# Patient Record
Sex: Female | Born: 1945 | Race: Black or African American | Hispanic: No | Marital: Single | Smoking: Former smoker
Health system: Southern US, Community
[De-identification: ages and names within clinical notes are randomized; demographics above are authoritative.]

## PROBLEM LIST (undated history)

## (undated) ENCOUNTER — Emergency Department: Discharge status: Absent without leave | Payer: Medicare Other

## (undated) DIAGNOSIS — M79606 Pain in leg, unspecified: Secondary | ICD-10-CM

## (undated) DIAGNOSIS — D509 Iron deficiency anemia, unspecified: Secondary | ICD-10-CM

## (undated) DIAGNOSIS — R609 Edema, unspecified: Secondary | ICD-10-CM

## (undated) DIAGNOSIS — R0602 Shortness of breath: Secondary | ICD-10-CM

## (undated) DIAGNOSIS — E1169 Type 2 diabetes mellitus with other specified complication: Secondary | ICD-10-CM

## (undated) DIAGNOSIS — F419 Anxiety disorder, unspecified: Secondary | ICD-10-CM

## (undated) DIAGNOSIS — E785 Hyperlipidemia, unspecified: Secondary | ICD-10-CM

## (undated) DIAGNOSIS — M109 Gout, unspecified: Secondary | ICD-10-CM

## (undated) DIAGNOSIS — K759 Inflammatory liver disease, unspecified: Secondary | ICD-10-CM

## (undated) DIAGNOSIS — I10 Essential (primary) hypertension: Secondary | ICD-10-CM

## (undated) DIAGNOSIS — I6529 Occlusion and stenosis of unspecified carotid artery: Secondary | ICD-10-CM

## (undated) DIAGNOSIS — I739 Peripheral vascular disease, unspecified: Secondary | ICD-10-CM

## (undated) DIAGNOSIS — I2510 Atherosclerotic heart disease of native coronary artery without angina pectoris: Secondary | ICD-10-CM

## (undated) DIAGNOSIS — E669 Obesity, unspecified: Secondary | ICD-10-CM

## (undated) DIAGNOSIS — M1990 Unspecified osteoarthritis, unspecified site: Secondary | ICD-10-CM

## (undated) DIAGNOSIS — N183 Chronic kidney disease, stage 3 (moderate): Secondary | ICD-10-CM

## (undated) DIAGNOSIS — IMO0002 Reserved for concepts with insufficient information to code with codable children: Secondary | ICD-10-CM

## (undated) DIAGNOSIS — K5900 Constipation, unspecified: Secondary | ICD-10-CM

## (undated) HISTORY — DX: Atherosclerotic heart disease of native coronary artery without angina pectoris: I25.10

## (undated) HISTORY — DX: Reserved for concepts with insufficient information to code with codable children: IMO0002

## (undated) HISTORY — DX: Unspecified osteoarthritis, unspecified site: M19.90

## (undated) HISTORY — DX: Peripheral vascular disease, unspecified: I73.9

## (undated) HISTORY — DX: Essential (primary) hypertension: I10

## (undated) HISTORY — PX: OTHER SURGICAL HISTORY: SHX169

## (undated) HISTORY — PX: RETINAL DETACHMENT SURGERY: SHX105

## (undated) HISTORY — DX: Obesity, unspecified: E66.9

## (undated) HISTORY — DX: Occlusion and stenosis of unspecified carotid artery: I65.29

## (undated) HISTORY — DX: Type 2 diabetes mellitus with other specified complication: E11.69

## (undated) HISTORY — DX: Edema, unspecified: R60.9

## (undated) HISTORY — PX: BREAST EXCISIONAL BIOPSY: SUR124

## (undated) HISTORY — PX: BREAST SURGERY: SHX581

## (undated) HISTORY — PX: COLONOSCOPY W/ POLYPECTOMY: SHX1380

## (undated) HISTORY — DX: Chronic kidney disease, stage 3 (moderate): N18.3

## (undated) HISTORY — PX: EYE SURGERY: SHX253

## (undated) HISTORY — DX: Pain in leg, unspecified: M79.606

## (undated) HISTORY — DX: Hyperlipidemia, unspecified: E78.5

---

## 1997-11-24 ENCOUNTER — Other Ambulatory Visit: Admission: RE | Admit: 1997-11-24 | Discharge: 1997-11-24 | Payer: Self-pay | Admitting: Internal Medicine

## 1997-11-24 ENCOUNTER — Other Ambulatory Visit: Admission: RE | Admit: 1997-11-24 | Discharge: 1997-11-24 | Payer: Self-pay

## 1998-06-20 ENCOUNTER — Encounter: Payer: Self-pay | Admitting: Internal Medicine

## 1998-06-20 ENCOUNTER — Ambulatory Visit (HOSPITAL_COMMUNITY): Admission: RE | Admit: 1998-06-20 | Discharge: 1998-06-20 | Payer: Self-pay | Admitting: Internal Medicine

## 1998-11-25 ENCOUNTER — Other Ambulatory Visit: Admission: RE | Admit: 1998-11-25 | Discharge: 1998-11-25 | Payer: Self-pay | Admitting: Internal Medicine

## 1999-06-23 ENCOUNTER — Encounter: Payer: Self-pay | Admitting: Internal Medicine

## 1999-06-23 ENCOUNTER — Ambulatory Visit (HOSPITAL_COMMUNITY): Admission: RE | Admit: 1999-06-23 | Discharge: 1999-06-23 | Payer: Self-pay | Admitting: Internal Medicine

## 2000-07-04 ENCOUNTER — Encounter: Payer: Self-pay | Admitting: Internal Medicine

## 2000-07-04 ENCOUNTER — Ambulatory Visit (HOSPITAL_COMMUNITY): Admission: RE | Admit: 2000-07-04 | Discharge: 2000-07-04 | Payer: Self-pay | Admitting: Internal Medicine

## 2000-07-19 ENCOUNTER — Encounter: Admission: RE | Admit: 2000-07-19 | Discharge: 2000-07-19 | Payer: Self-pay | Admitting: Internal Medicine

## 2000-07-19 ENCOUNTER — Encounter: Payer: Self-pay | Admitting: Internal Medicine

## 2000-11-28 ENCOUNTER — Other Ambulatory Visit: Admission: RE | Admit: 2000-11-28 | Discharge: 2000-11-28 | Payer: Self-pay | Admitting: Internal Medicine

## 2001-07-07 ENCOUNTER — Encounter: Payer: Self-pay | Admitting: Internal Medicine

## 2001-07-07 ENCOUNTER — Encounter: Admission: RE | Admit: 2001-07-07 | Discharge: 2001-07-07 | Payer: Self-pay | Admitting: Internal Medicine

## 2001-12-08 ENCOUNTER — Other Ambulatory Visit: Admission: RE | Admit: 2001-12-08 | Discharge: 2001-12-08 | Payer: Self-pay | Admitting: Internal Medicine

## 2002-07-20 ENCOUNTER — Encounter: Admission: RE | Admit: 2002-07-20 | Discharge: 2002-07-20 | Payer: Self-pay | Admitting: Internal Medicine

## 2002-07-20 ENCOUNTER — Encounter: Payer: Self-pay | Admitting: Internal Medicine

## 2003-08-17 ENCOUNTER — Encounter: Admission: RE | Admit: 2003-08-17 | Discharge: 2003-08-17 | Payer: Self-pay | Admitting: Internal Medicine

## 2004-09-06 ENCOUNTER — Encounter: Admission: RE | Admit: 2004-09-06 | Discharge: 2004-09-06 | Payer: Self-pay | Admitting: Internal Medicine

## 2005-09-11 ENCOUNTER — Encounter: Admission: RE | Admit: 2005-09-11 | Discharge: 2005-09-11 | Payer: Self-pay | Admitting: Internal Medicine

## 2006-09-17 ENCOUNTER — Encounter: Admission: RE | Admit: 2006-09-17 | Discharge: 2006-09-17 | Payer: Self-pay | Admitting: Internal Medicine

## 2007-03-19 ENCOUNTER — Inpatient Hospital Stay (HOSPITAL_COMMUNITY): Admission: AD | Admit: 2007-03-19 | Discharge: 2007-03-22 | Payer: Self-pay | Admitting: Internal Medicine

## 2008-04-27 ENCOUNTER — Encounter: Admission: RE | Admit: 2008-04-27 | Discharge: 2008-04-27 | Payer: Self-pay | Admitting: Internal Medicine

## 2009-05-04 ENCOUNTER — Encounter: Admission: RE | Admit: 2009-05-04 | Discharge: 2009-05-04 | Payer: Self-pay | Admitting: Internal Medicine

## 2009-11-08 ENCOUNTER — Ambulatory Visit: Payer: Self-pay | Admitting: Vascular Surgery

## 2009-11-15 ENCOUNTER — Ambulatory Visit (HOSPITAL_COMMUNITY): Admission: RE | Admit: 2009-11-15 | Discharge: 2009-11-15 | Payer: Self-pay | Admitting: Surgery

## 2009-11-15 ENCOUNTER — Ambulatory Visit: Payer: Self-pay | Admitting: Surgery

## 2009-11-22 ENCOUNTER — Ambulatory Visit: Payer: Self-pay | Admitting: Vascular Surgery

## 2009-11-22 ENCOUNTER — Encounter: Admission: RE | Admit: 2009-11-22 | Discharge: 2009-11-22 | Payer: Self-pay | Admitting: Vascular Surgery

## 2009-11-28 HISTORY — PX: CARDIOVASCULAR STRESS TEST: SHX262

## 2009-11-29 ENCOUNTER — Ambulatory Visit: Payer: Self-pay | Admitting: Vascular Surgery

## 2009-11-29 ENCOUNTER — Encounter: Admission: RE | Admit: 2009-11-29 | Discharge: 2009-11-29 | Payer: Self-pay | Admitting: Cardiology

## 2009-12-01 ENCOUNTER — Ambulatory Visit: Payer: Self-pay | Admitting: Vascular Surgery

## 2009-12-01 ENCOUNTER — Encounter: Payer: Self-pay | Admitting: Cardiology

## 2009-12-01 ENCOUNTER — Inpatient Hospital Stay (HOSPITAL_COMMUNITY): Admission: RE | Admit: 2009-12-01 | Discharge: 2009-12-02 | Payer: Self-pay | Admitting: Cardiology

## 2009-12-01 ENCOUNTER — Encounter: Payer: Self-pay | Admitting: Cardiothoracic Surgery

## 2009-12-01 HISTORY — PX: CARDIAC CATHETERIZATION: SHX172

## 2009-12-01 HISTORY — PX: TRANSTHORACIC ECHOCARDIOGRAM: SHX275

## 2009-12-08 ENCOUNTER — Inpatient Hospital Stay (HOSPITAL_COMMUNITY)
Admission: RE | Admit: 2009-12-08 | Discharge: 2009-12-15 | Payer: Self-pay | Admitting: Thoracic Surgery (Cardiothoracic Vascular Surgery)

## 2009-12-08 HISTORY — PX: CORONARY ARTERY BYPASS GRAFT: SHX141

## 2009-12-19 ENCOUNTER — Ambulatory Visit: Payer: Self-pay | Admitting: Thoracic Surgery (Cardiothoracic Vascular Surgery)

## 2009-12-26 ENCOUNTER — Ambulatory Visit: Payer: Self-pay | Admitting: Thoracic Surgery (Cardiothoracic Vascular Surgery)

## 2010-02-06 ENCOUNTER — Ambulatory Visit: Payer: Self-pay | Admitting: Thoracic Surgery (Cardiothoracic Vascular Surgery)

## 2010-02-06 ENCOUNTER — Encounter
Admission: RE | Admit: 2010-02-06 | Discharge: 2010-02-06 | Payer: Self-pay | Admitting: Thoracic Surgery (Cardiothoracic Vascular Surgery)

## 2010-02-23 ENCOUNTER — Ambulatory Visit: Payer: Self-pay | Admitting: Cardiology

## 2010-03-17 ENCOUNTER — Ambulatory Visit: Payer: Self-pay | Admitting: Cardiology

## 2010-06-28 ENCOUNTER — Ambulatory Visit: Payer: Self-pay | Admitting: Cardiology

## 2010-07-29 ENCOUNTER — Other Ambulatory Visit: Payer: Self-pay | Admitting: Internal Medicine

## 2010-07-29 DIAGNOSIS — Z1231 Encounter for screening mammogram for malignant neoplasm of breast: Secondary | ICD-10-CM

## 2010-07-29 DIAGNOSIS — Z1239 Encounter for other screening for malignant neoplasm of breast: Secondary | ICD-10-CM

## 2010-08-22 ENCOUNTER — Ambulatory Visit: Payer: Self-pay

## 2010-08-30 DIAGNOSIS — Z01810 Encounter for preprocedural cardiovascular examination: Secondary | ICD-10-CM

## 2010-09-25 LAB — BASIC METABOLIC PANEL
BUN: 12 mg/dL (ref 6–23)
BUN: 22 mg/dL (ref 6–23)
CO2: 24 mEq/L (ref 19–32)
CO2: 26 mEq/L (ref 19–32)
CO2: 28 mEq/L (ref 19–32)
CO2: 28 mEq/L (ref 19–32)
CO2: 29 mEq/L (ref 19–32)
Calcium: 8.1 mg/dL — ABNORMAL LOW (ref 8.4–10.5)
Calcium: 8.3 mg/dL — ABNORMAL LOW (ref 8.4–10.5)
Calcium: 8.8 mg/dL (ref 8.4–10.5)
Calcium: 8.8 mg/dL (ref 8.4–10.5)
Chloride: 104 mEq/L (ref 96–112)
Chloride: 107 mEq/L (ref 96–112)
Chloride: 107 mEq/L (ref 96–112)
Chloride: 107 mEq/L (ref 96–112)
Chloride: 111 mEq/L (ref 96–112)
Chloride: 111 mEq/L (ref 96–112)
Chloride: 110 mEq/L (ref 96–112)
Creatinine, Ser: 0.82 mg/dL (ref 0.4–1.2)
Creatinine, Ser: 1.42 mg/dL — ABNORMAL HIGH (ref 0.4–1.2)
Creatinine, Ser: 1.5 mg/dL — ABNORMAL HIGH (ref 0.4–1.2)
Creatinine, Ser: 0.9 mg/dL (ref 0.4–1.2)
GFR calc Af Amer: 42 mL/min — ABNORMAL LOW (ref >60)
GFR calc Af Amer: 53 mL/min — ABNORMAL LOW (ref >60)
GFR calc Af Amer: 54 mL/min — ABNORMAL LOW (ref >60)
GFR calc Af Amer: >60 mL/min (ref >60)
GFR calc Af Amer: >60 mL/min (ref >60)
GFR calc non Af Amer: 30 mL/min — ABNORMAL LOW (ref >60)
GFR calc non Af Amer: >60 mL/min (ref >60)
Glucose, Bld: 127 mg/dL — ABNORMAL HIGH (ref 70–99)
Glucose, Bld: 158 mg/dL — ABNORMAL HIGH (ref 70–99)
Potassium: 4.4 mEq/L (ref 3.5–5.1)
Potassium: 4.9 mEq/L (ref 3.5–5.1)
Sodium: 138 mEq/L (ref 135–145)
Sodium: 141 mEq/L (ref 135–145)
Sodium: 142 mEq/L (ref 135–145)
Sodium: 143 mEq/L (ref 135–145)

## 2010-09-25 LAB — PROTIME-INR
INR: 0.99 (ref 0.00–1.49)
INR: 1.3 (ref 0.00–1.49)
Prothrombin Time: 13 seconds (ref 11.6–15.2)

## 2010-09-25 LAB — BLOOD GAS, ARTERIAL
Acid-Base Excess: 1.2 mmol/L (ref 0.0–2.0)
FIO2: 0.21 %
TCO2: 26.3 mmol/L (ref 0–100)
pCO2 arterial: 38.8 mmHg (ref 35.0–45.0)
pH, Arterial: 7.428 — ABNORMAL HIGH (ref 7.350–7.400)
pO2, Arterial: 82.7 mmHg (ref 80.0–100.0)

## 2010-09-25 LAB — HEPARIN LEVEL (UNFRACTIONATED): Heparin Unfractionated: 0.97 IU/mL — ABNORMAL HIGH (ref 0.30–0.70)

## 2010-09-25 LAB — CBC
HCT: 30 % — ABNORMAL LOW (ref 36.0–46.0)
HCT: 30.5 % — ABNORMAL LOW (ref 36.0–46.0)
HCT: 33.8 % — ABNORMAL LOW (ref 36.0–46.0)
Hemoglobin: 10.2 g/dL — ABNORMAL LOW (ref 12.0–15.0)
Hemoglobin: 10.2 g/dL — ABNORMAL LOW (ref 12.0–15.0)
Hemoglobin: 10.6 g/dL — ABNORMAL LOW (ref 12.0–15.0)
MCHC: 33.9 g/dL (ref 30.0–36.0)
MCHC: 34 g/dL (ref 30.0–36.0)
MCHC: 34.4 g/dL (ref 30.0–36.0)
MCV: 88.4 fL (ref 78.0–100.0)
MCV: 89.8 fL (ref 78.0–100.0)
MCV: 90.6 fL (ref 78.0–100.0)
MCV: 91.2 fL (ref 78.0–100.0)
Platelets: 165 10*3/uL (ref 150–400)
Platelets: 194 10*3/uL (ref 150–400)
Platelets: 292 10*3/uL (ref 150–400)
Platelets: 368 10*3/uL (ref 150–400)
RBC: 3.5 MIL/uL — ABNORMAL LOW (ref 3.87–5.11)
RBC: 3.76 MIL/uL — ABNORMAL LOW (ref 3.87–5.11)
RBC: 3.9 MIL/uL (ref 3.87–5.11)
RDW: 15.3 % (ref 11.5–15.5)
RDW: 15.9 % — ABNORMAL HIGH (ref 11.5–15.5)
RDW: 15.9 % — ABNORMAL HIGH (ref 11.5–15.5)
WBC: 14 10*3/uL — ABNORMAL HIGH (ref 4.0–10.5)
WBC: 14.8 10*3/uL — ABNORMAL HIGH (ref 4.0–10.5)
WBC: 13.7 10*3/uL — ABNORMAL HIGH (ref 4.0–10.5)

## 2010-09-25 LAB — GLUCOSE, CAPILLARY
Glucose-Capillary: 111 mg/dL — ABNORMAL HIGH (ref 70–99)
Glucose-Capillary: 117 mg/dL — ABNORMAL HIGH (ref 70–99)
Glucose-Capillary: 117 mg/dL — ABNORMAL HIGH (ref 70–99)
Glucose-Capillary: 126 mg/dL — ABNORMAL HIGH (ref 70–99)
Glucose-Capillary: 132 mg/dL — ABNORMAL HIGH (ref 70–99)
Glucose-Capillary: 134 mg/dL — ABNORMAL HIGH (ref 70–99)
Glucose-Capillary: 135 mg/dL — ABNORMAL HIGH (ref 70–99)
Glucose-Capillary: 145 mg/dL — ABNORMAL HIGH (ref 70–99)
Glucose-Capillary: 151 mg/dL — ABNORMAL HIGH (ref 70–99)
Glucose-Capillary: 158 mg/dL — ABNORMAL HIGH (ref 70–99)
Glucose-Capillary: 166 mg/dL — ABNORMAL HIGH (ref 70–99)
Glucose-Capillary: 168 mg/dL — ABNORMAL HIGH (ref 70–99)
Glucose-Capillary: 211 mg/dL — ABNORMAL HIGH (ref 70–99)
Glucose-Capillary: 81 mg/dL (ref 70–99)
Glucose-Capillary: 94 mg/dL (ref 70–99)
Glucose-Capillary: 112 mg/dL — ABNORMAL HIGH (ref 70–99)
Glucose-Capillary: 123 mg/dL — ABNORMAL HIGH (ref 70–99)
Glucose-Capillary: 247 mg/dL — ABNORMAL HIGH (ref 70–99)
Glucose-Capillary: 265 mg/dL — ABNORMAL HIGH (ref 70–99)

## 2010-09-25 LAB — POCT I-STAT 3, ART BLOOD GAS (G3+)
Acid-Base Excess: 2 mmol/L (ref 0.0–2.0)
Acid-base deficit: 2 mmol/L (ref 0.0–2.0)
Acid-base deficit: 2 mmol/L (ref 0.0–2.0)
Acid-base deficit: 3 mmol/L — ABNORMAL HIGH (ref 0.0–2.0)
Bicarbonate: 24 mEq/L (ref 20.0–24.0)
Bicarbonate: 25 mEq/L — ABNORMAL HIGH (ref 20.0–24.0)
O2 Saturation: 100 %
O2 Saturation: 91 %
Patient temperature: 36.5
Patient temperature: 36.6
TCO2: 23 mmol/L (ref 0–100)
TCO2: 26 mmol/L (ref 0–100)
pCO2 arterial: 41.2 mmHg (ref 35.0–45.0)
pCO2 arterial: 46.2 mmHg — ABNORMAL HIGH (ref 35.0–45.0)
pCO2 arterial: 47.5 mmHg — ABNORMAL HIGH (ref 35.0–45.0)
pH, Arterial: 7.336 — ABNORMAL LOW (ref 7.350–7.400)
pO2, Arterial: 321 mmHg — ABNORMAL HIGH (ref 80.0–100.0)
pO2, Arterial: 59 mmHg — ABNORMAL LOW (ref 80.0–100.0)
pO2, Arterial: 64 mmHg — ABNORMAL LOW (ref 80.0–100.0)
pO2, Arterial: 67 mmHg — ABNORMAL LOW (ref 80.0–100.0)

## 2010-09-25 LAB — URINE MICROSCOPIC-ADD ON

## 2010-09-25 LAB — POCT I-STAT 4, (NA,K, GLUC, HGB,HCT)
Glucose, Bld: 121 mg/dL — ABNORMAL HIGH (ref 70–99)
Glucose, Bld: 136 mg/dL — ABNORMAL HIGH (ref 70–99)
Glucose, Bld: 167 mg/dL — ABNORMAL HIGH (ref 70–99)
HCT: 20 % — ABNORMAL LOW (ref 36.0–46.0)
HCT: 23 % — ABNORMAL LOW (ref 36.0–46.0)
HCT: 27 % — ABNORMAL LOW (ref 36.0–46.0)
HCT: 28 % — ABNORMAL LOW (ref 36.0–46.0)
Hemoglobin: 6.8 g/dL — CL (ref 12.0–15.0)
Hemoglobin: 7.8 g/dL — ABNORMAL LOW (ref 12.0–15.0)
Hemoglobin: 9.2 g/dL — ABNORMAL LOW (ref 12.0–15.0)
Potassium: 3.6 mEq/L (ref 3.5–5.1)
Potassium: 3.7 mEq/L (ref 3.5–5.1)
Potassium: 4 mEq/L (ref 3.5–5.1)
Potassium: 4 mEq/L (ref 3.5–5.1)
Potassium: 5.1 mEq/L (ref 3.5–5.1)
Sodium: 136 mEq/L (ref 135–145)

## 2010-09-25 LAB — APTT
aPTT: 26 seconds (ref 24–37)
aPTT: 31 seconds (ref 24–37)

## 2010-09-25 LAB — URINALYSIS, ROUTINE W REFLEX MICROSCOPIC
Hgb urine dipstick: NEGATIVE
Nitrite: NEGATIVE
Urobilinogen, UA: 0.2 mg/dL (ref 0.0–1.0)
pH: 5.5 (ref 5.0–8.0)

## 2010-09-25 LAB — CREATININE, SERUM
Creatinine, Ser: 0.77 mg/dL (ref 0.4–1.2)
GFR calc Af Amer: >60 mL/min (ref >60)
GFR calc non Af Amer: >60 mL/min (ref >60)

## 2010-09-25 LAB — POCT I-STAT, CHEM 8
BUN: 15 mg/dL (ref 6–23)
Calcium, Ion: 1.15 mmol/L (ref 1.12–1.32)
HCT: 35 % — ABNORMAL LOW (ref 36.0–46.0)
Hemoglobin: 11.9 g/dL — ABNORMAL LOW (ref 12.0–15.0)
Sodium: 141 mEq/L (ref 135–145)
TCO2: 22 mmol/L (ref 0–100)

## 2010-09-25 LAB — TYPE AND SCREEN: ABO/RH(D): A POS

## 2010-09-25 LAB — URINALYSIS, MICROSCOPIC ONLY
Glucose, UA: NEGATIVE mg/dL
Hgb urine dipstick: NEGATIVE
Ketones, ur: NEGATIVE mg/dL
Protein, ur: NEGATIVE mg/dL
pH: 5.5 (ref 5.0–8.0)

## 2010-09-25 LAB — COMPREHENSIVE METABOLIC PANEL
AST: 21 U/L (ref 0–37)
Calcium: 9.3 mg/dL (ref 8.4–10.5)
Creatinine, Ser: 0.82 mg/dL (ref 0.4–1.2)
Sodium: 140 mEq/L (ref 135–145)

## 2010-09-25 LAB — HEMOGLOBIN A1C
Hgb A1c MFr Bld: 7.2 % — ABNORMAL HIGH (ref <5.7)
Mean Plasma Glucose: 160 mg/dL — ABNORMAL HIGH (ref <117)

## 2010-09-25 LAB — MAGNESIUM: Magnesium: 2.9 mg/dL — ABNORMAL HIGH (ref 1.5–2.5)

## 2010-09-25 LAB — ABO/RH: ABO/RH(D): A POS

## 2010-09-25 LAB — SURGICAL PCR SCREEN
MRSA, PCR: NEGATIVE
Staphylococcus aureus: NEGATIVE

## 2010-09-26 LAB — POCT I-STAT, CHEM 8
BUN: 37 mg/dL — ABNORMAL HIGH (ref 6–23)
Chloride: 111 mEq/L (ref 96–112)
HCT: 33 % — ABNORMAL LOW (ref 36.0–46.0)
Sodium: 143 mEq/L (ref 135–145)
TCO2: 24 mmol/L (ref 0–100)

## 2010-10-10 ENCOUNTER — Ambulatory Visit: Payer: Self-pay

## 2010-11-01 ENCOUNTER — Ambulatory Visit
Admission: RE | Admit: 2010-11-01 | Discharge: 2010-11-01 | Disposition: A | Discharge status: Home / Self Care | Payer: BC Managed Care – PPO | Source: Ambulatory Visit | Attending: Internal Medicine | Admitting: Internal Medicine

## 2010-11-01 DIAGNOSIS — Z1231 Encounter for screening mammogram for malignant neoplasm of breast: Secondary | ICD-10-CM

## 2010-11-02 DIAGNOSIS — Z0271 Encounter for disability determination: Secondary | ICD-10-CM

## 2010-11-21 NOTE — Assessment & Plan Note (Signed)
OFFICE VISIT   Norma [name], Norma [name]  DOB:  [birth year]                                        December 19, 2009  CHART #:  [id number]   HISTORY:  The patient is a 65 year old morbidly obese black female who  recently underwent coronary artery bypass grafting x2 on December 08, 2009,  for left main coronary artery disease.  She had an endoscopic vein  harvest from the right leg and during her hospitalization, the wound  became dehisced requiring resuture.  We brought her into the office  today to check on the healing of this and her overall recovery.  She had  some bronchitis during her postoperative course and was sent home on  some oral antibiotics.  Currently, she reports no particular  difficulties related to the incisions.  She does have significant  bilateral lower extremity swelling.  She complains of some ongoing back  discomfort.   PHYSICAL EXAMINATION:  Vital Signs:  Blood pressure 164/60, pulse 70,  respirations 16, oxygen saturation is 93% on room air.  Pulmonary:  Clear breath sounds throughout without wheeze.  Cardiac:  Regular rate  and rhythm.  No murmurs, gallops or rubs.  Incisions are healing well  without evidence of infection.  Extremities:  Bilateral lower extremity  pitting edema.   ASSESSMENT:  This visit was primarily for the wound check.  These  incisions are healing well.  She was also felt to be recovering well  from her bronchitis.  She had significant wheezing during her  hospitalization but none currently and has them had minimal at home.  We  will continue her b.i.d. Lasix 40 mg.  I removed the sutures from the  chest tube sites and they appear to be healing well.  I will leave the  sutures in the right endoscopic vein harvest site one additional week  and  we will see her in the office and at that time probably discontinue them  if it appears to be healed adequately.   John Giovanni, P.A.-C.   [name]  D:  12/19/2009  T:  12/20/2009   Job:  [id number]   cc:   Valentina Gu. Roxy Manns, M.D.  Peter M. Martinique, M.D.

## 2010-11-21 NOTE — Consult Note (Signed)
NEW PATIENT CONSULTATION   Norma [name], Norma [name]  DOB:  May 04, 1946                                       11/08/2009  [id number]   This is a new patient consultation.  The patient is a 65 year old female  referred by Dr. Rhona Raider for severe claudication symptoms left leg worse  than right.  She has been seeing Dr. Rhona Raider for degenerative joint  disease in her knees and has had injections earlier today in both knees.  I have reviewed his records dating back a few months this year relative  to her joint disease.  She obtained lower extremity arterial Doppler  studies by [facility] and I reviewed this study today and this  reveals aortoiliac, femoral, popliteal and tibial occlusive disease in  both lower extremities left worse than right with an ABI of 0.28 on the  left and 0.65 on the right.  She states that she is only able to  ambulate about a quarter of a block before having to stop because of  pain in her left calf soon to be followed by pain in the right calf.  She can rest for a few minutes and then proceed but then quickly has to  stop again.  She occasionally has some rest discomfort in her left leg  and also some numbness in both feet left worse than right.  She has no  history of nonhealing ulcers, infection or gangrene.  She also has pain  in her lower back when she walks but the leg pain is more limiting than  the back pain.   CHRONIC MEDICAL PROBLEMS:  1. Type 1 diabetes mellitus.  2. Hypertension.  3. Hyperlipidemia.  4. 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  5. Negative for coronary artery disease, COPD or stroke.   FAMILY HISTORY:  Negative for coronary artery disease, diabetes and  stroke.   SOCIAL HISTORY:  She is single, has two children, works as a Company secretary.  She has not smoked since 1993.  Does not use alcohol.   REVIEW OF SYSTEMS:  Negative for chest pain, dyspnea on exertion, PND,  orthopnea, bronchitis, hemoptysis, wheezing.   No GI symptoms or GU  symptoms.  Does have arthritis and joint pain as noted.  No history of  DVT or thrombophlebitis.  All other systems in review of systems are  negative.   PHYSICAL EXAM:  Vital signs:  Blood pressure 180/72, heart rate 56,  respirations 16.  General:  She is an obese middle-aged female who is in  no apparent distress.  She is alert, oriented x3.  HEENT:  Exam normal.  EOMs intact.  Chest:  Is clear to auscultation.  No rhonchi or wheezing.  Cardiovascular:  Reveals regular rhythm.  No murmurs.  Carotid pulses  3+.  No audible bruits.  There is no distal edema.  Abdomen:  Soft and  obese.  No palpable masses.  Musculoskeletal:  Exam free of major  deformities.  Neurologic:  Exam is normal.  Slight decreased sensation  in the feet.  Skin:  Free of rashes.  Lower extremities:  Exam reveals  one to two plus femoral pulses bilaterally.  No popliteal or distal  pulses.   Today I have reviewed her arterial studies performed at [place]  and feel that she does have multilevel disease which is limiting her  quite  severely.  We will proceed with an angiogram to be performed by  Dr. Trula Slade on May 10 and if PTA or stenting is feasible then he will  proceed at that time.  If not we will then discuss what options are  available to help relieve this lady's symptoms.     Norma [name], M.D.  Electronically Signed   JDL/MEDQ  D:  11/08/2009  T:  11/09/2009  Job:  [id number]   cc:   [name], M.D.  [name], M.D.

## 2010-11-21 NOTE — Assessment & Plan Note (Signed)
OFFICE VISIT   KACHINA, JANUSZEWSKI  DOB:  08-10-1945                                        February 06, 2010  CHART #:  [id number]   HISTORY:  The patient returns for follow up of coronary artery disease.  She underwent coronary artery bypass grafting x2 on December 08, 2009, for  left main disease.  She has numerous medical problems that have been  outlined at length previously.  Despite her issues, the patient is doing  remarkably well.  She is being followed carefully by Dr. Martinique and Dr.  Karlton Lemon who attend to her long-term medical needs.  She returns to our  office for further followup today.  She looks great and feels well.  The  patient has spent a fair amount of time working on her diet to help her  continue to lose weight.  Her exercise tolerance has continued to  gradually improve ever since surgery.  She has no longer had any  significant soreness in her chest.  She is not having shortness of  breath.  She claims that her blood sugars have been under fairly good  control.  She has no complaints.  She does still content to right lower  leg saphenous vein harvest wound.  This incision separated early  postoperatively and has been left to heal by secondary intent.  She has  not had any problems with infection or purulent drainage at all, and she  is to dressing her wound daily keeping it clean and packing it with  gauze.  She has not had any problems with this.   PHYSICAL EXAMINATION:  Notable for well-appearing morbidly obese female  with blood pressure 181/73, pulse 64, oxygen saturation 96% on room air.  Examination of the chest demonstrates a median sternotomy scar that is  healing nicely.  The sternum is stable on palpation.  Breath sounds are  clear to auscultation and symmetrical bilaterally.  No wheezes, rales,  or rhonchi are noted.  Cardiovascular exam includes regular rate and  rhythm.  No murmurs, rubs, or gallops are appreciated.  The abdomen is  soft, nontender.  The extremities are warm and well perfused.  The right  lower leg endoscopic saphenous vein harvest incision is open and  granulating slowly.  The incision has contracted a little bit since I  last examined it.  There is no surrounding cellulitis and the wound  itself is quite clean.  The remainder of her physical exam is  unremarkable.   IMPRESSION:  Overall, the patient has done remarkably well following  coronary artery bypass grafting.  She does have very slowly healing open  wound on her right lower leg from endoscopic vein harvest.  There is no  sign of any wound infection, and the patient is doing well with local  wound care.  Her multiple medical problems are being managed quite  aggressively by Dr. Karlton Lemon and Dr. Martinique.   PLAN:  We will plan to see the patient back in 8 weeks for follow up to  check her right lower leg wound.  All of her questions have been  addressed.   Valentina Gu. Roxy Manns, M.D.  Electronically Signed   CHO/MEDQ  D:  02/06/2010  T:  02/07/2010  Job:  [id number]   cc:   Peter M. Martinique, M.D.  [name],  M.D.  Nelda Severe. Kellie Simmering, M.D.

## 2010-11-21 NOTE — Assessment & Plan Note (Signed)
OFFICE VISIT   Norma [name], Norma [name]  DOB:  05/10/1946                                       11/29/2009  [id number]   The patient returns today to further discuss surgical options for her  severe claudication symptoms in the left leg which limit her walking  about 20-30 yards.  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  We have thoroughly reviewed these studies and I have discussed  it with multiple associates and all agree that she needs endarterectomy  of her suprarenal aorta from the celiac axis down to below the renal  arteries and aortobifemoral bypass graft to relieve her symptoms.  She  had a Cardiolite performed by Dr. Peter Martinique a few days ago which is  abnormal and he will be evaluating her today for possible cardiac cath  in the next day or two to see if we can proceed with the surgery on this  Friday May 27.  She denies any chest pain but does have mild dyspnea on  exertion.  She has no history of coronary artery disease or myocardial  infarction.  She does not have rest pain at the present time in the left  foot but does have some tingling on occasion.  She did develop a  contrast reaction after the CT scan last week with some itching and has  been taking Benadryl for that which has dramatically improved her  symptoms.   PHYSICAL EXAMINATION:  Blood pressure 166/50, heart rate 61 and  temperature 98.5.  Her abdomen is obese.  She has absent femoral pulses.  Chest clear to auscultation.   We will await the results of the cardiology evaluation by Dr. Martinique  later today and the results of the cardiac cath to be done in the next  day or two to determine the timing of her surgery.     Nelda Severe Kellie Simmering, M.D.  Electronically Signed   JDL/MEDQ  D:  11/29/2009  T:  11/30/2009  Job:  [id number]

## 2010-11-21 NOTE — Discharge Summary (Signed)
NAME:  VENBA, SAMADI                ACCOUNT NO.:  [id number]   MEDICAL RECORD NO.:  [id number]          PATIENT TYPE:  INP   LOCATION:  5506                         FACILITY:  [facility]   PHYSICIAN:  Cyril Mourning, D.O.    DATE OF BIRTH:  1946/05/01   DATE OF ADMISSION:  03/19/2007  DATE OF DISCHARGE:  03/22/2007                               DISCHARGE SUMMARY   PRIMARY CARE PHYSICIAN:  Dr. Willey Blade   FINAL DIAGNOSIS:  Diabetic ketoacidosis.   ADDITIONAL DIAGNOSES:  1. Acute renal failure, resolved, secondary to volume depletion and      dehydration.  2. Nausea, vomiting, resolved, status post resolution of the above.  3. Poorly controlled diabetes.  Her hemoglobin A1c was unable to be      calculated.  I would recommend checking this in the outpatient      setting once she has stabilized on her new regimen.   MEDICATIONS ON DISCHARGE:  1. She has achieved stable control on a regimen of Lantus 34 units      b.i.d., though still some continued hyperglycemia in the 200 range.      She is being discharged on an increased dose of Lantus at 36 units      subcu b.i.d.  2. In addition, she will cover her meals with NovoLog 4 units q.a.c.      This is by no means a final dosage for Ms. Lobosco.  It is      anticipated she will need fine tuning and adjustment in the      outpatient setting including diabetes education.  3. We are, for now, discontinuing her Diovan due to recent acute renal      failure.  4. She can resume her lisinopril at 20 mg daily with recommendations      to have a followup basic metabolic panel within the next month by      her primary care physician to assure that her creatinine remains      stable.  5. Lipitor 10 mg daily.  6. We have asked her to discontinue Byetta and Humulin-N for now.  7. Naprosyn.  It was recommended that she not combine this medication      with lisinopril or Diovan, as this also compounds and increases her      risk of going into acute  renal failure.  8. Amaryl, we are discontinuing now as a potential for hypoglycemia,      especially in the adjustment period for her outpatient insulin      regimen.   DISPOSITION:  Atr this time, Ms. Causer is felt to be medically stable  for transition home.  She is hemodynamically and medically stable.   LABORATORY STUDIES DURING THIS HOSPITALIZATION:  Upon arrival, Ms.  Steimle blood sugar was 1045, and she had an anion gap metabolic  acidosis with a corrected sodium of 151, chloride of 85, and a CO2 of  19, and her creatinine was 2.5.  This subsequently responded to IV  hydration with a final BUN of 44 and a creatinine of 1.27, sodium 138,  potassium 4.2.  Her hemoglobin was 11, WBC 10, and platelet count was  192.  Her hemoglobin A1c as noted above could not be calculated.  Her  next EKG revealed a normal sinus rhythm.  There was some diffuse T-wave  inversions.  Cardiac markers were not suggestive of an acute ischemic  event.   I would recommend that Ms. Batrez undergo an outpatient risk  stratification study, including a stress test which can be arranged by  her primary care physician in the outpatient setting.   HISTORY OF PRESENTING ILLNESS:  Ms. Rubert was admitted directly to 6700  from her primary care physician's office after having presented to the  clinic with abdominal discomfort that has been persisting for about a  week.  She had had some nausea and vomiting and was unable to keep down  solid foods or liquids at the clinic.  She was noted to have an elevated  glucose of 500.  She was provided with 20 units of NovoLog and  transferred directly to the 6700 unit.   HOSPITAL COURSE:  Initial laboratories were suggestive of a diabetic  ketoacidosis with anion gap greater than 40 with sodium corrected for  glucose of 151.  In any event, she was placed on a Glucomander and  transferred to the step-down unit where she received an insulin drip  until anion gap closure.   This continued, and her renal function  improved steadily, and she was started on Lantus and NovoLog regimen for  simplicity of regimen which she can transition to the home setting, and  I am recommending that she undergo further outpatient titration of her  insulin therapy and undergo diabetes education in the outpatient  setting.      Cyril Mourning, D.O.  Electronically Signed     ESS/MEDQ  D:  03/22/2007  T:  03/22/2007  Job:  [id number]   cc:   [name], M.D.

## 2010-11-21 NOTE — Assessment & Plan Note (Signed)
OFFICE VISIT   REYES, ROSKELLEY  DOB:  [birth year]                                       11/22/2009  [id number]   The patient returns today for further discussion regarding her severe  lower extremity vascular occlusive disease and severe claudication, left  worse than right.  I evaluated her on May 3 for this problem.  She had  an angiogram performed last week by Dr. Trula Slade which I have reviewed.  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  She  does need an inflow procedure but this is complicated by the proximal  extension of the disease and her obesity which will make exposure even  more difficult.  She is not having rest pain or history of nonhealing  ulcers at this time.   CHRONIC MEDICAL PROBLEMS:  1. Type 1 diabetes mellitus.  2. Hypertension.  3. Hyperlipidemia.  4. 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  5. Negative for coronary artery disease, COPD or stroke.   FAMILY HISTORY:  Negative for coronary artery disease, diabetes and  stroke.   SOCIAL HISTORY:  She quit smoking in '93, does not use alcohol.   REVIEW OF SYSTEMS:  Negative for chest pain, dyspnea on exertion.  Does  have severe claudication at a quarter of a block.  No bronchitis,  hemoptysis, wheezing.  Denies any GI or GU symptoms.  Does have  arthritis and joint pain.   PHYSICAL EXAMINATION:  Vital signs:  Blood pressure is 140/65, heart  rate 56, temperature 98.  General:  She is an obese [place] American  female who is in no apparent distress.  She is alert and oriented x3.  Neck:  Supple, 3+ carotid  pulses palpable.  Chest:  Clear to  auscultation.  HEENT:  Exam is normal.  EOMs intact.  Cardiovascular:  Regular rhythm.  No murmurs.  Abdomen:  Obese.  No palpable masses.  She  has absent femoral pulses bilaterally with absent distal pulses.   She also has a CT angiogram performed by [facility] which I  reviewed today to look at her runoff which as I noted reveals she does  have patent superficial femoral and popliteal arteries bilaterally.   This is a very complex situation which will need aortobifemoral bypass  grafting and possible treatment of the suprarenal aorta to some degree.  We will obtain a Cardiolite study to stratify her cardiac risk.  She  will return in 1 week for further discussion.     Nelda Severe Kellie Simmering, M.D.  Electronically Signed   JDL/MEDQ  D:  11/22/2009  T:  11/23/2009  Job:  [id number]   cc:   [name], M.D.

## 2010-11-21 NOTE — Assessment & Plan Note (Signed)
OFFICE VISIT   Norma [name], Norma [name]  DOB:  1945-08-11                                        December 26, 2009  CHART #:  [id number]   HISTORY:  The patient comes in today for a wound recheck and suture  removal.  She is status post coronary artery bypass grafting x2 on December 08, 2009.  Her right lower extremity EVH site dehisced postoperatively,  requiring resuturing.  She was seen in our office 1 week ago and  everything was healing well.  She was still edematous and was continued  on Lasix 40 mg b.i.d. for an additional week.  Since her last visit, she  has not really been very mobile.  According to the patient and her  daughter, she is walking minimally, but is keeping her legs elevated.  She continues to have significant lower extremity edema, although she  does feel like her weight is slowly trending downward.  She is having no  chest pain or shortness of breath.  She has an appointment with Dr.  Martinique in 1 week.  She still requires the pain medication at times  during the day and at night to help her rest.   PHYSICAL EXAMINATION:  Vital Signs:  Blood pressure is 148/64, pulse 67,  respirations 18, and O2 sat 93% on room air.  Chest:  Her sternal and  chest tube incisions have all healed well.  Sternum is stable to  palpation.  Heart:  Regular rate and rhythm without murmurs, rubs, or  gallops.  Lungs:  Clear, although slightly diminished breath sounds in  the bases bilaterally.  Lower Extremities:  Persistent pitting edema  bilaterally.  Her right lower extremity EVH site is weeping a small  amount of serous fluid.  The wound is approximating and the sutures are  removed.  There is no surrounding erythema.   ASSESSMENT/PLAN:  The patient is stable status post coronary artery  bypass graft x2.  Dr. Roxy Manns looked her wound today and spoke with the  patient while she was in the office.  We will plan to keep her on Lasix  40 mg b.i.d. indefinitely and I have given  her a prescription for the  Lasix as well as potassium 20 mEq b.i.d. 1 month supply with 1 refill of  each.  Also, she has requested a refill on oxycodone and I have given  her #40 tablets with no refills.  Dr. Roxy Manns would like to see her back in  6 weeks for followup with a chest x-ray and she may call in the interim  if she experiences any problems or has questions.  She will follow up as  directed with Dr. Martinique.  We have encouraged her to increase her activity and ambulate more  frequently and to try to increase the amount of walking she does daily.   Valentina Gu. Roxy Manns, M.D.  Electronically Signed   GC/MEDQ  D:  12/26/2009  T:  12/27/2009  Job:  [id number]   cc:   Peter M. Martinique, M.D.

## 2011-02-20 ENCOUNTER — Inpatient Hospital Stay (HOSPITAL_COMMUNITY)
Admission: EM | Admit: 2011-02-20 | Discharge: 2011-02-22 | DRG: 305 | Disposition: A | Discharge status: Home / Self Care | Payer: Medicare Other | Attending: Internal Medicine | Admitting: Internal Medicine

## 2011-02-20 DIAGNOSIS — E119 Type 2 diabetes mellitus without complications: Secondary | ICD-10-CM | POA: Diagnosis present

## 2011-02-20 DIAGNOSIS — D509 Iron deficiency anemia, unspecified: Secondary | ICD-10-CM | POA: Diagnosis present

## 2011-02-20 DIAGNOSIS — Z951 Presence of aortocoronary bypass graft: Secondary | ICD-10-CM

## 2011-02-20 DIAGNOSIS — Z794 Long term (current) use of insulin: Secondary | ICD-10-CM

## 2011-02-20 DIAGNOSIS — Z23 Encounter for immunization: Secondary | ICD-10-CM

## 2011-02-20 DIAGNOSIS — I739 Peripheral vascular disease, unspecified: Secondary | ICD-10-CM | POA: Diagnosis present

## 2011-02-20 DIAGNOSIS — Z79899 Other long term (current) drug therapy: Secondary | ICD-10-CM

## 2011-02-20 DIAGNOSIS — Z7982 Long term (current) use of aspirin: Secondary | ICD-10-CM

## 2011-02-20 DIAGNOSIS — Z91041 Radiographic dye allergy status: Secondary | ICD-10-CM

## 2011-02-20 DIAGNOSIS — E785 Hyperlipidemia, unspecified: Secondary | ICD-10-CM | POA: Diagnosis present

## 2011-02-20 DIAGNOSIS — I10 Essential (primary) hypertension: Principal | ICD-10-CM | POA: Diagnosis present

## 2011-02-20 DIAGNOSIS — I498 Other specified cardiac arrhythmias: Secondary | ICD-10-CM | POA: Diagnosis present

## 2011-02-20 DIAGNOSIS — Z882 Allergy status to sulfonamides status: Secondary | ICD-10-CM

## 2011-02-20 DIAGNOSIS — I2510 Atherosclerotic heart disease of native coronary artery without angina pectoris: Secondary | ICD-10-CM | POA: Diagnosis present

## 2011-02-20 LAB — DIFFERENTIAL
Basophils Relative: 0 % (ref 0–1)
Eosinophils Absolute: 0.1 10*3/uL (ref 0.0–0.7)
Monocytes Absolute: 0.4 10*3/uL (ref 0.1–1.0)
Monocytes Relative: 6 % (ref 3–12)

## 2011-02-20 LAB — COMPREHENSIVE METABOLIC PANEL
ALT: 24 U/L (ref 0–35)
AST: 22 U/L (ref 0–37)
Calcium: 9.9 mg/dL (ref 8.4–10.5)
GFR calc Af Amer: >60 mL/min (ref >60)
Sodium: 142 mEq/L (ref 135–145)
Total Protein: 7.1 g/dL (ref 6.0–8.3)

## 2011-02-20 LAB — CBC
MCH: 27 pg (ref 26.0–34.0)
MCHC: 31.5 g/dL (ref 30.0–36.0)
Platelets: 293 10*3/uL (ref 150–400)

## 2011-02-20 LAB — CK TOTAL AND CKMB (NOT AT ARMC): Relative Index: 2.8 — ABNORMAL HIGH (ref 0.0–2.5)

## 2011-02-20 LAB — GLUCOSE, CAPILLARY: Glucose-Capillary: 199 mg/dL — ABNORMAL HIGH (ref 70–99)

## 2011-02-21 LAB — CK TOTAL AND CKMB (NOT AT ARMC)
CK, MB: 2.9 ng/mL (ref 0.3–4.0)
Relative Index: 2.8 — ABNORMAL HIGH (ref 0.0–2.5)
Total CK: 104 U/L (ref 7–177)
Total CK: 99 U/L (ref 7–177)

## 2011-02-21 LAB — MRSA PCR SCREENING: MRSA by PCR: NEGATIVE

## 2011-02-21 LAB — GLUCOSE, CAPILLARY
Glucose-Capillary: 81 mg/dL (ref 70–99)
Glucose-Capillary: 70 mg/dL (ref 70–99)
Glucose-Capillary: 151 mg/dL — ABNORMAL HIGH (ref 70–99)

## 2011-02-21 LAB — TSH: TSH: 1.994 u[IU]/mL (ref 0.350–4.500)

## 2011-02-21 LAB — TROPONIN I: Troponin I: <0.3 ng/mL (ref <0.30)

## 2011-02-22 LAB — CBC
HCT: 29.7 % — ABNORMAL LOW (ref 36.0–46.0)
MCHC: 31.3 g/dL (ref 30.0–36.0)
Platelets: 271 10*3/uL (ref 150–400)
RDW: 17.3 % — ABNORMAL HIGH (ref 11.5–15.5)
WBC: 6.6 10*3/uL (ref 4.0–10.5)

## 2011-02-22 LAB — BASIC METABOLIC PANEL
BUN: 30 mg/dL — ABNORMAL HIGH (ref 6–23)
Creatinine, Ser: 1.21 mg/dL — ABNORMAL HIGH (ref 0.50–1.10)
GFR calc Af Amer: 54 mL/min — ABNORMAL LOW (ref >60)
GFR calc non Af Amer: 45 mL/min — ABNORMAL LOW (ref >60)
Potassium: 4.1 mEq/L (ref 3.5–5.1)

## 2011-02-22 LAB — GLUCOSE, CAPILLARY: Glucose-Capillary: 74 mg/dL (ref 70–99)

## 2011-03-30 ENCOUNTER — Telehealth: Payer: Self-pay | Admitting: Cardiology

## 2011-03-30 NOTE — Telephone Encounter (Signed)
Called wanting to know if we could see her sooner than 10/8. Dr. Karlton Lemon referred back because BP is not under control. Has been running 200/110 and sometimes higher. Is worried that she could have stroke.  Will work her in on 9/26. Will get records from Dr. Karlton Lemon

## 2011-03-30 NOTE — Telephone Encounter (Signed)
Pt wants to know could she be seen sooner than October 8 please call

## 2011-03-31 ENCOUNTER — Encounter: Payer: Self-pay | Admitting: Cardiology

## 2011-04-03 ENCOUNTER — Encounter: Payer: Self-pay | Admitting: Cardiology

## 2011-04-04 ENCOUNTER — Ambulatory Visit (INDEPENDENT_AMBULATORY_CARE_PROVIDER_SITE_OTHER): Payer: Medicare Other | Admitting: Cardiology

## 2011-04-04 ENCOUNTER — Encounter: Payer: Self-pay | Admitting: Cardiology

## 2011-04-04 DIAGNOSIS — E785 Hyperlipidemia, unspecified: Secondary | ICD-10-CM

## 2011-04-04 DIAGNOSIS — I2510 Atherosclerotic heart disease of native coronary artery without angina pectoris: Secondary | ICD-10-CM

## 2011-04-04 DIAGNOSIS — Z794 Long term (current) use of insulin: Secondary | ICD-10-CM

## 2011-04-04 DIAGNOSIS — E119 Type 2 diabetes mellitus without complications: Secondary | ICD-10-CM

## 2011-04-04 DIAGNOSIS — I10 Essential (primary) hypertension: Secondary | ICD-10-CM

## 2011-04-04 DIAGNOSIS — I739 Peripheral vascular disease, unspecified: Secondary | ICD-10-CM

## 2011-04-04 MED ORDER — HYDRALAZINE HCL 25 MG PO TABS: 50.0000 mg | ORAL_TABLET | Freq: Three times a day (TID) | ORAL | Status: DC

## 2011-04-04 NOTE — Patient Instructions (Signed)
Continue your current medications.  Restrict sodium in your diet.  Keep your appointment with Dr. Kellie Simmering.  I will tentatively see you again in 3 months.

## 2011-04-05 ENCOUNTER — Other Ambulatory Visit: Payer: Self-pay

## 2011-04-05 DIAGNOSIS — I739 Peripheral vascular disease, unspecified: Secondary | ICD-10-CM | POA: Insufficient documentation

## 2011-04-05 DIAGNOSIS — E785 Hyperlipidemia, unspecified: Secondary | ICD-10-CM | POA: Insufficient documentation

## 2011-04-05 DIAGNOSIS — Z794 Long term (current) use of insulin: Secondary | ICD-10-CM | POA: Insufficient documentation

## 2011-04-05 DIAGNOSIS — I10 Essential (primary) hypertension: Secondary | ICD-10-CM | POA: Insufficient documentation

## 2011-04-05 DIAGNOSIS — I70219 Atherosclerosis of native arteries of extremities with intermittent claudication, unspecified extremity: Secondary | ICD-10-CM

## 2011-04-05 DIAGNOSIS — I2510 Atherosclerotic heart disease of native coronary artery without angina pectoris: Secondary | ICD-10-CM | POA: Insufficient documentation

## 2011-04-05 NOTE — Assessment & Plan Note (Signed)
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 She is willing to consider revascularization therapy at this time.

## 2011-04-05 NOTE — Assessment & Plan Note (Signed)
She is doing very well from a cardiac standpoint and has made an excellent recovery from her bypass surgery. She remains asymptomatic. We'll continue with her current therapy. She is cleared for major vascular surgery.

## 2011-04-05 NOTE — Assessment & Plan Note (Signed)
She is on multiple high-dose antihypertensive therapy. 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 I think he'll be very difficult to control her blood pressure adequately until she has revascularization. She is scheduled to see Dr. Kellie Simmering in 2 weeks. She is going to require extensive aortic grafting with bifemoral bypass and I suspect some type of renal revascularization. I think there is little else that her current hypertensive therapy and I agree with her recent increase in hydralazine.

## 2011-04-05 NOTE — Progress Notes (Signed)
Norma [name] Date of Birth: 10-02-1945   History of Present Illness: Norma [name] is seen for followup today. She has a history of coronary disease and is status post CABG in June of 2011. This included an LIMA graft to the LAD and a saphenous vein graft to the obtuse marginal vessel. She has known severe peripheral vascular disease with extensive aortic obstruction and iliac disease. She also had moderate bilateral renal artery disease. She reports that she's had a difficult time controlling her blood pressure. She has had a number of changes in her medications by Dr. Karlton Lemon who has recently placed her on increasing doses of hydralazine. 3 weeks ago she was scheduled for colonoscopy but this had to be canceled because of markedly elevated blood pressure [id number] systolic. She notes that she has been under a lot of stress. Her daughter is scheduled to start dialysis. She does complain of claudication symptoms with walking. It is difficult for her to quantitate this. She is scheduled for followup with Dr. Kellie Simmering on October 7. She denies any chest pain, shortness of breath, or increase in edema.  Current Outpatient Prescriptions on File Prior to Visit  Medication Sig Dispense Refill  . aliskiren (TEKTURNA) 300 MG tablet Take 300 mg by mouth daily.        Marland Kitchen amLODipine (NORVASC) 10 MG tablet Take 10 mg by mouth daily.        Marland Kitchen aspirin 325 MG tablet Take 325 mg by mouth daily.        Marland Kitchen atorvastatin (LIPITOR) 20 MG tablet Take 20 mg by mouth daily.        . Calcium Carbonate-Vitamin D (CALCIUM + D PO) Take by mouth daily.        . carvedilol (COREG) 12.5 MG tablet Take 12.5 mg by mouth 2 (two) times daily with a meal.        . fish oil-omega-3 fatty acids 1000 MG capsule Take 1 g by mouth 2 (two) times daily.        Marland Kitchen glimepiride (AMARYL) 4 MG tablet Take 4 mg by mouth daily before breakfast.        . hydrALAZINE (APRESOLINE) 25 MG tablet Take 2 tablets (50 mg total) by mouth 3 (three) times daily.      .  insulin aspart (NOVOLOG) 100 UNIT/ML injection Inject 4 Units into the skin 3 (three) times daily before meals.        . insulin glargine (LANTUS) 100 UNIT/ML injection Inject 36 Units into the skin 2 (two) times daily.        . metFORMIN (GLUCOPHAGE) 1000 MG tablet Take 1,000 mg by mouth 2 (two) times daily with a meal.        . Multiple Vitamin (MULTIVITAMIN) tablet Take 1 tablet by mouth daily.        . valsartan-hydrochlorothiazide (DIOVAN-HCT) 320-25 MG per tablet Take 1 tablet by mouth daily.        Marland Kitchen VITAMIN E PO Take by mouth.          Allergies  Allergen Reactions  . Iohexol      Code: RASH, Desc: pt called 1 day post scanning stating that skin was red and "itching all over" some what better but still had symptom.. instructed pt to take benadryl to relieve symptoms,per dr Alvester Chou.if any problems call back/mms, Onset Date: [id number]   . Sulfa Antibiotics     Past Medical History  Diagnosis Date  . Claudication   . Diabetes mellitus   .  Hypertension   . Hyperlipidemia   . DJD (degenerative joint disease)   . PAD (peripheral artery disease)   . Coronary artery disease     Past Surgical History  Procedure Date  . Cardiac catheterization 12/01/2009    EF 65%  . Removal of fibrous cyst from right breast   . Retinal detachment surgery   . Coronary artery bypass graft 12/08/2009    LIMA GRAFT TO THE DISTAL LAD AND SAPHENOUS VEIN GRAFT TO THE OBTUSE MARGINAL VESSEL  . Transthoracic echocardiogram 12/01/2009    EF 60-65%  . Cardiovascular stress test 11/28/2009    EF 75%    History  Smoking status  . Former Smoker  . Quit date: 07/10/1991  Smokeless tobacco  . Not on file    History  Alcohol Use No    Family History  Problem Relation Age of Onset  . Hypertension Mother   . Coronary artery disease Father   . Hypertension Sister   . Cirrhosis Brother     Review of Systems: As noted in history of present illness.  All other systems were reviewed and are  negative.  Physical Exam: BP 204/58  Pulse 60  Ht 5\' 2"  (1.575 m)  Wt 243 lb (110.224 kg)  BMI 44.45 kg/m2 She is a morbidly obese black female in no acute distress. She is normocephalic, atraumatic. Pupils are equal round and reactive. Sclera clear. Oropharynx is clear. Neck is supple no JVD, adenopathy, thyromegaly, or bruits. There are no subclavian bruits. Lungs are clear. Cardiac exam reveals a regular rate and rhythm without gallop, murmur, or click. Abdomen is obese, soft, nontender. She has a large pannus. There are no masses. Feet are warm and dry. She has poor pedal pulses. There is no significant edema. Skin is warm and dry. Neurologic exam she is alert and oriented x3. Cranial nerves II through XII are intact. She has no focal findings. LABORATORY DATA: ECG today demonstrates normal sinus rhythm with nonspecific T-wave abnormality. Blood work dated 01/23/2011 shows a BUN of 30 with a creatinine of 1.01. Electrolytes and liver function studies were normal. Total cholesterol 178, triglycerides 120, HDL 46, and LDL 108. A1c was 6.6%.  Assessment / Plan:

## 2011-04-11 ENCOUNTER — Encounter: Payer: Self-pay | Admitting: Vascular Surgery

## 2011-04-16 ENCOUNTER — Encounter: Payer: Self-pay | Admitting: Vascular Surgery

## 2011-04-16 ENCOUNTER — Institutional Professional Consult (permissible substitution): Payer: Medicare Other | Admitting: Cardiology

## 2011-04-17 ENCOUNTER — Encounter: Payer: Self-pay | Admitting: Vascular Surgery

## 2011-04-17 ENCOUNTER — Ambulatory Visit (INDEPENDENT_AMBULATORY_CARE_PROVIDER_SITE_OTHER): Payer: Medicare Other | Admitting: Vascular Surgery

## 2011-04-17 ENCOUNTER — Other Ambulatory Visit (INDEPENDENT_AMBULATORY_CARE_PROVIDER_SITE_OTHER): Payer: Medicare Other | Admitting: *Deleted

## 2011-04-17 VITALS — BP 208/67 | HR 62 | Resp 24 | Ht 61.5 in | Wt 242.2 lb

## 2011-04-17 DIAGNOSIS — I70219 Atherosclerosis of native arteries of extremities with intermittent claudication, unspecified extremity: Secondary | ICD-10-CM

## 2011-04-17 DIAGNOSIS — R0989 Other specified symptoms and signs involving the circulatory and respiratory systems: Secondary | ICD-10-CM

## 2011-04-17 NOTE — Progress Notes (Signed)
Subjective:     Patient ID: Norma [name], female   DOB: [birth year], 65 y.o.   MRN: [id number]  HPI this 65 year old female patient returns for further discussion of her lower extremity occlusive disease. She was originally evaluated by me in May of 2011. She was found to have severe occlusive disease in her suprarenal aorta from the celiac axis down to below the renal arteries she was also found to have severe coronary artery disease. He underwent coronary artery bypass grafting by Dr.Owen . She returns today for further evaluation. She has stable claudication symptoms after walking a short distance-100 feet. She denies rest pain or history of infection non-healing ulcers or gangrene he also denies neurologic symptoms such as lateralizing weakness aphasia or amaurosis fugax diplopia blurred vision or syncope.  Past Medical History  Diagnosis Date  . Claudication   . Diabetes mellitus   . Hypertension   . Hyperlipidemia   . DJD (degenerative joint disease)   . PAD (peripheral artery disease)   . Coronary artery disease   . Leg pain   . Carotid artery occlusion   . Obesity   . DDD (degenerative disc disease)   . DJD (degenerative joint disease)     History  Substance Use Topics  . Smoking status: Former Smoker    Quit date: 07/10/1991  . Smokeless tobacco: Not on file  . Alcohol Use: No    Family History  Problem Relation Age of Onset  . Hypertension Mother   . Coronary artery disease Father   . Hypertension Sister   . Cirrhosis Brother   . Kidney disease Daughter     Allergies  Allergen Reactions  . Iohexol      Code: RASH, Desc: pt called 1 day post scanning stating that skin was red and "itching all over" some what better but still had symptom.. instructed pt to take benadryl to relieve symptoms,per dr Alvester Chou.if any problems call back/mms, Onset Date: [id number]   . Sulfa Antibiotics     Current outpatient prescriptions:aliskiren (TEKTURNA) 300 MG tablet, Take 300 mg by  mouth daily.  , Disp: , Rfl: ;  amLODipine (NORVASC) 10 MG tablet, Take 10 mg by mouth daily.  , Disp: , Rfl: ;  aspirin 325 MG tablet, Take 325 mg by mouth daily.  , Disp: , Rfl: ;  atorvastatin (LIPITOR) 20 MG tablet, Take 20 mg by mouth daily.  , Disp: , Rfl: ;  Calcium Carbonate-Vitamin D (CALCIUM + D PO), Take by mouth daily.  , Disp: , Rfl:  carvedilol (COREG) 12.5 MG tablet, Take 12.5 mg by mouth 2 (two) times daily with a meal.  , Disp: , Rfl: ;  fish oil-omega-3 fatty acids 1000 MG capsule, Take 1 g by mouth 2 (two) times daily.  , Disp: , Rfl: ;  glimepiride (AMARYL) 4 MG tablet, Take 4 mg by mouth daily before breakfast.  , Disp: , Rfl: ;  hydrALAZINE (APRESOLINE) 25 MG tablet, Take 2 tablets (50 mg total) by mouth 3 (three) times daily., Disp: , Rfl:  insulin aspart (NOVOLOG) 100 UNIT/ML injection, Inject 4 Units into the skin 3 (three) times daily before meals.  , Disp: , Rfl: ;  insulin glargine (LANTUS) 100 UNIT/ML injection, Inject 36 Units into the skin 2 (two) times daily.  , Disp: , Rfl: ;  metFORMIN (GLUCOPHAGE) 1000 MG tablet, Take 1,000 mg by mouth 2 (two) times daily with a meal.  , Disp: , Rfl: ;  Multiple Vitamin (MULTIVITAMIN) tablet,  Take 1 tablet by mouth daily.  , Disp: , Rfl:  valsartan-hydrochlorothiazide (DIOVAN-HCT) 320-25 MG per tablet, Take 1 tablet by mouth daily.  , Disp: , Rfl:   BP 208/67  Pulse 62  Resp 24  Ht 5' 1.5" (1.562 m)  Wt 242 lb 3.2 oz (109.861 kg)  BMI 45.02 kg/m2  Body mass index is 45.02 kg/(m^2).       Review of Systems denies chest pain but does admit to dyspnea on exertion other than present illness all other systems are negative and a complete review of systems    Objective:   Physical Exam blood pressure 208/67 heart rate 62 respirations 24 Gen. she is an obese female is in no apparent distress alert and oriented x3 HEENT exam normal for age Chest no rhonchi or wheezing Cardiovascular regular no murmurs carotid pulses 3+ with harsh  bruits bilaterally Abdomen obese no palpable masses Absent femoral pulses bilaterally. No distal pulses palpable.  Today I ordered a carotid duplex exam which I reviewed and interpreted. She has mild to moderate occlusive disease in her internal carotids left slightly worse than right but both in the 40-50% also ordered ABIs. She has 0.24 on the left and 0.58 on the right.    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 Poor candidate for operative procedure because of her obesity and the extensive nature of surgery needed . Carotid disease is not severe we'll not need to be addressed. Plan to see her back in 6 months with ABIs unless her symptoms worsen in the interim

## 2011-04-19 NOTE — Discharge Summary (Signed)
Norma [name], Norma [name]                ACCOUNT NO.:  [id number]  MEDICAL RECORD NO.:  [id number]  LOCATION:  2508                         FACILITY:  [facility]  PHYSICIAN:  Domenic Polite, MD     DATE OF BIRTH:  [birth year]  DATE OF ADMISSION:  02/20/2011 DATE OF DISCHARGE:                              DISCHARGE SUMMARY   PRIMARY CARE PHYSICIAN:  [name], MD  CARDIOLOGIST:  Peter M. Martinique, MD  GASTROENTEROLOGIST:  Tory Emerald. Benson Norway, MD  VASCULAR SURGEON:  Nelda Severe. Kellie Simmering, MD  DISCHARGE DIAGNOSES: 1. Hypertensive urgency, improved. 2. History of coronary artery disease status post single vessel     coronary artery bypass grafting in June 2011. 3. History of peripheral vascular disease followed by Dr. Kellie Simmering. 4. Dyslipidemia. 5. Obesity. 6. Microcytic anemia. 7. Hypertension. 8. Type 2 diabetes.  DISCHARGE MEDICATIONS: 1. Hydralazine 50 mg p.o. t.i.d. 2. Amlodipine 10 mg daily. 3. Lantus 28 units subcu t.i.d. 4. Aliskiren  300 mg p.o. daily. 5. Atorvastatin 20 mg daily. 6. Aspirin 325 mg daily. 7. Calcium carbonate over-the-counter 1-2 tablets daily. 8. Carvedilol 12.5 mg p.o. b.i.d. 9. Diovan HCT 320/25 one tablet daily. 10.Fish oil 1000 mg over-the-counter 1 capsule b.i.d. 11.Glimepiride 4 mg daily. 12.Hydrocodone/APAP 5/500 one tablet q. 6 h. p.r.n. 13.Metformin 1000 mg p.o. b.i.d. 14.NovoLog 4 units subcu t.i.d. with meals. 15.Vitamin E oral 800 international units 1 tablet daily.  DIAGNOSTIC INVESTIGATIONS:  None.  HOSPITAL COURSE:  Norma [name] is a very pleasant 65 year old African American female with history of longstanding hypertension and CAD who was supposed to have an outpatient colonoscopy by Dr. Benson Norway on February 20, 2011, and was evaluated in the endoscopy suite found to have blood pressure in the 240s, subsequently sent to the emergency room for further evaluation and management. 1. Hypertensive emergency.  She did not have any evidence of  end-organ     failure, i.e. suggestive of hypertensive emergency effect.  She was     treated with IV nitrite and hydralazine, subsequently transitioned     to her p.o. agents.  Her blood pressure has been subsequently     reasonably controlled.  We increased her hydralazine to 50 mg three     times a day and added amlodipine back into her home regimen.  Rest     of her medical problems remained stable.  She will follow up with     Dr. Karlton Lemon in a week and continue on her prior home     antihypertensives. 2. History of CAD status post single vessel CABG from left main     disease back in June 2011.  She has been stable from this     standpoint.  Will follow up with Dr. Martinique. 3. Iron-deficiency anemia.  Supposed to have a screening colonoscopy.     This will be rescheduled through Dr. Ulyses Amor office within the next     2-4 weeks' time and the patient will be called for the one     scheduled accordingly.     Domenic Polite, MD     PJ/MEDQ  D:  02/22/2011  T:  02/22/2011  Job:  [id number]  cc:  [name], M.D. Tory Emerald Benson Norway, MD  Electronically Signed by Domenic Polite  on 04/19/2011 02:02:45 PM

## 2011-04-20 LAB — URINALYSIS, ROUTINE W REFLEX MICROSCOPIC
Leukocytes, UA: NEGATIVE
Nitrite: NEGATIVE
Protein, ur: 30 — AB
Urobilinogen, UA: 1

## 2011-04-20 LAB — BASIC METABOLIC PANEL
BUN: 33 — ABNORMAL HIGH
BUN: 44 — ABNORMAL HIGH
BUN: 59 — ABNORMAL HIGH
CO2: 21
CO2: 21
CO2: 23
Calcium: 7.9 — ABNORMAL LOW
Calcium: 8.9
Calcium: 9.1
Chloride: 104
Chloride: 98
Creatinine, Ser: 0.91
Creatinine, Ser: 1.27 — ABNORMAL HIGH
Creatinine, Ser: 2.64 — ABNORMAL HIGH
GFR calc Af Amer: 22 — ABNORMAL LOW
GFR calc Af Amer: 52 — ABNORMAL LOW
GFR calc Af Amer: >60
GFR calc non Af Amer: 16 — ABNORMAL LOW
GFR calc non Af Amer: 20 — ABNORMAL LOW
Glucose, Bld: 193 — ABNORMAL HIGH
Glucose, Bld: 213 — ABNORMAL HIGH
Glucose, Bld: 293 — ABNORMAL HIGH
Potassium: 2.7 — CL
Potassium: 3.7
Potassium: 3.8
Sodium: 142
Sodium: 147 — ABNORMAL HIGH

## 2011-04-20 LAB — CBC
HCT: 32.5 — ABNORMAL LOW
HCT: 47.6 — ABNORMAL HIGH
Hemoglobin: 14.2
MCHC: 33.8
MCV: 88.3
MCV: 92.2
Platelets: 276
Platelets: 292
RBC: 3.68 — ABNORMAL LOW
RDW: 13.7
RDW: 14.7 — ABNORMAL HIGH

## 2011-04-20 LAB — COMPREHENSIVE METABOLIC PANEL
Albumin: 4
BUN: 77 — ABNORMAL HIGH
Creatinine, Ser: 2.49 — ABNORMAL HIGH
GFR calc Af Amer: 24 — ABNORMAL LOW
Potassium: 5.2 — ABNORMAL HIGH
Total Protein: 7.9

## 2011-04-20 LAB — URINE MICROSCOPIC-ADD ON

## 2011-04-20 LAB — DIFFERENTIAL
Lymphocytes Relative: 8 — ABNORMAL LOW
Monocytes Absolute: 0.3
Monocytes Relative: 3
Neutro Abs: 8.7 — ABNORMAL HIGH

## 2011-04-20 LAB — CULTURE, BLOOD (ROUTINE X 2): Culture: NO GROWTH

## 2011-04-20 LAB — PHOSPHORUS: Phosphorus: 8.1 — ABNORMAL HIGH

## 2011-04-20 LAB — SODIUM, URINE, RANDOM: Sodium, Ur: 21

## 2011-04-20 LAB — LIPID PANEL
Cholesterol: 265 — ABNORMAL HIGH
HDL: 48
LDL Cholesterol: 156 — ABNORMAL HIGH
Triglycerides: 304 — ABNORMAL HIGH

## 2011-04-20 LAB — CK TOTAL AND CKMB (NOT AT ARMC): CK, MB: 4.3 — ABNORMAL HIGH

## 2011-04-20 LAB — CREATININE, URINE, RANDOM: Creatinine, Urine: 217.6

## 2011-04-21 NOTE — H&P (Signed)
[name], [name]                ACCOUNT NO.:  [id number]  MEDICAL RECORD NO.:  [id number]  LOCATION:  2920                         FACILITY:  [facility]  PHYSICIAN:  Barbette Merino, M.D.      DATE OF BIRTH:  Apr 17, 1946  DATE OF ADMISSION:  02/20/2011 DATE OF DISCHARGE:                             HISTORY & PHYSICAL   PRIMARY CARE PHYSICIAN:  Dr. Willey Blade.  PRESENTING COMPLAINT:  High blood pressure.  HISTORY OF PRESENT ILLNESS:  The patient is a 65 year old female with history of hypertension and coronary artery disease who was apparently being prepped for colonoscopy rather went to have colonoscopy done as an outpatient.  While in the [facility] by Dr. Almyra Free she was noted to have elevated blood pressure, and the procedure was aborted.  The patient was sent to the emergency room for further management.  She denied any dizziness.  She denies any nausea, vomiting, or diarrhea. She did try to control her blood pressure in the emergency room without much success.  Her systolic was found to be A999333 arrival.  She already received a dose of hydralazine prior to coming in and here she has had multiple IV medications including Lopressor, etc., but her systolic has remained in the 240s.  Hence she is being admitted for further management.  Her past medical history is significant for coronary artery disease, diabetes, hypertension, chronic hyperlipidemia, morbid obesity, and microcytic anemia.  ALLERGIES:  To IVP DYE and SULFA.  MEDICATIONS:  Aspirin, Coreg, Diovan, hydralazine, Lantus, Lasix, metformin, naproxen, sodium, NovoLog Tekturna, Vicodin, and Atorvastatin.  SOCIAL HISTORY:  The patient lives in [place] with her family. Denied tobacco, alcohol, or IV drug use.  FAMILY HISTORY:  Mainly hypertension.  REVIEW OF SYSTEMS:  All system reviewed are negative currently except per HPI.  PHYSICAL EXAMINATION:  VITAL SIGNS:  Temperature is 98.1, blood pressure 230/62  with pulse 57, respiratory rate 18, sats 95% on room air. GENERAL:  She is awake, alert, oriented very pleasant woman who is in no acute distress.  HEENT:  PERRL.  EOMI.  No pallor, no jaundice.  No rhinorrhea.  Neck is supple.  No JVD, no lymphadenopathy. CHEST:  Respiratory, she has good air entry bilaterally.  No wheezes, no rales, no crackles.  CARDIOVASCULAR:  She has S1-S2.  No audible murmur. Abdomen: Soft full, nontender with positive bowel sounds. EXTREMITIES:  No edema, cyanosis, or clubbing. SKIN:  No rashes.  No ulcers. MUSCULOSKELETAL:  No joint swelling or tenderness.  LABORATORY DATA:  Her white count is 5.05, hemoglobin 10.7 with an MCV of 86 with platelet 295 normal differential.  Sodium is 142, potassium 4.0, chloride 107, CO2 of 25, glucose 121, BUN 14, creatinine 0.75, albumin is 3.4, total protein 7.1 and calcium 9.9.  The rest of the LFTs within normal.  Troponin is negative.  Pro-BNP 875, CK 103, MB 2.9.  EKG shows sinus bradycardia with no significant ST changes.  ASSESSMENT:  This is a 65 year old female with known history of hypertension who is having hypertensive urgency.  The patient's colonoscopy was aborted.  Per patient, she took her antihypertensives today, however they are not being controlled.  This could be  partly secondary hypertension, although she has longstanding hypertension.  PLAN: 1. Hypertensive urgency admit the patient to step-down.  I will start     her on IV Nipride with intermittent hydralazine IV as needed to.     We cannot give any beta blockers or calcium channel blockers due to     her low heart rate.  We will also cannot use Lopressor in this case     IV IV.  We will try using oral medications in addition to the IV     medicine to control her blood pressure.  If this persists, we may     have to test her for renin angiotensin system and other causes of     secondary hypertension. 2. Hyperlipidemia.  Continue with statin and check  fasting lipids for     diabetes.  We will put her on sliding scale insulin and continue     with Lantus from home dose. 3. Coronary artery disease.  She was status post coronary artery     bypass graft in 2011, but she has no chest pain now.  We will     monitor her closely and cycle her enzymes.  Obesity seems stable at     this point.  Normocytic anemia.  The patient was undergoing a     colonoscopy which will be resumed at a later date.     Barbette Merino, M.D.     [place]  D:  02/21/2011  T:  02/21/2011  Job:  [id number]  Electronically Signed by Barbette Merino M.D. on 04/21/2011 02:53:23 PM

## 2011-04-23 NOTE — Procedures (Unsigned)
CAROTID DUPLEX EXAM  INDICATION:  Bruit.  HISTORY: Diabetes:  Yes. Cardiac:  Yes. Hypertension:  Yes. Smoking:  Previous. Previous Surgery:  No. CV History:  Asymptomatic. Amaurosis Fugax No, Paresthesias No, Hemiparesis No.                                      RIGHT             LEFT Brachial systolic pressure:         250               243 Brachial Doppler waveforms:         Normal            Normal Vertebral direction of flow:        Antegrade         Antegrade DUPLEX VELOCITIES (cm/sec) CCA peak systolic                   121               [id number] ECA peak systolic                   178               0000000 ICA peak systolic                   104               [id number] ICA end diastolic                   22                36 PLAQUE MORPHOLOGY:                  Mixed             Mixed PLAQUE AMOUNT:                      Minimal           Minimal/moderate PLAQUE LOCATION:                    Bifurcation       CCA, ICA, bifurcation  IMPRESSION: 1. Bilateral internal carotid artery velocities suggest a 1% to 39%     stenosis. 2. Velocities may be under-estimated due to calcific plaque with     acoustic shadowing. 3. Technically difficult study due to patient respirations and vessel     depth.  ___________________________________________ Nelda Severe. Kellie Simmering, M.D.  EM/MEDQ  D:  04/17/2011  T:  04/17/2011  Job:  [id number]

## 2011-05-01 ENCOUNTER — Ambulatory Visit (HOSPITAL_COMMUNITY): Payer: Medicare Other

## 2011-05-01 ENCOUNTER — Ambulatory Visit (HOSPITAL_COMMUNITY)
Admission: RE | Admit: 2011-05-01 | Discharge: 2011-05-01 | Disposition: A | Discharge status: Home / Self Care | Payer: Medicare Other | Source: Ambulatory Visit | Attending: Surgery | Admitting: Surgery

## 2011-05-01 DIAGNOSIS — I2510 Atherosclerotic heart disease of native coronary artery without angina pectoris: Secondary | ICD-10-CM | POA: Insufficient documentation

## 2011-05-01 DIAGNOSIS — I70219 Atherosclerosis of native arteries of extremities with intermittent claudication, unspecified extremity: Secondary | ICD-10-CM

## 2011-05-01 LAB — POCT I-STAT, CHEM 8
BUN: 27 mg/dL — ABNORMAL HIGH (ref 6–23)
Calcium, Ion: 1.2 mmol/L (ref 1.12–1.32)
Chloride: 113 mEq/L — ABNORMAL HIGH (ref 96–112)
Creatinine, Ser: 1 mg/dL (ref 0.50–1.10)
Sodium: 144 mEq/L (ref 135–145)
TCO2: 18 mmol/L (ref 0–100)

## 2011-05-01 LAB — GLUCOSE, CAPILLARY: Glucose-Capillary: 205 mg/dL — ABNORMAL HIGH (ref 70–99)

## 2011-05-01 MED ORDER — IOHEXOL 350 MG/ML SOLN
150.0000 mL | Freq: Once | INTRAVENOUS | Status: AC | PRN
  Administered 2011-05-01: 150 mL via INTRAVENOUS

## 2011-05-02 ENCOUNTER — Encounter: Payer: Self-pay | Admitting: Surgery

## 2011-05-07 ENCOUNTER — Encounter: Payer: Self-pay | Admitting: Vascular Surgery

## 2011-05-08 ENCOUNTER — Encounter: Payer: Self-pay | Admitting: Vascular Surgery

## 2011-05-08 ENCOUNTER — Telehealth: Payer: Self-pay | Admitting: Cardiology

## 2011-05-08 ENCOUNTER — Ambulatory Visit (INDEPENDENT_AMBULATORY_CARE_PROVIDER_SITE_OTHER): Payer: Medicare Other | Admitting: Vascular Surgery

## 2011-05-08 VITALS — BP 210/35 | HR 82 | Resp 20 | Ht 62.0 in | Wt 242.0 lb

## 2011-05-08 DIAGNOSIS — I70219 Atherosclerosis of native arteries of extremities with intermittent claudication, unspecified extremity: Secondary | ICD-10-CM

## 2011-05-08 NOTE — Telephone Encounter (Signed)
New message:  Pt to have surgery in Nov and Dr. Kellie Simmering wants a surgical clearance before then.  Please check his schedule to see if she can be.  Offered PA but did not want to see.

## 2011-05-08 NOTE — Telephone Encounter (Signed)
Pt was given clearance for vascular surgery at her 9/12 appt with Dr Martinique.  Please call pt and let her know clearance has been send to Dr Kellie Simmering already.

## 2011-05-08 NOTE — Progress Notes (Signed)
Subjective:     Patient ID: Norma [name], female   DOB: Aug 23, 1945, 65 y.o.   MRN: [id number]  HPI this 65 year old female returns today to discuss revascularization of her lower extremities. 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 This does not involve flow to the celiac axis or superior mesenteric artery but is infringing on the flow to both renal arteries. She had a CT angiogram performed last week which I have reviewed today. It appears that the plaque in the supra-renal aorta has progressed since May of 2011 when she had her angiogram performed. Claudication symptoms are quite severe limiting her walking one quarter of a block. She tolerated her cardiac surgery well last year and has been followed by Dr. Peter Martinique.  Past Medical History  Diagnosis Date  . Claudication   . Diabetes mellitus   . Hypertension   . Hyperlipidemia   . DJD (degenerative joint disease)   . PAD (peripheral artery disease)   . Coronary artery disease   . Leg pain   . Carotid artery occlusion   . Obesity   . DDD (degenerative disc disease)   . DJD (degenerative joint disease)     History  Substance Use Topics  . Smoking status: Former Smoker -- 20 years    Types: Cigarettes    Quit date: 07/10/1991  . Smokeless tobacco: Never Used  . Alcohol Use: No    Family History  Problem Relation Age of Onset  . Hypertension Mother   . Coronary artery disease Father   . Hypertension Sister   . Cirrhosis Brother   . Kidney disease Daughter     Allergies  Allergen Reactions  . Iohexol      Code: RASH, Desc: pt called 1 day post scanning stating that skin was red and "itching all over" some what better but still had symptom.. instructed pt to take  benadryl to relieve symptoms,per dr Alvester Chou.if any problems call back/mms, Onset Date: [id number]   . Sulfa Antibiotics     Current outpatient prescriptions:aliskiren (TEKTURNA) 300 MG tablet, Take 300 mg by mouth daily.  , Disp: , Rfl: ;  amLODipine (NORVASC) 10 MG tablet, Take 10 mg by mouth daily.  , Disp: , Rfl: ;  aspirin 325 MG tablet, Take 325 mg by mouth daily.  , Disp: , Rfl: ;  atorvastatin (LIPITOR) 20 MG tablet, Take 20 mg by mouth daily.  , Disp: , Rfl: ;  Calcium Carbonate-Vitamin D (CALCIUM + D PO), Take by mouth daily.  , Disp: , Rfl:  carvedilol (COREG) 12.5 MG tablet, Take 12.5 mg by mouth 2 (two) times daily with a meal.  , Disp: , Rfl: ;  Coenzyme Q10 200 MG capsule, Take 200 mg by mouth daily.  , Disp: , Rfl: ;  fish oil-omega-3 fatty acids 1000 MG capsule, Take 1 g by mouth 2 (two) times daily.  , Disp: , Rfl: ;  glimepiride (AMARYL) 4 MG tablet, Take 4 mg by mouth daily before breakfast.  , Disp: , Rfl:  hydrALAZINE (APRESOLINE) 25 MG tablet, Take 2 tablets (50 mg total) by mouth 3 (three) times daily., Disp: , Rfl: ;  insulin aspart (NOVOLOG) 100 UNIT/ML injection, Inject 4 Units into the skin 3 (three)  times daily before meals.  , Disp: , Rfl: ;  insulin glargine (LANTUS) 100 UNIT/ML injection, Inject 36 Units into the skin 2 (two) times daily.  , Disp: , Rfl:  metFORMIN (GLUCOPHAGE) 1000 MG tablet, Take 1,000 mg by mouth 2 (two) times daily with a meal.  , Disp: , Rfl: ;  Multiple Vitamin (MULTIVITAMIN) tablet, Take 1 tablet by mouth daily.  , Disp: , Rfl: ;  valsartan-hydrochlorothiazide (DIOVAN-HCT) 320-25 MG per tablet, Take 1 tablet by mouth daily.  , Disp: , Rfl:   BP 210/35  Pulse 82  Resp 20  Ht 5\' 2"  (1.575 m)  Wt 242 lb (109.77 kg)  BMI 44.26 kg/m2  Body mass index is 44.26 kg/(m^2).         Review of Systems she denies any chest pain but does have dyspnea on exertion. Implants of arthritis and joint pain. All other systems are negative the complete review of  systems     Objective:   Physical Exam blood pressure 210/35 heart rate 82 respirations 20 General she is an obese female is in no apparent distress alert and oriented x3 HEENT normal for age Lungs no rhonchi or wheezing Cardiovascular regular of the middle murmurs Abdomen obese no palpable masses. No evidence of previous surgery Lower extremity exam reveals absent femoral and distal pulses. 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 We'll contact Dr. Peter Martinique to see if further cardiac evaluation is needed prior to the surgery which is tentatively scheduled for November 16. Patient saw Dr. Martinique in late September of this year. Risks and benefits of this procedure were thoroughly discussed with the patient today including renal insufficiency, infection, bowel infarction, and death  she realizes that this is a very complex procedure but necessary because of her serious problem

## 2011-05-08 NOTE — Op Note (Signed)
  Norma [name], Norma [name]                ACCOUNT NO.:  [id number]  MEDICAL RECORD NO.:  [id number]  LOCATION:  [facility]                         FACILITY:  [facility]  PHYSICIAN:  Theotis Burrow IV, MDDATE OF BIRTH:  03/11/1946  DATE OF PROCEDURE:  05/01/2011 DATE OF DISCHARGE:  05/01/2011                              OPERATIVE REPORT   PREOPERATIVE DIAGNOSIS:  Bilateral claudication.  POSTOPERATIVE DIAGNOSIS:  Bilateral claudication.  PROCEDURE PERFORMED: 1. Ultrasound access, right femoral artery. 2. Catheter in right common iliac artery. 3. Right iliofemoral angiogram.  SURGEON: 1. Annamarie Major IV, MD  INDICATION:  This is a 66 year old female with known peripheral vascular disease.  She was previously studied 1 year ago, found to have a posterior plaque in her visceral section as well as an occluded left iliac artery and stenosis within her right iliac vessel.  She comes in today having undergone coronary artery bypass grafting.  She has not had progressive symptoms.  She needs further diagnostic imaging.  DESCRIPTION OF PROCEDURE:  The patient was identified in the holding area, taken to room 8, and placed supine on the table.  The right groin was prepped and draped in the usual fashion.  A time-out was called. The right femoral artery was evaluated with ultrasound.  It was found to be very small in caliber with mild anterior calcification.  A digital ultrasound image was acquired.  The right femoral artery was then accessed under ultrasound guidance with a micropuncture needle.  An 0.018 wire was then advanced into the iliac vessel under fluoroscopic visualization and a micropuncture sheath was placed.  A Bentson wire was then inserted.  This met resistance in the iliac vessel, however, enough wire was placed so that I could place a 5-French sheath.  I then used a KMP catheter to try to navigate the wire into the aorta, however, I could not get it to pass.  I therefore shot an  injection through the KMP catheter which was located in the common iliac artery.  This revealed a occluded proximal right common iliac artery.  The external and internal iliac vessels were patent.  At this point in time, I elected to terminate the procedure.  The patient will be sent over for a CT scan to better evaluate her aorta.  IMPRESSION: 1. Occluded right iliac artery. 2. CT angiogram will be performed to better evaluate her aortoiliac     system.     Eldridge Abrahams, MD     VWB/MEDQ  D:  05/01/2011  T:  05/01/2011  Job:  [id number]  Electronically Signed by Orvan Falconer IV MD on Jun 14, 202012 09:47:40 PM

## 2011-05-15 ENCOUNTER — Other Ambulatory Visit: Payer: Self-pay | Admitting: *Deleted

## 2011-05-21 ENCOUNTER — Encounter (HOSPITAL_COMMUNITY): Payer: Self-pay | Admitting: Pharmacy Technician

## 2011-05-23 ENCOUNTER — Encounter (HOSPITAL_COMMUNITY)
Admission: RE | Admit: 2011-05-23 | Discharge: 2011-05-23 | Disposition: A | Discharge status: Home / Self Care | Payer: Medicare Other | Source: Ambulatory Visit | Attending: Vascular Surgery | Admitting: Vascular Surgery

## 2011-05-23 ENCOUNTER — Encounter (HOSPITAL_COMMUNITY): Payer: Self-pay

## 2011-05-23 ENCOUNTER — Other Ambulatory Visit: Payer: Self-pay | Admitting: *Deleted

## 2011-05-23 ENCOUNTER — Ambulatory Visit (HOSPITAL_COMMUNITY)
Admission: RE | Admit: 2011-05-23 | Discharge: 2011-05-23 | Disposition: A | Discharge status: Home / Self Care | Payer: Medicare Other | Source: Ambulatory Visit | Attending: Anesthesiology | Admitting: Anesthesiology

## 2011-05-23 DIAGNOSIS — I2510 Atherosclerotic heart disease of native coronary artery without angina pectoris: Secondary | ICD-10-CM | POA: Insufficient documentation

## 2011-05-23 DIAGNOSIS — I10 Essential (primary) hypertension: Secondary | ICD-10-CM | POA: Insufficient documentation

## 2011-05-23 DIAGNOSIS — Z01818 Encounter for other preprocedural examination: Secondary | ICD-10-CM | POA: Insufficient documentation

## 2011-05-23 DIAGNOSIS — Z01812 Encounter for preprocedural laboratory examination: Secondary | ICD-10-CM | POA: Insufficient documentation

## 2011-05-23 HISTORY — DX: Shortness of breath: R06.02

## 2011-05-23 HISTORY — DX: Anxiety disorder, unspecified: F41.9

## 2011-05-23 LAB — DIFFERENTIAL
Basophils Absolute: 0 10*3/uL (ref 0.0–0.1)
Basophils Relative: 0 % (ref 0–1)
Eosinophils Absolute: 0.2 10*3/uL (ref 0.0–0.7)
Monocytes Relative: 6 % (ref 3–12)
Neutrophils Relative %: 64 % (ref 43–77)

## 2011-05-23 LAB — BASIC METABOLIC PANEL
BUN: 21 mg/dL (ref 6–23)
Creatinine, Ser: 0.98 mg/dL (ref 0.50–1.10)
GFR calc Af Amer: 69 mL/min — ABNORMAL LOW (ref >90)
GFR calc non Af Amer: 59 mL/min — ABNORMAL LOW (ref >90)
Potassium: 4.6 mEq/L (ref 3.5–5.1)

## 2011-05-23 LAB — URINALYSIS, MICROSCOPIC ONLY
Bilirubin Urine: NEGATIVE
Ketones, ur: NEGATIVE mg/dL
Nitrite: NEGATIVE
Specific Gravity, Urine: 1.019 (ref 1.005–1.030)
Urobilinogen, UA: 1 mg/dL (ref 0.0–1.0)

## 2011-05-23 LAB — CBC
Hemoglobin: 8.8 g/dL — ABNORMAL LOW (ref 12.0–15.0)
MCH: 25.1 pg — ABNORMAL LOW (ref 26.0–34.0)
MCHC: 29.6 g/dL — ABNORMAL LOW (ref 30.0–36.0)
RDW: 18.8 % — ABNORMAL HIGH (ref 11.5–15.5)

## 2011-05-23 LAB — SURGICAL PCR SCREEN: Staphylococcus aureus: NEGATIVE

## 2011-05-23 NOTE — Pre-Procedure Instructions (Addendum)
[place]  05/23/2011   Your procedure is scheduled on:  05/25/11 Friday  Report to [place] at 5:30 AM.  Call this number if you have problems the morning of surgery: [phone]   Remember:   Do not eat food:After Midnight.  Do not drink clear liquids: 4 Hours before arrival. 1:30 AM  Take these medicines the morning of surgery with A SIP OF WATER: AMLODIPINE, CARVEDILOL, HYDRALIZINE, (stop aspirin, multivitamin, calcium, coenzyme Q today)    Do not wear jewelry, make-up or nail polish.  Do not wear lotions, powders, or perfumes. You may wear deodorant.  Do not shave 48 hours prior to surgery.  Do not bring valuables to the hospital.  Contacts, dentures or bridgework may not be worn into surgery.  Leave suitcase in the car. After surgery it may be brought to your room.  For patients admitted to the hospital, checkout time is 11:00 AM the day of discharge.     Special Instructions: CHG Shower Use Special Wash: 1/2 bottle night before surgery and 1/2 bottle morning of surgery.   Please read over the following fact sheets that you were given: Pain Booklet, Coughing and Deep Breathing and Surgical Site Infection Prevention, CHG info sheet, Blood Transfusion Information Sheet

## 2011-05-24 ENCOUNTER — Telehealth: Payer: Self-pay | Admitting: *Deleted

## 2011-05-24 DIAGNOSIS — N390 Urinary tract infection, site not specified: Secondary | ICD-10-CM

## 2011-05-24 MED ORDER — CIPROFLOXACIN HCL 500 MG PO TABS: 500.0000 mg | ORAL_TABLET | Freq: Two times a day (BID) | ORAL | Status: AC

## 2011-05-24 NOTE — Progress Notes (Signed)
[facility] PLEASE REVIEW LABS AND CXR

## 2011-05-24 NOTE — Consult Note (Signed)
Anesthesia:  65 year old female for AFBG with possible bilateral RA bypass on 05/25/11.  Hx is significant for CAD s/p CABG (LIMA to LAD, SVG to OM) on 12/08/09, DM, PAD, RA occlusive disease with poorly controlled HTN, hyperlipidemia, anxiety, and DJD.  Her Cardiologist is Dr. Martinique who cleared her for major vascular surgery (see his 04/04/11 Note and 05/08/11 telephone encounter).  Her last cath was on 12/01/09 and showed mod-sev CAD with normal LV function.  She had an echo pre-CABG on 11/30/10 and post CABG on 12/08/09 showing EF 40-45%, mild MR.  Her last stress test was pre-CABG.  I have reviewed her EKG from 04/04/11.  I was also asked to review her preoperative labs and CXR.  H/H 8.8/29.7, UA with mod leuk, neg nitrite, many bacteria.  Results were communicated to Dr. Kellie Simmering via Barron Schmid RN, and he is calling the patient in a RX for Cipro and wants the T&S changed to a T&C for 4 Units.

## 2011-05-24 NOTE — Telephone Encounter (Signed)
Norma Hail PA for short stay called re:Low H&H and many bacteria in U/A. Talked with Dr Kellie Simmering. He ordered Type & Cross 4 units which Norma [name] was going to order. He ordered Cipro 500 mg BID. Called pt and discussed all of this and she is to start the Cipro at lunch today.

## 2011-05-25 ENCOUNTER — Encounter (HOSPITAL_COMMUNITY)
Admission: RE | Disposition: A | Discharge status: Skilled Nursing Facility | Payer: Self-pay | Source: Ambulatory Visit | Attending: Vascular Surgery

## 2011-05-25 ENCOUNTER — Inpatient Hospital Stay (HOSPITAL_COMMUNITY): Payer: Medicare Other

## 2011-05-25 ENCOUNTER — Inpatient Hospital Stay (HOSPITAL_COMMUNITY): Payer: Medicare Other | Admitting: Vascular Surgery

## 2011-05-25 ENCOUNTER — Encounter (HOSPITAL_COMMUNITY): Payer: Self-pay | Admitting: Vascular Surgery

## 2011-05-25 ENCOUNTER — Other Ambulatory Visit: Payer: Self-pay

## 2011-05-25 ENCOUNTER — Encounter (HOSPITAL_COMMUNITY): Payer: Self-pay | Admitting: *Deleted

## 2011-05-25 ENCOUNTER — Inpatient Hospital Stay (HOSPITAL_COMMUNITY)
Admission: RE | Admit: 2011-05-25 | Discharge: 2011-06-08 | DRG: 237 | Disposition: A | Discharge status: Skilled Nursing Facility | Payer: Medicare Other | Source: Ambulatory Visit | Attending: Vascular Surgery | Admitting: Vascular Surgery

## 2011-05-25 DIAGNOSIS — E8779 Other fluid overload: Secondary | ICD-10-CM | POA: Diagnosis not present

## 2011-05-25 DIAGNOSIS — R5381 Other malaise: Secondary | ICD-10-CM | POA: Diagnosis present

## 2011-05-25 DIAGNOSIS — I70219 Atherosclerosis of native arteries of extremities with intermittent claudication, unspecified extremity: Principal | ICD-10-CM | POA: Diagnosis present

## 2011-05-25 DIAGNOSIS — D62 Acute posthemorrhagic anemia: Secondary | ICD-10-CM | POA: Diagnosis not present

## 2011-05-25 DIAGNOSIS — I509 Heart failure, unspecified: Secondary | ICD-10-CM

## 2011-05-25 DIAGNOSIS — Y9210 Unspecified residential institution as the place of occurrence of the external cause: Secondary | ICD-10-CM | POA: Diagnosis not present

## 2011-05-25 DIAGNOSIS — I7409 Other arterial embolism and thrombosis of abdominal aorta: Secondary | ICD-10-CM

## 2011-05-25 DIAGNOSIS — K929 Disease of digestive system, unspecified: Secondary | ICD-10-CM | POA: Diagnosis not present

## 2011-05-25 DIAGNOSIS — J9600 Acute respiratory failure, unspecified whether with hypoxia or hypercapnia: Secondary | ICD-10-CM | POA: Diagnosis not present

## 2011-05-25 DIAGNOSIS — I2510 Atherosclerotic heart disease of native coronary artery without angina pectoris: Secondary | ICD-10-CM | POA: Diagnosis present

## 2011-05-25 DIAGNOSIS — Z95828 Presence of other vascular implants and grafts: Secondary | ICD-10-CM

## 2011-05-25 DIAGNOSIS — E785 Hyperlipidemia, unspecified: Secondary | ICD-10-CM | POA: Diagnosis present

## 2011-05-25 DIAGNOSIS — Z9911 Dependence on respirator [ventilator] status: Secondary | ICD-10-CM

## 2011-05-25 DIAGNOSIS — I700 Atherosclerosis of aorta: Secondary | ICD-10-CM | POA: Diagnosis present

## 2011-05-25 DIAGNOSIS — N2889 Other specified disorders of kidney and ureter: Secondary | ICD-10-CM

## 2011-05-25 DIAGNOSIS — E119 Type 2 diabetes mellitus without complications: Secondary | ICD-10-CM | POA: Diagnosis present

## 2011-05-25 DIAGNOSIS — Y832 Surgical operation with anastomosis, bypass or graft as the cause of abnormal reaction of the patient, or of later complication, without mention of misadventure at the time of the procedure: Secondary | ICD-10-CM | POA: Diagnosis not present

## 2011-05-25 DIAGNOSIS — Z79899 Other long term (current) drug therapy: Secondary | ICD-10-CM

## 2011-05-25 DIAGNOSIS — Z8249 Family history of ischemic heart disease and other diseases of the circulatory system: Secondary | ICD-10-CM

## 2011-05-25 DIAGNOSIS — I10 Essential (primary) hypertension: Secondary | ICD-10-CM | POA: Diagnosis present

## 2011-05-25 DIAGNOSIS — N184 Chronic kidney disease, stage 4 (severe): Secondary | ICD-10-CM | POA: Diagnosis not present

## 2011-05-25 DIAGNOSIS — Z794 Long term (current) use of insulin: Secondary | ICD-10-CM

## 2011-05-25 DIAGNOSIS — Z7982 Long term (current) use of aspirin: Secondary | ICD-10-CM

## 2011-05-25 DIAGNOSIS — N170 Acute kidney failure with tubular necrosis: Secondary | ICD-10-CM | POA: Diagnosis not present

## 2011-05-25 DIAGNOSIS — N179 Acute kidney failure, unspecified: Secondary | ICD-10-CM

## 2011-05-25 DIAGNOSIS — I739 Peripheral vascular disease, unspecified: Secondary | ICD-10-CM | POA: Diagnosis present

## 2011-05-25 DIAGNOSIS — I7092 Chronic total occlusion of artery of the extremities: Secondary | ICD-10-CM | POA: Diagnosis present

## 2011-05-25 DIAGNOSIS — I498 Other specified cardiac arrhythmias: Secondary | ICD-10-CM | POA: Diagnosis present

## 2011-05-25 DIAGNOSIS — Z6841 Body Mass Index (BMI) 40.0 and over, adult: Secondary | ICD-10-CM

## 2011-05-25 DIAGNOSIS — Z87891 Personal history of nicotine dependence: Secondary | ICD-10-CM

## 2011-05-25 DIAGNOSIS — J95821 Acute postprocedural respiratory failure: Secondary | ICD-10-CM | POA: Diagnosis not present

## 2011-05-25 DIAGNOSIS — K560 Paralytic ileus: Secondary | ICD-10-CM | POA: Diagnosis not present

## 2011-05-25 DIAGNOSIS — Z882 Allergy status to sulfonamides status: Secondary | ICD-10-CM

## 2011-05-25 DIAGNOSIS — R34 Anuria and oliguria: Secondary | ICD-10-CM | POA: Diagnosis not present

## 2011-05-25 DIAGNOSIS — F411 Generalized anxiety disorder: Secondary | ICD-10-CM | POA: Diagnosis present

## 2011-05-25 HISTORY — PX: AORTA - BILATERAL FEMORAL ARTERY BYPASS GRAFT: SHX1175

## 2011-05-25 HISTORY — PX: PR VEIN BYPASS GRAFT,AORTO-FEM-POP: 35551

## 2011-05-25 LAB — BLOOD GAS, ARTERIAL
Acid-base deficit: 4.3 mmol/L — ABNORMAL HIGH (ref 0.0–2.0)
Drawn by: 31996
FIO2: 0.6 %
MECHVT: 550 mL
PEEP: 5 cmH2O
RATE: 14 resp/min
pCO2 arterial: 37.6 mmHg (ref 35.0–45.0)

## 2011-05-25 LAB — APTT: aPTT: 33 seconds (ref 24–37)

## 2011-05-25 LAB — CBC
HCT: 36.1 % (ref 36.0–46.0)
HCT: 36.6 % (ref 36.0–46.0)
Hemoglobin: 12.2 g/dL (ref 12.0–15.0)
MCH: 27.3 pg (ref 26.0–34.0)
MCHC: 32.4 g/dL (ref 30.0–36.0)
MCV: 84.1 fL (ref 78.0–100.0)
MCV: 83.4 fL (ref 78.0–100.0)
Platelets: 175 10*3/uL (ref 150–400)
RBC: 4.39 MIL/uL (ref 3.87–5.11)
RDW: 16.9 % — ABNORMAL HIGH (ref 11.5–15.5)
WBC: 11.3 10*3/uL — ABNORMAL HIGH (ref 4.0–10.5)

## 2011-05-25 LAB — POCT I-STAT 7, (LYTES, BLD GAS, ICA,H+H)
Acid-base deficit: 7 mmol/L — ABNORMAL HIGH (ref 0.0–2.0)
Acid-base deficit: 5 mmol/L — ABNORMAL HIGH (ref 0.0–2.0)
Bicarbonate: 20.1 mEq/L (ref 20.0–24.0)
Bicarbonate: 21.5 mEq/L (ref 20.0–24.0)
Calcium, Ion: 1.1 mmol/L — ABNORMAL LOW (ref 1.12–1.32)
HCT: 28 % — ABNORMAL LOW (ref 36.0–46.0)
HCT: 38 % (ref 36.0–46.0)
Hemoglobin: 9.5 g/dL — ABNORMAL LOW (ref 12.0–15.0)
O2 Saturation: 99 %
O2 Saturation: 100 %
Potassium: 5.2 mEq/L — ABNORMAL HIGH (ref 3.5–5.1)
Sodium: 140 mEq/L (ref 135–145)
Sodium: 141 mEq/L (ref 135–145)
TCO2: 21 mmol/L (ref 0–100)
TCO2: 23 mmol/L (ref 0–100)
pH, Arterial: 7.311 — ABNORMAL LOW (ref 7.350–7.400)
pO2, Arterial: 161 mmHg — ABNORMAL HIGH (ref 80.0–100.0)
pO2, Arterial: 251 mmHg — ABNORMAL HIGH (ref 80.0–100.0)

## 2011-05-25 LAB — CARDIAC PANEL(CRET KIN+CKTOT+MB+TROPI)
Relative Index: 1.3 (ref 0.0–2.5)
Total CK: 492 U/L — ABNORMAL HIGH (ref 7–177)
Troponin I: <0.3 ng/mL (ref <0.30)

## 2011-05-25 LAB — PROTIME-INR
INR: 1.11 (ref 0.00–1.49)
Prothrombin Time: 16.2 seconds — ABNORMAL HIGH (ref 11.6–15.2)

## 2011-05-25 LAB — DIFFERENTIAL
Basophils Absolute: 0 10*3/uL (ref 0.0–0.1)
Basophils Relative: 0 % (ref 0–1)
Lymphocytes Relative: 8 % — ABNORMAL LOW (ref 12–46)
Neutro Abs: 9.4 10*3/uL — ABNORMAL HIGH (ref 1.7–7.7)

## 2011-05-25 LAB — CREATININE, SERUM
GFR calc Af Amer: 43 mL/min — ABNORMAL LOW (ref >90)
GFR calc non Af Amer: 37 mL/min — ABNORMAL LOW (ref >90)

## 2011-05-25 LAB — POCT I-STAT 4, (NA,K, GLUC, HGB,HCT)
Glucose, Bld: 336 mg/dL — ABNORMAL HIGH (ref 70–99)
Hemoglobin: 10.5 g/dL — ABNORMAL LOW (ref 12.0–15.0)
Potassium: 5.9 mEq/L — ABNORMAL HIGH (ref 3.5–5.1)
Sodium: 142 mEq/L (ref 135–145)

## 2011-05-25 LAB — BASIC METABOLIC PANEL
BUN: 23 mg/dL (ref 6–23)
Calcium: 7.3 mg/dL — ABNORMAL LOW (ref 8.4–10.5)
Chloride: 113 mEq/L — ABNORMAL HIGH (ref 96–112)
Creatinine, Ser: 1.31 mg/dL — ABNORMAL HIGH (ref 0.50–1.10)
GFR calc Af Amer: 48 mL/min — ABNORMAL LOW (ref >90)
GFR calc non Af Amer: 42 mL/min — ABNORMAL LOW (ref >90)

## 2011-05-25 SURGERY — CREATION, BYPASS, ARTERIAL, AORTA TO FEMORAL, BILATERAL, USING GRAFT
Anesthesia: General | Site: Abdomen | Wound class: Clean

## 2011-05-25 MED ORDER — POLYETHYLENE GLYCOL 3350 17 G PO PACK
17.0000 g | PACK | Freq: Every day | ORAL | Status: DC | PRN
  Filled 2011-05-25: qty 1

## 2011-05-25 MED ORDER — BISACODYL 10 MG RE SUPP: 10.0000 mg | Freq: Every day | RECTAL | Status: DC | PRN

## 2011-05-25 MED ORDER — INSULIN ASPART 100 UNIT/ML ~~LOC~~ SOLN
SUBCUTANEOUS | Status: DC | PRN
  Administered 2011-05-25: 15 [IU] via SUBCUTANEOUS

## 2011-05-25 MED ORDER — MAGNESIUM SULFATE 40 MG/ML IJ SOLN: 2.0000 g | Freq: Once | INTRAMUSCULAR | Status: AC | PRN

## 2011-05-25 MED ORDER — MIDAZOLAM HCL 2 MG/2ML IJ SOLN
1.0000 mg | INTRAMUSCULAR | Status: DC | PRN
  Administered 2011-05-27 (×2): 1 mg via INTRAVENOUS
  Filled 2011-05-25 (×2): qty 2

## 2011-05-25 MED ORDER — DOPAMINE-DEXTROSE 3.2-5 MG/ML-% IV SOLN
INTRAVENOUS | Status: DC | PRN
  Administered 2011-05-25: 6 ug/kg/min via INTRAVENOUS

## 2011-05-25 MED ORDER — SODIUM CHLORIDE 0.45 % IV SOLN
INTRAVENOUS | Status: DC
  Administered 2011-05-26 (×2): 10 mL/h via INTRAVENOUS
  Administered 2011-05-28 – 2011-06-01 (×3): via INTRAVENOUS

## 2011-05-25 MED ORDER — ONDANSETRON HCL 4 MG/2ML IJ SOLN: 4.0000 mg | Freq: Four times a day (QID) | INTRAMUSCULAR | Status: DC | PRN

## 2011-05-25 MED ORDER — POTASSIUM CHLORIDE CRYS ER 20 MEQ PO TBCR: 20.0000 meq | EXTENDED_RELEASE_TABLET | Freq: Once | ORAL | Status: AC | PRN

## 2011-05-25 MED ORDER — FAMOTIDINE IN NACL 20-0.9 MG/50ML-% IV SOLN
20.0000 mg | Freq: Two times a day (BID) | INTRAVENOUS | Status: DC
  Administered 2011-05-25 – 2011-05-27 (×4): 20 mg via INTRAVENOUS
  Filled 2011-05-25 (×7): qty 50

## 2011-05-25 MED ORDER — LACTATED RINGERS IV SOLN
INTRAVENOUS | Status: DC | PRN
  Administered 2011-05-25: 08:00:00 via INTRAVENOUS

## 2011-05-25 MED ORDER — SODIUM CHLORIDE 0.9 % IV SOLN
INTRAVENOUS | Status: DC
  Administered 2011-05-25: 20:00:00 via INTRAVENOUS

## 2011-05-25 MED ORDER — SODIUM CHLORIDE 0.9 % IV SOLN
INTRAVENOUS | Status: DC | PRN
  Administered 2011-05-25 (×2): via INTRAVENOUS

## 2011-05-25 MED ORDER — PHENYLEPHRINE HCL 10 MG/ML IJ SOLN
10.0000 mg | INTRAMUSCULAR | Status: DC | PRN
  Administered 2011-05-25: 20 ug/min via INTRAVENOUS

## 2011-05-25 MED ORDER — VECURONIUM BROMIDE 10 MG IV SOLR
INTRAVENOUS | Status: DC | PRN
  Administered 2011-05-25 (×3): 2 mg via INTRAVENOUS
  Administered 2011-05-25: 3 mg via INTRAVENOUS
  Administered 2011-05-25: 2 mg via INTRAVENOUS

## 2011-05-25 MED ORDER — PROPOFOL 10 MG/ML IV EMUL
INTRAVENOUS | Status: DC | PRN
  Administered 2011-05-25: 30 ug/kg/min via INTRAVENOUS

## 2011-05-25 MED ORDER — LABETALOL HCL 5 MG/ML IV SOLN: 10.0000 mg | INTRAVENOUS | Status: DC | PRN

## 2011-05-25 MED ORDER — DEXTROSE-NACL 5-0.45 % IV SOLN: INTRAVENOUS | Status: DC

## 2011-05-25 MED ORDER — SODIUM CHLORIDE 0.9 % IV SOLN: INTRAVENOUS | Status: DC

## 2011-05-25 MED ORDER — MAGNESIUM HYDROXIDE 400 MG/5ML PO SUSP: 30.0000 mL | ORAL | Status: DC | PRN

## 2011-05-25 MED ORDER — INSULIN ASPART 100 UNIT/ML ~~LOC~~ SOLN
0.0000 [IU] | SUBCUTANEOUS | Status: DC
  Filled 2011-05-25: qty 3

## 2011-05-25 MED ORDER — DOCUSATE SODIUM 100 MG PO CAPS
100.0000 mg | ORAL_CAPSULE | Freq: Every day | ORAL | Status: DC
Start: 2011-05-26 — End: 2011-05-26

## 2011-05-25 MED ORDER — SODIUM CHLORIDE 0.9 % IR SOLN
Status: DC | PRN
  Administered 2011-05-25: 09:00:00

## 2011-05-25 MED ORDER — ACETAMINOPHEN 650 MG RE SUPP: 325.0000 mg | RECTAL | Status: DC | PRN

## 2011-05-25 MED ORDER — DEXTROSE 50 % IV SOLN: 25.0000 mL | INTRAVENOUS | Status: DC | PRN

## 2011-05-25 MED ORDER — DEXTROSE 10 % IV SOLN: INTRAVENOUS | Status: DC

## 2011-05-25 MED ORDER — ACETAMINOPHEN 325 MG PO TABS
325.0000 mg | ORAL_TABLET | ORAL | Status: DC | PRN
  Administered 2011-05-31: 325 mg via ORAL
  Administered 2011-05-31 – 2011-06-02 (×3): 650 mg via ORAL
  Filled 2011-05-25 (×4): qty 2
  Filled 2011-05-25: qty 1
  Filled 2011-05-25: qty 2

## 2011-05-25 MED ORDER — SODIUM CHLORIDE 0.9 % IR SOLN
Status: DC | PRN
  Administered 2011-05-25: 1000 mL

## 2011-05-25 MED ORDER — EPHEDRINE SULFATE 50 MG/ML IJ SOLN
INTRAMUSCULAR | Status: DC | PRN
  Administered 2011-05-25: 5 mg via INTRAVENOUS

## 2011-05-25 MED ORDER — DOPAMINE-DEXTROSE 3.2-5 MG/ML-% IV SOLN
2.0000 ug/kg/min | INTRAVENOUS | Status: DC
  Administered 2011-05-25: 6 ug/kg/min via INTRAVENOUS
  Filled 2011-05-25 (×2): qty 250

## 2011-05-25 MED ORDER — METOPROLOL TARTRATE 1 MG/ML IV SOLN
5.0000 mg | Freq: Four times a day (QID) | INTRAVENOUS | Status: DC
  Administered 2011-05-26 – 2011-05-29 (×12): 5 mg via INTRAVENOUS
  Filled 2011-05-25 (×19): qty 5

## 2011-05-25 MED ORDER — FENTANYL CITRATE 0.05 MG/ML IJ SOLN
25.0000 ug | INTRAMUSCULAR | Status: DC | PRN
  Administered 2011-05-25 – 2011-05-26 (×3): 50 ug via INTRAVENOUS
  Administered 2011-05-27: 25 ug via INTRAVENOUS
  Administered 2011-05-28 (×2): 50 ug via INTRAVENOUS
  Administered 2011-05-28 (×2): 25 ug via INTRAVENOUS
  Administered 2011-05-28: 50 ug via INTRAVENOUS
  Filled 2011-05-25 (×7): qty 2

## 2011-05-25 MED ORDER — PROPOFOL 10 MG/ML IV EMUL
5.0000 ug/kg/min | INTRAVENOUS | Status: DC
  Administered 2011-05-25: 30 ug/kg/min via INTRAVENOUS
  Administered 2011-05-25: 35 ug/kg/min via INTRAVENOUS
  Administered 2011-05-26: 1000 mg via INTRAVENOUS
  Administered 2011-05-26: 24.894 ug/kg/min via INTRAVENOUS
  Administered 2011-05-26: 35 ug/kg/min via INTRAVENOUS
  Filled 2011-05-25 (×3): qty 100

## 2011-05-25 MED ORDER — PROTAMINE SULFATE 10 MG/ML IV SOLN
INTRAVENOUS | Status: DC | PRN
  Administered 2011-05-25 (×2): 50 mg via INTRAVENOUS

## 2011-05-25 MED ORDER — MANNITOL 20 % IV SOLN
INTRAVENOUS | Status: DC | PRN
  Administered 2011-05-25: 11:00:00 via INTRAVENOUS

## 2011-05-25 MED ORDER — DOPAMINE-DEXTROSE 3.2-5 MG/ML-% IV SOLN: 2.0000 ug/kg/min | INTRAVENOUS | Status: DC

## 2011-05-25 MED ORDER — ROCURONIUM BROMIDE 100 MG/10ML IV SOLN
INTRAVENOUS | Status: DC | PRN
  Administered 2011-05-25: 50 mg via INTRAVENOUS

## 2011-05-25 MED ORDER — HEPARIN SODIUM (PORCINE) 1000 UNIT/ML IJ SOLN
INTRAMUSCULAR | Status: DC | PRN
  Administered 2011-05-25: 5000 [IU] via INTRAVENOUS
  Administered 2011-05-25: 2000 [IU] via INTRAVENOUS

## 2011-05-25 MED ORDER — PANTOPRAZOLE SODIUM 40 MG IV SOLR
40.0000 mg | INTRAVENOUS | Status: DC
  Administered 2011-05-26 – 2011-05-28 (×3): 40 mg via INTRAVENOUS
  Filled 2011-05-25 (×5): qty 40

## 2011-05-25 MED ORDER — PROPOFOL 10 MG/ML IV EMUL
INTRAVENOUS | Status: DC | PRN
  Administered 2011-05-25: 140 mg via INTRAVENOUS

## 2011-05-25 MED ORDER — SODIUM BICARBONATE 8.4 % IV SOLN
INTRAVENOUS | Status: DC | PRN
  Administered 2011-05-25 (×2): 50 meq via INTRAVENOUS

## 2011-05-25 MED ORDER — SODIUM CHLORIDE 0.9 % IV SOLN: 500.0000 mL | Freq: Once | INTRAVENOUS | Status: AC | PRN

## 2011-05-25 MED ORDER — ALUM & MAG HYDROXIDE-SIMETH 400-400-40 MG/5ML PO SUSP
15.0000 mL | ORAL | Status: DC | PRN
  Filled 2011-05-25: qty 30

## 2011-05-25 MED ORDER — PROPOFOL 10 MG/ML IV EMUL
INTRAVENOUS | Status: AC
  Filled 2011-05-25: qty 100

## 2011-05-25 MED ORDER — PROPOFOL 10 MG/ML IV EMUL: 5.0000 ug/kg/min | INTRAVENOUS | Status: DC

## 2011-05-25 MED ORDER — MIDAZOLAM HCL 5 MG/5ML IJ SOLN
INTRAMUSCULAR | Status: DC | PRN
  Administered 2011-05-25: 2 mg via INTRAVENOUS

## 2011-05-25 MED ORDER — PHENOL 1.4 % MT LIQD: 1.0000 | OROMUCOSAL | Status: DC | PRN

## 2011-05-25 MED ORDER — FUROSEMIDE 10 MG/ML IJ SOLN
INTRAMUSCULAR | Status: DC | PRN
  Administered 2011-05-25: 80 mg via INTRAMUSCULAR
  Administered 2011-05-25: 40 mg via INTRAMUSCULAR

## 2011-05-25 MED ORDER — HYDRALAZINE HCL 20 MG/ML IJ SOLN
10.0000 mg | INTRAMUSCULAR | Status: DC | PRN
  Filled 2011-05-25: qty 0.5

## 2011-05-25 MED ORDER — DEXTROSE 5 % IV SOLN
1.5000 g | Freq: Two times a day (BID) | INTRAVENOUS | Status: AC
  Administered 2011-05-25 – 2011-05-26 (×2): 1.5 g via INTRAVENOUS
  Filled 2011-05-25 (×2): qty 1.5

## 2011-05-25 MED ORDER — FENTANYL CITRATE 0.05 MG/ML IJ SOLN
INTRAMUSCULAR | Status: DC | PRN
  Administered 2011-05-25: 50 ug via INTRAVENOUS
  Administered 2011-05-25: 100 ug via INTRAVENOUS
  Administered 2011-05-25 (×2): 50 ug via INTRAVENOUS

## 2011-05-25 MED ORDER — SODIUM CHLORIDE 0.9 % IV SOLN
INTRAVENOUS | Status: DC
  Administered 2011-05-25: 4.3 [IU]/h via INTRAVENOUS
  Filled 2011-05-25: qty 1

## 2011-05-25 MED ORDER — DEXTROSE 5 % IV SOLN
1.5000 g | INTRAVENOUS | Status: DC | PRN
  Administered 2011-05-25: 1.5 g via INTRAVENOUS

## 2011-05-25 MED ORDER — METOPROLOL TARTRATE 1 MG/ML IV SOLN: 2.0000 mg | INTRAVENOUS | Status: DC | PRN

## 2011-05-25 MED ORDER — ENOXAPARIN SODIUM 30 MG/0.3ML ~~LOC~~ SOLN
30.0000 mg | SUBCUTANEOUS | Status: DC
  Filled 2011-05-25: qty 0.3

## 2011-05-25 SURGICAL SUPPLY — 70 items
ATTRACTOMAT 16X20 MAGNETIC DRP (DRAPES) ×3 IMPLANT
CANISTER SUCTION 2500CC (MISCELLANEOUS) ×3 IMPLANT
CLIP TI MEDIUM 24 (CLIP) ×6 IMPLANT
CLIP TI WIDE RED SMALL 24 (CLIP) ×6 IMPLANT
CLOTH BEACON ORANGE TIMEOUT ST (SAFETY) ×3 IMPLANT
COVER SURGICAL LIGHT HANDLE (MISCELLANEOUS) ×6 IMPLANT
DERMABOND ADHESIVE PROPEN (GAUZE/BANDAGES/DRESSINGS) ×2
DERMABOND ADVANCED .7 DNX6 (GAUZE/BANDAGES/DRESSINGS) ×4 IMPLANT
DRAPE WARM FLUID 44X44 (DRAPE) ×3 IMPLANT
DRSG COVADERM 4X14 (GAUZE/BANDAGES/DRESSINGS) ×3 IMPLANT
DRSG COVADERM 4X8 (GAUZE/BANDAGES/DRESSINGS) ×3 IMPLANT
ELECT BLADE 4.0 EZ CLEAN MEGAD (MISCELLANEOUS) ×3
ELECT BLADE 6.5 EXT (BLADE) ×3 IMPLANT
ELECT CAUTERY BLADE 6.4 (BLADE) ×3 IMPLANT
ELECT REM PT RETURN 9FT ADLT (ELECTROSURGICAL) ×6
ELECTRODE BLDE 4.0 EZ CLN MEGD (MISCELLANEOUS) ×2 IMPLANT
ELECTRODE REM PT RTRN 9FT ADLT (ELECTROSURGICAL) ×4 IMPLANT
FELT TEFLON 6X6 (MISCELLANEOUS) ×3 IMPLANT
GLOVE BIO SURGEON STRL SZ 6.5 (GLOVE) ×6 IMPLANT
GLOVE BIOGEL PI IND STRL 6.5 (GLOVE) ×12 IMPLANT
GLOVE BIOGEL PI IND STRL 7.0 (GLOVE) ×4 IMPLANT
GLOVE BIOGEL PI IND STRL 7.5 (GLOVE) ×12 IMPLANT
GLOVE BIOGEL PI INDICATOR 6.5 (GLOVE) ×6
GLOVE BIOGEL PI INDICATOR 7.0 (GLOVE) ×2
GLOVE BIOGEL PI INDICATOR 7.5 (GLOVE) ×6
GLOVE SKINSENSE NS SZ7.5 (GLOVE) ×6
GLOVE SKINSENSE STRL SZ7.5 (GLOVE) ×12 IMPLANT
GLOVE SS BIOGEL STRL SZ 7 (GLOVE) ×4 IMPLANT
GLOVE SUPERSENSE BIOGEL SZ 7 (GLOVE) ×2
GOWN PREVENTION PLUS XLARGE (GOWN DISPOSABLE) ×18 IMPLANT
GOWN STRL NON-REIN LRG LVL3 (GOWN DISPOSABLE) ×24 IMPLANT
GRAFT HEMASHIELD 14X8MM (Vascular Products) ×3 IMPLANT
INSERT FOGARTY 61MM (MISCELLANEOUS) ×3 IMPLANT
INSERT FOGARTY SM (MISCELLANEOUS) ×6 IMPLANT
KIT BASIN OR (CUSTOM PROCEDURE TRAY) ×3 IMPLANT
KIT ROOM TURNOVER OR (KITS) ×3 IMPLANT
LOOP VESSEL MAXI BLUE (MISCELLANEOUS) ×3 IMPLANT
LOOP VESSEL MINI RED (MISCELLANEOUS) ×6 IMPLANT
NS IRRIG 1000ML POUR BTL (IV SOLUTION) ×9 IMPLANT
PACK AORTA (CUSTOM PROCEDURE TRAY) ×3 IMPLANT
PAD ARMBOARD 7.5X6 YLW CONV (MISCELLANEOUS) ×3 IMPLANT
PENCIL BUTTON HOLSTER BLD 10FT (ELECTRODE) ×3 IMPLANT
SPECIMEN JAR MEDIUM (MISCELLANEOUS) ×3 IMPLANT
SPONGE LAP 18X18 X RAY DECT (DISPOSABLE) ×12 IMPLANT
SPONGE LAP 4X18 X RAY DECT (DISPOSABLE) ×3 IMPLANT
STAPLER VISISTAT 35W (STAPLE) ×6 IMPLANT
SUT ETHIBOND 5 LR DA (SUTURE) IMPLANT
SUT PROLENE 1 XLH 60 (SUTURE) ×12 IMPLANT
SUT PROLENE 3 0 SH1 36 (SUTURE) ×24 IMPLANT
SUT PROLENE 4 0 RB 1 (SUTURE)
SUT PROLENE 4-0 RB1 .5 CRCL 36 (SUTURE) IMPLANT
SUT PROLENE 5 0 C 1 36 (SUTURE) ×3 IMPLANT
SUT PROLENE 5 0 CC 1 (SUTURE) ×6 IMPLANT
SUT PROLENE 6 0 C 1 30 (SUTURE) ×3 IMPLANT
SUT PROLENE 6 0 CC (SUTURE) ×6 IMPLANT
SUT PROLENE 7 0 BV1 MDA (SUTURE) ×3 IMPLANT
SUT SILK 2 0 SH CR/8 (SUTURE) ×3 IMPLANT
SUT SILK 2 0 TIES 17X18 (SUTURE) ×1
SUT SILK 2-0 18XBRD TIE BLK (SUTURE) ×2 IMPLANT
SUT SILK 3 0 TIES 17X18 (SUTURE) ×1
SUT SILK 3-0 18XBRD TIE BLK (SUTURE) ×2 IMPLANT
SUT VIC AB 2-0 CTX 36 (SUTURE) ×12 IMPLANT
SUT VIC AB 3-0 MH 27 (SUTURE) ×6 IMPLANT
SUT VIC AB 3-0 SH 27 (SUTURE) ×2
SUT VIC AB 3-0 SH 27X BRD (SUTURE) ×4 IMPLANT
SUT VICRYL 4-0 PS2 18IN ABS (SUTURE) ×3 IMPLANT
TOWEL OR 17X24 6PK STRL BLUE (TOWEL DISPOSABLE) ×6 IMPLANT
TOWEL OR 17X26 10 PK STRL BLUE (TOWEL DISPOSABLE) ×6 IMPLANT
TRAY FOLEY CATH 14FRSI W/METER (CATHETERS) ×3 IMPLANT
WATER STERILE IRR 1000ML POUR (IV SOLUTION) ×6 IMPLANT

## 2011-05-25 NOTE — Consult Note (Signed)
Consulting MD:  Kellie Simmering [id number] Reason For Consult:  Post-op respiratory failure and  HISTORY of PRESENT ILLNESS:  Norma [name] is a 65 y.o. female admitted on 05/25/2011 for scheduled aorta bifemoral bypass graft endarterectomy mesenteric celiac/renal.  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 Trula Slade  which revealed that her right common iliac is now totally occluded also. 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 This does not involve flow to the celiac axis or superior mesenteric artery but is infringing on the flow to both renal arteries. She had a CT angiogram performed and the plaque in the supra-renal aorta had progressed since May of 2011 when she had her angiogram performed. Claudication symptoms are quite severe limiting her walking one quarter of a block.  Surgery lasted for approx 7 hours-required cross clamping of aorta for approx 1 hour and was returned to PACU on vent.  Noted minimal UOP and generalized anasarca in PACU.    LINES / TUBES 11/16 OETT>>> 11/16 R IJ Swan>>>  CULTURES  ABX  BEST PRACTICE GI: Protonix DVT: lovenox starting 11/18 Nutrition: NPO  PROTOCOLS / CONSULTANTS 11/16 Intermittent Sedation>>> 11/16 ICU SSI>>>  KEY EVENTS / STUDIES      Past Medical History  Diagnosis Date  . Claudication   . Diabetes mellitus   . Hypertension   . Hyperlipidemia   . DJD (degenerative joint disease)   . PAD (peripheral artery disease)   . Coronary artery disease   . Leg pain   . Carotid artery occlusion   . Obesity   . DDD (degenerative disc disease)   . DJD (degenerative joint disease)   . Shortness of breath     exertion  . Anxiety     Past Surgical History  Procedure Date  . Cardiac catheterization 12/01/2009    EF 65%  . Removal of fibrous cyst from right breast 10+ YEARS  . Retinal detachment surgery 10+ YEARS    LEFT  EYE  . Coronary artery bypass graft 12/08/2009    LIMA GRAFT TO THE DISTAL LAD AND SAPHENOUS VEIN GRAFT TO THE OBTUSE MARGINAL VESSEL  . Transthoracic echocardiogram 12/01/2009    EF 60-65%  . Cardiovascular stress test 11/28/2009    EF 75%    Family History  Problem Relation Age of Onset  . Hypertension Mother   . Coronary artery disease Father   . Hypertension Sister   . Cirrhosis Brother   . Kidney disease Daughter      reports that she quit smoking about 19 years ago. Her smoking use included Cigarettes. She quit after 20 years of use. She has never used smokeless tobacco. She reports that she does not drink alcohol or use illicit drugs.  Allergies  Allergen Reactions  . Iohexol      Code: RASH, Desc: pt called 1 day post scanning stating that skin was red and "itching all over" some what better but still had symptom.. instructed pt to take benadryl to relieve symptoms,per dr Alvester Chou.if any problems call back/mms, Onset Date: [id number]   . Sulfa Antibiotics     Medications Prior to Admission  Medication Dose Route Frequency Provider Last Rate Last Dose  . 0.9 %  sodium chloride infusion   Intravenous Continuous Mal Misty, MD      . DISCONTD: heparin 6,000 Units in sodium chloride irrigation 0.9 % 500 mL irrigation  PRN Mal Misty, MD      . DISCONTD: sodium chloride irrigation 0.9 %    PRN Mal Misty, MD   1,000 mL at 05/25/11 0841   Medications Prior to Admission  Medication Sig Dispense Refill  . aliskiren (TEKTURNA) 300 MG tablet Take 300 mg by mouth daily.       Marland Kitchen amLODipine (NORVASC) 10 MG tablet Take 10 mg by mouth daily.        Marland Kitchen aspirin 325 MG tablet Take 325 mg by mouth daily.        Marland Kitchen atorvastatin (LIPITOR) 20 MG tablet Take 20 mg by mouth daily.        . Calcium Carbonate-Vitamin D (CALCIUM + D PO) Take by mouth daily.        . carvedilol (COREG) 12.5 MG tablet Take 12.5 mg by mouth 2 (two) times daily with a meal.        . Coenzyme Q10 200 MG capsule  Take 200 mg by mouth daily.        . fish oil-omega-3 fatty acids 1000 MG capsule Take 1 g by mouth 2 (two) times daily.        Marland Kitchen glimepiride (AMARYL) 4 MG tablet Take 4 mg by mouth daily before breakfast.        . hydrALAZINE (APRESOLINE) 25 MG tablet Take 2 tablets (50 mg total) by mouth 3 (three) times daily.      . insulin aspart (NOVOLOG) 100 UNIT/ML injection Inject 4 Units into the skin 3 (three) times daily before meals.        . insulin glargine (LANTUS) 100 UNIT/ML injection Inject 36 Units into the skin 2 (two) times daily.        . metFORMIN (GLUCOPHAGE) 1000 MG tablet Take 1,000 mg by mouth 2 (two) times daily with a meal.        . Multiple Vitamin (MULTIVITAMIN) tablet Take 1 tablet by mouth daily.        . valsartan-hydrochlorothiazide (DIOVAN-HCT) 320-25 MG per tablet Take 1 tablet by mouth daily.         ROS: -unable to complete as patient is on vent  PHYICAL EXAM:  Blood pressure 137/76, pulse 50, temperature 98.4 F (36.9 C), temperature source Oral, resp. rate 18, SpO2 93.00%.  General: chronically ill, obese AAF on vent Neuro:sedated / intubated CV: s1s2 regular, bradycardic PULM: resp's even/non-labored on PRVC, lungs bilaterally coarse GI: obese/soft, bsx4 hypoactive, midline abd incision c/d/i Extremities:  Warm/dry, generalized anasarca  LABS  Post surgical labs pending  CBC    Component Value Date/Time   WBC 6.0 05/23/2011 1427   RBC 3.50* 05/23/2011 1427   HGB 9.5* 05/25/2011 1004   HCT 28.0* 05/25/2011 1004   PLT 254 05/23/2011 1427   MCV 84.9 05/23/2011 1427   MCH 25.1* 05/23/2011 1427   MCHC 29.6* 05/23/2011 1427   RDW 18.8* 05/23/2011 1427   LYMPHSABS 1.7 05/23/2011 1427   MONOABS 0.4 05/23/2011 1427   EOSABS 0.2 05/23/2011 1427   BASOSABS 0.0 05/23/2011 1427    BMET    Component Value Date/Time   NA 142 05/25/2011 1004   K 5.2* 05/25/2011 1004   CL 110 05/23/2011 1427   CO2 24 05/23/2011 1427   GLUCOSE 125* 05/23/2011 1427   BUN  21 05/23/2011 1427   CREATININE 0.98 05/23/2011 1427   CALCIUM 9.4 05/23/2011 1427   GFRNONAA 59* 05/23/2011 1427   GFRAA 69* 05/23/2011 1427  ABG    Component Value Date/Time   PHART 7.311* 05/25/2011 1004   HCO3 20.1 05/25/2011 1004   TCO2 21 05/25/2011 1004   ACIDBASEDEF 6.0* 05/25/2011 1004   O2SAT 99.0 05/25/2011 1004    RADIOLOGIC DATA 11/14 CXR (intra-op)>>>Cardiomegaly. Probable mild pulmonary vascular congestion. 48 CXR (post-op)>>>   ASSESSMENT/PLAN: Principal Problem:  *S/P aorto-bifemoral bypass surgery Active Problems:  PAD (peripheral artery disease)  Respiratory failure, acute   1. Post-op Respiratory failure s/p aorto-bifem bypass.  Prolonged surgical time due to extensive vascular disease in setting of morbidly obese.  PLAN: -full vent support  -f/u abg reviewed -f/u cxr pending -protonix  2.  S/P aorto-bifem bypass per Dr. Kellie Simmering PLAN: -Rec's per Dr. VVS -dopamine per VVS  3. Oliguria.  Required cross clamp of aorta for approx 1 hour.  Received 120 total lasix intra-op, 25 of mannitol, and was on 6 mcg's of dopamine during case.  PLAN: -monitor UOP -f/u labs pending -may require renal consult  -NS at 125 hour per VVS --has KCL in maintance IVF, will proceed with caution given oligura  4. DM / Hyperglycemia PLAN: -ICU SSI  5. Bradycardia -unclear if this is her baseline.  PLAN: -f/u EKG in am -cycle cardiac enzymes   Noe Gens, NP-C Pgr: [phone]  MD attending addendum: Pt seen and examined and database reviewed. I agree with above findings, assessment and plan. She needs a higher renal perfusion pressure, I suspect - titrate for MAP > 76mmHg. Since she is bradycardic, DA is a reasonable choice as a pressor If she becomes tachy with this or if she is inadequately responsive to it, will add or change to norepi. She appears markedly volume overloaded and PA pressures are mildly elevated. Will cut back on her IVFs. I am  concerned about the potassium in the IVFs given oliguria. Will remove this from maintenance fluids. High  Merton Border, MD;  PCCM service; Mobile [phone]   05/25/2011

## 2011-05-25 NOTE — OR Nursing (Signed)
No Bed In SICU, Patient to transfer to PACU on ventilator Post op.

## 2011-05-25 NOTE — Anesthesia Postprocedure Evaluation (Signed)
  Anesthesia Post-op Note  Patient: Norma [name]  Procedure(s) Performed:  AORTA BIFEMORAL BYPASS GRAFT  Patient Location: PACU  Anesthesia Type: General  Level of Consciousness: sedated and unresponsive  Airway and Oxygen Therapy: Patient remains intubated per anesthesia plan  Post-op Pain: none  Post-op Assessment: Post-op Vital signs reviewed, Patient's Cardiovascular Status Stable, Respiratory Function Stable, Patent Airway and Pain level controlled  Post-op Vital Signs: Reviewed and stable  Complications: No apparent anesthesia complications

## 2011-05-25 NOTE — Preoperative (Signed)
Beta Blockers   Reason not to administer Beta Blockers:Not Applicable 

## 2011-05-25 NOTE — Anesthesia Procedure Notes (Addendum)
Procedure Name: Intubation Date/Time: 05/25/2011 7:50 AM Performed by: Maeola Harman Pre-anesthesia Checklist: Patient identified, Emergency Drugs available, Suction available, Patient being monitored and Timeout performed Patient Re-evaluated:Patient Re-evaluated prior to inductionOxygen Delivery Method: Circle System Utilized Preoxygenation: Pre-oxygenation with 100% oxygen Intubation Type: IV induction Grade View: Grade II Tube type: Oral Tube size: 7.5 mm Number of attempts: 1 Airway Equipment and Method: stylet Placement Confirmation: ETT inserted through vocal cords under direct vision,  positive ETCO2 and breath sounds checked- equal and bilateral Secured at: 23 cm Tube secured with: Tape Dental Injury: Teeth and Oropharynx as per pre-operative assessment

## 2011-05-25 NOTE — Anesthesia Preprocedure Evaluation (Addendum)
Anesthesia Evaluation  Patient identified by MRN, date of birth, ID band Patient awake    Reviewed: Allergy & Precautions, H&P , NPO status , Patient's Chart, lab work & pertinent test results, reviewed documented beta blocker date and time   Airway Mallampati: I TM Distance: >3 FB Neck ROM: Full    Dental  (+) Teeth Intact and Dental Advisory Given   Pulmonary shortness of breath,    Pulmonary exam normal       Cardiovascular hypertension, Pt. on medications and Pt. on home beta blockers + CAD Regular Bradycardia    Neuro/Psych    GI/Hepatic   Endo/Other  Diabetes mellitus-, Poorly Controlled, Type 2, Insulin DependentMorbid obesity  Renal/GU      Musculoskeletal   Abdominal (+)  Abdomen: soft. Bowel sounds: normal.  Peds  Hematology   Anesthesia Other Findings   Reproductive/Obstetrics                        Anesthesia Physical Anesthesia Plan  ASA: IV  Anesthesia Plan: General   Post-op Pain Management:    Induction: Intravenous  Airway Management Planned: Oral ETT  Additional Equipment: Arterial line, CVP and PA Cath  Intra-op Plan:   Post-operative Plan: Possible Post-op intubation/ventilation  Informed Consent: I have reviewed the patients History and Physical, chart, labs and discussed the procedure including the risks, benefits and alternatives for the proposed anesthesia with the patient or authorized representative who has indicated his/her understanding and acceptance.   Dental advisory given  Plan Discussed with: CRNA and Surgeon  Anesthesia Plan Comments:         Anesthesia Quick Evaluation

## 2011-05-25 NOTE — H&P (View-Only) (Signed)
Subjective:     Patient ID: Norma [name], female   DOB: 1946/06/29, 65 y.o.   MRN: [id number]  HPI this 65 year old female returns today to discuss revascularization of her lower extremities. 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 This does not involve flow to the celiac axis or superior mesenteric artery but is infringing on the flow to both renal arteries. She had a CT angiogram performed last week which I have reviewed today. It appears that the plaque in the supra-renal aorta has progressed since May of 2011 when she had her angiogram performed. Claudication symptoms are quite severe limiting her walking one quarter of a block. She tolerated her cardiac surgery well last year and has been followed by Dr. Peter Martinique.  Past Medical History  Diagnosis Date  . Claudication   . Diabetes mellitus   . Hypertension   . Hyperlipidemia   . DJD (degenerative joint disease)   . PAD (peripheral artery disease)   . Coronary artery disease   . Leg pain   . Carotid artery occlusion   . Obesity   . DDD (degenerative disc disease)   . DJD (degenerative joint disease)     History  Substance Use Topics  . Smoking status: Former Smoker -- 20 years    Types: Cigarettes    Quit date: 07/10/1991  . Smokeless tobacco: Never Used  . Alcohol Use: No    Family History  Problem Relation Age of Onset  . Hypertension Mother   . Coronary artery disease Father   . Hypertension Sister   . Cirrhosis Brother   . Kidney disease Daughter     Allergies  Allergen Reactions  . Iohexol      Code: RASH, Desc: pt called 1 day post scanning stating that skin was red and "itching all over" some what better but still had symptom.. instructed pt to take  benadryl to relieve symptoms,per dr Alvester Chou.if any problems call back/mms, Onset Date: [id number]   . Sulfa Antibiotics     Current outpatient prescriptions:aliskiren (TEKTURNA) 300 MG tablet, Take 300 mg by mouth daily.  , Disp: , Rfl: ;  amLODipine (NORVASC) 10 MG tablet, Take 10 mg by mouth daily.  , Disp: , Rfl: ;  aspirin 325 MG tablet, Take 325 mg by mouth daily.  , Disp: , Rfl: ;  atorvastatin (LIPITOR) 20 MG tablet, Take 20 mg by mouth daily.  , Disp: , Rfl: ;  Calcium Carbonate-Vitamin D (CALCIUM + D PO), Take by mouth daily.  , Disp: , Rfl:  carvedilol (COREG) 12.5 MG tablet, Take 12.5 mg by mouth 2 (two) times daily with a meal.  , Disp: , Rfl: ;  Coenzyme Q10 200 MG capsule, Take 200 mg by mouth daily.  , Disp: , Rfl: ;  fish oil-omega-3 fatty acids 1000 MG capsule, Take 1 g by mouth 2 (two) times daily.  , Disp: , Rfl: ;  glimepiride (AMARYL) 4 MG tablet, Take 4 mg by mouth daily before breakfast.  , Disp: , Rfl:  hydrALAZINE (APRESOLINE) 25 MG tablet, Take 2 tablets (50 mg total) by mouth 3 (three) times daily., Disp: , Rfl: ;  insulin aspart (NOVOLOG) 100 UNIT/ML injection, Inject 4 Units into the skin 3 (three)  times daily before meals.  , Disp: , Rfl: ;  insulin glargine (LANTUS) 100 UNIT/ML injection, Inject 36 Units into the skin 2 (two) times daily.  , Disp: , Rfl:  metFORMIN (GLUCOPHAGE) 1000 MG tablet, Take 1,000 mg by mouth 2 (two) times daily with a meal.  , Disp: , Rfl: ;  Multiple Vitamin (MULTIVITAMIN) tablet, Take 1 tablet by mouth daily.  , Disp: , Rfl: ;  valsartan-hydrochlorothiazide (DIOVAN-HCT) 320-25 MG per tablet, Take 1 tablet by mouth daily.  , Disp: , Rfl:   BP 210/35  Pulse 82  Resp 20  Ht 5\' 2"  (1.575 m)  Wt 242 lb (109.77 kg)  BMI 44.26 kg/m2  Body mass index is 44.26 kg/(m^2).         Review of Systems she denies any chest pain but does have dyspnea on exertion. Implants of arthritis and joint pain. All other systems are negative the complete review of  systems     Objective:   Physical Exam blood pressure 210/35 heart rate 82 respirations 20 General she is an obese female is in no apparent distress alert and oriented x3 HEENT normal for age Lungs no rhonchi or wheezing Cardiovascular regular of the middle murmurs Abdomen obese no palpable masses. No evidence of previous surgery Lower extremity exam reveals absent femoral and distal pulses. 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 We'll contact Dr. Peter Martinique to see if further cardiac evaluation is needed prior to the surgery which is tentatively scheduled for November 16. Patient saw Dr. Martinique in late September of this year. Risks and benefits of this procedure were thoroughly discussed with the patient today including renal insufficiency, infection, bowel infarction, and death  she realizes that this is a very complex procedure but necessary because of her serious problem

## 2011-05-25 NOTE — Progress Notes (Signed)
ekg done 1505 pcxr done at [place]

## 2011-05-25 NOTE — Transfer of Care (Signed)
Immediate Anesthesia Transfer of Care Note  Patient: Norma [name]  Procedure(s) Performed:  AORTA BIFEMORAL BYPASS GRAFT  Patient Location: PACU  Anesthesia Type: General  Level of Consciousness: unresponsive  Airway & Oxygen Therapy: Patient remains intubated per anesthesia plan and Patient placed on Ventilator (see vital sign flow sheet for setting)  Post-op Assessment: Report given to PACU RN  Post vital signs: Reviewed and stable  Complications: No apparent anesthesia complications

## 2011-05-25 NOTE — Progress Notes (Signed)
eLink Physician-Brief Progress Note Patient Name: Norma [name] DOB: October 17, 1945 MRN: [id number]  Date of Service  05/25/2011   HPI/Events of Note   hyperglycemia  eICU Interventions  See orders on clarification of insulin drip   Intervention Category Major Interventions: Hyperglycemia - active titration of insulin therapy  Asencion Noble 05/25/2011, 7:09 PM

## 2011-05-25 NOTE — Progress Notes (Signed)
Ccm pa at bedside.

## 2011-05-25 NOTE — Op Note (Signed)
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 Kellie Simmering Co-surgeon Dr. Annamarie Major Asst. Dayle Points PA  Anesthesia Gen. endotracheal Estimated blood loss [id number] cc Complications none Procedure-the patient was taken to the operating room and placed in supine position after satisfactory monitoring lines were inserted by anesthesia including a Swan-Ganz catheter and a radial arterial line. The abdomen and groins and left chest were prepped with Betadine scrub and solution draped in routine sterile manner. Oblique incisions were made bilaterally in the suprainguinal area carried down through subcutaneous tissue and the common superficial and profunda femoris arteries were dissected free. All were easily disease or patent on the angiogram. Following this a long midline incision was made from xiphoid to pubis carried down through subcutaneous tissue and linea alba using the Bovie.  Cavity was entered and thoroughly explored. The stomach duodenum small bowel and colon are unremarkable. Gallbladder was smooth with no palpable stones. Liver appears normal no masses were palpable. Uterus was present and there were some fibroid-type tumors involving the uterus. Transverse colon was elevated the intestines reflected to the patient's right side exposing the retroperitoneal space. Retroperitoneum was incised exposing the aorta from the renal arteries to the bifurcation. It was concentrically calcified throughout. In order to gain exposure to both renal arteries SMA and celiac the left renal vein was divided being careful to restore flow through the left adrenal and gonadal branch. Both renal arteries were completely  dissected free out about 3-4 cm. Severe mesenteric artery were circumferentially controlled and dissected out about 4-5 cm. Celiac axis was also circumferentially dissected free and dissected out until its primary branches. Control of the supraceliac aorta was obtained. There continue to be a posterior plaque in this area but the most severe portion of the exophytic intraluminal plaque was located adjacent to the celiac axis SMA and bilateral renal arteries. Patient was maintained on a dopamine drip. She was also given 25 g of mannitol and heparinized. The supraceliac aorta was occluded and all other branches temporarily occluded with vascular clamps area and the aorta was transected about 3 cm distal to the renal arteries. There is concentric calcification. After performing an endarterectomy of the distal segment the aorta was oversewn with 2 layers of 3-0 Prolene buttressing this the 2 strips of felt. The proximal aortic stump was then opened longitudinally proximal to both renal arteries and up to the origin of the SMA. The lumen is filled with an exophytic type mass is atherosclerotic and adherent to the wall. Superficial endarterectomy was performed involving the orifice of both renal arteries which were partially obstructed as well as the posterior wall of the aorta above the celiac axis. This intraluminal debris was all were removed although there did continue to be a posterior plaque although formal deep plane endarterectomy was not performed. All of the visceral branches were then noted to be widely patent after exploring him with a right angle clamp and a 14 x 8 mm Hemashield Dacron graft was anastomosed end to and to the infrarenal aorta after reapproximating the longitudinal aortotomy with a continuous 6-0 Prolene suture. This was checked for leaks and none were present. Both limbs were delivered the retroperitoneal tunnels posterior to the ureters delivered into the inguinal lungs. The femoral vessels  were occluded with  vascular clamps bilaterally with longitudinal opening was made in the common femoral artery extended down to the origin of the superficial femoral artery. On the left side the endarterectomy was necessary with tacking down of the distal intima with 7-0 Prolene. This anastomosis were done with Dacron common femoral arteries bilaterally with 5-0 Prolene. The right leg was opened initially followed by the left leg with no significant hypotension there was excellent Doppler flow in both renal arteries, the SMA, celiac axis, and both superficial femoral and profunda femoris arteries. Patient did not respond with a diuresis after reestablishing flow to both kidneys after approximately 40 minutes of renal ischemia time therefore 20 of Lasix followup eventually 100 of Lasix was given. Patient did begin to make some urine for the end of the case. Following adequate hemostasis the wounds were closed in layers with 0 and 3-0 Vicryl in subcuticular fashion and Dermabond. Retroperitoneum was reapproximated with 3-0 Vicryl and following thorough irrigation of perineal cavity linea alba was closed with #1 Prolene. Skin was closed with staples sterile dressings applied patient taken to the recovery room on the ventilator in stable condition to she received 4 units of packed red blood cells, 400 cc of blood from the Cell Saver and 2 units of fresh frozen plasma.

## 2011-05-25 NOTE — Progress Notes (Signed)
Labs sent pt ptt cbc mg bmet abg

## 2011-05-25 NOTE — Progress Notes (Signed)
Report  To [facility] RN as caregiver.

## 2011-05-25 NOTE — Interval H&P Note (Signed)
History and Physical Interval Note:   05/25/2011   7:26 AM   Norma [name]  has presented today for surgery, with the diagnosis of PVD  The various methods of treatment have been discussed with the patient and family. After consideration of risks, benefits and other options for treatment, the patient has consented to  Procedure(s): AORTA BIFEMORAL BYPASS GRAFT ENDARTERECTOMY MESENTERIC CELIAC/RENAL as a surgical intervention .  The patients' history has been reviewed, patient examined, no change in status, stable for surgery.  I have reviewed the patients' chart and labs.  Questions were answered to the patient's satisfaction.     Mal Misty  MD

## 2011-05-26 ENCOUNTER — Other Ambulatory Visit: Payer: Self-pay

## 2011-05-26 ENCOUNTER — Inpatient Hospital Stay (HOSPITAL_COMMUNITY): Payer: Medicare Other

## 2011-05-26 LAB — CARDIAC PANEL(CRET KIN+CKTOT+MB+TROPI)
CK, MB: 8.9 ng/mL (ref 0.3–4.0)
Relative Index: 0.9 (ref 0.0–2.5)
Total CK: 1301 U/L — ABNORMAL HIGH (ref 7–177)

## 2011-05-26 LAB — COMPREHENSIVE METABOLIC PANEL
AST: 83 U/L — ABNORMAL HIGH (ref 0–37)
CO2: 20 mEq/L (ref 19–32)
Calcium: 7.7 mg/dL — ABNORMAL LOW (ref 8.4–10.5)
Creatinine, Ser: 1.81 mg/dL — ABNORMAL HIGH (ref 0.50–1.10)
GFR calc Af Amer: 33 mL/min — ABNORMAL LOW (ref >90)
GFR calc non Af Amer: 28 mL/min — ABNORMAL LOW (ref >90)
Total Protein: 4.9 g/dL — ABNORMAL LOW (ref 6.0–8.3)

## 2011-05-26 LAB — CBC
MCHC: 32.1 g/dL (ref 30.0–36.0)
Platelets: 164 10*3/uL (ref 150–400)
RDW: 17.3 % — ABNORMAL HIGH (ref 11.5–15.5)
WBC: 7.4 10*3/uL (ref 4.0–10.5)

## 2011-05-26 LAB — PREPARE FRESH FROZEN PLASMA

## 2011-05-26 LAB — AMYLASE: Amylase: 62 U/L (ref 0–105)

## 2011-05-26 LAB — BASIC METABOLIC PANEL
Calcium: 7.5 mg/dL — ABNORMAL LOW (ref 8.4–10.5)
Chloride: 111 mEq/L (ref 96–112)
Creatinine, Ser: 2.39 mg/dL — ABNORMAL HIGH (ref 0.50–1.10)
GFR calc Af Amer: 23 mL/min — ABNORMAL LOW (ref >90)
GFR calc non Af Amer: 20 mL/min — ABNORMAL LOW (ref >90)

## 2011-05-26 LAB — POCT I-STAT 3, ART BLOOD GAS (G3+)
Acid-base deficit: 6 mmol/L — ABNORMAL HIGH (ref 0.0–2.0)
Acid-base deficit: 5 mmol/L — ABNORMAL HIGH (ref 0.0–2.0)
Bicarbonate: 19.1 mEq/L — ABNORMAL LOW (ref 20.0–24.0)
O2 Saturation: 95 %
O2 Saturation: 95 %
Patient temperature: 37.2
TCO2: 20 mmol/L (ref 0–100)
TCO2: 20 mmol/L (ref 0–100)
pCO2 arterial: 31.8 mmHg — ABNORMAL LOW (ref 35.0–45.0)
pO2, Arterial: 79 mmHg — ABNORMAL LOW (ref 80.0–100.0)

## 2011-05-26 LAB — HEMOGLOBIN A1C
Hgb A1c MFr Bld: 5.5 % (ref <5.7)
Mean Plasma Glucose: 111 mg/dL (ref <117)

## 2011-05-26 LAB — GLUCOSE, CAPILLARY
Glucose-Capillary: 122 mg/dL — ABNORMAL HIGH (ref 70–99)
Glucose-Capillary: 134 mg/dL — ABNORMAL HIGH (ref 70–99)
Glucose-Capillary: 147 mg/dL — ABNORMAL HIGH (ref 70–99)
Glucose-Capillary: 169 mg/dL — ABNORMAL HIGH (ref 70–99)
Glucose-Capillary: 213 mg/dL — ABNORMAL HIGH (ref 70–99)

## 2011-05-26 LAB — CORTISOL: Cortisol, Plasma: 27.7 ug/dL

## 2011-05-26 MED ORDER — DOCUSATE SODIUM 50 MG/5ML PO LIQD
100.0000 mg | Freq: Every day | ORAL | Status: DC
  Administered 2011-05-27 – 2011-05-28 (×2): 100 mg
  Filled 2011-05-26 (×3): qty 10

## 2011-05-26 MED ORDER — PROPOFOL 10 MG/ML IV EMUL
INTRAVENOUS | Status: AC
  Administered 2011-05-27: 15 ug/kg/min
  Filled 2011-05-26: qty 100

## 2011-05-26 MED ORDER — PROPOFOL 10 MG/ML IV EMUL
INTRAVENOUS | Status: AC
  Administered 2011-05-26: 1000 mg via INTRAVENOUS
  Filled 2011-05-26: qty 100

## 2011-05-26 MED ORDER — FENTANYL CITRATE 0.05 MG/ML IJ SOLN
50.0000 ug | INTRAMUSCULAR | Status: DC | PRN
  Administered 2011-05-26 – 2011-05-27 (×5): 50 ug via INTRAVENOUS
  Administered 2011-05-27: 100 ug via INTRAVENOUS
  Administered 2011-05-27 – 2011-05-29 (×7): 50 ug via INTRAVENOUS
  Administered 2011-05-30: 100 ug via INTRAVENOUS
  Administered 2011-05-30 – 2011-05-31 (×7): 50 ug via INTRAVENOUS
  Administered 2011-05-31: 100 ug via INTRAVENOUS
  Administered 2011-06-01: 50 ug via INTRAVENOUS
  Administered 2011-06-01: 100 ug via INTRAVENOUS
  Administered 2011-06-02 – 2011-06-03 (×4): 50 ug via INTRAVENOUS
  Filled 2011-05-26 (×23): qty 2

## 2011-05-26 MED ORDER — INSULIN ASPART 100 UNIT/ML ~~LOC~~ SOLN
0.0000 [IU] | SUBCUTANEOUS | Status: DC
  Administered 2011-05-26 (×2): 1 [IU] via SUBCUTANEOUS
  Administered 2011-05-27: 3 [IU] via SUBCUTANEOUS
  Administered 2011-05-27 (×2): 1 [IU] via SUBCUTANEOUS
  Administered 2011-05-27: 3 [IU] via SUBCUTANEOUS
  Administered 2011-05-27 – 2011-05-29 (×13): 1 [IU] via SUBCUTANEOUS
  Administered 2011-05-29: 4 [IU] via SUBCUTANEOUS
  Administered 2011-05-30 (×2): 1 [IU] via SUBCUTANEOUS

## 2011-05-26 MED ORDER — PROPOFOL 10 MG/ML IV EMUL
INTRAVENOUS | Status: AC
  Administered 2011-05-26: 30 ug/kg/min
  Filled 2011-05-26: qty 100

## 2011-05-26 MED ORDER — PROPOFOL 10 MG/ML IV EMUL
INTRAVENOUS | Status: AC
  Administered 2011-05-26: 12:00:00
  Filled 2011-05-26: qty 100

## 2011-05-26 NOTE — Op Note (Signed)
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 Kellie Simmering Procedure:   transaortic endarterectomy of suprarenal aorta and bilateral renal artery orifices with insertion of aorto bifemoral bypass using 14 x 8 mm Hemashield Dacron graft  Anesthesia:  General Blood Loss:  See anesthesia record  Procedure:  Please see Dr. Evelena Leyden full op note for major details of the procedure.  The patient underwent aorto-bi-femoral bypass with a 14x8 dacron graft.  She also had an extensive endarterectomy of her supra-renal aorta, requiring supra-celiac clamping for approximately 50 minutes. I assisted throughout the entire case, and performed the left groin anastamosis.  This was done in an end to side fashion with 5-0 prolene suture.  I had to do a limited endarterectomy of the medial and posterior side of the common femoral artery, as the artery was too calcified and I could not get the suture needle to pass through the plaque.  I tacked down the residual plaque with 7-0 prolene sutures.  The anastamosis was to the distal common femoral artery extending onto the proximal superficial femoral artery  Disposition:  To PACU in stable condition.   Eldridge Abrahams [facility] [place] Office: [phone] Pager:  [phone]

## 2011-05-26 NOTE — Significant Event (Signed)
CRITICAL VALUE ALERT  Critical value received:  Ckmb=6.4  Date of notification:  05/25/2011  Time of notification:  2105  Critical value read back:yes  Nurse who received alert:  Adonis Housekeeper RN  MD notified (1st page):  Warren Lacy MD Kennon Rounds  Time of first page:  2107  MD notified (2nd page):  Time of second page:  Responding MD:    Time MD responded:

## 2011-05-26 NOTE — Progress Notes (Signed)
[facility]   Subjective  - POD #1  The patient is status post aortobifemoral bypass grafting an extensive endarterectomy of the suprarenal abdominal aorta. In order to accomplish this a supraceliac clamp was required for approximately 50 minutes. 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 A superficial endarterectomy was able to be accomplished. The aorta bifemoral bypass graft was sewn into the infrarenal abdominal aorta this was a 14 x 8 graft. Intraoperatively the patient became oliguric. She was maintained on mechanical ventilation overnight due to the extensive nature of her operation. There were no major events overnight.   Physical Exam:  General: Intubated and sedated. She does move all 4 extremities. Cardiovascular: Regular rate and rhythm, Doppler pedal signals. Abdomen: Incision is intact her abdomen is obese but soft groin wounds are soft there is some moisture underneath her pannus. Extremities are edematous but well perfused       Assessment/Plan:  POD #1, status post aortobifemoral bypass graft with extensive endarterectomy of the suprarenal abdominal aorta requiring supraceliac cross-clamp for approximately 15 minutes   Renal: Despite BN oliguric intraoperative and in the immediate postoperative period, the patient is making approximately 20 cc of urine per hour. Her creatinine is 1.8 this morning. We will continue to monitor this. I suspect she will have residual AP and are probably last for several days. She is on renal dose dopamine however we are in the process of weaning this off. Cardiovascular: The patient is off pressors with the exception of dopamine which will be weaned off today. We will add beta blockers as her heart rate indicated. Endocrine: The patient is currently maintained on insulin to cool tightly this to the office tolerated GI NG tube has had about 200 cc output overnight we will keep her n.p.o. for now.  She is been without diarrhea overnight. Prophylaxis: Lovenox we added for DVT prophylaxis. Pulmonary: Patient says fluid overloaded on x-ray we were to try to diurese her. She's not yet ready for extubation. Appreciate clinical his assistance with this very challenging a complicated patient.  [place]varez 05/26/2011 8:07 AM --    Intake/Output Summary (Last 24 hours) at 05/26/11 0807 Last data filed at 05/26/11 0700  Gross per 24 hour  Intake 9041.6 ml  Output   2609 ml  Net 6432.6 ml     Laboratory CBC    Component Value Date/Time   WBC 7.4 05/26/2011 0408   HGB 12.0 05/26/2011 0408   HCT 37.4 05/26/2011 0408   PLT 164 05/26/2011 0408    BMET    Component Value Date/Time   NA 143 05/26/2011 0408   K 3.6 05/26/2011 0408   CL 113* 05/26/2011 0408   CO2 20 05/26/2011 0408   GLUCOSE 110* 05/26/2011 0408   BUN 26* 05/26/2011 0408   CREATININE 1.81* 05/26/2011 0408   CALCIUM 7.7* 05/26/2011 0408   GFRNONAA 28* 05/26/2011 0408   GFRAA 33* 05/26/2011 0408    COAG Lab Results  Component Value Date   INR 1.27 05/25/2011   INR 1.11 05/25/2011   INR 1.30 12/08/2009   No results found for this basename: PTT    Antibiotics Anti-infectives     Start     Dose/Rate Route Frequency Ordered Stop   05/25/11 2200   cefUROXime (ZINACEF) 1.5 g in dextrose 5 % 50 mL IVPB        1.5 g 100 mL/hr over 30 Minutes Intravenous Every 12 hours 05/25/11 1924 05/26/11  2159           V. Leia Alf, M.D. [facility] [place] Office: [phone] Pager:  [phone]

## 2011-05-26 NOTE — Progress Notes (Signed)
Norma [name] is a 65 y.o. female admitted on 05/25/2011 for scheduled aorta bifemoral bypass graft endarterectomy mesenteric celiac/renal. 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 Trula Slade which revealed that her right common iliac is now totally occluded also. 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 This does not involve flow to the celiac axis or superior mesenteric artery but is infringing on the flow to both renal arteries. She had a CT angiogram performed and the plaque in the supra-renal aorta had progressed since May of 2011 when she had her angiogram performed. Claudication symptoms are quite severe limiting her walking one quarter of a block. Surgery lasted for approx 7 hours-required cross clamping of aorta for approx 1 hour and was returned to PACU on vent. Noted minimal UOP and generalized anasarca in PACU.   LINES / TUBES  11/16 OETT>>>  11/16 R IJ Swan>>>   CULTURES   ABX  Peri op cefuroxime 11/16  BEST PRACTICE GI: Protonix  DVT: lovenox starting 11/18  Nutrition: NPO   PROTOCOLS / CONSULTANTS  11/16 Intermittent Sedation>>>  11/16 ICU SSI>>>  11/17- remains on low dose dopamine, increased output on own   KEY EVENTS / STUDIES  PHYICAL EXAM:  Filed Vitals:   05/26/11 0700  BP:   Pulse: 60  Temp: 99.1 F (37.3 C)  Resp: 16      resp. rate 18, SpO2 93.00%.  General: chronically ill, vent, sedated  Neuro:sedated / intubated  CV: s1s2 regular, bradycardic  Mild 60 PULM: lungs bilaterally coarse  Crackles now nases GI: obese/soft, bsx4 hypoactive, midline abd incision c/d/i  Extremities: Warm/dry, generalized anasarca  LABS  Post surgical labs pending  CBC    Component  Value  Date/Time    WBC  6.0  05/23/2011 1427    RBC  3.50*  05/23/2011 1427    HGB  9.5*  05/25/2011 1004    HCT  28.0*  05/25/2011 1004    PLT  254  05/23/2011  1427    MCV  84.9  05/23/2011 1427    MCH  25.1*  05/23/2011 1427    MCHC  29.6*  05/23/2011 1427    RDW  18.8*  05/23/2011 1427    LYMPHSABS  1.7  05/23/2011 1427    MONOABS  0.4  05/23/2011 1427    EOSABS  0.2  05/23/2011 1427    BASOSABS  0.0  05/23/2011 1427    BMET    Component  Value  Date/Time    NA  142  05/25/2011 1004    K  5.2*  05/25/2011 1004    CL  110  05/23/2011 1427    CO2  24  05/23/2011 1427    GLUCOSE  125*  05/23/2011 1427    BUN  21  05/23/2011 1427    CREATININE  0.98  05/23/2011 1427    CALCIUM  9.4  05/23/2011 1427    GFRNONAA  59*  05/23/2011 1427    GFRAA  69*  05/23/2011 1427    ABG    Component  Value  Date/Time    PHART  7.311*  05/25/2011 1004    HCO3  20.1  05/25/2011 1004    TCO2  21  05/25/2011 1004    ACIDBASEDEF  6.0*  05/25/2011 1004    O2SAT  99.0  05/25/2011 1004     CBC  Component Value Date/Time   WBC 7.4 05/26/2011 0408   RBC 4.45 05/26/2011 0408   HGB 12.0 05/26/2011 0408   HCT 37.4 05/26/2011 0408   PLT 164 05/26/2011 0408   MCV 84.0 05/26/2011 0408   MCH 27.0 05/26/2011 0408   MCHC 32.1 05/26/2011 0408   RDW 17.3* 05/26/2011 0408   LYMPHSABS 0.9 05/25/2011 2006   MONOABS 1.1* 05/25/2011 2006   EOSABS 0.0 05/25/2011 2006   BASOSABS 0.0 05/25/2011 2006   BMET    Component Value Date/Time   NA 143 05/26/2011 0408   K 3.6 05/26/2011 0408   CL 113* 05/26/2011 0408   CO2 20 05/26/2011 0408   GLUCOSE 110* 05/26/2011 0408   BUN 26* 05/26/2011 0408   CREATININE 1.81* 05/26/2011 0408   CALCIUM 7.7* 05/26/2011 0408   GFRNONAA 28* 05/26/2011 0408   GFRAA 33* 05/26/2011 0408    arterial blood gases   RADIOLOGIC DATA  11/14 CXR (intra-op)>>>Cardiomegaly. Probable mild pulmonary vascular congestion.  1117 CXR (post-op)>>>edema, pa cwnl, ett wnl  ASSESSMENT/PLAN:  Principal Problem:  *S/P aorto-bifemoral bypass surgery  Active Problems:  PAD (peripheral artery disease)  Respiratory failure, acute   1.  Post-op Respiratory failure s/p aorto-bifem bypass. Prolonged surgical time due to extensive vascular disease in setting of morbidly obese.  PLAN: -last abg reviewed. She is listed as 8 cc/kg but she is only 5'3", at 55o cc?? Will correct this down to 7 cc/kg and esure at current minute vent by increasing rate abg to follow in 1 hr pcxr c/e edema, kvo for starters, may need lasix IF hy[poxia wrosen or acidosis related to edema Wedge confirms 18 kvo unlikley to be able to SBT today but will assess ability after abg pcxr in am   2. S/P aorto-bifem bypass per Dr. Kellie Simmering  PLAN:  -Rec's per Dr. VVS  -dopamine at 3 mics, will not change renal outcome, therefore if able to wean to off will. MAP 65 as goal , so dop ok to use for BP / brady support for now  3. Oliguria. Required cross clamp of aorta for approx 1 hour. Received 120 total lasix intra-op, 25 of mannitol, and was on 6 mcg's of dopamine during case.  PLAN:  Increased output, gross overlaod on PCXR bmet in afternoon kvo to 1/2 NS with rising Cl  4. DM / Hyperglycemia  PLAN: -ICU SSI  Off drip again Cortisol  5. Bradycardia -unclear if this is her baseline.  PLAN: -cycle cardiac enzymes Wedge assessment = 18,. Kvo, see pulm section Cortisol  dop to MAp goal ok for now  6. Sedation In shock, dc prop Add prn fent Add fent drip if needed  7. NPO lft in am   8. dvtr prev Lovenox, dose adjuseted, ow threshold to change to sub q hep with ARF

## 2011-05-26 NOTE — Progress Notes (Signed)
Patient is being discharged from PT/ OT/ SLP services secondary to:  Medical decline- will need to re-order to resume therapy services.  Progress and discharge plan and discussed with patient and no caregivers available and patient unable to participate in discharge planning.  [facility] [phone] (pager)

## 2011-05-27 ENCOUNTER — Inpatient Hospital Stay (HOSPITAL_COMMUNITY): Payer: Medicare Other

## 2011-05-27 DIAGNOSIS — I509 Heart failure, unspecified: Secondary | ICD-10-CM

## 2011-05-27 DIAGNOSIS — N179 Acute kidney failure, unspecified: Secondary | ICD-10-CM

## 2011-05-27 DIAGNOSIS — J9600 Acute respiratory failure, unspecified whether with hypoxia or hypercapnia: Secondary | ICD-10-CM

## 2011-05-27 DIAGNOSIS — Z9911 Dependence on respirator [ventilator] status: Secondary | ICD-10-CM

## 2011-05-27 LAB — GLUCOSE, CAPILLARY
Glucose-Capillary: 132 mg/dL — ABNORMAL HIGH (ref 70–99)
Glucose-Capillary: 168 mg/dL — ABNORMAL HIGH (ref 70–99)

## 2011-05-27 LAB — TYPE AND SCREEN
Antibody Screen: NEGATIVE
Unit division: 0
Unit division: 0
Unit division: 0
Unit division: 0
Unit division: 0

## 2011-05-27 LAB — POCT I-STAT 3, ART BLOOD GAS (G3+)
Acid-base deficit: 6 mmol/L — ABNORMAL HIGH (ref 0.0–2.0)
O2 Saturation: 94 %
Patient temperature: 97.5
TCO2: 19 mmol/L (ref 0–100)
pCO2 arterial: 30.7 mmHg — ABNORMAL LOW (ref 35.0–45.0)

## 2011-05-27 LAB — DIFFERENTIAL
Basophils Absolute: 0 10*3/uL (ref 0.0–0.1)
Basophils Relative: 0 % (ref 0–1)
Eosinophils Absolute: 0 10*3/uL (ref 0.0–0.7)
Neutro Abs: 10.6 10*3/uL — ABNORMAL HIGH (ref 1.7–7.7)
Neutrophils Relative %: 83 % — ABNORMAL HIGH (ref 43–77)

## 2011-05-27 LAB — CBC
HCT: 36.8 % (ref 36.0–46.0)
Hemoglobin: 11.7 g/dL — ABNORMAL LOW (ref 12.0–15.0)
MCV: 84.6 fL (ref 78.0–100.0)
RDW: 17.9 % — ABNORMAL HIGH (ref 11.5–15.5)
WBC: 12.9 10*3/uL — ABNORMAL HIGH (ref 4.0–10.5)

## 2011-05-27 LAB — COMPREHENSIVE METABOLIC PANEL
Alkaline Phosphatase: 77 U/L (ref 39–117)
BUN: 34 mg/dL — ABNORMAL HIGH (ref 6–23)
Chloride: 110 mEq/L (ref 96–112)
Creatinine, Ser: 2.75 mg/dL — ABNORMAL HIGH (ref 0.50–1.10)
GFR calc Af Amer: 20 mL/min — ABNORMAL LOW (ref >90)
GFR calc non Af Amer: 17 mL/min — ABNORMAL LOW (ref >90)
Glucose, Bld: 185 mg/dL — ABNORMAL HIGH (ref 70–99)
Potassium: 4.4 mEq/L (ref 3.5–5.1)
Total Bilirubin: 1.7 mg/dL — ABNORMAL HIGH (ref 0.3–1.2)

## 2011-05-27 LAB — BASIC METABOLIC PANEL
BUN: 40 mg/dL — ABNORMAL HIGH (ref 6–23)
Calcium: 8.1 mg/dL — ABNORMAL LOW (ref 8.4–10.5)
Chloride: 109 mEq/L (ref 96–112)
Creatinine, Ser: 3.21 mg/dL — ABNORMAL HIGH (ref 0.50–1.10)
GFR calc Af Amer: 16 mL/min — ABNORMAL LOW (ref >90)

## 2011-05-27 LAB — PROTIME-INR
INR: 1.2 (ref 0.00–1.49)
Prothrombin Time: 15.5 seconds — ABNORMAL HIGH (ref 11.6–15.2)

## 2011-05-27 LAB — APTT: aPTT: 32 seconds (ref 24–37)

## 2011-05-27 MED ORDER — CHLORHEXIDINE GLUCONATE 0.12 % MT SOLN
OROMUCOSAL | Status: AC
  Administered 2011-05-27: 15 mL
  Filled 2011-05-27: qty 15

## 2011-05-27 MED ORDER — PROPOFOL 10 MG/ML IV EMUL
INTRAVENOUS | Status: AC
  Administered 2011-05-27: 25 ug/kg/min
  Filled 2011-05-27: qty 100

## 2011-05-27 MED ORDER — FUROSEMIDE 10 MG/ML IJ SOLN
80.0000 mg | Freq: Two times a day (BID) | INTRAMUSCULAR | Status: DC
  Administered 2011-05-27 – 2011-05-28 (×3): 80 mg via INTRAVENOUS
  Filled 2011-05-27 (×5): qty 8

## 2011-05-27 MED ORDER — HYDRALAZINE HCL 20 MG/ML IJ SOLN
10.0000 mg | INTRAMUSCULAR | Status: DC | PRN
  Filled 2011-05-27 (×2): qty 0.5

## 2011-05-27 MED ORDER — FAMOTIDINE IN NACL 20-0.9 MG/50ML-% IV SOLN
20.0000 mg | INTRAVENOUS | Status: DC
  Administered 2011-05-28 – 2011-05-29 (×2): 20 mg via INTRAVENOUS
  Filled 2011-05-27 (×4): qty 50

## 2011-05-27 MED ORDER — HEPARIN SODIUM (PORCINE) 5000 UNIT/ML IJ SOLN
5000.0000 [IU] | Freq: Three times a day (TID) | INTRAMUSCULAR | Status: DC
  Administered 2011-05-27 – 2011-06-07 (×34): 5000 [IU] via SUBCUTANEOUS
  Filled 2011-05-27 (×40): qty 1

## 2011-05-27 NOTE — Procedures (Signed)
Extubation Procedure Note  Patient Details:   Name: Norma [name] DOB: June 17, 1946 MRN: [id number]    Evaluation  O2 sats: stable throughout Complications: No apparent complications Patient did tolerate procedure well. Bilateral Breath Sounds: Clear Suctioning:  (not indicated) Yes Pt self extubated.  Pt tolerated well.  Placed on 3l [place].  Will continue to monitor.l  Phillis Knack [facility] 05/27/2011, 4:27 PM

## 2011-05-27 NOTE — Progress Notes (Signed)
[facility]  Subjective  - POD #2   The patient is status post aortobifemoral bypass grafting an extensive endarterectomy of the suprarenal abdominal aorta. In order to accomplish this a supraceliac clamp was required for approximately 50 minutes. 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 A superficial endarterectomy was able to be accomplished. The aorta bifemoral bypass graft was sewn into the infrarenal abdominal aorta this was a 14 x 8 graft. The patient remains intubated. She does not complain of significant pain.   Physical Exam:  General: Intubated and sedated. She does move all 4 extremities.  Cardiovascular: Regular rate and rhythm, Doppler pedal signals.  Abdomen: Incision is intact her abdomen is obese but soft groin wounds are soft there is some moisture underneath her pannus. Extremities are edematous but well perfused with doppler signals        Assessment/Plan:  POD #2  Renal: Despite BN oliguric intraoperative and in the immediate postoperative period, the patient increased her urine output however it has dropped off day and she is getting Lasix.Marland Kitchen Her creatinine is 2.75 which has increased.. We will continue to monitor this. I suspect she will have residual ATN which will probably last for several days. Cardiovascular: The patient is off pressors and is somewhat bradycardic. A cardiac panel is being sent today.  Endocrine: The patient is currently maintained on insulin to cool tightly this to the office tolerated  GI NG tube is in place. I would not start trophic tube feeds today Prophylaxis: Lovenox has been discontinued in the setting of worsening renal function. Subcutaneous heparin has been added Pulmonary: Patient has fluid overloaded on x-ray we will try to diurese her. She's not yet ready for extubation.  Appreciate clinical his assistance with this very challenging a complicated patient.   [place] 05/27/2011 2:10 PM --  Filed Vitals:   05/27/11 1200  BP:   Pulse: 60  Temp: 98.3 F (36.8 C)  Resp: 23    Intake/Output Summary (Last 24 hours) at 05/27/11 1410 Last data filed at 05/27/11 1100  Gross per 24 hour  Intake  925.3 ml  Output    625 ml  Net  300.3 ml     Laboratory CBC    Component Value Date/Time   WBC 12.9* 05/27/2011 0455   HGB 11.7* 05/27/2011 0455   HCT 36.8 05/27/2011 0455   PLT 156 05/27/2011 0455    BMET    Component Value Date/Time   NA 141 05/27/2011 0455   K 4.4 05/27/2011 0455   CL 110 05/27/2011 0455   CO2 19 05/27/2011 0455   GLUCOSE 185* 05/27/2011 0455   BUN 34* 05/27/2011 0455   CREATININE 2.75* 05/27/2011 0455   CALCIUM 7.8* 05/27/2011 0455   GFRNONAA 17* 05/27/2011 0455   GFRAA 20* 05/27/2011 0455    COAG Lab Results  Component Value Date   INR 1.20 05/27/2011   INR 1.27 05/25/2011   INR 1.11 05/25/2011   No results found for this basename: PTT    Antibiotics Anti-infectives     Start     Dose/Rate Route Frequency Ordered Stop   05/25/11 2200   cefUROXime (ZINACEF) 1.5 g in dextrose 5 % 50 mL IVPB        1.5 g 100 mL/hr over 30 Minutes Intravenous Every 12 hours 05/25/11 1924 05/26/11 1026           V. Leia Alf, M.D. [facility] Office: [phone] Pager:  [phone]

## 2011-05-27 NOTE — Progress Notes (Signed)
Norma [name] is a 65 y.o. female admitted on 05/25/2011 for scheduled aorta bifemoral bypass graft endarterectomy mesenteric celiac/renal. 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 Trula Slade which revealed that her right common iliac is now totally occluded also. 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 This does not involve flow to the celiac axis or superior mesenteric artery but is infringing on the flow to both renal arteries. She had a CT angiogram performed and the plaque in the supra-renal aorta had progressed since May of 2011 when she had her angiogram performed. Claudication symptoms are quite severe limiting her walking one quarter of a block. Surgery lasted for approx 7 hours-required cross clamping of aorta for approx 1 hour and was returned to PACU on vent. Noted minimal UOP and generalized anasarca in PACU.   LINES / TUBES  11/16 OETT>>>  11/16 R IJ Swan>>>   CULTURES   ABX  Peri op cefuroxime 11/16  BEST PRACTICE GI: Protonix  DVT: lovenox starting 11/18  Nutrition: NPO   PROTOCOLS / CONSULTANTS  11/16 Intermittent Sedation>>>  11/16 ICU SSI>>>  11/17- remains on low dose dopamine, increased output on own 11/18- increased output further  KEY EVENTS / STUDIES  PHYICAL EXAM:  Filed Vitals:   05/27/11 0700  BP:   Pulse: 51  Temp: 97 F (36.1 C)  Resp: 22      resp. rate 18, SpO2 93.00%.  General: chronically ill, vent, sedated  Neuro: sedated / intubated  CV: s1s2 regular, bradycardic resolved PULM: lungs bil crackles GI: obese/soft, bsx4 hypoactive, midline abd incision c/d/i  Extremities: Warm/dry, generalized anasarca  LABS  Post surgical labs pending  CBC   ABG    CBC    Component Value Date/Time   WBC 12.9* 05/27/2011 0455   RBC 4.35 05/27/2011 0455   HGB 11.7* 05/27/2011 0455   HCT 36.8 05/27/2011 0455   PLT 156  05/27/2011 0455   MCV 84.6 05/27/2011 0455   MCH 26.9 05/27/2011 0455   MCHC 31.8 05/27/2011 0455   RDW 17.9* 05/27/2011 0455   LYMPHSABS 1.2 05/27/2011 0455   MONOABS 1.0 05/27/2011 0455   EOSABS 0.0 05/27/2011 0455   BASOSABS 0.0 05/27/2011 0455   BMET    Component Value Date/Time   NA 141 05/27/2011 0455   K 4.4 05/27/2011 0455   CL 110 05/27/2011 0455   CO2 19 05/27/2011 0455   GLUCOSE 185* 05/27/2011 0455   BUN 34* 05/27/2011 0455   CREATININE 2.75* 05/27/2011 0455   CALCIUM 7.8* 05/27/2011 0455   GFRNONAA 17* 05/27/2011 0455   GFRAA 20* 05/27/2011 0455    arterial blood gases   RADIOLOGIC DATA  11/14 CXR (intra-op)>>>Cardiomegaly. Probable mild pulmonary vascular congestion.  1117 CXR (post-op)>>>edema, pa cwnl, ett wnl  11/18- fluid in fissure, int edema, effusion, ett , pa wnl  ASSESSMENT/PLAN:  Principal Problem:  *S/P aorto-bifemoral bypass surgery  Active Problems:  PAD (peripheral artery disease)  Respiratory failure, acute   1. Post-op Respiratory failure s/p aorto-bifem bypass. Prolonged surgical time due to extensive vascular disease in setting of morbidly obese.  PLAN: -success to 7 cc/kg, rate 22 , minute vent no changes planned Increased fluid, start lasix to neg balance, will not change renal outcome Start lasix 80 bid pcxr in am  sbt planned as on min support, unlikley to extubate with such pos balance still WUA  mandatory  2. S/P aorto-bifem bypass per Dr. Kellie Simmering  PLAN:  -Rec's per Dr. VVS  -dopamine to off, likely  as prop off  3. Oliguria. Required cross clamp of aorta for approx 1 hr, ARF PLAN:  Increased output, gross overlaod on PCxr, and still positive balance bmet in afternoon again  kvo to 1/2 NS Add lasix  4. DM / Hyperglycemia  PLAN: -Insulin drip aqssess cortisol as remain on pressors  5. Bradycardia, shock PLAN: -cycle cardiac enzymes Wedge assessment = 18,. Kvo, PAD are 20-25, lasix Cortisol  Now, if less 20 add  stress steroids dop to MAp goals, hope to off today  6. Sedation In shock, dc prop, awaited but mandatory as still on pressors  7. NPO Would like to start feeding if ok by surgery ppi  8. dvtr prev Lovenox, dc in setting crt rising and clearance less 30 Add sub q hep   Ccm 35 min Titus Mould

## 2011-05-28 ENCOUNTER — Inpatient Hospital Stay (HOSPITAL_COMMUNITY): Payer: Medicare Other

## 2011-05-28 DIAGNOSIS — N184 Chronic kidney disease, stage 4 (severe): Secondary | ICD-10-CM | POA: Diagnosis not present

## 2011-05-28 DIAGNOSIS — I739 Peripheral vascular disease, unspecified: Secondary | ICD-10-CM

## 2011-05-28 LAB — POCT I-STAT 3, ART BLOOD GAS (G3+)
TCO2: 19 mmol/L (ref 0–100)
pCO2 arterial: 32 mmHg — ABNORMAL LOW (ref 35.0–45.0)
pH, Arterial: 7.364 (ref 7.350–7.400)

## 2011-05-28 LAB — GLUCOSE, CAPILLARY
Glucose-Capillary: 137 mg/dL — ABNORMAL HIGH (ref 70–99)
Glucose-Capillary: 136 mg/dL — ABNORMAL HIGH (ref 70–99)
Glucose-Capillary: 137 mg/dL — ABNORMAL HIGH (ref 70–99)
Glucose-Capillary: 146 mg/dL — ABNORMAL HIGH (ref 70–99)
Glucose-Capillary: 133 mg/dL — ABNORMAL HIGH (ref 70–99)

## 2011-05-28 LAB — DIFFERENTIAL
Basophils Absolute: 0 10*3/uL (ref 0.0–0.1)
Basophils Relative: 0 % (ref 0–1)
Eosinophils Absolute: 0.1 10*3/uL (ref 0.0–0.7)
Neutrophils Relative %: 82 % — ABNORMAL HIGH (ref 43–77)

## 2011-05-28 LAB — URINE MICROSCOPIC-ADD ON

## 2011-05-28 LAB — COMPREHENSIVE METABOLIC PANEL
ALT: 49 U/L — ABNORMAL HIGH (ref 0–35)
AST: 78 U/L — ABNORMAL HIGH (ref 0–37)
CO2: 20 mEq/L (ref 19–32)
Chloride: 109 mEq/L (ref 96–112)
Creatinine, Ser: 3.57 mg/dL — ABNORMAL HIGH (ref 0.50–1.10)
GFR calc Af Amer: 14 mL/min — ABNORMAL LOW (ref >90)
GFR calc non Af Amer: 12 mL/min — ABNORMAL LOW (ref >90)
Glucose, Bld: 144 mg/dL — ABNORMAL HIGH (ref 70–99)
Total Bilirubin: 1.6 mg/dL — ABNORMAL HIGH (ref 0.3–1.2)

## 2011-05-28 LAB — URINALYSIS, ROUTINE W REFLEX MICROSCOPIC
Glucose, UA: NEGATIVE mg/dL
Leukocytes, UA: NEGATIVE
Nitrite: NEGATIVE
Protein, ur: NEGATIVE mg/dL
Urobilinogen, UA: 0.2 mg/dL (ref 0.0–1.0)

## 2011-05-28 LAB — PROTIME-INR
INR: 1.2 (ref 0.00–1.49)
Prothrombin Time: 15.5 seconds — ABNORMAL HIGH (ref 11.6–15.2)

## 2011-05-28 LAB — CBC
MCH: 27.3 pg (ref 26.0–34.0)
MCHC: 32.2 g/dL (ref 30.0–36.0)
Platelets: 137 10*3/uL — ABNORMAL LOW (ref 150–400)

## 2011-05-28 MED ORDER — BISACODYL 10 MG RE SUPP
10.0000 mg | Freq: Once | RECTAL | Status: AC
  Administered 2011-05-28: 10 mg via RECTAL
  Filled 2011-05-28: qty 1

## 2011-05-28 MED ORDER — DOCUSATE SODIUM 50 MG/5ML PO LIQD
100.0000 mg | Freq: Every day | ORAL | Status: DC
  Administered 2011-05-31 – 2011-06-08 (×6): 100 mg via ORAL
  Filled 2011-05-28 (×11): qty 10

## 2011-05-28 MED ORDER — FUROSEMIDE 10 MG/ML IJ SOLN
80.0000 mg | Freq: Three times a day (TID) | INTRAMUSCULAR | Status: DC
  Administered 2011-05-28 – 2011-06-01 (×12): 80 mg via INTRAVENOUS
  Filled 2011-05-28 (×17): qty 8

## 2011-05-28 NOTE — Consult Note (Signed)
Reason for Consult: Acute Renal Failure  Referring Physician: J.D. Kellie Simmering, MD  Norma [name] is a 65 y.o. female  HPI: She was  admitted on 05/25/2011 for scheduled aorta bifemoral bypass graft endarterectomy mesenteric celiac/renal. 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 Trula Slade which revealed that her right common iliac is now totally occluded also. 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 This does not involve flow to the celiac axis or superior mesenteric artery but is infringing on the flow to both renal arteries. She had a CT angiogram performed in October and the plaque in the supra-renal aorta had progressed since May of 2011 when she had her angiogram performed. Claudication symptoms are quite severe limiting her walking one quarter of a block. Surgery lasted for approx 7 hours-required cross clamping of aorta for approx 1 hour, she had transaortic endarterectomy of suprarenal aorta and bilateral renal artery orifices with insertion of aorto bifemoral bypass using 14 x 8 mm Hemashield Dacron graft. She has had progressive decrease in urine output since surgery and rising serum creatinine. We are asked to see due to renal failure. Serum creatinine was 1.0 preoperatively.  SHe denies painful feet post op and there are no reports of gross hematuria.  Past Medical History  Diagnosis Date  . Claudication   . Diabetes mellitus   . Hypertension   . Hyperlipidemia   . DJD (degenerative joint disease)   . PAD (peripheral artery disease)   . Coronary artery disease   . Leg pain   . Carotid artery occlusion   . Obesity   . DDD (degenerative disc disease)   . DJD (degenerative joint disease)   . Shortness of breath     exertion  . Anxiety     Past Surgical History  Procedure Date  . Cardiac catheterization 12/01/2009    EF 65%  . Removal of fibrous cyst from  right breast 10+ YEARS  . Retinal detachment surgery 10+ YEARS    LEFT EYE  . Coronary artery bypass graft 12/08/2009    LIMA GRAFT TO THE DISTAL LAD AND SAPHENOUS VEIN GRAFT TO THE OBTUSE MARGINAL VESSEL  . Transthoracic echocardiogram 12/01/2009    EF 60-65%  . Cardiovascular stress test 11/28/2009    EF 75%    Family History  Problem Relation Age of Onset  . Hypertension Mother   . Coronary artery disease Father   . Hypertension Sister   . Cirrhosis Brother   . Kidney disease Daughter     Social History:  reports that she quit smoking about 19 years ago. Her smoking use included Cigarettes. She quit after 20 years of use. She has never used smokeless tobacco. She reports that she does not drink alcohol or use illicit drugs.  Allergies:  Allergies  Allergen Reactions  . Iohexol      Code: RASH, Desc: pt called 1 day post scanning stating that skin was red and "itching all over" some what better but still had symptom.. instructed pt to take benadryl to relieve symptoms,per dr Alvester Chou.if any problems call back/mms, Onset Date: [id number]   . Sulfa Antibiotics     Medications: I have reviewed the patient's current medications.  Results for orders placed during the hospital encounter of 05/25/11 (from the past 48 hour(s))  BASIC METABOLIC PANEL     Status: Abnormal   Collection Time   05/26/11  2:00 PM      Component Value Range Comment   Sodium 141  135 - 145 (mEq/L)    Potassium 4.3  3.5 - 5.1 (mEq/L)    Chloride 111  96 - 112 (mEq/L)    CO2 19  19 - 32 (mEq/L)    Glucose, Bld 141 (*) 70 - 99 (mg/dL)    BUN 30 (*) 6 - 23 (mg/dL)    Creatinine, Ser 2.39 (*) 0.50 - 1.10 (mg/dL)    Calcium 7.5 (*) 8.4 - 10.5 (mg/dL)    GFR calc non Af Amer 20 (*) >90 (mL/min)    GFR calc Af Amer 23 (*) >90 (mL/min)   GLUCOSE, CAPILLARY     Status: Abnormal   Collection Time   05/26/11  4:29 PM      Component Value Range Comment   Glucose-Capillary 120 (*) 70 - 99 (mg/dL)    Comment 1  Documented in Chart      Comment 2 Notify RN     GLUCOSE, CAPILLARY     Status: Abnormal   Collection Time   05/26/11  8:05 PM      Component Value Range Comment   Glucose-Capillary 137 (*) 70 - 99 (mg/dL)   GLUCOSE, CAPILLARY     Status: Abnormal   Collection Time   05/27/11 12:03 AM      Component Value Range Comment   Glucose-Capillary 168 (*) 70 - 99 (mg/dL)   GLUCOSE, CAPILLARY     Status: Abnormal   Collection Time   05/27/11  4:01 AM      Component Value Range Comment   Glucose-Capillary 185 (*) 70 - 99 (mg/dL)   POCT I-STAT 3, BLOOD GAS (G3+)     Status: Abnormal   Collection Time   05/27/11  4:46 AM      Component Value Range Comment   pH, Arterial 7.382  7.350 - 7.400     pCO2 arterial 30.7 (*) 35.0 - 45.0 (mmHg)    pO2, Arterial 68.0 (*) 80.0 - 100.0 (mmHg)    Bicarbonate 18.4 (*) 20.0 - 24.0 (mEq/L)    TCO2 19  0 - 100 (mmol/L)    O2 Saturation 94.0      Acid-base deficit 6.0 (*) 0.0 - 2.0 (mmol/L)    Patient temperature 97.5 F      Collection site ARTERIAL LINE      Sample type ARTERIAL     CBC     Status: Abnormal   Collection Time   05/27/11  4:55 AM      Component Value Range Comment   WBC 12.9 (*) 4.0 - 10.5 (K/uL)    RBC 4.35  3.87 - 5.11 (MIL/uL)    Hemoglobin 11.7 (*) 12.0 - 15.0 (g/dL)    HCT 36.8  36.0 - 46.0 (%)    MCV 84.6  78.0 - 100.0 (fL)    MCH 26.9  26.0 - 34.0 (pg)    MCHC 31.8  30.0 - 36.0 (g/dL)    RDW 17.9 (*) 11.5 - 15.5 (%)    Platelets 156  150 - 400 (K/uL)   COMPREHENSIVE METABOLIC PANEL     Status: Abnormal   Collection Time   05/27/11  4:55 AM      Component Value Range Comment   Sodium 141  135 - 145 (mEq/L)    Potassium 4.4  3.5 - 5.1 (mEq/L)    Chloride 110  96 - 112 (mEq/L)    CO2 19  19 -  32 (mEq/L)    Glucose, Bld 185 (*) 70 - 99 (mg/dL)    BUN 34 (*) 6 - 23 (mg/dL)    Creatinine, Ser 2.75 (*) 0.50 - 1.10 (mg/dL)    Calcium 7.8 (*) 8.4 - 10.5 (mg/dL)    Total Protein 4.9 (*) 6.0 - 8.3 (g/dL)    Albumin 1.9 (*) 3.5 -  5.2 (g/dL)    AST 45 (*) 0 - 37 (U/L)    ALT 39 (*) 0 - 35 (U/L)    Alkaline Phosphatase 77  39 - 117 (U/L)    Total Bilirubin 1.7 (*) 0.3 - 1.2 (mg/dL)    GFR calc non Af Amer 17 (*) >90 (mL/min)    GFR calc Af Amer 20 (*) >90 (mL/min)   DIFFERENTIAL     Status: Abnormal   Collection Time   05/27/11  4:55 AM      Component Value Range Comment   Neutrophils Relative 83 (*) 43 - 77 (%)    Neutro Abs 10.6 (*) 1.7 - 7.7 (K/uL)    Lymphocytes Relative 9 (*) 12 - 46 (%)    Lymphs Abs 1.2  0.7 - 4.0 (K/uL)    Monocytes Relative 8  3 - 12 (%)    Monocytes Absolute 1.0  0.1 - 1.0 (K/uL)    Eosinophils Relative 0  0 - 5 (%)    Eosinophils Absolute 0.0  0.0 - 0.7 (K/uL)    Basophils Relative 0  0 - 1 (%)    Basophils Absolute 0.0  0.0 - 0.1 (K/uL)   PROTIME-INR     Status: Abnormal   Collection Time   05/27/11  4:55 AM      Component Value Range Comment   Prothrombin Time 15.5 (*) 11.6 - 15.2 (seconds)    INR 1.20  0.00 - 1.49    APTT     Status: Normal   Collection Time   05/27/11  4:55 AM      Component Value Range Comment   aPTT 32  24 - 37 (seconds)   CORTISOL     Status: Normal   Collection Time   05/27/11  8:00 AM      Component Value Range Comment   Cortisol, Plasma 26.8     GLUCOSE, CAPILLARY     Status: Abnormal   Collection Time   05/27/11  8:18 AM      Component Value Range Comment   Glucose-Capillary 146 (*) 70 - 99 (mg/dL)    Comment 1 Documented in Chart      Comment 2 Notify RN     GLUCOSE, CAPILLARY     Status: Abnormal   Collection Time   05/27/11 12:07 PM      Component Value Range Comment   Glucose-Capillary 154 (*) 70 - 99 (mg/dL)    Comment 1 Documented in Chart      Comment 2 Notify RN     GLUCOSE, CAPILLARY     Status: Abnormal   Collection Time   05/27/11  4:59 PM      Component Value Range Comment   Glucose-Capillary 132 (*) 70 - 99 (mg/dL)    Comment 1 Documented in Chart      Comment 2 Notify RN     GLUCOSE, CAPILLARY     Status: Abnormal    Collection Time   05/27/11  5:14 PM      Component Value Range Comment   Glucose-Capillary 131 (*) 70 - 99 (mg/dL)   BASIC  METABOLIC PANEL     Status: Abnormal   Collection Time   05/27/11  5:19 PM      Component Value Range Comment   Sodium 141  135 - 145 (mEq/L)    Potassium 4.3  3.5 - 5.1 (mEq/L)    Chloride 109  96 - 112 (mEq/L)    CO2 19  19 - 32 (mEq/L)    Glucose, Bld 135 (*) 70 - 99 (mg/dL)    BUN 40 (*) 6 - 23 (mg/dL)    Creatinine, Ser 3.21 (*) 0.50 - 1.10 (mg/dL)    Calcium 8.1 (*) 8.4 - 10.5 (mg/dL)    GFR calc non Af Amer 14 (*) >90 (mL/min)    GFR calc Af Amer 16 (*) >90 (mL/min)   GLUCOSE, CAPILLARY     Status: Abnormal   Collection Time   05/27/11  8:08 PM      Component Value Range Comment   Glucose-Capillary 124 (*) 70 - 99 (mg/dL)   GLUCOSE, CAPILLARY     Status: Abnormal   Collection Time   05/28/11 12:16 AM      Component Value Range Comment   Glucose-Capillary 122 (*) 70 - 99 (mg/dL)   COMPREHENSIVE METABOLIC PANEL     Status: Abnormal   Collection Time   05/28/11  3:49 AM      Component Value Range Comment   Sodium 142  135 - 145 (mEq/L)    Potassium 4.4  3.5 - 5.1 (mEq/L)    Chloride 109  96 - 112 (mEq/L)    CO2 20  19 - 32 (mEq/L)    Glucose, Bld 144 (*) 70 - 99 (mg/dL)    BUN 45 (*) 6 - 23 (mg/dL)    Creatinine, Ser 3.57 (*) 0.50 - 1.10 (mg/dL)    Calcium 8.5  8.4 - 10.5 (mg/dL)    Total Protein 5.2 (*) 6.0 - 8.3 (g/dL)    Albumin 2.0 (*) 3.5 - 5.2 (g/dL)    AST 78 (*) 0 - 37 (U/L)    ALT 49 (*) 0 - 35 (U/L)    Alkaline Phosphatase 91  39 - 117 (U/L)    Total Bilirubin 1.6 (*) 0.3 - 1.2 (mg/dL)    GFR calc non Af Amer 12 (*) >90 (mL/min)    GFR calc Af Amer 14 (*) >90 (mL/min)   CBC     Status: Abnormal   Collection Time   05/28/11  3:49 AM      Component Value Range Comment   WBC 13.7 (*) 4.0 - 10.5 (K/uL)    RBC 3.88  3.87 - 5.11 (MIL/uL)    Hemoglobin 10.6 (*) 12.0 - 15.0 (g/dL)    HCT 32.9 (*) 36.0 - 46.0 (%)    MCV 84.8  78.0 -  100.0 (fL)    MCH 27.3  26.0 - 34.0 (pg)    MCHC 32.2  30.0 - 36.0 (g/dL)    RDW 17.9 (*) 11.5 - 15.5 (%)    Platelets 137 (*) 150 - 400 (K/uL)   DIFFERENTIAL     Status: Abnormal   Collection Time   05/28/11  3:49 AM      Component Value Range Comment   Neutrophils Relative 82 (*) 43 - 77 (%)    Neutro Abs 11.2 (*) 1.7 - 7.7 (K/uL)    Lymphocytes Relative 9 (*) 12 - 46 (%)    Lymphs Abs 1.2  0.7 - 4.0 (K/uL)    Monocytes Relative 8  3 -  12 (%)    Monocytes Absolute 1.2 (*) 0.1 - 1.0 (K/uL)    Eosinophils Relative 1  0 - 5 (%)    Eosinophils Absolute 0.1  0.0 - 0.7 (K/uL)    Basophils Relative 0  0 - 1 (%)    Basophils Absolute 0.0  0.0 - 0.1 (K/uL)   PROTIME-INR     Status: Abnormal   Collection Time   05/28/11  3:49 AM      Component Value Range Comment   Prothrombin Time 15.5 (*) 11.6 - 15.2 (seconds)    INR 1.20  0.00 - 1.49    APTT     Status: Normal   Collection Time   05/28/11  3:49 AM      Component Value Range Comment   aPTT 33  24 - 37 (seconds)   POCT I-STAT 3, BLOOD GAS (G3+)     Status: Abnormal   Collection Time   05/28/11  4:05 AM      Component Value Range Comment   pH, Arterial 7.364  7.350 - 7.400     pCO2 arterial 32.0 (*) 35.0 - 45.0 (mmHg)    pO2, Arterial 74.0 (*) 80.0 - 100.0 (mmHg)    Bicarbonate 18.3 (*) 20.0 - 24.0 (mEq/L)    TCO2 19  0 - 100 (mmol/L)    O2 Saturation 94.0      Acid-base deficit 6.0 (*) 0.0 - 2.0 (mmol/L)    Patient temperature 98.5 F      Collection site ARTERIAL LINE      Drawn by Operator      Sample type ARTERIAL     GLUCOSE, CAPILLARY     Status: Abnormal   Collection Time   05/28/11  4:14 AM      Component Value Range Comment   Glucose-Capillary 136 (*) 70 - 99 (mg/dL)   GLUCOSE, CAPILLARY     Status: Abnormal   Collection Time   05/28/11  7:39 AM      Component Value Range Comment   Glucose-Capillary 137 (*) 70 - 99 (mg/dL)    Comment 1 Notify RN      Comment 2 Documented in Chart     GLUCOSE, CAPILLARY      Status: Abnormal   Collection Time   05/28/11 11:43 AM      Component Value Range Comment   Glucose-Capillary 146 (*) 70 - 99 (mg/dL)    Comment 1 Documented in Chart      Comment 2 Notify RN       Dg Chest Port 1 View  05/28/2011  *RADIOLOGY REPORT*  Clinical Data: Patient removed and tracheal tube yesterday.  PORTABLE CHEST - 1 VIEW  Comparison: 05/27/2011.  Findings: Interval extubation with removal of the right IJ Swan- Ganz catheter and nasogastric tube as well. The right IJ catheter sheath remains in place.  Heart size stable.  Sternal wires are unchanged in position.  Lungs are low in volume with bibasilar air space disease and probable bilateral pleural effusions.  Aeration may have improved slightly in the interval.  IMPRESSION: Low lung volumes with bibasilar air space disease and small bilateral pleural effusions.  Overall aeration may improve slightly in the interval.  Original Report Authenticated By: Luretha Rued, M.D.   Dg Chest Port 1 View  05/27/2011  *RADIOLOGY REPORT*  Clinical Data: Intubated  PORTABLE CHEST - 1 VIEW  Comparison:   the previous day's study  Findings: Endotracheal tube, nasogastric tube, right IJ Swan-Ganz are stable  in position.  Previous CABG.  Stable cardiomegaly. Slight improvement in the perihilar edema or infiltrates. Persistent bibasilar consolidation / atelectasis and probable layering effusions.  IMPRESSION:  1.  Slight interval improvement bilateral edema/infiltrates. 2.  Persistent layering pleural effusions. 3. Support hardware stable in position.  Original Report Authenticated By: Trecia Rogers, M.D.    Review of Systems  Constitutional: Negative.   HENT: Negative.   Eyes: Negative.   Respiratory: Negative.   Cardiovascular: Negative.   Gastrointestinal: Heartburn: had frequent bm preop after a laxative o/w neg.       Had frequent bm preop due to laxative  Genitourinary: Negative.   Musculoskeletal: Negative.        Occasional  edema  Skin: Negative.   Neurological: Positive for sensory change.       Occasional parestheisas  Psychiatric/Behavioral: Negative.    Blood pressure 155/46, pulse 59, temperature 98.1 F (36.7 C), temperature source Oral, resp. rate 25, height 5\' 2"  (1.575 m), weight 110.8 kg (244 lb 4.3 oz), SpO2 95.00%. Physical Exam  Vitals reviewed. Constitutional: She appears well-developed.       Obese female  HENT:  Head: Normocephalic and atraumatic.  Eyes: Conjunctivae and EOM are normal. Pupils are equal, round, and reactive to light.  Neck: Normal range of motion. No tracheal deviation present. No thyromegaly present.  Cardiovascular: Normal rate and normal heart sounds.  Exam reveals no gallop and no friction rub.   Respiratory: Effort normal. No stridor. No respiratory distress. She has no wheezes. She has no rales. She exhibits no tenderness.  GI: Soft. She exhibits no distension and no mass. There is no tenderness. There is no rebound and no guarding.  Genitourinary:       Not examined  Musculoskeletal: Normal range of motion.       Toes without evidence of atheroemboli  Lymphadenopathy:    She has no cervical adenopathy.  Neurological: She is alert. She displays normal reflexes. No cranial nerve deficit. She exhibits normal muscle tone. Coordination normal.  Skin: Skin is warm. No rash noted. No erythema. No pallor.  Psychiatric: Her behavior is normal.    Assessment/Plan:  Acute Kidney Injury, Nonoliguric  Hemodynamically medicated secondary to clamping aorta, rule out atheroembolic Volume Overload Severe Peripheral Vascular Disease Diabetes Mellitus Coronary Artery Disease Hypertension Dyslipidemia  Plan: Diurese, support hemodynamically, check u/a, C3,C4 & will follow    Gram Siedlecki C 05/28/2011, 1:26 PM

## 2011-05-28 NOTE — Progress Notes (Signed)
Physical Therapy Evaluation Patient Details Name: Norma [name] MRN: [id number] DOB: 02-02-1946 Today's Date: 05/28/2011  Problem List:  Patient Active Problem List  Diagnoses  . PAD (peripheral artery disease)  . Coronary artery disease  . Hyperlipidemia  . Hypertension  . Diabetes mellitus type 2, insulin dependent  . Respiratory failure, acute  . S/P aorto-bifemoral bypass surgery  . In an in and  . Acute kidney injury    Past Medical History:  Past Medical History  Diagnosis Date  . Claudication   . Diabetes mellitus   . Hypertension   . Hyperlipidemia   . DJD (degenerative joint disease)   . PAD (peripheral artery disease)   . Coronary artery disease   . Leg pain   . Carotid artery occlusion   . Obesity   . DDD (degenerative disc disease)   . DJD (degenerative joint disease)   . Shortness of breath     exertion  . Anxiety    Past Surgical History:  Past Surgical History  Procedure Date  . Cardiac catheterization 12/01/2009    EF 65%  . Removal of fibrous cyst from right breast 10+ YEARS  . Retinal detachment surgery 10+ YEARS    LEFT EYE  . Coronary artery bypass graft 12/08/2009    LIMA GRAFT TO THE DISTAL LAD AND SAPHENOUS VEIN GRAFT TO THE OBTUSE MARGINAL VESSEL  . Transthoracic echocardiogram 12/01/2009    EF 60-65%  . Cardiovascular stress test 11/28/2009    EF 75%    PT Assessment/Plan/Recommendation PT Assessment Clinical Impression Statement: Patient will benefit from PT to address balance, endurance, and strength.  Will most likely need NHP with therapy prior to D/C home. PT Recommendation/Assessment: Patient will need skilled PT in the acute care venue PT Problem List: Decreased strength;Decreased activity tolerance;Decreased mobility;Decreased balance;Decreased knowledge of use of DME;Decreased safety awareness;Decreased knowledge of precautions;Cardiopulmonary status limiting activity;Pain PT Therapy Diagnosis : Difficulty walking;Acute pain PT  Plan PT Frequency: Min 3X/week PT Treatment/Interventions: DME instruction;Gait training;Functional mobility training;Therapeutic exercise;Balance training;Patient/family education PT Recommendation Follow Up Recommendations: Skilled nursing facility;24 hour supervision/assistance Equipment Recommended: Defer to next venue PT Goals  Acute Rehab PT Goals PT Goal Formulation: With patient Time For Goal Achievement: 2 weeks Pt will go Supine/Side to Sit: with supervision;with HOB 0 degrees PT Goal: Supine/Side to Sit - Progress: Other (comment) Pt will Transfer Sit to Stand/Stand to Sit: with min assist;with upper extremity assist PT Transfer Goal: Sit to Stand/Stand to Sit - Progress: Other (comment) Pt will Ambulate: 16 - 50 feet;with min assist;with least restrictive assistive device PT Goal: Ambulate - Progress: Other (comment) Pt will Perform Home Exercise Program: with supervision, verbal cues required/provided PT Goal: Perform Home Exercise Program - Progress: Other (comment)  PT Evaluation Precautions/Restrictions  Precautions Precautions: Fall Required Braces or Orthoses: No Restrictions Weight Bearing Restrictions: No Prior Functioning  Home Living Lives With: Daughter Receives Help From: Family Type of Home: Apartment Home Layout: One level Home Access: Level entry Bathroom Shower/Tub: Tub/shower unit;Curtain Bathroom Toilet: Standard Bathroom Accessibility: Yes How Accessible: Accessible via walker Home Adaptive Equipment: Walker - rolling;Shower chair with back Prior Function Level of Independence: Independent with basic ADLs;Independent with homemaking with ambulation;Independent with gait;Independent with transfers Able to Take Stairs?: Yes Driving: No Vocation: Retired Artist: Awake/alert Overall Cognitive Status: Appears within functional limits for tasks assessed Orientation Level: Oriented  X4 Sensation/Coordination Sensation Light Touch: Appears Intact Stereognosis: Not tested Hot/Cold: Not tested Proprioception: Not tested Coordination Gross Motor Movements are  Fluid and Coordinated: Yes Fine Motor Movements are Fluid and Coordinated: Yes Extremity Assessment RUE Assessment RUE Assessment: Not tested (co rx with OT) LUE Assessment LUE Assessment: Not tested RLE Assessment RLE Assessment: Exceptions to [facility] RLE Strength RLE Overall Strength: Within Functional Limits for tasks assessed;Due to pain LLE Assessment LLE Assessment: Exceptions to [facility] LLE Strength LLE Overall Strength: Within Functional Limits for tasks assessed;Due to pain Mobility (including Balance) Bed Mobility Bed Mobility: Yes Rolling Left: 1: +2 Total assist;Patient percentage (comment) (pt = 40%) Rolling Left Details (indicate cue type and reason): Pt. needed cues to sequence movement and limited by pain Left Sidelying to Sit: 1: +2 Total assist;Patient percentage (comment);With rails;HOB elevated (comment degrees) (pt = 40%, HOB at 20 degrees, cues to sequence) Supine to Sit: 1: +2 Total assist;With rails (pt=50%) Supine to Sit Details (indicate cue type and reason): Max verbal cues for hand placement  Transfers Transfers: Yes Sit to Stand: 1: +2 Total assist;Patient percentage (comment);From elevated surface;With upper extremity assist;From bed (pt = 50%) Sit to Stand Details (indicate cue type and reason): Pt. had difficulty initiating movement.  Pt. did not extend knees fully keeping them slightly bent thus almost appearing to buckle.   Stand to Sit: 1: +2 Total assist;Patient percentage (comment);With upper extremity assist;With armrests;To chair/3-in-1 (pt = 50 %) Stand to Sit Details: Uncontrolled descent into chair Stand Pivot Transfers: With armrests Stand Pivot Transfer Details (indicate cue type and reason): Pt. had handheld assist of 2 persons and was able to move feet a little for  pivot transfer to chair. Ambulation/Gait Ambulation/Gait: No Stairs: No Wheelchair Mobility Wheelchair Mobility: No  Posture/Postural Control Posture/Postural Control: Postural limitations Postural Limitations: Forwardly flexed posture Balance Balance Assessed: No Exercise    End of Session PT - End of Session Equipment Utilized During Treatment: Gait belt Activity Tolerance: Patient limited by pain (Patient also self limiting) Patient left: in chair;with call bell in reach Nurse Communication: Mobility status for transfers General Behavior During Session: [facility] for tasks performed Cognition: [facility] for tasks performed  INGOLD,Zanyla Klebba 05/28/2011, 3:21 PM [facility] [phone] (pager)

## 2011-05-28 NOTE — Progress Notes (Signed)
Occupational Therapy Evaluation Patient Details Name: Norma [name] MRN: [id number] DOB: 31-Mar-1946 Today's Date: 05/28/2011  Problem List:  Patient Active Problem List  Diagnoses  . PAD (peripheral artery disease)  . Coronary artery disease  . Hyperlipidemia  . Hypertension  . Diabetes mellitus type 2, insulin dependent  . Respiratory failure, acute  . S/P aorto-bifemoral bypass surgery  . In an in and  . Acute kidney injury    Past Medical History:  Past Medical History  Diagnosis Date  . Claudication   . Diabetes mellitus   . Hypertension   . Hyperlipidemia   . DJD (degenerative joint disease)   . PAD (peripheral artery disease)   . Coronary artery disease   . Leg pain   . Carotid artery occlusion   . Obesity   . DDD (degenerative disc disease)   . DJD (degenerative joint disease)   . Shortness of breath     exertion  . Anxiety    Past Surgical History:  Past Surgical History  Procedure Date  . Cardiac catheterization 12/01/2009    EF 65%  . Removal of fibrous cyst from right breast 10+ YEARS  . Retinal detachment surgery 10+ YEARS    LEFT EYE  . Coronary artery bypass graft 12/08/2009    LIMA GRAFT TO THE DISTAL LAD AND SAPHENOUS VEIN GRAFT TO THE OBTUSE MARGINAL VESSEL  . Transthoracic echocardiogram 12/01/2009    EF 60-65%  . Cardiovascular stress test 11/28/2009    EF 75%    OT Assessment/Plan/Recommendation OT Assessment Clinical Impression Statement: Pt. will benefit from OT to increase functional independence with ADLs and decrease burden of care at D/C by getting pt. to min assist-supervision level. OT Recommendation/Assessment: Patient will need skilled OT in the acute care venue OT Problem List: Decreased strength;Decreased activity tolerance;Impaired balance (sitting and/or standing);Decreased safety awareness;Decreased knowledge of use of DME or AE Barriers to Discharge: None OT Therapy Diagnosis : Generalized weakness;Acute pain OT Plan OT  Frequency: Min 1X/week OT Treatment/Interventions: Self-care/ADL training;DME and/or AE instruction;Energy conservation;Therapeutic activities;Balance training;Patient/family education OT Recommendation Follow Up Recommendations: Skilled nursing facility Equipment Recommended: Defer to next venue Individuals Consulted Consulted and Agree with Results and Recommendations: Patient OT Goals Acute Rehab OT Goals OT Goal Formulation: With patient Time For Goal Achievement: 2 weeks ADL Goals Pt Will Perform Grooming: with set-up;Standing at sink ADL Goal: Grooming - Progress: Progressing toward goals Pt Will Perform Lower Body Bathing: with min assist;Sit to stand from bed ADL Goal: Lower Body Bathing - Progress: Progressing toward goals Pt Will Perform Lower Body Dressing: with min assist;with adaptive equipment;Sit to stand from bed ADL Goal: Lower Body Dressing - Progress: Progressing toward goals Pt Will Transfer to Toilet: with min assist;3-in-1;Stand pivot transfer ADL Goal: Toilet Transfer - Progress: Progressing toward goals Pt Will Perform Toileting - Hygiene: with set-up;Sitting on 3-in-1 or toilet ADL Goal: Toileting - Hygiene - Progress: Progressing toward goals  OT Evaluation Precautions/Restrictions  Precautions Precautions: Fall Required Braces or Orthoses: No Restrictions Weight Bearing Restrictions: No Prior Functioning Home Living Lives With: Daughter Receives Help From: Family Type of Home: Apartment Home Layout: One level Home Access: Level entry Bathroom Shower/Tub: [place] unit;Curtain Bathroom Toilet: Standard Bathroom Accessibility: Yes How Accessible: Accessible via walker Home Adaptive Equipment: Walker - rolling;Shower chair with back Prior Function Level of Independence: Independent with basic ADLs;Independent with homemaking with ambulation;Independent with gait;Independent with transfers Able to Take Stairs?: Yes Driving: No Vocation:  Retired ADL ADL Eating/Feeding: Simulated;Set up Where Assessed -  Eating/Feeding: Chair Grooming: Performed;Wash/dry face;Set up Grooming Details (indicate cue type and reason): Pt. completed with use of Rt UE Where Assessed - Grooming: Sitting, chair Upper Body Bathing: Simulated;Chest;Right arm;Left arm;Abdomen;Minimal assistance Where Assessed - Upper Body Bathing: Sitting, bed Lower Body Bathing: Simulated;Moderate assistance Where Assessed - Lower Body Bathing: Sit to stand from bed Upper Body Dressing: Performed;Set up Upper Body Dressing Details (indicate cue type and reason): With donning gown Where Assessed - Upper Body Dressing: Sitting, bed Lower Body Dressing: Performed;+1 Total assistance Lower Body Dressing Details (indicate cue type and reason): With donning bilateral socks due to pt. unable to bend forward with incision site. Where Assessed - Lower Body Dressing: Sit to stand from bed Toilet Transfer: Performed;+2 Total assistance;Comment for patient % (pt=60%) Toilet Transfer Details (indicate cue type and reason): Max verbal cues for hand placement on arm rests and for technique. Toilet Transfer Method: Stand pivot Toileting - Clothing Manipulation: Simulated;Maximal assistance Toileting - Clothing Manipulation Details (indicate cue type and reason): With gown management Where Assessed - Toileting Clothing Manipulation: Not assessed Toileting - Hygiene: Simulated;Maximal assistance Toileting - Hygiene Details (indicate cue type and reason): Pt. unable to twist to reach backside due to incisional site at abdomen Where Assessed - Toileting Hygiene: Sit to stand from 3-in-1 or toilet Tub/Shower Transfer: Not assessed Tub/Shower Transfer Method: Not assessed ADL Comments: Pt. completed bed-chair with total assist  Vision/Perception  Vision - History Baseline Vision: Other (comment) (Left eye legally blind) Patient Visual Report: No change from baseline Vision -  Assessment Eye Alignment: Impaired (comment) (Left eye extropic) Vision Assessment: Vision not tested Cognition Cognition Arousal/Alertness: Awake/alert Overall Cognitive Status: Appears within functional limits for tasks assessed Orientation Level: Oriented X4 Sensation/Coordination Sensation Light Touch: Appears Intact Stereognosis: Not tested Hot/Cold: Not tested Proprioception: Not tested Coordination Gross Motor Movements are Fluid and Coordinated: Yes Fine Motor Movements are Fluid and Coordinated: Yes Extremity Assessment RUE Assessment RUE Assessment: Not tested (co rx with OT) LUE Assessment LUE Assessment: Within Functional Limits Mobility  Bed Mobility Bed Mobility: Yes Rolling Left: 1: +2 Total assist;Patient percentage (comment) (pt = 40%) Rolling Left Details (indicate cue type and reason): Pt. needed cues to sequence movement and limited by pain Left Sidelying to Sit: 1: +2 Total assist;Patient percentage (comment);With rails;HOB elevated (comment degrees) (pt = 40%, HOB at 20 degrees, cues to sequence) Supine to Sit: 1: +2 Total assist;With rails (pt=50%) Supine to Sit Details (indicate cue type and reason): Max verbal cues for hand placement  Transfers Transfers: Yes Sit to Stand: 1: +2 Total assist;Patient percentage (comment);From elevated surface;With upper extremity assist;From bed (pt = 50%) Sit to Stand Details (indicate cue type and reason): Pt. had difficulty initiating movement.  Pt. did not extend knees fully keeping them slightly bent thus almost appearing to buckle.   Stand to Sit: 1: +2 Total assist;Patient percentage (comment);With upper extremity assist;With armrests;To chair/3-in-1 (pt = 50 %) Stand to Sit Details: Uncontrolled descent into chair   End of Session OT - End of Session Equipment Utilized During Treatment: Gait belt Activity Tolerance: Patient tolerated treatment well Patient left: in chair Nurse Communication: Mobility status  for transfers General Behavior During Session: Flat affect Cognition: [facility] for tasks performed   Coren Sagan, OTR/L Pager: [phone] 05/28/2011, 3:16 PM

## 2011-05-28 NOTE — Plan of Care (Signed)
Problem: Phase II Progression Outcomes Goal: Progress activity as tolerated unless otherwise ordered Outcome: Progressing PT evaluation completed.  PT to follow.  Recommend short term NHP with therapy to increase mobility prior to D/C home. [facility] [phone] (pager)

## 2011-05-28 NOTE — Progress Notes (Addendum)
*  PRELIMINARY RESULTS*  Post OP ABIs have been performed. Right Brachial pressure is 161 and the left is 168. Right ABI is 0.85 and Left ABI is 0.82.  Landry Mellow 05/28/2011, 12:22 PM

## 2011-05-28 NOTE — Progress Notes (Addendum)
Patient ID: Norma [name], female   DOB: [birth year], 65 y.o.   MRN: [id number] Norma [name] is a 65 y.o. female admitted on 05/25/2011 for scheduled aorta bifemoral bypass graft endarterectomy mesenteric celiac/renal. ARF s/p cross clamping of renals with rising Cr. PCCM asked to help with ICU care.  LINES / TUBES  11/16 OETT>>> 11/17 11/16 R IJ Swan + introducor>>> 11/17 PAC out>>>  CULTURES  None  ABX  Peri op cefuroxime 11/16>>>11/17  [facility]ST PRACTICE GI: Protonix  DVT: Hep sq Nutrition: NPO  PROTOCOLS / CONSULTANTS  11/16 Intermittent Sedation>>> 11/17 11/16 ICU SSI>>>   KEY EVENTS / STUDIES  PHYICAL EXAM:  Filed Vitals:   05/28/11 1600  BP:   Pulse: 61  Temp:   Resp: 22      General: chronically ill  Awake and alert. Neuro: no focal deficits CV: s1s2 regular, bradycardic resolved PULM: clear GI: obese/soft, bsx4 hypoactive, midline abd incision c/d/i  Extremities: Warm/dry, generalized anasarca    Lab data:    Component Value Date/Time   WBC 13.7* 05/28/2011 0349   RBC 3.88 05/28/2011 0349   HGB 10.6* 05/28/2011 0349   HCT 32.9* 05/28/2011 0349   PLT 137* 05/28/2011 0349   MCV 84.8 05/28/2011 0349   MCH 27.3 05/28/2011 0349   MCHC 32.2 05/28/2011 0349   RDW 17.9* 05/28/2011 0349   LYMPHSABS 1.2 05/28/2011 0349   MONOABS 1.2* 05/28/2011 0349   EOSABS 0.1 05/28/2011 0349   BASOSABS 0.0 05/28/2011 0349   BMET    Component Value Date/Time   NA 142 05/28/2011 0349   K 4.4 05/28/2011 0349   CL 109 05/28/2011 0349   CO2 20 05/28/2011 0349   GLUCOSE 144* 05/28/2011 0349   BUN 45* 05/28/2011 0349   CREATININE 3.57* 05/28/2011 0349   CALCIUM 8.5 05/28/2011 0349   GFRNONAA 12* 05/28/2011 0349   GFRAA 14* 05/28/2011 0349      RADIOLOGIC DATA  Dg Chest Port 1 View  05/28/2011  *RADIOLOGY REPORT*  Clinical Data: Patient removed and tracheal tube yesterday.  PORTABLE CHEST - 1 VIEW  Comparison: 05/27/2011.  Findings: Interval extubation with removal  of the right IJ Swan- Ganz catheter and nasogastric tube as well. The right IJ catheter sheath remains in place.  Heart size stable.  Sternal wires are unchanged in position.  Lungs are low in volume with bibasilar air space disease and probable bilateral pleural effusions.  Aeration may have improved slightly in the interval.  IMPRESSION: Low lung volumes with bibasilar air space disease and small bilateral pleural effusions.  Overall aeration may improve slightly in the interval.  Original Report Authenticated By: Luretha Rued, M.D.   Dg Chest Port 1 View  05/27/2011  *RADIOLOGY REPORT*  Clinical Data: Intubated  PORTABLE CHEST - 1 VIEW  Comparison:   the previous day's study  Findings: Endotracheal tube, nasogastric tube, right IJ Swan-Ganz are stable in position.  Previous CABG.  Stable cardiomegaly. Slight improvement in the perihilar edema or infiltrates. Persistent bibasilar consolidation / atelectasis and probable layering effusions.  IMPRESSION:  1.  Slight interval improvement bilateral edema/infiltrates. 2.  Persistent layering pleural effusions. 3. Support hardware stable in position.  Original Report Authenticated By: Trecia Rogers, M.D.    ASSESSMENT/PLAN:  Principal Problem:  *S/P aorto-bifemoral bypass surgery  Active Problems:  PAD (peripheral artery disease)  Respiratory failure, acute   1. Post-op Respiratory failure s/p aorto-bifem bypass. Prolonged surgical time due to extensive vascular disease in setting of morbidly  obese.  PLAN: -ok extubated.  2. S/P aorto-bifem bypass per Dr. Kellie Simmering  PLAN:  -Rec's per Dr. VVS  -dopamine per VVS  3. Oliguria. Required cross clamp of aorta for approx 1 hr, ARF PLAN:  UOP poor, SCr rising, renal consulted.  4. DM / Hyperglycemia  PLAN: SSI  5. Bradycardia, shock Resolved   6. Diet: per VVS  Still with ileus  8. dvtr prev Hep SQ  CC 57min  Sol Passer PCCM Service   Beeper   [phone]  Cell  [phone]

## 2011-05-28 NOTE — Clinical Documentation Improvement (Signed)
SHOCK DOCUMENTATION CLARIFICATION QUERY   Please update your documentation within the medical record to reflect your response to this query.                                                                                         05/28/11     Dear [name]:  In a better effort to capture your patient's severity of illness, reflect appropriate length of stay and utilization of resources, a review of the patient medical record has revealed the following indicators.    Based on your clinical judgment, please clarify and document in a progress note and/or discharge summary the clinical condition associated with the following supporting information:  In responding to this query please exercise your independent judgment.  The fact that a query is asked, does not imply that any particular answer is desired or expected.  Possible Clinical Conditions?  " Septic Shock  " Cardiogenic Shock  " Hypovolemic Shock  " Hemorrhagic Shock  " Hyper / Hypoglycemic Shock  " Other Condition_______________  " Cannot Clinically determine   Signs & Symptoms:  Bradycardia, Shock noted per 11/18 progress note.  Please specify type of shock in the progress notes.   You may use possible, probable, or suspect with inpatient documentation. possible, probable, suspected diagnoses MUST be documented at the time of discharge  Reviewed: Bradycardia/ Shock resolved documented 11/19 with no clarification of type of shock documented to date.  Thank You,  Sincerely, Theron Arista,  Clinical Documentation Specialist:  Pager: [phone]

## 2011-05-28 NOTE — Clinical Documentation Improvement (Signed)
Anemia Blood Loss Clarification  Dear Norma [name]  In an effort to better capture your patient's severity of illness, reflect appropriate length of stay and utilization of resources, a review of the patient medical record has revealed the following indicators.    Based on your clinical judgment, please clarify and document in a progress note and/or discharge summary the clinical condition associated with the following supporting information:  In responding to this query please exercise your independent judgment.  The fact that a query is asked, does not imply that any particular answer is desired or expected.  Possible Clinical Conditions?   " Expected Acute Blood Loss Anemia  " Acute Blood Loss Anemia  " Acute on chronic blood loss anemia  " Other Condition________________  " Cannot Clinically Determine    Supporting Information: Per Op note, 11/16: received 4units PRBC's,  400cc Cell Saver, 2u FFP.  Diagnostics: H&H on 11/16: 9.5/28.0   Reviewed: Acute Blood Loss Anemia was documented in the progress notes on 06/04/11.   Thank You,  Sincerely, Theron Arista,  Clinical Documentation Specialist:  Pager: [phone]

## 2011-05-28 NOTE — Plan of Care (Signed)
Problem: Discharge Progression Outcomes Goal: Activity appropriate for discharge plan Outcome: Progressing OT evaluation completed and recommend ST-SNF prior to D/C home, pending pt's progress HHOT with 24 hour supervision/assist. Will follow acutely. Thanks!

## 2011-05-28 NOTE — Progress Notes (Addendum)
[facility] [place]  Post-op  OPEN AAA Repair  Date of Surgery: 05/25/2011 Surgeon: Surgeon(s): Clayton Bibles Annamarie Major, MD Mal Misty, MD POD: 3 Days Post-Op  History of Present Illness  Norma [name] is a 65 y.o. female who is  Procedure(s): AORTA BIFEMORAL BYPASS GRAFT  Pt self extubated herself last PM and is breathing comfortably Pt is doing well. denies incisional pain; denies nausea/vomiting; denies diarrhea. has not had flatus;has not had BM. U/O adequate at >45 cc/hr on 80mg  lasix BID  Dg Chest [facility] 1 View  05/27/2011  *RADIOLOGY REPORT*  Clinical Data: Intubated  PORTABLE CHEST - 1 VIEW  Comparison:   the previous day's study  Findings: Endotracheal tube, nasogastric tube, right IJ Swan-Ganz are stable in position.  Previous CABG.  Stable cardiomegaly. Slight improvement in the perihilar edema or infiltrates. Persistent bibasilar consolidation / atelectasis and probable layering effusions.  IMPRESSION:  1.  Slight interval improvement bilateral edema/infiltrates. 2.  Persistent layering pleural effusions. 3. Support hardware stable in position.  Original Report Authenticated By: Trecia Rogers, M.D.    Significant Diagnostic Studies: CBC    Component Value Date/Time   WBC 13.7* 05/28/2011 0349   RBC 3.88 05/28/2011 0349   HGB 10.6* 05/28/2011 0349   HCT 32.9* 05/28/2011 0349   PLT 137* 05/28/2011 0349   MCV 84.8 05/28/2011 0349   MCH 27.3 05/28/2011 0349   MCHC 32.2 05/28/2011 0349   RDW 17.9* 05/28/2011 0349   LYMPHSABS 1.2 05/28/2011 0349   MONOABS 1.2* 05/28/2011 0349   EOSABS 0.1 05/28/2011 0349   BASOSABS 0.0 05/28/2011 0349    BMET    Component Value Date/Time   NA 142 05/28/2011 0349   K 4.4 05/28/2011 0349   CL 109 05/28/2011 0349   CO2 20 05/28/2011 0349   GLUCOSE 144* 05/28/2011 0349   BUN 45* 05/28/2011 0349   CREATININE 3.57* 05/28/2011 0349   CALCIUM 8.5 05/28/2011 0349   GFRNONAA 12* 05/28/2011 0349   GFRAA 14*  05/28/2011 0349    COAG Lab Results  Component Value Date   INR 1.20 05/28/2011   INR 1.20 05/27/2011   INR 1.27 05/25/2011   No results found for this basename: PTT    I/O last 3 completed shifts: In: 1361.3 [I.V.:1293.3; NG/GT:60; IV Piggyback:8] Out: 2670 [Urine:2470; Emesis/NG output:200]    NGT Drainage: Out  Physical Examination Patient Vitals for the past 24 hrs:  BP Temp Temp src Pulse Resp SpO2 Weight  05/28/11 0700 137/32 mmHg - - 63  20  95 % -  05/28/11 0600 122/54 mmHg - - 66  22  94 % 244 lb 4.3 oz (110.8 kg)  05/28/11 0500 135/68 mmHg - - 65  21  93 % -  05/28/11 0406 - 98.5 F (36.9 C) Oral - - - -  05/28/11 0400 134/45 mmHg - - 65  24  95 % -  05/28/11 0350 - - - 66  25  96 % -  05/28/11 0300 148/33 mmHg - - 64  23  96 % -  05/28/11 0200 142/34 mmHg - - 64  23  96 % -  05/28/11 0100 127/35 mmHg - - 62  20  96 % -  05/28/11 0015 - 98.5 F (36.9 C) Oral - - - -  05/28/11 0000 137/43 mmHg - - 66  22  96 % -  05/27/11 2300 147/45 mmHg 99 F (37.2 C) Core 70  28  96 % -  05/27/11 2200 - 99.3 F (37.4 C) Core 61  22  97 % -  05/27/11 2100 157/56 mmHg 99.5 F (37.5 C) Core 66  29  94 % -  05/27/11 2000 142/35 mmHg 99.5 F (37.5 C) - 63  27  95 % -  05/27/11 1900 142/39 mmHg 99.3 F (37.4 C) - 63  27  98 % -  05/27/11 1800 154/44 mmHg 99 F (37.2 C) - 67  24  93 % -  05/27/11 1700 138/46 mmHg 98.6 F (37 C) - 65  26  95 % -  05/27/11 1600 - 98.1 F (36.7 C) Oral 66  22  92 % -  05/27/11 1500 - 98.2 F (36.8 C) - 60  23  96 % -  05/27/11 1400 - 98.1 F (36.7 C) - 64  21  97 % -  05/27/11 1300 - 97.9 F (36.6 C) - 58  22  99 % -  05/27/11 1200 - 98.3 F (36.8 C) Oral 60  23  98 % -  05/27/11 1150 152/44 mmHg - - 62  22  98 % -  05/27/11 1100 - - - 59  23  97 % -  05/27/11 1000 - - - 59  22  95 % -  05/27/11 0900 - - - 56  22  96 % -  05/27/11 0800 - 97.6 F (36.4 C) Oral 54  22  96 % -    O2 Sats 96 % on Nasal cannula (liters/minute);  2  LPM  General: A&O x 3, WDWN female in NAD Pulmonary: normal non-labored breathing , without Rales, rhonchi,  wheezing Cardiac: Heart rate : regular ,  Abdomen:abdomen soft and no bowel sounds Abdominal wound:clean, dry, intact  Groin wounds without drainage Neurologic: A&O X 3; Appropriate Affect ; SENSATION: normal; MOTOR FUNCTION:  moving all extremities equally. Speech is fluent/normal  Vascular Exam:BLE warm and well perfused Extremities without ischemic changes, no Gangrene , no cellulitis; no open wounds;   LOWER EXTREMITY PULSES           RIGHT                                      LEFT      POSTERIOR TIBIAL Monophasic doppler doppler - Monophasic       DORSALIS PEDIS      ANTERIOR TIBIAL doppler -Monophasic doppler - Monophasic    Non-Invasive Vascular Imaging ABI'S: Post OP ABIs have been performed. Right Brachial pressure is 161 and the left is 168. Right ABI is 0.85 and Left ABI is 0.82.   Assessment: Norma [name] is a 65 y.o. female who is 3 Days Post-Op aorto bifem bypass ATN _ BUN CR still climbing on 80 lasix BID U/O adequate Need to mobilize Post-op Ilieus as expacted Plan: Ice chips only OOB, PT/OT Dc aline Change dressings daily Dulcolax supp today Renal consult        Patient is doing very well. Hopefully f Renal function will level off and begin to improve. Excellent UO with lasix. Creat 3.6. Will get renal consult to help with fluid management. Lungs fairly clear. Feet warm and well perfused. Check ABIs today. Con tinue NPO except ice chips.Have asked Dr Florene Glen to see to follow.

## 2011-05-29 ENCOUNTER — Inpatient Hospital Stay (HOSPITAL_COMMUNITY): Payer: Medicare Other

## 2011-05-29 ENCOUNTER — Encounter (HOSPITAL_COMMUNITY): Payer: Self-pay | Admitting: Vascular Surgery

## 2011-05-29 DIAGNOSIS — N179 Acute kidney failure, unspecified: Secondary | ICD-10-CM

## 2011-05-29 LAB — COMPREHENSIVE METABOLIC PANEL
ALT: 80 U/L — ABNORMAL HIGH (ref 0–35)
Alkaline Phosphatase: 182 U/L — ABNORMAL HIGH (ref 39–117)
BUN: 53 mg/dL — ABNORMAL HIGH (ref 6–23)
CO2: 22 mEq/L (ref 19–32)
Calcium: 8.5 mg/dL (ref 8.4–10.5)
GFR calc Af Amer: 13 mL/min — ABNORMAL LOW (ref >90)
GFR calc non Af Amer: 12 mL/min — ABNORMAL LOW (ref >90)
Glucose, Bld: 140 mg/dL — ABNORMAL HIGH (ref 70–99)
Sodium: 144 mEq/L (ref 135–145)
Total Protein: 5.6 g/dL — ABNORMAL LOW (ref 6.0–8.3)

## 2011-05-29 LAB — GLUCOSE, CAPILLARY
Glucose-Capillary: 135 mg/dL — ABNORMAL HIGH (ref 70–99)
Glucose-Capillary: 141 mg/dL — ABNORMAL HIGH (ref 70–99)
Glucose-Capillary: 147 mg/dL — ABNORMAL HIGH (ref 70–99)

## 2011-05-29 LAB — CBC
HCT: 31.1 % — ABNORMAL LOW (ref 36.0–46.0)
Hemoglobin: 9.8 g/dL — ABNORMAL LOW (ref 12.0–15.0)
MCH: 27.3 pg (ref 26.0–34.0)
MCHC: 31.5 g/dL (ref 30.0–36.0)
RBC: 3.59 MIL/uL — ABNORMAL LOW (ref 3.87–5.11)

## 2011-05-29 MED ORDER — PANTOPRAZOLE SODIUM 40 MG PO TBEC
40.0000 mg | DELAYED_RELEASE_TABLET | Freq: Every day | ORAL | Status: DC
  Administered 2011-05-29 – 2011-06-07 (×9): 40 mg via ORAL
  Filled 2011-05-29 (×7): qty 1

## 2011-05-29 MED ORDER — DIPHENHYDRAMINE HCL 12.5 MG/5ML PO ELIX
12.5000 mg | ORAL_SOLUTION | Freq: Four times a day (QID) | ORAL | Status: DC | PRN
  Filled 2011-05-29: qty 5

## 2011-05-29 MED ORDER — SODIUM CHLORIDE 0.9 % IJ SOLN: 9.0000 mL | INTRAMUSCULAR | Status: DC | PRN

## 2011-05-29 MED ORDER — DIPHENHYDRAMINE HCL 50 MG/ML IJ SOLN: 12.5000 mg | Freq: Four times a day (QID) | INTRAMUSCULAR | Status: DC | PRN

## 2011-05-29 MED ORDER — ONDANSETRON HCL 4 MG/2ML IJ SOLN: 4.0000 mg | Freq: Four times a day (QID) | INTRAMUSCULAR | Status: DC | PRN

## 2011-05-29 MED ORDER — NALOXONE HCL 0.4 MG/ML IJ SOLN: 0.4000 mg | INTRAMUSCULAR | Status: DC | PRN

## 2011-05-29 MED ORDER — METOPROLOL TARTRATE 25 MG PO TABS
25.0000 mg | ORAL_TABLET | Freq: Two times a day (BID) | ORAL | Status: DC
  Administered 2011-05-29 – 2011-05-30 (×2): 25 mg via ORAL
  Filled 2011-05-29 (×5): qty 1

## 2011-05-29 MED ORDER — INSULIN ASPART 100 UNIT/ML ~~LOC~~ SOLN
0.0000 [IU] | SUBCUTANEOUS | Status: DC
  Filled 2011-05-29: qty 3

## 2011-05-29 MED ORDER — SODIUM CHLORIDE 0.9 % IV SOLN: INTRAVENOUS | Status: DC

## 2011-05-29 MED ORDER — MORPHINE SULFATE (PF) 1 MG/ML IV SOLN: INTRAVENOUS | Status: DC

## 2011-05-29 NOTE — Progress Notes (Signed)
Subjective: Interval History: has complaints feet hurting when standing.  Objective: Vital signs in last 24 hours: Temp:  [98.1 F (36.7 C)-99.6 F (37.6 C)] 98.8 F (37.1 C) (11/20 0415) Pulse Rate:  [59-71] 63  (11/20 0700) Resp:  [13-31] 25  (11/20 0700) BP: (118-163)/(32-98) 142/42 mmHg (11/20 0600) SpO2:  [85 %-99 %] 99 % (11/20 0700) Weight:  [109.1 kg (240 lb 8.4 oz)] 240 lb 8.4 oz (109.1 kg) (11/20 0600) Weight change: -1.7 kg (-3 lb 12 oz)  Intake/Output from previous day: 11/19 0701 - 11/20 0700 In: 536 [I.V.:460; IV Piggyback:76] Out: 2875 [Urine:2875] Intake/Output this shift:    General appearance: alert, cooperative and no distress Resp: clear to auscultation bilaterally GI: soft, non-tender; bowel sounds normal; no masses,  no organomegaly Extremities: edema 2+, feet without atheroembolic stigmata Neurologic: Alert and oriented X 3, normal strength and tone. Normal symmetric reflexes. Normal coordination and gait  Lab Results:  Basename 05/29/11 0420 05/28/11 0349  WBC 10.8* 13.7*  HGB 9.8* 10.6*  HCT 31.1* 32.9*  PLT 156 137*   BMET:  Basename 05/29/11 0420 05/28/11 0349  NA 144 142  K 4.3 4.4  CL 109 109  CO2 22 20  GLUCOSE 140* 144*  BUN 53* 45*  CREATININE 3.77* 3.57*  CALCIUM 8.5 8.5   No results found for this basename: PTH:2 in the last 72 hours Iron Studies: No results found for this basename: IRON,TIBC,TRANSFERRIN,FERRITIN in the last 72 hours  Studies/Results: Dg Chest Port 1 View  05/28/2011  *RADIOLOGY REPORT*  Clinical Data: Patient removed and tracheal tube yesterday.  PORTABLE CHEST - 1 VIEW  Comparison: 05/27/2011.  Findings: Interval extubation with removal of the right IJ Swan- Ganz catheter and nasogastric tube as well. The right IJ catheter sheath remains in place.  Heart size stable.  Sternal wires are unchanged in position.  Lungs are low in volume with bibasilar air space disease and probable bilateral pleural effusions.   Aeration may have improved slightly in the interval.  IMPRESSION: Low lung volumes with bibasilar air space disease and small bilateral pleural effusions.  Overall aeration may improve slightly in the interval.  Original Report Authenticated By: Luretha Rued, M.D.    I have reviewed the patient's current medications.  Assessment/Plan: Acute Kidney Injury, Nonoliguric  Hemodynamically medicated secondary to clamping aorta, rule out atheroembolic-doubt  Volume Overload  Severe Peripheral Vascular Disease  Painful feet post-op Abnormal Liver function Tests ? Due to atheroembolic shower or ischemia Diabetes Mellitus  Coronary Artery Disease  Hypertension  Dyslipidemia   Plan: Continue to Diurese, support hemodynamically,f/u C3,C4, and LFTs     LOS: 4 days   Norma [name] C 05/29/2011,7:10 AM

## 2011-05-29 NOTE — Progress Notes (Addendum)
Patient ID: Norma [name], female   DOB: 1946/04/24, 65 y.o.   MRN: [id number]  [facility] [place]  Post-op  Aorto Bi fem Bypass  Date of Surgery: 05/25/2011 Surgeon: Juliann Mule): Clayton Bibles Annamarie Major, MD Mal Misty, MD POD: 4 Days Post-Op  History of Present Illness  Norma [name] is a 65 y.o. female who is  up s/p  AORTA BIFEMORAL BYPASS GRAFT Pt is doing well. denies incisional pain; denies nausea/vomiting; denies diarrhea. has had flatus;has had multiple BM and is hungry.  Dg Chest Port 1 View  05/28/2011  *RADIOLOGY REPORT*  Clinical Data: Patient removed and tracheal tube yesterday.  PORTABLE CHEST - 1 VIEW  Comparison: 05/27/2011.  Findings: Interval extubation with removal of the right IJ Swan- Ganz catheter and nasogastric tube as well. The right IJ catheter sheath remains in place.  Heart size stable.  Sternal wires are unchanged in position.  Lungs are low in volume with bibasilar air space disease and probable bilateral pleural effusions.  Aeration may have improved slightly in the interval.  IMPRESSION: Low lung volumes with bibasilar air space disease and small bilateral pleural effusions.  Overall aeration may improve slightly in the interval.  Original Report Authenticated By: Luretha Rued, M.D.    Significant Diagnostic Studies: CBC    Component Value Date/Time   WBC 10.8* 05/29/2011 0420   RBC 3.59* 05/29/2011 0420   HGB 9.8* 05/29/2011 0420   HCT 31.1* 05/29/2011 0420   PLT 156 05/29/2011 0420   MCV 86.6 05/29/2011 0420   MCH 27.3 05/29/2011 0420   MCHC 31.5 05/29/2011 0420   RDW 17.5* 05/29/2011 0420   LYMPHSABS 1.2 05/28/2011 0349   MONOABS 1.2* 05/28/2011 0349   EOSABS 0.1 05/28/2011 0349   BASOSABS 0.0 05/28/2011 0349    BMET    Component Value Date/Time   NA 144 05/29/2011 0420   K 4.3 05/29/2011 0420   CL 109 05/29/2011 0420   CO2 22 05/29/2011 0420   GLUCOSE 140* 05/29/2011 0420   BUN 53* 05/29/2011 0420   CREATININE 3.77* 05/29/2011 0420   CALCIUM 8.5 05/29/2011 0420   GFRNONAA 12* 05/29/2011 0420   GFRAA 13* 05/29/2011 0420    COAG Lab Results  Component Value Date   INR 1.20 05/28/2011   INR 1.20 05/27/2011   INR 1.27 05/25/2011   No results found for this basename: PTT    I/O last 3 completed shifts: In: 806 [I.V.:730; IV Piggyback:76] Out: 3680 [Urine:3680]    NGT Drainage:Patient Vitals for the past 24 hrs:  Stool Color  05/28/11 0930 Brown;Green  05/28/11 0900 Brown;Green    Physical Examination Patient Vitals for the past 24 hrs:  BP Temp Temp src Pulse Resp SpO2 Weight  05/29/11 0748 - 98.2 F (36.8 C) Oral - - - -  05/29/11 0700 - - - 63  25  99 % -  05/29/11 0600 142/42 mmHg - - 68  14  97 % 240 lb 8.4 oz (109.1 kg)  05/29/11 0500 131/66 mmHg - - 71  21  94 % -  05/29/11 0415 - 98.8 F (37.1 C) Oral - - - -  05/29/11 0400 127/50 mmHg - - 68  23  95 % -  05/29/11 0300 122/35 mmHg - - 63  16  98 % -  05/29/11 0200 146/43 mmHg - - 66  20  96 % -  05/29/11 0100 132/42 mmHg - - 64  16  97 % -  05/29/11 0036 - 99.6 F (37.6 C) Oral - - - -  05/29/11 0000 124/44 mmHg - - 63  13  94 % -  05/28/11 2300 124/98 mmHg - - 65  16  96 % -  05/28/11 2200 138/42 mmHg - - 64  21  95 % -  05/28/11 2100 118/32 mmHg - - 61  16  95 % -  05/28/11 2013 - - - 62  25  94 % -  05/28/11 2006 - 99.4 F (37.4 C) Oral - - - -  05/28/11 2000 145/44 mmHg - - 66  25  95 % -  05/28/11 1900 151/42 mmHg - - 63  18  97 % -  05/28/11 1800 154/42 mmHg - - 66  18  97 % -  05/28/11 1700 152/42 mmHg - - 66  26  96 % -  05/28/11 1625 - 99 F (37.2 C) Oral - - - -  05/28/11 1600 146/55 mmHg - - 61  22  94 % -  05/28/11 1500 153/44 mmHg - - 61  19  96 % -  05/28/11 1434 155/50 mmHg - - 66  24  85 % -  05/28/11 1405 163/43 mmHg - - 59  21  94 % -  05/28/11 1400 163/43 mmHg - - 60  22  93 % -  05/28/11 1300 155/46 mmHg - - 59  25  95 % -  05/28/11 1200 158/39 mmHg - - 62  23  94 % -  05/28/11  1140 - 98.1 F (36.7 C) Oral - - - -  05/28/11 1100 162/88 mmHg - - 64  31  92 % -  05/28/11 1000 159/48 mmHg - - 65  27  94 % -  05/28/11 0900 158/44 mmHg - - 62  23  96 % -  05/28/11 0800 159/44 mmHg - - 60  26  96 % -    O2 Sats 98 % on Nasal cannula (liters/minute);  2 LPM  General: A&O x 3, WDWN female in NAD Pulmonary: normal non-labored breathing , without Rales, rhonchi,  wheezing Cardiac: Heart rate : regular ,  Abdomen:abdomen soft and normal active bowel sounds Abdominal wound:draining at the lower protion  Neurologic: A&O X 3; Appropriate Affect ; SENSATION: normal; MOTOR FUNCTION:  moving all extremities equally. Speech is fluent/normal  Vascular Exam:BLE warm and well perfused Extremities without ischemic changes, no Gangrene , no cellulitis; no open wounds;    Non-Invasive Vascular Imaging ABI'S: Right 0.85; Left 0.65  Assessment: Norma [name] is a 65 y.o. female who is 4 Days Post-Op Procedure(s): AORTA BIFEMORAL BYPASS GRAFT ilieus resolving ATN CR still going up Drainage from wounds  Plan: cont care Ambulate Cl.liq Incisional vac to wounds    [place] Prime [phone] 05/29/2011 7:57 AM      Continues to make progress.Renal fcn leveling off. Excellent UO a good sign. Begin clear liquids.

## 2011-05-29 NOTE — Plan of Care (Signed)
Problem: Phase II Progression Outcomes Goal: Progress activity as tolerated unless otherwise ordered Pt 3 assist up to chair

## 2011-05-29 NOTE — Progress Notes (Signed)
Patient ID: Norma [name], female   DOB: [birth year], 65 y.o.   MRN: [id number] Norma [name] is a 65 y.o. female admitted on 05/25/2011 for scheduled aorta bifemoral bypass graft endarterectomy mesenteric celiac/renal. ARF s/p cross clamping of renals with rising Cr. PCCM asked to help with ICU care.    LINES / TUBES  11/16 OETT>>> 11/17 11/16 R IJ Swan + introducor>>> 11/17 PAC out>>>  CULTURES  None  ABX  Peri op cefuroxime 11/16>>>11/17  BEST PRACTICE GI: Protonix  DVT: Hep sq Nutrition: clear liquid  PROTOCOLS / CONSULTANTS  11/16 Intermittent Sedation>>> 11/17 11/16 ICU SSI>>>   KEY EVENTS / STUDIES    Subjective: Pt doing well, no complaints, UOP better.  PHYICAL EXAM:  Filed Vitals:   05/29/11 0900  BP: 145/35  Pulse: 74  Temp:   Resp: 26      General: chronically ill  Awake and alert. Neuro: no focal deficits CV: s1s2 regular, bradycardic resolved PULM: clear GI: obese/soft, bsx4 hypoactive, midline abd incision c/d/i  Extremities: Warm/dry, generalized anasarca    Lab data:    Component Value Date/Time   WBC 10.8* 05/29/2011 0420   RBC 3.59* 05/29/2011 0420   HGB 9.8* 05/29/2011 0420   HCT 31.1* 05/29/2011 0420   PLT 156 05/29/2011 0420   MCV 86.6 05/29/2011 0420   MCH 27.3 05/29/2011 0420   MCHC 31.5 05/29/2011 0420   RDW 17.5* 05/29/2011 0420   LYMPHSABS 1.2 05/28/2011 0349   MONOABS 1.2* 05/28/2011 0349   EOSABS 0.1 05/28/2011 0349   BASOSABS 0.0 05/28/2011 0349   BMET    Component Value Date/Time   NA 144 05/29/2011 0420   K 4.3 05/29/2011 0420   CL 109 05/29/2011 0420   CO2 22 05/29/2011 0420   GLUCOSE 140* 05/29/2011 0420   BUN 53* 05/29/2011 0420   CREATININE 3.77* 05/29/2011 0420   CALCIUM 8.5 05/29/2011 0420   GFRNONAA 12* 05/29/2011 0420   GFRAA 13* 05/29/2011 0420      RADIOLOGIC DATA  Dg Chest Port 1 View  05/29/2011  *RADIOLOGY REPORT*  Clinical Data: Postop for vascular surgery  PORTABLE CHEST - 1 VIEW   Comparison: 05/28/2011  Findings: Prior median sternotomy.  Right IJ Cordis sheath is unchanged.  Cardiomegaly accentuated by AP portable technique. Right hemidiaphragm elevation.  Probable small bilateral pleural effusions persist. No pneumothorax.  Low lung volumes with resultant pulmonary interstitial prominence.  Similar mild bibasilar atelectasis.  IMPRESSION: No change in cardiomegaly with low lung volumes and small bilateral pleural effusions/bibasilar atelectasis.  Original Report Authenticated By: Areta Haber, M.D.   Dg Chest Port 1 View  05/28/2011  *RADIOLOGY REPORT*  Clinical Data: Patient removed and tracheal tube yesterday.  PORTABLE CHEST - 1 VIEW  Comparison: 05/27/2011.  Findings: Interval extubation with removal of the right IJ Swan- Ganz catheter and nasogastric tube as well. The right IJ catheter sheath remains in place.  Heart size stable.  Sternal wires are unchanged in position.  Lungs are low in volume with bibasilar air space disease and probable bilateral pleural effusions.  Aeration may have improved slightly in the interval.  IMPRESSION: Low lung volumes with bibasilar air space disease and small bilateral pleural effusions.  Overall aeration may improve slightly in the interval.  Original Report Authenticated By: Luretha Rued, M.D.    ASSESSMENT/PLAN:  Principal Problem:  *S/P aorto-bifemoral bypass surgery  Active Problems:  PAD (peripheral artery disease)  Respiratory failure, acute   1. Post-op Respiratory failure s/p  aorto-bifem bypass. Prolonged surgical time due to extensive vascular disease in setting of morbidly obese.  PLAN: -ok extubated.  2. S/P aorto-bifem bypass per Dr. Kellie Simmering  PLAN:  -Rec's per Dr. [facility]  -dopamine per [facility]  3. Oliguria. Required cross clamp of aorta for approx 1 hr, ARF PLAN:  Cr plateauing. Per renal  4. DM / Hyperglycemia  PLAN: SSI   5 Diet: per [facility]  Still with ileus  6.dvtr prev  Hep SQ          ???tfr to  3300???  Per [facility]  Sol Passer PCCM Service   Beeper  [phone]  Cell  [phone]

## 2011-05-29 NOTE — Progress Notes (Signed)
Physical Therapy Treatment Patient Details Name: Norma [name] MRN: [id number] DOB: 03-02-1946 Today's Date: 05/29/2011  PT Assessment/Plan  PT - Assessment/Plan Comments on Treatment Session: Patient is making slow progress secondary to pain in bil LEs and appears to be self limiting as well.  Will continue PT to try and motivate pt. to progress mobility.  Pt. will need SNF at D/C.   PT Plan: Discharge plan remains appropriate;Frequency remains appropriate PT Frequency: Min 3X/week Follow Up Recommendations: Skilled nursing facility;24 hour supervision/assistance Equipment Recommended: Defer to next venue PT Goals  Acute Rehab PT Goals PT Goal: Supine/Side to Sit - Progress: Progressing toward goal PT Transfer Goal: Sit to Stand/Stand to Sit - Progress: Progressing toward goal PT Goal: Ambulate - Progress: Progressing toward goal PT Goal: Perform Home Exercise Program - Progress: Progressing toward goal  PT Treatment Precautions/Restrictions  Precautions Precautions: Fall Required Braces or Orthoses: No Restrictions Weight Bearing Restrictions: No Mobility (including Balance) Bed Mobility Rolling Left: 1: +2 Total assist;Patient percentage (comment) (pt. = 40%) Rolling Left Details (indicate cue type and reason): Pt. continues to be limited by pain needing tactile assist and cues Left Sidelying to Sit: 1: +2 Total assist;Patient percentage (comment);HOB elevated (comment degrees);With rails (HOB at 30 degrees, cues to sequence, pt. = 40%) Supine to Sit: Not tested (comment) Transfers Sit to Stand: 1: +2 Total assist;Patient percentage (comment);From bed;With upper extremity assist;From elevated surface (pt. = 50%) Sit to Stand Details (indicate cue type and reason): Pt. needed maximal encouragement to participate as pt. very self limiting.  Pt. continues to keep bil knees and hips flexed somewhat thus causing buckling of knees. Stand to Sit: 1: +2 Total assist;Patient percentage  (comment);With upper extremity assist;With armrests;To chair/3-in-1 (pt. = 50 %) Stand to Sit Details: Pt. with uncontrolled descent.  Offered to follow patient with chair so she could ambulate and pt. refused to walk. Stand Pivot Transfers: 1: +2 Total assist (pt. = 50 %) Stand Pivot Transfer Details (indicate cue type and reason): Pt. used RW and moved feet to pivot to chair Ambulation/Gait Ambulation/Gait: No (Refused to ambulate) Stairs: No Wheelchair Mobility Wheelchair Mobility: No  Posture/Postural Control Posture/Postural Control: Postural limitations Postural Limitations: Forward flexed trunk and bent knees and hips Balance Balance Assessed: No Exercise  General Exercises - Lower Extremity Ankle Circles/Pumps: AROM;5 reps;Both;Seated Quad Sets: Both;AROM;5 reps;Seated End of Session PT - End of Session Equipment Utilized During Treatment: Gait belt Activity Tolerance: Patient limited by fatigue;Patient limited by pain (Feel pt. is self limiting) Patient left: in chair;with call bell in reach;with family/visitor present Nurse Communication: Mobility status for transfers General Behavior During Session: [facility] for tasks performed Cognition: [facility] for tasks performed  INGOLD,Shaneice Barsanti 05/29/2011, 3:55 PM [facility] Acute Rehabilitation [phone] (pager)

## 2011-05-30 DIAGNOSIS — J9600 Acute respiratory failure, unspecified whether with hypoxia or hypercapnia: Secondary | ICD-10-CM

## 2011-05-30 LAB — GLUCOSE, CAPILLARY
Glucose-Capillary: 176 mg/dL — ABNORMAL HIGH (ref 70–99)
Glucose-Capillary: 163 mg/dL — ABNORMAL HIGH (ref 70–99)
Glucose-Capillary: 153 mg/dL — ABNORMAL HIGH (ref 70–99)

## 2011-05-30 LAB — CBC
MCH: 27.2 pg (ref 26.0–34.0)
MCHC: 31.4 g/dL (ref 30.0–36.0)
MCV: 86.8 fL (ref 78.0–100.0)
Platelets: 187 10*3/uL (ref 150–400)
RDW: 17.3 % — ABNORMAL HIGH (ref 11.5–15.5)

## 2011-05-30 LAB — COMPREHENSIVE METABOLIC PANEL
ALT: 56 U/L — ABNORMAL HIGH (ref 0–35)
AST: 43 U/L — ABNORMAL HIGH (ref 0–37)
Calcium: 8.6 mg/dL (ref 8.4–10.5)
Sodium: 141 mEq/L (ref 135–145)
Total Protein: 5.9 g/dL — ABNORMAL LOW (ref 6.0–8.3)

## 2011-05-30 LAB — C3 COMPLEMENT: C3 Complement: 111 mg/dL (ref 90–180)

## 2011-05-30 MED ORDER — GABAPENTIN 100 MG PO CAPS
100.0000 mg | ORAL_CAPSULE | Freq: Two times a day (BID) | ORAL | Status: DC
  Administered 2011-05-30 – 2011-06-02 (×7): 100 mg via ORAL
  Filled 2011-05-30 (×8): qty 1

## 2011-05-30 MED ORDER — INSULIN ASPART 100 UNIT/ML ~~LOC~~ SOLN
0.0000 [IU] | Freq: Three times a day (TID) | SUBCUTANEOUS | Status: DC
  Administered 2011-05-30: 1 [IU] via SUBCUTANEOUS
  Administered 2011-05-30 – 2011-05-31 (×3): 2 [IU] via SUBCUTANEOUS
  Administered 2011-05-31: 3 [IU] via SUBCUTANEOUS
  Filled 2011-05-30: qty 3

## 2011-05-30 MED ORDER — GABAPENTIN 600 MG PO TABS: 100.0000 mg | ORAL_TABLET | Freq: Two times a day (BID) | ORAL | Status: DC

## 2011-05-30 MED ORDER — METOPROLOL TARTRATE 25 MG PO TABS
25.0000 mg | ORAL_TABLET | Freq: Two times a day (BID) | ORAL | Status: DC
  Administered 2011-05-30 – 2011-06-01 (×6): 25 mg via ORAL
  Filled 2011-05-30 (×8): qty 1

## 2011-05-30 MED ORDER — INSULIN ASPART 100 UNIT/ML ~~LOC~~ SOLN: 0.0000 [IU] | Freq: Every day | SUBCUTANEOUS | Status: DC

## 2011-05-30 MED FILL — Heparin Sodium (Porcine) Inj 1000 Unit/ML: INTRAMUSCULAR | Qty: 30 | Status: AC

## 2011-05-30 MED FILL — Sodium Chloride IV Soln 0.9%: INTRAVENOUS | Qty: 1000 | Status: AC

## 2011-05-30 MED FILL — Sodium Chloride Irrigation Soln 0.9%: Qty: 3000 | Status: AC

## 2011-05-30 NOTE — Progress Notes (Signed)
CSW met with pt to discuss consult for SNF. For assessment, please see Brief Psychosocial Assessment in pt's chart. Pt is declining a SNF search at this time, as she would like to have time to meet with her family to discuss this option. Pt requested that CSW meet with pt on Monday, 06/04/11, to discuss SNF search. CSW inquired about follow up on Friday, 06/01/11, which pt declined. CSW shared that she would be available if a concern arose between now and next meeting. CSW will continue to follow to assess discharge needs.   Darden Dates, MSW, [place]

## 2011-05-30 NOTE — Consult Note (Signed)
WOC consult Note Reason for Consult: Consult requested for vac application to abd incision and bilat groin  Incisions. Staples intact to abd wound, no open areas or drainage at this time.  Left and right groin incisions applear to be closed with dermabond with similar appearance. Dressing procedure/placement/frequency: Applied incisional vac to all sites as requested, skin protected with adaptic.  Vac literature states incisional dressings may remain in place up to 7 days without dressing changes required.  Please order when D/C desired. Will not plan to follow further unless re-consulted.  [address], [place], MSN, [place]

## 2011-05-30 NOTE — Progress Notes (Addendum)
Patient ID: Norma [name], female   DOB: August 10, 1945, 65 y.o.   MRN: [id number]  [facility]   Post-op  OPEN AAA Repair  Date of Surgery: 05/25/2011 Surgeon: Juliann Mule): Clayton Bibles Annamarie Major, MD Mal Misty, MD POD: 5 Days Post-Op  History of Present Illness  Norma [name] is a 65 y.o. female who is  up s/p Procedure(s): AORTA BIFEMORAL BYPASS GRAFT Pt is doing well. complains of incisional pain; denies nausea/vomiting; denies diarrhea. has had flatus;has had BM  Dg Chest [facility] 1 View  05/29/2011  *RADIOLOGY REPORT*  Clinical Data: Postop for vascular surgery  PORTABLE CHEST - 1 VIEW  Comparison: 05/28/2011  Findings: Prior median sternotomy.  Right IJ Cordis sheath is unchanged.  Cardiomegaly accentuated by AP portable technique. Right hemidiaphragm elevation.  Probable small bilateral pleural effusions persist. No pneumothorax.  Low lung volumes with resultant pulmonary interstitial prominence.  Similar mild bibasilar atelectasis.  IMPRESSION: No change in cardiomegaly with low lung volumes and small bilateral pleural effusions/bibasilar atelectasis.  Original Report Authenticated By: Areta Haber, M.D.    Significant Diagnostic Studies: CBC    Component Value Date/Time   WBC 9.2 05/30/2011 0430   RBC 3.49* 05/30/2011 0430   HGB 9.5* 05/30/2011 0430   HCT 30.3* 05/30/2011 0430   PLT 187 05/30/2011 0430   MCV 86.8 05/30/2011 0430   MCH 27.2 05/30/2011 0430   MCHC 31.4 05/30/2011 0430   RDW 17.3* 05/30/2011 0430   LYMPHSABS 1.2 05/28/2011 0349   MONOABS 1.2* 05/28/2011 0349   EOSABS 0.1 05/28/2011 0349   BASOSABS 0.0 05/28/2011 0349    BMET    Component Value Date/Time   NA 141 05/30/2011 0430   K 4.0 05/30/2011 0430   CL 105 05/30/2011 0430   CO2 25 05/30/2011 0430   GLUCOSE 155* 05/30/2011 0430   BUN 56* 05/30/2011 0430   CREATININE 3.49* 05/30/2011 0430   CALCIUM 8.6 05/30/2011 0430   GFRNONAA 13* 05/30/2011 0430   GFRAA 15*  05/30/2011 0430    COAG Lab Results  Component Value Date   INR 1.20 05/28/2011   INR 1.20 05/27/2011   INR 1.27 05/25/2011   No results found for this basename: PTT    I/O last 3 completed shifts: In: 1350 [P.O.:600; I.V.:740; IV Piggyback:10] Out: [id number] [Urine:4245]    NGT Drainage:Patient Vitals for the past 24 hrs:  Stool Color  05/29/11 0900 Brown    Physical Examination BP Readings from Last 3 Encounters:  05/30/11 118/29  05/30/11 118/29  05/23/11 184/61   Temp Readings from Last 3 Encounters:  05/30/11 98.7 F (37.1 C) Oral  05/30/11 98.7 F (37.1 C) Oral  05/23/11 98.6 F (37 C) Oral   SpO2 Readings from Last 3 Encounters:  05/30/11 97%  05/30/11 97%  05/23/11 93%    O2 Sats 97 % on Nasal cannula (liters/minute);  2 LPM  General: A&O x 3, WDWN female in NAD Pulmonary: normal non-labored breathing , without  rhonchi,  wheezing Cardiac: Heart rate : regular ,  Abdomen:abdomen soft and normal active bowel sounds Abdominal wound:clean, dry, intact  Neurologic: A&O X 3; Appropriate Affect ; SENSATION: normal; MOTOR FUNCTION:  moving all extremities equally. Speech is fluent/normal  Vascular Exam:BLE warm and well perfused Extremities without ischemic changes, no Gangrene , no cellulitis; no open wounds;   LOWER EXTREMITY PULSES           RIGHT  LEFT      POSTERIOR TIBIAL doppler doppler       DORSALIS PEDIS      ANTERIOR TIBIAL doppler doppler   Assessment: Norma [name] is a 65 y.o. female who is 5 Days Post-Op Procedure(s): AORTA BIFEMORAL BYPASS GRAFT C/O pain in both feet - no history of gout Hx DM -poss neuropathy  Plan:  OOB ambulate [place]    Richrd Prime [id number] 05/30/2011 8:14 AM      Agree with above.  Pt to be transferred to 3300.  Abd wound with drainige, incisional vac to be placed.  Creatinine decreasing today with good uop.  Advance diet today, and ambulate

## 2011-05-30 NOTE — Plan of Care (Signed)
Problem: Phase II Progression Outcomes Goal: Wound healing without signs/symptoms of infection Outcome: Not Progressing Wound vac applied to mid abdominal and bilateral femoral incisions Goal: Progress activity as tolerated unless otherwise ordered Outcome: Not Progressing Required Clarise Cruz Lift to transfer from bed to chair. Pt has extreme pain in feet and unable to ambulate

## 2011-05-30 NOTE — Progress Notes (Signed)
  Patient ID: Norma [name], female   DOB: 1946/03/25, 65 y.o.   MRN: [id number] Norma [name] is a 65 y.o. female admitted on 05/25/2011 for scheduled aorta bifemoral bypass graft endarterectomy mesenteric celiac/renal. ARF s/p cross clamping of renals with rising Cr. PCCM asked to help with ICU care.    LINES / TUBES  11/16 OETT>>> 11/17 11/16 R IJ Swan + introducor>>> 11/17 PAC out>>>  CULTURES  None  ABX  Peri op cefuroxime 11/16>>>11/17  BEST PRACTICE GI: Protonix  DVT: Hep sq Nutrition: clear liquid  PROTOCOLS / CONSULTANTS  11/16 Intermittent Sedation>>> 11/17 11/16 ICU SSI>>>   KEY EVENTS / STUDIES    Subjective: Pt doing well, no complaints, UOP better.  PHYICAL EXAM:  Filed Vitals:   05/30/11 1126  BP:   Pulse:   Temp: 98.4 F (36.9 C)  Resp:       General: chronically ill  Awake and alert. Neuro: no focal deficits CV: s1s2 regular, bradycardic resolved PULM: clear GI: obese/soft, bsx4 hypoactive, midline abd incision c/d/i  Extremities: Warm/dry, generalized anasarca    Lab data:    Component Value Date/Time   WBC 9.2 05/30/2011 0430   RBC 3.49* 05/30/2011 0430   HGB 9.5* 05/30/2011 0430   HCT 30.3* 05/30/2011 0430   PLT 187 05/30/2011 0430   MCV 86.8 05/30/2011 0430   MCH 27.2 05/30/2011 0430   MCHC 31.4 05/30/2011 0430   RDW 17.3* 05/30/2011 0430   LYMPHSABS 1.2 05/28/2011 0349   MONOABS 1.2* 05/28/2011 0349   EOSABS 0.1 05/28/2011 0349   BASOSABS 0.0 05/28/2011 0349   BMET    Component Value Date/Time   NA 141 05/30/2011 0430   K 4.0 05/30/2011 0430   CL 105 05/30/2011 0430   CO2 25 05/30/2011 0430   GLUCOSE 155* 05/30/2011 0430   BUN 56* 05/30/2011 0430   CREATININE 3.49* 05/30/2011 0430   CALCIUM 8.6 05/30/2011 0430   GFRNONAA 13* 05/30/2011 0430   GFRAA 15* 05/30/2011 0430      RADIOLOGIC DATA  Dg Chest Port 1 View  05/29/2011  *RADIOLOGY REPORT*  Clinical Data: Postop for vascular surgery  PORTABLE CHEST - 1 VIEW   Comparison: 05/28/2011  Findings: Prior median sternotomy.  Right IJ Cordis sheath is unchanged.  Cardiomegaly accentuated by AP portable technique. Right hemidiaphragm elevation.  Probable small bilateral pleural effusions persist. No pneumothorax.  Low lung volumes with resultant pulmonary interstitial prominence.  Similar mild bibasilar atelectasis.  IMPRESSION: No change in cardiomegaly with low lung volumes and small bilateral pleural effusions/bibasilar atelectasis.  Original Report Authenticated By: Areta Haber, M.D.    ASSESSMENT/PLAN:  Principal Problem:  *S/P aorto-bifemoral bypass surgery  Active Problems:  PAD (peripheral artery disease)  Respiratory failure, acute    1. S/P aorto-bifem bypass per Dr. Kellie Simmering  PLAN:  -Rec's per Dr. [facility]    2. Oliguria. Required cross clamp of aorta for approx 1 hr, ARF PLAN:  Cr plateauing. Per renal  3. DM / Hyperglycemia  PLAN: SSI   4 Diet: per [facility]VS  Still with ileus  5.dvtr prev  Hep SQ          ???tfr to 3300???  Per [facility]  Asencion Noble  PCCM will see again 11/23. Call prn 11/22   Asencion Noble PCCM Service   Beeper  [phone]  Cell  [phone]

## 2011-05-30 NOTE — Progress Notes (Signed)
Utilization review completed. Rozanna Boer, RN, BSN. 05/30/11

## 2011-05-30 NOTE — Progress Notes (Signed)
Subjective: Interval History:Still c/o right foot discomfort  Objective: Vital signs in last 24 hours: Temp:  [98.1 F (36.7 C)-99.6 F (37.6 C)] 98.7 F (37.1 C) (11/21 0749) Pulse Rate:  [56-74] 57  (11/21 0700) Resp:  [13-26] 14  (11/21 0700) BP: (114-160)/(29-49) 118/29 mmHg (11/21 0700) SpO2:  [91 %-98 %] 97 % (11/21 0700) Weight change:   Intake/Output from previous day: 11/20 0701 - 11/21 0700 In: 1080 [P.O.:600; I.V.:480] Out: 2910 [Urine:2910] Intake/Output this shift:    General appearance: alert and cooperative Head: Normocephalic, without obvious abnormality, atraumatic, facial swelling, blind O.S. Resp: clear to auscultation bilaterally Cardio: regular rate and rhythm, S1, S2 normal, no murmur, click, rub or gallop Extremities: edema 2+-3+  Lab Results:  Basename 05/30/11 0430 05/29/11 0420  WBC 9.2 10.8*  HGB 9.5* 9.8*  HCT 30.3* 31.1*  PLT 187 156   BMET:  Basename 05/30/11 0430 05/29/11 0420  NA 141 144  K 4.0 4.3  CL 105 109  CO2 25 22  GLUCOSE 155* 140*  BUN 56* 53*  CREATININE 3.49* 3.77*  CALCIUM 8.6 8.5   Studies/Results: Dg Chest Port 1 View  05/29/2011  *RADIOLOGY REPORT*  Clinical Data: Postop for vascular surgery  PORTABLE CHEST - 1 VIEW  Comparison: 05/28/2011  Findings: Prior median sternotomy.  Right IJ Cordis sheath is unchanged.  Cardiomegaly accentuated by AP portable technique. Right hemidiaphragm elevation.  Probable small bilateral pleural effusions persist. No pneumothorax.  Low lung volumes with resultant pulmonary interstitial prominence.  Similar mild bibasilar atelectasis.  IMPRESSION: No change in cardiomegaly with low lung volumes and small bilateral pleural effusions/bibasilar atelectasis.  Original Report Authenticated By: Areta Haber, M.D.    I have reviewed the patient's current medications.  Assessment/Plan: Acute Kidney Injury, Nonoliguric slow improvement. Continue to diurese Hemodynamically mediated secondary  to clamping aorta   Volume Overload, slowly improving Severe Peripheral Vascular Disease  Painful feet post-op  Abnormal Liver function Tests impoving Diabetes Mellitus  Coronary Artery Disease  Hypertension  Dyslipidemia  Plan: Continue to Diurese, support hemodynamically     LOS: 5 days   Khaylee Mcevoy C 05/30/2011,8:24 AM

## 2011-05-30 NOTE — Progress Notes (Signed)
CSW met with pt to inform her that if she is medically ready to discharge on Friday, then CSW will meet with pt. Pt was agreeable to this. CSW will continue to follow to assess discharge needs.   Darden Dates, MSW, [place]

## 2011-05-31 LAB — GLUCOSE, CAPILLARY

## 2011-05-31 LAB — COMPREHENSIVE METABOLIC PANEL
Albumin: 2.1 g/dL — ABNORMAL LOW (ref 3.5–5.2)
Alkaline Phosphatase: 75 U/L (ref 39–117)
BUN: 56 mg/dL — ABNORMAL HIGH (ref 6–23)
Potassium: 3.8 mEq/L (ref 3.5–5.1)
Sodium: 141 mEq/L (ref 135–145)
Total Protein: 5.8 g/dL — ABNORMAL LOW (ref 6.0–8.3)

## 2011-05-31 LAB — HEMOGLOBIN A1C
Hgb A1c MFr Bld: 5.5 % (ref <5.7)
Mean Plasma Glucose: 111 mg/dL (ref <117)

## 2011-05-31 MED ORDER — AMLODIPINE BESYLATE 10 MG PO TABS
10.0000 mg | ORAL_TABLET | Freq: Every day | ORAL | Status: DC
  Administered 2011-06-01 – 2011-06-08 (×8): 10 mg via ORAL
  Filled 2011-05-31 (×8): qty 1

## 2011-05-31 MED ORDER — HYDRALAZINE HCL 50 MG PO TABS
50.0000 mg | ORAL_TABLET | Freq: Three times a day (TID) | ORAL | Status: DC
  Administered 2011-05-31 – 2011-06-03 (×8): 50 mg via ORAL
  Filled 2011-05-31 (×12): qty 1

## 2011-05-31 MED ORDER — INSULIN ASPART 100 UNIT/ML ~~LOC~~ SOLN
0.0000 [IU] | Freq: Every day | SUBCUTANEOUS | Status: DC
  Administered 2011-06-01 – 2011-06-04 (×3): 2 [IU] via SUBCUTANEOUS

## 2011-05-31 MED ORDER — ALISKIREN FUMARATE 150 MG PO TABS
300.0000 mg | ORAL_TABLET | Freq: Every day | ORAL | Status: DC
  Administered 2011-06-01 – 2011-06-03 (×3): 300 mg via ORAL
  Filled 2011-05-31 (×4): qty 2

## 2011-05-31 MED ORDER — CALCIUM CARBONATE-VITAMIN D 250-125 MG-UNIT PO TABS
1.0000 | ORAL_TABLET | Freq: Every day | ORAL | Status: DC
  Filled 2011-05-31: qty 1

## 2011-05-31 MED ORDER — ONE-DAILY MULTI VITAMINS PO TABS
1.0000 | ORAL_TABLET | Freq: Every day | ORAL | Status: DC
  Administered 2011-06-01 – 2011-06-08 (×8): 1 via ORAL
  Filled 2011-05-31 (×8): qty 1

## 2011-05-31 MED ORDER — SIMVASTATIN 20 MG PO TABS
20.0000 mg | ORAL_TABLET | Freq: Every day | ORAL | Status: DC
  Administered 2011-06-01 – 2011-06-08 (×8): 20 mg via ORAL
  Filled 2011-05-31 (×8): qty 1

## 2011-05-31 MED ORDER — CARVEDILOL 12.5 MG PO TABS
12.5000 mg | ORAL_TABLET | Freq: Two times a day (BID) | ORAL | Status: DC
  Administered 2011-05-31 – 2011-06-08 (×17): 12.5 mg via ORAL
  Filled 2011-05-31 (×19): qty 1

## 2011-05-31 MED ORDER — INSULIN ASPART 100 UNIT/ML ~~LOC~~ SOLN
0.0000 [IU] | Freq: Three times a day (TID) | SUBCUTANEOUS | Status: DC
  Administered 2011-06-01: 2 [IU] via SUBCUTANEOUS
  Administered 2011-06-01 – 2011-06-02 (×3): 3 [IU] via SUBCUTANEOUS
  Administered 2011-06-02: 2 [IU] via SUBCUTANEOUS
  Administered 2011-06-03: 3 [IU] via SUBCUTANEOUS
  Administered 2011-06-03 – 2011-06-04 (×4): 2 [IU] via SUBCUTANEOUS
  Administered 2011-06-04: 3 [IU] via SUBCUTANEOUS
  Administered 2011-06-05 – 2011-06-06 (×4): 2 [IU] via SUBCUTANEOUS
  Administered 2011-06-06: 3 [IU] via SUBCUTANEOUS
  Administered 2011-06-06 – 2011-06-07 (×2): 2 [IU] via SUBCUTANEOUS
  Administered 2011-06-07: 3 [IU] via SUBCUTANEOUS
  Administered 2011-06-07: 2 [IU] via SUBCUTANEOUS
  Administered 2011-06-08: 3 [IU] via SUBCUTANEOUS
  Filled 2011-05-31: qty 3

## 2011-05-31 NOTE — Progress Notes (Signed)
Pt transferred to 3304 in a wheelchair. Pt transferred to bedside commode with nurse.

## 2011-05-31 NOTE — Progress Notes (Signed)
S:Feet feel better O:BP 135/37  Pulse 60  Temp(Src) 98.2 F (36.8 C) (Oral)  Resp 14  Ht 5\' 2"  (1.575 m)  Wt 106.5 kg (234 lb 12.6 oz)  BMI 42.94 kg/m2  SpO2 95%  Intake/Output Summary (Last 24 hours) at 05/31/11 0857 Last data filed at 05/31/11 0800  Gross per 24 hour  Intake   1064 ml  Output   2775 ml  Net  -1711 ml   Weight change:  [id number] more comforable CVS:RRR gr 2/6SEM Resp:coarse bs  [id number] and soft Ext:2-3+BLE edema    . docusate  100 mg Oral Daily  . furosemide  80 mg Intravenous Q8H  . gabapentin  100 mg Oral BID  . heparin subcutaneous  5,000 Units Subcutaneous Q8H  . insulin aspart  0-5 Units Subcutaneous QHS  . insulin aspart  0-9 Units Subcutaneous TID WC  . metoprolol tartrate  25 mg Oral BID  . pantoprazole  40 mg Oral Q1200   No results found. BMET  Lab 05/31/11 0501 05/30/11 0430 05/29/11 0420 05/28/11 0349 05/27/11 1719 05/27/11 0455 05/26/11 1400  NA 141 141 144 142 141 141 141  K 3.8 4.0 4.3 4.4 4.3 4.4 4.3  CL 103 105 109 109 109 110 111  CO2 26 25 22 20 19 19 19   GLUCOSE 151* 155* 140* 144* 135* 185* 141*  BUN 56* 56* 53* 45* 40* 34* 30*  CREATININE 3.12* 3.49* 3.77* 3.57* 3.21* 2.75* 2.39*  ALB -- -- -- -- -- -- --  CALCIUM 8.6 8.6 8.5 8.5 8.1* 7.8* 7.5*  PHOS -- -- -- -- -- -- --   CBC  Lab 05/30/11 0430 05/29/11 0420 05/28/11 0349 05/27/11 0455 05/25/11 2006  WBC 9.2 10.8* 13.7* 12.9* --  NEUTROABS -- -- 11.2* 10.6* 9.4*  HGB 9.5* 9.8* 10.6* 11.7* --  HCT 30.3* 31.1* 32.9* 36.8 --  MCV 86.8 86.6 84.8 84.6 --  PLT 187 156 137* 156 --     Assessment/Plan:  Acute Kidney Injury, Nonoliguric slow improvement. Continuing to diurese Hemodynamically mediated secondary to clamping aorta  Volume Overload, slowly improving  Severe Peripheral Vascular Disease  Painful feet post-op  Abnormal Liver function Tests impoving  Diabetes Mellitus  Coronary Artery Disease  Hypertension  Dyslipidemia   Plan: Continue to Diurese,  support hemodynamically   Norma [name] C

## 2011-05-31 NOTE — Progress Notes (Signed)
   SUBJECTIVE: comfortable. Tolerating diet.  PHYSICAL EXAM: Filed Vitals:   05/31/11 0723  BP:   Pulse:   Temp: 98.2 F (36.8 C)  Resp:     Intake/Output Summary (Last 24 hours) at 05/31/11 0736 Last data filed at 05/31/11 0600  Gross per 24 hour  Intake   1094 ml  Output   2700 ml  Net  -1606 ml    Lungs: clear to auscultation Incisions: Incisional VAC in place to abdomen and groins  LABS: Lab Results  Component Value Date   WBC 9.2 05/30/2011   HGB 9.5* 05/30/2011   HCT 30.3* 05/30/2011   MCV 86.8 05/30/2011   PLT 187 05/30/2011   Lab Results  Component Value Date   CREATININE 3.12* 05/31/2011   Lab Results  Component Value Date   INR 1.20 05/28/2011   CBG (last 3)   Basename 05/30/11 2133 05/30/11 1743 05/30/11 1139  GLUCAP 153* 163* 176*   Anti-infectives     Start     Dose/Rate Route Frequency Ordered Stop   05/25/11 2200   cefUROXime (ZINACEF) 1.5 g in dextrose 5 % 50 mL IVPB        1.5 g 100 mL/hr over 30 Minutes Intravenous Every 12 hours 05/25/11 1924 05/26/11 1026           ASSESSMENT/PLAN: 1. 6 Days Post-Op s/p: AORTA BIFEMORAL BYPASS GRAFT 2. Renal: crt continues to improve 3. Awaiting bed on 3300 4. Incisional VAC in place (to be changed in 6 days according to would care team) 5. D/C foley 6. D/C swan sleeve 7. DM: under reasonable control 2. Continue PTx  Deitra Mayo, MD [facility] 05/31/2011

## 2011-06-01 DIAGNOSIS — J9600 Acute respiratory failure, unspecified whether with hypoxia or hypercapnia: Secondary | ICD-10-CM

## 2011-06-01 DIAGNOSIS — N179 Acute kidney failure, unspecified: Secondary | ICD-10-CM

## 2011-06-01 DIAGNOSIS — Z9911 Dependence on respirator [ventilator] status: Secondary | ICD-10-CM

## 2011-06-01 DIAGNOSIS — I509 Heart failure, unspecified: Secondary | ICD-10-CM

## 2011-06-01 LAB — GLUCOSE, CAPILLARY: Glucose-Capillary: 180 mg/dL — ABNORMAL HIGH (ref 70–99)

## 2011-06-01 MED ORDER — FUROSEMIDE 10 MG/ML IJ SOLN
80.0000 mg | Freq: Two times a day (BID) | INTRAMUSCULAR | Status: DC
  Administered 2011-06-01 – 2011-06-07 (×12): 80 mg via INTRAVENOUS
  Filled 2011-06-01 (×10): qty 8
  Filled 2011-06-01: qty 4
  Filled 2011-06-01 (×2): qty 8
  Filled 2011-06-01: qty 4
  Filled 2011-06-01 (×3): qty 8

## 2011-06-01 MED ORDER — CALCIUM CARBONATE-VITAMIN D 500-200 MG-UNIT PO TABS
0.5000 | ORAL_TABLET | Freq: Every day | ORAL | Status: DC
  Administered 2011-06-01 – 2011-06-02 (×2): via ORAL
  Administered 2011-06-03: 0.5 via ORAL
  Administered 2011-06-04: 11:00:00 via ORAL
  Administered 2011-06-05 – 2011-06-06 (×2): 0.5 via ORAL
  Administered 2011-06-07: 250 via ORAL
  Administered 2011-06-08: 0.5 via ORAL
  Filled 2011-06-01 (×8): qty 0.5

## 2011-06-01 NOTE — Progress Notes (Signed)
Pt refused PT secondary to fatigue after being up to [facility] twice.  Will continue attempts.  [employer] PT [phone]

## 2011-06-01 NOTE — Progress Notes (Signed)
S:Does not feel well today O:BP 143/49  Pulse 60  Temp(Src) 98.8 F (37.1 C) (Oral)  Resp 21  Ht 5\' 2"  (1.575 m)  Wt 106.5 kg (234 lb 12.6 oz)  BMI 42.94 kg/m2  SpO2 95%  Intake/Output Summary (Last 24 hours) at 06/01/11 1300 Last data filed at 06/01/11 0900  Gross per 24 hour  Intake    120 ml  Output    725 ml  Net   -605 ml   Weight change:  [id number] CVS:RRR Resp:clear anteriorly [id number].soft Ext:2+ edema b    . aliskiren  300 mg Oral Daily  . amLODipine  10 mg Oral Daily  . calcium-vitamin D  0.5 tablet Oral Daily  . carvedilol  12.5 mg Oral BID WC  . docusate  100 mg Oral Daily  . furosemide  80 mg Intravenous Q8H  . gabapentin  100 mg Oral BID  . heparin subcutaneous  5,000 Units Subcutaneous Q8H  . hydrALAZINE  50 mg Oral TID  . insulin aspart  0-5 Units Subcutaneous QHS  . insulin aspart  0-9 Units Subcutaneous TID WC  . metoprolol tartrate  25 mg Oral BID  . multivitamin  1 tablet Oral Daily  . pantoprazole  40 mg Oral Q1200  . simvastatin  20 mg Oral Daily  . DISCONTD: calcium-vitamin D  1 tablet Oral Daily  . DISCONTD: insulin aspart  0-5 Units Subcutaneous QHS  . DISCONTD: insulin aspart  0-9 Units Subcutaneous TID WC   No results found. BMET  Lab 05/31/11 0501 05/30/11 0430 05/29/11 0420 05/28/11 0349 05/27/11 1719 05/27/11 0455 05/26/11 1400  NA 141 141 144 142 141 141 141  K 3.8 4.0 4.3 4.4 4.3 4.4 4.3  CL 103 105 109 109 109 110 111  CO2 26 25 22 20 19 19 19   GLUCOSE 151* 155* 140* 144* 135* 185* 141*  BUN 56* 56* 53* 45* 40* 34* 30*  CREATININE 3.12* 3.49* 3.77* 3.57* 3.21* 2.75* 2.39*  ALB -- -- -- -- -- -- --  CALCIUM 8.6 8.6 8.5 8.5 8.1* 7.8* 7.5*  PHOS -- -- -- -- -- -- --   CBC  Lab 05/30/11 0430 05/29/11 0420 05/28/11 0349 05/27/11 0455 05/25/11 2006  WBC 9.2 10.8* 13.7* 12.9* --  NEUTROABS -- -- 11.2* 10.6* 9.4*  HGB 9.5* 9.8* 10.6* 11.7* --  HCT 30.3* 31.1* 32.9* 36.8 --  MCV 86.8 86.6 84.8 84.6 --  PLT 187 156 137* 156 --      Assessment/Plan: Acute Kidney Injury, Nonoliguric slow improvement. Continuing to diurese  Hemodynamically mediated secondary to clamping aorta  Volume Overload, slowly improving  Severe Peripheral Vascular Disease  Painful feet post-op  Abnormal Liver function Tests impoving  Diabetes Mellitus  Coronary Artery Disease  Hypertension  Reduce Furosemide to BID  Jayesh Marbach C

## 2011-06-01 NOTE — Progress Notes (Signed)
   SUBJECTIVE: complains of some back pain from lying in bed.  PHYSICAL EXAM: Filed Vitals:   05/31/11 1636 05/31/11 1900 05/31/11 2326 06/01/11 0300  BP: 164/40 151/40 147/41 149/33  Pulse: 60 67  66  Temp: 97.6 F (36.4 C) 99.1 F (37.3 C) 98 F (36.7 C) 99.1 F (37.3 C)  TempSrc: Oral Oral Oral Oral  Resp: 15 18  18   Height:      Weight:      SpO2: 94% 95%  95%   Lungs: clear to auscultation VAC in place on all 3 incisions. Abdomen soft and nontender with normal pitched bowel sounds.  LABS: No new labs  CBG (last 3)   Basename 05/31/11 2219 05/31/11 1613 05/31/11 0748  GLUCAP 180* 173* 161*    ASSESSMENT/PLAN: 1. 7 Days Post-Op s/p: AORTA BIFEMORAL BYPASS GRAFT 2. Continue VAC. 3. Continue physical therapy. 4. Diabetes under good control 5. Follow up creatinine tomorrow  Deitra Mayo, MD, FACS Beeper: [phone] 06/01/2011

## 2011-06-01 NOTE — Progress Notes (Signed)
Pt is not ready for discharge. CSW will meet with pt regarding discharge plan. Pt's RN is aware CSW is working with pt. CSW will continue to follow.   Darden Dates, MSW, [place]

## 2011-06-01 NOTE — Progress Notes (Signed)
  Patient ID: Norma [name], female   DOB: 01-29-1946, 65 y.o.   MRN: [id number] Norma [name] is a 65 y.o. female admitted on 05/25/2011 for scheduled aorta bifemoral bypass graft endarterectomy mesenteric celiac/renal. ARF s/p cross clamping of renals with rising creatinine PCCM asked to help with ICU care.  LINES / TUBES  11/16 OETT>>> 11/17 11/16 R IJ Swan + introducor>>> 11/17 PAC out>>>  CULTURES  None  ABX  Peri op cefuroxime 11/16>>>11/17  BEST PRACTICE GI: Protonix  DVT: Hep sq Nutrition: clear liquid  PROTOCOLS / CONSULTANTS  11/16 Intermittent Sedation>>> 11/17 11/16 ICU SSI>>>   KEY EVENTS / STUDIES:   Subjective: Patient states that she is doing well.  She says that she didn't sleep good last night because her back bothered her.  She ask to be sit in a chair.  She states that she eat fine and denies any problems.  Denies HA, CP, SOP, Diarrhea, Nausea, Vomiting.  PHYICAL EXAM:  Filed Vitals:   06/01/11 1245  BP: 143/49  Pulse: 60  Temp: 98.8 F (37.1 C)  Resp: 21   General:  Awake, alert, and oriented sitting in chair eating lunch Neuro:   No focal deficit noted HEENT:  EOMI, no JVD, no LAD, no discharge PUL:  CTAB, no adventitious sounds noted CV:  RRR, s1 and s2 heard, no murmurs/rubs/gallops GI:   Obese, (+) BS in all quadrants, soft, non tender, obesity distention present Extremities:  Intact and warm to touch, (+2/3) edema present, pulses not appreciated  Lab data:    Component Value Date/Time   WBC 9.2 05/30/2011 0430   RBC 3.49* 05/30/2011 0430   HGB 9.5* 05/30/2011 0430   HCT 30.3* 05/30/2011 0430   PLT 187 05/30/2011 0430   MCV 86.8 05/30/2011 0430   MCH 27.2 05/30/2011 0430   MCHC 31.4 05/30/2011 0430   RDW 17.3* 05/30/2011 0430   LYMPHSABS 1.2 05/28/2011 0349   MONOABS 1.2* 05/28/2011 0349   EOSABS 0.1 05/28/2011 0349   BASOSABS 0.0 05/28/2011 0349   BMET    Component Value Date/Time   NA 141 05/31/2011 0501   K 3.8 05/31/2011  0501   CL 103 05/31/2011 0501   CO2 26 05/31/2011 0501   GLUCOSE 151* 05/31/2011 0501   BUN 56* 05/31/2011 0501   CREATININE 3.12* 05/31/2011 0501   CALCIUM 8.6 05/31/2011 0501   GFRNONAA 15* 05/31/2011 0501   GFRAA 17* 05/31/2011 0501      RADIOLOGIC DATA  No results found.  ASSESSMENT/PLAN:   1. S/P aorto-bifem bypass per Dr. Kellie Simmering  Assessment:  No problem reported, patient is ambulating PLAN:  -Continue per vascular surgery plan  2. Oliguria. Required cross clamp of aorta for approx 1 hr, ARF Assessment:  Good negative balance but creatinine remains elevated PLAN:  Lasix is on board and reduced today per renal likely needs chem panel in am   3. DM / Hyperglycemia  Assessment:  Chronic problem PLAN:   Insulin is onboard Continue to monitor Overall within range 140-180  4.  Diet, patient is tolerating normal diet  5.  DVT prevention  Heparin SQ         6.  Will d/c from PCCM service, call if further evaluation is needed.   DANG, DONG, [place] Titus Mould, MD, [place] Pgr: [facility]

## 2011-06-02 LAB — GLUCOSE, CAPILLARY
Glucose-Capillary: 200 mg/dL — ABNORMAL HIGH (ref 70–99)
Glucose-Capillary: 222 mg/dL — ABNORMAL HIGH (ref 70–99)
Glucose-Capillary: 228 mg/dL — ABNORMAL HIGH (ref 70–99)

## 2011-06-02 LAB — RENAL FUNCTION PANEL
BUN: 61 mg/dL — ABNORMAL HIGH (ref 6–23)
Calcium: 8.6 mg/dL (ref 8.4–10.5)
Creatinine, Ser: 3.16 mg/dL — ABNORMAL HIGH (ref 0.50–1.10)
Glucose, Bld: 218 mg/dL — ABNORMAL HIGH (ref 70–99)
Phosphorus: 4.3 mg/dL (ref 2.3–4.6)

## 2011-06-02 LAB — URINALYSIS, ROUTINE W REFLEX MICROSCOPIC
Glucose, UA: NEGATIVE mg/dL
Leukocytes, UA: NEGATIVE
Nitrite: NEGATIVE
pH: 5 (ref 5.0–8.0)

## 2011-06-02 MED ORDER — SODIUM CHLORIDE 0.9 % IJ SOLN
INTRAMUSCULAR | Status: AC
  Administered 2011-06-02: 20 mL
  Filled 2011-06-02: qty 20

## 2011-06-02 MED ORDER — FUROSEMIDE 10 MG/ML IJ SOLN: 120.0000 mg | Freq: Once | INTRAMUSCULAR | Status: DC

## 2011-06-02 MED ORDER — FUROSEMIDE 10 MG/ML IJ SOLN
120.0000 mg | Freq: Once | INTRAVENOUS | Status: AC
  Administered 2011-06-02: 120 mg via INTRAVENOUS
  Filled 2011-06-02: qty 12

## 2011-06-02 NOTE — Progress Notes (Addendum)
   SUBJECTIVE: No complaints  PHYSICAL EXAM: Filed Vitals:   06/01/11 1900 06/01/11 2209 06/01/11 2300 06/02/11 0300  BP: 147/41 136/42 146/40 118/35  Pulse: 62 63 65 61  Temp: 100.6 F (38.1 C)  99.4 F (37.4 C) 98.6 F (37 C)  TempSrc: Oral  Oral Oral  Resp: 18  21 16   Height:      Weight:      SpO2: 94%  93% 95%   Lungs: clear to auscultation Abd: soft, normal pitched bowel sounds VAC in place to lower incision and groins.  LABS: Lab Results  Component Value Date   WBC 9.2 05/30/2011   HGB 9.5* 05/30/2011   HCT 30.3* 05/30/2011   MCV 86.8 05/30/2011   PLT 187 05/30/2011   Lab Results  Component Value Date   CREATININE 3.16* 06/02/2011   Lab Results  Component Value Date   INR 1.20 05/28/2011   CBG (last 3)   Basename 06/01/11 2144 05/31/11 2219 05/31/11 1613  GLUCAP 205* 180* 173*     ASSESSMENT/PLAN: 1. 8 Days Post-Op s/p: AORTA BIFEMORAL BYPASS GRAFT 2. Renal: CRT has leveled off. Nurse notified me of low urine output. CRT is stable. Renal following. They adjusted her LASIX yest. Will defer to them.  3. Low grade fever (100.6) Encourage IS. Check WBC in AM. If temp increases will need to change VAC sooner than planned.  4. Cont PTx 5. Transfer to [place], MD, FACS Beeper: [phone] 06/02/2011

## 2011-06-02 NOTE — Progress Notes (Signed)
S:feels better today O:BP 109/34  Pulse 62  Temp(Src) 98.8 F (37.1 C) (Oral)  Resp 14  Ht 5\' 2"  (1.575 m)  Wt 106.5 kg (234 lb 12.6 oz)  BMI 42.94 kg/m2  SpO2 97%  Intake/Output Summary (Last 24 hours) at 06/02/11 0816 Last data filed at 06/02/11 0700  Gross per 24 hour  Intake    212 ml  Output    505 ml  Net   -293 ml   Weight change:  [id number] female CVS:rrr Resp:clear anteriorly [id number] Ext:2+ BLE edema      . aliskiren  300 mg Oral Daily  . amLODipine  10 mg Oral Daily  . calcium-vitamin D  0.5 tablet Oral Daily  . carvedilol  12.5 mg Oral BID WC  . docusate  100 mg Oral Daily  . furosemide  80 mg Intravenous Q12H  . gabapentin  100 mg Oral BID  . heparin subcutaneous  5,000 Units Subcutaneous Q8H  . hydrALAZINE  50 mg Oral TID  . insulin aspart  0-5 Units Subcutaneous QHS  . insulin aspart  0-9 Units Subcutaneous TID WC  . metoprolol tartrate  25 mg Oral BID  . multivitamin  1 tablet Oral Daily  . pantoprazole  40 mg Oral Q1200  . simvastatin  20 mg Oral Daily  . sodium chloride      . DISCONTD: furosemide  80 mg Intravenous Q8H   No results found. BMET  Lab 06/02/11 0525 05/31/11 0501 05/30/11 0430 05/29/11 0420 05/28/11 0349 05/27/11 1719 05/27/11 0455  NA 144 141 141 144 142 141 141  K 3.9 3.8 4.0 4.3 4.4 4.3 4.4  CL 103 103 105 109 109 109 110  CO2 27 26 25 22 20 19 19   GLUCOSE 218* 151* 155* 140* 144* 135* 185*  BUN 61* 56* 56* 53* 45* 40* 34*  CREATININE 3.16* 3.12* 3.49* 3.77* 3.57* 3.21* 2.75*  ALB -- -- -- -- -- -- --  CALCIUM 8.6 8.6 8.6 8.5 8.5 8.1* 7.8*  PHOS 4.3 -- -- -- -- -- --   CBC  Lab 05/30/11 0430 05/29/11 0420 05/28/11 0349 05/27/11 0455  WBC 9.2 10.8* 13.7* 12.9*  NEUTROABS -- -- 11.2* 10.6*  HGB 9.5* 9.8* 10.6* 11.7*  HCT 30.3* 31.1* 32.9* 36.8  MCV 86.8 86.6 84.8 84.6  PLT 187 156 137* 156  Assessment/Plan: Acute Kidney Injury, with decreasing urine output Hemodynamically mediated secondary to clamping aorta    Volume Overload, slowly improving  Severe Peripheral Vascular Disease  Painful feet post-op  Abnormal Liver function Tests impoving  Diabetes Mellitus  Coronary Artery Disease  Hypertension  Give extra furosemide today, but discouraged about delay in improvement  Jessalynn Mccowan C

## 2011-06-03 LAB — RENAL FUNCTION PANEL
Albumin: 1.9 g/dL — ABNORMAL LOW (ref 3.5–5.2)
BUN: 69 mg/dL — ABNORMAL HIGH (ref 6–23)
Calcium: 8.3 mg/dL — ABNORMAL LOW (ref 8.4–10.5)
Glucose, Bld: 208 mg/dL — ABNORMAL HIGH (ref 70–99)
Phosphorus: 4.5 mg/dL (ref 2.3–4.6)
Potassium: 3.7 mEq/L (ref 3.5–5.1)
Sodium: 138 mEq/L (ref 135–145)

## 2011-06-03 LAB — CBC
HCT: 26.9 % — ABNORMAL LOW (ref 36.0–46.0)
MCH: 26.9 pg (ref 26.0–34.0)
MCV: 86.2 fL (ref 78.0–100.0)
Platelets: 355 10*3/uL (ref 150–400)
RDW: 16.9 % — ABNORMAL HIGH (ref 11.5–15.5)

## 2011-06-03 LAB — GLUCOSE, CAPILLARY: Glucose-Capillary: 198 mg/dL — ABNORMAL HIGH (ref 70–99)

## 2011-06-03 MED ORDER — FERROUS SULFATE 325 (65 FE) MG PO TABS
325.0000 mg | ORAL_TABLET | Freq: Two times a day (BID) | ORAL | Status: DC
  Administered 2011-06-03 – 2011-06-08 (×11): 325 mg via ORAL
  Filled 2011-06-03 (×13): qty 1

## 2011-06-03 MED ORDER — HYDRALAZINE HCL 25 MG PO TABS
25.0000 mg | ORAL_TABLET | Freq: Three times a day (TID) | ORAL | Status: DC
  Administered 2011-06-03 – 2011-06-08 (×15): 25 mg via ORAL
  Filled 2011-06-03 (×19): qty 1

## 2011-06-03 NOTE — Progress Notes (Signed)
S:Doesn't feel well O:BP 109/60  Pulse 60  Temp(Src) 98.7 F (37.1 C) (Oral)  Resp 18  Ht 5\' 2"  (1.575 m)  Wt 106.5 kg (234 lb 12.6 oz)  BMI 42.94 kg/m2  SpO2 97%  Intake/Output Summary (Last 24 hours) at 06/03/11 1422 Last data filed at 06/02/11 1800  Gross per 24 hour  Intake    300 ml  Output      0 ml  Net    300 ml   Weight change:  Gen:not as well appearing CVS:rr no rub Resp:clear [id number] Ext:1-2+ Generalized weakness    . aliskiren  300 mg Oral Daily  . amLODipine  10 mg Oral Daily  . calcium-vitamin D  0.5 tablet Oral Daily  . carvedilol  12.5 mg Oral BID WC  . docusate  100 mg Oral Daily  . ferrous sulfate  325 mg Oral BID WC  . furosemide  80 mg Intravenous Q12H  . heparin subcutaneous  5,000 Units Subcutaneous Q8H  . hydrALAZINE  50 mg Oral TID  . insulin aspart  0-5 Units Subcutaneous QHS  . insulin aspart  0-9 Units Subcutaneous TID WC  . multivitamin  1 tablet Oral Daily  . pantoprazole  40 mg Oral Q1200  . simvastatin  20 mg Oral Daily   No results found. BMET  Lab 06/03/11 0700 06/02/11 0525 05/31/11 0501 05/30/11 0430 05/29/11 0420 05/28/11 0349 05/27/11 1719  NA 138 144 141 141 144 142 141  K 3.7 3.9 3.8 4.0 4.3 4.4 4.3  CL 100 103 103 105 109 109 109  CO2 25 27 26 25 22 20 19   GLUCOSE 208* 218* 151* 155* 140* 144* 135*  BUN 69* 61* 56* 56* 53* 45* 40*  CREATININE 3.78* 3.16* 3.12* 3.49* 3.77* 3.57* 3.21*  ALB -- -- -- -- -- -- --  CALCIUM 8.3* 8.6 8.6 8.6 8.5 8.5 8.1*  PHOS 4.5 4.3 -- -- -- -- --   CBC  Lab 06/03/11 0700 05/30/11 0430 05/29/11 0420 05/28/11 0349  WBC 15.1* 9.2 10.8* 13.7*  NEUTROABS -- -- -- 11.2*  HGB 8.4* 9.5* 9.8* 10.6*  HCT 26.9* 30.3* 31.1* 32.9*  MCV 86.2 86.8 86.6 84.8  PLT 355 187 156 137*     Assessment/Plan: Acute Kidney Injury, with decreasing urine output? cortical injury  Hemodynamically mediated secondary to clamping aorta and prox renal art endarterectomies Volume Overload, slowly improving    Severe Peripheral Vascular Disease  Painful feet post-op  Abnormal Liver function Tests impoving  Diabetes Mellitus  Coronary Artery Disease  Hypertension , better controlled Give extra furosemide today, but discouraged about delay in improvement Reduce Hydralazine Norma [name] C

## 2011-06-03 NOTE — Progress Notes (Addendum)
[facility] [place] [facility]  Post-op  OPEN AAA Repair  Date of Surgery: 05/25/2011 Surgeon: Kellie Simmering POD: 9 Days Post-Op  History of Present Illness  Norma [name] is a 65 y.o. female who is  up s/p Procedure(s): AORTA BIFEMORAL BYPASS GRAFT Pt is doing well. complains of incisional pain; denies nausea/vomiting; denies diarrhea. has had flatus;has had BM Slow to mobilize. Wound vac/ incisional vac in place  Significant Diagnostic Studies: CBC    Component Value Date/Time   WBC 15.1* 06/03/2011 0700   RBC 3.12* 06/03/2011 0700   HGB 8.4* 06/03/2011 0700   HCT 26.9* 06/03/2011 0700   PLT 355 06/03/2011 0700   MCV 86.2 06/03/2011 0700   MCH 26.9 06/03/2011 0700   MCHC 31.2 06/03/2011 0700   RDW 16.9* 06/03/2011 0700   LYMPHSABS 1.2 05/28/2011 0349   MONOABS 1.2* 05/28/2011 0349   EOSABS 0.1 05/28/2011 0349   BASOSABS 0.0 05/28/2011 0349    BMET    Component Value Date/Time   NA 144 06/02/2011 0525   K 3.9 06/02/2011 0525   CL 103 06/02/2011 0525   CO2 27 06/02/2011 0525   GLUCOSE 218* 06/02/2011 0525   BUN 61* 06/02/2011 0525   CREATININE 3.16* 06/02/2011 0525   CALCIUM 8.6 06/02/2011 0525   GFRNONAA 14* 06/02/2011 0525   GFRAA 17* 06/02/2011 0525    COAG Lab Results  Component Value Date   INR 1.20 05/28/2011   INR 1.20 05/27/2011   INR 1.27 05/25/2011   No results found for this basename: PTT    I/O last 3 completed shifts: In: [id number] [P.O.:465; I.V.:480; IV Piggyback:12] Out: 655 [Urine:655]    Physical Examination BP Readings from Last 3 Encounters:  06/03/11 109/60  06/03/11 109/60  05/23/11 184/61   Temp Readings from Last 3 Encounters:  06/03/11 98.7 F (37.1 C) Oral  06/03/11 98.7 F (37.1 C) Oral  05/23/11 98.6 F (37 C) Oral   SpO2 Readings from Last 3 Encounters:  06/03/11 97%  06/03/11 97%  05/23/11 93%    General: A&O x 3, WDWN female in NAD Speech is fluent/normal Pulmonary: normal non-labored breathing    Cardiac: Heart rate : regular  Abdomen:abdomen soft and normal active bowel sounds Abdominal wound: with wound vac in palceNeurologic: A&O X 3; Appropriate Affect ; :BLE warm and well perfused Extremities without ischemic changes, no Gangrene , no cellulitis; no open wounds;  SENSATION: normal; MOTOR FUNCTION:  moving all extremities equally.   Assessment: Norma [name] is a 65 y.o. female who is 9 Days Post-Op Procedure(s): AORTA BIFEMORAL BYPASS GRAFT Post -op acute blood loss anemia - H/H drifting down BMET Pending UA yesterday neg leukocyte, 11/14 UA showed many bacteria - will await culture  Plan: Mobilize with PT CSW DC plan Begin Iron supplements Repeat l;abs in am ?DC Foley    Richrd Prime [id number] 06/03/2011 7:56 AM  As above. 1. Would like to discontinue Foley if okay with nephrology. I will leave a communication for nephrology and enter the order. 2. Discontinue right IJ Swan-Ganz sleeve 3. Wean oxygen. The patient was not on oxygen at home. 4. Continue physical therapy 5. VAC change tomorrow given mildly elevated white blood cell count.  Judeth Cornfield. Scot Dock, [place], [place] [phone] 06/03/2011

## 2011-06-04 LAB — GLUCOSE, CAPILLARY
Glucose-Capillary: 222 mg/dL — ABNORMAL HIGH (ref 70–99)
Glucose-Capillary: 220 mg/dL — ABNORMAL HIGH (ref 70–99)
Glucose-Capillary: 203 mg/dL — ABNORMAL HIGH (ref 70–99)

## 2011-06-04 LAB — CBC
MCV: 85.8 fL (ref 78.0–100.0)
Platelets: 477 10*3/uL — ABNORMAL HIGH (ref 150–400)
RBC: 3.31 MIL/uL — ABNORMAL LOW (ref 3.87–5.11)
WBC: 15.3 10*3/uL — ABNORMAL HIGH (ref 4.0–10.5)

## 2011-06-04 LAB — BASIC METABOLIC PANEL
CO2: 23 mEq/L (ref 19–32)
Chloride: 100 mEq/L (ref 96–112)
Sodium: 140 mEq/L (ref 135–145)

## 2011-06-04 MED ORDER — OXYCODONE-ACETAMINOPHEN 5-325 MG PO TABS
1.0000 | ORAL_TABLET | ORAL | Status: DC | PRN
  Administered 2011-06-07 – 2011-06-08 (×2): 2 via ORAL
  Filled 2011-06-04 (×3): qty 2

## 2011-06-04 MED ORDER — OXYCODONE HCL 5 MG PO TABS
5.0000 mg | ORAL_TABLET | ORAL | Status: DC | PRN
  Administered 2011-06-05 – 2011-06-06 (×2): 5 mg via ORAL
  Filled 2011-06-04 (×2): qty 1

## 2011-06-04 NOTE — Progress Notes (Signed)
Subjective: Interval History:Sitting in chair.  Says she feels better sitting in chair than lying down.  No orthopnea, though  Objective: Vital signs in last 24 hours: Blood pressure 145/64, pulse 63, temperature 98.3 F (36.8 C), temperature source Oral, resp. rate 18, height 5\' 2"  (1.575 m), weight 106.5 kg (234 lb 12.6 oz), SpO2 94.00%.   Intake/Output from previous day: 11/25 0701 - 11/26 0700 In: -  Out: 625 [Urine:625] Intake/Output this shift:    PHYSICAL EXAM General--Awake, alert, friendly,cooperative Chest--clear  Heart--no rub Abd--post op Extr--2 + pretib edema, purplish discoloration R MTP joint (no hx of gout)      No atheroembolic changes of toes  Lab Results:   Lab 06/03/11 0700 06/02/11 0525 05/31/11 0501  NA 138 144 141  K 3.7 3.9 3.8  CL 100 103 103  CO2 25 27 26   BUN 69* 61* 56*  CREATININE 3.78* 3.16* 3.12*  EGFR -- -- --  GLUCOSE 208* -- --  CALCIUM 8.3* 8.6 8.6  PHOS 4.5 4.3 --      Basename 06/03/11 0700  WBC 15.1*  HGB 8.4*  HCT 26.9*  PLT 355    Scheduled:   . amLODipine  10 mg Oral Daily  . calcium-vitamin D  0.5 tablet Oral Daily  . carvedilol  12.5 mg Oral BID WC  . docusate  100 mg Oral Daily  . ferrous sulfate  325 mg Oral BID WC  . furosemide  80 mg Intravenous Q12H  . heparin subcutaneous  5,000 Units Subcutaneous Q8H  . hydrALAZINE  25 mg Oral Q8H  . insulin aspart  0-5 Units Subcutaneous QHS  . insulin aspart  0-9 Units Subcutaneous TID WC  . multivitamin  1 tablet Oral Daily  . pantoprazole  40 mg Oral Q1200  . simvastatin  20 mg Oral Daily  . DISCONTD: aliskiren  300 mg Oral Daily  . DISCONTD: hydrALAZINE  50 mg Oral TID    Assessment/Plan: 1.  Acute kidney injury post op Aorto bifem + endarterectomy of suprarenal  aorta and B renal arteries 2.  Vol overload (weight 110 kg on 16 Nov; weight 106.5 on 22 Nov--none since then.  Still with edema 3.  DM-2 4.  CAD 5.  Anemia 6.  High BP (currently not high) 7.   Abnormal LFTs  PLAN 1.  D/C aliskiren (tekturna) 2.  Daily weights, Lasix 80 Q 12 IV already ordered 3.  Per primary svc 4.  No new suggestions other than PRBC if hgb<7 5.  Fe/TIBC/ferritin 6.  D/c tekturna (see above) 7.  SGOT wnl; SGPT almost wnl (39). Improving   LOS: 10 days   Mickey Esguerra F 06/04/2011,9:28 AM

## 2011-06-04 NOTE — Progress Notes (Signed)
Occupational Therapy Treatment Patient Details Name: Norma [name] MRN: [id number] DOB: June 22, 1946 Today's Date: 06/04/2011  OT Assessment/Plan OT Assessment/Plan OT Plan: Discharge plan remains appropriate OT Frequency: Min 1X/week Follow Up Recommendations: Skilled nursing facility Equipment Recommended: Defer to next venue OT Goals Acute Rehab OT Goals OT Goal Formulation: With patient Time For Goal Achievement: 2 weeks ADL Goals Pt Will Perform Grooming: with set-up;Standing at sink ADL Goal: Grooming - Progress: Progressing toward goals Pt Will Perform Lower Body Bathing: with min assist;Sit to stand from bed ADL Goal: Lower Body Bathing - Progress: Progressing toward goals Pt Will Perform Lower Body Dressing: with min assist;with adaptive equipment;Sit to stand from bed ADL Goal: Lower Body Dressing - Progress: Progressing toward goals Pt Will Transfer to Toilet: with min assist;3-in-1;Stand pivot transfer ADL Goal: Toilet Transfer - Progress: Progressing toward goals Pt Will Perform Toileting - Hygiene: with set-up;Sitting on 3-in-1 or toilet ADL Goal: Toileting - Hygiene - Progress: Progressing toward goals  OT Treatment Precautions/Restrictions  Precautions Precautions: Fall;Other (comment) (VAC dressing) Required Braces or Orthoses: No Restrictions Weight Bearing Restrictions: No   ADL ADL Eating/Feeding: Simulated;Set up Where Assessed - Eating/Feeding: Chair Grooming: Performed;Wash/dry face;Set up Grooming Details (indicate cue type and reason): Pt. completed with use of Rt UE Where Assessed - Grooming: Sitting, chair Upper Body Bathing: Not assessed Lower Body Bathing: Not assessed Upper Body Dressing: Performed;Minimal assistance Upper Body Dressing Details (indicate cue type and reason): With donning gown Where Assessed - Upper Body Dressing: Sitting, bed Lower Body Dressing: Performed;+1 Total assistance Lower Body Dressing Details (indicate cue type  and reason): With donning bilateral socks due to pt. unable to bend forward with incision site. Where Assessed - Lower Body Dressing: Sit to stand from chair Toilet Transfer: Performed;+2 Total assistance;Comment for patient % (pt=60%) Toilet Transfer Details (indicate cue type and reason): Max verbal cues for hand placement on arm rests and for technique. Toilet Transfer Method: Arts development officer: Pharmacist, community - Clothing Manipulation: Performed;Maximal assistance Toileting - Clothing Manipulation Details (indicate cue type and reason): With gown management Where Assessed - Toileting Clothing Manipulation: Not assessed Toileting - Hygiene: Simulated;Maximal assistance Toileting - Hygiene Details (indicate cue type and reason): Pt. unable to twist to reach backside due to incisional site at abdomen Where Assessed - Toileting Hygiene: Sit to stand from 3-in-1 or toilet Tub/Shower Transfer: Not assessed Tub/Shower Transfer Method: Not assessed Equipment Used: Rolling walker ADL Comments: Pt. completed bed to sink and bathroom with total assist +2 pt.=60% Mobility  Bed Mobility Bed Mobility: No Rolling Left: Not tested (comment) Left Sidelying to Sit: Not tested (comment) Transfers Transfers: Yes Sit to Stand: 1: +2 Total assist;Patient percentage (comment);From chair/3-in-1 (pt = 60%) Sit to Stand Details (indicate cue type and reason): vc for hand placement; manual A to w/shift forward Stand to Sit: 1: +2 Total assist;Patient percentage (comment);To chair/3-in-1 (pt = 70%) Stand to Sit Details: vc ro hand placement; and manual A for W/shift forward and A to control descent Exercises    End of Session OT - End of Session Equipment Utilized During Treatment: Gait belt Activity Tolerance: Patient tolerated treatment well Patient left: in chair Nurse Communication: Mobility status for transfers General Behavior During Session: [facility] for tasks  performed Cognition: [facility] for tasks performed  Co-treat with P.T.  Chandlar Staebell,OTR/L Pager [phone]  06/04/2011, 1:00 PM

## 2011-06-04 NOTE — Progress Notes (Addendum)
Patient ID: Norma [name], female   DOB: [birth year], 65 y.o.   MRN: [id number] [facility] [place]  Post-op  OPEN AAA Repair  Date of Surgery: 05/25/2011 Surgeon: Juliann Mule): Clayton Bibles Annamarie Major, MD Mal Misty, MD POD: 10 Days Post-Op  History of Present Illness  Norma [name] is a 65 y.o. female who is  up s/p Procedure(s): AORTA BIFEMORAL BYPASS GRAFT Pt is doing well. denies incisional pain, wound vac in place; denies nausea/vomiting,diarrhea. Beginning to ambulate.    Significant Diagnostic Studies: CBC    Component Value Date/Time   WBC 15.1* 06/03/2011 0700   RBC 3.12* 06/03/2011 0700   HGB 8.4* 06/03/2011 0700   HCT 26.9* 06/03/2011 0700   PLT 355 06/03/2011 0700   MCV 86.2 06/03/2011 0700   MCH 26.9 06/03/2011 0700   MCHC 31.2 06/03/2011 0700   RDW 16.9* 06/03/2011 0700   LYMPHSABS 1.2 05/28/2011 0349   MONOABS 1.2* 05/28/2011 0349   EOSABS 0.1 05/28/2011 0349   BASOSABS 0.0 05/28/2011 0349    BMET    Component Value Date/Time   NA 138 06/03/2011 0700   K 3.7 06/03/2011 0700   CL 100 06/03/2011 0700   CO2 25 06/03/2011 0700   GLUCOSE 208* 06/03/2011 0700   BUN 69* 06/03/2011 0700   CREATININE 3.78* 06/03/2011 0700   CALCIUM 8.3* 06/03/2011 0700   GFRNONAA 12* 06/03/2011 0700   GFRAA 13* 06/03/2011 0700    COAG Lab Results  Component Value Date   INR 1.20 05/28/2011   INR 1.20 05/27/2011   INR 1.27 05/25/2011   No results found for this basename: PTT    I/O last 3 completed shifts: In: -  Out: 625 [Urine:625]    NGT Drainage:No data found.   Physical Examination BP Readings from Last 3 Encounters:  06/04/11 145/64  06/04/11 145/64  05/23/11 184/61   Temp Readings from Last 3 Encounters:  06/04/11 98.3 F (36.8 C) Oral  06/04/11 98.3 F (36.8 C) Oral  05/23/11 98.6 F (37 C) Oral   SpO2 Readings from Last 3 Encounters:  06/04/11 94%  06/04/11 94%  05/23/11 93%    O2 Sats 94 % on Nasal cannula  (liters/minute);  2 LPM  General: A&O x 3, WDWN female in NAD Pulmonary: normal non-labored breathing ,  Cardiac: Heart rate : regular ,  Abdomen:abdomen soft, non-tender and normal active bowel sounds Abdominal wound:clean, dry, intact with incisional vac in place  Neurologic: A&O X 3; Appropriate Affect ; SENSATION: normal; MOTOR FUNCTION:  moving all extremities equally. Speech is fluent/normal  Vascular Exam:BLE warm and well perfused Extremities without ischemic changes, no Gangrene , no cellulitis; no open wounds;  Assessment: Norma [name] is a 65 y.o. female who is 10 Days Post-Op Procedure(s): AORTA BIFEMORAL BYPASS GRAFT U/O 650cc last shift ATN CR up yesterday, pending today Acute Blood loss anemia stable  Plan:  DC foley DC O2 if sats >90% mobilize    Richrd Prime [phone] 06/04/2011 7:51 AM      Pt continues to look betterbut renal fct not improving as fast as I would have thought with only 40-50 minutes of clamp time. Good UO. Suspect Creat will slowly improve. Wound vac to be removed today. Wounds look good. Feet much better than pre-op.  Continue PT and mobilize pt in halls. Check lab today.

## 2011-06-04 NOTE — Progress Notes (Signed)
Utilization review completed. Naasir Carreira, RN, BSN. 06/04/11  

## 2011-06-04 NOTE — Progress Notes (Signed)
Inpatient Diabetes Program Recommendations  AACE/ADA: New Consensus Statement on Inpatient Glycemic Control (2009)  Target Ranges:  Prepandial:   less than 140 mg/dL      Peak postprandial:   less than 180 mg/dL (1-2 hours)      Critically ill patients:  140 - 180 mg/dL   Reason for Visit: Elevated glucose  Inpatient Diabetes Program Recommendations Insulin - Basal: Consider starting with Lantus 15 units daily and titrate to home dose as needed Insulin - Meal Coverage: Add home meal coverage: Novolog 4 units TID

## 2011-06-04 NOTE — Progress Notes (Signed)
   CARE MANAGEMENT NOTE 06/04/2011  Patient:  Norma [name], Norma [name]   Account Number:  [id number]  Date Initiated:  06/04/2011  Documentation initiated by:  Sholom Dulude  Subjective/Objective Assessment:   PT S/P AORTA BIFEM BYPASS GRAFT ON 05/25/11.     Action/Plan:   PT MAKING SLOW PROGRESS.  WILL NEED SKILLED NURSING FACILITY PLACEMENT FOR REHAB.  CSW CONSULTED TO FACILITATE DC TO SNF WHEN MEDICALLY STABLE FOR DC.   Anticipated DC Date:  06/06/2011   Anticipated DC Plan:  SKILLED NURSING FACILITY  In-house referral  Clinical Social Worker         Choice offered to / List presented to:             Status of service:  In process, will continue to follow Medicare Important Message given?   (If response is "NO", the following Medicare IM given date fields will be blank) Date Medicare IM given:   Date Additional Medicare IM given:    Discharge Disposition:    Per UR Regulation:    Comments:

## 2011-06-04 NOTE — Progress Notes (Signed)
CSW met with pt to address SNF. Pt is agreeable to SNF search. Pt shared that she will choose a SNF when she knows the costs of SNF. CSW will complete FL2 and seand a request to [facility]. CSW will also follow up with bed offers with possible co-pays. CSW will continue to follow to facilitate discharge plan.  Darden Dates, MSW, [place]

## 2011-06-04 NOTE — Progress Notes (Signed)
Physical Therapy Treatment Patient Details Name: Norma [name] MRN: [id number] DOB: 30-Jan-1946 Today's Date: 06/04/2011  PT Assessment/Plan  PT - Assessment/Plan Comments on Treatment Session: pt is very self limiting; it is difficult to keep her motivated;  making slow progress PT Plan: Discharge plan remains appropriate Follow Up Recommendations: Skilled nursing facility Equipment Recommended: Defer to next venue PT Goals  Acute Rehab PT Goals PT Goal: Supine/Side to Sit - Progress: Progressing toward goal PT Transfer Goal: Sit to Stand/Stand to Sit - Progress: Progressing toward goal PT Goal: Ambulate - Progress: Progressing toward goal PT Goal: Perform Home Exercise Program - Progress: Other (comment) (not addressed today)  PT Treatment Precautions/Restrictions  Precautions Precautions: Fall;Other (comment) (VAC dressing) Required Braces or Orthoses: No Restrictions Weight Bearing Restrictions: No Mobility (including Balance) Bed Mobility Bed Mobility: No Transfers Transfers: Yes Sit to Stand: 1: +2 Total assist;Patient percentage (comment);From chair/3-in-1 (pt = 60%) Sit to Stand Details (indicate cue type and reason): vc for hand placement; manual A to w/shift forward Stand to Sit: 1: +2 Total assist;Patient percentage (comment);To chair/3-in-1 (pt = 70%) Stand to Sit Details: vc ro hand placement; and manual A for W/shift forward and A to control descent Stand Pivot Transfers: Patient percentage (comment) (pt=60%) Stand Pivot Transfer Details (indicate cue type and reason): vc to use RW correctly, stay off her elbows whie using the RW Ambulation/Gait Ambulation/Gait: Yes Ambulation/Gait Assistance: 1: +2 Total assist;Patient percentage (comment) Ambulation/Gait Assistance Details (indicate cue type and reason): vc to stay off her elbows when using RW Ambulation Distance (Feet): 8 Feet (times 2) Assistive device: Standard walker Gait Pattern: Decreased step length -  right;Decreased step length - left;Trunk flexed;Shuffle (weak gait)  Posture/Postural Control Posture/Postural Control: Postural limitations Postural Limitations: weak, painful back often tending toward a flexed posture Exercise    End of Session PT - End of Session Activity Tolerance: Patient limited by fatigue;Patient limited by pain Patient left: in chair Nurse Communication: Mobility status for ambulation;Mobility status for transfers General Behavior During Session: [facility] for tasks performed Cognition: [facility] for tasks performed  Torrian Canion, Tessie Fass 06/04/2011, 12:40 PM  06/04/2011  Donnella Sham, PT [phone] (pager)

## 2011-06-05 LAB — BASIC METABOLIC PANEL
Calcium: 8.9 mg/dL (ref 8.4–10.5)
GFR calc non Af Amer: 11 mL/min — ABNORMAL LOW (ref >90)
Sodium: 139 mEq/L (ref 135–145)

## 2011-06-05 LAB — GLUCOSE, CAPILLARY
Glucose-Capillary: 187 mg/dL — ABNORMAL HIGH (ref 70–99)
Glucose-Capillary: 179 mg/dL — ABNORMAL HIGH (ref 70–99)
Glucose-Capillary: 184 mg/dL — ABNORMAL HIGH (ref 70–99)

## 2011-06-05 LAB — IRON AND TIBC: Iron: 16 ug/dL — ABNORMAL LOW (ref 42–135)

## 2011-06-05 LAB — FERRITIN: Ferritin: 489 ng/mL — ABNORMAL HIGH (ref 10–291)

## 2011-06-05 NOTE — Progress Notes (Signed)
Subjective: Awake,alert, sitting in chair  Objective: Vital signs in last 24 hours: Blood pressure 147/63, pulse 60, temperature 97.5 F (36.4 C), temperature source Oral, resp. rate 18, height 5\' 2"  (1.575 m), weight 106.5 kg (234 lb 12.6 oz), SpO2 97.00%.   Intake/Output from previous day: 11/26 0701 - 11/27 0700 In: 840 [P.O.:840] Out: 525 [Urine:525] Intake/Output this shift:    PHYSICAL EXAM General--not SOB, alert, friendly Chest--clear Heart--no rub Abd--postop staple line clean, vac in place Extr--2-3+ pretib edema  Lab Results:   Lab 06/05/11 0515 06/04/11 0935 06/03/11 0700 06/02/11 0525  NA 139 140 138 --  K 4.0 3.8 3.7 --  CL 98 100 100 --  CO2 23 23 25  --  BUN 91* 82* 69* --  CREATININE 3.95* 3.94* 3.78* --  EGFR -- -- -- --  GLUCOSE 191* -- -- --  CALCIUM 8.9 8.6 8.3* --  PHOS -- -- 4.5 4.3      Basename 06/04/11 0935 06/03/11 0700  WBC 15.3* 15.1*  HGB 8.9* 8.4*  HCT 28.4* 26.9*  PLT 477* 355    Scheduled:   . amLODipine  10 mg Oral Daily  . calcium-vitamin D  0.5 tablet Oral Daily  . carvedilol  12.5 mg Oral BID WC  . docusate  100 mg Oral Daily  . ferrous sulfate  325 mg Oral BID WC  . furosemide  80 mg Intravenous Q12H  . heparin subcutaneous  5,000 Units Subcutaneous Q8H  . hydrALAZINE  25 mg Oral Q8H  . insulin aspart  0-5 Units Subcutaneous QHS  . insulin aspart  0-9 Units Subcutaneous TID WC  . multivitamin  1 tablet Oral Daily  . pantoprazole  40 mg Oral Q1200  . simvastatin  20 mg Oral Daily  . DISCONTD: aliskiren  300 mg Oral Daily   Continuous:   Assessment/Plan: 1. Acute kidney injury post op Aorto bifem + endarterectomy of suprarenal aorta and B renal arteries  2. Vol overload (weight 110 kg on 16 Nov; weight 106.5 on 22 Nov--none since then. Still with edema  3. DM-2  4. CAD  5. Anemia  6. High BP (currently not high)  7. Abnormal LFTs  PLAN: 1.  Cr appears to be leveling off.  I'm hopeful we'll see lower Cr  over next few days 2.  Still with lots of LE edema.  I'd continue IV lasix until she starts to diurese 3.   Per primary svc 4.  " " 5. Fe studies sent this am 6.  Off tekturna 7 No new suggestions   LOS: 11 days   Azayla Polo F 06/05/2011,9:23 AM

## 2011-06-05 NOTE — Progress Notes (Signed)
CSW met with pt to provide bed offers. Pt will be speaking with her family re: SNF. CSW will continue to follow.   Darden Dates, MSW, [place]

## 2011-06-05 NOTE — Progress Notes (Signed)
PT /OT Cancellation Note     Treatment cancelled today due to patient's adamant refusal to participate after multiple attempts by therapists and family to encourage her---x

## 2011-06-05 NOTE — Progress Notes (Addendum)
Patient ID: Norma [name], female   DOB: 1945/09/13, 65 y.o.   MRN: [id number] [facility]   Post-op  OPEN AAA Repair  Date of Surgery: 05/25/2011 Surgeon: Juliann Mule): Clayton Bibles Annamarie Major, MD Mal Misty, MD POD: 11 Days Post-Op  History of Present Illness  Norma [name] is a 65 y.o. female who is  up s/p Procedure(s): AORTA BIFEMORAL BYPASS GRAFT Pt is doing well. Making slow progress with PT/OT> SNF reccommended  CR stable today  3.95 (3.98 11/26) On 80 of lasix BID - ?change to PO UO 625/24 hours  Significant Diagnostic Studies: CBC    Component Value Date/Time   WBC 15.3* 06/04/2011 0935   RBC 3.31* 06/04/2011 0935   HGB 8.9* 06/04/2011 0935   HCT 28.4* 06/04/2011 0935   PLT 477* 06/04/2011 0935   MCV 85.8 06/04/2011 0935   MCH 26.9 06/04/2011 0935   MCHC 31.3 06/04/2011 0935   RDW 16.8* 06/04/2011 0935   LYMPHSABS 1.2 05/28/2011 0349   MONOABS 1.2* 05/28/2011 0349   EOSABS 0.1 05/28/2011 0349   BASOSABS 0.0 05/28/2011 0349    BMET    Component Value Date/Time   NA 139 06/05/2011 0515   K 4.0 06/05/2011 0515   CL 98 06/05/2011 0515   CO2 23 06/05/2011 0515   GLUCOSE 191* 06/05/2011 0515   BUN 91* 06/05/2011 0515   CREATININE 3.95* 06/05/2011 0515   CALCIUM 8.9 06/05/2011 0515   GFRNONAA 11* 06/05/2011 0515   GFRAA 13* 06/05/2011 0515    I/O last 3 completed shifts: In: 840 [P.O.:840] Out: 825 [Urine:825]    Physical Examination BP Readings from Last 3 Encounters:  06/05/11 147/63  06/05/11 147/63  05/23/11 184/61   Temp Readings from Last 3 Encounters:  06/05/11 97.5 F (36.4 C) Oral  06/05/11 97.5 F (36.4 C) Oral  05/23/11 98.6 F (37 C) Oral   SpO2 Readings from Last 3 Encounters:  06/04/11 97%  06/04/11 97%  05/23/11 93%    General: A&O x 3, WDWN female in NAD Pulmonary: normal non-labored breathing , with Rales at bases, no rhonchi, no  wheezing Cardiac: Heart rate : regular ,  Abdomen:abdomen soft,  non-tender and normal active bowel sounds Abdominal wound:clean, dry, intact  Neurologic: A&O X 3; Appropriate Affect ; SENSATION: normal; MOTOR FUNCTION:  moving all extremities equally. Speech is fluent/normal  Vascular Exam:BLE warm and well perfused Extremities without ischemic changes, no Gangrene , no cellulitis; no open wounds;   Assessment: Norma [name] is a 65 y.o. female who is 11 Days Post-Op Procedure(s): AORTA BIFEMORAL BYPASS GRAFT Slow mobilization ATN CR stable but not improving.- plan per Renal ?change lasix to po Pt taken off texturna yest.-   Plan:  SNF soon DC Foley Change wound vac    Richrd Prime [phone] 06/05/2011 7:59 AM     Agree  With above. Renal fct stable. UO adequate on lasix. SNF soon. DC foley today.  Wounds OK. DC Vac today.

## 2011-06-06 LAB — BASIC METABOLIC PANEL
BUN: 98 mg/dL — ABNORMAL HIGH (ref 6–23)
Calcium: 8.5 mg/dL (ref 8.4–10.5)
Chloride: 98 mEq/L (ref 96–112)
Creatinine, Ser: 3.33 mg/dL — ABNORMAL HIGH (ref 0.50–1.10)
GFR calc Af Amer: 16 mL/min — ABNORMAL LOW (ref >90)
GFR calc non Af Amer: 14 mL/min — ABNORMAL LOW (ref >90)

## 2011-06-06 LAB — GLUCOSE, CAPILLARY
Glucose-Capillary: 195 mg/dL — ABNORMAL HIGH (ref 70–99)
Glucose-Capillary: 202 mg/dL — ABNORMAL HIGH (ref 70–99)

## 2011-06-06 MED ORDER — FERUMOXYTOL INJECTION 510 MG/17 ML
510.0000 mg | Freq: Once | INTRAVENOUS | Status: AC
  Administered 2011-06-06: 510 mg via INTRAVENOUS
  Filled 2011-06-06 (×2): qty 17

## 2011-06-06 NOTE — Plan of Care (Signed)
Problem: Phase II Progression Outcomes Goal: Progress activity as tolerated unless otherwise ordered Outcome: Progressing Slowed progress due to self-limiting behaviors

## 2011-06-06 NOTE — Progress Notes (Signed)
Discussed in the long length of stay meeting Norma [name] Weeks 06/06/2011  

## 2011-06-06 NOTE — Progress Notes (Signed)
Patient ID: Norma [name], female   DOB: [birth year], 65 y.o.   MRN: [id number] V[facility] Surgery Progress NotSubjective: Norma [name] is a 65 y.o. female is 12 Days Post-Op doing better today. Bowels moving. Ambulating in halls with help.UO adequate.  Objective:  Filed Vitals:   06/06/11 0409  BP: 158/63  Pulse: 55  Temp: 97.3 F (36.3 C)  Resp: 19   Wounds look good.VAC removed yesterday. Creat decreasing---3.33 today. Working with PT   Labs: Lab Results  Component Value Date   WBC 15.3* 06/04/2011   HGB 8.9* 06/04/2011   HCT 28.4* 06/04/2011   MCV 85.8 06/04/2011   PLT 477* 06/04/2011      Component Value Date/Time   NA 139 06/06/2011 0509   K 4.0 06/06/2011 0509   CL 98 06/06/2011 0509   CO2 21 06/06/2011 0509   GLUCOSE 196* 06/06/2011 0509   BUN 98* 06/06/2011 0509   CREATININE 3.33* 06/06/2011 0509   CALCIUM 8.5 06/06/2011 0509   GFRNONAA 14* 06/06/2011 0509   GFRAA 16* 06/06/2011 0509   Lab Results  Component Value Date   INR 1.20 05/28/2011   INR 1.20 05/27/2011   INR 1.27 05/25/2011   No results found for this basename: PTT    Imaging: No results found.  Assessment/Plan:   Watch renal fct SNF soon Continue to ambulate.  [name], MD 06/06/2011 8:44 AM

## 2011-06-06 NOTE — Progress Notes (Signed)
Physical Therapy Treatment Patient Details Name: CAPRICA ARNTZ MRN: [id number] DOB: 13-Dec-1945 Today's Date: 06/06/2011  PT Assessment/Plan  PT - Assessment/Plan PT Plan: Discharge plan remains appropriate Follow Up Recommendations: Skilled nursing facility Equipment Recommended: Defer to next venue PT Goals  Acute Rehab PT Goals PT Goal Formulation: With patient PT Goal: Supine/Side to Sit - Progress:  (not addressed today) PT Transfer Goal: Sit to Stand/Stand to Sit - Progress: Progressing toward goal PT Goal: Ambulate - Progress: Progressing toward goal PT Goal: Perform Home Exercise Program - Progress:  (not addressed today)  PT Treatment Precautions/Restrictions  Precautions Precautions: Fall Required Braces or Orthoses: No Restrictions Weight Bearing Restrictions: No Mobility (including Balance) Bed Mobility Bed Mobility: No Transfers Transfers: Yes Sit to Stand: 1: +2 Total assist;Patient percentage (comment);From chair/3-in-1;With upper extremity assist (pt=50-70%) Sit to Stand Details (indicate cue type and reason): vc's for hand placement and standing technique Stand to Sit: 4: Min assist;To chair/3-in-1 Stand to Sit Details: vc's for hand placement Ambulation/Gait Ambulation/Gait: Yes Ambulation/Gait Assistance: 4: Min assist Ambulation/Gait Assistance Details (indicate cue type and reason): vc's to keep her on task, off of worrying about her back pain.  vc's for postural checks; manual A for truink control Ambulation Distance (Feet): 30 Feet (with one standing rest break) Assistive device: Rolling walker Gait Pattern: Decreased step length - right;Decreased step length - left;Shuffle;Trunk flexed  Posture/Postural Control Posture/Postural Control: Postural limitations Postural Limitations: painful back with tendency toward flexion Exercise    End of Session PT - End of Session Activity Tolerance: Patient limited by fatigue;Patient limited by pain Patient  left: in chair Nurse Communication: Mobility status for transfers General Behavior During Session: Other (comment) (self-limiting behavors) Cognition: WFL for tasks performed  Tanis Burnley, Tessie Fass 06/06/2011, 2:19 PM  06/06/2011  Donnella Sham, PT [phone] (pager)

## 2011-06-06 NOTE — Progress Notes (Signed)
Occupational Therapy Treatment Patient Details Name: Norma [name] MRN: [id number] DOB: 09-15-1945 Today's Date: 06/06/2011  OT Assessment/Plan OT Assessment/Plan OT Plan: Discharge plan remains appropriate OT Frequency: Min 1X/week Follow Up Recommendations: Skilled nursing facility Equipment Recommended: Defer to next venue OT Goals Acute Rehab OT Goals OT Goal Formulation: With patient Time For Goal Achievement: 2 weeks ADL Goals Pt Will Perform Grooming: with set-up;Standing at sink ADL Goal: Grooming - Progress: Progressing toward goals Pt Will Perform Lower Body Bathing: with min assist;Sit to stand from bed ADL Goal: Lower Body Bathing - Progress: Not addressed Pt Will Perform Lower Body Dressing: with min assist;with adaptive equipment;Sit to stand from bed ADL Goal: Lower Body Dressing - Progress: Progressing toward goals Pt Will Transfer to Toilet: with min assist;3-in-1;Stand pivot transfer ADL Goal: Toilet Transfer - Progress: Progressing toward goals Pt Will Perform Toileting - Hygiene: with set-up;Sitting on 3-in-1 or toilet ADL Goal: Toileting - Hygiene - Progress: Progressing toward goals  OT Treatment Precautions/Restrictions  Precautions Precautions: Fall Required Braces or Orthoses: No   ADL ADL Grooming: Simulated;Wash/dry hands;Set up;Minimal assistance Where Assessed - Grooming: Standing at sink Upper Body Bathing: Not assessed Lower Body Bathing: Not assessed Lower Body Dressing: Performed;+1 Total assistance Lower Body Dressing Details (indicate cue type and reason): With donning bilateral socks due to pt. unable to bend forward with incision site. Where Assessed - Lower Body Dressing: Sit to stand from chair Toilet Transfer: Performed;+2 Total assistance;Comment for patient % (pt=60%) Toilet Transfer Details (indicate cue type and reason): Max verbal cues for hand placement on arm rests and for technique. Toilet Transfer Method: Actuary: Pharmacist, community - Clothing Manipulation: Performed;Maximal assistance Toileting - Clothing Manipulation Details (indicate cue type and reason): With gown management Where Assessed - Toileting Clothing Manipulation: Sit to stand from 3-in-1 or toilet Toileting - Hygiene: Simulated;Maximal assistance Toileting - Hygiene Details (indicate cue type and reason): Pt. unable to twist to reach backside due to incisional site at abdomen Where Assessed - [place]: Sit to stand from 3-in-1 or toilet Tub/Shower Transfer: Not assessed Tub/Shower Transfer Method: Not assessed Equipment Used: Rolling walker ADL Comments: Pt. with urgency to use bedside commode upon standing from chair and commode placed behind pt. Increased time required due to RN present to give pain medication to pt. due to low back pain. Pt.  completed ~40' with RW and min assist with max cues for encouragment due to pt. with very self limiting behaviors.   Mobility  Bed Mobility Bed Mobility: No Transfers Transfers: Yes Sit to Stand: 1: +2 Total assist;Patient percentage (comment);From chair/3-in-1;With upper extremity assist (pt=50-70%) Sit to Stand Details (indicate cue type and reason): vc's for hand placement and standing technique Stand to Sit: 4: Min assist;To chair/3-in-1 Stand to Sit Details: vc's for hand placement     End of Session OT - End of Session Equipment Utilized During Treatment: Gait belt Activity Tolerance: Patient tolerated treatment well Patient left: in chair Nurse Communication: Mobility status for transfers General Behavior During Session: Other (comment) (self-limiting behavors) Cognition: WFL for tasks performed  Co-treat with Adonis Huguenin, OTR/L Pager [phone]  06/06/2011, 2:59 PM

## 2011-06-06 NOTE — Progress Notes (Signed)
Subjective: Awake, alert,sitting in chair  Objective: Vital signs in last 24 hours: Blood pressure 158/63, pulse 55, temperature 97.3 F (36.3 C), temperature source Oral, resp. rate 19, height 5\' 2"  (1.575 m), weight 107.3 kg (236 lb 8.9 oz), SpO2 97.00%.   Intake/Output from previous day: 11/27 0701 - 11/28 0700 In: 840 [P.O.:840] Out: 201 [Urine:200; Stool:1] Intake/Output this shift: Total I/O In: -  Out: 250 [Urine:250]  PHYSICAL EXAM General--sitting comfortably in chsir Chest--clear  Heart--no rub Abd--post op.  Vac removed Extr--2+ pretib edema  Lab Results:   Lab 06/06/11 0509 06/05/11 0515 06/04/11 0935 06/03/11 0700 06/02/11 0525  NA 139 139 140 -- --  K 4.0 4.0 3.8 -- --  CL 98 98 100 -- --  CO2 21 23 23  -- --  BUN 98* 91* 82* -- --  CREATININE 3.33* 3.95* 3.94* -- --  EGFR -- -- -- -- --  GLUCOSE 196* -- -- -- --  CALCIUM 8.5 8.9 8.6 -- --  PHOS -- -- -- 4.5 4.3      Basename 06/04/11 0935  WBC 15.3*  HGB 8.9*  HCT 28.4*  PLT 477*   Fe/TIBC 9% sat, ferritin 489 (indicates Fe def)  Scheduled: Continuous:   Assessment/Plan: 1. Acute kidney injury post op Aorto bifem + endarterectomy of suprarenal aorta and B renal arteries  2. Vol overload (weight 110 kg on 16 Nov; weight 106.5 on 22 Nov--Wt today 107.3 . Still with edema  3. DM-2  4. CAD  5. Anemia  6. High BP (currently not high)  7. Abnormal LFTs   PLAN:  1. Cr finally starting to fall. I'm hopeful we'll see lower Cr over next few days  2. Still with lots of LE edema. I'd continue IV lasix until she starts to diurese on her own 3. Per primary svc  4. " "  5. Fe studies indicate Fe Def. Will RX IV Fe and aranesp  6. Off tekturna  7  Check LFTs in AM     LOS: 12 days   Dima Ferrufino F 06/06/2011,9:20 AM

## 2011-06-06 NOTE — Progress Notes (Signed)
CSW met with pt and her daughter to discuss discharge plan. CSW provided additional bed offers. CSW also encourage pt and daughter to choose a facility, as pt is getting close to discharge. CSW will meet with pt and daughter tomorrow afternoon for further discussion re: discharge to SNF. CSW will continue to follow.   Darden Dates, MSW, [place]

## 2011-06-06 NOTE — Consult Note (Signed)
Wound care follow-up:  Incisional vac has been d/ced by [facility] service.  They are now following for assessment and plan of care.  Will not plan to follow further unless re-consulted.  [address] [place], MSN, [place]

## 2011-06-07 LAB — CBC
HCT: 30.4 % — ABNORMAL LOW (ref 36.0–46.0)
MCV: 84.7 fL (ref 78.0–100.0)
RDW: 16.5 % — ABNORMAL HIGH (ref 11.5–15.5)
WBC: 11.2 10*3/uL — ABNORMAL HIGH (ref 4.0–10.5)

## 2011-06-07 LAB — GLUCOSE, CAPILLARY
Glucose-Capillary: 195 mg/dL — ABNORMAL HIGH (ref 70–99)
Glucose-Capillary: 192 mg/dL — ABNORMAL HIGH (ref 70–99)

## 2011-06-07 LAB — COMPREHENSIVE METABOLIC PANEL
ALT: 16 U/L (ref 0–35)
AST: 27 U/L (ref 0–37)
Albumin: 2.2 g/dL — ABNORMAL LOW (ref 3.5–5.2)
Alkaline Phosphatase: 92 U/L (ref 39–117)
Potassium: 3.6 mEq/L (ref 3.5–5.1)
Sodium: 143 mEq/L (ref 135–145)
Total Protein: 6.6 g/dL (ref 6.0–8.3)

## 2011-06-07 MED ORDER — FUROSEMIDE 10 MG/ML IJ SOLN
160.0000 mg | Freq: Two times a day (BID) | INTRAVENOUS | Status: DC
  Administered 2011-06-07 – 2011-06-08 (×2): 160 mg via INTRAVENOUS
  Filled 2011-06-07 (×3): qty 16

## 2011-06-07 MED ORDER — FUROSEMIDE 10 MG/ML IJ SOLN
80.0000 mg | Freq: Once | INTRAMUSCULAR | Status: AC
  Administered 2011-06-07: 80 mg via INTRAVENOUS

## 2011-06-07 NOTE — Progress Notes (Addendum)
Patient ID: Norma [name], female   DOB: Jun 01, 1946, 65 y.o.   MRN: [id number] [facility] [place]  Post-op  OPEN AAA Repair  Date of Surgery: 05/25/2011 Surgeon: Juliann Mule): Clayton Bibles Annamarie Major, MD Mal Misty, MD POD: 13 Days Post-Op  History of Present Illness  Norma [name] is a 65 y.o. female who is  up s/p Procedure(s): AORTA BIFEMORAL BYPASS GRAFT Pt is doing well. complains of incisional pain; Beginning to ambulate Has severe edema BLE R>L No hx Gout  Significant Diagnostic Studies: CBC    Component Value Date/Time   WBC 11.2* 06/07/2011 0555   RBC 3.59* 06/07/2011 0555   HGB 9.6* 06/07/2011 0555   HCT 30.4* 06/07/2011 0555   PLT 654* 06/07/2011 0555   MCV 84.7 06/07/2011 0555   MCH 26.7 06/07/2011 0555   MCHC 31.6 06/07/2011 0555   RDW 16.5* 06/07/2011 0555   LYMPHSABS 1.2 05/28/2011 0349   MONOABS 1.2* 05/28/2011 0349   EOSABS 0.1 05/28/2011 0349   BASOSABS 0.0 05/28/2011 0349   BMET Pending  I/O last 3 completed shifts: In: 1160 [P.O.:1160] Out: 1001 [Urine:1000; Stool:1]    NGT Drainage:No data found.   Physical Examination BP Readings from Last 3 Encounters:  06/07/11 159/50  06/07/11 159/50  05/23/11 184/61   Temp Readings from Last 3 Encounters:  06/07/11 97.7 F (36.5 C) Oral  06/07/11 97.7 F (36.5 C) Oral  05/23/11 98.6 F (37 C) Oral   SpO2 Readings from Last 3 Encounters:  06/07/11 90%  06/07/11 90%  05/23/11 93%    O2 Sats 93 % on RA  General: A&O x 3, WDWN female in NAD Pulmonary: normal non-labored breathing , without Rales, rhonchi,  wheezing Cardiac: Heart rate : regular ,  Abdomen:abdomen soft, non-tender and normal active bowel sounds Abdominal wound:clean, dry, intact  Neurologic: A&O X 3; Appropriate Affect ; SENSATION: normal; MOTOR FUNCTION:  moving all extremities equally. Speech is fluent/normal  Vascular Exam:BLE warm and well perfused. 3+ pitting edema R>L Extremities without ischemic  changes, no Gangrene , no cellulitis; no open wounds;   LOWER EXTREMITY PULSES           RIGHT                                      LEFT      POSTERIOR TIBIAL absent doppler       DORSALIS PEDIS      ANTERIOR TIBIAL doppler doppler  Assessment: Norma [name] is a 65 y.o. female who is 13 Days Post-Op Procedure(s): AORTA BIFEMORAL BYPASS GRAFT - doing well ATN _ CR improved yesterday, good U/O BMET today pending  Plan:  Continue PT Cont lasix for edema    Richrd Prime [phone] 06/07/2011 8:46 AM      Creat much better today----2.33----Good UO Chest   Clear Wounds look good Hopefully we can DC pt to SNF Friday if OK with renal Continues to do well

## 2011-06-07 NOTE — Progress Notes (Signed)
Subjective: Awake, alert, wants to go home. Eating bkfst and talking on phone  Objective: Vital signs in last 24 hours: Blood pressure 159/50, pulse 64, temperature 97.7 F (36.5 C), temperature source Oral, resp. rate 18, height 5\' 2"  (1.575 m), weight 103.2 kg (227 lb 8.2 oz), SpO2 90.00%.   Intake/Output from previous day: 11/28 0701 - 11/29 0700 In: 800 [P.O.:800] Out: 801 [Urine:800; Stool:1] Intake/Output this shift:    PHYSICAL EXAM General--alert, friendly Chest--clear  Heart--no rub Abd--post op Extr--2+ edema persists   Lab Results:   Lab 06/07/11 0555 06/06/11 0509 06/05/11 0515 06/04/11 0935 06/03/11 0700 06/02/11 0525  NA -- 139 139 140 -- --  K -- 4.0 4.0 3.8 -- --  CL -- 98 98 100 -- --  CO2 -- 21 23 23  -- --  BUN -- 98* 91* 82* -- --  CREATININE -- 3.33* 3.95* 3.94* -- --  EGFR -- -- -- -- -- --  GLUCOSE -- 196* -- -- -- --  CALCIUM -- 8.5 8.9 8.6 -- --  PHOS 3.8 -- -- -- 4.5 4.3      Basename 06/07/11 0555 06/04/11 0935  WBC 11.2* 15.3*  HGB 9.6* 8.9*  HCT 30.4* 28.4*  PLT 654* 477*    Scheduled: Continuous:   Assessment/Plan 1. Acute kidney injury post op Aorto bifem + endarterectomy of suprarenal aorta and B renal arteries  2. Vol overload (weight 110 kg on 16 Nov; weight 106.5 on 22 Nov--Wt 28 Nov  107.3, today 103 kg!. Still with edema  3. DM-2  4. CAD  5. Anemia  6. High BP (currently not high)  7. Abnormal LFTs  PLAN:  1. Cr finally starting to fall. I'm hopeful we'll see lower Cr over next few days.  Lab not back yet today 2. Still with lots of LE edema. I'd continue IV lasix until she starts to diurese on her own .  Have increased lasix to 160 BID 3. Per primary svc  4. " "  5. On IV Fe and aranesp  6. Off tekturna  7  LFTs this AM    LOS: 13 days   Cashe Gatt F 06/07/2011,9:05 AM

## 2011-06-07 NOTE — Progress Notes (Signed)
CSW met with pt and her daughter regarding the anticipated discharge for tomorrow. Pt's daughter has chosen [facility]. CSW confirmed this bed offer with facility. Pt's daughter will tour facility this evening to ease her mother's concerns of going to a SNF. Pt and daughter will give their final decsion tomorrow. CSW will continue to follow.   Darden Dates, MSW, [place]

## 2011-06-07 NOTE — Progress Notes (Signed)
Utilization review completed. Rozanna Boer, RN, BSN. 06/07/11

## 2011-06-08 LAB — BASIC METABOLIC PANEL
Chloride: 103 mEq/L (ref 96–112)
GFR calc Af Amer: 32 mL/min — ABNORMAL LOW (ref >90)
Potassium: 3.6 mEq/L (ref 3.5–5.1)

## 2011-06-08 LAB — GLUCOSE, CAPILLARY

## 2011-06-08 MED ORDER — FERROUS SULFATE 325 (65 FE) MG PO TABS: 325.0000 mg | ORAL_TABLET | Freq: Two times a day (BID) | ORAL | Status: DC

## 2011-06-08 MED ORDER — PANTOPRAZOLE SODIUM 40 MG PO TBEC: 40.0000 mg | DELAYED_RELEASE_TABLET | Freq: Every day | ORAL | Status: DC

## 2011-06-08 MED ORDER — POLYETHYLENE GLYCOL 3350 17 G PO PACK: 17.0000 g | PACK | Freq: Every day | ORAL | Status: DC | PRN

## 2011-06-08 MED ORDER — DOCUSATE SODIUM 50 MG/5ML PO LIQD: 100.0000 mg | Freq: Every day | ORAL | Status: DC

## 2011-06-08 MED ORDER — OXYCODONE-ACETAMINOPHEN 5-325 MG PO TABS: 1.0000 | ORAL_TABLET | ORAL | Status: DC | PRN

## 2011-06-08 MED ORDER — ACETAMINOPHEN 325 MG PO TABS: 325.0000 mg | ORAL_TABLET | ORAL | Status: DC | PRN

## 2011-06-08 MED ORDER — CARVEDILOL 6.25 MG PO TABS: 18.7500 mg | ORAL_TABLET | Freq: Two times a day (BID) | ORAL | Status: DC

## 2011-06-08 MED ORDER — DARBEPOETIN ALFA-POLYSORBATE 100 MCG/0.5ML IJ SOLN
100.0000 ug | INTRAMUSCULAR | Status: DC
  Filled 2011-06-08: qty 0.5

## 2011-06-08 MED ORDER — FUROSEMIDE 80 MG PO TABS: 80.0000 mg | ORAL_TABLET | Freq: Two times a day (BID) | ORAL | Status: DC

## 2011-06-08 MED ORDER — INSULIN ASPART 100 UNIT/ML ~~LOC~~ SOLN
0.0000 [IU] | Freq: Three times a day (TID) | SUBCUTANEOUS | Status: DC
Start: 2011-06-08 — End: 2011-06-12

## 2011-06-08 MED ORDER — CARVEDILOL 12.5 MG PO TABS
18.7500 mg | ORAL_TABLET | Freq: Two times a day (BID) | ORAL | Status: DC
  Filled 2011-06-08 (×2): qty 1

## 2011-06-08 NOTE — Progress Notes (Signed)
Subjective: Awake,just got pain meds.  Understands she will go to SNF. Says Dr. Heath Gold is her primary care doc  Objective: Vital signs in last 24 hours: Blood pressure 180/67, pulse 67, temperature 97.9 F (36.6 C), temperature source Oral, resp. rate 19, height 5\' 2"  (1.575 m), weight 103.2 kg (227 lb 8.2 oz), SpO2 93.00%.   Intake/Output from previous day: 11/29 0701 - 11/30 0700 In: 1250 [P.O.:1200; IV Piggyback:50] Out: 1400 [Urine:1400] Intake/Output this shift:   Wt Readings from Last 3 Encounters:  06/07/11 103.2 kg (227 lb 8.2 oz)  06/07/11 103.2 kg (227 lb 8.2 oz)  05/23/11 109.77 kg (242 lb)     PHYSICAL EXAM General--Awake, alert Chest--clear Heart--no rub Abd--post op Extr--edema less, but still significant  Lab Results:   Lab 06/08/11 0500 06/07/11 0555 06/06/11 0509 06/03/11 0700 06/02/11 0525  NA 146* 143 139 -- --  K 3.6 3.6 4.0 -- --  CL 103 99 98 -- --  CO2 24 21 21  -- --  BUN 92* 98* 98* -- --  CREATININE 1.85* 2.44* 3.33* -- --  EGFR -- -- -- -- --  GLUCOSE 194* -- -- -- --  CALCIUM 8.8 8.8 8.5 -- --  PHOS -- 3.8 -- 4.5 4.3      Basename 06/07/11 0555  WBC 11.2*  HGB 9.6*  HCT 30.4*  PLT 654*    Scheduled: Continuous:   Assessment/Plan: 1. Acute kidney injury post op Aorto bifem + endarterectomy of suprarenal aorta and B renal arteries--improving 2. Vol overload (weight 110 kg on 16 Nov; weight 106.5 on 22 Nov--Wt 28 Nov 107.3, 29 Nov 103 kg!. Still with edema .  No wt today 3. DM-2  4. CAD  5. Anemia  6. High BP  7. Abnormal LFTs  PLAN:  1. Cr finally starting to fall. I'm hopeful we'll see lower Cr over next few days. Lab not back yet today  2. Still with lots of LE edema. I'd continue IV lasix until she starts to diurese on her own . Have increased lasix to 160 BID  3. Per primary svc  4. " "  5. On IV Fe and aranesp  100 mcg subq today  6. Off tekturna. BP still high.  Will increase Carvedilol to 18.75 BID + continue  diuresis,  Watch for bradycardia 7 LFTs normal now    LOS: 14 days   Norma [name] F 06/08/2011,10:08 AM

## 2011-06-08 NOTE — Progress Notes (Signed)
Pt is ready for discharge today to [facility]. Facility is ready to accept pt and has received discharge paperwork. Pt and family are agreeable to discharge plan. [facility] will be providing transportation. CSW signing off, as no further clinical social work needs identified.   Darden Dates, MSW, [place]

## 2011-06-08 NOTE — Discharge Summary (Signed)
[facility] Discharge Summary   Patient ID:  Norma [name] MRN: [id number] DOB/AGE: [birth year] 65 y.o.  Admit date: 05/25/2011 Discharge date: 06/08/2011 Date of Surgery: 05/25/2011 Surgeon: Surgeon(s): Clayton Bibles Annamarie Major, MD Mal Misty, MD  Admission Diagnosis: PVD  Discharge Diagnoses:  PVD  Secondary Diagnoses: Past Medical History  Diagnosis Date  . Claudication   . Diabetes mellitus   . Hypertension   . Hyperlipidemia   . DJD (degenerative joint disease)   . PAD (peripheral artery disease)   . Coronary artery disease   . Leg pain   . Carotid artery occlusion   . Obesity   . DDD (degenerative disc disease)   . DJD (degenerative joint disease)   . Shortness of breath     exertion  . Anxiety     Procedures: Procedure(s): AORTA BIFEMORAL BYPASS GRAFT  Discharged Condition: good  HPI:  HPI this 65 year old female returns today to discuss revascularization of her lower extremities. 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 This does not involve flow to the celiac axis or superior mesenteric artery but is infringing on the flow to both renal arteries. She had a CT angiogram performed last week which I have reviewed today. It appears that the plaque in the supra-renal aorta has progressed since May of 2011 when she had her angiogram performed. Claudication symptoms are quite severe limiting her walking one quarter of a block. She tolerated her cardiac surgery well last year and has been followed by Dr. Peter Martinique. She is admitted for Aortobifem bypass.   Hospital Course:  Norma [name] is a 65 y.o. female is S/P  AORTA BIFEMORAL BYPASS GRAFT Extubated: POD # 1 Post-op wounds healing well/  pt had 5 days of incisional vac with good results Bilat. Groin wounds healing well Pt. Ambulating, voiding and taking PO diet without difficulty. Pt pain controlled with PO pain meds. Labs as below Complications:ATN followed by renal sx - resolving off Ace inhibitor and with Lasix Post -op acute blood loss anemia- stable on iron supplements Deconditioned - PT/OT  Consults:  Treatment Team:  Estanislado Emms, MD  Significant Diagnostic Studies: CBC    Component Value Date/Time   WBC 11.2* 06/07/2011 0555   RBC 3.59* 06/07/2011 0555   HGB 9.6* 06/07/2011 0555   HCT 30.4* 06/07/2011 0555   PLT 654* 06/07/2011 0555   MCV 84.7 06/07/2011 0555   MCH 26.7 06/07/2011 0555   MCHC 31.6 06/07/2011 0555   RDW 16.5* 06/07/2011 0555   LYMPHSABS 1.2 05/28/2011 0349   MONOABS 1.2* 05/28/2011 0349   EOSABS 0.1 05/28/2011 0349   BASOSABS 0.0 05/28/2011 0349    BMET    Component Value Date/Time   NA 146* 06/08/2011 0500   K 3.6 06/08/2011 0500   CL 103 06/08/2011 0500   CO2 24 06/08/2011 0500   GLUCOSE 194* 06/08/2011 0500   BUN 92* 06/08/2011 0500   CREATININE 1.85* 06/08/2011 0500   CALCIUM 8.8 06/08/2011 0500   GFRNONAA 27* 06/08/2011 0500   GFRAA 32* 06/08/2011 0500    COAG Lab Results  Component Value Date   INR 1.20 05/28/2011   INR 1.20 05/27/2011   INR 1.27 05/25/2011     Disposition:  Discharge to :Skilled nursing facility Follow up Dr. Kellie Simmering 06/18/11 at 9 am   Discharge Orders    Future Appointments: Provider: Department: Dept Phone: Center:   06/28/2011 11:00 AM Peter Martinique, MD [facility] [phone] None   10/16/2011 3:30 PM [facility] [phone] [facility]   10/16/2011 4:00 PM Mal Misty, MD [facility] [phone] [facility]     Future Orders Please Complete By Expires   Resume previous diet      Driving Restrictions      Comments:   No driving   Lifting restrictions      Comments:   No lifting   Call MD for:  temperature >100.5       Call MD for:  redness, tenderness, or signs of infection (pain, swelling, bleeding, redness, odor or green/yellow discharge around incision site)      Call MD for:  severe or increased pain, loss or decreased feeling  in affected limb(s)      Increase activity slowly      Comments:   Walk with assistance use walker or cane as needed   Walker       May shower       may wash over wound with mild soap and water      Discharge wound care:      Comments:   Paint all wounds with betadine 2 x per day   ABDOMINAL PROCEDURE/ANEURYSM REPAIR/AORTO-BIFEMORAL BYPASS:  Call MD for increased abdominal pain; cramping diarrhea; nausea/vomiting         Norma [name], Norma [name]  Home Medication Instructions [id number]   Printed on:06/08/11 0923  Medication Information                    insulin glargine (LANTUS) 100 UNIT/ML injection Inject 36 Units into the skin 2 (two) times daily.             insulin aspart (NOVOLOG) 100 UNIT/ML injection Inject 4 Units into the skin 3 (three) times daily before meals.             atorvastatin (LIPITOR) 20 MG tablet Take 20 mg by mouth daily.             carvedilol  Disp Refills Start End  carvedilol (COREG) tablet 18.75 mg BID             glimepiride (AMARYL) 4 MG tablet Take 4 mg by mouth daily before breakfast.             Multiple Vitamin (MULTIVITAMIN) tablet Take 1 tablet by mouth daily.             aspirin 325 MG tablet Take 325 mg by mouth daily.             Calcium Carbonate-Vitamin D (CALCIUM + D PO) Take by mouth daily.             fish oil-omega-3 fatty acids 1000 MG capsule Take 1 g by mouth 2 (two) times daily.             metFORMIN (GLUCOPHAGE) 1000 MG tablet Take 1,000 mg by mouth 2 (two) times daily with a meal.             amLODipine (NORVASC) 10 MG tablet Take 10 mg by mouth daily.             hydrALAZINE (APRESOLINE) 25 MG tablet Take 2 tablets (50 mg total) by mouth 3 (three) times daily.  Coenzyme Q10 200 MG  capsule Take 200 mg by mouth daily.             acetaminophen (TYLENOL) 325 MG tablet Take 1-2 tablets (325-650 mg total) by mouth every 4 (four) hours as needed (or temp >/= 101 F).           docusate (COLACE) 50 MG/5ML liquid Take 10 mLs (100 mg total) by mouth daily.           insulin aspart (NOVOLOG) 100 UNIT/ML injection Inject 0-9 Units into the skin 3 (three) times daily with meals.           oxyCODONE-acetaminophen (PERCOCET) 5-325 MG per tablet Take 1-2 tablets by mouth every 4 (four) hours as needed.           pantoprazole (PROTONIX) 40 MG tablet Take 1 tablet (40 mg total) by mouth daily at 12 noon.           polyethylene glycol (MIRALAX / GLYCOLAX) packet Take 17 g by mouth daily as needed.           ferrous sulfate 325 (65 FE) MG tablet Take 1 tablet (325 mg total) by mouth 2 (two) times daily with a meal.           Lasix 80 mg BID ORAL   Verbal and written Discharge instructions given to the patient. Wound care per Discharge AVS Follow-up Information    Follow up with LAWSON,JAMES D, MD in 2 weeks. (office will arrange - called)    Contact information:   [address] [place] [phone]          Signed: Richrd Prime 06/08/2011, 9:23 AM  Addendum: CBC and BMET on 12 /7 /12 ;  fax results to [facility] office [phone]

## 2011-06-12 ENCOUNTER — Encounter (HOSPITAL_COMMUNITY): Payer: Self-pay | Admitting: Emergency Medicine

## 2011-06-12 ENCOUNTER — Observation Stay (HOSPITAL_COMMUNITY)
Admission: EM | Admit: 2011-06-12 | Discharge: 2011-06-14 | Disposition: A | Discharge status: Skilled Nursing Facility | Payer: Medicare Other | Attending: Internal Medicine | Admitting: Internal Medicine

## 2011-06-12 DIAGNOSIS — I10 Essential (primary) hypertension: Secondary | ICD-10-CM | POA: Diagnosis present

## 2011-06-12 DIAGNOSIS — E669 Obesity, unspecified: Secondary | ICD-10-CM | POA: Insufficient documentation

## 2011-06-12 DIAGNOSIS — Z794 Long term (current) use of insulin: Secondary | ICD-10-CM | POA: Insufficient documentation

## 2011-06-12 DIAGNOSIS — I129 Hypertensive chronic kidney disease with stage 1 through stage 4 chronic kidney disease, or unspecified chronic kidney disease: Secondary | ICD-10-CM | POA: Insufficient documentation

## 2011-06-12 DIAGNOSIS — I2510 Atherosclerotic heart disease of native coronary artery without angina pectoris: Secondary | ICD-10-CM | POA: Insufficient documentation

## 2011-06-12 DIAGNOSIS — E876 Hypokalemia: Secondary | ICD-10-CM | POA: Insufficient documentation

## 2011-06-12 DIAGNOSIS — E162 Hypoglycemia, unspecified: Secondary | ICD-10-CM

## 2011-06-12 DIAGNOSIS — D72829 Elevated white blood cell count, unspecified: Secondary | ICD-10-CM | POA: Insufficient documentation

## 2011-06-12 DIAGNOSIS — M1990 Unspecified osteoarthritis, unspecified site: Secondary | ICD-10-CM | POA: Insufficient documentation

## 2011-06-12 DIAGNOSIS — I739 Peripheral vascular disease, unspecified: Secondary | ICD-10-CM | POA: Insufficient documentation

## 2011-06-12 DIAGNOSIS — N189 Chronic kidney disease, unspecified: Secondary | ICD-10-CM | POA: Insufficient documentation

## 2011-06-12 DIAGNOSIS — E1169 Type 2 diabetes mellitus with other specified complication: Principal | ICD-10-CM | POA: Insufficient documentation

## 2011-06-12 DIAGNOSIS — R4182 Altered mental status, unspecified: Secondary | ICD-10-CM | POA: Insufficient documentation

## 2011-06-12 DIAGNOSIS — E785 Hyperlipidemia, unspecified: Secondary | ICD-10-CM | POA: Insufficient documentation

## 2011-06-12 DIAGNOSIS — E870 Hyperosmolality and hypernatremia: Secondary | ICD-10-CM | POA: Diagnosis present

## 2011-06-12 DIAGNOSIS — D649 Anemia, unspecified: Secondary | ICD-10-CM | POA: Insufficient documentation

## 2011-06-12 LAB — GLUCOSE, CAPILLARY
Glucose-Capillary: 102 mg/dL — ABNORMAL HIGH (ref 70–99)
Glucose-Capillary: 104 mg/dL — ABNORMAL HIGH (ref 70–99)
Glucose-Capillary: 115 mg/dL — ABNORMAL HIGH (ref 70–99)
Glucose-Capillary: 117 mg/dL — ABNORMAL HIGH (ref 70–99)
Glucose-Capillary: 32 mg/dL — CL (ref 70–99)
Glucose-Capillary: 36 mg/dL — CL (ref 70–99)
Glucose-Capillary: 61 mg/dL — ABNORMAL LOW (ref 70–99)
Glucose-Capillary: 92 mg/dL (ref 70–99)

## 2011-06-12 LAB — COMPREHENSIVE METABOLIC PANEL
Alkaline Phosphatase: 85 U/L (ref 39–117)
BUN: 47 mg/dL — ABNORMAL HIGH (ref 6–23)
Chloride: 107 mEq/L (ref 96–112)
GFR calc Af Amer: 47 mL/min — ABNORMAL LOW (ref >90)
GFR calc non Af Amer: 41 mL/min — ABNORMAL LOW (ref >90)
Glucose, Bld: 96 mg/dL (ref 70–99)
Potassium: 2.9 mEq/L — ABNORMAL LOW (ref 3.5–5.1)
Total Bilirubin: 0.5 mg/dL (ref 0.3–1.2)
Total Protein: 6.2 g/dL (ref 6.0–8.3)

## 2011-06-12 LAB — CBC
Hemoglobin: 9.7 g/dL — ABNORMAL LOW (ref 12.0–15.0)
MCH: 26.9 pg (ref 26.0–34.0)
MCH: 26.5 pg (ref 26.0–34.0)
MCHC: 30.8 g/dL (ref 30.0–36.0)
MCV: 85.9 fL (ref 78.0–100.0)
Platelets: 532 10*3/uL — ABNORMAL HIGH (ref 150–400)
RBC: 3.6 MIL/uL — ABNORMAL LOW (ref 3.87–5.11)

## 2011-06-12 LAB — DIFFERENTIAL
Eosinophils Absolute: 0 10*3/uL (ref 0.0–0.7)
Lymphs Abs: 1.2 10*3/uL (ref 0.7–4.0)
Monocytes Relative: 8 % (ref 3–12)
Neutrophils Relative %: 83 % — ABNORMAL HIGH (ref 43–77)

## 2011-06-12 LAB — CREATININE, SERUM: Creatinine, Ser: 1.22 mg/dL — ABNORMAL HIGH (ref 0.50–1.10)

## 2011-06-12 MED ORDER — ACETAMINOPHEN 325 MG PO TABS: 325.0000 mg | ORAL_TABLET | ORAL | Status: DC | PRN

## 2011-06-12 MED ORDER — DEXTROSE 10 % IV SOLN
INTRAVENOUS | Status: DC
  Administered 2011-06-12 – 2011-06-13 (×2): via INTRAVENOUS

## 2011-06-12 MED ORDER — POTASSIUM CHLORIDE CRYS ER 20 MEQ PO TBCR
40.0000 meq | EXTENDED_RELEASE_TABLET | Freq: Once | ORAL | Status: AC
  Administered 2011-06-12: 40 meq via ORAL

## 2011-06-12 MED ORDER — OXYCODONE-ACETAMINOPHEN 5-325 MG PO TABS
1.0000 | ORAL_TABLET | ORAL | Status: DC | PRN
  Filled 2011-06-12: qty 1

## 2011-06-12 MED ORDER — DEXTROSE 50 % IV SOLN
INTRAVENOUS | Status: AC
  Administered 2011-06-12: 50 mL
  Filled 2011-06-12: qty 50

## 2011-06-12 MED ORDER — DEXTROSE 50 % IV SOLN
INTRAVENOUS | Status: AC
  Administered 2011-06-12: 03:00:00
  Filled 2011-06-12: qty 50

## 2011-06-12 MED ORDER — PANTOPRAZOLE SODIUM 40 MG PO TBEC
40.0000 mg | DELAYED_RELEASE_TABLET | Freq: Every day | ORAL | Status: DC
  Administered 2011-06-12 – 2011-06-14 (×3): 40 mg via ORAL
  Filled 2011-06-12 (×3): qty 1

## 2011-06-12 MED ORDER — HYDRALAZINE HCL 50 MG PO TABS
50.0000 mg | ORAL_TABLET | Freq: Three times a day (TID) | ORAL | Status: DC
  Administered 2011-06-12 – 2011-06-14 (×7): 50 mg via ORAL
  Filled 2011-06-12 (×10): qty 1

## 2011-06-12 MED ORDER — ASPIRIN 325 MG PO TABS
325.0000 mg | ORAL_TABLET | Freq: Every day | ORAL | Status: DC
  Administered 2011-06-12 – 2011-06-14 (×3): 325 mg via ORAL
  Filled 2011-06-12 (×3): qty 1

## 2011-06-12 MED ORDER — POLYETHYLENE GLYCOL 3350 17 G PO PACK: 17.0000 g | PACK | Freq: Every day | ORAL | Status: DC | PRN

## 2011-06-12 MED ORDER — CARVEDILOL 6.25 MG PO TABS
18.7500 mg | ORAL_TABLET | Freq: Two times a day (BID) | ORAL | Status: DC
  Administered 2011-06-12 – 2011-06-14 (×5): 18.75 mg via ORAL
  Filled 2011-06-12 (×7): qty 1

## 2011-06-12 MED ORDER — ONE-DAILY MULTI VITAMINS PO TABS: 1.0000 | ORAL_TABLET | Freq: Every day | ORAL | Status: DC

## 2011-06-12 MED ORDER — CALCIUM CARBONATE-VITAMIN D 500-200 MG-UNIT PO TABS
1.0000 | ORAL_TABLET | Freq: Every day | ORAL | Status: DC
  Administered 2011-06-12 – 2011-06-14 (×3): 1 via ORAL
  Filled 2011-06-12 (×4): qty 1

## 2011-06-12 MED ORDER — DEXTROSE 50 % IV SOLN
INTRAVENOUS | Status: AC
  Administered 2011-06-12: 09:00:00
  Filled 2011-06-12: qty 50

## 2011-06-12 MED ORDER — FERROUS SULFATE 325 (65 FE) MG PO TABS
325.0000 mg | ORAL_TABLET | Freq: Two times a day (BID) | ORAL | Status: DC
  Administered 2011-06-12 – 2011-06-14 (×5): 325 mg via ORAL
  Filled 2011-06-12 (×7): qty 1

## 2011-06-12 MED ORDER — THERA M PLUS PO TABS
1.0000 | ORAL_TABLET | Freq: Every day | ORAL | Status: DC
  Administered 2011-06-12 – 2011-06-14 (×3): 1 via ORAL
  Filled 2011-06-12 (×3): qty 1

## 2011-06-12 MED ORDER — DEXTROSE 5 % IV SOLN
INTRAVENOUS | Status: AC
  Administered 2011-06-12: 03:00:00 via INTRAVENOUS

## 2011-06-12 MED ORDER — OXYCODONE-ACETAMINOPHEN 5-325 MG PO TABS
1.0000 | ORAL_TABLET | ORAL | Status: DC | PRN
  Administered 2011-06-12 – 2011-06-14 (×4): 1 via ORAL
  Filled 2011-06-12: qty 2
  Filled 2011-06-12 (×2): qty 1

## 2011-06-12 MED ORDER — ENOXAPARIN SODIUM 40 MG/0.4ML ~~LOC~~ SOLN
40.0000 mg | Freq: Every day | SUBCUTANEOUS | Status: DC
  Administered 2011-06-12 – 2011-06-14 (×3): 40 mg via SUBCUTANEOUS
  Filled 2011-06-12 (×3): qty 0.4

## 2011-06-12 MED ORDER — DEXTROSE 5 % IV SOLN: INTRAVENOUS | Status: DC

## 2011-06-12 MED ORDER — FUROSEMIDE 80 MG PO TABS
80.0000 mg | ORAL_TABLET | Freq: Two times a day (BID) | ORAL | Status: DC
  Administered 2011-06-12 – 2011-06-14 (×5): 80 mg via ORAL
  Filled 2011-06-12 (×7): qty 1

## 2011-06-12 MED ORDER — DOCUSATE SODIUM 100 MG PO CAPS
100.0000 mg | ORAL_CAPSULE | Freq: Every day | ORAL | Status: DC
  Administered 2011-06-12 – 2011-06-13 (×2): 100 mg via ORAL
  Filled 2011-06-12 (×2): qty 1

## 2011-06-12 MED ORDER — DEXTROSE 50 % IV SOLN
INTRAVENOUS | Status: AC
  Administered 2011-06-12: 50 mL via INTRAVENOUS
  Filled 2011-06-12: qty 50

## 2011-06-12 MED ORDER — COENZYME Q10 200 MG PO CAPS: 200.0000 mg | ORAL_CAPSULE | Freq: Every day | ORAL | Status: DC

## 2011-06-12 MED ORDER — SIMVASTATIN 20 MG PO TABS
20.0000 mg | ORAL_TABLET | Freq: Every day | ORAL | Status: DC
  Administered 2011-06-12 – 2011-06-14 (×3): 20 mg via ORAL
  Filled 2011-06-12 (×3): qty 1

## 2011-06-12 MED ORDER — DEXTROSE 50 % IV SOLN
50.0000 mL | Freq: Once | INTRAVENOUS | Status: AC | PRN
  Administered 2011-06-12: 50 mL via INTRAVENOUS

## 2011-06-12 MED ORDER — POTASSIUM CHLORIDE CRYS ER 20 MEQ PO TBCR
EXTENDED_RELEASE_TABLET | ORAL | Status: AC
  Filled 2011-06-12: qty 2

## 2011-06-12 MED ORDER — AMLODIPINE BESYLATE 10 MG PO TABS
10.0000 mg | ORAL_TABLET | Freq: Every day | ORAL | Status: DC
  Administered 2011-06-12 – 2011-06-14 (×3): 10 mg via ORAL
  Filled 2011-06-12 (×3): qty 1

## 2011-06-12 MED ORDER — DEXTROSE 50 % IV SOLN
25.0000 g | Freq: Once | INTRAVENOUS | Status: AC
  Administered 2011-06-12: 25 g via INTRAVENOUS
  Filled 2011-06-12: qty 50

## 2011-06-12 NOTE — Progress Notes (Signed)
PHARMACIST - PHYSICIAN ORDER COMMUNICATION  CONCERNING: P&T Medication Policy on Herbal Medications  DESCRIPTION:  This patient's order for:  Co Enzyme Q10  has been noted.  This product(s) is classified as an "herbal" or natural product. Due to a lack of definitive safety studies or FDA approval, nonstandard manufacturing practices, plus the potential risk of unknown drug-drug interactions while on inpatient medications, the Pharmacy and Therapeutics Committee does not permit the use of "herbal" or natural products of this type within [facility].   ACTION TAKEN: The pharmacy department is unable to verify this order at this time and your patient has been informed of this safety policy. Please reevaluate patient's clinical condition at discharge and address if the herbal or natural product(s) should be resumed at that time.   

## 2011-06-12 NOTE — H&P (Signed)
Norma [name] is an 65 y.o. female.   Chief Complaint: Low Blood sugar HPI: 65 yo with history of DM s/p recent AF Bypass grafting brought in with persistent hypoglycemia as low as 26. Has been on multiple medications including Lantus, Novolog, Amaryl, Metformin. Initial resuscitation in Ed has not resolve the Hypoglycemia hence she is being admitted for further treatment. She fels slightly dizzy, no loss of Consciousness, no NVD, No fever, no headache.  Past Medical History  Diagnosis Date  . Claudication   . Diabetes mellitus   . Hypertension   . Hyperlipidemia   . DJD (degenerative joint disease)   . PAD (peripheral artery disease)   . Coronary artery disease   . Leg pain   . Carotid artery occlusion   . Obesity   . DDD (degenerative disc disease)   . DJD (degenerative joint disease)   . Shortness of breath     exertion  . Anxiety     Past Surgical History  Procedure Date  . Cardiac catheterization 12/01/2009    EF 65%  . Removal of fibrous cyst from right breast 10+ YEARS  . Retinal detachment surgery 10+ YEARS    LEFT EYE  . Coronary artery bypass graft 12/08/2009    LIMA GRAFT TO THE DISTAL LAD AND SAPHENOUS VEIN GRAFT TO THE OBTUSE MARGINAL VESSEL  . Transthoracic echocardiogram 12/01/2009    EF 60-65%  . Cardiovascular stress test 11/28/2009    EF 75%  . Aorta - bilateral femoral artery bypass graft 05/25/2011    Procedure: AORTA BIFEMORAL BYPASS GRAFT;  Surgeon: Mal Misty, MD;  Location: [facility] OR;  Service: Vascular;  Laterality: N/A;    Family History  Problem Relation Age of Onset  . Hypertension Mother   . Coronary artery disease Father   . Hypertension Sister   . Cirrhosis Brother   . Kidney disease Daughter    Social History:  reports that she quit smoking about 19 years ago. Her smoking use included Cigarettes. She quit after 20 years of use. She has never used smokeless tobacco. She reports that she does not drink alcohol or use illicit  drugs.  Allergies:  Allergies  Allergen Reactions  . Iohexol      Code: RASH, Desc: pt called 1 day post scanning stating that skin was red and "itching all over" some what better but still had symptom.. instructed pt to take benadryl to relieve symptoms,per dr Alvester Chou.if any problems call back/mms, Onset Date: [id number]   . Sulfa Antibiotics     Medications Prior to Admission  Medication Dose Route Frequency Provider Last Rate Last Dose  . dextrose 5 % solution   Intravenous STAT Veryl Speak, MD 75 mL/hr at 06/12/11 0310    . dextrose 50 % solution 25 g  25 g Intravenous Once Blair Heys, MD   25 g at 06/12/11 0023  . dextrose 50 % solution           . dextrose 50 % solution        50 mL at 06/12/11 0400   Medications Prior to Admission  Medication Sig Dispense Refill  . acetaminophen (TYLENOL) 325 MG tablet Take 1-2 tablets (325-650 mg total) by mouth every 4 (four) hours as needed (or temp >/= 101 F).  30 tablet  0  . amLODipine (NORVASC) 10 MG tablet Take 10 mg by mouth daily.        Marland Kitchen aspirin 325 MG tablet Take 325 mg by mouth daily.        Marland Kitchen  atorvastatin (LIPITOR) 20 MG tablet Take 20 mg by mouth daily.        . Calcium Carbonate-Vitamin D (CALCIUM + D PO) Take by mouth daily.        . carvedilol (COREG) 6.25 MG tablet Take 3 tablets (18.75 mg total) by mouth 2 (two) times daily with a meal.  30 tablet  0  . Coenzyme Q10 200 MG capsule Take 200 mg by mouth daily.        Marland Kitchen docusate (COLACE) 50 MG/5ML liquid Take 10 mLs (100 mg total) by mouth daily.  100 mL  0  . ferrous sulfate 325 (65 FE) MG tablet Take 1 tablet (325 mg total) by mouth 2 (two) times daily with a meal.  30 tablet  0  . fish oil-omega-3 fatty acids 1000 MG capsule Take 1 g by mouth 2 (two) times daily.        . furosemide (LASIX) 80 MG tablet Take 1 tablet (80 mg total) by mouth 2 (two) times daily.  1 tablet  0  . glimepiride (AMARYL) 4 MG tablet Take 4 mg by mouth daily before breakfast.        . hydrALAZINE  (APRESOLINE) 25 MG tablet Take 2 tablets (50 mg total) by mouth 3 (three) times daily.      . insulin aspart (NOVOLOG) 100 UNIT/ML injection Inject 4 Units into the skin 3 (three) times daily before meals.        . insulin aspart (NOVOLOG) 100 UNIT/ML injection Inject 0-9 Units into the skin 3 (three) times daily with meals. Pt is on a sliding scale       . insulin glargine (LANTUS) 100 UNIT/ML injection Inject 36 Units into the skin 2 (two) times daily.        . metFORMIN (GLUCOPHAGE) 1000 MG tablet Take 1,000 mg by mouth 2 (two) times daily with a meal.        . Multiple Vitamin (MULTIVITAMIN) tablet Take 1 tablet by mouth daily.        Marland Kitchen oxyCODONE-acetaminophen (PERCOCET) 5-325 MG per tablet Take 1-2 tablets by mouth every 4 (four) hours as needed. For pain       . pantoprazole (PROTONIX) 40 MG tablet Take 1 tablet (40 mg total) by mouth daily at 12 noon.  30 tablet  0  . DISCONTD: insulin aspart (NOVOLOG) 100 UNIT/ML injection Inject 0-9 Units into the skin 3 (three) times daily with meals.  1 vial  0  . polyethylene glycol (MIRALAX / GLYCOLAX) packet Take 17 g by mouth daily as needed.  14 each  0    Results for orders placed during the hospital encounter of 06/12/11 (from the past 48 hour(s))  GLUCOSE, CAPILLARY     Status: Abnormal   Collection Time   06/12/11 12:17 AM      Component Value Range Comment   Glucose-Capillary 40 (*) 70 - 99 (mg/dL)   CBC     Status: Abnormal   Collection Time   06/12/11 12:35 AM      Component Value Range Comment   WBC 13.0 (*) 4.0 - 10.5 (K/uL)    RBC 3.60 (*) 3.87 - 5.11 (MIL/uL)    Hemoglobin 9.7 (*) 12.0 - 15.0 (g/dL)    HCT 31.3 (*) 36.0 - 46.0 (%)    MCV 86.9  78.0 - 100.0 (fL)    MCH 26.9  26.0 - 34.0 (pg)    MCHC 31.0  30.0 - 36.0 (g/dL)    RDW  17.4 (*) 11.5 - 15.5 (%)    Platelets 552 (*) 150 - 400 (K/uL)   DIFFERENTIAL     Status: Abnormal   Collection Time   06/12/11 12:35 AM      Component Value Range Comment   Neutrophils Relative 83  (*) 43 - 77 (%)    Neutro Abs 10.8 (*) 1.7 - 7.7 (K/uL)    Lymphocytes Relative 9 (*) 12 - 46 (%)    Lymphs Abs 1.2  0.7 - 4.0 (K/uL)    Monocytes Relative 8  3 - 12 (%)    Monocytes Absolute 1.0  0.1 - 1.0 (K/uL)    Eosinophils Relative 0  0 - 5 (%)    Eosinophils Absolute 0.0  0.0 - 0.7 (K/uL)    Basophils Relative 0  0 - 1 (%)    Basophils Absolute 0.0  0.0 - 0.1 (K/uL)   COMPREHENSIVE METABOLIC PANEL     Status: Abnormal   Collection Time   06/12/11 12:35 AM      Component Value Range Comment   Sodium 148 (*) 135 - 145 (mEq/L)    Potassium 2.9 (*) 3.5 - 5.1 (mEq/L)    Chloride 107  96 - 112 (mEq/L)    CO2 27  19 - 32 (mEq/L)    Glucose, Bld 96  70 - 99 (mg/dL)    BUN 47 (*) 6 - 23 (mg/dL)    Creatinine, Ser 1.33 (*) 0.50 - 1.10 (mg/dL)    Calcium 8.3 (*) 8.4 - 10.5 (mg/dL)    Total Protein 6.2  6.0 - 8.3 (g/dL)    Albumin 2.3 (*) 3.5 - 5.2 (g/dL)    AST 29  0 - 37 (U/L)    ALT 15  0 - 35 (U/L)    Alkaline Phosphatase 85  39 - 117 (U/L)    Total Bilirubin 0.5  0.3 - 1.2 (mg/dL)    GFR calc non Af Amer 41 (*) >90 (mL/min)    GFR calc Af Amer 47 (*) >90 (mL/min)    No results found.  Review of Systems  Constitutional: Positive for malaise/fatigue.  HENT: Negative.   Eyes: Negative.   Respiratory: Negative.   Cardiovascular: Negative.   Genitourinary: Negative.   Musculoskeletal: Negative.   Skin: Negative.   Neurological: Positive for weakness.  Endo/Heme/Allergies: Negative.   Psychiatric/Behavioral: Negative.     Blood pressure 155/39, pulse 67, temperature 98.1 F (36.7 C), temperature source Oral, resp. rate 16, SpO2 95.00%. Physical Exam  Constitutional: She is oriented to person, place, and time. She appears well-developed and well-nourished.  HENT:  Head: Normocephalic and atraumatic.  Right Ear: External ear normal.  Left Ear: External ear normal.  Eyes: Conjunctivae and EOM are normal. Pupils are equal, round, and reactive to light.  Neck: Normal range  of motion. Neck supple.  Cardiovascular: Normal rate and regular rhythm.   Murmur heard. Respiratory: Effort normal and breath sounds normal.  GI: Soft. Bowel sounds are normal.  Musculoskeletal: Normal range of motion.  Neurological: She is alert and oriented to person, place, and time. She has normal reflexes.  Skin: Skin is warm and dry.  Psychiatric: She has a normal mood and affect.     Assessment/Plan 1. Hypoglycemia: Will hold all Diabetic medications, D50 given, continue to give D5W and serial CBGs, restart medications at a lower rate later. 2. HTN: continue homw medications 3.Leucocytosis: from recent surgery. No evidence of infection at site 4. CAD: stable Hypokalemia: Continue to replete  GARBA,LAWAL 06/12/2011, 4:49 AM

## 2011-06-12 NOTE — Progress Notes (Signed)
06/12/2011 [place] [place] Case Management Note [phone]       Utilization review completed.

## 2011-06-12 NOTE — ED Notes (Signed)
Pt here with complaints from ems about low blood sugar, pt alert and oriented and  Requesting a blanket, pt was disoriented upon arrival of ems to nursing home, pt blood sugar was 21 per ems and 1 amp of D50 given

## 2011-06-12 NOTE — ED Notes (Signed)
Pt given po food graham crackers and peanut butter to eat,

## 2011-06-12 NOTE — ED Provider Notes (Addendum)
History     CSN: [id number] Arrival date & time: 06/12/2011 12:12 AM   First MD Initiated Contact with Patient 06/12/11 0026      Chief Complaint  Patient presents with  . Hypoglycemia    (Consider location/radiation/quality/duration/timing/severity/associated sxs/prior treatment) HPI Comments: Patient is ecf status post aortofemoral bypass.  Blood sugar dropped to 21 while there.  Was given D50 by ems, now improving.  Denies pain.  No other complaints.  No change in insulin doses recently.    Patient is a 65 y.o. female presenting with altered mental status.  Altered Mental Status This is a new problem. The current episode started less than 1 hour ago. The problem has been rapidly improving.    Past Medical History  Diagnosis Date  . Claudication   . Diabetes mellitus   . Hypertension   . Hyperlipidemia   . DJD (degenerative joint disease)   . PAD (peripheral artery disease)   . Coronary artery disease   . Leg pain   . Carotid artery occlusion   . Obesity   . DDD (degenerative disc disease)   . DJD (degenerative joint disease)   . Shortness of breath     exertion  . Anxiety     Past Surgical History  Procedure Date  . Cardiac catheterization 12/01/2009    EF 65%  . Removal of fibrous cyst from right breast 10+ YEARS  . Retinal detachment surgery 10+ YEARS    LEFT EYE  . Coronary artery bypass graft 12/08/2009    LIMA GRAFT TO THE DISTAL LAD AND SAPHENOUS VEIN GRAFT TO THE OBTUSE MARGINAL VESSEL  . Transthoracic echocardiogram 12/01/2009    EF 60-65%  . Cardiovascular stress test 11/28/2009    EF 75%  . Aorta - bilateral femoral artery bypass graft 05/25/2011    Procedure: AORTA BIFEMORAL BYPASS GRAFT;  Surgeon: Mal Misty, MD;  Location: [facility] OR;  Service: Vascular;  Laterality: N/A;    Family History  Problem Relation Age of Onset  . Hypertension Mother   . Coronary artery disease Father   . Hypertension Sister   . Cirrhosis Brother   . Kidney disease  Daughter     History  Substance Use Topics  . Smoking status: Former Smoker -- 20 years    Types: Cigarettes    Quit date: 07/10/1991  . Smokeless tobacco: Never Used  . Alcohol Use: No    OB History    Grav Para Term Preterm Abortions TAB SAB Ect Mult Living                  Review of Systems  Psychiatric/Behavioral: Positive for altered mental status.  All other systems reviewed and are negative.    Allergies  Iohexol and Sulfa antibiotics  Home Medications   Current Outpatient Rx  Name Route Sig Dispense Refill  . ACETAMINOPHEN 325 MG PO TABS Oral Take 1-2 tablets (325-650 mg total) by mouth every 4 (four) hours as needed (or temp >/= 101 F). 30 tablet 0  . AMLODIPINE BESYLATE 10 MG PO TABS Oral Take 10 mg by mouth daily.      . ASPIRIN 325 MG PO TABS Oral Take 325 mg by mouth daily.      . ATORVASTATIN CALCIUM 20 MG PO TABS Oral Take 20 mg by mouth daily.      Marland Kitchen CALCIUM + D PO Oral Take by mouth daily.      Marland Kitchen CARVEDILOL 6.25 MG PO TABS Oral Take 3 tablets (  18.75 mg total) by mouth 2 (two) times daily with a meal. 30 tablet 0  . COENZYME Q10 200 MG PO CAPS Oral Take 200 mg by mouth daily.      Marland Kitchen DOCUSATE SODIUM 50 MG/5ML PO LIQD Oral Take 10 mLs (100 mg total) by mouth daily. 100 mL 0  . FERROUS SULFATE 325 (65 FE) MG PO TABS Oral Take 1 tablet (325 mg total) by mouth 2 (two) times daily with a meal. 30 tablet 0  . OMEGA-3 FATTY ACIDS 1000 MG PO CAPS Oral Take 1 g by mouth 2 (two) times daily.      . FUROSEMIDE 80 MG PO TABS Oral Take 1 tablet (80 mg total) by mouth 2 (two) times daily. 1 tablet 0  . GLIMEPIRIDE 4 MG PO TABS Oral Take 4 mg by mouth daily before breakfast.      . HYDRALAZINE HCL 25 MG PO TABS Oral Take 2 tablets (50 mg total) by mouth 3 (three) times daily.    . INSULIN ASPART 100 UNIT/ML [place] SOLN Subcutaneous Inject 4 Units into the skin 3 (three) times daily before meals.      . INSULIN ASPART 100 UNIT/ML [place] SOLN Subcutaneous Inject 0-9 Units into the  skin 3 (three) times daily with meals. Pt is on a sliding scale     . INSULIN GLARGINE 100 UNIT/ML [place] SOLN Subcutaneous Inject 36 Units into the skin 2 (two) times daily.      Marland Kitchen METFORMIN HCL 1000 MG PO TABS Oral Take 1,000 mg by mouth 2 (two) times daily with a meal.      . ONE-DAILY MULTI VITAMINS PO TABS Oral Take 1 tablet by mouth daily.      . OXYCODONE-ACETAMINOPHEN 5-325 MG PO TABS Oral Take 1-2 tablets by mouth every 4 (four) hours as needed. For pain     . PANTOPRAZOLE SODIUM 40 MG PO TBEC Oral Take 1 tablet (40 mg total) by mouth daily at 12 noon. 30 tablet 0  . POLYETHYLENE GLYCOL 3350 PO PACK Oral Take 17 g by mouth daily as needed. 14 each 0    BP 148/87  Pulse 67  Temp(Src) 98.2 F (36.8 C) (Oral)  Resp 16  SpO2 93%  Physical Exam  Constitutional: She is oriented to person, place, and time. She appears well-developed and well-nourished.       Obese female, no acute distress.  HENT:  Head: Atraumatic.  Eyes: EOM are normal. Pupils are equal, round, and reactive to light.  Neck: Normal range of motion. Neck supple.  Cardiovascular: Normal rate and regular rhythm.  Exam reveals no gallop and no friction rub.   No murmur heard. Pulmonary/Chest: Effort normal and breath sounds normal.  Abdominal: Soft. Bowel sounds are normal. She exhibits no distension. There is no tenderness.  Musculoskeletal: Normal range of motion. She exhibits no edema.  Neurological: She is alert and oriented to person, place, and time.  Skin: Skin is warm and dry.    ED Course  Procedures (including critical care time)   Labs Reviewed  POCT CBG MONITORING   No results found.   No diagnosis found.    MDM  Prolonged, recurrent hypoglycemia refractory to repeat D50.  Likely related to amaryl.  Will admit to medicine.        Veryl Speak, MD 06/12/11 [id number]  Veryl Speak, MD 06/12/11 [phone]

## 2011-06-12 NOTE — Progress Notes (Signed)
Inpatient Diabetes Program Recommendations  AACE/ADA: New Consensus Statement on Inpatient Glycemic Control (2009)  Target Ranges:  Prepandial:   less than 140 mg/dL      Peak postprandial:   less than 180 mg/dL (1-2 hours)      Critically ill patients:  140 - 180 mg/dL   Reason for Visit: Patient admitted with hypoglycemia.  Note most recent A1c was 5.5 %.  Inpatient Diabetes Program Recommendations Insulin - Basal: Will likely need decrease in home Lantus dose prior to discharge due to admission with hypoglycemia.  Note: Needs reduction in doses of home diabetes medications prior to discharge from hospital.

## 2011-06-12 NOTE — ED Notes (Signed)
cbg is 16 MD made aware

## 2011-06-12 NOTE — Progress Notes (Signed)
PCP: [name]., MD  Brief narrative: 65 yo with history of DM s/p recent AF Bypass grafting brought in with persistent hypoglycemia as low as 26. Has been on multiple medications including Lantus, Novolog, Amaryl, Metformin. Initial resuscitation in Ed has not resolve the Hypoglycemia hence she is being admitted for further treatment.   Past medical history: Includes diabetes, hypertension, hyperlipidemia, peripheral vascular disease, coronary artery disease, status post CABG.  Consultants: None. So far  Procedures: None  Subjective: Patient feels fine. Denies any dizziness or lightheadedness. She's worried as to why she has the hypoglycemia. Does not report any changes to her medication regimen recently. Does not report any changes to her dosage recently.  Objective: Vital signs in last 24 hours: Temp:  [98.1 F (36.7 C)-99 F (37.2 C)] 99 F (37.2 C) (12/04 0522) Pulse Rate:  [66-72] 72  (12/04 0522) Resp:  [16-22] 22  (12/04 0522) BP: (146-160)/(39-87) 160/67 mmHg (12/04 0522) SpO2:  [93 %-98 %] 95 % (12/04 0522) Weight:  [101.2 kg (223 lb 1.7 oz)] 223 lb 1.7 oz (101.2 kg) (12/04 0522) Weight change:  Last BM Date: 06/11/11  Intake/Output from previous day: 12/03 0701 - 12/04 0700 In: 175 [I.V.:175] Out: -  Intake/Output this shift: Total I/O In: 360 [P.O.:360] Out: -   General appearance: alert, cooperative, appears stated age and no distress Head: Normocephalic, without obvious abnormality, atraumatic Throat: lips, mucosa, and tongue normal; teeth and gums normal Neck: no adenopathy, no carotid bruit, no JVD, supple, symmetrical, trachea midline and thyroid not enlarged, symmetric, no tenderness/mass/nodules Resp: clear to auscultation bilaterally Cardio: regular rate and rhythm, S1, S2 normal, no murmur, click, rub or gallop GI: soft, non-tender; bowel sounds normal; no masses,  no organomegaly Extremities: extremities normal, atraumatic, no cyanosis or  edema Pulses: 2+ and symmetric Skin: Skin color, texture, turgor normal. No rashes or lesions Lymph nodes: Cervical, supraclavicular, and axillary nodes normal. Neurologic: Grossly normal Incision/Wound: the insertion site on the abdomen looks fine. There is no erythema. No discharge is noted.  Lab Results:  [facility] 06/12/11 0622 06/12/11 0035  WBC 13.6* 13.0*  HGB 9.4* 9.7*  HCT 30.5* 31.3*  PLT 532* 552*   BMET  Basename 06/12/11 0622 06/12/11 0035  NA -- 148*  K -- 2.9*  CL -- 107  CO2 -- 27  GLUCOSE -- 96  BUN -- 47*  CREATININE 1.22* 1.33*  CALCIUM -- 8.3*    Studies/Results: No results found.  Medications:  Scheduled:   . amLODipine  10 mg Oral Daily  . aspirin  325 mg Oral Daily  . calcium-vitamin D  1 tablet Oral Daily  . carvedilol  18.75 mg Oral BID WC  . dextrose   Intravenous STAT  . dextrose  25 g Intravenous Once  . dextrose      . dextrose      . dextrose      . docusate sodium  100 mg Oral Daily  . enoxaparin  40 mg Subcutaneous Daily  . ferrous sulfate  325 mg Oral BID WC  . furosemide  80 mg Oral BID  . hydrALAZINE  50 mg Oral Q8H  . multivitamins ther. w/minerals  1 tablet Oral Daily  . pantoprazole  40 mg Oral Q1200  . potassium chloride SA      . potassium chloride  40 mEq Oral Once  . simvastatin  20 mg Oral Daily  . DISCONTD: Coenzyme Q10  200 mg Oral Daily  . DISCONTD: multivitamin  1  tablet Oral Daily    Principal Problem:  *Hypoglycemia Active Problems:  Hyperlipidemia  Hypertension  Diabetes mellitus type 2, insulin dependent  Leucocytosis  Hypokalemia  Hypernatremia   Assessment/Plan: #1 hypoglycemia: I feel this may have something to do with her renal insufficiency. Postoperatively after her aortofemoral bypass patient developed renal failure. This has improved, but is not quite back to her baseline. Her GFR is still low. I believe this may have caused her to drop her blood sugars. Her medications some may have lasted  longer than usual in her system. She is currently requiring D10 infusion. We will continue the same until her blood sugars rise. The D10 will then be tapered off. We will also check her HbA1c on her.  #2 recent aortofemoral bypass: This issue appears to be stable. I did try to contact Dr. Kellie Simmering, however, he is on vacation up until Friday.  #3 recent acute renal failure: Renal function appears to be stable. Continue to monitor closely.  #4 hypokalemia. Will be repleted cautiously.  #5 history of hypertension, and hyperlipidemia are stable.  #6 mild leukocytosis: She is afebrile. Hold off on antibiotics. Monitor her counts daily.  #7 mild hypernatremia: Monitor.  #8 mild anemia: Anemia panel. Will be checked.  DVT, prophylaxis with Lovenox.    LOS: 0 days   Norma [name] 06/12/2011, 4:16 PM

## 2011-06-12 NOTE — ED Notes (Signed)
Pt found with family in room and was confused, pt cbg read low and MD made aware, and 1 amp dextrose given, pt now alert and oriented and encouraged family to make her eat

## 2011-06-13 LAB — BASIC METABOLIC PANEL
BUN: 31 mg/dL — ABNORMAL HIGH (ref 6–23)
CO2: 28 mEq/L (ref 19–32)
GFR calc non Af Amer: 45 mL/min — ABNORMAL LOW (ref >90)
Glucose, Bld: 136 mg/dL — ABNORMAL HIGH (ref 70–99)
Potassium: 3.2 mEq/L — ABNORMAL LOW (ref 3.5–5.1)

## 2011-06-13 LAB — CBC
HCT: 28.8 % — ABNORMAL LOW (ref 36.0–46.0)
Hemoglobin: 8.8 g/dL — ABNORMAL LOW (ref 12.0–15.0)
MCH: 26.4 pg (ref 26.0–34.0)
MCHC: 30.6 g/dL (ref 30.0–36.0)
MCV: 86.5 fL (ref 78.0–100.0)
RBC: 3.33 MIL/uL — ABNORMAL LOW (ref 3.87–5.11)

## 2011-06-13 LAB — GLUCOSE, CAPILLARY
Glucose-Capillary: 118 mg/dL — ABNORMAL HIGH (ref 70–99)
Glucose-Capillary: 109 mg/dL — ABNORMAL HIGH (ref 70–99)
Glucose-Capillary: 131 mg/dL — ABNORMAL HIGH (ref 70–99)

## 2011-06-13 LAB — HEMOGLOBIN A1C: Hgb A1c MFr Bld: 6.1 % — ABNORMAL HIGH (ref <5.7)

## 2011-06-13 MED ORDER — DOCUSATE SODIUM 100 MG PO CAPS
100.0000 mg | ORAL_CAPSULE | Freq: Two times a day (BID) | ORAL | Status: DC
  Administered 2011-06-13 – 2011-06-14 (×2): 100 mg via ORAL
  Filled 2011-06-13 (×3): qty 1

## 2011-06-13 MED ORDER — INSULIN GLARGINE 100 UNIT/ML ~~LOC~~ SOLN
6.0000 [IU] | Freq: Every day | SUBCUTANEOUS | Status: DC
  Administered 2011-06-13: 6 [IU] via SUBCUTANEOUS
  Filled 2011-06-13: qty 3

## 2011-06-13 MED ORDER — INSULIN ASPART 100 UNIT/ML ~~LOC~~ SOLN
0.0000 [IU] | Freq: Every day | SUBCUTANEOUS | Status: DC
  Administered 2011-06-13: 5 [IU] via SUBCUTANEOUS
  Filled 2011-06-13: qty 3

## 2011-06-13 MED ORDER — INSULIN ASPART 100 UNIT/ML ~~LOC~~ SOLN
0.0000 [IU] | Freq: Three times a day (TID) | SUBCUTANEOUS | Status: DC
  Administered 2011-06-14 (×2): 2 [IU] via SUBCUTANEOUS
  Filled 2011-06-13: qty 3

## 2011-06-13 MED ORDER — POLYETHYLENE GLYCOL 3350 17 G PO PACK
17.0000 g | PACK | Freq: Every day | ORAL | Status: DC
  Administered 2011-06-13 – 2011-06-14 (×2): 17 g via ORAL
  Filled 2011-06-13 (×3): qty 1

## 2011-06-13 MED ORDER — INSULIN GLARGINE 100 UNIT/ML ~~LOC~~ SOLN
10.0000 [IU] | Freq: Every day | SUBCUTANEOUS | Status: DC
  Filled 2011-06-13: qty 3

## 2011-06-13 MED ORDER — POTASSIUM CHLORIDE CRYS ER 20 MEQ PO TBCR
60.0000 meq | EXTENDED_RELEASE_TABLET | Freq: Once | ORAL | Status: AC
  Administered 2011-06-13: 60 meq via ORAL
  Filled 2011-06-13 (×2): qty 3

## 2011-06-13 NOTE — Progress Notes (Signed)
Clinical Social Work Theatre manager, Billie Ruddy completed psychosocial assessment and placed in shadow chart. Pt from [facility] and would like to return at D/C. Clinical Social Worker contacted [facility] who confirmed pt can return when medically ready for D/C. Clinical Social Worker to complete FL-2 and facilitate pt D/C needs when pt medically ready for D/C.  Drake Leach, MSW, [place] Social Work [phone]

## 2011-06-13 NOTE — Progress Notes (Signed)
PCP: [name]., MD  Brief narrative: 65 yo with history of DM s/p recent AF Bypass grafting brought in with persistent hypoglycemia as low as 26. Has been on multiple medications including Lantus, Novolog, Amaryl, Metformin. Initial resuscitation in Ed did not resolve the Hypoglycemia hence she was admitted for further treatment.   Past medical history: Includes diabetes, hypertension, hyperlipidemia, peripheral vascular disease, coronary artery disease, status post CABG.  Consultants: None so far  Procedures: None  Subjective: Patient feels better today. Denies any complaints currently.  Objective: Vital signs in last 24 hours: Temp:  [98.3 F (36.8 C)-98.8 F (37.1 C)] 98.3 F (36.8 C) (12/05 1423) Pulse Rate:  [64-65] 64  (12/05 1423) Resp:  [12-21] 18  (12/05 1423) BP: (134-155)/(53-72) 146/72 mmHg (12/05 1423) SpO2:  [91 %-94 %] 93 % (12/05 1423) Weight change:  Last BM Date: 06/12/11  Intake/Output from previous day: 12/04 0701 - 12/05 0700 In: 2188.8 [P.O.:720; I.V.:1468.8] Out: -  Intake/Output this shift: Total I/O In: 21 [P.O.:560] Out: 1 [Urine:1]  General appearance: alert, cooperative, appears stated age and no distress Head: Normocephalic, without obvious abnormality, atraumatic Neck: no adenopathy, no carotid bruit, no JVD, supple, symmetrical, trachea midline and thyroid not enlarged, symmetric, no tenderness/mass/nodules Resp: clear to auscultation bilaterally Cardio: regular rate and rhythm, S1, S2 normal, no murmur, click, rub or gallop GI: soft, non-tender; bowel sounds normal; no masses,  no organomegaly  Lab Results:  [facility] 06/13/11 0555 06/12/11 0622  WBC 11.8* 13.6*  HGB 8.8* 9.4*  HCT 28.8* 30.5*  PLT 439* 532*   BMET  Basename 06/13/11 0555 06/12/11 0622 06/12/11 0035  NA 142 -- 148*  K 3.2* -- 2.9*  CL 104 -- 107  CO2 28 -- 27  GLUCOSE 136* -- 96  BUN 31* -- 47*  CREATININE 1.22* 1.22* --  CALCIUM 7.5* -- 8.3*     Studies/Results: No results found.  Medications:  Scheduled:    . amLODipine  10 mg Oral Daily  . aspirin  325 mg Oral Daily  . calcium-vitamin D  1 tablet Oral Daily  . carvedilol  18.75 mg Oral BID WC  . docusate sodium  100 mg Oral BID  . enoxaparin  40 mg Subcutaneous Daily  . ferrous sulfate  325 mg Oral BID WC  . furosemide  80 mg Oral BID  . hydrALAZINE  50 mg Oral Q8H  . multivitamins ther. w/minerals  1 tablet Oral Daily  . pantoprazole  40 mg Oral Q1200  . polyethylene glycol  17 g Oral Daily  . potassium chloride SA      . potassium chloride  60 mEq Oral Once  . simvastatin  20 mg Oral Daily  . DISCONTD: docusate sodium  100 mg Oral Daily    Principal Problem:  *Hypoglycemia Active Problems:  Hyperlipidemia  Hypertension  Diabetes mellitus type 2, insulin dependent  Leucocytosis  Hypokalemia  Hypernatremia   Assessment/Plan: #1 hypoglycemia: Improved. Stop D10. Monitor off fluids and insulin. Re institute insulin once CBG stays high. Will likely need to stop the OHA. I feel this may have had something to do with her renal insufficiency. Postoperatively after her aortofemoral bypass patient developed renal failure. This has improved, but is not quite back to her baseline. Her GFR is still low. I believe this may have caused her to drop her blood sugars. Her medications some may have lasted longer than usual in her system. HbA1c is 6.1.  #2 recent aortofemoral bypass: This issue appears  to be stable. I did try to contact Dr. Kellie Simmering, however, he is on vacation up until Friday.  #3 recent acute renal failure: Renal function appears to be stable. Continue to monitor closely.  #4 hypokalemia. Will be repleted cautiously.  #5 history of hypertension, and hyperlipidemia are stable.  #6 mild leukocytosis: Improving. She remains afebrile.  #7 mild hypernatremia: Resolved.  #8 mild anemia: Anemia panel is pending. No overt bleeding noted. Drop in H/H likely in  part due to dilution.  DVT, prophylaxis with Lovenox.    LOS: 1 day   Norma [name] 06/13/2011, 3:44 PM

## 2011-06-14 DIAGNOSIS — N189 Chronic kidney disease, unspecified: Secondary | ICD-10-CM | POA: Diagnosis present

## 2011-06-14 LAB — BASIC METABOLIC PANEL
BUN: 32 mg/dL — ABNORMAL HIGH (ref 6–23)
CO2: 27 mEq/L (ref 19–32)
Chloride: 100 mEq/L (ref 96–112)
Creatinine, Ser: 1.41 mg/dL — ABNORMAL HIGH (ref 0.50–1.10)
GFR calc Af Amer: 44 mL/min — ABNORMAL LOW (ref >90)
Glucose, Bld: 281 mg/dL — ABNORMAL HIGH (ref 70–99)
Potassium: 4.4 mEq/L (ref 3.5–5.1)

## 2011-06-14 LAB — GLUCOSE, CAPILLARY: Glucose-Capillary: 237 mg/dL — ABNORMAL HIGH (ref 70–99)

## 2011-06-14 MED ORDER — OXYCODONE-ACETAMINOPHEN 5-325 MG PO TABS: 1.0000 | ORAL_TABLET | ORAL | Status: DC | PRN

## 2011-06-14 MED ORDER — POLYETHYLENE GLYCOL 3350 17 G PO PACK: 17.0000 g | PACK | Freq: Every day | ORAL | Status: DC | PRN

## 2011-06-14 MED ORDER — INSULIN ASPART 100 UNIT/ML ~~LOC~~ SOLN: 0.0000 [IU] | Freq: Three times a day (TID) | SUBCUTANEOUS | Status: DC

## 2011-06-14 MED ORDER — FUROSEMIDE 20 MG PO TABS: 60.0000 mg | ORAL_TABLET | Freq: Two times a day (BID) | ORAL | Status: DC

## 2011-06-14 MED ORDER — INSULIN ASPART 100 UNIT/ML ~~LOC~~ SOLN: 0.0000 [IU] | Freq: Every day | SUBCUTANEOUS | Status: DC

## 2011-06-14 MED ORDER — INSULIN GLARGINE 100 UNIT/ML ~~LOC~~ SOLN: 6.0000 [IU] | Freq: Two times a day (BID) | SUBCUTANEOUS | Status: DC

## 2011-06-14 NOTE — Progress Notes (Signed)
Clinical Social Worker facilitated pt D/C needs including contacting facility, family, and arranging ambulance transportation to [facility]. No further social work needs at this time.  Drake Leach, MSW, [place] Social Work [phone]

## 2011-06-14 NOTE — Discharge Summary (Signed)
Physician Discharge Summary  Patient ID: Norma [name] MRN: [id number] DOB/AGE: 1946/03/26 65 y.o.  Admit date: 06/12/2011 Discharge date: 06/14/2011  Admission Diagnoses: Hypoglycemia  Discharge Diagnoses:  Active Problems:  Hyperlipidemia  Hypertension  Diabetes mellitus type 2, insulin dependent  Leucocytosis  CKD (chronic kidney disease)  Discharged Condition: fair  Initial History: 65 yo with history of DM s/p recent AF Bypass grafting brought in with persistent hypoglycemia as low as 26. Has been on multiple medications including Lantus, Novolog, Amaryl, Metformin. Initial resuscitation in Ed did not resolve the Hypoglycemia hence she was admitted for further treatment.   Past medical history: Includes diabetes, hypertension, hyperlipidemia, peripheral vascular disease, coronary artery disease, status post CABG.   Consultants: None  Procedures: None  Hospital Course:   #1 hypoglycemia: We believe that the hypoglycemia was secondary to patient's kidney disease. The patient developed renal failure during her previous hospitalization when she underwent aortofemoral bypass surgery. She did recover her renal function, but the progressed into chronic kidney disease, with a low GFR. When the patient was discharged she was discharged on her usual dose of insulin along with her oral hypoglycemic agents. And then she proceeded to become hypoglycemic in the skilled nursing facility. During this hospitalization patient  required D10 infusion which was discontinued yesterday morning. Blood sugars remain elevated and so, Lantus was reinstituted last night. Blood sugar this morning was 241. She will require higher dose of Lantus. At this point, my recommendation is to stop all of her oral hypoglycemic agents. Lantus will be given at 6 units twice daily and will need to be titrated upwards depending on her CBGs at the skilled nursing facility. Her HbA1c was 6.1. Instructions have been  provided to the skilled nursing facility regarding titration of the Lantus.   #2 recent aortofemoral bypass: This issue appears to be stable. I did try to contact Dr. Kellie Simmering, however, he is on vacation up until Friday. Her staples will need to come out as scheduled.   #3 recent acute renal failure and is now with chronic kidney disease: Renal function appears to be stable. Continue to monitor closely. We will have a repeat BMET checked on Monday to make, sure her renal function is stable. Her dose of lasix will be reduced to 60 mg twice daily because her creatinine is slightly higher, upto 1.41 today.  #4 hypokalemia. Potassium was given to the patient and her potassium level has come back to normal.  #5 history of hypertension, and hyperlipidemia were stable.   #6 mild leukocytosis: Improving. She remains afebrile. She was not put on any antibiotics.  #7 mild anemia: Anemia panel is still pending. No overt bleeding has been noted. Drop in H/H likely in part due to dilution.   Overall patient seems to be doing quite well. Medically she is stable at this time for discharge back to skilled nursing facility to continue rehabilitation.  The above has been discussed with the patient's daughter Donald Prose is phone number is [phone]   PERTINENT LABS  Creatinine is 1.41 today. HbA1c 6.1. Potassium level IV.4. GFR is 44. White cell count was 11.8 yesterday.  IMAGING STUDIES X-ray Chest Pa And Lateral  05/23/2011  *RADIOLOGY REPORT*  Clinical Data: Hypertension, preop  CHEST - 2 VIEW  Comparison: Chest x-ray of 02/06/2010  Findings: There is moderate cardiomegaly present.  There may be mild pulmonary vascular congestion present.  No effusion is seen. Median sternotomy sutures are noted from prior CABG.  No acute bony abnormality  is noted.  IMPRESSION: Cardiomegaly.  Probable mild pulmonary vascular congestion.  Original Report Authenticated By: Joretta Bachelor, M.D.   Dg Chest Port 1 View  05/29/2011   *RADIOLOGY REPORT*  Clinical Data: Postop for vascular surgery  PORTABLE CHEST - 1 VIEW  Comparison: 05/28/2011  Findings: Prior median sternotomy.  Right IJ Cordis sheath is unchanged.  Cardiomegaly accentuated by AP portable technique. Right hemidiaphragm elevation.  Probable small bilateral pleural effusions persist. No pneumothorax.  Low lung volumes with resultant pulmonary interstitial prominence.  Similar mild bibasilar atelectasis.  IMPRESSION: No change in cardiomegaly with low lung volumes and small bilateral pleural effusions/bibasilar atelectasis.  Original Report Authenticated By: Areta Haber, M.D.   Dg Chest Port 1 View  05/28/2011  *RADIOLOGY REPORT*  Clinical Data: Patient removed and tracheal tube yesterday.  PORTABLE CHEST - 1 VIEW  Comparison: 05/27/2011.  Findings: Interval extubation with removal of the right IJ Swan- Ganz catheter and nasogastric tube as well. The right IJ catheter sheath remains in place.  Heart size stable.  Sternal wires are unchanged in position.  Lungs are low in volume with bibasilar air space disease and probable bilateral pleural effusions.  Aeration may have improved slightly in the interval.  IMPRESSION: Low lung volumes with bibasilar air space disease and small bilateral pleural effusions.  Overall aeration may improve slightly in the interval.  Original Report Authenticated By: Luretha Rued, M.D.   Dg Chest Port 1 View  05/27/2011  *RADIOLOGY REPORT*  Clinical Data: Intubated  PORTABLE CHEST - 1 VIEW  Comparison:   the previous day's study  Findings: Endotracheal tube, nasogastric tube, right IJ Swan-Ganz are stable in position.  Previous CABG.  Stable cardiomegaly. Slight improvement in the perihilar edema or infiltrates. Persistent bibasilar consolidation / atelectasis and probable layering effusions.  IMPRESSION:  1.  Slight interval improvement bilateral edema/infiltrates. 2.  Persistent layering pleural effusions. 3. Support hardware stable in  position.  Original Report Authenticated By: Trecia Rogers, M.D.   Dg Chest Port 1 View  05/26/2011  *RADIOLOGY REPORT*  Clinical Data: Airspace disease in the lungs.  PORTABLE CHEST - 1 VIEW  Comparison: 05/25/2011  Findings: Right internal jugular Swan-Ganz catheter tip is in the right pulmonary artery.  The endotracheal tube tip is in the vicinity of the carina, and retraction of 2.5 cm is recommended.  Nasogastric tube extends into the stomach.  Low lung volumes are present, causing crowding of the pulmonary vasculature.  Cardiomegaly is present with worsening aeration in the lungs bilaterally, particularly on the left side where the hemidiaphragm is now completely obscured.  This probably reflects left lower lobe atelectasis, and with a layering pleural effusion cannot be excluded.  Mild perihilar atelectasis on the right is suspected.  Prior CABG noted.  IMPRESSION:  1.  Endotracheal tube tip is malpositioned and in the vicinity of the carina - retraction of 2.5 cm is recommended. 2.  Worsening aeration bilaterally, especially in the left lower lobe. 3.  Cardiomegaly, correlate with Swan-Ganz pressures in assessing for pulmonary venous hypertension.  Original Report Authenticated By: Carron Curie, M.D.   Dg Chest Portable 1 View  05/25/2011  *RADIOLOGY REPORT*  Clinical Data: Line placement  PORTABLE CHEST - 1 VIEW  Comparison: 05/23/2011  Findings: Endotracheal tube with its tip 2.5 cm above the carina. Enteric tube courses below the diaphragm.  Right IJ Swan-Ganz catheter with its tip in the right main pulmonary artery.  Cardiomegaly with pulmonary vascular congestion.  No  frank interstitial edema.  No pleural effusion or pneumothorax.  Postsurgical changes related to prior CABG.  IMPRESSION: Right IJ Swan-Ganz catheter with its tip in the right main pulmonary artery.  No pneumothorax.  Additional support apparatus as above.  Original Report Authenticated By: Julian Hy, M.D.     Discharge Exam: Blood pressure 148/63, pulse 61, temperature 98.4 F (36.9 C), temperature source Oral, resp. rate 17, height 5\' 2"  (1.575 m), weight 101.2 kg (223 lb 1.7 oz), SpO2 95.00%. General appearance: alert, cooperative, appears stated age, no distress and moderately obese Head: Normocephalic, without obvious abnormality, atraumatic Throat: lips, mucosa, and tongue normal; teeth and gums normal Neck: no adenopathy, no carotid bruit, no JVD, supple, symmetrical, trachea midline and thyroid not enlarged, symmetric, no tenderness/mass/nodules Resp: clear to auscultation bilaterally Cardio: regular rate and rhythm, S1, S2 normal, no murmur, click, rub or gallop GI: soft, non-tender; bowel sounds normal; no masses,  no organomegaly Extremities: edema 2+ Pulses: 2+ and symmetric Skin: Skin color, texture, turgor normal. No rashes or lesions Lymph nodes: Cervical, supraclavicular, and axillary nodes normal. Neurologic: Grossly normal  Disposition: [place]  Discharge Orders    Future Appointments: Provider: Department: Dept Phone: Center:   06/18/2011 9:00 AM Mal Misty, MD [facility] [phone] [facility]   06/28/2011 11:00 AM Peter Martinique, MD [facility] [phone] None   10/16/2011 3:30 PM [facility] [phone] [facility]   10/16/2011 4:00 PM Mal Misty, MD [facility] [phone] [facility]     Future Orders Please Complete By Expires   Diet Carb Modified      Increase activity slowly      Discharge instructions      Comments:   SNF to do the following: 1. Check CBG QAC and HS. Follow sliding scale as prescribed. For AM CBG greater than 200 increase the dose of lantus by 2 units each Am and PM. If CBG's are greater than 200 at any other time please call on-call MD for instructions. Once AM CBG reaches less than 200, stop changing dose unless advised by SNF physician. 2. Check Bmet on Monday December 10th to check renal function      Current Discharge Medication List    CONTINUE these medications which have CHANGED   Details  furosemide (LASIX) 20 MG tablet Take 3 tablets (60 mg total) by mouth 2 (two) times daily. Qty: 1 tablet, Refills: 0    !! insulin aspart (NOVOLOG) 100 UNIT/ML injection Inject 0-9 Units into the skin 3 (three) times daily with meals. CBG 70 - 120: 0 units CBG 121 - 150: 1 unit CBG 151 - 200: 2 units CBG 201 - 250: 3 units CBG 251 - 300: 5 units CBG 301 - 350: 7 units and call MD  Qty: 1 vial    !! insulin aspart (NOVOLOG) 100 UNIT/ML injection Inject 0-5 Units into the skin at bedtime. CBG 70 - 120: 0 units CBG 121 - 150: 0 units CBG 151 - 200: 0 units CBG 201 - 250: 2 units CBG 251 - 300: 3 units CBG 301 - 350: 4 units and call MD  Qty: 1 vial    insulin glargine (LANTUS) 100 UNIT/ML injection Inject 6 Units into the skin 2 (two) times daily. Qty: 10 mL    oxyCODONE-acetaminophen (PERCOCET) 5-325 MG per tablet Take 1-2 tablets by mouth every 4 (four) hours as needed. For pain Qty: 15 tablet, Refills: 0    polyethylene glycol (MIRALAX / GLYCOLAX) packet Take 17 g by  mouth daily as needed (for constipation). Qty: 14 each, Refills: 0     !! - Potential duplicate medications found. Please discuss with provider.    CONTINUE these medications which have NOT CHANGED   Details  acetaminophen (TYLENOL) 325 MG tablet Take 1-2 tablets (325-650 mg total) by mouth every 4 (four) hours as needed (or temp >/= 101 F). Qty: 30 tablet, Refills: 0    amLODipine (NORVASC) 10 MG tablet Take 10 mg by mouth daily.      aspirin 325 MG tablet Take 325 mg by mouth daily.      atorvastatin (LIPITOR) 20 MG tablet Take 20 mg by mouth daily.      Calcium Carbonate-Vitamin D (CALCIUM + D PO) Take by mouth daily.      carvedilol (COREG) 6.25 MG tablet Take 3 tablets (18.75 mg total) by mouth 2 (two) times daily with a meal. Qty: 30 tablet, Refills: 0    Coenzyme Q10 200 MG capsule Take 200 mg by  mouth daily.      docusate (COLACE) 50 MG/5ML liquid Take 10 mLs (100 mg total) by mouth daily. Qty: 100 mL, Refills: 0    ferrous sulfate 325 (65 FE) MG tablet Take 1 tablet (325 mg total) by mouth 2 (two) times daily with a meal. Qty: 30 tablet, Refills: 0    fish oil-omega-3 fatty acids 1000 MG capsule Take 1 g by mouth 2 (two) times daily.      hydrALAZINE (APRESOLINE) 25 MG tablet Take 2 tablets (50 mg total) by mouth 3 (three) times daily.    Multiple Vitamin (MULTIVITAMIN) tablet Take 1 tablet by mouth daily.      pantoprazole (PROTONIX) 40 MG tablet Take 1 tablet (40 mg total) by mouth daily at 12 noon. Qty: 30 tablet, Refills: 0      STOP taking these medications     glimepiride (AMARYL) 4 MG tablet      metFORMIN (GLUCOPHAGE) 1000 MG tablet        Follow-up Information    Follow up with SHELTON,KIMBERLY R. in 7 days.         Total Discharge Time: 59mins  Signed: [facility] Pager [phone]  06/14/2011, 12:40 PM

## 2011-06-14 NOTE — Progress Notes (Signed)
06/14/2011 [facility] [place] Case Management Note [id number]    CARE MANAGEMENT NOTE 06/14/2011  Patient:  Norma [name], Norma [name]   Account Number:  [id number]  Date Initiated:  06/14/2011  Documentation initiated by:  Kellie Moor  Subjective/Objective Assessment:   admitted on 06/12/11 with low blood sugar     Action/Plan:   prior to admission, patient was a resident at [facility]   Anticipated DC Date:  06/14/2011   Anticipated DC Plan:  [place]  CM consult      Choice offered to / List presented to:             Status of service:  Completed, signed off Medicare Important Message given?   (If response is "NO", the following Medicare IM given date fields will be blank) Date Medicare IM given:   Date Additional Medicare IM given:    Discharge Disposition:  [place]  Per UR Regulation:  Reviewed for med. necessity/level of care/duration of stay  Comments:  PCP: Heath Gold  Contact: Nakai Malsch (daughter) [phone]  06/14/11-1053-J.Bertram Savin  [id number]      65yo female admitted on 06/12/11 with c/o low blood sugar. Prior to admission, patient was a resident at [facility]. Patient being discharged today to return to [facility]. No further discharge needs identified.

## 2011-06-14 NOTE — Progress Notes (Signed)
Patient transported to Nursing home via ambulance Norma [name], Cedric Fishman

## 2011-06-14 NOTE — Progress Notes (Signed)
Inpatient Diabetes Program Recommendations  AACE/ADA: New Consensus Statement on Inpatient Glycemic Control (2009)  Target Ranges:  Prepandial:   less than 140 mg/dL      Peak postprandial:   less than 180 mg/dL (1-2 hours)      Critically ill patients:  140 - 180 mg/dL   CBGs 12/05: 176/ 330/ 398/ 387 mg/dl  Inpatient Diabetes Program Recommendations Insulin - Basal: Titrate Lantus as needed- Started Lantus 6 units QHS last pm Correction (SSI): Please increase Novolog SSI to Moderate scale. Insulin - Meal Coverage: -

## 2011-06-14 NOTE — Progress Notes (Signed)
Utilization review completed. 06/14/2011 Norma [name].

## 2011-06-15 ENCOUNTER — Encounter: Payer: Self-pay | Admitting: Vascular Surgery

## 2011-06-18 ENCOUNTER — Encounter: Payer: Self-pay | Admitting: Vascular Surgery

## 2011-06-18 ENCOUNTER — Ambulatory Visit (INDEPENDENT_AMBULATORY_CARE_PROVIDER_SITE_OTHER): Payer: Medicare Other | Admitting: Vascular Surgery

## 2011-06-18 VITALS — BP 157/47 | HR 67 | Resp 16 | Ht 62.0 in | Wt 240.0 lb

## 2011-06-18 DIAGNOSIS — I739 Peripheral vascular disease, unspecified: Secondary | ICD-10-CM

## 2011-06-18 NOTE — Progress Notes (Signed)
Subjective:     Patient ID: Norma [name], female   DOB: Dec 27, 1945, 65 y.o.   MRN: [id number]  HPI this 65 year old female returns for initial followup regarding her complex aortic procedure performed on November 16. She had a suprarenal aortic endarterectomy with insertion of an aortobifemoral bypass graft. She had plaque extending into the origin of both renal arteries which was endarterectomized. She did have some renal dysfunction postoperatively with a creatinine increasing to almost 4 but had decreased to 1.8 by the time of discharge. She had lab work today which is pending. She states she has been making plenty of urine at [facility] where she is now located. She is working with physical therapy to increase her ambulation. She had undergone coronary artery bypass grafting about 18 months previously   Review of Systems     Objective:   Physical ExamBP 157/47  Pulse 67  Resp 16  Ht 5\' 2"  (1.575 m)  Wt 240 lb (108.863 kg)  BMI 43.90 kg/m2 Generally she is alert and oriented x3 in no apparent distress Chest clear no rhonchi or wheezing Abdomen obese midline incision has healed amazingly well. All staples were removed. Both inguinal incision are also well healed. She has 2-3+ femoral pulses palpable bilaterally. She does have 2+ edema in both lower extremities.    Assessment:     Doing well post aortobifemoral bypass with suprarenal aortic endarterectomy. Laboratory values today are pending including renal function Incisions healing adequately and she is progressing with physical therapy at [facility] May well need an increase in diuretic therapy     Plan:    patient should see Dr. Willey Blade and Dr. Peters Martinique in followup regarding possible diuretic therapy Needs local wound care to keep incisions dry Continued to progress with physical therapy Return to see me in 2 months for further followup

## 2011-06-19 ENCOUNTER — Ambulatory Visit: Payer: Medicare Other | Admitting: Vascular Surgery

## 2011-06-28 ENCOUNTER — Ambulatory Visit (INDEPENDENT_AMBULATORY_CARE_PROVIDER_SITE_OTHER): Payer: Medicare Other | Admitting: Cardiology

## 2011-06-28 ENCOUNTER — Encounter: Payer: Self-pay | Admitting: Cardiology

## 2011-06-28 VITALS — BP 130/50 | HR 60 | Ht 62.0 in | Wt 235.0 lb

## 2011-06-28 DIAGNOSIS — I2510 Atherosclerotic heart disease of native coronary artery without angina pectoris: Secondary | ICD-10-CM

## 2011-06-28 DIAGNOSIS — I10 Essential (primary) hypertension: Secondary | ICD-10-CM

## 2011-06-28 DIAGNOSIS — R609 Edema, unspecified: Secondary | ICD-10-CM

## 2011-06-28 DIAGNOSIS — Z951 Presence of aortocoronary bypass graft: Secondary | ICD-10-CM

## 2011-06-28 DIAGNOSIS — N189 Chronic kidney disease, unspecified: Secondary | ICD-10-CM

## 2011-06-28 NOTE — Patient Instructions (Addendum)
Reduce Norvasc to 5 mg daily.  Increase Lasix to 120 mg twice a day.   Restrict sodium intake.  Weigh daily and record.  We will schedule you for an echocardiogram  We will see you in 2 weeks.   Will get lab work in 1 week- December 27- COME AFTER 8:30 ;DO NOT NEED TO BE FASTING

## 2011-06-29 DIAGNOSIS — R609 Edema, unspecified: Secondary | ICD-10-CM | POA: Insufficient documentation

## 2011-06-29 NOTE — Assessment & Plan Note (Signed)
I think her edema is multifactorial. It is still related to volume overload from her surgery and acute renal dysfunction. Maybe exacerbated by her Norvasc. I recommended sodium restriction. I have asked that she be weighed daily. We will increase her Lasix to 120 mg twice a day and decrease her Norvasc to 5 mg daily. We will have her return in 2 weeks for followup. We will recheck a basic metabolic panel in one week. I doubt that this is predominantly congestive heart failure but we will update an echocardiogram and compare with her last study in May of 2011.

## 2011-06-29 NOTE — Progress Notes (Signed)
Norma [name] Date of Birth: 1945/11/05   History of Present Illness: Norma [name] is seen for followup today. She has a history of coronary disease and is status post CABG in June of 2011. This included an LIMA graft to the LAD and a saphenous vein graft to the obtuse marginal vessel. She has known severe peripheral vascular disease with extensive aortic obstruction and iliac disease. She also had moderate bilateral renal artery disease.  Since her last visit here she was hospitalized from November 16 to the 30th. She underwent complex vascular surgery including suprarenal aortic endarterectomy with aortobifemoral bypass grafting and bilateral renal artery endarterectomies. Postoperatively she developed significant renal dysfunction with creatinine up to 4. This did improve and at the time of discharge was down to 1.8. She developed significant lower extremity edema which has persisted. She was recently admitted for 2 days on December 4 with severe hypoglycemia. She is now staying at [facility]. Her major complaint is of her edema. Her legs feel tight. She denies any chest pain or shortness of breath. She has developed some numbness in her right hand in an ulnar distribution. She is eating better now.  Current Outpatient Prescriptions on File Prior to Visit  Medication Sig Dispense Refill  . amLODipine (NORVASC) 10 MG tablet Take 10 mg by mouth daily.        Marland Kitchen aspirin 325 MG tablet Take 325 mg by mouth daily.        Marland Kitchen atorvastatin (LIPITOR) 20 MG tablet Take 20 mg by mouth daily.        . Calcium Carbonate-Vitamin D (CALCIUM + D PO) Take by mouth daily.        . carvedilol (COREG) 6.25 MG tablet Take 3 tablets (18.75 mg total) by mouth 2 (two) times daily with a meal.  30 tablet  0  . Coenzyme Q10 200 MG capsule Take 200 mg by mouth daily.        . ferrous sulfate 325 (65 FE) MG tablet Take 1 tablet (325 mg total) by mouth 2 (two) times daily with a meal.  30 tablet  0    . fish oil-omega-3 fatty acids 1000 MG capsule Take 1 g by mouth 2 (two) times daily.        . furosemide (LASIX) 20 MG tablet Take 3 tablets (60 mg total) by mouth 2 (two) times daily.  1 tablet  0  . hydrALAZINE (APRESOLINE) 25 MG tablet Take 2 tablets (50 mg total) by mouth 3 (three) times daily.      . insulin aspart (NOVOLOG) 100 UNIT/ML injection Inject 0-9 Units into the skin 3 (three) times daily with meals. CBG 70 - 120: 0 units CBG 121 - 150: 1 unit CBG 151 - 200: 2 units CBG 201 - 250: 3 units CBG 251 - 300: 5 units CBG 301 - 350: 7 units and call MD   1 vial    . insulin aspart (NOVOLOG) 100 UNIT/ML injection Inject 0-5 Units into the skin at bedtime. CBG 70 - 120: 0 units CBG 121 - 150: 0 units CBG 151 - 200: 0 units CBG 201 - 250: 2 units CBG 251 - 300: 3 units CBG 301 - 350: 4 units and call MD   1 vial    . insulin glargine (LANTUS) 100 UNIT/ML injection Inject 6 Units into the skin 2 (two) times daily.  10 mL    . Multiple Vitamin (MULTIVITAMIN) tablet Take 1 tablet by mouth  daily.        . pantoprazole (PROTONIX) 40 MG tablet Take 1 tablet (40 mg total) by mouth daily at 12 noon.  30 tablet  0    Allergies  Allergen Reactions  . Iohexol      Code: RASH, Desc: pt called 1 day post scanning stating that skin was red and "itching all over" some what better but still had symptom.. instructed pt to take benadryl to relieve symptoms,per dr Alvester Chou.if any problems call back/mms, Onset Date: [id number]   . Sulfa Antibiotics     Past Medical History  Diagnosis Date  . Claudication   . Diabetes mellitus   . Hypertension   . Hyperlipidemia   . DJD (degenerative joint disease)   . PAD (peripheral artery disease)   . Coronary artery disease   . Leg pain   . Carotid artery occlusion   . Obesity   . DDD (degenerative disc disease)   . DJD (degenerative joint disease)   . Shortness of breath     exertion  . Anxiety   . Diabetic coma     Past Surgical History   Procedure Date  . Cardiac catheterization 12/01/2009    EF 65%  . Removal of fibrous cyst from right breast 10+ YEARS  . Retinal detachment surgery 10+ YEARS    LEFT EYE  . Coronary artery bypass graft 12/08/2009    LIMA GRAFT TO THE DISTAL LAD AND SAPHENOUS VEIN GRAFT TO THE OBTUSE MARGINAL VESSEL  . Transthoracic echocardiogram 12/01/2009    EF 60-65%  . Cardiovascular stress test 11/28/2009    EF 75%  . Aorta - bilateral femoral artery bypass graft 05/25/2011    Procedure: AORTA BIFEMORAL BYPASS GRAFT;  Surgeon: Mal Misty, MD;  Location: [facility];  Service: Vascular;  Laterality: N/A;    History  Smoking status  . Former Smoker -- 20 years  . Types: Cigarettes  . Quit date: 07/10/1991  Smokeless tobacco  . Never Used    History  Alcohol Use No    Family History  Problem Relation Age of Onset  . Hypertension Mother   . Coronary artery disease Father   . Hypertension Sister   . Cirrhosis Brother   . Kidney disease Daughter     Review of Systems: As noted in history of present illness.  All other systems were reviewed and are negative.  Physical Exam: BP 130/50  Pulse 60  Ht 5\' 2"  (1.575 m)  Wt 235 lb (106.595 kg)  BMI 42.98 kg/m2 She is a morbidly obese black female in no acute distress. She is normocephalic, atraumatic. Pupils are equal round and reactive. Sclera clear. Oropharynx is clear. Neck is supple no JVD, adenopathy, thyromegaly, or bruits. There are no subclavian bruits. Lungs are clear. Cardiac exam reveals a regular rate and rhythm without gallop, murmur, or click. Abdomen is obese, soft, nontender. Surgical scars are healing well. She has a large pannus. There are no masses. Feet are warm and dry. She has 2-3+ edema.. Skin is warm and dry. Neurologic exam she is alert and oriented x3. Cranial nerves II through XII are intact. She has no focal findings. LABORATORY DATA:   Assessment / Plan:

## 2011-06-29 NOTE — Assessment & Plan Note (Signed)
Blood pressure has improved significantly with renal revascularization. She is no longer on ACE inhibitors or Tekturna  because of her renal insufficiency.

## 2011-07-05 ENCOUNTER — Ambulatory Visit (HOSPITAL_COMMUNITY): Payer: Medicare Other | Attending: Cardiology

## 2011-07-05 ENCOUNTER — Other Ambulatory Visit (INDEPENDENT_AMBULATORY_CARE_PROVIDER_SITE_OTHER): Payer: Medicare Other | Admitting: *Deleted

## 2011-07-05 DIAGNOSIS — I059 Rheumatic mitral valve disease, unspecified: Secondary | ICD-10-CM | POA: Insufficient documentation

## 2011-07-05 DIAGNOSIS — I079 Rheumatic tricuspid valve disease, unspecified: Secondary | ICD-10-CM | POA: Insufficient documentation

## 2011-07-05 DIAGNOSIS — N189 Chronic kidney disease, unspecified: Secondary | ICD-10-CM

## 2011-07-05 DIAGNOSIS — I2510 Atherosclerotic heart disease of native coronary artery without angina pectoris: Secondary | ICD-10-CM

## 2011-07-05 DIAGNOSIS — R609 Edema, unspecified: Secondary | ICD-10-CM | POA: Insufficient documentation

## 2011-07-05 LAB — BASIC METABOLIC PANEL
CO2: 29 mEq/L (ref 19–32)
Calcium: 9 mg/dL (ref 8.4–10.5)
Creatinine, Ser: 1.6 mg/dL — ABNORMAL HIGH (ref 0.4–1.2)
Glucose, Bld: 148 mg/dL — ABNORMAL HIGH (ref 70–99)

## 2011-07-06 NOTE — Progress Notes (Signed)
lmtcb 12/28

## 2011-07-09 ENCOUNTER — Telehealth: Payer: Self-pay | Admitting: Cardiology

## 2011-07-09 NOTE — Telephone Encounter (Signed)
Fu call Pt returning your call about test results

## 2011-07-13 ENCOUNTER — Encounter: Payer: Self-pay | Admitting: Nurse Practitioner

## 2011-07-13 ENCOUNTER — Ambulatory Visit (INDEPENDENT_AMBULATORY_CARE_PROVIDER_SITE_OTHER): Payer: Medicare Other | Admitting: Nurse Practitioner

## 2011-07-13 VITALS — BP 140/50 | HR 58 | Ht 62.0 in | Wt 222.2 lb

## 2011-07-13 DIAGNOSIS — N189 Chronic kidney disease, unspecified: Secondary | ICD-10-CM

## 2011-07-13 DIAGNOSIS — I2510 Atherosclerotic heart disease of native coronary artery without angina pectoris: Secondary | ICD-10-CM

## 2011-07-13 DIAGNOSIS — I10 Essential (primary) hypertension: Secondary | ICD-10-CM

## 2011-07-13 DIAGNOSIS — R609 Edema, unspecified: Secondary | ICD-10-CM

## 2011-07-13 LAB — BASIC METABOLIC PANEL
BUN: 31 mg/dL — ABNORMAL HIGH (ref 6–23)
CO2: 31 mEq/L (ref 19–32)
Calcium: 8.9 mg/dL (ref 8.4–10.5)
Chloride: 103 mEq/L (ref 96–112)
Creatinine, Ser: 1.3 mg/dL — ABNORMAL HIGH (ref 0.4–1.2)
GFR: 53.71 mL/min — ABNORMAL LOW (ref >60.00)
Glucose, Bld: 150 mg/dL — ABNORMAL HIGH (ref 70–99)
Potassium: 3.4 mEq/L — ABNORMAL LOW (ref 3.5–5.1)
Sodium: 144 mEq/L (ref 135–145)

## 2011-07-13 MED ORDER — CARVEDILOL 25 MG PO TABS: 25.0000 mg | ORAL_TABLET | Freq: Two times a day (BID) | ORAL | Status: DC

## 2011-07-13 NOTE — Assessment & Plan Note (Signed)
Rechecking labs today.

## 2011-07-13 NOTE — Progress Notes (Signed)
Norma [name] Date of Birth: January 15, 1946 Medical Record [id number]  History of Present Illness: Norma [name] is seen today for a follow up visit. She is seen for Dr. Martinique. When she was here 2 weeks ago, he increased her Lasix, cut back the Norvasc and updated her echo. She has had issues with volume overload. There was concern for heart failure. Her echo shows normal systolic function with some diastolic dysfunction secondary to her LVH. Valves were ok. She remains at [facility] for her rehab. She had complex vascular surgery in November and was slow to recover.  She comes in today. She is in a wheelchair. She is doing much better. Weight is down 13 pounds. Legs still feel tight but the swelling has improved. Her medication list still had Norvasc 10 mg and only 60 mg of Lasix BID. She continues to work diligently with rehab. May be going home in about a week or so. Her daughter lives with her, so she will have help. No chest pain. She thinks she is doing well.   Current Outpatient Prescriptions on File Prior to Visit  Medication Sig Dispense Refill  . amLODipine (NORVASC) 10 MG tablet Take 10 mg by mouth daily.        Marland Kitchen aspirin 325 MG tablet Take 325 mg by mouth daily.        Marland Kitchen atorvastatin (LIPITOR) 20 MG tablet Take 20 mg by mouth daily.        . Calcium Carbonate-Vitamin D (CALCIUM + D PO) Take by mouth daily.        . carvedilol (COREG) 6.25 MG tablet Take 3 tablets (18.75 mg total) by mouth 2 (two) times daily with a meal.  30 tablet  0  . Coenzyme Q10 200 MG capsule Take 200 mg by mouth daily.        Marland Kitchen docusate sodium (COLACE) 100 MG capsule Take 100 mg by mouth daily.        . ferrous sulfate 325 (65 FE) MG tablet Take 1 tablet (325 mg total) by mouth 2 (two) times daily with a meal.  30 tablet  0  . fish oil-omega-3 fatty acids 1000 MG capsule Take 1 g by mouth 2 (two) times daily.        . furosemide (LASIX) 20 MG tablet Take 3 tablets (60 mg total) by mouth 2 (two) times daily.  1  tablet  0  . hydrALAZINE (APRESOLINE) 25 MG tablet Take 2 tablets (50 mg total) by mouth 3 (three) times daily.      . insulin aspart (NOVOLOG) 100 UNIT/ML injection Inject 0-9 Units into the skin 3 (three) times daily with meals. CBG 70 - 120: 0 units CBG 121 - 150: 1 unit CBG 151 - 200: 2 units CBG 201 - 250: 3 units CBG 251 - 300: 5 units CBG 301 - 350: 7 units and call MD   1 vial    . insulin aspart (NOVOLOG) 100 UNIT/ML injection Inject 0-5 Units into the skin at bedtime. CBG 70 - 120: 0 units CBG 121 - 150: 0 units CBG 151 - 200: 0 units CBG 201 - 250: 2 units CBG 251 - 300: 3 units CBG 301 - 350: 4 units and call MD   1 vial    . insulin glargine (LANTUS) 100 UNIT/ML injection Inject 6 Units into the skin 2 (two) times daily.  10 mL    . Multiple Vitamin (MULTIVITAMIN) tablet Take 1 tablet by mouth daily.        Marland Kitchen  oxyCODONE-acetaminophen (PERCOCET) 5-325 MG per tablet Take 1 tablet by mouth every 4 (four) hours as needed.        . pantoprazole (PROTONIX) 40 MG tablet Take 1 tablet (40 mg total) by mouth daily at 12 noon.  30 tablet  0  . polyethylene glycol (MIRALAX / GLYCOLAX) packet Take 17 g by mouth as needed.          Allergies  Allergen Reactions  . Iohexol      Code: RASH, Desc: pt called 1 day post scanning stating that skin was red and "itching all over" some what better but still had symptom.. instructed pt to take benadryl to relieve symptoms,per dr Alvester Chou.if any problems call back/mms, Onset Date: [id number]   . Sulfa Antibiotics     Past Medical History  Diagnosis Date  . Claudication   . Diabetes mellitus   . Hypertension   . Hyperlipidemia   . DJD (degenerative joint disease)   . PAD (peripheral artery disease)   . Coronary artery disease   . Leg pain   . Carotid artery occlusion   . Obesity   . DDD (degenerative disc disease)   . DJD (degenerative joint disease)   . Shortness of breath     exertion  . Anxiety   . Diabetic coma     Past Surgical  History  Procedure Date  . Cardiac catheterization 12/01/2009    EF 65%  . Removal of fibrous cyst from right breast 10+ YEARS  . Retinal detachment surgery 10+ YEARS    LEFT EYE  . Coronary artery bypass graft 12/08/2009    LIMA GRAFT TO THE DISTAL LAD AND SAPHENOUS VEIN GRAFT TO THE OBTUSE MARGINAL VESSEL  . Transthoracic echocardiogram 12/01/2009    EF 60-65%  . Cardiovascular stress test 11/28/2009    EF 75%  . Aorta - bilateral femoral artery bypass graft 05/25/2011    Procedure: AORTA BIFEMORAL BYPASS GRAFT;  Surgeon: Mal Misty, MD;  Location: [place];  Service: Vascular;  Laterality: N/A;    History  Smoking status  . Former Smoker -- 20 years  . Types: Cigarettes  . Quit date: 07/10/1991  Smokeless tobacco  . Never Used    History  Alcohol Use No    Family History  Problem Relation Age of Onset  . Hypertension Mother   . Coronary artery disease Father   . Hypertension Sister   . Cirrhosis Brother   . Kidney disease Daughter     Review of Systems: The review of systems is positive for edema.  All other systems were reviewed and are negative.  Physical Exam: BP 140/50  Pulse 58  Ht 5\' 2"  (1.575 m)  Wt 222 lb 3.2 oz (100.789 kg)  BMI 40.64 kg/m2 Patient is very pleasant and in no acute distress. She is morbidly obese. She is in a wheelchair. Skin is warm and dry. Color is normal.  HEENT is unremarkable. Normocephalic/atraumatic. PERRL. Sclera are nonicteric. Neck is supple. No masses. No JVD. Lungs are clear. Cardiac exam shows a regular rate and rhythm. Heart tones are distant. Abdomen is soft. Extremities are still tight with 3+ edema. Gait is not tested. ROM is intact. No gross neurologic deficits noted.   Lab Results  Component Value Date   WBC 11.8* 06/13/2011   HGB 8.8* 06/13/2011   HCT 28.8* 06/13/2011   PLT 439* 06/13/2011   GLUCOSE 148* 07/05/2011   CHOL  Value: 265        ATP  III CLASSIFICATION:  <200     mg/dL   Desirable  200-239  mg/dL    Borderline High  >=240    mg/dL   High* 03/19/2007   TRIG 304* 03/19/2007   HDL 48 03/19/2007   LDLCALC  Value: 156        Total Cholesterol/HDL:CHD Risk Coronary Heart Disease Risk Table                     Men   Women  1/2 Average Risk   3.4   3.3* 03/19/2007   ALT 15 06/12/2011   AST 29 06/12/2011   NA 142 07/05/2011   K 3.8 07/05/2011   CL 101 07/05/2011   CREATININE 1.6* 07/05/2011   BUN 26* 07/05/2011   CO2 29 07/05/2011   TSH 1.994 02/20/2011   INR 1.20 05/28/2011   HGBA1C 6.1* 06/13/2011     Assessment / Plan:

## 2011-07-13 NOTE — Assessment & Plan Note (Signed)
Her weight is down. We will continue with the current dose of Lasix. Norvasc is stopped altogether. She tries to elevate her legs. Not really able to get support stockings on yet. We will recheck a BMET today. She has normal systolic function per recent echo with diastolic dysfunction. I think overall, she is doing exceptionally well. I will see her back in 2 weeks. Patient is agreeable to this plan and will call if any problems develop in the interim.

## 2011-07-13 NOTE — Assessment & Plan Note (Signed)
Will go ahead and increase the Coreg to 25 mg BID. Norvasc has been stopped.

## 2011-07-13 NOTE — Assessment & Plan Note (Signed)
No chest pain

## 2011-07-13 NOTE — Patient Instructions (Signed)
We are stopping the Amlodipine  We are going to increase the Coreg to 25 mg two times a day.  Continue with the other medicines  We will see you back in 2 weeks.  Call the [facility] office at [phone] if you have any questions, problems or concerns.

## 2011-07-17 ENCOUNTER — Telehealth: Payer: Self-pay | Admitting: Nurse Practitioner

## 2011-07-17 NOTE — Telephone Encounter (Signed)
LOV x2,Echo,12 lead faxed to [facility] @ [phone] 07/17/11/KM

## 2011-07-27 ENCOUNTER — Ambulatory Visit: Payer: Medicare Other | Admitting: Nurse Practitioner

## 2011-08-01 ENCOUNTER — Ambulatory Visit (INDEPENDENT_AMBULATORY_CARE_PROVIDER_SITE_OTHER): Payer: Medicare Other | Admitting: Nurse Practitioner

## 2011-08-01 ENCOUNTER — Encounter: Payer: Self-pay | Admitting: Nurse Practitioner

## 2011-08-01 VITALS — BP 118/62 | HR 72 | Ht 62.0 in | Wt 213.0 lb

## 2011-08-01 DIAGNOSIS — R609 Edema, unspecified: Secondary | ICD-10-CM

## 2011-08-01 DIAGNOSIS — I10 Essential (primary) hypertension: Secondary | ICD-10-CM

## 2011-08-01 LAB — BASIC METABOLIC PANEL
BUN: 23 mg/dL (ref 6–23)
CO2: 27 mEq/L (ref 19–32)
Calcium: 9 mg/dL (ref 8.4–10.5)
Chloride: 104 mEq/L (ref 96–112)
Creatinine, Ser: 1.1 mg/dL (ref 0.4–1.2)
GFR: 62.65 mL/min (ref >60.00)
Glucose, Bld: 209 mg/dL — ABNORMAL HIGH (ref 70–99)
Potassium: 3.9 mEq/L (ref 3.5–5.1)
Sodium: 142 mEq/L (ref 135–145)

## 2011-08-01 NOTE — Patient Instructions (Signed)
I think you are making great progress.  We are going to check your lab today to look at your potassium level.   Stay on your current medicines.  We will see you in a month.  Try to get some medium support stockings at the [employer] store on [address] (corner of [address] and [address] in the shopping center)  Call the [facility] office at [phone] if you have any questions, problems or concerns.

## 2011-08-01 NOTE — Progress Notes (Signed)
Norma [name] Date of Birth: 1945/07/12 Medical Record [id number]  History of Present Illness: [name] is seen back today for a 2 week check. She is seen for Dr. Martin[place]. She had complex vascular surgery in November and was slow to recover. She did require a stay at [facility]. She has been discharged home on the 15th of the month. She does have home health in place. She has had significant volume overload and has been diuresed. She has some diastolic dystfunction with a normal EF. Her medicine list from [place] does not match with what we have. She says what we have listed below is what she is currently taking except for the potassium. She has not been on any potassium for the past week.Was not given a RX from [place]. She remains on Lasix 60 mg BID.  She comes in today. She is now using a walker and not the wheelchair. She is doing very well. Weight continues to drop. She has less swelling. She does note some stinging and burning in her feet. No open sores. No chest pain. She thinks she might be able to put on some support stockings.   Current Outpatient Prescriptions on File Prior to Visit  Medication Sig Dispense Refill  . aspirin 325 MG tablet Take 325 mg by mouth daily.        Marland Kitchen atorvastatin (LIPITOR) 20 MG tablet Take 20 mg by mouth daily.        . Calcium Carbonate-Vitamin D (CALCIUM + D PO) Take by mouth daily.        . carvedilol (COREG) 25 MG tablet Take 1 tablet (25 mg total) by mouth 2 (two) times daily.  60 tablet  11  . Coenzyme Q10 200 MG capsule Take 200 mg by mouth daily.        Marland Kitchen docusate sodium (COLACE) 100 MG capsule Take 100 mg by mouth daily.        . ferrous sulfate 325 (65 FE) MG tablet Take 1 tablet (325 mg total) by mouth 2 (two) times daily with a meal.  30 tablet  0  . fish oil-omega-3 fatty acids 1000 MG capsule Take 1 g by mouth 2 (two) times daily.        . furosemide (LASIX) 20 MG tablet Take 3 tablets (60 mg total) by mouth 2 (two) times daily.  1 tablet  0  .  insulin aspart (NOVOLOG) 100 UNIT/ML injection Inject 0-9 Units into the skin 3 (three) times daily with meals. CBG 70 - 120: 0 units CBG 121 - 150: 1 unit CBG 151 - 200: 2 units CBG 201 - 250: 3 units CBG 251 - 300: 5 units CBG 301 - 350: 7 units and call MD   1 vial    . insulin aspart (NOVOLOG) 100 UNIT/ML injection Inject 0-5 Units into the skin at bedtime. CBG 70 - 120: 0 units CBG 121 - 150: 0 units CBG 151 - 200: 0 units CBG 201 - 250: 2 units CBG 251 - 300: 3 units CBG 301 - 350: 4 units and call MD   1 vial    . insulin glargine (LANTUS) 100 UNIT/ML injection Inject 6 Units into the skin 2 (two) times daily.  10 mL    . Multiple Vitamin (MULTIVITAMIN) tablet Take 1 tablet by mouth daily.        Marland Kitchen oxyCODONE-acetaminophen (PERCOCET) 5-325 MG per tablet Take 1 tablet by mouth every 4 (four) hours as needed.        Marland Kitchen  polyethylene glycol (MIRALAX / GLYCOLAX) packet Take 17 g by mouth as needed.        Marland Kitchen acetaminophen (TYLENOL) 325 MG tablet Take 1-2 tablets (325-650 mg total) by mouth every 4 (four) hours as needed (or temp >/= 101 F).  30 tablet  0  . docusate (COLACE) 50 MG/5ML liquid Take 10 mLs (100 mg total) by mouth daily.  100 mL  0  . oxyCODONE-acetaminophen (PERCOCET) 5-325 MG per tablet Take 1-2 tablets by mouth every 4 (four) hours as needed. For pain  15 tablet  0  . polyethylene glycol (MIRALAX / GLYCOLAX) packet Take 17 g by mouth daily as needed.  14 each  0  . polyethylene glycol (MIRALAX / GLYCOLAX) packet Take 17 g by mouth daily as needed (for constipation).  14 each  0  . potassium chloride SA (K-DUR,KLOR-CON) 20 MEQ tablet Take 20 mEq by mouth daily.      Marland Kitchen DISCONTD: carvedilol (COREG) 6.25 MG tablet Take 3 tablets (18.75 mg total) by mouth 2 (two) times daily with a meal.  30 tablet  0  . DISCONTD: hydrALAZINE (APRESOLINE) 25 MG tablet Take 2 tablets (50 mg total) by mouth 3 (three) times daily.        Allergies  Allergen Reactions  . Iohexol      Code: RASH,  Desc: pt called 1 day post scanning stating that skin was red and "itching all over" some what better but still had symptom.. instructed pt to take benadryl to relieve symptoms,per dr Alvester Chou.if any problems call back/mms, Onset Date: [id number]   . Sulfa Antibiotics     Past Medical History  Diagnosis Date  . Claudication   . Diabetes mellitus   . Hypertension   . Hyperlipidemia   . DJD (degenerative joint disease)   . PAD (peripheral artery disease)   . Coronary artery disease   . Leg pain   . Carotid artery occlusion   . Obesity   . DDD (degenerative disc disease)   . DJD (degenerative joint disease)   . Shortness of breath     exertion  . Anxiety   . Diabetic coma   . Edema     Past Surgical History  Procedure Date  . Cardiac catheterization 12/01/2009    EF 65%  . Removal of fibrous cyst from right breast 10+ YEARS  . Retinal detachment surgery 10+ YEARS    LEFT EYE  . Coronary artery bypass graft 12/08/2009    LIMA GRAFT TO THE DISTAL LAD AND SAPHENOUS VEIN GRAFT TO THE OBTUSE MARGINAL VESSEL  . Transthoracic echocardiogram 12/01/2009    EF 60-65%  . Cardiovascular stress test 11/28/2009    EF 75%  . Aorta - bilateral femoral artery bypass graft 05/25/2011    Procedure: AORTA BIFEMORAL BYPASS GRAFT;  Surgeon: Mal Misty, MD;  Location: [place];  Service: Vascular;  Laterality: N/A;    History  Smoking status  . Former Smoker -- 20 years  . Types: Cigarettes  . Quit date: 07/10/1991  Smokeless tobacco  . Never Used    History  Alcohol Use No    Family History  Problem Relation Age of Onset  . Hypertension Mother   . Coronary artery disease Father   . Hypertension Sister   . Cirrhosis Brother   . Kidney disease Daughter     Review of Systems: The review of systems is per the HPI.  All other systems were reviewed and are negative.  Physical Exam: BP  118/62  Pulse 72  Ht 5\' 2"  (1.575 m)  Wt 213 lb (96.616 kg)  BMI 38.96 kg/m2 Patient is very  pleasant and in no acute distress. Her weight is down another 9 pounds. She looks stronger. She is fairly steady on her feet.  Skin is warm and dry. Color is normal.  HEENT is unremarkable. Normocephalic/atraumatic. PERRL. Sclera are nonicteric. Neck is supple. No masses. No JVD. Lungs are clear. Cardiac exam shows a regular rate and rhythm. Heart tones are distant. Abdomen is obese but soft. Extremities are with 1 to 2+ edema which is improved. She has some brawny stasis changes. No open ulcers noted. Gait and ROM are intact. She is using a walker. No gross neurologic deficits noted.   LABORATORY DATA: BMET is pending  Assessment / Plan:

## 2011-08-01 NOTE — Assessment & Plan Note (Signed)
She continues to make very good progress. Her weight continues to drop. Her swelling continues to improve. She is no longer on the Norvasc and her blood pressure is fine. We will be rechecking a BMET today. She has been trying to eat more foods higher in K. If needed, she requests the packet of KCL and not the tablet. She is going to try and get some knee high support stockings. I will see her in a month. Patient is agreeable to this plan and will call if any problems develop in the interim.

## 2011-08-01 NOTE — Assessment & Plan Note (Signed)
Blood pressure looks good with the discontinuation of Norvasc and the increase of Coreg.

## 2011-08-02 ENCOUNTER — Telehealth: Payer: Self-pay | Admitting: Cardiology

## 2011-08-02 NOTE — Telephone Encounter (Signed)
New Problem:     Patient called in because she was seen by Cecille Rubin yesterday had some blood work drawn.  She stated that Cecille Rubin was supposed to call her back and tell her if she needed to take potassium or not. Please call back.

## 2011-08-02 NOTE — Telephone Encounter (Signed)
Pt was notified of results and no need for potassium supplement per Truitt Merle.

## 2011-08-02 NOTE — Telephone Encounter (Signed)
N/A.  LMTC. 

## 2011-08-07 ENCOUNTER — Telehealth: Payer: Self-pay | Admitting: Cardiology

## 2011-08-07 NOTE — Telephone Encounter (Signed)
Patient called stating she is having burning, stinging sensation in both legs.States is noticing small sores starting on lower legs.Spoke with Truitt Merle NP she advised to see PCP.Keep appointment 08/31/11.

## 2011-08-07 NOTE — Telephone Encounter (Signed)
New problem She is calling about her legs swelling. She said she has sores and it stings. She wants to know what she can do please call her back

## 2011-08-13 ENCOUNTER — Encounter: Payer: Self-pay | Admitting: Vascular Surgery

## 2011-08-14 ENCOUNTER — Other Ambulatory Visit (INDEPENDENT_AMBULATORY_CARE_PROVIDER_SITE_OTHER): Payer: Medicare Other | Admitting: *Deleted

## 2011-08-14 ENCOUNTER — Encounter (INDEPENDENT_AMBULATORY_CARE_PROVIDER_SITE_OTHER): Payer: Medicare Other | Admitting: *Deleted

## 2011-08-14 ENCOUNTER — Ambulatory Visit (INDEPENDENT_AMBULATORY_CARE_PROVIDER_SITE_OTHER): Payer: Medicare Other | Admitting: Vascular Surgery

## 2011-08-14 ENCOUNTER — Encounter: Payer: Self-pay | Admitting: Vascular Surgery

## 2011-08-14 VITALS — BP 194/73 | HR 52 | Resp 16 | Ht 62.0 in | Wt 216.0 lb

## 2011-08-14 DIAGNOSIS — I739 Peripheral vascular disease, unspecified: Secondary | ICD-10-CM

## 2011-08-14 DIAGNOSIS — Z48812 Encounter for surgical aftercare following surgery on the circulatory system: Secondary | ICD-10-CM

## 2011-08-14 NOTE — Progress Notes (Signed)
Subjective:     Patient ID: Norma [name], female   DOB: Sep 18, 1945, 66 y.o.   MRN: [id number]  HPI this 66 year old female returns for continued followup regarding her aortobifemoral bypass graft which I inserted in November of 2012. She also had an endarterectomy of her suprarenal aorta and the orifice of both renal arteries. She has done very well since her surgery with good healing of her incisions. She has been discharged from [facility] and returned home. She is increasing her ambulation daily. She is no longer using home physical therapy. She has had some edema in both ankle areas and some rash which is being treated by her medical doctor.   Review of Systems     Objective:   Physical ExamBP 194/73  Pulse 52  Resp 16  Ht 5\' 2"  (1.575 m)  Wt 216 lb (97.977 kg)  BMI 39.51 kg/m2  SpO2 95%   general obese female in no apparent distress alert and oriented x3 Abdomen obese midline incision well-healed no evidence of ventral hernia Inguinal incisions healed nicely with 2+ femoral pulses well-perfused lower extremities with 1+ edema   Today I ordered bilateral ABIs which were 89% on the right 83% on the left with biphasic and triphasic flow.  Assessment:     Doing well post aortobifemoral bypass graft with suprarenal endarterectomy    Plan:     Return to see me on when necessary basis

## 2011-08-22 NOTE — Procedures (Unsigned)
BYPASS GRAFT EVALUATION  INDICATION:  Followup aorta bifemoral bypass graft  HISTORY: Diabetes:  Yes Cardiac:  CABG Hypertension:  Yes Smoking:  Previous Previous Surgery:  Aorta bifemoral bypass graft 05/25/2011 with suprarenal aortic endarterectomy.  SINGLE LEVEL ARTERIAL EXAM                              RIGHT              LEFT Brachial:                    205                206 Anterior tibial:             178                172 Posterior tibial:            183                164 Peroneal: Ankle/brachial index:        0.89               0.83  PREVIOUS ABI:  Date: 04/17/2011  RIGHT:  Equals 0.58  LEFT:  0.24  LOWER EXTREMITY BYPASS GRAFT DUPLEX EXAM:  DUPLEX:  Unable to visualize aortobifemoral graft due to patient body habitus and patient not being n.p.o. There is a velocity noted in the right common femoral artery of 361 cm/sec.  The left distal external iliac artery was visualized with a velocity of 486 cm/sec followed by a velocity of 514 cm/sec noted in the left common femoral artery.  IMPRESSION:  No visualization of the aortobifemoral bypass graft due to reasons as stated above.  Elevated velocities noted in the right and left common femoral arteries with velocities as stated above.  Bilateral ankle brachial indices show improvement in comparison to the last exam.  ___________________________________________ Nelda Severe. Kellie Simmering, M.D.  EM/MEDQ  D:  08/15/2011  T:  08/15/2011  Job:  [id number]

## 2011-08-31 ENCOUNTER — Ambulatory Visit: Payer: Medicare Other | Admitting: Nurse Practitioner

## 2011-09-07 ENCOUNTER — Ambulatory Visit: Payer: Medicare Other | Admitting: Nurse Practitioner

## 2011-09-17 ENCOUNTER — Inpatient Hospital Stay (HOSPITAL_COMMUNITY)
Admission: EM | Admit: 2011-09-17 | Discharge: 2011-09-20 | DRG: 392 | Disposition: A | Discharge status: Home / Self Care | Payer: Medicare Other | Attending: Internal Medicine | Admitting: Internal Medicine

## 2011-09-17 ENCOUNTER — Encounter (HOSPITAL_COMMUNITY): Payer: Self-pay | Admitting: *Deleted

## 2011-09-17 ENCOUNTER — Encounter (HOSPITAL_COMMUNITY): Payer: Self-pay

## 2011-09-17 ENCOUNTER — Emergency Department (HOSPITAL_COMMUNITY): Payer: Medicare Other

## 2011-09-17 ENCOUNTER — Emergency Department (INDEPENDENT_AMBULATORY_CARE_PROVIDER_SITE_OTHER)
Admission: EM | Admit: 2011-09-17 | Discharge: 2011-09-17 | Disposition: A | Discharge status: Nursing Home (Includes ICF) | Payer: Medicare Other | Source: Home / Self Care

## 2011-09-17 DIAGNOSIS — N189 Chronic kidney disease, unspecified: Secondary | ICD-10-CM | POA: Diagnosis present

## 2011-09-17 DIAGNOSIS — I10 Essential (primary) hypertension: Secondary | ICD-10-CM | POA: Diagnosis present

## 2011-09-17 DIAGNOSIS — IMO0001 Reserved for inherently not codable concepts without codable children: Secondary | ICD-10-CM | POA: Diagnosis present

## 2011-09-17 DIAGNOSIS — F411 Generalized anxiety disorder: Secondary | ICD-10-CM | POA: Diagnosis present

## 2011-09-17 DIAGNOSIS — E669 Obesity, unspecified: Secondary | ICD-10-CM | POA: Diagnosis present

## 2011-09-17 DIAGNOSIS — I2510 Atherosclerotic heart disease of native coronary artery without angina pectoris: Secondary | ICD-10-CM | POA: Diagnosis present

## 2011-09-17 DIAGNOSIS — E119 Type 2 diabetes mellitus without complications: Secondary | ICD-10-CM | POA: Diagnosis present

## 2011-09-17 DIAGNOSIS — Z6832 Body mass index (BMI) 32.0-32.9, adult: Secondary | ICD-10-CM

## 2011-09-17 DIAGNOSIS — I70219 Atherosclerosis of native arteries of extremities with intermittent claudication, unspecified extremity: Secondary | ICD-10-CM | POA: Diagnosis present

## 2011-09-17 DIAGNOSIS — Z951 Presence of aortocoronary bypass graft: Secondary | ICD-10-CM

## 2011-09-17 DIAGNOSIS — Z87891 Personal history of nicotine dependence: Secondary | ICD-10-CM

## 2011-09-17 DIAGNOSIS — Z7982 Long term (current) use of aspirin: Secondary | ICD-10-CM

## 2011-09-17 DIAGNOSIS — Z79899 Other long term (current) drug therapy: Secondary | ICD-10-CM

## 2011-09-17 DIAGNOSIS — N179 Acute kidney failure, unspecified: Secondary | ICD-10-CM | POA: Diagnosis present

## 2011-09-17 DIAGNOSIS — R197 Diarrhea, unspecified: Secondary | ICD-10-CM | POA: Diagnosis present

## 2011-09-17 DIAGNOSIS — N184 Chronic kidney disease, stage 4 (severe): Secondary | ICD-10-CM | POA: Diagnosis present

## 2011-09-17 DIAGNOSIS — A088 Other specified intestinal infections: Principal | ICD-10-CM | POA: Diagnosis present

## 2011-09-17 DIAGNOSIS — Z8249 Family history of ischemic heart disease and other diseases of the circulatory system: Secondary | ICD-10-CM

## 2011-09-17 DIAGNOSIS — Z794 Long term (current) use of insulin: Secondary | ICD-10-CM

## 2011-09-17 DIAGNOSIS — J441 Chronic obstructive pulmonary disease with (acute) exacerbation: Secondary | ICD-10-CM

## 2011-09-17 DIAGNOSIS — E860 Dehydration: Secondary | ICD-10-CM

## 2011-09-17 DIAGNOSIS — Z882 Allergy status to sulfonamides status: Secondary | ICD-10-CM

## 2011-09-17 DIAGNOSIS — I129 Hypertensive chronic kidney disease with stage 1 through stage 4 chronic kidney disease, or unspecified chronic kidney disease: Secondary | ICD-10-CM | POA: Diagnosis present

## 2011-09-17 LAB — POCT I-STAT 3, VENOUS BLOOD GAS (G3P V)
Acid-base deficit: 2 mmol/L (ref 0.0–2.0)
Bicarbonate: 24.4 mEq/L — ABNORMAL HIGH (ref 20.0–24.0)
pCO2, Ven: 44.7 mmHg — ABNORMAL LOW (ref 45.0–50.0)
pO2, Ven: 22 mmHg — CL (ref 30.0–45.0)

## 2011-09-17 LAB — URINALYSIS, ROUTINE W REFLEX MICROSCOPIC
Nitrite: NEGATIVE
Specific Gravity, Urine: 1.021 (ref 1.005–1.030)
Urobilinogen, UA: 0.2 mg/dL (ref 0.0–1.0)

## 2011-09-17 LAB — COMPREHENSIVE METABOLIC PANEL
BUN: 75 mg/dL — ABNORMAL HIGH (ref 6–23)
CO2: 23 mEq/L (ref 19–32)
Calcium: 9.8 mg/dL (ref 8.4–10.5)
Chloride: 91 mEq/L — ABNORMAL LOW (ref 96–112)
Creatinine, Ser: 2.27 mg/dL — ABNORMAL HIGH (ref 0.50–1.10)
GFR calc non Af Amer: 21 mL/min — ABNORMAL LOW (ref >90)
Total Bilirubin: 0.5 mg/dL (ref 0.3–1.2)

## 2011-09-17 LAB — CBC
MCH: 29.9 pg (ref 26.0–34.0)
RBC: 4.32 MIL/uL (ref 3.87–5.11)
WBC: 6.8 10*3/uL (ref 4.0–10.5)

## 2011-09-17 LAB — DIFFERENTIAL
Basophils Relative: 0 % (ref 0–1)
Eosinophils Relative: 0 % (ref 0–5)
Monocytes Absolute: 0.4 10*3/uL (ref 0.1–1.0)
Monocytes Relative: 5 % (ref 3–12)
Neutro Abs: 5.5 10*3/uL (ref 1.7–7.7)

## 2011-09-17 LAB — GLUCOSE, CAPILLARY: Glucose-Capillary: >600 mg/dL (ref 70–99)

## 2011-09-17 LAB — URINE MICROSCOPIC-ADD ON

## 2011-09-17 MED ORDER — SODIUM CHLORIDE 0.9 % IV SOLN
INTRAVENOUS | Status: DC
  Administered 2011-09-18: 8.3 [IU]/h via INTRAVENOUS
  Filled 2011-09-17: qty 1

## 2011-09-17 MED ORDER — INSULIN REGULAR BOLUS VIA INFUSION
0.0000 [IU] | Freq: Three times a day (TID) | INTRAVENOUS | Status: DC
  Administered 2011-09-18: 4.4 [IU] via INTRAVENOUS
  Administered 2011-09-18: 2.4 [IU] via INTRAVENOUS
  Filled 2011-09-17: qty 10

## 2011-09-17 MED ORDER — DEXTROSE-NACL 5-0.45 % IV SOLN
INTRAVENOUS | Status: DC
  Administered 2011-09-18: 12:00:00 via INTRAVENOUS

## 2011-09-17 MED ORDER — DEXTROSE 50 % IV SOLN: 25.0000 mL | INTRAVENOUS | Status: DC | PRN

## 2011-09-17 MED ORDER — SODIUM CHLORIDE 0.9 % IV SOLN
INTRAVENOUS | Status: DC
  Administered 2011-09-18: via INTRAVENOUS

## 2011-09-17 NOTE — ED Notes (Signed)
Pt unable to void  Still having diarrhea

## 2011-09-17 NOTE — ED Notes (Signed)
MD at bedside. Dr. Lockwood at bedside.  

## 2011-09-17 NOTE — ED Notes (Signed)
C/o vomiting thick mucous, diarrhea and decreased appetite for 2 weeks.  States per her PCP she has been taking imodium, prilosec and and promethiazine with some improvement.  States Dr Karlton Lemon told her today to come to hospital for x ray and IV fluids.  Denies fever.  Reports she has been eating soup and drinking ensure.

## 2011-09-17 NOTE — ED Provider Notes (Signed)
History     CSN: [id number]  Arrival date & time 09/17/11  2115   First MD Initiated Contact with Patient 09/17/11 2259      Chief Complaint  Patient presents with  . Abdominal Pain    HPI The patient presents with 2 weeks of generalized discomfort, abdominal pain, anorexia, vomiting.  She is an insulin-dependent diabetic.  She notes that she stopped using all her medication as directed in the past few days.  She has also not been checking her blood glucose regularly.  Symptoms began gradually.  Since onset they have become incapacitating.  The patient denies any confusion, disorientation.  She also denies any chest pain or dyspnea.  She denies any dysuria, hematuria.  She does endorse mild cough.  She also complains of intermittent diarrhea, intermittent emesis.  No relief of her diarrhea with Imodium.  She spoke with her physician today and was referred to the emergency department for evaluation.  She mistakenly presents to urgent care first.  She was discharged from that facility and sent directly to our facility. Past Medical History  Diagnosis Date  . Claudication   . Diabetes mellitus   . Hypertension   . Hyperlipidemia   . DJD (degenerative joint disease)   . PAD (peripheral artery disease)   . Coronary artery disease   . Leg pain   . Carotid artery occlusion   . Obesity   . DDD (degenerative disc disease)   . DJD (degenerative joint disease)   . Shortness of breath     exertion  . Anxiety   . Diabetic coma   . Edema     Past Surgical History  Procedure Date  . Cardiac catheterization 12/01/2009    EF 65%  . Removal of fibrous cyst from right breast 10+ YEARS  . Retinal detachment surgery 10+ YEARS    LEFT EYE  . Coronary artery bypass graft 12/08/2009    LIMA GRAFT TO THE DISTAL LAD AND SAPHENOUS VEIN GRAFT TO THE OBTUSE MARGINAL VESSEL  . Transthoracic echocardiogram 12/01/2009    EF 60-65%  . Cardiovascular stress test 11/28/2009    EF 75%  . Aorta - bilateral  femoral artery bypass graft 05/25/2011    Procedure: AORTA BIFEMORAL BYPASS GRAFT;  Surgeon: Mal Misty, MD;  Location: [place];  Service: Vascular;  Laterality: N/A;  . Pr vein bypass graft,aorto-fem-pop 05/25/11    Family History  Problem Relation Age of Onset  . Hypertension Mother   . Coronary artery disease Father   . Hypertension Sister   . Cirrhosis Brother   . Kidney disease Daughter     History  Substance Use Topics  . Smoking status: Former Smoker -- 20 years    Types: Cigarettes    Quit date: 07/10/1991  . Smokeless tobacco: Never Used  . Alcohol Use: No    OB History    Grav Para Term Preterm Abortions TAB SAB Ect Mult Living                  Review of Systems  Constitutional:       HPI  HENT:       HPI otherwise negative  Eyes: Negative.   Respiratory:       HPI, otherwise negative  Cardiovascular:       HPI, otherwise nmegative  Gastrointestinal: Positive for nausea, vomiting and diarrhea. Negative for abdominal pain, constipation, blood in stool and abdominal distention.  Genitourinary:       HPI, otherwise  negative  Musculoskeletal:       HPI, otherwise negative  Skin: Negative.   Neurological: Negative for dizziness, syncope, weakness, light-headedness and headaches.    Allergies  Iohexol and Sulfa antibiotics  Home Medications   Current Outpatient Rx  Name Route Sig Dispense Refill  . ACETAMINOPHEN 325 MG PO TABS Oral Take 325-650 mg by mouth every 6 (six) hours as needed. For pain    . ASPIRIN 325 MG PO TABS Oral Take 325 mg by mouth daily.      . ATORVASTATIN CALCIUM 20 MG PO TABS Oral Take 20 mg by mouth daily.      Marland Kitchen CALCIUM + D PO Oral Take by mouth daily.      Marland Kitchen CARVEDILOL 25 MG PO TABS Oral Take 1 tablet (25 mg total) by mouth 2 (two) times daily. 60 tablet 11  . COENZYME Q10 200 MG PO CAPS Oral Take 200 mg by mouth daily.      Marland Kitchen DOCUSATE SODIUM 100 MG PO CAPS Oral Take 100 mg by mouth daily.      Marland Kitchen FERROUS SULFATE 325 (65 FE)  MG PO TABS Oral Take 1 tablet (325 mg total) by mouth 2 (two) times daily with a meal. 30 tablet 0  . OMEGA-3 FATTY ACIDS 1000 MG PO CAPS Oral Take 1 g by mouth 2 (two) times daily.      . FUROSEMIDE 20 MG PO TABS Oral Take 3 tablets (60 mg total) by mouth 2 (two) times daily. 1 tablet 0  . HYDRALAZINE HCL 50 MG PO TABS Oral Take 50 mg by mouth 3 (three) times daily.    . INSULIN ASPART 100 UNIT/ML [place] SOLN Subcutaneous Inject 0-9 Units into the skin 3 (three) times daily with meals. CBG 70 - 120: 0 units CBG 121 - 150: 1 unit CBG 151 - 200: 2 units CBG 201 - 250: 3 units CBG 251 - 300: 5 units CBG 301 - 350: 7 units and call MD  1 vial   . INSULIN ASPART 100 UNIT/ML [place] SOLN Subcutaneous Inject 0-5 Units into the skin at bedtime. CBG 70 - 120: 0 units CBG 121 - 150: 0 units CBG 151 - 200: 0 units CBG 201 - 250: 2 units CBG 251 - 300: 3 units CBG 301 - 350: 4 units and call MD  1 vial   . INSULIN GLARGINE 100 UNIT/ML [place] SOLN Subcutaneous Inject 6 Units into the skin 2 (two) times daily. 17 units in the pm    . MAGNESIUM ASPARTATE HCL 615 MG PO TBEC Oral Take 615 mg by mouth 2 (two) times daily.    Marland Kitchen ONE-DAILY MULTI VITAMINS PO TABS Oral Take 1 tablet by mouth daily.      . OXYCODONE-ACETAMINOPHEN 5-325 MG PO TABS Oral Take 1 tablet by mouth every 4 (four) hours as needed.      Marland Kitchen POLYETHYLENE GLYCOL 3350 PO PACK Oral Take 17 g by mouth daily as needed. For constipation    . POTASSIUM CHLORIDE CRYS ER 20 MEQ PO TBCR Oral Take 20 mEq by mouth daily.    . ACETAMINOPHEN 325 MG PO TABS Oral Take 325-650 mg by mouth every 4 (four) hours as needed. For pain/fever    . DOCUSATE SODIUM 50 MG/5ML PO LIQD Oral Take 10 mLs (100 mg total) by mouth daily. 100 mL 0  . OXYCODONE-ACETAMINOPHEN 5-325 MG PO TABS Oral Take 1-2 tablets by mouth every 4 (four) hours as needed. For pain 15 tablet 0  .  POLYETHYLENE GLYCOL 3350 PO PACK Oral Take 17 g by mouth daily as needed. 14 each 0  . POLYETHYLENE GLYCOL 3350 PO  PACK Oral Take 17 g by mouth daily as needed (for constipation). 14 each 0    BP 154/45  Pulse 56  Temp(Src) 97.5 F (36.4 C) (Oral)  Resp 16  SpO2 98%  Physical Exam  Nursing note and vitals reviewed. Constitutional: She is oriented to person, place, and time. She appears well-developed and well-nourished. No distress.  HENT:  Head: Normocephalic and atraumatic.  Eyes: Conjunctivae and EOM are normal.  Cardiovascular: Normal rate and regular rhythm.   Pulmonary/Chest: Effort normal and breath sounds normal. No stridor. No respiratory distress.  Abdominal: She exhibits no distension.  Musculoskeletal: She exhibits no edema.  Neurological: She is alert and oriented to person, place, and time. No cranial nerve deficit.  Skin: Skin is warm and dry.  Psychiatric: She has a normal mood and affect.    ED Course  Procedures (including critical care time)  Labs Reviewed  DIFFERENTIAL - Abnormal; Notable for the following:    Neutrophils Relative 80 (*)    All other components within normal limits  COMPREHENSIVE METABOLIC PANEL - Abnormal; Notable for the following:    Sodium 131 (*)    Chloride 91 (*)    Glucose, Bld 888 (*)    BUN 75 (*)    Creatinine, Ser 2.27 (*)    Albumin 3.4 (*)    GFR calc non Af Amer 21 (*)    GFR calc Af Amer 25 (*)    All other components within normal limits  CBC  URINALYSIS, ROUTINE W REFLEX MICROSCOPIC   No results found.   No diagnosis found.  Cardiac monitor 50 sinus bradycardia, abnormal Pulse oximetry 99% room air normal Chest x-ray reviewed by me    MDM  This 66 year old female with insulin-dependent diabetes now presents with 2 weeks of fatigue, abdominal pain, nausea, vomiting.  On exam the patient is in no distress, with reassuring vital signs.  The patient's labs are notable for a blood glucose of greater than 800, the absence of ketonuria, and new renal dysfunction.  The patient's presentation is most consistent with nonketotic  hyperglycemic hyperosmolar state.  The patient was started on the glucose stabilizer protocol and admitted for further evaluation and management.  Carmin Muskrat, MD 09/18/11 [phone]

## 2011-09-17 NOTE — ED Provider Notes (Signed)
Norma [name] is a 66 y.o. female who presents to Urgent Care today for 2 week history of nausea, vomiting, diarrhea. She presented to her primary care physician to was concern for dehydration and told her to come to the hospital. However she came to urgent care by mistake. She has indeed had the above symptoms. She also has had a 50 pound weight loss since January which was unintentional. She is complaining of dysfunction of her roughly the past 2 weeks. Evidently primary care physician at set of lab work drawn which did indeed show dehydration. Patient has had persistent diarrhea despite Imodium use and has recently been discharged from long-term care facility. No fevers or chills. No abdominal pain currently.  PMH reviewed.  ROS as above otherwise neg Medications reviewed. No current facility-administered medications for this encounter.   Current Outpatient Prescriptions  Medication Sig Dispense Refill  . aspirin 325 MG tablet Take 325 mg by mouth daily.        Marland Kitchen atorvastatin (LIPITOR) 20 MG tablet Take 20 mg by mouth daily.        . Calcium Carbonate-Vitamin D (CALCIUM + D PO) Take by mouth daily.        . carvedilol (COREG) 25 MG tablet Take 1 tablet (25 mg total) by mouth 2 (two) times daily.  60 tablet  11  . Coenzyme Q10 200 MG capsule Take 200 mg by mouth daily.        . hydrALAZINE (APRESOLINE) 50 MG tablet Take 50 mg by mouth 3 (three) times daily.      . insulin aspart (NOVOLOG) 100 UNIT/ML injection Inject 0-5 Units into the skin at bedtime. CBG 70 - 120: 0 units CBG 121 - 150: 0 units CBG 151 - 200: 0 units CBG 201 - 250: 2 units CBG 251 - 300: 3 units CBG 301 - 350: 4 units and call MD   1 vial    . magnesium aspartate (MAGINEX) 615 MG tablet Take 615 mg by mouth 2 (two) times daily.      Marland Kitchen acetaminophen (TYLENOL) 325 MG tablet Take 1-2 tablets (325-650 mg total) by mouth every 4 (four) hours as needed (or temp >/= 101 F).  30 tablet  0  . acetaminophen (TYLENOL) 325 MG tablet  Take 650 mg by mouth every 6 (six) hours as needed.      . docusate (COLACE) 50 MG/5ML liquid Take 10 mLs (100 mg total) by mouth daily.  100 mL  0  . docusate sodium (COLACE) 100 MG capsule Take 100 mg by mouth daily.        . ferrous sulfate 325 (65 FE) MG tablet Take 1 tablet (325 mg total) by mouth 2 (two) times daily with a meal.  30 tablet  0  . fish oil-omega-3 fatty acids 1000 MG capsule Take 1 g by mouth 2 (two) times daily.        . furosemide (LASIX) 20 MG tablet Take 3 tablets (60 mg total) by mouth 2 (two) times daily.  1 tablet  0  . insulin aspart (NOVOLOG) 100 UNIT/ML injection Inject 0-9 Units into the skin 3 (three) times daily with meals. CBG 70 - 120: 0 units CBG 121 - 150: 1 unit CBG 151 - 200: 2 units CBG 201 - 250: 3 units CBG 251 - 300: 5 units CBG 301 - 350: 7 units and call MD   1 vial    . insulin glargine (LANTUS) 100 UNIT/ML injection Inject 6 Units  into the skin 2 (two) times daily.  10 mL    . Multiple Vitamin (MULTIVITAMIN) tablet Take 1 tablet by mouth daily.        Marland Kitchen oxyCODONE-acetaminophen (PERCOCET) 5-325 MG per tablet Take 1-2 tablets by mouth every 4 (four) hours as needed. For pain  15 tablet  0  . oxyCODONE-acetaminophen (PERCOCET) 5-325 MG per tablet Take 1 tablet by mouth every 4 (four) hours as needed.        . polyethylene glycol (MIRALAX / GLYCOLAX) packet Take 17 g by mouth daily as needed.  14 each  0  . polyethylene glycol (MIRALAX / GLYCOLAX) packet Take 17 g by mouth daily as needed (for constipation).  14 each  0  . polyethylene glycol (MIRALAX / GLYCOLAX) packet Take 17 g by mouth as needed.        . potassium chloride SA (K-DUR,KLOR-CON) 20 MEQ tablet Take 20 mEq by mouth daily.      Marland Kitchen DISCONTD: carvedilol (COREG) 6.25 MG tablet Take 3 tablets (18.75 mg total) by mouth 2 (two) times daily with a meal.  30 tablet  0    Exam:  BP 120/49  Pulse 50  Temp(Src) 98.6 F (37 C) (Oral)  Resp 16  SpO2 99% Gen: No apparent distress, sitting in  wheelchair. Does appear like she feels ill but is nontoxic. HEENT: EOMI,  dry mucous membranes Lungs: CTABL Nl WOB Heart: RRR no MRG Abd: NABS, obese. Soft and nondistended. Minimal diffuse tenderness throughout. Exts: +1 edema bilateral lower extremities.  Assessment and Plan:  #1. Dehydration: Patient does show evidence of dehydration my exam today. Plan is to send her to the emergency department for further workup as well as IV hydration. Do not believe oral rehydration will work as she has not been able to tolerate by mouth fluids. Concern is obviously for C. difficile in patient with long-term diarrhea after being discharged from long-term care facility. I agree with her primary care physician that she needs further workup in the hospital. Marya Amsler transferring her now.    Alveda Reasons, MD 09/17/11 2041

## 2011-09-17 NOTE — ED Notes (Signed)
The pt  Has had abd pain with nausea and vomiting for 9 days

## 2011-09-18 ENCOUNTER — Encounter (HOSPITAL_COMMUNITY): Payer: Self-pay | Admitting: Internal Medicine

## 2011-09-18 DIAGNOSIS — R197 Diarrhea, unspecified: Secondary | ICD-10-CM | POA: Diagnosis present

## 2011-09-18 LAB — CBC
HCT: 37.7 % (ref 36.0–46.0)
Hemoglobin: 12.5 g/dL (ref 12.0–15.0)
MCV: 89.5 fL (ref 78.0–100.0)
RBC: 4.21 MIL/uL (ref 3.87–5.11)
WBC: 8.8 10*3/uL (ref 4.0–10.5)

## 2011-09-18 LAB — POCT I-STAT, CHEM 8
BUN: 76 mg/dL — ABNORMAL HIGH (ref 6–23)
Creatinine, Ser: 2.3 mg/dL — ABNORMAL HIGH (ref 0.50–1.10)
Glucose, Bld: >700 mg/dL (ref 70–99)
Hemoglobin: 13.3 g/dL (ref 12.0–15.0)
Potassium: 4.5 mEq/L (ref 3.5–5.1)

## 2011-09-18 LAB — GLUCOSE, CAPILLARY
Glucose-Capillary: 443 mg/dL — ABNORMAL HIGH (ref 70–99)
Glucose-Capillary: 238 mg/dL — ABNORMAL HIGH (ref 70–99)
Glucose-Capillary: 205 mg/dL — ABNORMAL HIGH (ref 70–99)
Glucose-Capillary: 314 mg/dL — ABNORMAL HIGH (ref 70–99)

## 2011-09-18 LAB — PHOSPHORUS: Phosphorus: 2.8 mg/dL (ref 2.3–4.6)

## 2011-09-18 LAB — COMPREHENSIVE METABOLIC PANEL
Albumin: 3.4 g/dL — ABNORMAL LOW (ref 3.5–5.2)
BUN: 75 mg/dL — ABNORMAL HIGH (ref 6–23)
Calcium: 10.1 mg/dL (ref 8.4–10.5)
Creatinine, Ser: 2.33 mg/dL — ABNORMAL HIGH (ref 0.50–1.10)
GFR calc Af Amer: 24 mL/min — ABNORMAL LOW (ref >90)
Potassium: 3.6 mEq/L (ref 3.5–5.1)
Total Protein: 7.3 g/dL (ref 6.0–8.3)

## 2011-09-18 LAB — MAGNESIUM: Magnesium: 2.2 mg/dL (ref 1.5–2.5)

## 2011-09-18 MED ORDER — ALUM & MAG HYDROXIDE-SIMETH 200-200-20 MG/5ML PO SUSP
30.0000 mL | Freq: Four times a day (QID) | ORAL | Status: DC | PRN
  Filled 2011-09-18: qty 30

## 2011-09-18 MED ORDER — GUAIFENESIN-DM 100-10 MG/5ML PO SYRP
5.0000 mL | ORAL_SOLUTION | ORAL | Status: DC | PRN
  Filled 2011-09-18: qty 5

## 2011-09-18 MED ORDER — SODIUM CHLORIDE 0.9 % IV SOLN
INTRAVENOUS | Status: DC
  Administered 2011-09-18 – 2011-09-19 (×4): via INTRAVENOUS

## 2011-09-18 MED ORDER — INSULIN ASPART 100 UNIT/ML ~~LOC~~ SOLN
0.0000 [IU] | Freq: Three times a day (TID) | SUBCUTANEOUS | Status: DC
  Administered 2011-09-18: 11 [IU] via SUBCUTANEOUS
  Administered 2011-09-19: 5 [IU] via SUBCUTANEOUS
  Administered 2011-09-19: 8 [IU] via SUBCUTANEOUS
  Administered 2011-09-19: 5 [IU] via SUBCUTANEOUS
  Administered 2011-09-20 (×2): 8 [IU] via SUBCUTANEOUS

## 2011-09-18 MED ORDER — SIMVASTATIN 40 MG PO TABS
40.0000 mg | ORAL_TABLET | Freq: Every day | ORAL | Status: DC
  Administered 2011-09-18 – 2011-09-19 (×2): 40 mg via ORAL
  Filled 2011-09-18 (×3): qty 1

## 2011-09-18 MED ORDER — INSULIN GLARGINE 100 UNIT/ML ~~LOC~~ SOLN
5.0000 [IU] | Freq: Once | SUBCUTANEOUS | Status: AC
  Administered 2011-09-18: 5 [IU] via SUBCUTANEOUS

## 2011-09-18 MED ORDER — INSULIN ASPART 100 UNIT/ML ~~LOC~~ SOLN
0.0000 [IU] | Freq: Every day | SUBCUTANEOUS | Status: DC
  Administered 2011-09-18: via SUBCUTANEOUS
  Administered 2011-09-19: 2 [IU] via SUBCUTANEOUS

## 2011-09-18 MED ORDER — ACETAMINOPHEN 325 MG PO TABS: 650.0000 mg | ORAL_TABLET | Freq: Four times a day (QID) | ORAL | Status: DC | PRN

## 2011-09-18 MED ORDER — ALBUTEROL SULFATE (5 MG/ML) 0.5% IN NEBU: 2.5000 mg | INHALATION_SOLUTION | RESPIRATORY_TRACT | Status: DC | PRN

## 2011-09-18 MED ORDER — SODIUM CHLORIDE 0.9 % IV SOLN: INTRAVENOUS | Status: DC

## 2011-09-18 MED ORDER — FERROUS SULFATE 325 (65 FE) MG PO TABS
325.0000 mg | ORAL_TABLET | Freq: Two times a day (BID) | ORAL | Status: DC
  Administered 2011-09-18 – 2011-09-20 (×5): 325 mg via ORAL
  Filled 2011-09-18 (×7): qty 1

## 2011-09-18 MED ORDER — PANTOPRAZOLE SODIUM 40 MG IV SOLR
40.0000 mg | INTRAVENOUS | Status: DC
  Administered 2011-09-18 – 2011-09-19 (×2): 40 mg via INTRAVENOUS
  Filled 2011-09-18 (×4): qty 40

## 2011-09-18 MED ORDER — ACETAMINOPHEN 325 MG PO TABS: 325.0000 mg | ORAL_TABLET | ORAL | Status: DC | PRN

## 2011-09-18 MED ORDER — HYDRALAZINE HCL 50 MG PO TABS
50.0000 mg | ORAL_TABLET | Freq: Three times a day (TID) | ORAL | Status: DC
  Administered 2011-09-18 – 2011-09-20 (×8): 50 mg via ORAL
  Filled 2011-09-18 (×10): qty 1

## 2011-09-18 MED ORDER — ONDANSETRON HCL 4 MG/2ML IJ SOLN: 4.0000 mg | Freq: Four times a day (QID) | INTRAMUSCULAR | Status: DC | PRN

## 2011-09-18 MED ORDER — CARVEDILOL 25 MG PO TABS
25.0000 mg | ORAL_TABLET | Freq: Two times a day (BID) | ORAL | Status: DC
Start: 2011-09-18 — End: 2011-09-20
  Administered 2011-09-18 – 2011-09-20 (×4): 25 mg via ORAL
  Filled 2011-09-18 (×7): qty 1

## 2011-09-18 MED ORDER — ONDANSETRON HCL 4 MG PO TABS: 4.0000 mg | ORAL_TABLET | Freq: Four times a day (QID) | ORAL | Status: DC | PRN

## 2011-09-18 MED ORDER — ENOXAPARIN SODIUM 40 MG/0.4ML ~~LOC~~ SOLN
40.0000 mg | Freq: Every day | SUBCUTANEOUS | Status: DC
  Administered 2011-09-18 – 2011-09-20 (×3): 40 mg via SUBCUTANEOUS
  Filled 2011-09-18 (×3): qty 0.4

## 2011-09-18 MED ORDER — ASPIRIN 325 MG PO TABS
325.0000 mg | ORAL_TABLET | Freq: Every day | ORAL | Status: DC
  Administered 2011-09-18 – 2011-09-20 (×3): 325 mg via ORAL
  Filled 2011-09-18 (×3): qty 1

## 2011-09-18 MED ORDER — HYDROCODONE-ACETAMINOPHEN 5-325 MG PO TABS
1.0000 | ORAL_TABLET | ORAL | Status: DC | PRN
  Administered 2011-09-20: 1 via ORAL
  Filled 2011-09-18: qty 1

## 2011-09-18 MED ORDER — SODIUM CHLORIDE 0.9 % IJ SOLN
3.0000 mL | Freq: Two times a day (BID) | INTRAMUSCULAR | Status: DC
  Administered 2011-09-19 – 2011-09-20 (×2): 3 mL via INTRAVENOUS

## 2011-09-18 MED ORDER — ACETAMINOPHEN 650 MG RE SUPP: 650.0000 mg | Freq: Four times a day (QID) | RECTAL | Status: DC | PRN

## 2011-09-18 NOTE — ED Provider Notes (Signed)
Medical screening examination/treatment/procedure(s) were performed by resident physician or non-physician practitioner and as supervising physician I was immediately available for consultation/collaboration.   Pauline Good MD.    Billy Fischer, MD 09/18/11 [phone]

## 2011-09-18 NOTE — ED Notes (Signed)
CBG CHECKED BY EMT R Particia Strahm-HI

## 2011-09-18 NOTE — H&P (Signed)
PCP:   [name]., MD, MD    Chief Complaint:  MD told her to come in  HPI: Norma [name] is a 66 y.o. female   has a past medical history of Claudication; Diabetes mellitus; Hypertension; Hyperlipidemia; DJD (degenerative joint disease); PAD (peripheral artery disease); Coronary artery disease; Leg pain; Carotid artery occlusion; Obesity; DDD (degenerative disc disease); DJD (degenerative joint disease); Shortness of breath; Anxiety; Diabetic coma; and Edema.   Presented with  1 week of diarrhea a few stools per day but liquid, some nausea and a little vomiting, no chest pain. No blood in stool. No sick contacts.  She have been laying in bed and not checking her blood sugar. Did not noticed increased in urination.  Her PCP Called and she was told to come into ER because she probably is dehydrated in ED was found to have Bg >700 Have had some dry mucous in the back of her throat and not feeling well for few days now.   Review of Systems:    Pertinent positives include:fatigue, abdominal pain, nausea, vomiting, diarrhea,  Constitutional:  No weight loss, night sweats, Fevers, chills,  weight loss  HEENT:  No headaches, Difficulty swallowing,Tooth/dental problems,Sore throat,  No sneezing, itching, ear ache, nasal congestion, post nasal drip,  Cardio-vascular:  No chest pain, Orthopnea, PND, anasarca, dizziness, palpitations.no Bilateral lower extremity swelling  GI:  No heartburn, indigestion,  change in bowel habits, loss of appetite, melena, blood in stool, hematoemesis Resp:  no shortness of breath at rest. No dyspnea on exertion, No excess mucus, no productive cough, No non-productive cough, No coughing up of blood.No change in color of mucus.No wheezing. Skin:  no rash or lesions. No jaundice GU:  no dysuria, change in color of urine, no urgency or frequency. No straining to urinate.  No flank pain.  Musculoskeletal:  No joint pain or no joint swelling. No decreased  range of motion. No back pain.  Psych:  No change in mood or affect. No depression or anxiety. No memory loss.  Neuro: no localizing neurological complaints, no tingling, no weakness, no double vision, no gait abnormality, no slurred speech, no confusion  Otherwise ROS are negative except for above, 10 systems were reviewed  Past Medical History: Past Medical History  Diagnosis Date  . Claudication   . Diabetes mellitus   . Hypertension   . Hyperlipidemia   . DJD (degenerative joint disease)   . PAD (peripheral artery disease)   . Coronary artery disease   . Leg pain   . Carotid artery occlusion   . Obesity   . DDD (degenerative disc disease)   . DJD (degenerative joint disease)   . Shortness of breath     exertion  . Anxiety   . Diabetic coma   . Edema    Past Surgical History  Procedure Date  . Cardiac catheterization 12/01/2009    EF 65%  . Removal of fibrous cyst from right breast 10+ YEARS  . Retinal detachment surgery 10+ YEARS    LEFT EYE  . Coronary artery bypass graft 12/08/2009    LIMA GRAFT TO THE DISTAL LAD AND SAPHENOUS VEIN GRAFT TO THE OBTUSE MARGINAL VESSEL  . Transthoracic echocardiogram 12/01/2009    EF 60-65%  . Cardiovascular stress test 11/28/2009    EF 75%  . Aorta - bilateral femoral artery bypass graft 05/25/2011    Procedure: AORTA BIFEMORAL BYPASS GRAFT;  Surgeon: Mal Misty, MD;  Location: [facility];  Service: Vascular;  Laterality: N/A;  . Pr vein bypass graft,aorto-fem-pop 05/25/11     Medications: Prior to Admission medications   Medication Sig Start Date End Date Taking? Authorizing Provider  acetaminophen (TYLENOL) 325 MG tablet Take 325-650 mg by mouth every 6 (six) hours as needed. For pain   Yes Historical Provider, MD  aspirin 325 MG tablet Take 325 mg by mouth daily.     Yes Historical Provider, MD  atorvastatin (LIPITOR) 20 MG tablet Take 20 mg by mouth daily.     Yes Historical Provider, MD  Calcium Carbonate-Vitamin D (CALCIUM +  D PO) Take by mouth daily.     Yes Historical Provider, MD  carvedilol (COREG) 25 MG tablet Take 1 tablet (25 mg total) by mouth 2 (two) times daily. 07/13/11 07/12/12 Yes Burtis Junes, NP  Coenzyme Q10 200 MG capsule Take 200 mg by mouth daily.     Yes Historical Provider, MD  docusate sodium (COLACE) 100 MG capsule Take 100 mg by mouth daily.     Yes Historical Provider, MD  ferrous sulfate 325 (65 FE) MG tablet Take 1 tablet (325 mg total) by mouth 2 (two) times daily with a meal. 06/08/11 06/07/12 Yes Regina J Roczniak, PA  fish oil-omega-3 fatty acids 1000 MG capsule Take 1 g by mouth 2 (two) times daily.     Yes Historical Provider, MD  furosemide (LASIX) 20 MG tablet Take 3 tablets (60 mg total) by mouth 2 (two) times daily. 06/14/11 06/13/12 Yes Bonnielee Haff, MD  hydrALAZINE (APRESOLINE) 50 MG tablet Take 50 mg by mouth 3 (three) times daily.   Yes Historical Provider, MD  HYDROcodone-acetaminophen (VICODIN) 5-500 MG per tablet Take 1 tablet by mouth every 6 (six) hours as needed.   Yes Historical Provider, MD  insulin aspart (NOVOLOG) 100 UNIT/ML injection Inject 0-9 Units into the skin 3 (three) times daily with meals. CBG 70 - 120: 0 units CBG 121 - 150: 1 unit CBG 151 - 200: 2 units CBG 201 - 250: 3 units CBG 251 - 300: 5 units CBG 301 - 350: 7 units and call MD  06/14/11 06/13/12 Yes Bonnielee Haff, MD  insulin aspart (NOVOLOG) 100 UNIT/ML injection Inject 0-5 Units into the skin at bedtime. CBG 70 - 120: 0 units CBG 121 - 150: 0 units CBG 151 - 200: 0 units CBG 201 - 250: 2 units CBG 251 - 300: 3 units CBG 301 - 350: 4 units and call MD  06/14/11 06/13/12 Yes Bonnielee Haff, MD  insulin glargine (LANTUS) 100 UNIT/ML injection Inject 6 Units into the skin 2 (two) times daily. 17 units in the pm 06/14/11  Yes Bonnielee Haff, MD  magnesium aspartate (MAGINEX) 615 MG tablet Take 615 mg by mouth 2 (two) times daily.   Yes Historical Provider, MD  Multiple Vitamin (MULTIVITAMIN) tablet  Take 1 tablet by mouth daily.     Yes Historical Provider, MD  polyethylene glycol (MIRALAX / GLYCOLAX) packet Take 17 g by mouth daily as needed. For constipation   Yes Historical Provider, MD  potassium chloride SA (K-DUR,KLOR-CON) 20 MEQ tablet Take 20 mEq by mouth daily.   Yes Historical Provider, MD  acetaminophen (TYLENOL) 325 MG tablet Take 325-650 mg by mouth every 4 (four) hours as needed. For pain/fever 06/08/11 06/18/11  Nancy Nordmann Roczniak, PA  docusate (COLACE) 50 MG/5ML liquid Take 10 mLs (100 mg total) by mouth daily. 06/08/11 06/18/11  Richrd Prime, PA  oxyCODONE-acetaminophen (PERCOCET) 5-325 MG per tablet Take 1-2  tablets by mouth every 4 (four) hours as needed. For pain 06/14/11 06/24/11  Bonnielee Haff, MD    Allergies:   Allergies  Allergen Reactions  . Iohexol      Code: RASH, Desc: pt called 1 day post scanning stating that skin was red and "itching all over" some what better but still had symptom.. instructed pt to take benadryl to relieve symptoms,per dr Alvester Chou.if any problems call back/mms, Onset Date: [id number]   . Sulfa Antibiotics     Social History:  Ambulatory independently Lives at home with daughter   reports that she quit smoking about 20 years ago. Her smoking use included Cigarettes. She quit after 20 years of use. She has never used smokeless tobacco. She reports that she does not drink alcohol or use illicit drugs.   Family History: family history includes Cirrhosis in her brother; Coronary artery disease in her father; Hypertension in her mother and sister; and Kidney disease in her daughter.    Physical Exam: Patient Vitals for the past 24 hrs:  BP Temp Temp src Pulse Resp SpO2  09/18/11 0100 145/42 mmHg - - 59  - 95 %  09/18/11 0045 139/42 mmHg - - 57  - 91 %  09/18/11 0030 137/43 mmHg - - 57  - 92 %  09/18/11 0015 149/43 mmHg - - 56  - 94 %  09/17/11 2330 164/86 mmHg - - 55  12  96 %  09/17/11 2320 154/45 mmHg - - 56  16  98 %  09/17/11  2122 130/34 mmHg 97.5 F (36.4 C) Oral 52  20  93 %    1. General:  in No Acute distress 2. Psychological: Alert and Oriented 3. Head/ENT:    Dry Mucous Membranes                          Head Non traumatic, neck supple                          Normal  Dentition 4. SKIN: decreased Skin turgor,  Skin clean Dry and intact no rash 5. Heart: Regular rate and rhythm no Murmur, Rub or gallop 6. Lungs: Clear to auscultation bilaterally, no wheezes or crackles   7. Abdomen: Soft, non-tender, slightly distended 8. Lower extremities: no clubbing, cyanosis, or edema 9. Neurologically Grossly intact, moving all 4 extremities equally 10. MSK: Normal range of motion  body mass index is unknown because there is no height or weight on file.   Labs on Admission:   [facility] 09/18/11 0123 09/17/11 2148  NA 136 131*  K 4.5 4.8  CL 104 91*  CO2 -- 23  GLUCOSE >700* 888*  BUN 76* 75*  CREATININE 2.30* 2.27*  CALCIUM -- 9.8  MG -- --  PHOS -- --    Basename 09/17/11 2148  AST 24  ALT 24  ALKPHOS 85  BILITOT 0.5  PROT 7.4  ALBUMIN 3.4*   No results found for this basename: LIPASE:2,AMYLASE:2 in the last 72 hours  Basename 09/18/11 0123 09/17/11 2148  WBC -- 6.8  NEUTROABS -- 5.5  HGB 13.3 12.9  HCT 39.0 40.4  MCV -- 93.5  PLT -- 191   No results found for this basename: CKTOTAL:3,CKMB:3,CKMBINDEX:3,TROPONINI:3 in the last 72 hours No results found for this basename: TSH,T4TOTAL,FREET3,T3FREE,THYROIDAB in the last 72 hours No results found for this basename: VITAMINB12:2,FOLATE:2,FERRITIN:2,TIBC:2,IRON:2,RETICCTPCT:2 in the last 72 hours Lab Results  Component  Value Date   HGBA1C 6.1* 06/13/2011    The CrCl is unknown because both a height and weight (above a minimum accepted value) are required for this calculation. ABG    Component Value Date/Time   PHART 7.364 05/28/2011 0405   HCO3 24.4* 09/17/2011 2349   TCO2 23 09/18/2011 0123   ACIDBASEDEF 2.0 09/17/2011 2349   O2SAT  35.0 09/17/2011 2349     No results found for this basename: DDIMER     Other results:   UA no infection   Cultures:    Component Value Date/Time   SDES BLOOD LEFT ARM 03/19/2007 1545   SPECREQUEST BOTTLES DRAWN AEROBIC AND ANAEROBIC 5CC 03/19/2007 1545   CULT NO GROWTH 5 DAYS 03/19/2007 1545   REPTSTATUS 03/25/2007 FINAL 03/19/2007 1545       Radiological Exams on Admission: Dg Chest 2 View  09/18/2011  *RADIOLOGY REPORT*  Clinical Data: Short of breath  CHEST - 2 VIEW  Comparison: 05/29/2011  Findings: Right neck introducer sheath has been removed.  Heart is mildly enlarged.  Lungs are clear.  No pneumothorax or pleural effusion.  IMPRESSION: Cardiomegaly without edema.  Original Report Authenticated By: Jamas Lav, M.D.    Assessment/Plan  66 yo w Hx of DM here with diarrhea and dehydration and hyperglycemia  Present on Admission:  .Acute on chronic kidney injury - - likely secondary to dehydration,  if not improved with IVF would obtain renal US and consider renal consult.   .Coronary artery disease - currently stable no chest pain, continue home medications .Diabetes mellitus type 2, insulin dependent - uncontrolled on insulin gtt Diarrhea - will send stool cultures, no recent antibiotics but will get c. dif Hyperglycemia - glucose stabalizer .Hypertension - coninue home medications  Prophylaxis:  Lovenox, Protonix  CODE STATUS: FULL CODE   Lexianna Weinrich 09/18/2011, 1:42 AM

## 2011-09-18 NOTE — Progress Notes (Signed)
Utilization Review Completed.Donne Anon T3/06/2012

## 2011-09-18 NOTE — Progress Notes (Addendum)
Patient "admitted with 1 week of diarrhea a few stools per day but liquid, some nausea and a little vomiting, no chest pain. No blood in stool. No sick contacts.  She has been laying in bed and not checking her blood sugar. Did not notice increase in urination. Her PCP Called and she was told to come into ER because she probably is dehydrated in ED was found to have Bg >700".  Called [facility] 09/18/11 to verify home insulin regimen.  Per pharmacist at [facility], patient's insulin regimen is as follows:  Novolog 4 units tid with meals (Novolog last filled 07/24/11) Lantus 36 units bid (Lantus last filled 03/20/11)  Patient currently on IV insulin drip per GlucoStabilizer.  CBGs trending downwards.  Called RN on 6700 at 11:53am to remind her to switch IVF to D5 1/2 NS at 100/hour per GlucoStabilizer orders since CBG has been below 250 mg/dl.  When patient ready to transition off IV insulin drip, please make sure patient gets a portion of her home Lantus dose before insulin drip d/c'd.  Please also start Novolog Moderate SSI immediately upon d/c of insulin drip.  Will follow.  Wyn Quaker RN, MSN, CDE Diabetes Coordinator Inpatient Diabetes Program [phone]

## 2011-09-18 NOTE — Progress Notes (Signed)
Patient ID: Norma [name], female   DOB: 08-Aug-1945, 66 y.o.   MRN: [id number]  Assessment/Plan:  66 y.o. female with  past medical history of Claudication; Diabetes mellitus; Hypertension; Hyperlipidemia; DJD (degenerative joint disease); PAD (peripheral artery disease); Coronary artery disease; Leg pain; Carotid artery occlusion; Obesity admitted for 1 week of diarrhea and some nausea and vomiting.   Active Problems:   *Diarrhea - awaiting stool studies - rule out C. Diff although I think this is less likely as patient's diarrhea has resolved; also, she has not taken any antibiotic recently and no sick contacts at home - nausea and vomiting resolved but patient complains of some dysphagia - SLP evaluation to follow  Acute on chronic kidney disease, stage 4 - continue to monitor while in hospital - appears to be stable   Hypertension - BP at goal - continue hydralazine and coreg   Diabetes mellitus type 2, insulin dependent - sliding scale insulin - continue CBG monitoring   EDUCATION - test results and diagnostic studies were discussed with patient and pt's family who was present at the bedside - patient and family have verbalized the understanding - questions were answered at the bedside and contact information was provided for additional questions or concerns - PT evaluation - HHPT ? SNF  Subjective: No events overnight. Patient denies chest pain, shortness of breath, abdominal pain. Reports diarrhea has resolved as well as no longer having nausea or vomiting.  Objective:  Vital signs in last 24 hours:  Filed Vitals:   09/18/11 0853 09/18/11 0945 09/18/11 1259 09/18/11 1657  BP: 145/87 116/49 110/40 115/34  Pulse: 80 55 46 53  Temp: 98.4 F (36.9 C) 98 F (36.7 C) 98.4 F (36.9 C) 97.7 F (36.5 C)  TempSrc: Oral Oral Oral Oral  Resp: 19 17 17 18   Weight:      SpO2: 93% 95% 98% 97%    Intake/Output from previous day:   Intake/Output Summary (Last 24 hours)  at 09/18/11 1829 Last data filed at 09/18/11 1500  Gross per 24 hour  Intake    780 ml  Output      2 ml  Net    778 ml    Physical Exam: General: Alert, awake, oriented x3, in no acute distress. HEENT: No bruits, no goiter. Moist mucous membranes, no scleral icterus, no conjunctival pallor. Heart: Regular rate and rhythm, S1/S2 +, no murmurs, rubs, gallops. Lungs: Clear to auscultation bilaterally. No wheezing, no rhonchi, no rales.  Abdomen: Soft, nontender, nondistended, positive bowel sounds. Extremities: No clubbing or cyanosis, no pitting edema,  positive pedal pulses. Neuro: Grossly nonfocal.  Lab Results:  Basic Metabolic Panel:    Component Value Date/Time   NA 142 09/18/2011 0555   K 3.6 09/18/2011 0555   CL 102 09/18/2011 0555   CO2 23 09/18/2011 0555   BUN 75* 09/18/2011 0555   CREATININE 2.33* 09/18/2011 0555   GLUCOSE 280* 09/18/2011 0555   CALCIUM 10.1 09/18/2011 0555   CBC:    Component Value Date/Time   WBC 8.8 09/18/2011 0555   HGB 12.5 09/18/2011 0555   HCT 37.7 09/18/2011 0555   PLT 215 09/18/2011 0555   MCV 89.5 09/18/2011 0555   NEUTROABS 5.5 09/17/2011 2148   LYMPHSABS 1.0 09/17/2011 2148   MONOABS 0.4 09/17/2011 2148   EOSABS 0.0 09/17/2011 2148   BASOSABS 0.0 09/17/2011 2148      Lab 09/18/11 0555 09/18/11 0123 09/17/11 2148  WBC 8.8 -- 6.8  HGB 12.5 13.3  12.9  HCT 37.7 39.0 40.4  PLT 215 -- 191  MCV 89.5 -- 93.5  MCH 29.7 -- 29.9  MCHC 33.2 -- 31.9  RDW 14.1 -- 14.2  LYMPHSABS -- -- 1.0  MONOABS -- -- 0.4  EOSABS -- -- 0.0  BASOSABS -- -- 0.0  BANDABS -- -- --    Lab 09-20-11 0555 Sep 20, 2011 0123 09/17/11 2148  NA 142 136 131*  K 3.6 4.5 4.8  CL 102 104 91*  CO2 23 -- 23  GLUCOSE 280* >700* 888*  BUN 75* 76* 75*  CREATININE 2.33* 2.30* 2.27*  CALCIUM 10.1 -- 9.8  MG 2.2 -- --    Studies/Results: Dg Chest 2 View 2011/09/20 IMPRESSION: Cardiomegaly without edema.     Medications: Scheduled Meds:   . aspirin  325 mg Oral Daily  .  carvedilol  25 mg Oral BID WC  . enoxaparin  40 mg Subcutaneous Daily  . ferrous sulfate  325 mg Oral BID WC  . hydrALAZINE  50 mg Oral Q8H  . insulin aspart  0-15 Units Subcutaneous TID WC  . insulin aspart  0-5 Units Subcutaneous QHS  . insulin glargine  5 Units Subcutaneous Once  . pantoprazole (PROTONIX) IV  40 mg Intravenous Q24H  . simvastatin  40 mg Oral q1800   Continuous Infusions:   . sodium chloride 100 mL/hr at 09-20-2011 0846  . dextrose 5 % and 0.45% NaCl 100 mL/hr at 2011/09/20 1204  . DISCONTD: sodium chloride 125 mL/hr at 2011-09-20 0007  . DISCONTD: insulin (NOVOLIN-R) infusion 5.4 Units/hr (September 20, 2011 0206)  . DISCONTD: insulin (NOVOLIN-R) infusion     PRN Meds:.acetaminophen, acetaminophen, albuterol, alum & mag hydroxide-simeth, dextrose, guaiFENesin-dextromethorphan, HYDROcodone-acetaminophen, ondansetron (ZOFRAN) IV, ondansetron, DISCONTD: acetaminophen   LOS: 1 day   Norma [name] Sep 20, 2011, 6:29 PM  [facility] Pager: [phone]

## 2011-09-18 NOTE — Evaluation (Signed)
Physical Therapy Evaluation Patient Details Name: Norma [name] MRN: [id number] DOB: 1945-10-29 Today's Date: 09/18/2011  Problem List:  Patient Active Problem List  Diagnoses  . PAD (peripheral artery disease)  . Coronary artery disease  . Hyperlipidemia  . Hypertension  . Diabetes mellitus type 2, insulin dependent  . Respiratory failure, acute  . S/P aorto-bifemoral bypass surgery  . Acute on chronic kidney disease, stage 4  . Leucocytosis  . Edema  . Peripheral vascular disease, unspecified  . Claudication  . Diarrhea    Past Medical History:  Past Medical History  Diagnosis Date  . Claudication   . Diabetes mellitus   . Hypertension   . Hyperlipidemia   . DJD (degenerative joint disease)   . PAD (peripheral artery disease)   . Coronary artery disease   . Leg pain   . Carotid artery occlusion   . Obesity   . DDD (degenerative disc disease)   . DJD (degenerative joint disease)   . Shortness of breath     exertion  . Anxiety   . Diabetic coma   . Edema    Past Surgical History:  Past Surgical History  Procedure Date  . Cardiac catheterization 12/01/2009    EF 65%  . Removal of fibrous cyst from right breast 10+ YEARS  . Retinal detachment surgery 10+ YEARS    LEFT EYE  . Coronary artery bypass graft 12/08/2009    LIMA GRAFT TO THE DISTAL LAD AND SAPHENOUS VEIN GRAFT TO THE OBTUSE MARGINAL VESSEL  . Transthoracic echocardiogram 12/01/2009    EF 60-65%  . Cardiovascular stress test 11/28/2009    EF 75%  . Aorta - bilateral femoral artery bypass graft 05/25/2011    Procedure: AORTA BIFEMORAL BYPASS GRAFT;  Surgeon: Mal Misty, MD;  Location: [facility] OR;  Service: Vascular;  Laterality: N/A;  . Pr vein bypass graft,aorto-fem-pop 05/25/11    PT Assessment/Plan/Recommendation PT Assessment Clinical Impression Statement: Pt admitted with N/V/D and currently fatigued from bowel discomfort and frequency of voiding. Pt states dgtr available 24hrs a day and pt was  recently at [facility] after November [facility] admission until Jan 21. Pt plans to return home with dgtr assist and will benefit from acute therapy to maximize function and decrease burden of care. Pt denied further ambulation, activity or exercise at this time. PT Recommendation/Assessment: Patient will need skilled PT in the acute care venue PT Problem List: Decreased activity tolerance;Decreased mobility Barriers to Discharge: None PT Therapy Diagnosis : Abnormality of gait PT Plan PT Frequency: Min 3X/week PT Treatment/Interventions: Gait training;Functional mobility training;Therapeutic exercise;Therapeutic activities;Patient/family education PT Recommendation Follow Up Recommendations: Home health PT Equipment Recommended: None recommended by PT PT Goals  Acute Rehab PT Goals PT Goal Formulation: With patient Time For Goal Achievement: 2 weeks Pt will go Supine/Side to Sit: with modified independence PT Goal: Supine/Side to Sit - Progress: Goal set today Pt will go Sit to Supine/Side: with modified independence PT Goal: Sit to Supine/Side - Progress: Goal set today Pt will go Sit to Stand: with modified independence PT Goal: Sit to Stand - Progress: Goal set today Pt will go Stand to Sit: with modified independence PT Goal: Stand to Sit - Progress: Goal set today Pt will Ambulate: 51 - 150 feet;with least restrictive assistive device;with modified independence PT Goal: Ambulate - Progress: Goal set today  PT Evaluation Precautions/Restrictions  Precautions Precautions: Fall Prior Functioning  Home Living Lives With: Daughter Receives Help From: Family Type of Home: [place]  Layout: One level Home Access: Stairs to enter;Ramped entrance Entrance Stairs-Number of Steps: 2 Bathroom Shower/Tub: Tub/shower unit;Curtain Biochemist, clinical: Standard Home Adaptive Equipment: Walker - rolling;Walker - four wheeled;Straight cane;Grab bars in shower;Grab bars around toilet;Shower  chair with back Prior Function Level of Independence: Independent with basic ADLs;Independent with transfers;Independent with gait;Needs assistance with homemaking Meal Prep: Maximal Light Housekeeping: Maximal Driving: No Vocation: Other (comment) (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) Cognition Cognition Arousal/Alertness: Awake/alert Overall Cognitive Status: Appears within functional limits for tasks assessed Sensation/Coordination Sensation Light Touch: Not tested Extremity Assessment RLE Assessment RLE Assessment: Exceptions to [facility] RLE AROM (degrees) RLE Overall AROM Comments: grossly 3/5 knee extension and hip flexion, pt denied formal testing LLE Assessment LLE Assessment: Exceptions to WFL LLE AROM (degrees) LLE Overall AROM Comments: grossly 3/5 knee extension and hip flexion, pt denied formal testing Mobility (including Balance) Bed Mobility Bed Mobility: Yes Sit to Supine: HOB flat;4: Min assist Sit to Supine - Details (indicate cue type and reason): assist for bil LE into bed, pt unable to follow command for sit to sidelying to ease transfer Transfers Transfers: Yes Sit to Stand: From chair/3-in-1;From bed;5: Supervision Sit to Stand Details (indicate cue type and reason): cueing for safety and lines from [facility] x 2 and bed x1 Stand to Sit: To chair/3-in-1;To bed;5: Supervision Stand to Sit Details: cueing for lines and safety Stand Pivot Transfers: 5: Supervision Stand Pivot Transfer Details (indicate cue type and reason): bed to [facility] Ambulation/Gait Ambulation/Gait: Yes Ambulation/Gait Assistance: 6: Modified independent (Device/Increase time) Ambulation Distance (Feet): 10 Feet Assistive device: None;Other (Comment) Gait Pattern: Decreased stride length;Trunk flexed Stairs: No    Exercise    End of Session PT - End of Session Activity Tolerance: Patient limited by fatigue Patient left: in bed;with call bell in reach General Behavior During Session:  [facility] for tasks performed Cognition: [facility] for tasks performed  Melford Aase 09/18/2011, 3:26 PM  Elwyn Reach, [place]

## 2011-09-19 LAB — CBC
HCT: 31.1 % — ABNORMAL LOW (ref 36.0–46.0)
Hemoglobin: 10.1 g/dL — ABNORMAL LOW (ref 12.0–15.0)
MCH: 29.4 pg (ref 26.0–34.0)
MCV: 90.7 fL (ref 78.0–100.0)
Platelets: 158 10*3/uL (ref 150–400)
RBC: 3.43 MIL/uL — ABNORMAL LOW (ref 3.87–5.11)

## 2011-09-19 LAB — BASIC METABOLIC PANEL
BUN: 63 mg/dL — ABNORMAL HIGH (ref 6–23)
CO2: 23 mEq/L (ref 19–32)
Calcium: 8.8 mg/dL (ref 8.4–10.5)
Creatinine, Ser: 1.87 mg/dL — ABNORMAL HIGH (ref 0.50–1.10)
Glucose, Bld: 273 mg/dL — ABNORMAL HIGH (ref 70–99)

## 2011-09-19 LAB — GLUCOSE, CAPILLARY
Glucose-Capillary: 231 mg/dL — ABNORMAL HIGH (ref 70–99)
Glucose-Capillary: 231 mg/dL — ABNORMAL HIGH (ref 70–99)
Glucose-Capillary: 230 mg/dL — ABNORMAL HIGH (ref 70–99)

## 2011-09-19 MED ORDER — INSULIN GLARGINE 100 UNIT/ML ~~LOC~~ SOLN
8.0000 [IU] | Freq: Every day | SUBCUTANEOUS | Status: DC
  Administered 2011-09-19: 8 [IU] via SUBCUTANEOUS

## 2011-09-19 NOTE — Progress Notes (Signed)
Speech Language/Pathology Clinical/Bedside Swallow Evaluation Patient Details  Name: Norma [name] MRN: [id number] DOB: 1945-08-09 Today's Date: 09/19/2011  Past Medical History:  Past Medical History  Diagnosis Date  . Claudication   . Diabetes mellitus   . Hypertension   . Hyperlipidemia   . DJD (degenerative joint disease)   . PAD (peripheral artery disease)   . Coronary artery disease   . Leg pain   . Carotid artery occlusion   . Obesity   . DDD (degenerative disc disease)   . DJD (degenerative joint disease)   . Shortness of breath     exertion  . Anxiety   . Diabetic coma   . Edema    Past Surgical History:  Past Surgical History  Procedure Date  . Cardiac catheterization 12/01/2009    EF 65%  . Removal of fibrous cyst from right breast 10+ YEARS  . Retinal detachment surgery 10+ YEARS    LEFT EYE  . Coronary artery bypass graft 12/08/2009    LIMA GRAFT TO THE DISTAL LAD AND SAPHENOUS VEIN GRAFT TO THE OBTUSE MARGINAL VESSEL  . Transthoracic echocardiogram 12/01/2009    EF 60-65%  . Cardiovascular stress test 11/28/2009    EF 75%  . Aorta - bilateral femoral artery bypass graft 05/25/2011    Procedure: AORTA BIFEMORAL BYPASS GRAFT;  Surgeon: Mal Misty, MD;  Location: [facility] OR;  Service: Vascular;  Laterality: N/A;  . Pr vein bypass graft,aorto-fem-pop 05/25/11   HPI:  Pt is a 66 year old fmale admitted with one week of nausea and vomiting. Pt reports excessive foul tasting saliva that she frequently spits out. PMH:66 y.o. female with  past medical history of Claudication; Diabetes mellitus; Hypertension; Hyperlipidemia; DJD (degenerative joint disease); PAD (peripheral artery disease); Coronary artery disease; Leg pain; Carotid artery occlusion; Obesity    Assessment/Recommendations/Treatment Plan    SLP Assessment Clinical Impression Statement: Pt presents with normal swallow function, no s/s of aspiration or dysphagia. Pt does complain of copious foul tasting  saliva. Pt is however able to manage these secretions. Pt also complains of the taste of food being unpleasant and causing her to feel nauseated. The pts pharyngeal function however is normal and pt does not exhibit any signs of esophaeal dysphagia. Recommend continuing diet and deferring this problem to medical management. No SLP f/u needed.  Risk for Aspiration: None  Swallow Evaluation Recommendations Recommended Consults: Consider GI evaluation Solid Consistency: Regular Liquid Consistency: Thin Liquid Administration via: Straw;Cup Medication Administration: Whole meds with liquid Supervision: Patient able to self feed Postural Changes and/or Swallow Maneuvers: Seated upright 90 degrees Oral Care Recommendations: Oral care BID Follow up Recommendations: None  Treatment Plan Treatment Plan Recommendations: No treatment recommended at this time    [facility], [place] [id number]  Lynann Beaver 09/19/2011,9:49 AM

## 2011-09-19 NOTE — Progress Notes (Signed)
Patient ID: [name], female   DOB: Oct 26, 1945, 65 y.o.   MRN: [id number]  Assessment/Plan:  66 y.o. female with  past medical history of Claudication; Diabetes mellitus; Hypertension; Hyperlipidemia; DJD (degenerative joint disease); PAD (peripheral artery disease); Coronary artery disease; Leg pain; Carotid artery occlusion; Obesity admitted for 1 week of diarrhea and some nausea and vomiting.   Active Problems:   *Diarrhea/Dyspepsia Likely viral gastroenteritis, improving S/p speech evaluation today  Acute on chronic kidney disease, stage 4 - continue to monitor while in hospital Improved, DC iVF   Hypertension - BP at goal - continue hydralazine and coreg   Diabetes mellitus type 2, insulin dependent - sliding scale insulin Resume lantus 8Units Qhs  Dispo: home with [facility] PT ? Tomorrow  Subjective: Diarrhea/vomiting better, complains of issues with too much saliva and gagging  Objective:  Vital signs in last 24 hours:  Filed Vitals:   09/18/11 2123 09/19/11 0455 09/19/11 0824 09/19/11 1000  BP: 123/43 134/38 111/57 109/44  Pulse: 72 84 71 54  Temp: 98.8 F (37.1 C) 98.6 F (37 C)  98.4 F (36.9 C)  TempSrc: Oral Oral  Oral  Resp: 18 18  18   Height: 5\' 4"  (1.626 m)     Weight: 85.8 kg (189 lb 2.5 oz)     SpO2: 95% 97%  95%    Intake/Output from previous day:   Intake/Output Summary (Last 24 hours) at 09/19/11 1332 Last data filed at 09/19/11 0900  Gross per 24 hour  Intake    720 ml  Output      0 ml  Net    720 ml    Physical Exam: General: Alert, awake, oriented x3, in no acute distress. HEENT: No bruits, no goiter. Moist mucous membranes, no scleral icterus, no conjunctival pallor. Heart: Regular rate and rhythm, S1/S2 +, no murmurs, rubs, gallops. Lungs: Clear to auscultation bilaterally. No wheezing, no rhonchi, no rales.  Abdomen: Soft, nontender, nondistended, positive bowel sounds. Extremities: No clubbing or cyanosis, no pitting edema,   positive pedal pulses. Neuro: Grossly nonfocal.  Lab Results:  Basic Metabolic Panel:    Component Value Date/Time   NA 140 09/19/2011 0500   K 4.0 09/19/2011 0500   CL 105 09/19/2011 0500   CO2 23 09/19/2011 0500   BUN 63* 09/19/2011 0500   CREATININE 1.87* 09/19/2011 0500   GLUCOSE 273* 09/19/2011 0500   CALCIUM 8.8 09/19/2011 0500   CBC:    Component Value Date/Time   WBC 7.0 09/19/2011 0500   HGB 10.1* 09/19/2011 0500   HCT 31.1* 09/19/2011 0500   PLT 158 09/19/2011 0500   MCV 90.7 09/19/2011 0500   NEUTROABS 5.5 09/17/2011 2148   LYMPHSABS 1.0 09/17/2011 2148   MONOABS 0.4 09/17/2011 2148   EOSABS 0.0 09/17/2011 2148   BASOSABS 0.0 09/17/2011 2148      Lab 09/19/11 0500 09/18/11 0555 09/18/11 0123 09/17/11 2148  WBC 7.0 8.8 -- 6.8  HGB 10.1* 12.5 13.3 12.9  HCT 31.1* 37.7 39.0 40.4  PLT 158 215 -- 191  MCV 90.7 89.5 -- 93.5  MCH 29.4 29.7 -- 29.9  MCHC 32.5 33.2 -- 31.9  RDW 14.2 14.1 -- 14.2  LYMPHSABS -- -- -- 1.0  MONOABS -- -- -- 0.4  EOSABS -- -- -- 0.0  BASOSABS -- -- -- 0.0  BANDABS -- -- -- --    Lab 09/19/11 0500 09/18/11 0555 09/18/11 0123 09/17/11 2148  NA 140 142 136 131*  K 4.0 3.6 4.5  4.8  CL 105 102 104 91*  CO2 23 23 -- 23  GLUCOSE 273* 280* >700* 888*  BUN 63* 75* 76* 75*  CREATININE 1.87* 2.33* 2.30* 2.27*  CALCIUM 8.8 10.1 -- 9.8  MG -- 2.2 -- --    Studies/Results: Dg Chest 2 View 09-26-11 IMPRESSION: Cardiomegaly without edema.     Medications: Scheduled Meds:   . aspirin  325 mg Oral Daily  . carvedilol  25 mg Oral BID WC  . enoxaparin  40 mg Subcutaneous Daily  . ferrous sulfate  325 mg Oral BID WC  . hydrALAZINE  50 mg Oral Q8H  . insulin aspart  0-15 Units Subcutaneous TID WC  . insulin aspart  0-5 Units Subcutaneous QHS  . insulin glargine  5 Units Subcutaneous Once  . pantoprazole (PROTONIX) IV  40 mg Intravenous Q24H  . simvastatin  40 mg Oral q1800   Continuous Infusions:    . DISCONTD: sodium chloride 100 mL/hr at  09/19/11 1018  . DISCONTD: dextrose 5 % and 0.45% NaCl 100 mL/hr at 09-26-2011 1900  . DISCONTD: insulin (NOVOLIN-R) infusion 5.4 Units/hr (26-Sep-2011 0206)   PRN Meds:.acetaminophen, acetaminophen, albuterol, alum & mag hydroxide-simeth, dextrose, guaiFENesin-dextromethorphan, HYDROcodone-acetaminophen, ondansetron (ZOFRAN) IV, ondansetron   LOS: 2 days   Goldman Birchall 09/19/2011, 1:31 PM  [facility] Pager: [phone]

## 2011-09-20 LAB — BASIC METABOLIC PANEL
CO2: 22 mEq/L (ref 19–32)
Calcium: 9.1 mg/dL (ref 8.4–10.5)
Chloride: 105 mEq/L (ref 96–112)
GFR calc Af Amer: 47 mL/min — ABNORMAL LOW (ref >90)
Sodium: 137 mEq/L (ref 135–145)

## 2011-09-20 LAB — CBC
Platelets: 143 10*3/uL — ABNORMAL LOW (ref 150–400)
RBC: 3.37 MIL/uL — ABNORMAL LOW (ref 3.87–5.11)
RDW: 14.2 % (ref 11.5–15.5)
WBC: 5.5 10*3/uL (ref 4.0–10.5)

## 2011-09-20 LAB — GLUCOSE, CAPILLARY: Glucose-Capillary: 277 mg/dL — ABNORMAL HIGH (ref 70–99)

## 2011-09-20 MED ORDER — PANTOPRAZOLE SODIUM 40 MG PO TBEC: 40.0000 mg | DELAYED_RELEASE_TABLET | Freq: Every day | ORAL | Status: DC

## 2011-09-20 MED ORDER — FUROSEMIDE 20 MG PO TABS: 40.0000 mg | ORAL_TABLET | Freq: Two times a day (BID) | ORAL | Status: DC

## 2011-09-20 MED ORDER — INSULIN GLARGINE 100 UNIT/ML ~~LOC~~ SOLN: 6.0000 [IU] | Freq: Two times a day (BID) | SUBCUTANEOUS | Status: DC

## 2011-09-20 MED ORDER — FERROUS SULFATE 325 (65 FE) MG PO TABS: 325.0000 mg | ORAL_TABLET | Freq: Two times a day (BID) | ORAL | Status: DC

## 2011-09-20 NOTE — Discharge Summary (Signed)
Physician Discharge Summary  Patient ID: Norma [name] MRN: [id number] DOB/AGE: [birth year] 66 y.o.  Admit date: 09/17/2011 Discharge date: 09/20/2011  Primary Care Physician:  [name]., MD, MD   Discharge Diagnoses:   1. Viral gastroenteritis 2. CAD status post CABG 3.  Hypertension 4.  Diabetes mellitus type 2, insulin dependent 5. Acute on chronic kidney disease, stage 4 improved 6. peripheral vascular disease status post bilateral femoral artery bypass graft 7. history of carotid artery occlusion 8. degenerative joint 9. obesity 10. Uncontrolled hyperglycemia improved   Medication List  As of 09/20/2011  2:22 PM   TAKE these medications         acetaminophen 325 MG tablet   Commonly known as: TYLENOL   Take 325-650 mg by mouth every 6 (six) hours as needed. For pain      acetaminophen 325 MG tablet   Commonly known as: TYLENOL   Take 325-650 mg by mouth every 4 (four) hours as needed. For pain/fever      aspirin 325 MG tablet   Take 325 mg by mouth daily.      atorvastatin 20 MG tablet   Commonly known as: LIPITOR   Take 20 mg by mouth daily.      CALCIUM + D PO   Take by mouth daily.      carvedilol 25 MG tablet   Commonly known as: COREG   Take 1 tablet (25 mg total) by mouth 2 (two) times daily.      Coenzyme Q10 200 MG capsule   Take 200 mg by mouth daily.      docusate sodium 100 MG capsule   Commonly known as: COLACE   Take 100 mg by mouth daily.      docusate 50 MG/5ML liquid   Commonly known as: COLACE   Take 10 mLs (100 mg total) by mouth daily.      ferrous sulfate 325 (65 FE) MG tablet   Take 1 tablet (325 mg total) by mouth 2 (two) times daily with a meal. Restart in 1 week   Start taking on: 09/27/2011      fish oil-omega-3 fatty acids 1000 MG capsule   Take 1 g by mouth 2 (two) times daily.      furosemide 20 MG tablet   Commonly known as: LASIX   Take 2 tablets (40 mg total) by mouth 2 (two) times daily.      hydrALAZINE  50 MG tablet   Commonly known as: APRESOLINE   Take 50 mg by mouth 3 (three) times daily.      HYDROcodone-acetaminophen 5-500 MG per tablet   Commonly known as: VICODIN   Take 1 tablet by mouth every 6 (six) hours as needed.      insulin aspart 100 UNIT/ML injection   Commonly known as: novoLOG   Inject 0-9 Units into the skin 3 (three) times daily with meals. CBG 70 - 120: 0 units  CBG 121 - 150: 1 unit  CBG 151 - 200: 2 units  CBG 201 - 250: 3 units  CBG 251 - 300: 5 units  CBG 301 - 350: 7 units and call MD        insulin aspart 100 UNIT/ML injection   Commonly known as: novoLOG   Inject 0-5 Units into the skin at bedtime. CBG 70 - 120: 0 units  CBG 121 - 150: 0 units  CBG 151 - 200: 0 units  CBG 201 - 250: 2 units  CBG  251 - 300: 3 units  CBG 301 - 350: 4 units and call MD        insulin glargine 100 UNIT/ML injection   Commonly known as: LANTUS   Inject 6-12 Units into the skin 2 (two) times daily. 6 Units in am and 12Units in pm      magnesium aspartate 615 MG tablet   Commonly known as: MAGINEX   Take 615 mg by mouth 2 (two) times daily.      multivitamin tablet   Take 1 tablet by mouth daily.      oxyCODONE-acetaminophen 5-325 MG per tablet   Commonly known as: PERCOCET   Take 1-2 tablets by mouth every 4 (four) hours as needed. For pain      pantoprazole 40 MG tablet   Commonly known as: PROTONIX   Take 1 tablet (40 mg total) by mouth at bedtime.      polyethylene glycol packet   Commonly known as: MIRALAX / GLYCOLAX   Take 17 g by mouth daily as needed. For constipation      potassium chloride SA 20 MEQ tablet   Commonly known as: K-DUR,KLOR-CON   Take 20 mEq by mouth daily.             Disposition and Follow-up:  PCP Dr. Karlton Lemon in one  Significant Diagnostic Studies:  Dg Chest 2 View  09/18/2011  *RADIOLOGY REPORT*  Clinical Data: Short of breath  CHEST - 2 VIEW  Comparison: 05/29/2011  Findings: Right neck introducer sheath  has been removed.  Heart is mildly enlarged.  Lungs are clear.  No pneumothorax or pleural effusion.  IMPRESSION: Cardiomegaly without edema.  Original Report Authenticated By: Jamas Lav, M.D.    Brief H and P: Norma [name] is a 66 year old female with multiple medical problems presented to the ER with 1 week of diarrhea a few stools per day but liquid, some nausea and a little vomiting, no chest pain. No blood in stool. No sick contacts.  She have been laying in bed and not checking her blood sugar. Did not noticed increased in urination. Her PCP Called and she was told to come into ER because she probably is dehydrated in ED was found to have Bg >700  Have had some dry mucous in the back of her throat and not feeling well for few days now.   Hospital Course:  1. Viral gastroenteritis: Treated with supportive care IV fluids, antiemetics and pain control. C. difficile PCR came back negative and rest of stool cultures were negative as well Currently tolerating a regular diet. 2. acute renal failure on chronic kidney disease stage IV: Worsened by dehydration related to diarrhea, rehydrated with improvement in her creatinine from 2.2  to 1.4 at the time of discharge. She will restart her Lasix at a lower dose, i.e. 40 mg twice a day. Is advised to followup with her primary care doctor in one week Rest of chronic medical problems remain stable  Time spent on Discharge: 37min  Signed: Eshaal Duby [facility]  09/20/2011, 2:22 PM

## 2011-09-20 NOTE — Progress Notes (Signed)
   CARE MANAGEMENT NOTE 09/20/2011  Patient:  Norma [name], Norma [name]   Account Number:  [id number]  Date Initiated:  09/20/2011  Documentation initiated by:  Lars Pinks  Subjective/Objective Assessment:   PT WAS ADMITTED WITH DIARRHEA AND DEHYDRATION     Action/Plan:   PROGRESSION OF CARE AND DISCHARGE PLANNING   Anticipated DC Date:  09/20/2011   Anticipated DC Plan:  [place]  CM consult      Choice offered to / List presented to:             Status of service:  Completed, signed off Medicare Important Message given?   (If response is "NO", the following Medicare IM given date fields will be blank) Date Medicare IM given:   Date Additional Medicare IM given:    Discharge Disposition:  HOME/SELF CARE  Per UR Regulation:    If discussed at Long Length of Stay Meetings, dates discussed:    Comments:  09/20/11 Lars Pinks, RN,BSN [place].  PTA PT WAS DC'D FROM [facility].  PT REFUSED RECOMMENDED HH PT STATING THAT SHE HAS HAD PT AND KNOWS ALL ABOUT IT AND HAS DME'S. PT IS TO BE DC'D TO HOME TODAY.

## 2011-09-20 NOTE — Progress Notes (Signed)
PHARMACIST - PHYSICIAN COMMUNICATION DR:   Broadus John CONCERNING: Protonix IV to Oral Route Change Policy  RECOMMENDATION: This patient is receiving Protonix by the intravenous route.  Based on criteria approved by the Pharmacy and Therapeutics Committee, this drug is being converted to the equivalent oral dose form(s).   DESCRIPTION: These criteria include:  The patient is eating (either orally or via tube) and/or has been taking other orally administered medications for a least 24 hours  There is no active GI bleed noted  If you have questions about this conversion, please contact the Pharmacy Department  []   [phone] )  [facility] [x]   [phone] )  [facility]  []   [phone] )  [facility] []   [phone] )  [place], PharmD, BCPS 09/20/2011 2:12 PM

## 2011-09-20 NOTE — Progress Notes (Signed)
PT Cancellation Note  Treatment cancelled today due to being discharged today with daughter to assist.  Reports no equipment needs.Magda Kiel, PT [phone] 09/20/2011, 2:42 PM

## 2011-09-21 ENCOUNTER — Ambulatory Visit: Payer: Medicare Other | Admitting: Nurse Practitioner

## 2011-09-23 LAB — STOOL CULTURE

## 2011-10-16 ENCOUNTER — Ambulatory Visit: Payer: Medicare Other | Admitting: Neurosurgery

## 2011-10-16 ENCOUNTER — Other Ambulatory Visit: Payer: Medicare Other

## 2011-10-23 ENCOUNTER — Telehealth: Payer: Self-pay | Admitting: Cardiology

## 2011-10-23 NOTE — Telephone Encounter (Signed)
Pt has questions re last visit with lori

## 2011-10-23 NOTE — Telephone Encounter (Signed)
Patient called no answer.LMTC. 

## 2011-10-24 NOTE — Telephone Encounter (Signed)
Patient called stating she missed her last appointment with Truitt Merle NP.Appointment rescheduled with Cecille Rubin 11/21/11.

## 2011-11-20 ENCOUNTER — Other Ambulatory Visit: Payer: Self-pay | Admitting: Internal Medicine

## 2011-11-20 DIAGNOSIS — Z1231 Encounter for screening mammogram for malignant neoplasm of breast: Secondary | ICD-10-CM

## 2011-11-21 ENCOUNTER — Ambulatory Visit: Payer: Medicare Other | Admitting: Nurse Practitioner

## 2011-12-04 ENCOUNTER — Ambulatory Visit: Payer: Medicare Other | Admitting: Nurse Practitioner

## 2011-12-18 ENCOUNTER — Telehealth: Payer: Self-pay | Admitting: Nurse Practitioner

## 2011-12-18 NOTE — Telephone Encounter (Signed)
Spoke with pt, she had a follow up with lori gerhardt np tomorrow but rescheduled it out to the end of the month. The pt states she was seen by her PCP today and her fluid statis is good. The PCP has put her on a different med regimen and the pt wants to know if the appt is even necessary. Will discuss with lori gerhardt np.

## 2011-12-18 NOTE — Telephone Encounter (Signed)
Patient would like return call from [place]

## 2011-12-19 ENCOUNTER — Ambulatory Visit: Payer: Medicare Other | Admitting: Nurse Practitioner

## 2011-12-20 NOTE — Telephone Encounter (Signed)
Will forward to [place].

## 2011-12-20 NOTE — Telephone Encounter (Signed)
She can reschedule for a couple of months if she would like. Tell her to call for any problems. Will be happy to see her as needed.  Thanks

## 2011-12-20 NOTE — Telephone Encounter (Signed)
Left message for pt to schedule appt with Truitt Merle in 2-3 months for follow-up.

## 2011-12-21 ENCOUNTER — Ambulatory Visit: Payer: Medicare Other

## 2012-01-01 ENCOUNTER — Ambulatory Visit: Payer: Medicare Other | Admitting: Nurse Practitioner

## 2012-01-08 ENCOUNTER — Ambulatory Visit
Admission: RE | Admit: 2012-01-08 | Discharge: 2012-01-08 | Disposition: A | Discharge status: Home / Self Care | Payer: Medicare Other | Source: Ambulatory Visit | Attending: Internal Medicine | Admitting: Internal Medicine

## 2012-01-08 DIAGNOSIS — Z1231 Encounter for screening mammogram for malignant neoplasm of breast: Secondary | ICD-10-CM

## 2012-01-09 ENCOUNTER — Other Ambulatory Visit: Payer: Self-pay | Admitting: Internal Medicine

## 2012-01-09 DIAGNOSIS — R928 Other abnormal and inconclusive findings on diagnostic imaging of breast: Secondary | ICD-10-CM

## 2012-01-25 ENCOUNTER — Other Ambulatory Visit: Payer: Self-pay | Admitting: Internal Medicine

## 2012-01-25 ENCOUNTER — Ambulatory Visit
Admission: RE | Admit: 2012-01-25 | Discharge: 2012-01-25 | Disposition: A | Discharge status: Home / Self Care | Payer: Medicare Other | Source: Ambulatory Visit | Attending: Internal Medicine | Admitting: Internal Medicine

## 2012-01-25 DIAGNOSIS — R928 Other abnormal and inconclusive findings on diagnostic imaging of breast: Secondary | ICD-10-CM

## 2012-07-31 ENCOUNTER — Other Ambulatory Visit: Payer: Self-pay | Admitting: Internal Medicine

## 2012-07-31 DIAGNOSIS — N6310 Unspecified lump in the right breast, unspecified quadrant: Secondary | ICD-10-CM

## 2012-08-28 ENCOUNTER — Ambulatory Visit (INDEPENDENT_AMBULATORY_CARE_PROVIDER_SITE_OTHER): Payer: Medicare Other | Admitting: Cardiology

## 2012-08-28 ENCOUNTER — Encounter: Payer: Self-pay | Admitting: Cardiology

## 2012-08-28 ENCOUNTER — Telehealth: Payer: Self-pay | Admitting: Cardiology

## 2012-08-28 VITALS — BP 162/80 | HR 50 | Ht 64.0 in | Wt 232.8 lb

## 2012-08-28 DIAGNOSIS — I10 Essential (primary) hypertension: Secondary | ICD-10-CM

## 2012-08-28 DIAGNOSIS — R0602 Shortness of breath: Secondary | ICD-10-CM

## 2012-08-28 DIAGNOSIS — E119 Type 2 diabetes mellitus without complications: Secondary | ICD-10-CM

## 2012-08-28 DIAGNOSIS — I2510 Atherosclerotic heart disease of native coronary artery without angina pectoris: Secondary | ICD-10-CM

## 2012-08-28 DIAGNOSIS — Z794 Long term (current) use of insulin: Secondary | ICD-10-CM

## 2012-08-28 DIAGNOSIS — R609 Edema, unspecified: Secondary | ICD-10-CM

## 2012-08-28 DIAGNOSIS — I739 Peripheral vascular disease, unspecified: Secondary | ICD-10-CM

## 2012-08-28 MED ORDER — FUROSEMIDE 80 MG PO TABS: 80.0000 mg | ORAL_TABLET | Freq: Two times a day (BID) | ORAL | Status: DC

## 2012-08-28 MED ORDER — CARVEDILOL 12.5 MG PO TABS: 12.5000 mg | ORAL_TABLET | Freq: Two times a day (BID) | ORAL | Status: DC

## 2012-08-28 NOTE — Patient Instructions (Signed)
Reduce carvedilol ( Coreg ) to 12.5 mg twice a day.  We will increase your lasix to 80 mg twice a day.  We will see you in 2 weeks with lab work  We will schedule an Echocardiogram  Restrict your salt intake and fluid intake 1.5 liters/day

## 2012-08-28 NOTE — Telephone Encounter (Signed)
NeW Problem   Pt calling in needing clarification on dosage of her Lasix.

## 2012-08-28 NOTE — Telephone Encounter (Signed)
Spoke to patient stated she saw Dr.Jordan this morning and he increased lasix.States she thought he said take lasix 80 mg daily.States on her instructions it said take lasix

## 2012-08-28 NOTE — Telephone Encounter (Signed)
Follow-up:     Patient called in wanting to expedite her receiving a call back about her initial call.  Please call back.

## 2012-08-28 NOTE — Telephone Encounter (Signed)
Spoke with patient she stated she wanted to verify lasix instructions.Patient was told increase lasix to 80 mg twice a day.Prescription sent to pharmacy.

## 2012-08-29 NOTE — Progress Notes (Signed)
Norma [name] Date of Birth: 23-Apr-1946 Medical Record [id number]  History of Present Illness: Norma [name] is seen for evaluation. She was last seen over a year ago. She had complex vascular surgery in November 2012 and was slow to recover. She also had coronary bypass surgery in June of 2011. Her major problem has been with lower extremity edema. She was taking Lasix 20 mg twice a day. This was recently increased to 3 times a day. According to our scale she has gained 20 pounds. She is currently being treated with leg wraps and an Haematologist. This has helped with the edema below her knees but she still has significant edema in her thighs. She denies any significant shortness of breath or chest pain. Her home health nurse has noted that her heart rate will sometimes dipped into the 40s. Patient is now caring for herself and driving. She is living with her daughter.  Current Outpatient Prescriptions on File Prior to Visit  Medication Sig Dispense Refill  . acetaminophen (TYLENOL) 325 MG tablet Take 325-650 mg by mouth every 6 (six) hours as needed. For pain      . aspirin 325 MG tablet Take 325 mg by mouth daily.        Marland Kitchen atorvastatin (LIPITOR) 20 MG tablet Take 20 mg by mouth daily.        . Coenzyme Q10 200 MG capsule Take 200 mg by mouth daily.        . ferrous sulfate 325 (65 FE) MG tablet Take 1 tablet (325 mg total) by mouth 2 (two) times daily with a meal. Restart in 1 week  30 tablet  0  . fish oil-omega-3 fatty acids 1000 MG capsule Take 1 g by mouth 2 (two) times daily.        . magnesium aspartate (MAGINEX) 615 MG tablet Take 500 mg by mouth 2 (two) times daily.        No current facility-administered medications on file prior to visit.    Allergies  Allergen Reactions  . Iohexol      Code: RASH, Desc: pt called 1 day post scanning stating that skin was red and "itching all over" some what better but still had symptom.. instructed pt to take benadryl to relieve symptoms,per dr  Alvester Chou.if any problems call back/mms, Onset Date: [id number]   . Sulfa Antibiotics     Past Medical History  Diagnosis Date  . Claudication   . Diabetes mellitus   . Hypertension   . Hyperlipidemia   . DJD (degenerative joint disease)   . PAD (peripheral artery disease)   . Coronary artery disease   . Leg pain   . Carotid artery occlusion   . Obesity   . DDD (degenerative disc disease)   . DJD (degenerative joint disease)   . Shortness of breath     exertion  . Anxiety   . Diabetic coma   . Edema     Past Surgical History  Procedure Laterality Date  . Cardiac catheterization  12/01/2009    EF 65%  . Removal of fibrous cyst from right breast  10+ YEARS  . Retinal detachment surgery  10+ YEARS    LEFT EYE  . Coronary artery bypass graft  12/08/2009    LIMA GRAFT TO THE DISTAL LAD AND SAPHENOUS VEIN GRAFT TO THE OBTUSE MARGINAL VESSEL  . Transthoracic echocardiogram  12/01/2009    EF 60-65%  . Cardiovascular stress test  11/28/2009    EF 75%  .  Aorta - bilateral femoral artery bypass graft  05/25/2011    Procedure: AORTA BIFEMORAL BYPASS GRAFT;  Surgeon: Mal Misty, MD;  Location: [facility];  Service: Vascular;  Laterality: N/A;  . Pr vein bypass graft,aorto-fem-pop  05/25/11    History  Smoking status  . Former Smoker -- 20 years  . Types: Cigarettes  . Quit date: 07/10/1991  Smokeless tobacco  . Never Used    History  Alcohol Use No    Family History  Problem Relation Age of Onset  . Hypertension Mother   . Coronary artery disease Father   . Hypertension Sister   . Cirrhosis Brother   . Kidney disease Daughter     Review of Systems: The review of systems is per the HPI.  All other systems were reviewed and are negative.  Physical Exam: BP 162/80  Pulse 50  Ht 5\' 4"  (1.626 m)  Wt 232 lb 12.8 oz (105.597 kg)  BMI 39.94 kg/m2  SpO2 98% Patient is very pleasant and in no acute distress.   Skin is warm and dry. Color is normal.  HEENT is unremarkable.  Normocephalic/atraumatic. PERRL. Sclera are nonicteric. Neck is supple. No masses. No JVD. Lungs are clear. Cardiac exam shows a regular rate and rhythm. Heart tones are distant. Abdomen is obese but soft. Extremities are with 3-4+ edema extending to the upper thighs She has some brawny stasis changes. Her legs are wrapped below the knees. Gait and ROM are intact. She is using a walker. No gross neurologic deficits noted.   LABORATORY DATA:   Assessment / Plan:  1. Marked lower extremity edema. This is mostly related to chronic venous stasis disease. She may have a component of diastolic dysfunction. I recommended increasing her Lasix to 80 mg twice a day. Continue leg wraps. I stressed the importance of sodium restriction and fluid restriction. We will update her echocardiogram. I'll followup in 2 weeks and we will check a basic metabolic panel and BNP level at that time.  2. Bradycardia. I will reduce her carvedilol to 12.5 mg twice a day. I would be satisfied with heart rates in the 50s.  3. Hypertension. I take her blood pressure will improve with improvement in her volume status.  4. Coronary disease status post CABG in June of 2011. No active angina.  5. Status post aortobifemoral bypass grafting. No significant claudication.

## 2012-09-01 ENCOUNTER — Telehealth: Payer: Self-pay | Admitting: Cardiology

## 2012-09-01 NOTE — Telephone Encounter (Signed)
Follow UP    Pt came to see Dr. Martinique last [place] 2/20. New prescriptions were supposed to be called in and she was calling to follow up on the status. Would like to speak to nurse.

## 2012-09-01 NOTE — Telephone Encounter (Signed)
Pt is aware that the prescription was send to the pharmacy. Pt states, the pharmacy called to let her know that she can peak up the medications.

## 2012-09-04 ENCOUNTER — Ambulatory Visit
Admission: RE | Admit: 2012-09-04 | Discharge: 2012-09-04 | Disposition: A | Discharge status: Home / Self Care | Payer: Medicare Other | Source: Ambulatory Visit | Attending: Internal Medicine | Admitting: Internal Medicine

## 2012-09-04 ENCOUNTER — Ambulatory Visit (HOSPITAL_COMMUNITY): Payer: Medicare Other | Attending: Cardiology

## 2012-09-04 DIAGNOSIS — R609 Edema, unspecified: Secondary | ICD-10-CM

## 2012-09-04 DIAGNOSIS — I2510 Atherosclerotic heart disease of native coronary artery without angina pectoris: Secondary | ICD-10-CM | POA: Insufficient documentation

## 2012-09-04 DIAGNOSIS — I10 Essential (primary) hypertension: Secondary | ICD-10-CM | POA: Insufficient documentation

## 2012-09-04 DIAGNOSIS — N6310 Unspecified lump in the right breast, unspecified quadrant: Secondary | ICD-10-CM

## 2012-09-04 DIAGNOSIS — R0602 Shortness of breath: Secondary | ICD-10-CM | POA: Insufficient documentation

## 2012-09-04 DIAGNOSIS — I739 Peripheral vascular disease, unspecified: Secondary | ICD-10-CM

## 2012-09-04 DIAGNOSIS — E119 Type 2 diabetes mellitus without complications: Secondary | ICD-10-CM | POA: Insufficient documentation

## 2012-09-04 NOTE — Progress Notes (Signed)
Echocardiogram performed.  

## 2012-09-11 ENCOUNTER — Telehealth: Payer: Self-pay | Admitting: Cardiology

## 2012-09-11 NOTE — Telephone Encounter (Signed)
Spoke to patient was told to remove leg wraps when she sees Lori on 09/15/12 and have home health nurse to wrap legs after appointment .

## 2012-09-11 NOTE — Telephone Encounter (Signed)
New Prob   Pts nurse from [facility] [place] has been putting una boots on her to wrap legs to help relieve fluid. Pt would like to know if she needs to have the wrapping boots on before her appt with Cecille Rubin on 3/10, or if Cecille Rubin would like to see her legs the way they are. Would like to speak to nurse.

## 2012-09-15 ENCOUNTER — Encounter: Payer: Self-pay | Admitting: Nurse Practitioner

## 2012-09-15 ENCOUNTER — Ambulatory Visit (INDEPENDENT_AMBULATORY_CARE_PROVIDER_SITE_OTHER): Payer: Medicare Other | Admitting: Nurse Practitioner

## 2012-09-15 ENCOUNTER — Other Ambulatory Visit (INDEPENDENT_AMBULATORY_CARE_PROVIDER_SITE_OTHER): Payer: Medicare Other

## 2012-09-15 VITALS — BP 150/76 | HR 50 | Ht 62.0 in | Wt 229.1 lb

## 2012-09-15 DIAGNOSIS — R0600 Dyspnea, unspecified: Secondary | ICD-10-CM

## 2012-09-15 DIAGNOSIS — R0609 Other forms of dyspnea: Secondary | ICD-10-CM

## 2012-09-15 DIAGNOSIS — I739 Peripheral vascular disease, unspecified: Secondary | ICD-10-CM

## 2012-09-15 DIAGNOSIS — R609 Edema, unspecified: Secondary | ICD-10-CM

## 2012-09-15 DIAGNOSIS — I2510 Atherosclerotic heart disease of native coronary artery without angina pectoris: Secondary | ICD-10-CM

## 2012-09-15 DIAGNOSIS — Z794 Long term (current) use of insulin: Secondary | ICD-10-CM

## 2012-09-15 DIAGNOSIS — R0602 Shortness of breath: Secondary | ICD-10-CM

## 2012-09-15 DIAGNOSIS — E119 Type 2 diabetes mellitus without complications: Secondary | ICD-10-CM

## 2012-09-15 DIAGNOSIS — I10 Essential (primary) hypertension: Secondary | ICD-10-CM

## 2012-09-15 LAB — BASIC METABOLIC PANEL
CO2: 28 mEq/L (ref 19–32)
Chloride: 108 mEq/L (ref 96–112)
Creatinine, Ser: 1.3 mg/dL — ABNORMAL HIGH (ref 0.4–1.2)
Potassium: 3.6 mEq/L (ref 3.5–5.1)

## 2012-09-15 LAB — BRAIN NATRIURETIC PEPTIDE: Pro B Natriuretic peptide (BNP): 219 pg/mL — ABNORMAL HIGH (ref 0.0–100.0)

## 2012-09-15 MED ORDER — METOLAZONE 2.5 MG PO TABS: 2.5000 mg | ORAL_TABLET | Freq: Every day | ORAL | Status: DC

## 2012-09-15 NOTE — Progress Notes (Signed)
Norma [name] Date of Birth: 1946-04-13 Medical Record [id number]  History of Present Illness: Ms. Kindig is seen back today for a 2 week check. She is seen for Dr. Martinique. She was here 2 weeks ago after a one year absence. She had complex vascular surgery in November of 2012 and was quite slow to recover and required a stay in the [facility]. She had CABG back in June of 2011. Has chronic swelling of her legs from venous insufficiency.   Saw Dr. Martinique 2 weeks ago. Weight was up. She had more edema. Had her legs wrapped and using Unna boots. He increased her Lasix and updated her echo. Her heart rate was lower and he reduced her Coreg to 12.5 mg BID and noted that HR in the 50's would be acceptable.   She comes back today. She is here alone. She lives with her daughter. She is doing ok. Her echo showed normal EF with moderate PHTN. Grade 1 diastolic dysfunction. Needs labs today (BMET & BNP). She feels ok. No chest pain. Not really short of breath. Still getting her legs wrapped and placement of the Unna boots. These were cut off this morning due to her visit with me. She says she is not using as much salt but still eating foods out of cans. Weight is only down 3 pounds. Still with considerable swelling in her thighs. Not dizzy or lightheaded. The visiting nurse has noted heart rates right at 50. She is elevating her legs during the day as well at night.   Current Outpatient Prescriptions on File Prior to Visit  Medication Sig Dispense Refill  . acetaminophen (TYLENOL) 325 MG tablet Take 325-650 mg by mouth every 6 (six) hours as needed. For pain      . aspirin 325 MG tablet Take 325 mg by mouth daily.        Marland Kitchen atorvastatin (LIPITOR) 20 MG tablet Take 20 mg by mouth daily.        . carvedilol (COREG) 12.5 MG tablet Take 1 tablet (12.5 mg total) by mouth 2 (two) times daily.  180 tablet  3  . Cholecalciferol (VITAMIN D3) 2000 UNITS TABS Take 2,000 Units by mouth 1 day or 1 dose.      . Coenzyme Q10 200  MG capsule Take 200 mg by mouth daily.        . ferrous sulfate 325 (65 FE) MG tablet Take 1 tablet (325 mg total) by mouth 2 (two) times daily with a meal. Restart in 1 week  30 tablet  0  . fish oil-omega-3 fatty acids 1000 MG capsule Take 1 g by mouth 2 (two) times daily.        . furosemide (LASIX) 80 MG tablet Take 1 tablet (80 mg total) by mouth 2 (two) times daily.  60 tablet  6  . insulin glargine (LANTUS) 100 UNIT/ML injection Inject 15 Units into the skin 2 (two) times daily.       . Liraglutide (VICTOZA [place]) Inject 1.8 Units into the skin.      Marland Kitchen losartan-hydrochlorothiazide (HYZAAR) 100-12.5 MG per tablet Take 1 tablet by mouth daily.      . magnesium aspartate (MAGINEX) 615 MG tablet Take 500 mg by mouth 2 (two) times daily.        No current facility-administered medications on file prior to visit.    Allergies  Allergen Reactions  . Iohexol      Code: RASH, Desc: pt called 1 day post scanning  stating that skin was red and "itching all over" some what better but still had symptom.. instructed pt to take benadryl to relieve symptoms,per dr Alvester Chou.if any problems call back/mms, Onset Date: [id number]   . Sulfa Antibiotics     Past Medical History  Diagnosis Date  . Claudication   . Diabetes mellitus   . Hypertension   . Hyperlipidemia   . DJD (degenerative joint disease)   . PAD (peripheral artery disease)   . Coronary artery disease   . Leg pain   . Carotid artery occlusion   . Obesity   . DDD (degenerative disc disease)   . DJD (degenerative joint disease)   . Shortness of breath     exertion  . Anxiety   . Diabetic coma   . Edema     Past Surgical History  Procedure Laterality Date  . Cardiac catheterization  12/01/2009    EF 65%  . Removal of fibrous cyst from right breast  10+ YEARS  . Retinal detachment surgery  10+ YEARS    LEFT EYE  . Coronary artery bypass graft  12/08/2009    LIMA GRAFT TO THE DISTAL LAD AND SAPHENOUS VEIN GRAFT TO THE OBTUSE MARGINAL  VESSEL  . Transthoracic echocardiogram  12/01/2009    EF 60-65%  . Cardiovascular stress test  11/28/2009    EF 75%  . Aorta - bilateral femoral artery bypass graft  05/25/2011    Procedure: AORTA BIFEMORAL BYPASS GRAFT;  Surgeon: Mal Misty, MD;  Location: [facility] OR;  Service: Vascular;  Laterality: N/A;  . Pr vein bypass graft,aorto-fem-pop  05/25/11    History  Smoking status  . Former Smoker -- 20 years  . Types: Cigarettes  . Quit date: 07/10/1991  Smokeless tobacco  . Never Used    History  Alcohol Use No    Family History  Problem Relation Age of Onset  . Hypertension Mother   . Coronary artery disease Father   . Hypertension Sister   . Cirrhosis Brother   . Kidney disease Daughter     Review of Systems: The review of systems is per the HPI.  All other systems were reviewed and are negative.  Physical Exam: BP 150/76  Pulse 50  Ht 5\' 2"  (1.575 m)  Wt 229 lb 1.9 oz (103.928 kg)  BMI 41.9 kg/m2 Patient is very pleasant and in no acute distress. She is morbidly obese. Only down 3 pounds. Skin is warm and dry. Color is normal.  HEENT is unremarkable. Normocephalic/atraumatic. PERRL. Sclera are nonicteric. Neck is supple. No masses. No JVD. Lungs are clear. Cardiac exam shows a regular rate and rhythm. Heart rate is 56 by me.  Abdomen is soft. Extremities are with 3+ edema and is more in her thighs. Gait and ROM are intact. No gross neurologic deficits noted.  LABORATORY DATA: Lab Results  Component Value Date   WBC 5.5 09/20/2011   HGB 10.2* 09/20/2011   HCT 30.8* 09/20/2011   PLT 143* 09/20/2011   GLUCOSE 288* 09/20/2011   CHOL  Value: 265        ATP III CLASSIFICATION:  <200     mg/dL   Desirable  200-239  mg/dL   Borderline High  >=240    mg/dL   High* 03/19/2007   TRIG 304* 03/19/2007   HDL 48 03/19/2007   LDLCALC  Value: 156        Total Cholesterol/HDL:CHD Risk Coronary Heart Disease Risk Table  Men   Women  1/2 Average Risk   3.4   3.3*  03/19/2007   ALT 22 09/18/2011   AST 19 09/18/2011   NA 137 09/20/2011   K 3.9 09/20/2011   CL 105 09/20/2011   CREATININE 1.34* 09/20/2011   BUN 43* 09/20/2011   CO2 22 09/20/2011   TSH 0.699 09/18/2011   INR 1.20 05/28/2011   HGBA1C 6.1* 06/13/2011   Echo Study Conclusions  - Left ventricle: The cavity size was normal. Wall thickness was increased in a pattern of severe LVH. Systolic function was normal. The estimated ejection fraction was in the range of 60% to 65%. Wall motion was normal; there were no regional wall motion abnormalities. Doppler parameters are consistent with abnormal left ventricular relaxation (grade 1 diastolic dysfunction). Doppler parameters are consistent with high ventricular filling pressure. - Mitral valve: Calcified annulus. - Left atrium: The atrium was moderately dilated. - Pulmonary arteries: Systolic pressure was moderately increased. PA peak pressure: 30mm Hg (S). - Pericardium, extracardiac: A trivial pericardial effusion was identified.  Assessment / Plan:  1. CAD - prior CABG - no chest pain. Continue with medical management.   2. Chronic venous insufficiency with lower extremity edema - not really improved. I would continue the wraps/Unna boots. She is not able to put on thigh high support stockings. She has to cut back her salt and stop using cans. I have suggested frozen vegetables instead. Will give her 3 days of Zaroxolyn at 2.5 mg daily. Check baseline labs today. She is to call later this week with an update. EF was normal by echo. Does have diastolic dysfunction.   3. Bradycardia - on lower dose of Coreg - heart rate is ok. She is asymptomatic. I have left her on her current dose.   4. HTN - blood pressure is a little better. I have left her on her current regimen for now.   5. Moderate PHTN - not really symptomatic at this time.   Will see her back in about 2 weeks. Check labs today. I cautioned her about the brisk diuresis with the  Zaroxolyn.   Patient is agreeable to this plan and will call if any problems develop in the interim.

## 2012-09-15 NOTE — Patient Instructions (Addendum)
Stay on your current medicines  I am adding Zaroxolyn 2.5 mg daily for just 3 days only - take today, Tuesday and Wednesday and then stop  Call here on Thursday to talk with Malachy Mood and let her know what your weight is and how you responded to the medicine  We need to check labs today  Try to cut back on the salt - do not eat anything out of a can  We will see you back in 2 weeks on a day that Dr. Martinique is here  Call the [facility] office at [phone] if you have any questions, problems or concerns.

## 2012-09-18 ENCOUNTER — Telehealth: Payer: Self-pay | Admitting: Cardiology

## 2012-09-18 NOTE — Telephone Encounter (Signed)
Spoke with pt, she was given zaroxolyn for three days by [employer] gerhardt np and was to call back today with her progress. She reports her weight is down 3.5 to 4 lbs. She does report no change in the edema in her thighs but states her legs and feet are skinny. She denies sob. She is expecting the home health nurse to come out today to wrap her legs and place the una boots. She was to call and talk to cheryl, dr Doug Sou nurse. Pt made aware will forward to lori and cheryl but not to take the zaroxolyn today. We will get back to her today or tomorrow on what to do next. The pt agreed with this plan.

## 2012-09-18 NOTE — Telephone Encounter (Signed)
New Problem:    Patient called in because Lori placed her on a medication for three days and she wanted to give the results.  Would like a call back before 9:30am when her Home health nurse comes.

## 2012-09-19 NOTE — Telephone Encounter (Signed)
I would not want to give her anymore Zaroxolyn without seeing what her BMET looks like. Can we get this checked?

## 2012-09-22 ENCOUNTER — Telehealth: Payer: Self-pay | Admitting: Nurse Practitioner

## 2012-09-22 ENCOUNTER — Other Ambulatory Visit: Payer: Self-pay | Admitting: *Deleted

## 2012-09-22 DIAGNOSIS — Z79899 Other long term (current) drug therapy: Secondary | ICD-10-CM

## 2012-09-22 NOTE — Telephone Encounter (Signed)
This patient has been called by Elly Modena, RN earlier today. She noted that her weight was down. Her swelling seems to be improved. Would continue with her current regimen. See her back at her regular time.

## 2012-09-22 NOTE — Telephone Encounter (Signed)
Pt calling back re previous message, has a home nurse coming at 930a and needs to know what to tell her to do?

## 2012-09-22 NOTE — Telephone Encounter (Signed)
SPOKE WITH PT  RE  MESSAGE PER PT  HOME HEALTH NURSE STATED PT   HAS RALES BILATERAL TO LOWER LUNGS  ALSO NOTES  [employer] TO LEGS  PT AWARE WILL RECHECK BMET TOMORROW  PER LORI NEEDS  LABS RECHECKED  REMAINS ON LASIX 80 MG BID  NO ZAROXOLYN AT THIS TIME  WILL FORWARD TO LORI  FOR REVIEW./CY

## 2012-09-22 NOTE — Telephone Encounter (Signed)
New problem   Pt need to know if she need to continue taking Metolazone 2.5mg . She was told to take it M-W and pt called back to give how much weight she had loss. Please call pt concerning this problem

## 2012-09-22 NOTE — Telephone Encounter (Signed)
Will route to dr/nurse

## 2012-09-22 NOTE — Telephone Encounter (Signed)
See previous 09/22/12 note.Spoke to patient earlier today her weight is down.Advised to stop zaroxolyn and keep appointment with Truitt Merle NP 10/01/12.Advised to call sooner if weight increases.

## 2012-09-23 ENCOUNTER — Other Ambulatory Visit: Payer: Medicare Other

## 2012-10-01 ENCOUNTER — Encounter: Payer: Self-pay | Admitting: Nurse Practitioner

## 2012-10-01 ENCOUNTER — Ambulatory Visit (INDEPENDENT_AMBULATORY_CARE_PROVIDER_SITE_OTHER): Payer: Medicare Other | Admitting: Nurse Practitioner

## 2012-10-01 VITALS — BP 180/78 | HR 60 | Ht 62.0 in | Wt 219.1 lb

## 2012-10-01 DIAGNOSIS — I10 Essential (primary) hypertension: Secondary | ICD-10-CM

## 2012-10-01 LAB — BASIC METABOLIC PANEL
BUN: 37 mg/dL — ABNORMAL HIGH (ref 6–23)
CO2: 29 mEq/L (ref 19–32)
Calcium: 9.1 mg/dL (ref 8.4–10.5)
Chloride: 103 mEq/L (ref 96–112)
Creatinine, Ser: 1.4 mg/dL — ABNORMAL HIGH (ref 0.4–1.2)
GFR: 50.32 mL/min — ABNORMAL LOW (ref >60.00)
Glucose, Bld: 153 mg/dL — ABNORMAL HIGH (ref 70–99)
Potassium: 4.1 mEq/L (ref 3.5–5.1)
Sodium: 141 mEq/L (ref 135–145)

## 2012-10-01 LAB — CBC WITH DIFFERENTIAL/PLATELET
Basophils Absolute: 0 10*3/uL (ref 0.0–0.1)
Basophils Relative: 0.5 % (ref 0.0–3.0)
Eosinophils Absolute: 0.1 10*3/uL (ref 0.0–0.7)
Eosinophils Relative: 1.9 % (ref 0.0–5.0)
HCT: 37.5 % (ref 36.0–46.0)
Hemoglobin: 12.2 g/dL (ref 12.0–15.0)
Lymphocytes Relative: 29.2 % (ref 12.0–46.0)
Lymphs Abs: 1.7 10*3/uL (ref 0.7–4.0)
MCHC: 32.5 g/dL (ref 30.0–36.0)
MCV: 88.7 fl (ref 78.0–100.0)
Monocytes Absolute: 0.4 10*3/uL (ref 0.1–1.0)
Monocytes Relative: 7.7 % (ref 3.0–12.0)
Neutro Abs: 3.4 10*3/uL (ref 1.4–7.7)
Neutrophils Relative %: 60.7 % (ref 43.0–77.0)
Platelets: 168 10*3/uL (ref 150.0–400.0)
RBC: 4.22 Mil/uL (ref 3.87–5.11)
RDW: 15.2 % — ABNORMAL HIGH (ref 11.5–14.6)
WBC: 5.7 10*3/uL (ref 4.5–10.5)

## 2012-10-01 MED ORDER — SPIRONOLACTONE 25 MG PO TABS: 25.0000 mg | ORAL_TABLET | Freq: Every day | ORAL | Status: DC

## 2012-10-01 MED ORDER — FUROSEMIDE 80 MG PO TABS: 40.0000 mg | ORAL_TABLET | Freq: Two times a day (BID) | ORAL | Status: DC

## 2012-10-01 NOTE — Progress Notes (Signed)
Arley Gentry Fitz Date of Birth: [birth year] Medical Record [id number]  History of Present Illness: Ms. Cavanna is seen back today for a 2 week check. She is seen for Dr. Martinique. She has had complex vascular surgery in November of 2012 and was quite slow to recover and required a stay in the SNF. She had CABG back in June of 2011. Has chronic swelling of her legs from venous insufficiency. Saw Dr. Martinique 4 weeks ago. Weight was up. She had more edema. Had her legs wrapped and using Unna boots. He increased her Lasix and updated her echo. Her heart rate was lower and he reduced her Coreg to 12.5 mg BID and noted that HR in the 50's would be acceptable.   I saw her 2 weeks ago. Still with considerable swelling in her thighs. Echo was ok with normal EF and moderate PHTN and grade 1 diastolic dysfunction. I have gave her a short course of Zaroxolyn.   She comes back today. Quite happy. Has lost weight. Down 10 pounds. Less edema in her legs and thighs. Would like to switch back to compression stockings. Wants to cut back on her Lasix. No more Zaroxolyn being used - only did that for 3 days. No chest pain. Not short of breath. She is anxious to get "back to doing her thing". Not lightheaded or dizzy.  Current Outpatient Prescriptions on File Prior to Visit  Medication Sig Dispense Refill  . acetaminophen (TYLENOL) 325 MG tablet Take 325-650 mg by mouth every 6 (six) hours as needed. For pain      . aspirin 325 MG tablet Take 325 mg by mouth daily.        Marland Kitchen atorvastatin (LIPITOR) 20 MG tablet Take 20 mg by mouth daily.        . carvedilol (COREG) 12.5 MG tablet Take 1 tablet (12.5 mg total) by mouth 2 (two) times daily.  180 tablet  3  . Cholecalciferol (VITAMIN D3) 2000 UNITS TABS Take 2,000 Units by mouth 1 day or 1 dose.      . Coenzyme Q10 200 MG capsule Take 200 mg by mouth daily.        . ferrous sulfate 325 (65 FE) MG tablet Take 1 tablet (325 mg total) by mouth 2 (two) times daily with a meal.  Restart in 1 week  30 tablet  0  . fish oil-omega-3 fatty acids 1000 MG capsule Take 1 g by mouth 2 (two) times daily.        . furosemide (LASIX) 80 MG tablet Take 1 tablet (80 mg total) by mouth 2 (two) times daily.  60 tablet  6  . insulin glargine (LANTUS) 100 UNIT/ML injection Inject 15 Units into the skin 2 (two) times daily.       . Liraglutide (VICTOZA [place]) Inject 1.8 Units into the skin.      Marland Kitchen losartan-hydrochlorothiazide (HYZAAR) 100-12.5 MG per tablet Take 1 tablet by mouth daily.      . magnesium aspartate (MAGINEX) 615 MG tablet Take 500 mg by mouth 2 (two) times daily.       . metolazone (ZAROXOLYN) 2.5 MG tablet Take 1 tablet (2.5 mg total) by mouth daily.  15 tablet  3  . Probiotic Product (PROBIOTIC DAILY PO) Take by mouth as needed.       No current facility-administered medications on file prior to visit.    Allergies  Allergen Reactions  . Iohexol      Code: RASH, Desc: pt  called 1 day post scanning stating that skin was red and "itching all over" some what better but still had symptom.. instructed pt to take benadryl to relieve symptoms,per dr Alvester Chou.if any problems call back/mms, Onset Date: [id number]   . Sulfa Antibiotics     Past Medical History  Diagnosis Date  . Claudication   . Diabetes mellitus   . Hypertension   . Hyperlipidemia   . DJD (degenerative joint disease)   . PAD (peripheral artery disease)   . Coronary artery disease   . Leg pain   . Carotid artery occlusion   . Obesity   . DDD (degenerative disc disease)   . DJD (degenerative joint disease)   . Shortness of breath     exertion  . Anxiety   . Diabetic coma   . Edema     Past Surgical History  Procedure Laterality Date  . Cardiac catheterization  12/01/2009    EF 65%  . Removal of fibrous cyst from right breast  10+ YEARS  . Retinal detachment surgery  10+ YEARS    LEFT EYE  . Coronary artery bypass graft  12/08/2009    LIMA GRAFT TO THE DISTAL LAD AND SAPHENOUS VEIN GRAFT TO THE  OBTUSE MARGINAL VESSEL  . Transthoracic echocardiogram  12/01/2009    EF 60-65%  . Cardiovascular stress test  11/28/2009    EF 75%  . Aorta - bilateral femoral artery bypass graft  05/25/2011    Procedure: AORTA BIFEMORAL BYPASS GRAFT;  Surgeon: Mal Misty, MD;  Location: [facility] OR;  Service: Vascular;  Laterality: N/A;  . Pr vein bypass graft,aorto-fem-pop  05/25/11    History  Smoking status  . Former Smoker -- 20 years  . Types: Cigarettes  . Quit date: 07/10/1991  Smokeless tobacco  . Never Used    History  Alcohol Use No    Family History  Problem Relation Age of Onset  . Hypertension Mother   . Coronary artery disease Father   . Hypertension Sister   . Cirrhosis Brother   . Kidney disease Daughter     Review of Systems: The review of systems is per the HPI.  All other systems were reviewed and are negative.  Physical Exam: BP 180/78  Pulse 60  Ht 5\' 2"  (1.575 m)  Wt 219 lb 1.9 oz (99.392 kg)  BMI 40.07 kg/m2 Patient is very pleasant and in no acute distress. She is obese. Weight is down 10 pounds. Skin is warm and dry. Color is normal.  HEENT is unremarkable. Normocephalic/atraumatic. PERRL. Sclera are nonicteric. Neck is supple. No masses. No JVD. Lungs are clear. Cardiac exam shows a regular rate and rhythm. Abdomen is soft. Extremities are wrapped but clearly with less edema. Gait and ROM are intact. She is using a cane. No gross neurologic deficits noted.  LABORATORY DATA: Lab Results  Component Value Date   WBC 5.5 09/20/2011   HGB 10.2* 09/20/2011   HCT 30.8* 09/20/2011   PLT 143* 09/20/2011   GLUCOSE 273* 09/15/2012   CHOL  Value: 265        ATP III CLASSIFICATION:  <200     mg/dL   Desirable  200-239  mg/dL   Borderline High  >=240    mg/dL   High* 03/19/2007   TRIG 304* 03/19/2007   HDL 48 03/19/2007   LDLCALC  Value: 156        Total Cholesterol/HDL:CHD Risk Coronary Heart Disease Risk Table  Men   Women  1/2 Average Risk   3.4   3.3*  03/19/2007   ALT 22 09/18/2011   AST 19 09/18/2011   NA 143 09/15/2012   K 3.6 09/15/2012   CL 108 09/15/2012   CREATININE 1.3* 09/15/2012   BUN 30* 09/15/2012   CO2 28 09/15/2012   TSH 0.699 09/18/2011   INR 1.20 05/28/2011   HGBA1C 6.1* 06/13/2011     Assessment / Plan: 1. Volume overload - she is much better. Weight is down 10 pounds. Will cut the Lasix back to 40 mg BID. Continue with salt restriction. Ok to switch back to compression stockings.   2. HTN - repeat BP by me is still a little high at 150/80. Heart rate has limited her use of Coreg. I have added Aldactone 25 mg daily. Check labs today and BMET again in one week. Will need to follow her potassium and kidney function closely. I will see her back in 3 weeks. Have asked her to try and check some readings at home.   3. Anemia - recheck today.   Overall, she does look much better. Encouraged her to be as active as possible. See her back in 3 weeks.  Patient is agreeable to this plan and will call if any problems develop in the interim.   Burtis Junes, RN, ANP-C [facility] [address] [place], [place]  [address]

## 2012-10-01 NOTE — Patient Instructions (Addendum)
Reduce the Lasix to 40 mg two times a day  No more Zaroxyolyn but do not throw the bottle away - just put away for now  Start Aldactone 25 mg once a day in the morning  Continue to restrict your salt  Monitor your blood pressure at home if possible - keep a record for me  Ok to switch to compression stockings now  We need to check lab today and again in a week (BMET)  I will see you in 3 weeks  Call the [place] office at [phone] if you have any questions, problems or concerns.

## 2012-10-08 ENCOUNTER — Other Ambulatory Visit (INDEPENDENT_AMBULATORY_CARE_PROVIDER_SITE_OTHER): Payer: Medicare Other

## 2012-10-08 DIAGNOSIS — I10 Essential (primary) hypertension: Secondary | ICD-10-CM

## 2012-10-08 LAB — BASIC METABOLIC PANEL
BUN: 31 mg/dL — ABNORMAL HIGH (ref 6–23)
CO2: 26 mEq/L (ref 19–32)
Calcium: 8.5 mg/dL (ref 8.4–10.5)
Chloride: 106 mEq/L (ref 96–112)
Creatinine, Ser: 1.3 mg/dL — ABNORMAL HIGH (ref 0.4–1.2)
GFR: 52.1 mL/min — ABNORMAL LOW (ref >60.00)
Glucose, Bld: 177 mg/dL — ABNORMAL HIGH (ref 70–99)
Potassium: 4 mEq/L (ref 3.5–5.1)
Sodium: 141 mEq/L (ref 135–145)

## 2012-10-22 ENCOUNTER — Encounter: Payer: Self-pay | Admitting: Nurse Practitioner

## 2012-10-22 ENCOUNTER — Ambulatory Visit (INDEPENDENT_AMBULATORY_CARE_PROVIDER_SITE_OTHER): Payer: Medicare Other | Admitting: Nurse Practitioner

## 2012-10-22 VITALS — BP 170/68 | HR 56 | Ht 62.0 in | Wt 224.8 lb

## 2012-10-22 DIAGNOSIS — I10 Essential (primary) hypertension: Secondary | ICD-10-CM

## 2012-10-22 LAB — BASIC METABOLIC PANEL
BUN: 29 mg/dL — ABNORMAL HIGH (ref 6–23)
CO2: 25 mEq/L (ref 19–32)
Calcium: 8.7 mg/dL (ref 8.4–10.5)
Chloride: 107 mEq/L (ref 96–112)
Creatinine, Ser: 1.3 mg/dL — ABNORMAL HIGH (ref 0.4–1.2)
GFR: 52.55 mL/min — ABNORMAL LOW (ref >60.00)
Glucose, Bld: 208 mg/dL — ABNORMAL HIGH (ref 70–99)
Potassium: 4.3 mEq/L (ref 3.5–5.1)
Sodium: 141 mEq/L (ref 135–145)

## 2012-10-22 MED ORDER — HYDRALAZINE HCL 25 MG PO TABS: 25.0000 mg | ORAL_TABLET | Freq: Three times a day (TID) | ORAL | Status: DC

## 2012-10-22 NOTE — Patient Instructions (Addendum)
We are checking lab today  I am adding Hydralazine 25 mg to take three times a day  Keep a check on your blood pressure  I will see you in a month  Call the [place] office at [phone] if you have any questions, problems or concerns.

## 2012-10-22 NOTE — Progress Notes (Signed)
Norma [name] Date of Birth: 09/03/1945 Medical Record [id number]  History of Present Illness: Norma [name] is seen back today for a 3 week check. She is seen for Dr. Martinique. She has had complex vascular surgery in November of 2012 and was quite slow to recover and required a stay in the SNF. She had CABG back in June of 2011. Has chronic swelling of her legs from venous insufficiency. She has recently had more issues with edema. Had her legs were wrapped and she was placed in Unna boots. Lasix was increased and her echo was updated - this showed a normal EF and moderate PHTN and grade 1 diastolic dysfunction. She required a short course of Zaroxolyn.  Her heart rate was lower and she remains on a lower dose of her Coreg to 12.5 mg BID. It has been noted that a HR in the 50's would be acceptable. Has had some mild renal insufficiency which was improving on last check.   When I saw her 3 weeks ago - she was much better. We switched back to compression stockings. Cut her Lasix back and added low dose Aldactone.   She comes back in today. She is here alone. She is doing well. Feels good. Has gained 5 pounds. Some swelling in her legs but it is an acceptable amount. Still using her support stockings. Has a plan to start walking next week. Back teaching and preaching. No chest pain. Not short of breath. BP is still pretty high at home. Was previously on Norvasc - but I do not think we can use that again. Was on Hydralazine in the past and does not remember having an issue.    Current Outpatient Prescriptions on File Prior to Visit  Medication Sig Dispense Refill  . acetaminophen (TYLENOL) 325 MG tablet Take 325-650 mg by mouth every 6 (six) hours as needed. For pain      . aspirin 325 MG tablet Take 325 mg by mouth daily.        Marland Kitchen atorvastatin (LIPITOR) 20 MG tablet Take 20 mg by mouth daily.        . carvedilol (COREG) 12.5 MG tablet Take 1 tablet (12.5 mg total) by mouth 2 (two) times daily.  180 tablet   3  . Cholecalciferol (VITAMIN D3) 2000 UNITS TABS Take 2,000 Units by mouth 1 day or 1 dose.      . Coenzyme Q10 200 MG capsule Take 200 mg by mouth daily.        . ferrous sulfate 325 (65 FE) MG tablet Take 1 tablet (325 mg total) by mouth 2 (two) times daily with a meal. Restart in 1 week  30 tablet  0  . fish oil-omega-3 fatty acids 1000 MG capsule Take 1 g by mouth 2 (two) times daily.        . furosemide (LASIX) 80 MG tablet Take 0.5 tablets (40 mg total) by mouth 2 (two) times daily.  60 tablet  6  . insulin glargine (LANTUS) 100 UNIT/ML injection Inject 15 Units into the skin 2 (two) times daily.       . Liraglutide (VICTOZA [place]) Inject 1.8 Units into the skin.      Marland Kitchen losartan-hydrochlorothiazide (HYZAAR) 100-12.5 MG per tablet Take 1 tablet by mouth daily.      . magnesium aspartate (MAGINEX) 615 MG tablet Take 500 mg by mouth 2 (two) times daily.       . Probiotic Product (PROBIOTIC DAILY PO) Take by mouth as needed.      Marland Kitchen  spironolactone (ALDACTONE) 25 MG tablet Take 1 tablet (25 mg total) by mouth daily.  30 tablet  6   No current facility-administered medications on file prior to visit.    Allergies  Allergen Reactions  . Iohexol      Code: RASH, Desc: pt called 1 day post scanning stating that skin was red and "itching all over" some what better but still had symptom.. instructed pt to take benadryl to relieve symptoms,per dr Alvester Chou.if any problems call back/mms, Onset Date: [id number]   . Sulfa Antibiotics     Past Medical History  Diagnosis Date  . Claudication   . Diabetes mellitus   . Hypertension   . Hyperlipidemia   . DJD (degenerative joint disease)   . PAD (peripheral artery disease)   . Coronary artery disease   . Leg pain   . Carotid artery occlusion   . Obesity   . DDD (degenerative disc disease)   . DJD (degenerative joint disease)   . Shortness of breath     exertion  . Anxiety   . Diabetic coma   . Edema     Past Surgical History  Procedure  Laterality Date  . Cardiac catheterization  12/01/2009    EF 65%  . Removal of fibrous cyst from right breast  10+ YEARS  . Retinal detachment surgery  10+ YEARS    LEFT EYE  . Coronary artery bypass graft  12/08/2009    LIMA GRAFT TO THE DISTAL LAD AND SAPHENOUS VEIN GRAFT TO THE OBTUSE MARGINAL VESSEL  . Transthoracic echocardiogram  12/01/2009    EF 60-65%  . Cardiovascular stress test  11/28/2009    EF 75%  . Aorta - bilateral femoral artery bypass graft  05/25/2011    Procedure: AORTA BIFEMORAL BYPASS GRAFT;  Surgeon: Mal Misty, MD;  Location: [facility] OR;  Service: Vascular;  Laterality: N/A;  . Pr vein bypass graft,aorto-fem-pop  05/25/11    History  Smoking status  . Former Smoker -- 20 years  . Types: Cigarettes  . Quit date: 07/10/1991  Smokeless tobacco  . Never Used    History  Alcohol Use No    Family History  Problem Relation Age of Onset  . Hypertension Mother   . Coronary artery disease Father   . Hypertension Sister   . Cirrhosis Brother   . Kidney disease Daughter     Review of Systems: The review of systems is per the HPI.  All other systems were reviewed and are negative.  Physical Exam: BP 170/68  Pulse 56  Ht 5\' 2"  (1.575 m)  Wt 224 lb 12.8 oz (101.969 kg)  BMI 41.11 kg/m2 Weight is up 5 pounds. BP by me is 180/70. Patient is very pleasant and in no acute distress. Skin is warm and dry. Color is normal.  HEENT is unremarkable. Normocephalic/atraumatic. PERRL. Sclera are nonicteric. Neck is supple. No masses. No JVD. Lungs are clear. Cardiac exam shows a regular rate and rhythm. Abdomen is soft. Extremities are with 1+ edema. Gait and ROM are intact. No gross neurologic deficits noted.  LABORATORY DATA: BMET is pending for today   Lab Results  Component Value Date   WBC 5.7 10/01/2012   HGB 12.2 10/01/2012   HCT 37.5 10/01/2012   PLT 168.0 10/01/2012   GLUCOSE 177* 10/08/2012   CHOL  Value: 265        ATP III CLASSIFICATION:  <200     mg/dL    Desirable  200-239  mg/dL  Borderline High  >=240    mg/dL   High* 03/19/2007   TRIG 304* 03/19/2007   HDL 48 03/19/2007   LDLCALC  Value: 156        Total Cholesterol/HDL:CHD Risk Coronary Heart Disease Risk Table                     Men   Women  1/2 Average Risk   3.4   3.3* 03/19/2007   ALT 22 09/18/2011   AST 19 09/18/2011   NA 141 10/08/2012   K 4.0 10/08/2012   CL 106 10/08/2012   CREATININE 1.3* 10/08/2012   BUN 31* 10/08/2012   CO2 26 10/08/2012   TSH 0.699 09/18/2011   INR 1.20 05/28/2011   HGBA1C 6.1* 06/13/2011    Assessment / Plan: 1. Volume overload - Continue with salt restriction, support stockings and current regimen of diuretics.   2. HTN - not controlled. She is agreeable to adding back Hydralazine 25 mg TID. Will avoid Norvasc due to her issues with edema.   3. Anemia - last recheck showed resolution with normal counts.   I will see her back in a month. Check BMET today. She is to continue to monitor her readings at home.   Patient is agreeable to this plan and will call if any problems develop in the interim.   Burtis Junes, RN, ANP-C  [facility]  [address] [place]  [place], [place] [address]

## 2012-11-04 ENCOUNTER — Encounter: Payer: Self-pay | Admitting: Cardiology

## 2012-11-12 ENCOUNTER — Encounter: Payer: Self-pay | Admitting: Nurse Practitioner

## 2012-11-21 ENCOUNTER — Ambulatory Visit: Payer: Medicare Other | Admitting: Nurse Practitioner

## 2013-01-20 ENCOUNTER — Ambulatory Visit (INDEPENDENT_AMBULATORY_CARE_PROVIDER_SITE_OTHER): Payer: Medicare Other | Admitting: Nurse Practitioner

## 2013-01-20 ENCOUNTER — Encounter: Payer: Self-pay | Admitting: Nurse Practitioner

## 2013-01-20 VITALS — BP 160/74 | HR 56 | Ht 62.0 in | Wt 227.1 lb

## 2013-01-20 DIAGNOSIS — I2510 Atherosclerotic heart disease of native coronary artery without angina pectoris: Secondary | ICD-10-CM

## 2013-01-20 DIAGNOSIS — I10 Essential (primary) hypertension: Secondary | ICD-10-CM

## 2013-01-20 MED ORDER — LOSARTAN POTASSIUM-HCTZ 100-25 MG PO TABS: 1.0000 | ORAL_TABLET | Freq: Every day | ORAL | Status: DC

## 2013-01-20 NOTE — Patient Instructions (Addendum)
I am increasing your Losartan HCT to 100/25 daily  Stay on your other medicines  I will see you in 3 weeks with lab to check your potassium level  I will send Dr. Karlton Lemon a note today - please have your labs checked on Friday with her as planned  Monitor your blood pressure at home  Call the [facility] office at [phone] if you have any questions, problems or concerns.

## 2013-01-20 NOTE — Progress Notes (Signed)
Norma [name] Date of Birth: Nov 02, 1945 Medical Record [id number]  History of Present Illness: Norma [name] is seen back today for a 3 month check. She is seen for Dr. Martinique. She has had complex vascular surgery in November of 2012 and was quite slow to recover and required a stay in the SNF. She had CABG back in June of 2011. Has chronic swelling of her legs from venous insufficiency. She has recently had more issues with edema. Had her legs were wrapped and she was placed in Unna boots. Lasix was increased and her echo was updated - this showed a normal EF and moderate PHTN and grade 1 diastolic dysfunction. She required a short course of Zaroxolyn. Her heart rate was lower and she remains on a lower dose of her Coreg to 12.5 mg BID. It has been noted that a HR in the 50's would be acceptable. Has had some mild renal insufficiency which was improving on last check.   Last seen in April of 2014 and seemed to be doing ok.   Comes back today. Here alone. Notes that her BP is trending up. Averaging 0000000 to [id number] systolic at home. She otherwise feels good. No chest pain. Not short of breath. Limited by back pain and has a visit with Dr. Ace Gins to discuss. Sees her PCP later this week with labs. Her swelling is under control - only using the Lasix about twice a week. Does have some transient dizziness after taking her medicines but notes that BP is still in the 160 range at that time.   Current Outpatient Prescriptions  Medication Sig Dispense Refill  . acetaminophen (TYLENOL) 325 MG tablet Take 325-650 mg by mouth every 6 (six) hours as needed. For pain      . aspirin 325 MG tablet Take 325 mg by mouth daily.        Marland Kitchen atorvastatin (LIPITOR) 20 MG tablet Take 20 mg by mouth daily.        . carvedilol (COREG) 12.5 MG tablet Take 1 tablet (12.5 mg total) by mouth 2 (two) times daily.  180 tablet  3  . Cholecalciferol (VITAMIN D3) 2000 UNITS TABS Take 2,000 Units by mouth 1 day or 1 dose.      . Coenzyme Q10  200 MG capsule Take 200 mg by mouth daily.        . fish oil-omega-3 fatty acids 1000 MG capsule Take 1 g by mouth 2 (two) times daily.        . furosemide (LASIX) 40 MG tablet Take 40 mg by mouth as needed. Take as needed for feet swellling      . hydrALAZINE (APRESOLINE) 25 MG tablet Take 1 tablet (25 mg total) by mouth 3 (three) times daily.  90 tablet  3  . insulin glargine (LANTUS) 100 UNIT/ML injection Inject 15 Units into the skin 2 (two) times daily.       . Liraglutide (VICTOZA [place]) Inject 1.8 Units into the skin.      Marland Kitchen losartan-hydrochlorothiazide (HYZAAR) 100-12.5 MG per tablet Take 1 tablet by mouth daily.      . magnesium aspartate (MAGINEX) 615 MG tablet Take 500 mg by mouth 2 (two) times daily.       . Probiotic Product (PROBIOTIC DAILY PO) Take by mouth as needed.      Marland Kitchen spironolactone (ALDACTONE) 25 MG tablet Take 1 tablet (25 mg total) by mouth daily.  30 tablet  6  . ferrous sulfate 325 (65 FE) MG  tablet Take 1 tablet (325 mg total) by mouth 2 (two) times daily with a meal. Restart in 1 week  30 tablet  0   No current facility-administered medications for this visit.    Allergies  Allergen Reactions  . Iohexol      Code: RASH, Desc: pt called 1 day post scanning stating that skin was red and "itching all over" some what better but still had symptom.. instructed pt to take benadryl to relieve symptoms,per dr Alvester Chou.if any problems call back/mms, Onset Date: [id number]   . Sulfa Antibiotics     Past Medical History  Diagnosis Date  . Claudication   . Diabetes mellitus   . Hypertension   . Hyperlipidemia   . DJD (degenerative joint disease)   . PAD (peripheral artery disease)   . Coronary artery disease   . Leg pain   . Carotid artery occlusion   . Obesity   . DDD (degenerative disc disease)   . DJD (degenerative joint disease)   . Shortness of breath     exertion  . Anxiety   . Diabetic coma   . Edema     Past Surgical History  Procedure Laterality Date  .  Cardiac catheterization  12/01/2009    EF 65%  . Removal of fibrous cyst from right breast  10+ YEARS  . Retinal detachment surgery  10+ YEARS    LEFT EYE  . Coronary artery bypass graft  12/08/2009    LIMA GRAFT TO THE DISTAL LAD AND SAPHENOUS VEIN GRAFT TO THE OBTUSE MARGINAL VESSEL  . Transthoracic echocardiogram  12/01/2009    EF 60-65%  . Cardiovascular stress test  11/28/2009    EF 75%  . Aorta - bilateral femoral artery bypass graft  05/25/2011    Procedure: AORTA BIFEMORAL BYPASS GRAFT;  Surgeon: Mal Misty, MD;  Location: [facility] OR;  Service: Vascular;  Laterality: N/A;  . Pr vein bypass graft,aorto-fem-pop  05/25/11    History  Smoking status  . Former Smoker -- 20 years  . Types: Cigarettes  . Quit date: 07/10/1991  Smokeless tobacco  . Never Used    History  Alcohol Use No    Family History  Problem Relation Age of Onset  . Hypertension Mother   . Coronary artery disease Father   . Hypertension Sister   . Cirrhosis Brother   . Kidney disease Daughter     Review of Systems: The review of systems is per the HPI.  All other systems were reviewed and are negative.  Physical Exam: BP 160/74  Pulse 56  Ht 5\' 2"  (1.575 m)  Wt 227 lb 1.9 oz (103.021 kg)  BMI 41.53 kg/m2 Weight is up 3 pounds since last visit.  Patient is very pleasant and in no acute distress. She is obese. Skin is warm and dry. Color is normal.  HEENT is unremarkable. Normocephalic/atraumatic. PERRL. Sclera are nonicteric. Neck is supple. No masses. No JVD. Lungs are clear. Cardiac exam shows a regular rate and rhythm. Soft outflow murmur noted. Abdomen is soft. Extremities are with 1+ ankle edema. Gait and ROM are intact. No gross neurologic deficits noted.  LABORATORY DATA:  Lab Results  Component Value Date   WBC 5.7 10/01/2012   HGB 12.2 10/01/2012   HCT 37.5 10/01/2012   PLT 168.0 10/01/2012   GLUCOSE 208* 10/22/2012   CHOL  Value: 265        ATP III CLASSIFICATION:  <200     mg/dL  Desirable  200-239  mg/dL   Borderline High  >=240    mg/dL   High* 03/19/2007   TRIG 304* 03/19/2007   HDL 48 03/19/2007   LDLCALC  Value: 156        Total Cholesterol/HDL:CHD Risk Coronary Heart Disease Risk Table                     Men   Women  1/2 Average Risk   3.4   3.3* 03/19/2007   ALT 22 09/18/2011   AST 19 09/18/2011   NA 141 10/22/2012   K 4.3 10/22/2012   CL 107 10/22/2012   CREATININE 1.3* 10/22/2012   BUN 29* 10/22/2012   CO2 25 10/22/2012   TSH 0.699 09/18/2011   INR 1.20 05/28/2011   HGBA1C 6.1* 06/13/2011    Assessment / Plan: 1. HTN - BP by me is 160/70. I am increasing her Hyzaar to 100/25. She has labs with her PCP later this week. I will see her back in 3 weeks with a BMET. She will continue to monitor her BP at home. Would increase her Hydralazine as the next step if needed.   2. Volume overload - currently stable with her current regimen.   3. CAD - with past CABG - no chest pain reported.   4. PVD - with past vascular surgery.  Patient is agreeable to this plan and will call if any problems develop in the interim.   Burtis Junes, RN, ANP-C [facility] [address] [place], [place]  [address]

## 2013-01-21 ENCOUNTER — Telehealth: Payer: Self-pay | Admitting: Nurse Practitioner

## 2013-01-21 NOTE — Telephone Encounter (Signed)
S/w pt several times today about the increase of losartan-hctz from 12.5 to 25 mg. Pt stated was dizzy and had some lightheadness but complain of that yesterday during ov before the increased dose. I called pt back pt checked sugar at 137 and bp at 176/62, Cecille Rubin was concerned bp was to low on increased dose but that was not the case. Pt also stated forgot to take spironolactone and just took it. I told pt I will forward this to Cecille Rubin and I will call pt back on 01/23/13 at 1:30 and pt will have her bp checked and sugar checked prior to phone call I advised pt to keep taking meds till phone call on friday

## 2013-01-21 NOTE — Telephone Encounter (Signed)
New problem   Pt returning your call about a medication

## 2013-01-22 ENCOUNTER — Telehealth: Payer: Self-pay | Admitting: Nurse Practitioner

## 2013-01-22 ENCOUNTER — Telehealth: Payer: Self-pay

## 2013-01-22 NOTE — Telephone Encounter (Signed)
Left message for call back.

## 2013-01-22 NOTE — Telephone Encounter (Signed)
Pt called wanting to speak with RN concering her med

## 2013-01-22 NOTE — Telephone Encounter (Signed)
Spoke with patient's daughter who states the patient is not available.  She asked if I had a message to relay and I advised that I do not know what the patient's question is but I did advise patient's daughter of the advice that Juanda Crumble, [place] for [facility] Merle, NP relayed to the patient on 7/16.  Patient's daughter states she will have patient call us back if she has any further questions or concerns.

## 2013-01-22 NOTE — Telephone Encounter (Signed)
Patient called back. She states that when she went home she looked at her medication bottle and realized that she was already taking Hyzaar 100/25mg  every day instead of her intake medication list stating Hyzaar 100/12.5mg  so she could not increase her dose. Her BP today was 148/58. She has an appointment tomorrow with her PCP at 1145. Advised I will pass message to Truitt Merle and she will follow up with her tomorrow.

## 2013-01-22 NOTE — Telephone Encounter (Signed)
New Prob  Pt would like to speak with someone regarding her medication. She said she had been speaking to [place] but she is not available today.

## 2013-01-23 ENCOUNTER — Telehealth: Payer: Self-pay | Admitting: *Deleted

## 2013-01-23 ENCOUNTER — Telehealth: Payer: Self-pay | Admitting: Nurse Practitioner

## 2013-01-23 NOTE — Telephone Encounter (Signed)
S/w pt will keep medications as they are pt already taking losartan-hctz 100-25 mg, monitor bp closely and if consistently stays above 160 call our office to let us  know, pt verbally understood

## 2013-01-23 NOTE — Telephone Encounter (Signed)
Pt called to let Truitt Merle NP know that she was seen by per PCP this morning. Pt's BP systolic was 0000000. PCP increased the  Hydralazine 25 mg to  50 mg tid. Pt states her BP this afternoon is 136/50.

## 2013-01-23 NOTE — Telephone Encounter (Signed)
Lets just leave her medicines as they are and have her monitor her BP more closely.

## 2013-01-23 NOTE — Telephone Encounter (Signed)
New Prob  Pt BP at PCP top number wass [phone]. She said that the doctor has increased her medication.

## 2013-01-25 NOTE — Telephone Encounter (Signed)
Ok. Have her continue to monitor. Will see back as scheduled.

## 2013-01-26 ENCOUNTER — Telehealth: Payer: Self-pay | Admitting: *Deleted

## 2013-01-26 NOTE — Telephone Encounter (Signed)
Will forward on to Dr. Martinique. Should be ok to come off her aspirin.  Would continue to monitor her BP and stay on her current regimen.

## 2013-01-26 NOTE — Telephone Encounter (Signed)
S/w pt to let pt know that Dr. Neita Garnet stated it would be ok to come off of asa for epidural pt stated needed something faxed over to Dr. Neomia Dear office fax # [phone] and office # is [phone] I will get Dr. Neita Garnet to write a script tomorrow and fax it over to Dr. Grace Isaac office

## 2013-01-26 NOTE — Telephone Encounter (Signed)
01-26-13 pt rtn call to [facility], pls call [phone]

## 2013-01-26 NOTE — Telephone Encounter (Signed)
Called patient back. She saw Dr.Bethea at [facility] today. BP was 160/50. She states that she needs to have an epidural for low back pain and has to come off Aspirin. Note will be coming from neuro to Truitt Merle NP concerning this and the patient wanted her to be aware. Will pass message to Truitt Merle NP.

## 2013-01-26 NOTE — Telephone Encounter (Signed)
Left message on machine per Truitt Merle to leave meds the same and track BP's and call us with BP readings.

## 2013-01-26 NOTE — Telephone Encounter (Signed)
Agree ok to come off ASA for epidural.  Norma Ehresman Martinique MD, [facility]

## 2013-01-27 NOTE — Telephone Encounter (Signed)
Patient called Dr.Jordan advised ok to come off aspirin for epidural.Received a form from [place] signed and form faxed back to Dr.Bethea's office.

## 2013-01-29 ENCOUNTER — Ambulatory Visit
Admission: RE | Admit: 2013-01-29 | Discharge: 2013-01-29 | Disposition: A | Discharge status: Home / Self Care | Payer: Medicare Other | Source: Ambulatory Visit | Attending: Physical Medicine and Rehabilitation | Admitting: Physical Medicine and Rehabilitation

## 2013-01-29 ENCOUNTER — Other Ambulatory Visit: Payer: Self-pay | Admitting: Physical Medicine and Rehabilitation

## 2013-01-29 DIAGNOSIS — G894 Chronic pain syndrome: Secondary | ICD-10-CM

## 2013-01-29 DIAGNOSIS — M545 Low back pain: Secondary | ICD-10-CM

## 2013-01-29 DIAGNOSIS — M48061 Spinal stenosis, lumbar region without neurogenic claudication: Secondary | ICD-10-CM

## 2013-02-02 ENCOUNTER — Telehealth: Payer: Self-pay | Admitting: *Deleted

## 2013-02-02 ENCOUNTER — Telehealth: Payer: Self-pay | Admitting: Nurse Practitioner

## 2013-02-02 NOTE — Telephone Encounter (Signed)
Spoke with patient who states BP continues to be [id number] systolic.  Patient reports readings:  7/28 @ 0315 173/56, HR 58; 7/28 @ 0619 175/56, HR 59; 7/26 @ 1928 171/55, HR 68; 7/25 @ 0407 155/51, HR 46 and @ 2023 166/48. Patient is concerned that systolic BP is too high and wants to let Truitt Merle, NP know her readings.  Patient reports that these readings make her anxious.  I advised patient that the diastolic number is low and discussed management of anxiety with her.  Patient reports that she takes all of her medications as directed and I reviewed the list with her.  Patient also wants Cecille Rubin to be aware that she is having ESI tomorrow and that she has not taken ASA since Friday in preparation for that procedure.  I advised patient that I will send message to Truitt Merle, NP for advice and will call her back with Lori's recommendations.

## 2013-02-02 NOTE — Telephone Encounter (Signed)
New Prob  Pt states that her BP is high, she did not give any readings. She would also like to speak to a nurse regarding her medication.

## 2013-02-02 NOTE — Telephone Encounter (Signed)
Verify her dose of Hydralazine - if on 25 mg TID - increase to 50 mg TID

## 2013-02-02 NOTE — Telephone Encounter (Signed)
S/w pt again and verified pt is taking hydralazine (50 mg ) tid we will increase dose to ( 75) mg tid pt verbally understood

## 2013-02-02 NOTE — Telephone Encounter (Signed)
S/w pt aware to increase Hydralazine to (50 mg ) tid pt understood verbally

## 2013-02-09 ENCOUNTER — Encounter: Payer: Self-pay | Admitting: Nurse Practitioner

## 2013-02-09 ENCOUNTER — Ambulatory Visit (INDEPENDENT_AMBULATORY_CARE_PROVIDER_SITE_OTHER): Payer: Medicare Other | Admitting: Nurse Practitioner

## 2013-02-09 VITALS — BP 150/48 | HR 65 | Ht 62.0 in | Wt 230.4 lb

## 2013-02-09 DIAGNOSIS — I10 Essential (primary) hypertension: Secondary | ICD-10-CM

## 2013-02-09 LAB — BASIC METABOLIC PANEL
BUN: 32 mg/dL — ABNORMAL HIGH (ref 6–23)
CO2: 25 mEq/L (ref 19–32)
Calcium: 8.8 mg/dL (ref 8.4–10.5)
Chloride: 107 mEq/L (ref 96–112)
Creatinine, Ser: 1.5 mg/dL — ABNORMAL HIGH (ref 0.4–1.2)
GFR: 45.92 mL/min — ABNORMAL LOW (ref >60.00)
Glucose, Bld: 303 mg/dL — ABNORMAL HIGH (ref 70–99)
Potassium: 5.2 mEq/L — ABNORMAL HIGH (ref 3.5–5.1)
Sodium: 139 mEq/L (ref 135–145)

## 2013-02-09 MED ORDER — HYDRALAZINE HCL 100 MG PO TABS: 100.0000 mg | ORAL_TABLET | Freq: Three times a day (TID) | ORAL | Status: DC

## 2013-02-09 NOTE — Progress Notes (Signed)
Norma [name] Date of Birth: [birth year] Medical Record [id number]  History of Present Illness: Norma [name] comes back today for a 3 week check. Seen for Dr. Martinique. She has had complex vascular surgery in November of 2012 and was quite slow to recover and required a stay in the SNF. She had CABG back in June of 2011. Has had chronic swelling of her legs from venous insufficiency. Her swelling resulted in having to have her legs wrapped and even placed in Unna boots. Echo was updated - this showed a normal EF and moderate PHTN and grade 1 diastolic dysfunction.. She has had some asymptomatic bradycardia - she remains on a lower dose of her Coreg to 12.5 mg BID. It has been noted that a HR in the 50's would be acceptable. Has had some mild renal insufficiency which was improving on last check.   Seen back 3 weeks ago and she noted that her BP was averaging 0000000 to [id number] systolic at home. We adjusted her medicines. Have had numerous phone calls regarding her BP readings. Now on higher doses of Hydralazine.  Comes back today. Here alone. Doing ok. Her readings at home are still quite high - up to [id number] systolic - her cuff (even though it is a wrist unit) does actually correlate nicely. She is trying to restrict her salt. No chest pain. Feels good but worried about her BP readings. Feels ok on her medicines.   Current Outpatient Prescriptions  Medication Sig Dispense Refill  . acetaminophen (TYLENOL) 325 MG tablet Take 325-650 mg by mouth every 6 (six) hours as needed. For pain      . aspirin 325 MG tablet Take 325 mg by mouth daily.        Marland Kitchen atorvastatin (LIPITOR) 20 MG tablet Take 20 mg by mouth daily.        . carvedilol (COREG) 12.5 MG tablet Take 1 tablet (12.5 mg total) by mouth 2 (two) times daily.  180 tablet  3  . Cholecalciferol (VITAMIN D3) 2000 UNITS TABS Take 2,000 Units by mouth 1 day or 1 dose.      . Coenzyme Q10 200 MG capsule Take 200 mg by mouth daily.        . ferrous sulfate 325 (65 FE)  MG tablet Take 1 tablet (325 mg total) by mouth 2 (two) times daily with a meal. Restart in 1 week  30 tablet  0  . fish oil-omega-3 fatty acids 1000 MG capsule Take 1 g by mouth 2 (two) times daily.        . furosemide (LASIX) 40 MG tablet Take 40 mg by mouth as needed. Take as needed for feet swellling      . hydrALAZINE (APRESOLINE) 25 MG tablet Take 75 mg by mouth 3 (three) times daily.      . insulin glargine (LANTUS) 100 UNIT/ML injection Inject 15 Units into the skin 2 (two) times daily.       . Liraglutide (VICTOZA [place]) Inject 1.8 Units into the skin.      Marland Kitchen losartan-hydrochlorothiazide (HYZAAR) 100-25 MG per tablet Take 1 tablet by mouth daily.  30 tablet  6  . magnesium aspartate (MAGINEX) 615 MG tablet Take 500 mg by mouth 2 (two) times daily.       . Probiotic Product (PROBIOTIC DAILY PO) Take by mouth as needed.      Marland Kitchen spironolactone (ALDACTONE) 25 MG tablet Take 1 tablet (25 mg total) by mouth daily.  30 tablet  6  No current facility-administered medications for this visit.    Allergies  Allergen Reactions  . Iohexol      Code: RASH, Desc: pt called 1 day post scanning stating that skin was red and "itching all over" some what better but still had symptom.. instructed pt to take benadryl to relieve symptoms,per dr Alvester Chou.if any problems call back/mms, Onset Date: [id number]   . Sulfa Antibiotics     Past Medical History  Diagnosis Date  . Claudication   . Diabetes mellitus   . Hypertension   . Hyperlipidemia   . DJD (degenerative joint disease)   . PAD (peripheral artery disease)   . Coronary artery disease   . Leg pain   . Carotid artery occlusion   . Obesity   . DDD (degenerative disc disease)   . DJD (degenerative joint disease)   . Shortness of breath     exertion  . Anxiety   . Diabetic coma   . Edema     Past Surgical History  Procedure Laterality Date  . Cardiac catheterization  12/01/2009    EF 65%  . Removal of fibrous cyst from right breast  10+  YEARS  . Retinal detachment surgery  10+ YEARS    LEFT EYE  . Coronary artery bypass graft  12/08/2009    LIMA GRAFT TO THE DISTAL LAD AND SAPHENOUS VEIN GRAFT TO THE OBTUSE MARGINAL VESSEL  . Transthoracic echocardiogram  12/01/2009    EF 60-65%  . Cardiovascular stress test  11/28/2009    EF 75%  . Aorta - bilateral femoral artery bypass graft  05/25/2011    Procedure: AORTA BIFEMORAL BYPASS GRAFT;  Surgeon: Mal Misty, MD;  Location: [facility] OR;  Service: Vascular;  Laterality: N/A;  . Pr vein bypass graft,aorto-fem-pop  05/25/11    History  Smoking status  . Former Smoker -- 20 years  . Types: Cigarettes  . Quit date: 07/10/1991  Smokeless tobacco  . Never Used    History  Alcohol Use No    Family History  Problem Relation Age of Onset  . Hypertension Mother   . Coronary artery disease Father   . Hypertension Sister   . Cirrhosis Brother   . Kidney disease Daughter     Review of Systems: The review of systems is per the HPI.  All other systems were reviewed and are negative.  Physical Exam: BP 150/48  Pulse 65  Ht 5\' 2"  (1.575 m)  Wt 230 lb 6.4 oz (104.509 kg)  BMI 42.13 kg/m2 BP by me is 148/40. Her wrist cuff reads  Patient is very pleasant and in no acute distress. She remains obese. Skin is warm and dry. Color is normal.  HEENT is unremarkable. Normocephalic/atraumatic. PERRL. Sclera are nonicteric. Neck is supple. No masses. No JVD. Lungs are clear. Cardiac exam shows a regular rate and rhythm. Abdomen is soft. Extremities are with trace edema. Gait and ROM are intact. No gross neurologic deficits noted.  LABORATORY DATA: BMET is pending  Lab Results  Component Value Date   WBC 5.7 10/01/2012   HGB 12.2 10/01/2012   HCT 37.5 10/01/2012   PLT 168.0 10/01/2012   GLUCOSE 208* 10/22/2012   CHOL  Value: 265        ATP III CLASSIFICATION:  <200     mg/dL   Desirable  200-239  mg/dL   Borderline High  >=240    mg/dL   High* 03/19/2007   TRIG 304* 03/19/2007   HDL 48  03/19/2007   LDLCALC  Value: 156        Total Cholesterol/HDL:CHD Risk Coronary Heart Disease Risk Table                     Men   Women  1/2 Average Risk   3.4   3.3* 03/19/2007   ALT 22 09/18/2011   AST 19 09/18/2011   NA 141 10/22/2012   K 4.3 10/22/2012   CL 107 10/22/2012   CREATININE 1.3* 10/22/2012   BUN 29* 10/22/2012   CO2 25 10/22/2012   TSH 0.699 09/18/2011   INR 1.20 05/28/2011   HGBA1C 6.1* 06/13/2011     Assessment / Plan: 1. HTN - her cuff correlates nicely - BP has improved here but her readings from home are quite high - up to the 200's with no real low readings noted - I am increasing her Hydralazine to 100 mg TID. She will continue to monitor. She has PVD and may need to have her renal arteries looked at. See her back in a month. Check BMET today.   2. CAD - with past CABG - no symptoms reported.   3. PVD - with past aortobifem bypass in 2012 - seen by Dr. Kellie Simmering  Patient is agreeable to this plan and will call if any problems develop in the interim.   Burtis Junes, RN, ANP-C [place] [address] [place], [place]  [address]

## 2013-02-09 NOTE — Patient Instructions (Addendum)
Keep on your same medicines but I am increasing the Hydralazine 100 mg three times a day  Keep monitoring your blood pressure at home  Really restrict your salt   We will recheck your potassium level today  See Dr. Martinique in a month  Call the [facility] office at [phone] if you have any questions, problems or concerns.

## 2013-02-20 ENCOUNTER — Other Ambulatory Visit: Payer: Self-pay | Admitting: Internal Medicine

## 2013-02-20 DIAGNOSIS — N641 Fat necrosis of breast: Secondary | ICD-10-CM

## 2013-03-02 ENCOUNTER — Telehealth: Payer: Self-pay | Admitting: Cardiology

## 2013-03-02 NOTE — Telephone Encounter (Signed)
New problem   Pt need to speak to nurse concerning her blood pressure which is fluctuation  173/52, 123/54, 186/65, 170/52 please call pt.

## 2013-03-02 NOTE — Telephone Encounter (Signed)
Returned call to patient she stated her B/P has been up and down.At present B/P 144/48.Stated Truitt Merle NP increased Hydralazine to 100 mg three times a day on 02/09/13.Advised Dr.Jordan not in office today will get his advice and call her back.

## 2013-03-04 NOTE — Telephone Encounter (Signed)
Patient called Dr.Jordan advised to continue same medication.Advised to continue to monitor B/P and bring B/P diary to appointment with Dr.Jordan 04/01/13.

## 2013-04-01 ENCOUNTER — Ambulatory Visit (INDEPENDENT_AMBULATORY_CARE_PROVIDER_SITE_OTHER): Payer: Medicare Other | Admitting: Cardiology

## 2013-04-01 ENCOUNTER — Encounter: Payer: Self-pay | Admitting: Cardiology

## 2013-04-01 VITALS — BP 131/46 | HR 67 | Ht 62.0 in | Wt 227.0 lb

## 2013-04-01 DIAGNOSIS — Z9889 Other specified postprocedural states: Secondary | ICD-10-CM

## 2013-04-01 DIAGNOSIS — Z95828 Presence of other vascular implants and grafts: Secondary | ICD-10-CM

## 2013-04-01 DIAGNOSIS — I10 Essential (primary) hypertension: Secondary | ICD-10-CM

## 2013-04-01 DIAGNOSIS — I739 Peripheral vascular disease, unspecified: Secondary | ICD-10-CM

## 2013-04-01 DIAGNOSIS — I2510 Atherosclerotic heart disease of native coronary artery without angina pectoris: Secondary | ICD-10-CM

## 2013-04-01 DIAGNOSIS — R609 Edema, unspecified: Secondary | ICD-10-CM

## 2013-04-01 MED ORDER — CLONIDINE HCL 0.1 MG PO TABS: 0.1000 mg | ORAL_TABLET | Freq: Every day | ORAL | Status: DC

## 2013-04-01 NOTE — Patient Instructions (Addendum)
Continue your current therapy.  We will add clonidine 0.1 mg at bedtime daily.  We will schedule your for an ultrasound of your renal arteries.  We will see you in 6 weeks.

## 2013-04-01 NOTE — Progress Notes (Addendum)
Norma [name] Date of Birth: 24-Apr-1946 Medical Record [id number]  History of Present Illness: Norma [name] comes back today for followup of her hypertension. She has had complex vascular surgery in November of 2012 and was quite slow to recover and required a stay in the SNF. She had CABG back in June of 2011. Has had chronic swelling of her legs from venous insufficiency. Her swelling resulted in having to have her legs wrapped and even placed in Unna boots. Echo was updated - this showed a normal EF and moderate PHTN and grade 1 diastolic dysfunction.. She has had some asymptomatic bradycardia - she remains on a lower dose of her Coreg to 12.5 mg BID.   She has had difficulty over the past 2 months with hypertension. Her blood pressure fluctuates quite a bit. She was started on hydralazine and on last visit this was titrated up to 100 mg 3 times a day. She is very careful with her salt intake. By her home blood pressure recordings her blood pressure fluctuates from [id number] and Q000111Q systolic. Diastolic stented below. Pulse rate is usually in the low 60s. She states when she takes her medications she feels a little bit woozy but otherwise she feels well. She denies any chest pain or shortness of breath. Her edema is stable.  Current Outpatient Prescriptions  Medication Sig Dispense Refill  . acetaminophen (TYLENOL) 325 MG tablet Take 325-650 mg by mouth every 6 (six) hours as needed. For pain      . aspirin 325 MG tablet Take 325 mg by mouth daily.        Marland Kitchen atorvastatin (LIPITOR) 20 MG tablet Take 20 mg by mouth daily.        . carvedilol (COREG) 12.5 MG tablet Take 1 tablet (12.5 mg total) by mouth 2 (two) times daily.  180 tablet  3  . Cholecalciferol (VITAMIN D3) 2000 UNITS TABS Take 2,000 Units by mouth 1 day or 1 dose.      . Coenzyme Q10 200 MG capsule Take 200 mg by mouth daily.        . ferrous sulfate 325 (65 FE) MG tablet Take 1 tablet (325 mg total) by mouth 2 (two) times daily with a meal.  Restart in 1 week  30 tablet  0  . fish oil-omega-3 fatty acids 1000 MG capsule Take 1 g by mouth 2 (two) times daily.        . furosemide (LASIX) 40 MG tablet Take 40 mg by mouth as needed. Take as needed for feet swellling      . hydrALAZINE (APRESOLINE) 100 MG tablet Take 1 tablet (100 mg total) by mouth 3 (three) times daily.  270 tablet  3  . insulin glargine (LANTUS) 100 UNIT/ML injection Inject 15 Units into the skin 2 (two) times daily.       . Liraglutide (VICTOZA [place]) Inject 1.8 Units into the skin.      Marland Kitchen losartan-hydrochlorothiazide (HYZAAR) 100-25 MG per tablet Take 1 tablet by mouth daily.  30 tablet  6  . magnesium aspartate (MAGINEX) 615 MG tablet Take 500 mg by mouth daily.       . Probiotic Product (PROBIOTIC DAILY PO) Take by mouth as needed.      Marland Kitchen spironolactone (ALDACTONE) 25 MG tablet Take 1 tablet (25 mg total) by mouth daily.  30 tablet  6  . cloNIDine (CATAPRES) 0.1 MG tablet Take 1 tablet (0.1 mg total) by mouth at bedtime.  30 tablet  11  No current facility-administered medications for this visit.    Allergies  Allergen Reactions  . Iohexol      Code: RASH, Desc: pt called 1 day post scanning stating that skin was red and "itching all over" some what better but still had symptom.. instructed pt to take benadryl to relieve symptoms,per dr Alvester Chou.if any problems call back/mms, Onset Date: [id number]   . Sulfa Antibiotics     Past Medical History  Diagnosis Date  . Claudication   . Diabetes mellitus   . Hypertension   . Hyperlipidemia   . DJD (degenerative joint disease)   . PAD (peripheral artery disease)   . Coronary artery disease   . Leg pain   . Carotid artery occlusion   . Obesity   . DDD (degenerative disc disease)   . DJD (degenerative joint disease)   . Shortness of breath     exertion  . Anxiety   . Diabetic coma   . Edema     Past Surgical History  Procedure Laterality Date  . Cardiac catheterization  12/01/2009    EF 65%  . Removal of  fibrous cyst from right breast  10+ YEARS  . Retinal detachment surgery  10+ YEARS    LEFT EYE  . Coronary artery bypass graft  12/08/2009    LIMA GRAFT TO THE DISTAL LAD AND SAPHENOUS VEIN GRAFT TO THE OBTUSE MARGINAL VESSEL  . Transthoracic echocardiogram  12/01/2009    EF 60-65%  . Cardiovascular stress test  11/28/2009    EF 75%  . Aorta - bilateral femoral artery bypass graft  05/25/2011    Procedure: AORTA BIFEMORAL BYPASS GRAFT;  Surgeon: Mal Misty, MD;  Location: [facility] OR;  Service: Vascular;  Laterality: N/A;  . Pr vein bypass graft,aorto-fem-pop  05/25/11    History  Smoking status  . Former Smoker -- 20 years  . Types: Cigarettes  . Quit date: 07/10/1991  Smokeless tobacco  . Never Used    History  Alcohol Use No    Family History  Problem Relation Age of Onset  . Hypertension Mother   . Coronary artery disease Father   . Hypertension Sister   . Cirrhosis Brother   . Kidney disease Daughter     Review of Systems: The review of systems is per the HPI.  All other systems were reviewed and are negative.  Physical Exam: BP 131/46  Pulse 67  Ht 5\' 2"  (1.575 m)  Wt 102.967 kg (227 lb)  BMI 41.51 kg/m2 Patient is very pleasant and in no acute distress. She remains obese. Skin is warm and dry. Color is normal.  HEENT is unremarkable. Normocephalic/atraumatic. PERRL. Sclera are nonicteric. Neck is supple. No masses. No JVD. Lungs are clear. Cardiac exam shows a regular rate and rhythm. Abdomen is soft. Extremities are with trace edema. Gait and ROM are intact. No gross neurologic deficits noted.  LABORATORY DATA:   Lab Results  Component Value Date   WBC 5.7 10/01/2012   HGB 12.2 10/01/2012   HCT 37.5 10/01/2012   PLT 168.0 10/01/2012   GLUCOSE 303* 02/09/2013   CHOL  Value: 265        ATP III CLASSIFICATION:  <200     mg/dL   Desirable  200-239  mg/dL   Borderline High  >=240    mg/dL   High* 03/19/2007   TRIG 304* 03/19/2007   HDL 48 03/19/2007   LDLCALC  Value:  156  Total Cholesterol/HDL:CHD Risk Coronary Heart Disease Risk Table                     Men   Women  1/2 Average Risk   3.4   3.3* 03/19/2007   ALT 22 09/18/2011   AST 19 09/18/2011   NA 139 02/09/2013   K 5.2* 02/09/2013   CL 107 02/09/2013   CREATININE 1.5* 02/09/2013   BUN 32* 02/09/2013   CO2 25 02/09/2013   TSH 0.699 09/18/2011   INR 1.20 05/28/2011   HGBA1C 6.1* 06/13/2011     Assessment / Plan: 1. HTN - blood pressure has improved but is still variable. She is no longer getting readings over 200. She is very concerned about her high blood pressure. I recommended adding clonidine 0.1 mg at bedtime. We will schedule her for renal artery duplex. When she had her complex vascular surgery in 2012 she had significant endarterectomy of the aorta above the renal arteries and into the origin of the renal arteries bilaterally. We will see her back again in 6 weeks.  2. CAD - with past CABG - no symptoms reported.   3. PVD - with past aortobifem bypass in 2012 - seen by Dr. Kellie Simmering  ECG today demonstrates normal sinus rhythm with rightward axis. There ST-T wave changes consistent with inferior lateral ischemia.

## 2013-04-07 ENCOUNTER — Telehealth: Payer: Self-pay | Admitting: Cardiology

## 2013-04-07 NOTE — Telephone Encounter (Signed)
Patient stated she forgot to tell Dr.Jordan at her last office visit she has been having sensations in her back,legs,chest in which she feels like" fluid dripping".Patient was told will let Dr.Jordan know 04/08/13 and call her back.

## 2013-04-07 NOTE — Telephone Encounter (Signed)
Returned call to patient schedulers will be calling this week to schedule renal duplex.

## 2013-04-07 NOTE — Telephone Encounter (Signed)
New Problem:  Pt states she wants to know the status of her kidney procedure. Pt wants to talk to cheryl

## 2013-04-09 ENCOUNTER — Telehealth: Payer: Self-pay | Admitting: Nurse Practitioner

## 2013-04-09 NOTE — Telephone Encounter (Signed)
Pt still having problem with BP and medication, said Cecille Rubin was helping her with it

## 2013-04-09 NOTE — Telephone Encounter (Signed)
Pt calls today b/c she is concerned about her blood pressure. No headache or blurred vision noted  bp today is 178/60  HR 59  States she still "feel dripping inside her legs, back & chest" at times. Also states she is congested & may have a virus.  Recommended she follow-up with pcp regarding this. She agrees  She will bring her bp cuff in next week when she comes in for renal duplex scan  I will forward this to Dr. Martinique & Alvira Philips RN

## 2013-04-10 NOTE — Telephone Encounter (Signed)
Returned call to patient she stated she continues to have sensation in which she feels a dripping in legs,chest ,back.Stated her B/P was better today.Stated B/P is up and down. Advised to keep appointment for renal dopplers 04/17/13 and appointment with Dr.Jordan 04/22/13.

## 2013-04-10 NOTE — Telephone Encounter (Signed)
Returned call to patient no answer.LMTC. 

## 2013-04-16 ENCOUNTER — Telehealth: Payer: Self-pay | Admitting: Cardiology

## 2013-04-16 ENCOUNTER — Other Ambulatory Visit: Payer: Self-pay | Admitting: Internal Medicine

## 2013-04-16 ENCOUNTER — Ambulatory Visit
Admission: RE | Admit: 2013-04-16 | Discharge: 2013-04-16 | Disposition: A | Discharge status: Home / Self Care | Payer: Medicare Other | Source: Ambulatory Visit | Attending: Internal Medicine | Admitting: Internal Medicine

## 2013-04-16 DIAGNOSIS — N641 Fat necrosis of breast: Secondary | ICD-10-CM

## 2013-04-16 NOTE — Telephone Encounter (Signed)
New message   Having kidney test tomorrow. Concerned that b/p is 204-64 this am. Please call after 12:00

## 2013-04-16 NOTE — Telephone Encounter (Signed)
Returned call to patient she stated she is very concerned her B/P is up and down.B/P this morning 204/64 this pm 169/54. Stated she continues to have a sensation in which she feels fluid dripping inside her body,may be dripping in her back,arms,legs.Stated she knows this is not normal and would like to know what is causing this.Will let Dr.Jordan know.Advised to keep appointment for renal dopplers 04/17/13 and keep appointment with Dr.Jordan 04/22/13.

## 2013-04-16 NOTE — Telephone Encounter (Signed)
See previous 04/16/13 note.

## 2013-04-17 ENCOUNTER — Ambulatory Visit (HOSPITAL_COMMUNITY): Payer: Medicare Other | Attending: Cardiology

## 2013-04-17 DIAGNOSIS — I701 Atherosclerosis of renal artery: Secondary | ICD-10-CM

## 2013-04-17 DIAGNOSIS — I2510 Atherosclerotic heart disease of native coronary artery without angina pectoris: Secondary | ICD-10-CM | POA: Insufficient documentation

## 2013-04-17 DIAGNOSIS — Z951 Presence of aortocoronary bypass graft: Secondary | ICD-10-CM | POA: Insufficient documentation

## 2013-04-17 DIAGNOSIS — I10 Essential (primary) hypertension: Secondary | ICD-10-CM | POA: Insufficient documentation

## 2013-04-17 DIAGNOSIS — E119 Type 2 diabetes mellitus without complications: Secondary | ICD-10-CM | POA: Insufficient documentation

## 2013-04-17 DIAGNOSIS — I739 Peripheral vascular disease, unspecified: Secondary | ICD-10-CM | POA: Insufficient documentation

## 2013-04-17 DIAGNOSIS — E785 Hyperlipidemia, unspecified: Secondary | ICD-10-CM | POA: Insufficient documentation

## 2013-04-21 ENCOUNTER — Encounter: Payer: Self-pay | Admitting: Cardiology

## 2013-04-21 ENCOUNTER — Ambulatory Visit (INDEPENDENT_AMBULATORY_CARE_PROVIDER_SITE_OTHER): Payer: Medicare Other | Admitting: Cardiology

## 2013-04-21 VITALS — BP 180/68 | HR 74 | Ht 62.0 in | Wt 224.8 lb

## 2013-04-21 DIAGNOSIS — I2510 Atherosclerotic heart disease of native coronary artery without angina pectoris: Secondary | ICD-10-CM

## 2013-04-21 DIAGNOSIS — I739 Peripheral vascular disease, unspecified: Secondary | ICD-10-CM

## 2013-04-21 DIAGNOSIS — I10 Essential (primary) hypertension: Secondary | ICD-10-CM

## 2013-04-21 DIAGNOSIS — R609 Edema, unspecified: Secondary | ICD-10-CM

## 2013-04-21 MED ORDER — CLONIDINE HCL 0.1 MG PO TABS: 0.1000 mg | ORAL_TABLET | Freq: Two times a day (BID) | ORAL | Status: DC

## 2013-04-21 MED ORDER — HYDRALAZINE HCL 50 MG PO TABS: 50.0000 mg | ORAL_TABLET | Freq: Three times a day (TID) | ORAL | Status: DC

## 2013-04-21 NOTE — Patient Instructions (Addendum)
We will increase clonidine to 0.1 mg twice a day  Decrease hydralazine to 50 mg three times a day.  Continue your other therapy.  I will see you in 3 months.

## 2013-04-21 NOTE — Progress Notes (Signed)
Norma [name] Date of Birth: Dec 01, 1945 Medical Record [id number]  History of Present Illness: Norma [name] comes back today for followup of her hypertension. She has had complex vascular surgery in November of 2012 and was quite slow to recover and required a stay in the [facility]. She had CABG back in June of 2011. Has had chronic swelling of her legs from venous insufficiency. Her swelling resulted in having to have her legs wrapped and even placed in Unna boots. Echo was updated - this showed a normal EF and moderate PHTN and grade 1 diastolic dysfunction.. She has had some asymptomatic bradycardia - she remains on a lower dose of her Coreg to 12.5 mg BID.   She has had difficulty over the past 3-4 months with hypertension. Her blood pressure fluctuates quite a bit. On her last visit we added clonidine 0.1 mg at bedtime. She states her blood pressure often is well controlled but she still has some fluctuation up to the [id number] systolic. Sometimes she feels her heart beating real fast but this only lasts for a couple of minutes. She has lost 3 pounds since her last visit. She complains that it internally she feels and hears a cold dripping sensation in her chest arms back and legs. This started about one month ago.  Current Outpatient Prescriptions  Medication Sig Dispense Refill  . acetaminophen (TYLENOL) 325 MG tablet Take 325-650 mg by mouth every 6 (six) hours as needed. For pain      . aspirin 325 MG tablet Take 325 mg by mouth daily.        Marland Kitchen atorvastatin (LIPITOR) 20 MG tablet Take 20 mg by mouth daily.        . carvedilol (COREG) 12.5 MG tablet Take 1 tablet (12.5 mg total) by mouth 2 (two) times daily.  180 tablet  3  . Cholecalciferol (VITAMIN D3) 2000 UNITS TABS Take 2,000 Units by mouth 1 day or 1 dose.      . cloNIDine (CATAPRES) 0.1 MG tablet Take 1 tablet (0.1 mg total) by mouth 2 (two) times daily.  180 tablet  3  . Coenzyme Q10 200 MG capsule Take 200 mg by mouth daily.        . ferrous  sulfate 325 (65 FE) MG tablet Take 1 tablet (325 mg total) by mouth 2 (two) times daily with a meal. Restart in 1 week  30 tablet  0  . fish oil-omega-3 fatty acids 1000 MG capsule Take 1 g by mouth 2 (two) times daily.        . furosemide (LASIX) 40 MG tablet Take 40 mg by mouth as needed. Take as needed for feet swellling      . insulin glargine (LANTUS) 100 UNIT/ML injection Inject 15 Units into the skin 2 (two) times daily.       . Liraglutide (VICTOZA ) Inject 1.8 Units into the skin.      Marland Kitchen losartan-hydrochlorothiazide (HYZAAR) 100-25 MG per tablet Take 1 tablet by mouth daily.  30 tablet  6  . magnesium aspartate (MAGINEX) 615 MG tablet Take 500 mg by mouth daily.       . Probiotic Product (PROBIOTIC DAILY PO) Take by mouth as needed.      Marland Kitchen spironolactone (ALDACTONE) 25 MG tablet Take 1 tablet (25 mg total) by mouth daily.  30 tablet  6  . hydrALAZINE (APRESOLINE) 50 MG tablet Take 1 tablet (50 mg total) by mouth 3 (three) times daily.  270 tablet  3  No current facility-administered medications for this visit.    Allergies  Allergen Reactions  . Iohexol      Code: RASH, Desc: pt called 1 day post scanning stating that skin was red and "itching all over" some what better but still had symptom.. instructed pt to take benadryl to relieve symptoms,per dr Alvester Chou.if any problems call back/mms, Onset Date: [id number]   . Sulfa Antibiotics     Past Medical History  Diagnosis Date  . Claudication   . Diabetes mellitus   . Hypertension   . Hyperlipidemia   . DJD (degenerative joint disease)   . PAD (peripheral artery disease)   . Coronary artery disease   . Leg pain   . Carotid artery occlusion   . Obesity   . DDD (degenerative disc disease)   . DJD (degenerative joint disease)   . Shortness of breath     exertion  . Anxiety   . Diabetic coma   . Edema     Past Surgical History  Procedure Laterality Date  . Cardiac catheterization  12/01/2009    EF 65%  . Removal of  fibrous cyst from right breast  10+ YEARS  . Retinal detachment surgery  10+ YEARS    LEFT EYE  . Coronary artery bypass graft  12/08/2009    LIMA GRAFT TO THE DISTAL LAD AND SAPHENOUS VEIN GRAFT TO THE OBTUSE MARGINAL VESSEL  . Transthoracic echocardiogram  12/01/2009    EF 60-65%  . Cardiovascular stress test  11/28/2009    EF 75%  . Aorta - bilateral femoral artery bypass graft  05/25/2011    Procedure: AORTA BIFEMORAL BYPASS GRAFT;  Surgeon: Mal Misty, MD;  Location: [facility] OR;  Service: Vascular;  Laterality: N/A;  . Pr vein bypass graft,aorto-fem-pop  05/25/11    History  Smoking status  . Former Smoker -- 20 years  . Types: Cigarettes  . Quit date: 07/10/1991  Smokeless tobacco  . Never Used    History  Alcohol Use No    Family History  Problem Relation Age of Onset  . Hypertension Mother   . Coronary artery disease Father   . Hypertension Sister   . Cirrhosis Brother   . Kidney disease Daughter     Review of Systems: The review of systems is per the HPI.  All other systems were reviewed and are negative.  Physical Exam: BP 180/68  Pulse 74  Ht 5\' 2"  (1.575 m)  Wt 224 lb 12.8 oz (101.969 kg)  BMI 41.11 kg/m2 Patient is very pleasant and in no acute distress. She remains obese. Skin is warm and dry. Color is normal.  HEENT is unremarkable. Normocephalic/atraumatic. PERRL. Sclera are nonicteric. Neck is supple. No masses. No JVD. Lungs are clear. Cardiac exam shows a regular rate and rhythm. Abdomen is soft. Extremities are with trace edema. Gait and ROM are intact. No gross neurologic deficits noted.  LABORATORY DATA:   Lab Results  Component Value Date   WBC 5.7 10/01/2012   HGB 12.2 10/01/2012   HCT 37.5 10/01/2012   PLT 168.0 10/01/2012   GLUCOSE 303* 02/09/2013   CHOL  Value: 265        ATP III CLASSIFICATION:  <200     mg/dL   Desirable  200-239  mg/dL   Borderline High  >=240    mg/dL   High* 03/19/2007   TRIG 304* 03/19/2007   HDL 48 03/19/2007   LDLCALC   Value: 156  Total Cholesterol/HDL:CHD Risk Coronary Heart Disease Risk Table                     Men   Women  1/2 Average Risk   3.4   3.3* 03/19/2007   ALT 22 09/18/2011   AST 19 09/18/2011   NA 139 02/09/2013   K 5.2* 02/09/2013   CL 107 02/09/2013   CREATININE 1.5* 02/09/2013   BUN 32* 02/09/2013   CO2 25 02/09/2013   TSH 0.699 09/18/2011   INR 1.20 05/28/2011   HGBA1C 6.1* 06/13/2011   Recent renal duplex study demonstrated normal abdominal aortic caliber. The kidneys were normal in size bilaterally. There is no evidence of hydronephrosis. Velocities were elevated bilaterally however renal /aortic ratios were normal.  Assessment / Plan: 1. HTN - blood pressure has improved but is still variable. I do not understand the symptoms she describes of a dripping feeling. However, this appears to started with increasing her hydralazine dose. We'll reduce this back to 50 mg 3 times a day. We will increase her clonidine to 0.1 mg twice a day. I'll followup again in 3 months. When she had her complex vascular surgery in 2012 she had significant endarterectomy of the aorta above the renal arteries and into the origin of the renal arteries bilaterally. Her renal duplex study is somewhat equivocal. She does have elevated velocities but normal ratio. Her renal sizes are normal. Given her complex anatomy I am not inclined to recommend angiography at this time.  2. CAD - with past CABG - no symptoms reported.   3. PVD - with past aortobifem bypass in 2012 - seen by Dr. Kellie Simmering

## 2013-04-22 ENCOUNTER — Ambulatory Visit: Payer: Medicare Other | Admitting: Cardiology

## 2013-04-27 NOTE — Telephone Encounter (Signed)
We recently reduced her hydralazine to see if this would impact her sensation of "dripping". We increased her clonidine. If her blood pressure remains high we may need to increase clonidine further. I do not know what is causing this unusual dripping sensation. She should start by seeing her primary care about this.  Peter Martinique MD, [facility]

## 2013-04-27 NOTE — Telephone Encounter (Signed)
Pt called today again with BP issues. She said her BP this morning before taking her BP medication Hydralazine 50 mg and Carvedilol 194/64 30 minutes after taking the medication was 181/61. Now BP 174/61 heart rate 56 beats/minute. Pt states does not take all her BP medication all at once because she take lots of medications so she will take her Clonidine about 9:30 AM today. Pt does not understand that the medication will take effect at least 1.5 hours after taking the medication. She does not want to hear any advice.  Pt. Also states she has tolled Dr. Martinique and [facility] about the sensation she feel like a cool drippings in her extremities and all enside her body, and she can hear her heart beating all the time. Pt states this is not normal and if MD does not know what is causing this symptoms he needs to recommend her to someone else.

## 2013-04-27 NOTE — Telephone Encounter (Signed)
Follow Up:  Pt states she has some questions regarding her BP. Please advise

## 2013-04-27 NOTE — Telephone Encounter (Signed)
Pt is aware that this messages will be send to Dr. Martinique for reviewing.

## 2013-04-28 ENCOUNTER — Telehealth: Payer: Self-pay | Admitting: Cardiology

## 2013-04-28 NOTE — Telephone Encounter (Signed)
Follow up    Pt has questions about her meds, and BP.    Pt still feeling like dripping in her body.   Pt is very concerned, please give her a call back.       Thanks!

## 2013-04-28 NOTE — Telephone Encounter (Signed)
See previous 04/28/13 note.

## 2013-04-28 NOTE — Telephone Encounter (Signed)
Returned call to patient Dr.Jordan advised he reduced your hydralazine to see if it helped "dripping"sensation.Patient stated she is dizzy today and continues to have "dripping" sensation.Dr.Jordan advised to see PCP about the "dripping"sensation.B/P 171/49 pulse 62.Stated her B/P is up and down.

## 2013-05-04 NOTE — Telephone Encounter (Signed)
04/23/13 renal dopplers faxed to [place].

## 2013-05-15 ENCOUNTER — Telehealth: Payer: Self-pay | Admitting: Nurse Practitioner

## 2013-05-15 NOTE — Telephone Encounter (Signed)
New Problem  Pt calling for BP// states she is on 5 different medications and nothing seems to be working// requests a call back

## 2013-05-15 NOTE — Telephone Encounter (Signed)
Called stating her BP is still elevated.  Today 182/58;194/64. Yest 11/6 was 182/58 and 199/64. States she has been all night worrying about BP.  C/O of "ringing in ears;dizziness;drowsy all the time". States she has told all her drs about this but "no one seems to listen".  Reviewed her medications.  She states she takes her Clonidine at 4 pm and 11pm.  Spoke w/Lori Gerhardt,NP since she has seen her in the past.  She advises for her to take dose of Clonidine now and start taking in the mornings; and if has further questions will need to call Dr. Martinique.  She states she will take the dose of Clonidine now but she feels like "no one is listening to her about her BP".  Tried to reassure her that overall her BP is not ideal but is not dangerously high. She has talked with her pharmacist and he will review her medications for side effects ie dizziness, ringing in her ears.  She has also seen Dr. Karlton Lemon and "she doesn't seem to have an answer either".  Advised to call back if BP doesn't level.  She will follow instructions but states "someone needs to get BP under control".

## 2013-05-18 DIAGNOSIS — I10 Essential (primary) hypertension: Secondary | ICD-10-CM

## 2013-05-18 DIAGNOSIS — I701 Atherosclerosis of renal artery: Secondary | ICD-10-CM

## 2013-09-23 ENCOUNTER — Other Ambulatory Visit: Payer: Self-pay

## 2013-09-23 MED ORDER — CARVEDILOL 12.5 MG PO TABS: 12.5000 mg | ORAL_TABLET | Freq: Two times a day (BID) | ORAL | Status: DC

## 2013-10-08 ENCOUNTER — Ambulatory Visit (INDEPENDENT_AMBULATORY_CARE_PROVIDER_SITE_OTHER): Payer: Medicare Other | Admitting: Cardiology

## 2013-10-08 ENCOUNTER — Encounter: Payer: Self-pay | Admitting: Cardiology

## 2013-10-08 VITALS — BP 148/50 | HR 67 | Ht 62.0 in | Wt 225.8 lb

## 2013-10-08 DIAGNOSIS — R609 Edema, unspecified: Secondary | ICD-10-CM

## 2013-10-08 DIAGNOSIS — R002 Palpitations: Secondary | ICD-10-CM

## 2013-10-08 DIAGNOSIS — I2510 Atherosclerotic heart disease of native coronary artery without angina pectoris: Secondary | ICD-10-CM

## 2013-10-08 DIAGNOSIS — I10 Essential (primary) hypertension: Secondary | ICD-10-CM

## 2013-10-08 DIAGNOSIS — I739 Peripheral vascular disease, unspecified: Secondary | ICD-10-CM

## 2013-10-08 NOTE — Progress Notes (Signed)
Norma [name] Date of Birth: [birth year] Medical Record [id number]  History of Present Illness: Norma [name] comes back today for followup of her hypertension. She has had complex vascular surgery in November of 2012. She had CABG back in June of 2011. Has had chronic swelling of her legs from venous insufficiency. Her swelling resulted in having to have her legs wrapped and even placed in Unna boots. Echo  showed a normal EF and moderate PHTN and grade 1 diastolic dysfunction.. She has had some asymptomatic bradycardia - she remains on a lower dose of her Coreg to 12.5 mg BID.   Her BP has been very difficult to control. Dr. Karlton Lemon switched her hydralazine to Bidil three times a day and started her on Edarbyclor 40/25 mg daily since this change her BP has done much better. She is tolerating her medications well. She continues to wear support hose and takes lasix daily. She avoids salt. She does complain of episodes of her heart beating fast. This is intermittent and does not occur every day. No chest pain, SOB, or dizziness.  Current Outpatient Prescriptions  Medication Sig Dispense Refill  . acetaminophen (TYLENOL) 325 MG tablet Take 325-650 mg by mouth every 6 (six) hours as needed. For pain      . aspirin 325 MG tablet Take 81 mg by mouth daily.       Marland Kitchen atorvastatin (LIPITOR) 20 MG tablet Take 20 mg by mouth daily.        . Azilsartan-Chlorthalidone (EDARBYCLOR) 40-25 MG TABS Take by mouth daily.      . carvedilol (COREG) 12.5 MG tablet Take 1 tablet (12.5 mg total) by mouth 2 (two) times daily.  180 tablet  3  . Cholecalciferol (VITAMIN D3) 2000 UNITS TABS Take 2,000 Units by mouth 1 day or 1 dose.      . cloNIDine (CATAPRES) 0.1 MG tablet Take 1 tablet (0.1 mg total) by mouth 2 (two) times daily.  180 tablet  3  . Coenzyme Q10 200 MG capsule Take 200 mg by mouth daily.        . fish oil-omega-3 fatty acids 1000 MG capsule Take 1 g by mouth 2 (two) times daily.        . furosemide (LASIX) 40 MG  tablet Take 40 mg by mouth as needed. Take as needed for feet swellling      . insulin glargine (LANTUS) 100 UNIT/ML injection Inject 16 Units into the skin 2 (two) times daily.       . isosorbide-hydrALAZINE (BIDIL) 20-37.5 MG per tablet Take 2 tablets by mouth 2 (two) times daily.      . Liraglutide (VICTOZA [place]) Inject 1.8 Units into the skin.      . magnesium aspartate (MAGINEX) 615 MG tablet Take 500 mg by mouth daily.       . Probiotic Product (PROBIOTIC DAILY PO) Take by mouth as needed.      Marland Kitchen spironolactone (ALDACTONE) 25 MG tablet Take 1 tablet (25 mg total) by mouth daily.  30 tablet  6   No current facility-administered medications for this visit.    Allergies  Allergen Reactions  . Iohexol      Code: RASH, Desc: pt called 1 day post scanning stating that skin was red and "itching all over" some what better but still had symptom.. instructed pt to take benadryl to relieve symptoms,per dr Alvester Chou.if any problems call back/mms, Onset Date: [id number]   . Sulfa Antibiotics     Past Medical History  Diagnosis Date  . Claudication   . Diabetes mellitus   . Hypertension   . Hyperlipidemia   . DJD (degenerative joint disease)   . PAD (peripheral artery disease)   . Coronary artery disease   . Leg pain   . Carotid artery occlusion   . Obesity   . DDD (degenerative disc disease)   . DJD (degenerative joint disease)   . Shortness of breath     exertion  . Anxiety   . Diabetic coma   . Edema     Past Surgical History  Procedure Laterality Date  . Cardiac catheterization  12/01/2009    EF 65%  . Removal of fibrous cyst from right breast  10+ YEARS  . Retinal detachment surgery  10+ YEARS    LEFT EYE  . Coronary artery bypass graft  12/08/2009    LIMA GRAFT TO THE DISTAL LAD AND SAPHENOUS VEIN GRAFT TO THE OBTUSE MARGINAL VESSEL  . Transthoracic echocardiogram  12/01/2009    EF 60-65%  . Cardiovascular stress test  11/28/2009    EF 75%  . Aorta - bilateral femoral artery  bypass graft  05/25/2011    Procedure: AORTA BIFEMORAL BYPASS GRAFT;  Surgeon: Mal Misty, MD;  Location: [facility] OR;  Service: Vascular;  Laterality: N/A;  . Pr vein bypass graft,aorto-fem-pop  05/25/11    History  Smoking status  . Former Smoker -- 20 years  . Types: Cigarettes  . Quit date: 07/10/1991  Smokeless tobacco  . Never Used    History  Alcohol Use No    Family History  Problem Relation Age of Onset  . Hypertension Mother   . Coronary artery disease Father   . Hypertension Sister   . Cirrhosis Brother   . Kidney disease Daughter     Review of Systems: The review of systems is per the HPI.  All other systems were reviewed and are negative.  Physical Exam: BP 148/50  Pulse 67  Ht 5\' 2"  (1.575 m)  Wt 225 lb 12.8 oz (102.422 kg)  BMI 41.29 kg/m2  SpO2 98% Patient is very pleasant and in no acute distress. She remains obese. Skin is warm and dry. Color is normal.  HEENT is unremarkable. Normocephalic/atraumatic. PERRL. Sclera are nonicteric. Neck is supple. No masses. No JVD. Lungs are clear. Cardiac exam shows a regular rate and rhythm. Normal S1-2, no gallop or murmur. Abdomen is soft. NT, BS +. No masses. Extremities are with 1+ edema. Gait and ROM are intact. No gross neurologic deficits noted.  LABORATORY DATA:   Lab Results  Component Value Date   WBC 5.7 10/01/2012   HGB 12.2 10/01/2012   HCT 37.5 10/01/2012   PLT 168.0 10/01/2012   GLUCOSE 303* 02/09/2013   CHOL  Value: 265        ATP III CLASSIFICATION:  <200     mg/dL   Desirable  200-239  mg/dL   Borderline High  >=240    mg/dL   High* 03/19/2007   TRIG 304* 03/19/2007   HDL 48 03/19/2007   LDLCALC  Value: 156        Total Cholesterol/HDL:CHD Risk Coronary Heart Disease Risk Table                     Men   Women  1/2 Average Risk   3.4   3.3* 03/19/2007   ALT 22 09/18/2011   AST 19 09/18/2011   NA 139 02/09/2013   K 5.2*  02/09/2013   CL 107 02/09/2013   CREATININE 1.5* 02/09/2013   BUN 32* 02/09/2013   CO2 25  02/09/2013   TSH 0.699 09/18/2011   INR 1.20 05/28/2011   HGBA1C 6.1* 06/13/2011   Prior renal duplex study demonstrated normal abdominal aortic caliber. The kidneys were normal in size bilaterally. There is no evidence of hydronephrosis. Velocities were elevated bilaterally however renal /aortic ratios were normal.  Assessment / Plan: 1. HTN - blood pressure has improved significantly with recent change in medications. I have made no changes today.  When she had her complex vascular surgery in 2012 she had significant endarterectomy of the aorta above the renal arteries and into the origin of the renal arteries bilaterally. Her renal duplex study is somewhat equivocal. She does have elevated velocities but normal ratio. Her renal sizes are normal. Given her complex anatomy I am not inclined to recommend angiography at this time.  2. CAD - with past CABG - no symptoms reported.   3. PVD - with past aortobifem bypass in 2012 - seen by Dr. Kellie Simmering  4. Palpitations with intermittent racing. Will have her wear an event monitor to make sure she is not having Afib or other serious rhythm problems. Since her symptoms are sporadic a Holter monitor is inadequate. I will follow up in 3 months.

## 2013-10-08 NOTE — Patient Instructions (Signed)
We will have you wear an event monitor.  Continue your current therapy  I will see you in 3 months.

## 2013-10-13 ENCOUNTER — Encounter (INDEPENDENT_AMBULATORY_CARE_PROVIDER_SITE_OTHER): Payer: Medicare Other

## 2013-10-13 ENCOUNTER — Encounter: Payer: Self-pay | Admitting: *Deleted

## 2013-10-13 ENCOUNTER — Telehealth: Payer: Self-pay | Admitting: Cardiology

## 2013-10-13 DIAGNOSIS — R002 Palpitations: Secondary | ICD-10-CM

## 2013-10-13 NOTE — Progress Notes (Signed)
Patient ID: Norma [name], female   DOB: 1945/12/02, 68 y.o.   MRN: [id number] E-Cardio [place] 30 day cardiac event monitor applied to patient.

## 2013-10-13 NOTE — Telephone Encounter (Signed)
Returned call to patient she stated she got event monitor today.Stated she wanted Dr.Jordan to know she continues to have "drips".Stated she feels like fluid dripping inside different parts of her body off and on.Also she is having a sensation of cold air inside and outside her body.Stated the cold air sensation goes up into her nose and eyes.Stated she has to sit with a heater at her feet to keep warm.Stated no fever.Continues to have a feeling in which she has a  "catch in her heart". Advised Dr.Jordan out of office will send message to him for review.

## 2013-10-13 NOTE — Telephone Encounter (Signed)
New problem   Pt need to speak back with you concerning some problems she has been having.

## 2013-10-14 NOTE — Telephone Encounter (Signed)
The event monitor is there to evaluate her symptoms of racing in her heart. We have discussed her symptoms of dripping extensively in the past and I do not know what else to tell her about this. Continue current therapy.  Peter Martinique MD, [facility]

## 2013-10-14 NOTE — Telephone Encounter (Signed)
Returned call to patient Dr.Jordan advised continue to wear monitor to evaluate symptoms of racing heart.Advised he already discussed symptoms of dripping sensation.Continue current therapy.

## 2013-10-27 ENCOUNTER — Telehealth: Payer: Self-pay | Admitting: Cardiology

## 2013-10-27 ENCOUNTER — Telehealth: Payer: Self-pay

## 2013-10-27 NOTE — Telephone Encounter (Signed)
Patient called for samples of bidill and edarbyclor placed samples up front

## 2013-11-10 NOTE — Telephone Encounter (Signed)
Returned call to patient she stated she will be mailing back event monitor Thursday 11/12/13.Stated she wanted to know when she will hear results.Stated she continues to have "massive air sensation" inside and outside her body.Stated she has this sensation off and on every day.Stated it is not painful just uncomfortable,makes her cold.Also stated her B/P has been elevated.Stated after she takes her medication B/P goes down.Advised Dr.Jordan out of office this week.Should have monitor results next week,will discuss with Dr.Jordan and call you back.

## 2013-11-10 NOTE — Telephone Encounter (Signed)
New message     Talk to a nurse about the heart monitor she is wearing.

## 2013-11-16 ENCOUNTER — Telehealth: Payer: Self-pay

## 2013-11-16 NOTE — Telephone Encounter (Signed)
Follow up     Want nurse to know that she mailed the monitor back last Thursday.

## 2013-11-16 NOTE — Telephone Encounter (Signed)
Patient called in for samples of bidil we did not have any, the ones we had, had expired. Sent her an patient Designer, television/film set

## 2013-11-17 ENCOUNTER — Telehealth: Payer: Self-pay | Admitting: Cardiology

## 2013-11-17 NOTE — Telephone Encounter (Signed)
New message     Talk to the nurse about the monitor she is wearing

## 2013-11-17 NOTE — Telephone Encounter (Signed)
See previous 11/17/13 note.

## 2013-11-17 NOTE — Telephone Encounter (Signed)
Returned call to patient she wanted to know if we had samples of Bidil and Edarbyclor 40/25 mg.Office out of both medications.She stated she mailed in monitor last week.Advised Dr.Jordan will review monitor 11/19/13 and I will call back with results.

## 2013-11-19 NOTE — Telephone Encounter (Signed)
Returned call to patient Dr.Jordan reviewed event monitor which revealed normal sinus rhythm.Advised cold air sensation she is having is not heart related.Advised to call PCP.Copy of monitor report faxed to Dr.Kimberly [facility].

## 2013-11-27 ENCOUNTER — Telehealth: Payer: Self-pay | Admitting: Cardiology

## 2013-11-27 NOTE — Telephone Encounter (Signed)
Patient would like to speak with you regarding monitor. Please call after 10:30am.

## 2013-11-27 NOTE — Telephone Encounter (Signed)
Returned call to patient she stated since her monitor was normal does she need to keep her appointment with Dr.Jordan 12/08/13.Will check with Dr.Jordan and call you back.

## 2013-11-27 NOTE — Telephone Encounter (Signed)
Returned call to patient.Spoke to [place] he advised 3 month follow up.12/08/13 appointment cancelled.Appointment scheduled with Dr.Jordan at [facility] 03/01/14 at 2:15 pm.

## 2013-12-08 ENCOUNTER — Ambulatory Visit: Payer: Medicare Other | Admitting: Cardiology

## 2014-01-05 NOTE — Telephone Encounter (Signed)
E 

## 2014-01-15 ENCOUNTER — Telehealth: Payer: Self-pay | Admitting: Cardiology

## 2014-01-15 NOTE — Telephone Encounter (Signed)
Returned call to patient she stated she would like samples.Samples of Bidil 20/37.5 mg and Edarbyclor 40/25 mg left at front desk of our [place] office.

## 2014-01-15 NOTE — Telephone Encounter (Signed)
Per answering service pt wants Norma [name] to call her about her medication.

## 2014-01-29 ENCOUNTER — Telehealth: Payer: Self-pay | Admitting: Cardiology

## 2014-01-29 NOTE — Telephone Encounter (Signed)
Would like samples of Bildil 20/37.5 mg and Edbyclor 40/25 mg.  Can leave message on her answering machine.

## 2014-01-29 NOTE — Telephone Encounter (Signed)
Returned call to patient office out of Bildil and Edarbyclor samples.

## 2014-02-11 ENCOUNTER — Telehealth: Payer: Self-pay

## 2014-02-11 NOTE — Telephone Encounter (Signed)
Patient called for samples of bidil and edarbyclor placed samples up front

## 2014-02-16 ENCOUNTER — Telehealth: Payer: Self-pay | Admitting: Cardiology

## 2014-02-16 DIAGNOSIS — I2510 Atherosclerotic heart disease of native coronary artery without angina pectoris: Secondary | ICD-10-CM

## 2014-02-16 DIAGNOSIS — I10 Essential (primary) hypertension: Secondary | ICD-10-CM

## 2014-02-16 NOTE — Telephone Encounter (Signed)
Returned call to patient she stated she continues to have problem in which she has cold massive air sensation and dripping sensation.Stated she met someone with similar problem and they had carotid stenosis.Stated she has been doing research on computer and she would like to have a carotid doppler to make sure she don't have carotid stenosis.Dr.Jordan out of office will check with him next week and call her back.

## 2014-02-16 NOTE — Telephone Encounter (Signed)
New Prob   Pt is requesting to speak to nurse. Did not wish to leave any details.

## 2014-02-16 NOTE — Telephone Encounter (Signed)
Follow Up  Pt calling again regarding mess. from earlier. Please call.

## 2014-02-17 NOTE — Telephone Encounter (Signed)
Returned call to patient she stated she also wanted be to ask Dr.Jordan if she needs a chest xray.Will check with Dr.Jordan 02/24/14 and call her back.

## 2014-02-17 NOTE — Telephone Encounter (Signed)
Patient needs to speak with Malachy Mood regarding a chest x-ray.

## 2014-02-17 NOTE — Telephone Encounter (Signed)
Cheryl patient wants to speak to you

## 2014-02-24 ENCOUNTER — Telehealth: Payer: Self-pay | Admitting: *Deleted

## 2014-02-24 ENCOUNTER — Telehealth: Payer: Self-pay | Admitting: Cardiology

## 2014-02-24 NOTE — Telephone Encounter (Signed)
Returned call to patient Dr.Jordan in clinic will talk to him this afternoon after clinic about what we discussed last week.I will call you back this afternoon.Patient agreed.Stated she will be running a errand and will be back home after 4:00 pm.

## 2014-02-24 NOTE — Telephone Encounter (Signed)
Returned call to patient Dr.Jordan advised ok to order carotid doppler.Schedulers will call back tomorrow 02/25/14 to schedule.

## 2014-02-24 NOTE — Telephone Encounter (Signed)
Bidil samples placed at the front desk for patient.

## 2014-02-24 NOTE — Telephone Encounter (Signed)
Please call,she said you were suppose to call her concerning a carotid test.ud

## 2014-02-25 NOTE — Telephone Encounter (Signed)
Returned call to patient 02/24/14 Dr.Jordan advised ok to have carotid dopplers.Schedulers will call to schedule.

## 2014-03-01 ENCOUNTER — Ambulatory Visit (HOSPITAL_COMMUNITY)
Admission: RE | Admit: 2014-03-01 | Discharge: 2014-03-01 | Disposition: A | Discharge status: Home / Self Care | Payer: Medicare Other | Source: Ambulatory Visit | Attending: Cardiology | Admitting: Cardiology

## 2014-03-01 ENCOUNTER — Ambulatory Visit: Payer: Medicare Other | Admitting: Cardiology

## 2014-03-01 ENCOUNTER — Telehealth: Payer: Self-pay

## 2014-03-01 DIAGNOSIS — I10 Essential (primary) hypertension: Secondary | ICD-10-CM | POA: Diagnosis present

## 2014-03-01 DIAGNOSIS — I2510 Atherosclerotic heart disease of native coronary artery without angina pectoris: Secondary | ICD-10-CM | POA: Insufficient documentation

## 2014-03-01 DIAGNOSIS — R0989 Other specified symptoms and signs involving the circulatory and respiratory systems: Secondary | ICD-10-CM

## 2014-03-01 NOTE — Telephone Encounter (Signed)
Bidil 20/37.5 mg samples left at front desk [facility] office.

## 2014-03-01 NOTE — Progress Notes (Signed)
Carotid Duplex Completed. Elsa Ploch, BS, RDMS, RVT  

## 2014-03-05 ENCOUNTER — Telehealth: Payer: Self-pay | Admitting: Cardiology

## 2014-03-05 NOTE — Telephone Encounter (Signed)
Returned call to patient carotid doppler results given.Stated she continues to have massive air sensation.Stated she gets cold inside.Stated something is wrong.Advised to see PCP Dr.Tammy Luciana Axe.Copy of carotid dopplers faxed to [facility] at fax # [phone].

## 2014-03-05 NOTE — Telephone Encounter (Signed)
She said you were suppose to call her with her doppler results today. If you have them would you please call befioe 12 today.If not call after 3 today please.

## 2014-03-12 ENCOUNTER — Other Ambulatory Visit: Payer: Self-pay | Admitting: Otolaryngology

## 2014-03-12 DIAGNOSIS — H9319 Tinnitus, unspecified ear: Secondary | ICD-10-CM

## 2014-03-22 ENCOUNTER — Telehealth: Payer: Self-pay | Admitting: Cardiology

## 2014-03-22 NOTE — Telephone Encounter (Signed)
Pt called in stating that she would like to speak to  [place] about an ongoing medication. Please call  thanks

## 2014-03-22 NOTE — Telephone Encounter (Signed)
Returned call to patient she stated she wanted Dr.Jordan to know she found out what is causing the cold air sensation and dripping sound she has been hearing.Stated she entered her symptoms on her computer.Stated she has pulsatile tendinitis.Stated she saw audiologist Dr.Dwight Redmond Baseman and he ordered MRI of her head and neck.Will let Dr.Jordan know.Samples of Bidil 20/37.5 left at front desk of our [facility] office.

## 2014-03-25 ENCOUNTER — Ambulatory Visit
Admission: RE | Admit: 2014-03-25 | Discharge: 2014-03-25 | Disposition: A | Discharge status: Home / Self Care | Payer: Medicare Other | Source: Ambulatory Visit | Attending: Otolaryngology | Admitting: Otolaryngology

## 2014-03-25 DIAGNOSIS — H9319 Tinnitus, unspecified ear: Secondary | ICD-10-CM

## 2014-04-01 ENCOUNTER — Other Ambulatory Visit: Payer: Self-pay

## 2014-04-01 DIAGNOSIS — Z1231 Encounter for screening mammogram for malignant neoplasm of breast: Secondary | ICD-10-CM

## 2014-04-05 ENCOUNTER — Telehealth: Payer: Self-pay | Admitting: Cardiology

## 2014-04-05 NOTE — Telephone Encounter (Signed)
Please call her,said you knew her condition.

## 2014-04-06 NOTE — Telephone Encounter (Signed)
Returned call to patient 04/05/14.She stated she will be seeing a audiologist 04/13/14 for a restriction of a artery.Stated she will call back and let us know what he says.Also patient request samples of bildil and edarbyclor.[facility] office out of samples.Will check with our [facility] and see if they have samples.

## 2014-04-07 NOTE — Telephone Encounter (Signed)
Message copied by Audria Nine on Wed Apr 07, 2014  7:53 AM ------      Message from: Golden Hurter D      Created: Tue Apr 06, 2014 10:40 AM       Brynda Rim Pt wants to know if you have any samples of Edarbyclor 40/25 mg and Bildil.Let me know if you have any              Thanks Malachy Mood ------

## 2014-04-07 NOTE — Telephone Encounter (Signed)
Spoke to patient samples of bidil left at front desk of [employer].

## 2014-04-13 ENCOUNTER — Telehealth: Payer: Self-pay | Admitting: Cardiology

## 2014-04-13 NOTE — Telephone Encounter (Signed)
Pt called in stating that Malachy Mood wanted her in call her after her ENT doctor's appt. Please call she said it was urgent  Thanks

## 2014-04-13 NOTE — Telephone Encounter (Signed)
Returned call to patient she stated she saw [facility] Dr.Dwight Redmond Baseman today 04/13/14.Stated he will fax his report.Stated he could not find anything wrong.Stated her exam with him was normal.Stated he recommended follow up with Dr.Jordan.Stated he thinks her problem is coming from aorta artery.Stated his phone # [phone] fax # [phone].Advised to keep appointment with Dr.Jordan 04/16/14 at 10:00 am.

## 2014-04-16 ENCOUNTER — Ambulatory Visit: Payer: Medicare Other | Admitting: Cardiology

## 2014-04-16 ENCOUNTER — Ambulatory Visit (INDEPENDENT_AMBULATORY_CARE_PROVIDER_SITE_OTHER): Payer: Medicare Other | Admitting: Cardiology

## 2014-04-16 ENCOUNTER — Encounter: Payer: Self-pay | Admitting: Cardiology

## 2014-04-16 VITALS — BP 190/60 | HR 68 | Ht 62.0 in | Wt 230.0 lb

## 2014-04-16 DIAGNOSIS — N183 Chronic kidney disease, stage 3 unspecified: Secondary | ICD-10-CM

## 2014-04-16 DIAGNOSIS — R0989 Other specified symptoms and signs involving the circulatory and respiratory systems: Secondary | ICD-10-CM | POA: Insufficient documentation

## 2014-04-16 DIAGNOSIS — I739 Peripheral vascular disease, unspecified: Secondary | ICD-10-CM

## 2014-04-16 DIAGNOSIS — I700 Atherosclerosis of aorta: Secondary | ICD-10-CM

## 2014-04-16 DIAGNOSIS — Z1231 Encounter for screening mammogram for malignant neoplasm of breast: Secondary | ICD-10-CM

## 2014-04-16 DIAGNOSIS — Z794 Long term (current) use of insulin: Secondary | ICD-10-CM

## 2014-04-16 DIAGNOSIS — H93A1 Pulsatile tinnitus, right ear: Secondary | ICD-10-CM | POA: Insufficient documentation

## 2014-04-16 DIAGNOSIS — I25810 Atherosclerosis of coronary artery bypass graft(s) without angina pectoris: Secondary | ICD-10-CM

## 2014-04-16 DIAGNOSIS — E119 Type 2 diabetes mellitus without complications: Secondary | ICD-10-CM

## 2014-04-16 DIAGNOSIS — Z9889 Other specified postprocedural states: Secondary | ICD-10-CM

## 2014-04-16 DIAGNOSIS — H9311 Tinnitus, right ear: Secondary | ICD-10-CM

## 2014-04-16 DIAGNOSIS — Z95828 Presence of other vascular implants and grafts: Secondary | ICD-10-CM

## 2014-04-16 DIAGNOSIS — R609 Edema, unspecified: Secondary | ICD-10-CM

## 2014-04-16 DIAGNOSIS — I150 Renovascular hypertension: Secondary | ICD-10-CM

## 2014-04-16 LAB — CBC WITH DIFFERENTIAL/PLATELET
BASOS PCT: 0 % (ref 0–1)
Basophils Absolute: 0 10*3/uL (ref 0.0–0.1)
EOS ABS: 0.2 10*3/uL (ref 0.0–0.7)
EOS PCT: 3 % (ref 0–5)
HCT: 31.1 % — ABNORMAL LOW (ref 36.0–46.0)
Hemoglobin: 10 g/dL — ABNORMAL LOW (ref 12.0–15.0)
LYMPHS ABS: 1.5 10*3/uL (ref 0.7–4.0)
Lymphocytes Relative: 25 % (ref 12–46)
MCH: 28 pg (ref 26.0–34.0)
MCHC: 32.2 g/dL (ref 30.0–36.0)
MCV: 87.1 fL (ref 78.0–100.0)
Monocytes Absolute: 0.5 10*3/uL (ref 0.1–1.0)
Monocytes Relative: 8 % (ref 3–12)
NEUTROS PCT: 64 % (ref 43–77)
Neutro Abs: 3.7 10*3/uL (ref 1.7–7.7)
Platelets: 275 10*3/uL (ref 150–400)
RBC: 3.57 MIL/uL — ABNORMAL LOW (ref 3.87–5.11)
RDW: 14.9 % (ref 11.5–15.5)
WBC: 5.8 10*3/uL (ref 4.0–10.5)

## 2014-04-16 NOTE — Patient Instructions (Signed)
We will check blood work today.  We will schedule you for a CT angiogram of the aorta. We will pretreat you for contrast allergy.  I will see you after these studies.

## 2014-04-17 LAB — HEPATIC FUNCTION PANEL
ALBUMIN: 3.4 g/dL — AB (ref 3.5–5.2)
ALK PHOS: 83 U/L (ref 39–117)
ALT: 21 U/L (ref 0–35)
AST: 25 U/L (ref 0–37)
BILIRUBIN TOTAL: 0.5 mg/dL (ref 0.2–1.2)
Bilirubin, Direct: 0.1 mg/dL (ref 0.0–0.3)
Indirect Bilirubin: 0.4 mg/dL (ref 0.2–1.2)
TOTAL PROTEIN: 6.3 g/dL (ref 6.0–8.3)

## 2014-04-17 LAB — BASIC METABOLIC PANEL
BUN: 44 mg/dL — ABNORMAL HIGH (ref 6–23)
CO2: 22 mEq/L (ref 19–32)
Calcium: 8.9 mg/dL (ref 8.4–10.5)
Chloride: 112 mEq/L (ref 96–112)
Creat: 1.32 mg/dL — ABNORMAL HIGH (ref 0.50–1.10)
GLUCOSE: 95 mg/dL (ref 70–99)
POTASSIUM: 4.7 meq/L (ref 3.5–5.3)
Sodium: 143 mEq/L (ref 135–145)

## 2014-04-17 LAB — LIPID PANEL
CHOL/HDL RATIO: 3.6 ratio
CHOLESTEROL: 189 mg/dL (ref 0–200)
HDL: 52 mg/dL (ref >39)
LDL Cholesterol: 113 mg/dL — ABNORMAL HIGH (ref 0–99)
Triglycerides: 121 mg/dL (ref <150)
VLDL: 24 mg/dL (ref 0–40)

## 2014-04-17 LAB — TSH: TSH: 2.193 u[IU]/mL (ref 0.350–4.500)

## 2014-04-18 ENCOUNTER — Encounter: Payer: Self-pay | Admitting: Cardiology

## 2014-04-18 DIAGNOSIS — I700 Atherosclerosis of aorta: Secondary | ICD-10-CM | POA: Insufficient documentation

## 2014-04-18 DIAGNOSIS — N183 Chronic kidney disease, stage 3 unspecified: Secondary | ICD-10-CM

## 2014-04-18 DIAGNOSIS — N184 Chronic kidney disease, stage 4 (severe): Secondary | ICD-10-CM | POA: Insufficient documentation

## 2014-04-18 HISTORY — DX: Chronic kidney disease, stage 3 unspecified: N18.30

## 2014-04-18 NOTE — Progress Notes (Signed)
Norma [name] Date of Birth: June 27, 1946 Medical Record [id number]  History of Present Illness: Norma [name] comes back today for followup. She has a hsitory of  complex vascular surgery in November of 2012 including aorto-bifemoral bypass for aortic occlusive disease. She had CABG back in June of 2011. She has chronic swelling of her legs from venous insufficiency.  Echo in Feb. 2014  showed a normal EF and moderate PHTN and grade 1 diastolic dysfunction.. She has had some asymptomatic bradycardia - she remains on a lower dose of her Coreg to 12.5 mg BID.   She has very resistant HTN. She reports compliance with her medication.  She is tolerating her medications well. She continues to wear support hose and takes lasix daily. She avoids salt.  More recently she complains of pulsatile tinnitus. She was seen by ENT who felt that this was vascular in nature. Non contrast MRI of the head and neck was unremarkable. Carotid dopplers in august showed no obstructive disease. She reports she can hear a sound in her right ear with each heart beat and it keeps her awake. She also feels occasional sensation of a cold mass of air going through her entire body. She has complained of a dripping sensation in her body before. She states her breathing is stable. No chest pain or increase edema. Weight is up 5 lbs.  Current Outpatient Prescriptions  Medication Sig Dispense Refill  . acetaminophen (TYLENOL) 325 MG tablet Take 325-650 mg by mouth every 6 (six) hours as needed. For pain      . aspirin 325 MG tablet Take 81 mg by mouth daily.       Marland Kitchen atorvastatin (LIPITOR) 20 MG tablet Take 20 mg by mouth daily.        . Azilsartan-Chlorthalidone (EDARBYCLOR) 40-25 MG TABS Take 1 tablet by mouth daily.      . carvedilol (COREG) 12.5 MG tablet Take 1 tablet (12.5 mg total) by mouth 2 (two) times daily.  180 tablet  3  . Cholecalciferol (VITAMIN D3) 2000 UNITS TABS Take 2,000 Units by mouth 1 day or 1 dose.      . cloNIDine  (CATAPRES) 0.1 MG tablet Take 1 tablet (0.1 mg total) by mouth 2 (two) times daily.  180 tablet  3  . Coenzyme Q10 200 MG capsule Take 200 mg by mouth daily.        . fish oil-omega-3 fatty acids 1000 MG capsule Take 1 g by mouth 2 (two) times daily.        . furosemide (LASIX) 40 MG tablet Take 40 mg by mouth as needed. Take as needed for feet swellling      . insulin glargine (LANTUS) 100 UNIT/ML injection Inject 16 Units into the skin 2 (two) times daily.       . isosorbide-hydrALAZINE (BIDIL) 20-37.5 MG per tablet Take 2 tablets by mouth 2 (two) times daily.      . Liraglutide (VICTOZA [place]) Inject 1.8 Units into the skin.      . magnesium aspartate (MAGINEX) 615 MG tablet Take 500 mg by mouth daily.       . Probiotic Product (PROBIOTIC DAILY PO) Take by mouth as needed.       No current facility-administered medications for this visit.    Allergies  Allergen Reactions  . Iohexol      Code: RASH, Desc: pt called 1 day post scanning stating that skin was red and "itching all over" some what better but still had  symptom.. instructed pt to take benadryl to relieve symptoms,per dr Alvester Chou.if any problems call back/mms, Onset Date: [id number]   . Sulfa Antibiotics     Past Medical History  Diagnosis Date  . Claudication   . Diabetes mellitus   . Hypertension   . Hyperlipidemia   . DJD (degenerative joint disease)   . PAD (peripheral artery disease)   . Coronary artery disease   . Leg pain   . Carotid artery occlusion   . Obesity   . DDD (degenerative disc disease)   . DJD (degenerative joint disease)   . Shortness of breath     exertion  . Anxiety   . Diabetic coma   . Edema     Past Surgical History  Procedure Laterality Date  . Cardiac catheterization  12/01/2009    EF 65%  . Removal of fibrous cyst from right breast  10+ YEARS  . Retinal detachment surgery  10+ YEARS    LEFT EYE  . Coronary artery bypass graft  12/08/2009    LIMA GRAFT TO THE DISTAL LAD AND SAPHENOUS VEIN  GRAFT TO THE OBTUSE MARGINAL VESSEL  . Transthoracic echocardiogram  12/01/2009    EF 60-65%  . Cardiovascular stress test  11/28/2009    EF 75%  . Aorta - bilateral femoral artery bypass graft  05/25/2011    Procedure: AORTA BIFEMORAL BYPASS GRAFT;  Surgeon: Mal Misty, MD;  Location: [facility] OR;  Service: Vascular;  Laterality: N/A;  . Pr vein bypass graft,aorto-fem-pop  05/25/11    History  Smoking status  . Former Smoker -- 20 years  . Types: Cigarettes  . Quit date: 07/10/1991  Smokeless tobacco  . Never Used    History  Alcohol Use No    Family History  Problem Relation Age of Onset  . Hypertension Mother   . Coronary artery disease Father   . Hypertension Sister   . Cirrhosis Brother   . Kidney disease Daughter     Review of Systems: The review of systems is per the HPI.  All other systems were reviewed and are negative.  Physical Exam: BP 190/60  Pulse 68  Ht 5\' 2"  (1.575 m)  Wt 230 lb (104.327 kg)  BMI 42.06 kg/m2 Patient is very pleasant and in no acute distress. She is obese. Skin is warm and dry. Color is normal.  HEENT is unremarkable. Normocephalic/atraumatic. PERRL. Sclera are nonicteric. Neck is supple. No masses. No JVD. There are bilateral loud bruits R>L at the base of the neck and subclavian areas. Lungs are clear. Cardiac exam shows a regular rate and rhythm. Normal S1-2, no gallop or cardiac murmur. Abdomen is soft. NT, BS +. No masses. Extremities are with 1-2+ edema. Gait and ROM are intact. No gross neurologic deficits noted.  LABORATORY DATA:   Lab Results  Component Value Date   WBC 5.8 04/16/2014   HGB 10.0* 04/16/2014   HCT 31.1* 04/16/2014   PLT 275 04/16/2014   GLUCOSE 95 04/16/2014   CHOL 189 04/16/2014   TRIG 121 04/16/2014   HDL 52 04/16/2014   LDLCALC 113* 04/16/2014   ALT 21 04/16/2014   AST 25 04/16/2014   NA 143 04/16/2014   K 4.7 04/16/2014   CL 112 04/16/2014   CREATININE 1.32* 04/16/2014   BUN 44* 04/16/2014   CO2 22 04/16/2014    TSH 2.193 04/16/2014   INR 1.20 05/28/2011   HGBA1C 6.1* 06/13/2011   Prior renal duplex study 04/23/13 demonstrated normal abdominal aortic  caliber. The kidneys were normal in size bilaterally. There is no evidence of hydronephrosis. Velocities were elevated bilaterally however renal /aortic ratios were normal. CLINICAL DATA: 68 year old female presenting with of pulsatile  tinnitus for the past 6 months. History of diabetes, high blood  pressure and hyperlipidemia. History of coronary artery bypass,  aortic by femoral graft placement and carotid artery occlusion.  Initial encounter.  EXAM:  MRA NECK WITHOUT CONTRAST  MRA HEAD WITHOUT CONTRAST  TECHNIQUE:  Multiplanar and multiecho pulse sequences of the neck were obtained  without intravenous contrast. Angiographic images of the neck were  obtained using MRA technique without contast.; Angiographic images  of the Circle of Willis were obtained using MRA technique without  intravenous contrast.  COMPARISON: None.  FINDINGS:  MRA NECK FINDINGS  The present examination was performed without contrast as per  insurance authorization.  Mild narrowing proximal internal carotid arteries bilaterally.  Within the limitations of noncontrast imaging, no hemodynamically  significant stenosis of either proximal internal carotid artery is  noted.  Codominant vertebral arteries without high-grade stenosis.  Evaluation of the proximal aspect of the great vessels, vertebral  arteries and common carotid arteries is limited.  MRA HEAD FINDINGS  Fusiform ectasia with areas of mild to moderate narrowing involving  the distal vertical cervical segment of the internal carotid artery  and the petrous segment of the internal carotid artery bilaterally.  Mild to moderate narrowing of the pre cavernous and cavernous  segment of the internal carotid artery greater on the left.  No significant stenosis of either carotid terminus.  Mild narrowing proximal M1  segment right middle cerebral artery.  Mild to moderate narrowing involving portions of the M2 segments of  the middle cerebral arteries bilaterally.  Mild narrowing proximal aspect left anterior cerebral artery.  Mild irregularity and narrowing of portions of the distal vertebral  arteries without high-grade stenosis on either side.  Nonvisualized left posterior inferior cerebellar artery.  Ectatic basilar artery without high-grade stenosis.  Mild to slightly moderate narrowing distal posterior cerebral artery  branches bilaterally.  No aneurysm or vascular abnormality noted.  Limited evaluation of intracranial structures. MR brain not  performed or available for review.  IMPRESSION:  MRA NECK:  Mild narrowing proximal internal carotid arteries bilaterally.  Within the limitations of noncontrast imaging, no hemodynamically  significant stenosis of either proximal internal carotid artery is  noted.  Codominant vertebral arteries without high-grade stenosis.  Evaluation of the proximal aspect of the great vessels, proximal  vertebral arteries and common carotid arteries is limited by  artifact.  MRA HEAD FINDINGS  Atherosclerotic type changes most notable involving the internal  carotid arteries cavernous, pre cavernous, petrous and distal  vertical cervical segment as detailed above.  No aneurysm or vascular malformation noted.  Electronically Signed  By: Chauncey Cruel M.D.  On: 03/25/2014 13:41  Ecg: NSR rate 58 bpm. Nonspecific TWA  Assessment / Plan: 1. HTN - blood pressure control is resistant to medical treatment.  I have made no changes today.  When she had her complex vascular surgery in 2012 she had significant endarterectomy of the aorta above the renal arteries and into the origin of the renal arteries bilaterally. Her renal duplex study is somewhat equivocal. She clearly has advance aortic pathology and now has new bruits at the base of the neck. I have recommended Aortic  angiography to evaluate further including the thoracic aorta to rule out obstruction of the aorta or origin of the great vessels and to reassess  renal revascularization. She is allergic to contrast so we will pretreat with steroids, Benadryl, and Pepcid. With CKD will hold Edarbichlor and lasix for her procedure. I will see her following these studies.   2. CAD - with past CABG - no symptoms reported.   3. PVD - with past aortobifem bypass in 2012 - by Dr. Kellie Simmering  4. Palpitations with intermittent racing. Event monitor was unremarkable.   5. Severe aortic atherosclerosis with obstruction   6. Anemia. Chronic. Follow up with primary care.

## 2014-04-20 ENCOUNTER — Other Ambulatory Visit: Payer: Self-pay

## 2014-04-20 ENCOUNTER — Telehealth: Payer: Self-pay

## 2014-04-20 ENCOUNTER — Ambulatory Visit
Admission: RE | Admit: 2014-04-20 | Discharge: 2014-04-20 | Disposition: A | Discharge status: Home / Self Care | Payer: Medicare Other | Source: Ambulatory Visit

## 2014-04-20 DIAGNOSIS — R0989 Other specified symptoms and signs involving the circulatory and respiratory systems: Secondary | ICD-10-CM

## 2014-04-20 DIAGNOSIS — I150 Renovascular hypertension: Secondary | ICD-10-CM

## 2014-04-20 DIAGNOSIS — E785 Hyperlipidemia, unspecified: Secondary | ICD-10-CM

## 2014-04-20 DIAGNOSIS — I700 Atherosclerosis of aorta: Secondary | ICD-10-CM

## 2014-04-20 MED ORDER — DIPHENHYDRAMINE HCL 25 MG PO CAPS: ORAL_CAPSULE | ORAL | Status: DC

## 2014-04-20 MED ORDER — FAMOTIDINE 20 MG PO TABS: ORAL_TABLET | ORAL | Status: DC

## 2014-04-20 MED ORDER — PREDNISONE 20 MG PO TABS: ORAL_TABLET | ORAL | Status: DC

## 2014-04-20 MED ORDER — ATORVASTATIN CALCIUM 80 MG PO TABS: 80.0000 mg | ORAL_TABLET | Freq: Every day | ORAL | Status: DC

## 2014-04-20 NOTE — Telephone Encounter (Signed)
Spoke to patient CT angio of aorta scheduled 04/23/14 at [facility].Dr.Jordan advised to hold furosemide and hold edarbyclor day of CT.Also will need prednisone 60 mg night before procedure and 60 mg morning of procedure.Take benadryl 25 mg four times a day day before procedure and take 25 mg morning of procedure.Take pepcid 20 mg daily day before and day after procedure.Follow up appointment with Dr.Jordan 06/01/14 at 11:15 pm/

## 2014-04-20 NOTE — Telephone Encounter (Signed)
Patient will have Bmet repeated 04/26/14 or 04/27/14.Lab order mailed to patient.

## 2014-04-22 ENCOUNTER — Telehealth: Payer: Self-pay | Admitting: Cardiology

## 2014-04-22 NOTE — Telephone Encounter (Signed)
Please call,concerning Norma [name].

## 2014-04-22 NOTE — Telephone Encounter (Signed)
Pt calling wanting to speak with Malachy Mood in regards to some directions she received from her earlier about a CT Angiogram. Please call back  Thanks

## 2014-04-22 NOTE — Telephone Encounter (Signed)
See previous 04/22/14 note.

## 2014-04-22 NOTE — Telephone Encounter (Signed)
Returned call to patient received call today from [place] in our insurance department.She stated patient's insurance denied CT angio chest.Stated she filed a appeal.Stated would be best to reschedule CT Angio chest until she has approved.CT Angio Chest rescheduled to Tuesday 04/27/14 at 3:30 pm at [place].Also spoke to [place] at [place] she stated patient will need to pick up their contrast dye allergy prescription per their radiologist request.Spoke to patient she understands and will pick up their prescription.Patient understands new appointment time.Also Dr.Jordan advised may take lasix 40 mg daily for swelling in lower legs and then when swelling improved take as needed.Advised she will need to hold lasix and edarbyclor day of CT angio chest.

## 2014-04-23 ENCOUNTER — Inpatient Hospital Stay: Admission: RE | Admit: 2014-04-23 | Payer: Medicare Other | Source: Ambulatory Visit

## 2014-04-26 ENCOUNTER — Telehealth: Payer: Self-pay

## 2014-04-26 ENCOUNTER — Telehealth: Payer: Self-pay | Admitting: Cardiology

## 2014-04-26 NOTE — Telephone Encounter (Signed)
Pt wants to know will she need another lab order when she goes back on Friday?

## 2014-04-26 NOTE — Telephone Encounter (Signed)
Returned call to patient advised lab order for bmet is still good.Advised to take order to [facility] lab this Fri 04/30/14.

## 2014-04-26 NOTE — Telephone Encounter (Signed)
Received call from patient she received lab order for Bmet in mail. Stated she was confused on when to have done.Advised since CT angio chest rescheduled to tomorrow 04/27/14 she will need to have done this Fri 04/30/14.

## 2014-04-27 ENCOUNTER — Ambulatory Visit
Admission: RE | Admit: 2014-04-27 | Discharge: 2014-04-27 | Disposition: A | Discharge status: Home / Self Care | Payer: Medicare Other | Source: Ambulatory Visit | Attending: Cardiology | Admitting: Cardiology

## 2014-04-27 DIAGNOSIS — I700 Atherosclerosis of aorta: Secondary | ICD-10-CM

## 2014-04-27 DIAGNOSIS — R0989 Other specified symptoms and signs involving the circulatory and respiratory systems: Secondary | ICD-10-CM

## 2014-04-27 MED ORDER — IOHEXOL 350 MG/ML SOLN
80.0000 mL | Freq: Once | INTRAVENOUS | Status: AC | PRN
  Administered 2014-04-27: 80 mL via INTRAVENOUS

## 2014-05-01 LAB — HEPATIC FUNCTION PANEL
ALBUMIN: 2.9 g/dL — AB (ref 3.5–5.2)
ALT: 15 U/L (ref 0–35)
AST: 19 U/L (ref 0–37)
Alkaline Phosphatase: 57 U/L (ref 39–117)
BILIRUBIN TOTAL: 0.7 mg/dL (ref 0.2–1.2)
Bilirubin, Direct: 0.2 mg/dL (ref 0.0–0.3)
Indirect Bilirubin: 0.5 mg/dL (ref 0.2–1.2)
Total Protein: 5.1 g/dL — ABNORMAL LOW (ref 6.0–8.3)

## 2014-05-01 LAB — BASIC METABOLIC PANEL
BUN: 85 mg/dL — AB (ref 6–23)
CO2: 19 mEq/L (ref 19–32)
Calcium: 7.9 mg/dL — ABNORMAL LOW (ref 8.4–10.5)
Chloride: 103 mEq/L (ref 96–112)
Creat: 3.57 mg/dL — ABNORMAL HIGH (ref 0.50–1.10)
GLUCOSE: 342 mg/dL — AB (ref 70–99)
Potassium: 4.6 mEq/L (ref 3.5–5.3)
SODIUM: 137 meq/L (ref 135–145)

## 2014-05-01 LAB — LIPID PANEL
CHOL/HDL RATIO: 4.8 ratio
Cholesterol: 138 mg/dL (ref 0–200)
HDL: 29 mg/dL — ABNORMAL LOW (ref >39)
LDL Cholesterol: 75 mg/dL (ref 0–99)
Triglycerides: 169 mg/dL — ABNORMAL HIGH (ref <150)
VLDL: 34 mg/dL (ref 0–40)

## 2014-05-03 ENCOUNTER — Other Ambulatory Visit: Payer: Self-pay

## 2014-05-03 ENCOUNTER — Telehealth: Payer: Self-pay | Admitting: Cardiology

## 2014-05-03 DIAGNOSIS — R7989 Other specified abnormal findings of blood chemistry: Secondary | ICD-10-CM

## 2014-05-03 DIAGNOSIS — R799 Abnormal finding of blood chemistry, unspecified: Secondary | ICD-10-CM

## 2014-05-03 LAB — BASIC METABOLIC PANEL
BUN: 85 mg/dL — AB (ref 6–23)
CALCIUM: 8.6 mg/dL (ref 8.4–10.5)
CO2: 21 meq/L (ref 19–32)
Chloride: 109 mEq/L (ref 96–112)
Creat: 3.1 mg/dL — ABNORMAL HIGH (ref 0.50–1.10)
GLUCOSE: 307 mg/dL — AB (ref 70–99)
Potassium: 3.9 mEq/L (ref 3.5–5.3)
SODIUM: 144 meq/L (ref 135–145)

## 2014-05-03 MED ORDER — AMLODIPINE BESYLATE 5 MG PO TABS: 5.0000 mg | ORAL_TABLET | Freq: Every day | ORAL | Status: DC

## 2014-05-03 NOTE — Telephone Encounter (Signed)
Pease call,she said she just finished talking to you.

## 2014-05-03 NOTE — Telephone Encounter (Signed)
Returned call to patient she stated she wanted recent lab,CT angio chest faxed to PCP Dr.Tammy Luciana Axe.

## 2014-05-11 ENCOUNTER — Telehealth: Payer: Self-pay | Admitting: Cardiology

## 2014-05-11 NOTE — Telephone Encounter (Signed)
Please call,having excessive fluid in her legs. Dr Martinique stop her Lasix,so she wants to know what can she do?

## 2014-05-11 NOTE — Telephone Encounter (Signed)
Spoke to patient She states she is developing fluid in legs since stopping lasix and Edabyclor per Dr Martinique orders She states she has been drinking a lot water since her conversation with  [place]. She states she has been wearing her  compression stockings.  She has not been weighing herself  Daily , no shortness of breathe, no change in sleeping pattern, Blood pressure today b/p 143/62 pulse 60. RN asked patient to go to lab tomorrow to have labs done , per last notation need in in 1 week BMP . Patient states she is suppose to go on NOV 10 per [employer] with Malachy Mood. Patient states she need to discuss with Malachy Mood only. RN informed patient will send info to Dr Martinique and [facility]  LPN

## 2014-05-12 NOTE — Telephone Encounter (Signed)
Returned call to patient she stated she is having swelling in both lower legs.Advised needs to have repeat bmet today.Stated she cannot go today will have done tomorrow 05/13/14.Advised will need to get results before Dr.Jordan can restart lasix.Recent lab work,chest ct,Dr.Jordan's office note faxed to PCP Armond Hang at fax # [phone].

## 2014-05-13 ENCOUNTER — Telehealth: Payer: Self-pay | Admitting: Physician Assistant

## 2014-05-13 LAB — BASIC METABOLIC PANEL
BUN: 38 mg/dL — ABNORMAL HIGH (ref 6–23)
CALCIUM: 8.5 mg/dL (ref 8.4–10.5)
CO2: 22 meq/L (ref 19–32)
CREATININE: 1.59 mg/dL — AB (ref 0.50–1.10)
Chloride: 107 mEq/L (ref 96–112)
Glucose, Bld: 232 mg/dL — ABNORMAL HIGH (ref 70–99)
Potassium: 5.2 mEq/L (ref 3.5–5.3)
Sodium: 138 mEq/L (ref 135–145)

## 2014-05-13 NOTE — Telephone Encounter (Signed)
Stat lab results called in from [facility]:  SCr 1.59, BUN 38, GLU 232.  Considerable improvement from prior.   Cassy Sprowl, PAC

## 2014-05-13 NOTE — Telephone Encounter (Signed)
Recent Chest CT,Dr.Jordan's last office visit,recent lab work faxed to PCP Dr.Tammy Luciana Axe 05/11/14.

## 2014-05-14 ENCOUNTER — Telehealth: Payer: Self-pay | Admitting: Cardiology

## 2014-05-14 NOTE — Telephone Encounter (Signed)
See previous 05/14/14 note.

## 2014-05-14 NOTE — Telephone Encounter (Signed)
Returned call to patient spoke to DOD Dr.Hochrein he advised bun and creat improved may restart lasix 40 mg as needed for swelling.Will ask Dr.Jordan next week about restarting edarbyclor.

## 2014-05-14 NOTE — Telephone Encounter (Signed)
Patient had lab work yesterday---needs results.  Elly Modena, LPN was going to check with Dr. Martinique about the patient restarting her Lasix.  She has not heard anything about that.

## 2014-05-17 ENCOUNTER — Telehealth: Payer: Self-pay | Admitting: Cardiology

## 2014-05-17 NOTE — Telephone Encounter (Signed)
Don't restart Edarbychlor yet. Will reassess when I see her on the 24th. Needs repeat BMET then.  Peter Martinique MD, [facility]

## 2014-05-17 NOTE — Telephone Encounter (Signed)
Returned call to patient Dr.Jordan advised do not restart Edarbychlor yet.Keep appointment with Dr.Jordan 06/01/14 and he will repeat bmet then.

## 2014-05-17 NOTE — Telephone Encounter (Signed)
Mrs. Norma [name] is calling to clarify her medications .Marland Kitchen  Spoke with Norma [name] this Morning . Please call

## 2014-05-17 NOTE — Telephone Encounter (Signed)
Returned call to patient she wanted to know if Dr.Jordan wanted her to restart Edarbyclor and stop Amlodipine.Message sent to Mona for advice.

## 2014-05-18 ENCOUNTER — Other Ambulatory Visit: Payer: Self-pay | Admitting: Cardiology

## 2014-05-18 MED ORDER — CLONIDINE HCL 0.1 MG PO TABS: 0.1000 mg | ORAL_TABLET | Freq: Two times a day (BID) | ORAL | Status: DC

## 2014-05-19 ENCOUNTER — Telehealth: Payer: Self-pay | Admitting: Cardiology

## 2014-05-19 DIAGNOSIS — Z79899 Other long term (current) drug therapy: Secondary | ICD-10-CM

## 2014-05-19 NOTE — Addendum Note (Signed)
Addended by: Fidel Levy on: 05/19/2014 02:11 PM   Modules accepted: Orders

## 2014-05-19 NOTE — Telephone Encounter (Signed)
I would like to check a BMET again to assess her renal function before starting Edarbychlor again. Lets check a BMET again and see.  Peter Martinique MD, [facility]

## 2014-05-19 NOTE — Telephone Encounter (Signed)
Please call,pt blood pressure is up high. She does not know what to do,the top number is over 200.

## 2014-05-19 NOTE — Telephone Encounter (Signed)
Patient will go tomorrow for labs.

## 2014-05-19 NOTE — Telephone Encounter (Signed)
BMET ordered. Patient aware to have labs - she will have them by end of this week.

## 2014-05-19 NOTE — Telephone Encounter (Signed)
Patient c/o high BP - systolic ranging from [id number]  She reports she informed Malachy Mood of this on Monday. She was instructed NOT to restart edarbyclor. She has been taking lasix 40mg  daily, since instructed it was OK to resume.   Informed her this message will be sent to Dr. Martinique and Malachy Mood and they will both be in the office tomorrow. She states she can wait until tomorrow to get an answer from them, if not today.   Routed to Dr. Martinique & Malachy Mood

## 2014-05-20 ENCOUNTER — Telehealth: Payer: Self-pay | Admitting: Cardiology

## 2014-05-20 ENCOUNTER — Encounter (HOSPITAL_COMMUNITY): Payer: Self-pay | Admitting: Emergency Medicine

## 2014-05-20 ENCOUNTER — Emergency Department (HOSPITAL_COMMUNITY)
Admission: EM | Admit: 2014-05-20 | Discharge: 2014-05-20 | Disposition: A | Discharge status: Home / Self Care | Payer: Medicare Other | Attending: Emergency Medicine | Admitting: Emergency Medicine

## 2014-05-20 DIAGNOSIS — N183 Chronic kidney disease, stage 3 (moderate): Secondary | ICD-10-CM | POA: Diagnosis not present

## 2014-05-20 DIAGNOSIS — E785 Hyperlipidemia, unspecified: Secondary | ICD-10-CM | POA: Insufficient documentation

## 2014-05-20 DIAGNOSIS — E119 Type 2 diabetes mellitus without complications: Secondary | ICD-10-CM | POA: Diagnosis not present

## 2014-05-20 DIAGNOSIS — Z7982 Long term (current) use of aspirin: Secondary | ICD-10-CM | POA: Insufficient documentation

## 2014-05-20 DIAGNOSIS — M1990 Unspecified osteoarthritis, unspecified site: Secondary | ICD-10-CM | POA: Diagnosis not present

## 2014-05-20 DIAGNOSIS — Z794 Long term (current) use of insulin: Secondary | ICD-10-CM | POA: Insufficient documentation

## 2014-05-20 DIAGNOSIS — D508 Other iron deficiency anemias: Secondary | ICD-10-CM | POA: Diagnosis not present

## 2014-05-20 DIAGNOSIS — Z8659 Personal history of other mental and behavioral disorders: Secondary | ICD-10-CM | POA: Diagnosis not present

## 2014-05-20 DIAGNOSIS — Z79899 Other long term (current) drug therapy: Secondary | ICD-10-CM | POA: Insufficient documentation

## 2014-05-20 DIAGNOSIS — E669 Obesity, unspecified: Secondary | ICD-10-CM | POA: Insufficient documentation

## 2014-05-20 DIAGNOSIS — I2510 Atherosclerotic heart disease of native coronary artery without angina pectoris: Secondary | ICD-10-CM | POA: Insufficient documentation

## 2014-05-20 DIAGNOSIS — I129 Hypertensive chronic kidney disease with stage 1 through stage 4 chronic kidney disease, or unspecified chronic kidney disease: Secondary | ICD-10-CM | POA: Insufficient documentation

## 2014-05-20 DIAGNOSIS — I10 Essential (primary) hypertension: Secondary | ICD-10-CM

## 2014-05-20 DIAGNOSIS — Z87891 Personal history of nicotine dependence: Secondary | ICD-10-CM | POA: Diagnosis not present

## 2014-05-20 LAB — COMPREHENSIVE METABOLIC PANEL
ALBUMIN: 2.8 g/dL — AB (ref 3.5–5.2)
ALT: 18 U/L (ref 0–35)
AST: 33 U/L (ref 0–37)
Alkaline Phosphatase: 91 U/L (ref 39–117)
Anion gap: 14 (ref 5–15)
BUN: 34 mg/dL — ABNORMAL HIGH (ref 6–23)
CALCIUM: 9 mg/dL (ref 8.4–10.5)
CO2: 22 meq/L (ref 19–32)
CREATININE: 1.47 mg/dL — AB (ref 0.50–1.10)
Chloride: 106 mEq/L (ref 96–112)
GFR calc Af Amer: 41 mL/min — ABNORMAL LOW (ref >90)
GFR, EST NON AFRICAN AMERICAN: 35 mL/min — AB (ref >90)
Glucose, Bld: 107 mg/dL — ABNORMAL HIGH (ref 70–99)
Potassium: 4.1 mEq/L (ref 3.7–5.3)
Sodium: 142 mEq/L (ref 137–147)
Total Bilirubin: 0.4 mg/dL (ref 0.3–1.2)
Total Protein: 6.3 g/dL (ref 6.0–8.3)

## 2014-05-20 LAB — CBC WITH DIFFERENTIAL/PLATELET
BASOS ABS: 0 10*3/uL (ref 0.0–0.1)
Basophils Relative: 1 % (ref 0–1)
Eosinophils Absolute: 0.3 10*3/uL (ref 0.0–0.7)
Eosinophils Relative: 5 % (ref 0–5)
HCT: 29.5 % — ABNORMAL LOW (ref 36.0–46.0)
HEMOGLOBIN: 9.3 g/dL — AB (ref 12.0–15.0)
LYMPHS PCT: 34 % (ref 12–46)
Lymphs Abs: 2 10*3/uL (ref 0.7–4.0)
MCH: 27.6 pg (ref 26.0–34.0)
MCHC: 31.5 g/dL (ref 30.0–36.0)
MCV: 87.5 fL (ref 78.0–100.0)
Monocytes Absolute: 0.5 10*3/uL (ref 0.1–1.0)
Monocytes Relative: 9 % (ref 3–12)
NEUTROS ABS: 3 10*3/uL (ref 1.7–7.7)
NEUTROS PCT: 51 % (ref 43–77)
Platelets: 193 10*3/uL (ref 150–400)
RBC: 3.37 MIL/uL — ABNORMAL LOW (ref 3.87–5.11)
RDW: 15.2 % (ref 11.5–15.5)
WBC: 5.8 10*3/uL (ref 4.0–10.5)

## 2014-05-20 LAB — TROPONIN I: Troponin I: <0.3 ng/mL (ref <0.30)

## 2014-05-20 LAB — PRO B NATRIURETIC PEPTIDE: Pro B Natriuretic peptide (BNP): 1286 pg/mL — ABNORMAL HIGH (ref 0–125)

## 2014-05-20 MED ORDER — HYDRALAZINE HCL 20 MG/ML IJ SOLN
10.0000 mg | Freq: Once | INTRAMUSCULAR | Status: AC
  Administered 2014-05-20: 10 mg via INTRAVENOUS
  Filled 2014-05-20: qty 1

## 2014-05-20 NOTE — ED Provider Notes (Signed)
CSN: [id number]     Arrival date & time 05/20/14  0058 History   First MD Initiated Contact with Patient 05/20/14 0122     Chief Complaint  Patient presents with  . Hypertension     (Consider location/radiation/quality/duration/timing/severity/associated sxs/prior Treatment) HPI Comments: Patient presents to the ER for evaluation of elevated blood pressure. Patient reports that she recently had to have her blood pressure medications changed because she had renal insufficiency after having IV diuretic CAT scan. Her cardiologist, Dr. Martinique has been monitoring this. She is scheduled to have repeat blood work performed tomorrow.  She reports that she was feeling slightly "fuzzy headed". She took her blood pressure and it was 230/68. She is not expressing any chest pain or shortness of breath. There is no vision change. No numbness, tingling or weakness in extremities.  Patient is a 68 y.o. female presenting with hypertension.  Hypertension Pertinent negatives include no chest pain, no headaches and no shortness of breath.    Past Medical History  Diagnosis Date  . Claudication   . Diabetes mellitus   . Hypertension   . Hyperlipidemia   . DJD (degenerative joint disease)   . PAD (peripheral artery disease)   . Coronary artery disease   . Leg pain   . Carotid artery occlusion   . Obesity   . DDD (degenerative disc disease)   . DJD (degenerative joint disease)   . Shortness of breath     exertion  . Anxiety   . Diabetic coma   . Edema   . CKD (chronic kidney disease) stage 3, GFR 30-59 ml/min 04/18/2014   Past Surgical History  Procedure Laterality Date  . Cardiac catheterization  12/01/2009    EF 65%  . Removal of fibrous cyst from right breast  10+ YEARS  . Retinal detachment surgery  10+ YEARS    LEFT EYE  . Coronary artery bypass graft  12/08/2009    LIMA GRAFT TO THE DISTAL LAD AND SAPHENOUS VEIN GRAFT TO THE OBTUSE MARGINAL VESSEL  . Transthoracic echocardiogram   12/01/2009    EF 60-65%  . Cardiovascular stress test  11/28/2009    EF 75%  . Aorta - bilateral femoral artery bypass graft  05/25/2011    Procedure: AORTA BIFEMORAL BYPASS GRAFT;  Surgeon: Mal Misty, MD;  Location: [facility];  Service: Vascular;  Laterality: N/A;  . Pr vein bypass graft,aorto-fem-pop  05/25/11   Family History  Problem Relation Age of Onset  . Hypertension Mother   . Coronary artery disease Father   . Hypertension Sister   . Cirrhosis Brother   . Kidney disease Daughter    History  Substance Use Topics  . Smoking status: Former Smoker -- 20 years    Types: Cigarettes    Quit date: 07/10/1991  . Smokeless tobacco: Never Used  . Alcohol Use: No   OB History    No data available     Review of Systems  Respiratory: Negative for shortness of breath.   Cardiovascular: Negative for chest pain.  Neurological: Negative for headaches.  All other systems reviewed and are negative.     Allergies  Iohexol; Sulfa antibiotics; and Sulfasalazine  Home Medications   Prior to Admission medications   Medication Sig Start Date End Date Taking? Authorizing Provider  acetaminophen (TYLENOL) 325 MG tablet Take 325-650 mg by mouth every 6 (six) hours as needed. For pain   Yes Historical Provider, MD  amLODipine (NORVASC) 5 MG tablet Take 1 tablet (  5 mg total) by mouth daily. 05/03/14  Yes Peter M Martinique, MD  aspirin 325 MG tablet Take 325 mg by mouth daily.    Yes Historical Provider, MD  atorvastatin (LIPITOR) 80 MG tablet Take 1 tablet (80 mg total) by mouth daily. 04/20/14  Yes Peter M Martinique, MD  carvedilol (COREG) 12.5 MG tablet Take 1 tablet (12.5 mg total) by mouth 2 (two) times daily. 09/23/13  Yes Peter M Martinique, MD  Cholecalciferol (VITAMIN D3) 2000 UNITS TABS Take 2,000 Units by mouth 1 day or 1 dose.   Yes Historical Provider, MD  cloNIDine (CATAPRES) 0.1 MG tablet Take 1 tablet (0.1 mg total) by mouth 2 (two) times daily. 05/18/14  Yes Peter M Martinique, MD   Coenzyme Q10 200 MG capsule Take 200 mg by mouth daily.     Yes Historical Provider, MD  furosemide (LASIX) 40 MG tablet Take 40 mg by mouth daily. Take as needed for feet swellling   Yes Historical Provider, MD  insulin glargine (LANTUS) 100 UNIT/ML injection Inject 16 Units into the skin 2 (two) times daily.  09/20/11  Yes Domenic Polite, MD  isosorbide-hydrALAZINE (BIDIL) 20-37.5 MG per tablet Take 2 tablets by mouth 3 (three) times daily.    Yes Historical Provider, MD  Liraglutide (VICTOZA [place]) Inject 1.8 Units into the skin.   Yes Historical Provider, MD  magnesium aspartate (MAGINEX) 615 MG tablet Take 500 mg by mouth daily.    Yes Historical Provider, MD  Probiotic Product (PROBIOTIC DAILY PO) Take 1 tablet by mouth daily.    Yes Historical Provider, MD  Azilsartan-Chlorthalidone (EDARBYCLOR) 40-25 MG TABS Take 1 tablet by mouth daily.    Historical Provider, MD  diphenhydrAMINE (BENADRYL) 25 mg capsule Take 25 mg four times a day day before CT and take 25 mg morning of CT 04/20/14   Peter M Martinique, MD  famotidine (PEPCID) 20 MG tablet Take 20 mg daily day before CT and 20 mg morning of CT 04/20/14   Peter M Martinique, MD  fish oil-omega-3 fatty acids 1000 MG capsule Take 1 g by mouth 2 (two) times daily.      Historical Provider, MD  predniSONE (DELTASONE) 20 MG tablet Take 60 mg ( 3 tablets ) night before CT and 60 mg ( 3 tablets ) morning of CT 04/20/14   Peter M Martinique, MD   BP 174/35 mmHg  Pulse 45  Temp(Src) 98.2 F (36.8 C) (Oral)  Resp 20  Ht 5\' 2"  (1.575 m)  Wt 230 lb (104.327 kg)  BMI 42.06 kg/m2  SpO2 97% Physical Exam  Constitutional: She is oriented to person, place, and time. She appears well-developed and well-nourished. No distress.  HENT:  Head: Normocephalic and atraumatic.  Right Ear: Hearing normal.  Left Ear: Hearing normal.  Nose: Nose normal.  Mouth/Throat: Oropharynx is clear and moist and mucous membranes are normal.  Eyes: Conjunctivae and EOM are normal.  Pupils are equal, round, and reactive to light.  Neck: Normal range of motion. Neck supple.  Cardiovascular: Regular rhythm, S1 normal and S2 normal.  Exam reveals no gallop and no friction rub.   No murmur heard. Pulmonary/Chest: Effort normal and breath sounds normal. No respiratory distress. She exhibits no tenderness.  Abdominal: Soft. Normal appearance and bowel sounds are normal. There is no hepatosplenomegaly. There is no tenderness. There is no rebound, no guarding, no tenderness at McBurney's point and negative Murphy's sign. No hernia.  Musculoskeletal: Normal range of motion.  Neurological: She is  alert and oriented to person, place, and time. She has normal strength. No cranial nerve deficit or sensory deficit. Coordination normal. GCS eye subscore is 4. GCS verbal subscore is 5. GCS motor subscore is 6.  Extraocular muscle movement: normal No visual field cut Pupils: equal and reactive both direct and consensual response is normal No nystagmus present    Sensory function is intact to light touch, pinprick Proprioception intact  Grip strength 5/5 symmetric in upper extremities Lower extremity strength 5/5 against gravity No pronator drift Normal finger to nose bilaterally Normal heel to shin bilaterally  Gait: normal    Skin: Skin is warm, dry and intact. No rash noted. No cyanosis.  Psychiatric: She has a normal mood and affect. Her speech is normal and behavior is normal. Thought content normal.  Nursing note and vitals reviewed.   ED Course  Procedures (including critical care time) Labs Review Labs Reviewed  CBC WITH DIFFERENTIAL - Abnormal; Notable for the following:    RBC 3.37 (*)    Hemoglobin 9.3 (*)    HCT 29.5 (*)    All other components within normal limits  PRO B NATRIURETIC PEPTIDE - Abnormal; Notable for the following:    Pro B Natriuretic peptide (BNP) 1286.0 (*)    All other components within normal limits  COMPREHENSIVE METABOLIC PANEL -  Abnormal; Notable for the following:    Glucose, Bld 107 (*)    BUN 34 (*)    Creatinine, Ser 1.47 (*)    Albumin 2.8 (*)    GFR calc non Af Amer 35 (*)    GFR calc Af Amer 41 (*)    All other components within normal limits  TROPONIN I    Imaging Review No results found.   EKG Interpretation None      MDM   Final diagnoses:  None  hypertension Anemia  Patient presents to the ER for evaluation of elevated blood pressure. Patient does have a history of chronic hypertension, has recently had blood pressure medication changes. Patient had renal insufficiency after IV dye for a CAT scan. Creatinine was a 3.5 after the study. Today's creatinine, however, was 1.47, significantly improved.  Patient had a normal neurologic exam. She denies any focal neurologic symptoms or findings. She did not require CT scan or imaging. Patient's blood pressure was elevated on arrival, this improved with IV hydralazine. Recheck reveals that she has continued to do well, no neurologic symptoms.  Remainder of the blood work for anemia. Patient has had a slow drop in her hemoglobin recently. This has already been noted by her primary doctor and she has follow-up with gastroenterology tomorrow to be worked up for anemia and blood loss. Her appointment is at 11 AM. She is therefore appropriate for discharge to follow-up with her cardiologist for repeat blood pressure check and gastroenterology for evaluation of anemia.    Orpah Greek, MD 05/20/14 [phone]

## 2014-05-20 NOTE — Discharge Instructions (Signed)
Anemia, Nonspecific Anemia is a condition in which the concentration of red blood cells or hemoglobin in the blood is below normal. Hemoglobin is a substance in red blood cells that carries oxygen to the tissues of the body. Anemia results in not enough oxygen reaching these tissues.  CAUSES  Common causes of anemia include:   Excessive bleeding. Bleeding may be internal or external. This includes excessive bleeding from periods (in women) or from the intestine.   Poor nutrition.   Chronic kidney, thyroid, and liver disease.  Bone marrow disorders that decrease red blood cell production.  Cancer and treatments for cancer.  HIV, AIDS, and their treatments.  Spleen problems that increase red blood cell destruction.  Blood disorders.  Excess destruction of red blood cells due to infection, medicines, and autoimmune disorders. SIGNS AND SYMPTOMS   Minor weakness.   Dizziness.   Headache.  Palpitations.   Shortness of breath, especially with exercise.   Paleness.  Cold sensitivity.  Indigestion.  Nausea.  Difficulty sleeping.  Difficulty concentrating. Symptoms may occur suddenly or they may develop slowly.  DIAGNOSIS  Additional blood tests are often needed. These help your health care provider determine the best treatment. Your health care provider will check your stool for blood and look for other causes of blood loss.  TREATMENT  Treatment varies depending on the cause of the anemia. Treatment can include:   Supplements of iron, vitamin 123456, or folic acid.   Hormone medicines.   A blood transfusion. This may be needed if blood loss is severe.   Hospitalization. This may be needed if there is significant continual blood loss.   Dietary changes.  Spleen removal. HOME CARE INSTRUCTIONS Keep all follow-up appointments. It often takes many weeks to correct anemia, and having your health care provider check on your condition and your response to  treatment is very important. SEEK IMMEDIATE MEDICAL CARE IF:   You develop extreme weakness, shortness of breath, or chest pain.   You become dizzy or have trouble concentrating.  You develop heavy vaginal bleeding.   You develop a rash.   You have bloody or black, tarry stools.   You faint.   You vomit up blood.   You vomit repeatedly.   You have abdominal pain.  You have a fever or persistent symptoms for more than 2-3 days.   You have a fever and your symptoms suddenly get worse.   You are dehydrated.  MAKE SURE YOU:  Understand these instructions.  Will watch your condition.  Will get help right away if you are not doing well or get worse. Document Released: 08/02/2004 Document Revised: 02/25/2013 Document Reviewed: 12/19/2012 [facility] Patient Information 2015 [place], [place]. This information is not intended to replace advice given to you by your health care provider. Make sure you discuss any questions you have with your health care provider.  Hypertension Hypertension, commonly called high blood pressure, is when the force of blood pumping through your arteries is too strong. Your arteries are the blood vessels that carry blood from your heart throughout your body. A blood pressure reading consists of a higher number over a lower number, such as 110/72. The higher number (systolic) is the pressure inside your arteries when your heart pumps. The lower number (diastolic) is the pressure inside your arteries when your heart relaxes. Ideally you want your blood pressure below 120/80. Hypertension forces your heart to work harder to pump blood. Your arteries may become narrow or stiff. Having hypertension puts you  at risk for heart disease, stroke, and other problems.  RISK FACTORS Some risk factors for high blood pressure are controllable. Others are not.  Risk factors you cannot control include:   Race. You may be at higher risk if you are African  American.  Age. Risk increases with age.  Gender. Men are at higher risk than women before age 73 years. After age 98, women are at higher risk than men. Risk factors you can control include:  Not getting enough exercise or physical activity.  Being overweight.  Getting too much fat, sugar, calories, or salt in your diet.  Drinking too much alcohol. SIGNS AND SYMPTOMS Hypertension does not usually cause signs or symptoms. Extremely high blood pressure (hypertensive crisis) may cause headache, anxiety, shortness of breath, and nosebleed. DIAGNOSIS  To check if you have hypertension, your health care provider will measure your blood pressure while you are seated, with your arm held at the level of your heart. It should be measured at least twice using the same arm. Certain conditions can cause a difference in blood pressure between your right and left arms. A blood pressure reading that is higher than normal on one occasion does not mean that you need treatment. If one blood pressure reading is high, ask your health care provider about having it checked again. TREATMENT  Treating high blood pressure includes making lifestyle changes and possibly taking medicine. Living a healthy lifestyle can help lower high blood pressure. You may need to change some of your habits. Lifestyle changes may include:  Following the DASH diet. This diet is high in fruits, vegetables, and whole grains. It is low in salt, red meat, and added sugars.  Getting at least 2 hours of brisk physical activity every week.  Losing weight if necessary.  Not smoking.  Limiting alcoholic beverages.  Learning ways to reduce stress. If lifestyle changes are not enough to get your blood pressure under control, your health care provider may prescribe medicine. You may need to take more than one. Work closely with your health care provider to understand the risks and benefits. HOME CARE INSTRUCTIONS  Have your blood  pressure rechecked as directed by your health care provider.   Take medicines only as directed by your health care provider. Follow the directions carefully. Blood pressure medicines must be taken as prescribed. The medicine does not work as well when you skip doses. Skipping doses also puts you at risk for problems.   Do not smoke.   Monitor your blood pressure at home as directed by your health care provider. SEEK MEDICAL CARE IF:   You think you are having a reaction to medicines taken.  You have recurrent headaches or feel dizzy.  You have swelling in your ankles.  You have trouble with your vision. SEEK IMMEDIATE MEDICAL CARE IF:  You develop a severe headache or confusion.  You have unusual weakness, numbness, or feel faint.  You have severe chest or abdominal pain.  You vomit repeatedly.  You have trouble breathing. MAKE SURE YOU:   Understand these instructions.  Will watch your condition.  Will get help right away if you are not doing well or get worse. Document Released: 06/25/2005 Document Revised: 11/09/2013 Document Reviewed: 04/17/2013 [facility] Patient Information 2015 [place], [place]. This information is not intended to replace advice given to you by your health care provider. Make sure you discuss any questions you have with your health care provider.

## 2014-05-20 NOTE — Telephone Encounter (Signed)
Please call asap,pt just left the ER this morning. Her blood was extremely high and they told her to call you this morning.

## 2014-05-20 NOTE — Telephone Encounter (Signed)
Returned call to patient she stated she had to go to [facility] ER last night due to elevated B/P.Spoke to [place] he advised may restart E

## 2014-05-20 NOTE — ED Notes (Signed)
Pt. reports elevated blood pressure this evening at home 230/68 , feels " fussy" , dizziness , denies chest pain / respirations unlabored .

## 2014-05-20 NOTE — Telephone Encounter (Signed)
Dr.Jordan advised restart Edarbyclor,stop amlodipine.Advised to keep appointment with Dr.Jordan 06/01/14 at 2:15 pm.

## 2014-05-22 ENCOUNTER — Emergency Department (HOSPITAL_COMMUNITY)
Admission: EM | Admit: 2014-05-22 | Discharge: 2014-05-22 | Disposition: A | Discharge status: Home / Self Care | Payer: Medicare Other | Attending: Emergency Medicine | Admitting: Emergency Medicine

## 2014-05-22 ENCOUNTER — Encounter (HOSPITAL_COMMUNITY): Payer: Self-pay

## 2014-05-22 DIAGNOSIS — Z8739 Personal history of other diseases of the musculoskeletal system and connective tissue: Secondary | ICD-10-CM | POA: Diagnosis not present

## 2014-05-22 DIAGNOSIS — E669 Obesity, unspecified: Secondary | ICD-10-CM | POA: Diagnosis not present

## 2014-05-22 DIAGNOSIS — Z87891 Personal history of nicotine dependence: Secondary | ICD-10-CM | POA: Diagnosis not present

## 2014-05-22 DIAGNOSIS — R001 Bradycardia, unspecified: Secondary | ICD-10-CM | POA: Diagnosis not present

## 2014-05-22 DIAGNOSIS — Z79899 Other long term (current) drug therapy: Secondary | ICD-10-CM | POA: Insufficient documentation

## 2014-05-22 DIAGNOSIS — F419 Anxiety disorder, unspecified: Secondary | ICD-10-CM | POA: Diagnosis not present

## 2014-05-22 DIAGNOSIS — I2510 Atherosclerotic heart disease of native coronary artery without angina pectoris: Secondary | ICD-10-CM | POA: Diagnosis not present

## 2014-05-22 DIAGNOSIS — N2889 Other specified disorders of kidney and ureter: Secondary | ICD-10-CM

## 2014-05-22 DIAGNOSIS — E785 Hyperlipidemia, unspecified: Secondary | ICD-10-CM | POA: Diagnosis not present

## 2014-05-22 DIAGNOSIS — I151 Hypertension secondary to other renal disorders: Secondary | ICD-10-CM | POA: Insufficient documentation

## 2014-05-22 DIAGNOSIS — Z7982 Long term (current) use of aspirin: Secondary | ICD-10-CM | POA: Diagnosis not present

## 2014-05-22 DIAGNOSIS — D649 Anemia, unspecified: Secondary | ICD-10-CM | POA: Insufficient documentation

## 2014-05-22 DIAGNOSIS — Z951 Presence of aortocoronary bypass graft: Secondary | ICD-10-CM | POA: Diagnosis not present

## 2014-05-22 DIAGNOSIS — E119 Type 2 diabetes mellitus without complications: Secondary | ICD-10-CM | POA: Insufficient documentation

## 2014-05-22 DIAGNOSIS — I10 Essential (primary) hypertension: Secondary | ICD-10-CM

## 2014-05-22 DIAGNOSIS — Z794 Long term (current) use of insulin: Secondary | ICD-10-CM | POA: Insufficient documentation

## 2014-05-22 DIAGNOSIS — Z9889 Other specified postprocedural states: Secondary | ICD-10-CM | POA: Insufficient documentation

## 2014-05-22 DIAGNOSIS — N183 Chronic kidney disease, stage 3 (moderate): Secondary | ICD-10-CM | POA: Diagnosis present

## 2014-05-22 LAB — COMPREHENSIVE METABOLIC PANEL
ALK PHOS: 86 U/L (ref 39–117)
ALT: 14 U/L (ref 0–35)
AST: 18 U/L (ref 0–37)
Albumin: 2.8 g/dL — ABNORMAL LOW (ref 3.5–5.2)
Anion gap: 12 (ref 5–15)
BILIRUBIN TOTAL: 0.4 mg/dL (ref 0.3–1.2)
BUN: 36 mg/dL — ABNORMAL HIGH (ref 6–23)
CHLORIDE: 103 meq/L (ref 96–112)
CO2: 24 meq/L (ref 19–32)
CREATININE: 1.56 mg/dL — AB (ref 0.50–1.10)
Calcium: 8.8 mg/dL (ref 8.4–10.5)
GFR calc Af Amer: 38 mL/min — ABNORMAL LOW (ref >90)
GFR, EST NON AFRICAN AMERICAN: 33 mL/min — AB (ref >90)
Glucose, Bld: 224 mg/dL — ABNORMAL HIGH (ref 70–99)
Potassium: 3.7 mEq/L (ref 3.7–5.3)
Sodium: 139 mEq/L (ref 137–147)
Total Protein: 6.1 g/dL (ref 6.0–8.3)

## 2014-05-22 LAB — CBC
HCT: 27.4 % — ABNORMAL LOW (ref 36.0–46.0)
Hemoglobin: 8.7 g/dL — ABNORMAL LOW (ref 12.0–15.0)
MCH: 28.7 pg (ref 26.0–34.0)
MCHC: 31.8 g/dL (ref 30.0–36.0)
MCV: 90.4 fL (ref 78.0–100.0)
Platelets: 175 10*3/uL (ref 150–400)
RBC: 3.03 MIL/uL — AB (ref 3.87–5.11)
RDW: 15.6 % — ABNORMAL HIGH (ref 11.5–15.5)
WBC: 3.8 10*3/uL — AB (ref 4.0–10.5)

## 2014-05-22 LAB — PRO B NATRIURETIC PEPTIDE: Pro B Natriuretic peptide (BNP): 1132 pg/mL — ABNORMAL HIGH (ref 0–125)

## 2014-05-22 LAB — TROPONIN I: Troponin I: <0.3 ng/mL (ref <0.30)

## 2014-05-22 MED ORDER — HYDRALAZINE HCL 25 MG PO TABS: 25.0000 mg | ORAL_TABLET | Freq: Four times a day (QID) | ORAL | Status: DC | PRN

## 2014-05-22 NOTE — ED Notes (Signed)
Patient reports had a CT angio done but had an adverse reaction to the dye and her kidneys started to fail. States that her cardiologist took her off some of her BP medications but now her kidneys are improving. Was seen here Thursday for hypertension, given IV HTN meds and sent home. States BP continues to fluctuate, took at home and was [id number] systolic. Also reports edema to ankles/feet. Reports lightheadedness. Denies SOB, weakness. Alert and oriented x4.

## 2014-05-22 NOTE — Consult Note (Addendum)
CARDIOLOGY CONSULT NOTE   Patient ID: Norma [name] MRN: [id number], DOB/AGE: [id number]   Admit date: 05/22/2014 Date of Consult: 05/22/2014   Primary Physician: Bartholome Bill, MD Primary Cardiologist: Peter Martinique, MD  Pt. 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   Problem List  Past Medical History  Diagnosis Date  . Claudication   . Diabetes mellitus   . Hypertension   . Hyperlipidemia   . DJD (degenerative joint disease)   . PAD (peripheral artery disease)   . Coronary artery disease   . Leg pain   . Carotid artery occlusion   . Obesity   . DDD (degenerative disc disease)   . DJD (degenerative joint disease)   . Shortness of breath     exertion  . Anxiety   . Diabetic coma   . Edema   . CKD (chronic kidney disease) stage 3, GFR 30-59 ml/min 04/18/2014    Past Surgical History  Procedure Laterality Date  . Cardiac catheterization  12/01/2009    EF 65%  . Removal of fibrous cyst from right breast  10+ YEARS  . Retinal detachment surgery  10+ YEARS    LEFT EYE  . Coronary artery bypass graft  12/08/2009    LIMA GRAFT TO THE DISTAL LAD AND SAPHENOUS VEIN GRAFT TO THE OBTUSE MARGINAL VESSEL  . Transthoracic echocardiogram  12/01/2009    EF 60-65%  . Cardiovascular stress test  11/28/2009    EF 75%  . Aorta - bilateral femoral artery bypass graft  05/25/2011    Procedure: AORTA BIFEMORAL BYPASS GRAFT;  Surgeon: Mal Misty, MD;  Location: [facility] OR;  Service: Vascular;  Laterality: N/A;  . Pr vein bypass graft,aorto-fem-pop  05/25/11     Allergies  Allergies  Allergen Reactions  . Iohexol      Code: RASH, Desc: pt called 1 day post scanning stating that skin was red and "itching all  over" some what better but still had symptom.. instructed pt to take benadryl to relieve symptoms,per dr Alvester Chou.if any problems call back/mms, Onset Date: [id number]   . 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   Norma [name] underwent CT angiography on 04/27/14 for evaluation of new neck bruits and resistant hypertension in the setting of having a significant endarterectomy of the aorta above the renal arteries and into the origin of the renal arteries bilaterally.  The scan demonstrated atherosclerosis but no significant stenosis. She unfortunately had contrast nephropathy, peak Cr 3.57 (04/30/14), and her lasix and Edarbyclor were stopped in this setting. The lasix was restarted on 05/14/14.  She checks her BP and home and had an ED (05/20/14) visit for feeling "fuzzy headed". She took her blood pressure and it was 230/68 and came to the ED for evaluation. She was given IV hydralazine with improvement in her BP and she was discharged. Two days ago her Norma [name] was restarted. Yesterday her PCP increased  her clonidine from 0.1mg  BID to 0.2mg  BID. With these changes, her regimen was Edarbyclor, lasix 40mg , bidil 20/37.5 BID, clonidine 0.2 BID, coreg 12.5mg  BID.  Aside from the increased dose of clonidine, this was the exact regiment he patient reported she was on before the contrast nephropathy. She notes that her SBPs were typically between the 130s-180s.    Today she reports feeling dizzy, weak, and with some blurry vision. She checked her BP and found the systolic pressures to be [id number], 223, and 189. For that reason, she came to the ED.  She denied chest pain, chest pressure, dyspnea, syncope,  presyncope. Weight has been stable, although she reports she is bothered by her LE edema.   In ED, BP was 168/96, HR 42bpm. labs were K 3.7, Cr 1.56, BNP 1132. ECG demonstrated SB in the low 40s. She felt completely back to baseline her entire time in the ED. SBPs remained in the 160s the entire time.   Inpatient Medications    Family History Family History  Problem Relation Age of Onset  . Hypertension Mother   . Coronary artery disease Father   . Hypertension Sister   . Cirrhosis Brother   . Kidney disease Daughter      Social History History   Social History  . Marital Status: Single    Spouse Name: N/A    Number of Children: 2  . Years of Education: N/A   Occupational History  . [place]     retired   Social History Main Topics  . Smoking status: Former Smoker -- 20 years    Types: Cigarettes    Quit date: 07/10/1991  . Smokeless tobacco: Never Used  . Alcohol Use: No  . Drug Use: No  . Sexual Activity: No   Other Topics Concern  . Not on file   Social History Narrative     Review of Systems  General:  No chills, fever, night sweats or weight changes.  Cardiovascular:  No chest pain, dyspnea on exertion, orthopnea, palpitations, paroxysmal nocturnal dyspnea. + edema Dermatological: No rash, lesions/masses Respiratory: No cough, dyspnea Urologic: No hematuria, dysuria Abdominal:   No nausea, vomiting, diarrhea, bright red blood per rectum, melena, or hematemesis Neurologic:  No wkns, changes in mental status. All other systems reviewed and are otherwise negative except as noted above.  Physical Exam  Blood pressure 160/42, pulse 47, temperature 98 F (36.7 C), temperature source Oral, resp. rate 18, SpO2 98 %.  General: Pleasant, NAD Psych: Normal affect. Neuro: Alert and oriented X 3. Moves all extremities spontaneously. HEENT: Normal  Neck: Supple without bruits or JVD. Lungs:  Resp regular and unlabored, CTA. Heart: RRR no s3, s4, soft  systolic murmur at USB. Bilateral bruits at clavicles, inferior neck. Abdomen: Soft, non-tender, non-distended, BS + x 4.  Extremities: No clubbing, cyanosis. 2-3+ LE edema. DP/PT/Radials 2+ and equal bilaterally.  Labs   Recent Labs  05/20/14 0128 05/22/14 1530  TROPONINI <0.30 <0.30   Lab Results  Component Value Date   WBC 3.8* 05/22/2014   HGB 8.7* 05/22/2014   HCT 27.4* 05/22/2014   MCV 90.4 05/22/2014   PLT 175 05/22/2014    Recent Labs Lab 05/22/14 1530  NA 139  K 3.7  CL 103  CO2 24  BUN 36*  CREATININE 1.56*  CALCIUM 8.8  PROT 6.1  BILITOT 0.4  ALKPHOS 86  ALT 14  AST 18  GLUCOSE 224*   Lab Results  Component Value Date  CHOL 138 04/30/2014   HDL 29* 04/30/2014   LDLCALC 75 04/30/2014   TRIG 169* 04/30/2014   No results found for: DDIMER  Radiology/Studies  Ct Angio Chest Aorta W/cm &/or Wo/cm  04/27/2014   CLINICAL DATA:  New bruits at the base of neck, evaluate thoracic aorta to rule out obstruction of the aorta, no symptomsSx: CABG, aorta-bifem bypass, cardiac cathNo h/o Ca, no trauma  EXAM: CT ANGIOGRAPHY CHEST WITH CONTRAST  TECHNIQUE: Multidetector CT imaging of the chest was performed using the standard protocol during bolus administration of intravenous contrast. Multiplanar CT image reconstructions and MIPs were obtained to evaluate the vascular anatomy.  CONTRAST:  41mL OMNIPAQUE IOHEXOL 350 MG/ML SOLN  COMPARISON:  None.  FINDINGS: The heart size is enlarged. There is no pericardial effusion. There is evidence of prior CABG. The thoracic aorta is normal in caliber. There is thoracic aortic atherosclerosis. 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 The branch vessels arising from the aortic arch are patent without stenosis. There is minimal calcific plaque at the origin of the left subclavian artery. The visualized portions of the proximal carotid arteries are patent. The visualized portions of the subclavian arteries are patent. There is atherosclerotic disease  within the abdominal aorta at the level of the renal arteries. The renal arteries are patent. There is mild atherosclerotic plaque within the proximal SMA.  The lungs are clear. There is no focal consolidation, pleural effusion or pneumothorax.  There is no axillary, hilar, or mediastinal adenopathy.  There is no lytic or blastic osseous lesion.  There are cholelithiasis.  Review of the MIP images confirms the above findings.  IMPRESSION: 1. Bovine aortic arch. Branch vessels arising from the aortic arch are patent without stenosis. The proximal portions of bilateral carotid arteries and subclavian arteries are patent.   Electronically Signed   By: Kathreen Devoid   On: 04/27/2014 17:39    ECG  SB, cristae pattern. NSSTTWC. Rate 45bpm. Compared to ECG from 04/16/14, rate is lower (was 58).   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   She has poorly controlled hypertension based on her home cuff measurements. She has not corroborated readings with an office measurement so it is not completely clear how high the BPs actually are. BPs have been OK while here. Of note, the chlorthalidone component of the Edarbyclor will likely still take a few days to weeks to hit peak efficacy.   She does have a history of bradycardia (usually in 67s) and this is worse today, I suspect related to the higher dose of clonidine, so this will need to be decreased.  It is possible that she is symptomatic from the bradycardia although she was bradycardic during my exam and completely asymptomatic.   1. Reduce clonidine to 0.1mg  BID which is the prior dose 2. Patient (and daughter) will go to local pharmacy with BP cuff to assess accuracy right after  leaving ED and get a new cuff if needed. 3. Will give RX for hydralazine 25mg  q6h PRN SBP 160mmHg or greater for 2 consecutive measurements 10 minutes apart.  She will recheck her BP 1 hour after PRN dose. She will call if BP remains elevated.  4. We discussed that the bradycardia could put  her at risk for syncope, lightheadedness, dizziness although this should get better over the next 24 hours. Her daughter will stay with her through the weekend to allow her to take things easy. 5. She agreed to get her BP (and electrolytes) checked with her PCP this week and keep her appt with Dr. Martinique on 06/01/14.  6. She very much wants to decrease her LE edema.  I am hesitant to increase her diuretics given that her Darbyclor was just restarted. She does not have dyspnea, abdominal distention or recent weight gain and so I don't think this needs to be urgently treated. If her renal function remains stable this week after having been on the medication for about a week, it would be reasonable to give her some additional lasix at that time.    Tobi Bastos, MD 05/22/2014, 5:26 PM

## 2014-05-22 NOTE — ED Provider Notes (Signed)
CSN: [id number]     Arrival date & time 05/22/14  1501 History   First MD Initiated Contact with Patient 05/22/14 1544     Chief Complaint  Patient presents with  . Hypertension     (Consider location/radiation/quality/duration/timing/severity/associated sxs/prior Treatment) HPI Comments: This is a 68 y/o female with a PMHx of HTN, DM, PAD, CAD and stage 3 CKD who presents to the ED with her daughter with hypertension. Patient was seen in the emergency department 2 days ago for the same, given IV hydralazine, and discharged home. She called her primary care physician the next day who increased her clonidine from 0.1 to 0.2. Patient states when she checked her blood pressure at home she had a systolic reading of [id number]. She is not sure of her blood pressure cuff is accurate, however did not bring it with her today. She states her blood pressure has been fluctuating. Admits to increased lower extremity edema over the past 2 days. States she feels slightly lightheaded and dizzy. Denies chest pain, shortness of breath or vision changes. Patient also states she has an appointment in 4 days with a gastroenterologist for a colonoscopy to evaluate for anemia. Cardiologist Dr. Martinique.  Patient is a 68 y.o. female presenting with hypertension. The history is provided by the patient and a relative.  Hypertension    Past Medical History  Diagnosis Date  . Claudication   . Diabetes mellitus   . Hypertension   . Hyperlipidemia   . DJD (degenerative joint disease)   . PAD (peripheral artery disease)   . Coronary artery disease   . Leg pain   . Carotid artery occlusion   . Obesity   . DDD (degenerative disc disease)   . DJD (degenerative joint disease)   . Shortness of breath     exertion  . Anxiety   . Diabetic coma   . Edema   . CKD (chronic kidney disease) stage 3, GFR 30-59 ml/min 04/18/2014   Past Surgical History  Procedure Laterality Date  . Cardiac catheterization  12/01/2009    EF 65%   . Removal of fibrous cyst from right breast  10+ YEARS  . Retinal detachment surgery  10+ YEARS    LEFT EYE  . Coronary artery bypass graft  12/08/2009    LIMA GRAFT TO THE DISTAL LAD AND SAPHENOUS VEIN GRAFT TO THE OBTUSE MARGINAL VESSEL  . Transthoracic echocardiogram  12/01/2009    EF 60-65%  . Cardiovascular stress test  11/28/2009    EF 75%  . Aorta - bilateral femoral artery bypass graft  05/25/2011    Procedure: AORTA BIFEMORAL BYPASS GRAFT;  Surgeon: Mal Misty, MD;  Location: [place];  Service: Vascular;  Laterality: N/A;  . Pr vein bypass graft,aorto-fem-pop  05/25/11   Family History  Problem Relation Age of Onset  . Hypertension Mother   . Coronary artery disease Father   . Hypertension Sister   . Cirrhosis Brother   . Kidney disease Daughter    History  Substance Use Topics  . Smoking status: Former Smoker -- 20 years    Types: Cigarettes    Quit date: 07/10/1991  . Smokeless tobacco: Never Used  . Alcohol Use: No   OB History    No data available     Review of Systems  10 Systems reviewed and are negative for acute change except as noted in the HPI.  Allergies  Iohexol and Sulfa antibiotics  Home Medications   Prior to Admission medications  Medication Sig Start Date End Date Taking? Authorizing Provider  acetaminophen (TYLENOL) 325 MG tablet Take 325-650 mg by mouth every 6 (six) hours as needed. For pain   Yes Historical Provider, MD  aspirin 325 MG tablet Take 325 mg by mouth daily.    Yes Historical Provider, MD  atorvastatin (LIPITOR) 80 MG tablet Take 1 tablet (80 mg total) by mouth daily. 04/20/14  Yes Peter M Martinique, MD  Azilsartan-Chlorthalidone (EDARBYCLOR) 40-25 MG TABS Take 1 tablet by mouth daily.   Yes Historical Provider, MD  carvedilol (COREG) 12.5 MG tablet Take 1 tablet (12.5 mg total) by mouth 2 (two) times daily. 09/23/13  Yes Peter M Martinique, MD  Cholecalciferol (VITAMIN D3) 2000 UNITS TABS Take 2,000 Units by mouth 1 day or 1 dose.    Yes Historical Provider, MD  cloNIDine (CATAPRES) 0.1 MG tablet Take 1 tablet (0.1 mg total) by mouth 2 (two) times daily. 05/18/14  Yes Peter M Martinique, MD  Coenzyme Q10 200 MG capsule Take 200 mg by mouth daily.     Yes Historical Provider, MD  diphenhydrAMINE (BENADRYL) 25 mg capsule Take 25 mg four times a day day before CT and take 25 mg morning of CT 04/20/14  Yes Peter M Martinique, MD  famotidine (PEPCID) 20 MG tablet Take 20 mg daily day before CT and 20 mg morning of CT 04/20/14  Yes Peter M Martinique, MD  furosemide (LASIX) 40 MG tablet Take 40 mg by mouth daily.    Yes Historical Provider, MD  insulin glargine (LANTUS) 100 UNIT/ML injection Inject 16 Units into the skin 2 (two) times daily.  09/20/11  Yes Domenic Polite, MD  isosorbide-hydrALAZINE (BIDIL) 20-37.5 MG per tablet Take 2 tablets by mouth 3 (three) times daily.   Yes Historical Provider, MD  Liraglutide (VICTOZA [place]) Inject 1.8 Units into the skin daily.    Yes Historical Provider, MD  Magnesium 250 MG TABS Take 250 mg by mouth daily.   Yes Historical Provider, MD  predniSONE (DELTASONE) 20 MG tablet Take 60 mg ( 3 tablets ) night before CT and 60 mg ( 3 tablets ) morning of CT 04/20/14  Yes Peter M Martinique, MD  Probiotic Product (PROBIOTIC DAILY PO) Take 1 tablet by mouth daily.    Yes Historical Provider, MD  vitamin B-12 (CYANOCOBALAMIN) 500 MCG tablet Take 500 mcg by mouth daily.   Yes Historical Provider, MD  hydrALAZINE (APRESOLINE) 25 MG tablet Take 1 tablet (25 mg total) by mouth every 6 (six) hours as needed (Blood Pressure > 180). 05/22/14   Jeff Mccallum M Beverlyn Mcginness, PA-C   BP 168/69 mmHg  Pulse 42  Temp(Src) 98 F (36.7 C) (Oral)  Resp 13  SpO2 96% Physical Exam  Constitutional: She is oriented to person, place, and time. She appears well-developed and well-nourished. No distress.  Obese.  HENT:  Head: Normocephalic and atraumatic.  Mouth/Throat: Oropharynx is clear and moist.  Eyes: Conjunctivae and EOM are normal. Pupils are  equal, round, and reactive to light.  Neck: Normal range of motion. Neck supple. No JVD present.  Cardiovascular: Normal rate, normal heart sounds and intact distal pulses.   Bradycardic. +2 pitting edema LE bilateral.  Pulmonary/Chest: Effort normal and breath sounds normal. No respiratory distress.  Abdominal: Soft. Bowel sounds are normal. There is no tenderness.  Musculoskeletal: Normal range of motion. She exhibits no edema.  Neurological: She is alert and oriented to person, place, and time. She has normal strength. No sensory deficit.  Speech fluent,  goal oriented. Moves limbs without ataxia. Equal grip strength bilateral.  Skin: Skin is warm and dry. She is not diaphoretic.  Psychiatric: She has a normal mood and affect. Her behavior is normal.  Nursing note and vitals reviewed.   ED Course  Procedures (including critical care time) Labs Review Labs Reviewed  CBC - Abnormal; Notable for the following:    WBC 3.8 (*)    RBC 3.03 (*)    Hemoglobin 8.7 (*)    HCT 27.4 (*)    RDW 15.6 (*)    All other components within normal limits  COMPREHENSIVE METABOLIC PANEL - Abnormal; Notable for the following:    Glucose, Bld 224 (*)    BUN 36 (*)    Creatinine, Ser 1.56 (*)    Albumin 2.8 (*)    GFR calc non Af Amer 33 (*)    GFR calc Af Amer 38 (*)    All other components within normal limits  PRO B NATRIURETIC PEPTIDE - Abnormal; Notable for the following:    Pro B Natriuretic peptide (BNP) 1132.0 (*)    All other components within normal limits  TROPONIN I    Imaging Review No results found.   EKG Interpretation   Date/Time:  Saturday May 22 2014 15:15:11 EST Ventricular Rate:  45 PR Interval:  170 QRS Duration: 82 QT Interval:  488 QTC Calculation: 422 R Axis:   77 Text Interpretation:  Sinus bradycardia T wave abnormality, consider  inferior ischemia Abnormal ECG no significant change since 2012 Confirmed  by GOLDSTON  MD, SCOTT ([id number]) on 05/22/2014  4:51:47 PM      MDM   Final diagnoses:  Hypertension secondary to other renal disorders  Anemia, unspecified anemia type   Patient presenting with concerns of hypertension as stated above. Recently seen in the emergency department for the same. Blood pressure on arrival 160/42. She has increased her clonidine as advised by her PCP. She is bradycardic. She is in no apparent distress. No chest pain or shortness of breath. Labs showing a slight drop of hemoglobin from 9.3-8.7, she is scheduled to have a colonoscopy in 4 days. BNP without any significant change. Given concerns of her uncontrolled hypertension, cardiology consult appreciated. Pt seen by Dr. Tommi Rumps who advised pt to decreased clonidine to 0.1 mg BID as she has bradycardia, and prescribe hydralazine 25 mg q 6 hrs PRN systolic BP [id number]. She is also advised to bring BP cuff to local drug store for calibration. F/u with PCP this week, and she has appt with Dr. Martinique on Nov 24. Stable for d/c. Return precautions given. Patient states understanding of treatment care plan and is agreeable.  Discussed with attending Dr. Regenia Skeeter who also evaluated patient and agrees with plan of care.   Carman Ching, PA-C 05/22/14 Bartholomew, MD 05/23/14 [phone]

## 2014-05-22 NOTE — Discharge Instructions (Signed)
Take clonidine 0.1 mg twice daily as prescribed. Take hydralazine every 6 hours AS NEEDED for systolic blood pressure > 180.  Hypertension Hypertension, commonly called high blood pressure, is when the force of blood pumping through your arteries is too strong. Your arteries are the blood vessels that carry blood from your heart throughout your body. A blood pressure reading consists of a higher number over a lower number, such as 110/72. The higher number (systolic) is the pressure inside your arteries when your heart pumps. The lower number (diastolic) is the pressure inside your arteries when your heart relaxes. Ideally you want your blood pressure below 120/80. Hypertension forces your heart to work harder to pump blood. Your arteries may become narrow or stiff. Having hypertension puts you at risk for heart disease, stroke, and other problems.  RISK FACTORS Some risk factors for high blood pressure are controllable. Others are not.  Risk factors you cannot control include:   Race. You may be at higher risk if you are African American.  Age. Risk increases with age.  Gender. Men are at higher risk than women before age 53 years. After age 63, women are at higher risk than men. Risk factors you can control include:  Not getting enough exercise or physical activity.  Being overweight.  Getting too much fat, sugar, calories, or salt in your diet.  Drinking too much alcohol. SIGNS AND SYMPTOMS Hypertension does not usually cause signs or symptoms. Extremely high blood pressure (hypertensive crisis) may cause headache, anxiety, shortness of breath, and nosebleed. DIAGNOSIS  To check if you have hypertension, your health care provider will measure your blood pressure while you are seated, with your arm held at the level of your heart. It should be measured at least twice using the same arm. Certain conditions can cause a difference in blood pressure between your right and left arms. A blood  pressure reading that is higher than normal on one occasion does not mean that you need treatment. If one blood pressure reading is high, ask your health care provider about having it checked again. TREATMENT  Treating high blood pressure includes making lifestyle changes and possibly taking medicine. Living a healthy lifestyle can help lower high blood pressure. You may need to change some of your habits. Lifestyle changes may include:  Following the DASH diet. This diet is high in fruits, vegetables, and whole grains. It is low in salt, red meat, and added sugars.  Getting at least 2 hours of brisk physical activity every week.  Losing weight if necessary.  Not smoking.  Limiting alcoholic beverages.  Learning ways to reduce stress. If lifestyle changes are not enough to get your blood pressure under control, your health care provider may prescribe medicine. You may need to take more than one. Work closely with your health care provider to understand the risks and benefits. HOME CARE INSTRUCTIONS  Have your blood pressure rechecked as directed by your health care provider.   Take medicines only as directed by your health care provider. Follow the directions carefully. Blood pressure medicines must be taken as prescribed. The medicine does not work as well when you skip doses. Skipping doses also puts you at risk for problems.   Do not smoke.   Monitor your blood pressure at home as directed by your health care provider. SEEK MEDICAL CARE IF:   You think you are having a reaction to medicines taken.  You have recurrent headaches or feel dizzy.  You have swelling in  your ankles.  You have trouble with your vision. SEEK IMMEDIATE MEDICAL CARE IF:  You develop a severe headache or confusion.  You have unusual weakness, numbness, or feel faint.  You have severe chest or abdominal pain.  You vomit repeatedly.  You have trouble breathing. MAKE SURE YOU:   Understand  these instructions.  Will watch your condition.  Will get help right away if you are not doing well or get worse. Document Released: 06/25/2005 Document Revised: 11/09/2013 Document Reviewed: 04/17/2013 [facility] Patient Information 2015 [place], [place]. This information is not intended to replace advice given to you by your health care provider. Make sure you discuss any questions you have with your health care provider.  Anemia, Nonspecific Anemia is a condition in which the concentration of red blood cells or hemoglobin in the blood is below normal. Hemoglobin is a substance in red blood cells that carries oxygen to the tissues of the body. Anemia results in not enough oxygen reaching these tissues.  CAUSES  Common causes of anemia include:   Excessive bleeding. Bleeding may be internal or external. This includes excessive bleeding from periods (in women) or from the intestine.   Poor nutrition.   Chronic kidney, thyroid, and liver disease.  Bone marrow disorders that decrease red blood cell production.  Cancer and treatments for cancer.  HIV, AIDS, and their treatments.  Spleen problems that increase red blood cell destruction.  Blood disorders.  Excess destruction of red blood cells due to infection, medicines, and autoimmune disorders. SIGNS AND SYMPTOMS   Minor weakness.   Dizziness.   Headache.  Palpitations.   Shortness of breath, especially with exercise.   Paleness.  Cold sensitivity.  Indigestion.  Nausea.  Difficulty sleeping.  Difficulty concentrating. Symptoms may occur suddenly or they may develop slowly.  DIAGNOSIS  Additional blood tests are often needed. These help your health care provider determine the best treatment. Your health care provider will check your stool for blood and look for other causes of blood loss.  TREATMENT  Treatment varies depending on the cause of the anemia. Treatment can include:   Supplements of iron,  vitamin [id number], or folic acid.   Hormone medicines.   A blood transfusion. This may be needed if blood loss is severe.   Hospitalization. This may be needed if there is significant continual blood loss.   Dietary changes.  Spleen removal. HOME CARE INSTRUCTIONS Keep all follow-up appointments. It often takes many weeks to correct anemia, and having your health care provider check on your condition and your response to treatment is very important. SEEK IMMEDIATE MEDICAL CARE IF:   You develop extreme weakness, shortness of breath, or chest pain.   You become dizzy or have trouble concentrating.  You develop heavy vaginal bleeding.   You develop a rash.   You have bloody or black, tarry stools.   You faint.   You vomit up blood.   You vomit repeatedly.   You have abdominal pain.  You have a fever or persistent symptoms for more than 2-3 days.   You have a fever and your symptoms suddenly get worse.   You are dehydrated.  MAKE SURE YOU:  Understand these instructions.  Will watch your condition.  Will get help right away if you are not doing well or get worse. Document Released: 08/02/2004 Document Revised: 02/25/2013 Document Reviewed: 12/19/2012 [facility] Patient Information 2015 [place], [place]. This information is not intended to replace advice given to you by your health care provider.  Make sure you discuss any questions you have with your health care provider. ° °

## 2014-05-24 ENCOUNTER — Telehealth: Payer: Self-pay | Admitting: Cardiology

## 2014-05-24 NOTE — Telephone Encounter (Signed)
Spoke with pt, she is retaining fluid all the way up to the thigh. She reports it is there all day. She does not weigh. She denies SOB within the home and denies orthopnea.  She has a congested cough of clear sputum. She would like to increase the furosemide to 40 mg bid. Will forward for dr jordan's review due to her recent renal problems. Pt agreed with this plan.

## 2014-05-24 NOTE — Telephone Encounter (Signed)
Pt says she is still retaining a lot of fluid. She wants to know if she can increase her Furosemide?

## 2014-05-24 NOTE — Telephone Encounter (Signed)
Go ahead and increase lasix to bid until I see her next week.  Peter Martinique MD, [facility]

## 2014-05-25 MED ORDER — FUROSEMIDE 40 MG PO TABS: ORAL_TABLET | ORAL | Status: DC

## 2014-05-25 NOTE — Telephone Encounter (Signed)
Returned call to patient Dr.Jordan advised to increase Lasix to twice a day until you see him 06/01/14.

## 2014-05-25 NOTE — Addendum Note (Signed)
Addended by: Golden Hurter D on: 05/25/2014 08:49 AM   Modules accepted: Orders, Medications

## 2014-06-01 ENCOUNTER — Ambulatory Visit (INDEPENDENT_AMBULATORY_CARE_PROVIDER_SITE_OTHER): Payer: Medicare Other | Admitting: Cardiology

## 2014-06-01 ENCOUNTER — Encounter: Payer: Self-pay | Admitting: Cardiology

## 2014-06-01 VITALS — BP 140/64 | HR 72 | Ht 62.0 in | Wt 222.9 lb

## 2014-06-01 DIAGNOSIS — R0989 Other specified symptoms and signs involving the circulatory and respiratory systems: Secondary | ICD-10-CM

## 2014-06-01 DIAGNOSIS — I2510 Atherosclerotic heart disease of native coronary artery without angina pectoris: Secondary | ICD-10-CM

## 2014-06-01 DIAGNOSIS — I10 Essential (primary) hypertension: Secondary | ICD-10-CM

## 2014-06-01 DIAGNOSIS — I739 Peripheral vascular disease, unspecified: Secondary | ICD-10-CM

## 2014-06-01 DIAGNOSIS — R609 Edema, unspecified: Secondary | ICD-10-CM

## 2014-06-01 DIAGNOSIS — N183 Chronic kidney disease, stage 3 unspecified: Secondary | ICD-10-CM

## 2014-06-01 LAB — BASIC METABOLIC PANEL
BUN: 36 mg/dL — ABNORMAL HIGH (ref 6–23)
CO2: 26 meq/L (ref 19–32)
CREATININE: 1.41 mg/dL — AB (ref 0.50–1.10)
Calcium: 8.9 mg/dL (ref 8.4–10.5)
Chloride: 112 mEq/L (ref 96–112)
GLUCOSE: 117 mg/dL — AB (ref 70–99)
Potassium: 4.4 mEq/L (ref 3.5–5.3)
Sodium: 145 mEq/L (ref 135–145)

## 2014-06-01 NOTE — Progress Notes (Signed)
Norma [name] Date of Birth: 04/17/1946 Medical Record [id number]  History of Present Illness: Norma [name] comes back today for followup PAD. She has a hsitory of  complex vascular surgery in November of 2012 including aorto-bifemoral bypass for aortic occlusive disease. She had CABG back in June of 2011. She has chronic swelling of her legs from venous insufficiency.  Echo in Feb. 2014  showed a normal EF and moderate PHTN and grade 1 diastolic dysfunction.. She has had some asymptomatic bradycardia - she remains on a lower dose of her Coreg to 12.5 mg BID.  She has refractory HTN.  More recently she complains of pulsatile tinnitus.  Carotid dopplers in august showed no obstructive disease. She underwent CT of the aorta which showed no obstructive disease in the aorta or great vessels. There was no aneurysm. Unfortunately she developed contrast induced nephropathy with increased BUN to 85 and creatinine to 3.57. Her diuretics and ARB were held. She then developed increasing BP and edema. Fortunately renal indices improved and her Edarbychlor and lasix were resumed. She is feeling better now. Still notes rushing in her right ear. No chest pain or SOB. Edema better. She was found to have recent anemia with Hgb down to 8.7. Colonoscopy was negative. Now on iron therapy.  Current Outpatient Prescriptions  Medication Sig Dispense Refill  . acetaminophen (TYLENOL) 325 MG tablet Take 325-650 mg by mouth every 6 (six) hours as needed. For pain    . aspirin 325 MG tablet Take 325 mg by mouth daily.     Marland Kitchen atorvastatin (LIPITOR) 80 MG tablet Take 1 tablet (80 mg total) by mouth daily. 30 tablet 6  . Azilsartan-Chlorthalidone (EDARBYCLOR) 40-25 MG TABS Take 1 tablet by mouth daily.    . carvedilol (COREG) 12.5 MG tablet Take 1 tablet (12.5 mg total) by mouth 2 (two) times daily. 180 tablet 3  . Cholecalciferol (VITAMIN D3) 2000 UNITS TABS Take 2,000 Units by mouth 1 day or 1 dose.    . cloNIDine (CATAPRES) 0.1  MG tablet Take 1 tablet (0.1 mg total) by mouth 2 (two) times daily. 180 tablet 3  . Coenzyme Q10 200 MG capsule Take 200 mg by mouth daily.      . diphenhydrAMINE (BENADRYL) 25 mg capsule Take 25 mg four times a day day before CT and take 25 mg morning of CT 30 capsule 0  . furosemide (LASIX) 40 MG tablet Take 40 mg twice a day 60 tablet 6  . hydrALAZINE (APRESOLINE) 25 MG tablet Take 1 tablet (25 mg total) by mouth every 6 (six) hours as needed (Blood Pressure > 180). 30 tablet 0  . insulin glargine (LANTUS) 100 UNIT/ML injection Inject 16 Units into the skin 2 (two) times daily.     . isosorbide-hydrALAZINE (BIDIL) 20-37.5 MG per tablet Take 2 tablets by mouth 3 (three) times daily.    . Liraglutide (VICTOZA [place]) Inject 1.8 Units into the skin daily.     . Magnesium 250 MG TABS Take 250 mg by mouth daily.    . Probiotic Product (PROBIOTIC DAILY PO) Take 1 tablet by mouth daily.     . vitamin B-12 (CYANOCOBALAMIN) 500 MCG tablet Take 500 mcg by mouth daily.     No current facility-administered medications for this visit.    Allergies  Allergen Reactions  . Iohexol      Code: RASH, Desc: pt called 1 day post scanning stating that skin was red and "itching all over" some what better but still  had symptom.. instructed pt to take benadryl to relieve symptoms,per dr Alvester Chou.if any problems call back/mms, Onset Date: [id number]   . Sulfa Antibiotics Other (See Comments)    unknown    Past Medical History  Diagnosis Date  . Claudication   . Diabetes mellitus   . Hypertension   . Hyperlipidemia   . DJD (degenerative joint disease)   . PAD (peripheral artery disease)   . Coronary artery disease   . Leg pain   . Carotid artery occlusion   . Obesity   . DDD (degenerative disc disease)   . DJD (degenerative joint disease)   . Shortness of breath     exertion  . Anxiety   . Diabetic coma   . Edema   . CKD (chronic kidney disease) stage 3, GFR 30-59 ml/min 04/18/2014    Past Surgical  History  Procedure Laterality Date  . Cardiac catheterization  12/01/2009    EF 65%  . Removal of fibrous cyst from right breast  10+ YEARS  . Retinal detachment surgery  10+ YEARS    LEFT EYE  . Coronary artery bypass graft  12/08/2009    LIMA GRAFT TO THE DISTAL LAD AND SAPHENOUS VEIN GRAFT TO THE OBTUSE MARGINAL VESSEL  . Transthoracic echocardiogram  12/01/2009    EF 60-65%  . Cardiovascular stress test  11/28/2009    EF 75%  . Aorta - bilateral femoral artery bypass graft  05/25/2011    Procedure: AORTA BIFEMORAL BYPASS GRAFT;  Surgeon: Mal Misty, MD;  Location: [facility] OR;  Service: Vascular;  Laterality: N/A;  . Pr vein bypass graft,aorto-fem-pop  05/25/11    History  Smoking status  . Former Smoker -- 20 years  . Types: Cigarettes  . Quit date: 07/10/1991  Smokeless tobacco  . Never Used    History  Alcohol Use No    Family History  Problem Relation Age of Onset  . Hypertension Mother   . Coronary artery disease Father   . Hypertension Sister   . Cirrhosis Brother   . Kidney disease Daughter     Review of Systems: The review of systems is per the HPI. Skin has been dry and peeling.  All other systems were reviewed and are negative.  Physical Exam: BP 140/64 mmHg  Pulse 72  Ht 5\' 2"  (1.575 m)  Wt 222 lb 14.4 oz (101.107 kg)  BMI 40.76 kg/m2 Patient is very pleasant and in no acute distress. She is obese. Skin is warm and dry. Color is normal.  HEENT is unremarkable. Normocephalic/atraumatic. PERRL. Sclera are nonicteric. Neck is supple. No masses. No JVD. There are bilateral loud bruits R>L at the base of the neck and subclavian areas. Lungs are clear. Cardiac exam shows a regular rate and rhythm. Normal S1-2, no gallop or cardiac murmur. Abdomen is soft. NT, BS +. No masses. Extremities are with 1-2+ edema. Gait and ROM are intact. No gross neurologic deficits noted.  LABORATORY DATA:   Lab Results  Component Value Date   WBC 3.8* 05/22/2014   HGB 8.7*  05/22/2014   HCT 27.4* 05/22/2014   PLT 175 05/22/2014   GLUCOSE 224* 05/22/2014   CHOL 138 04/30/2014   TRIG 169* 04/30/2014   HDL 29* 04/30/2014   LDLCALC 75 04/30/2014   ALT 14 05/22/2014   AST 18 05/22/2014   NA 139 05/22/2014   K 3.7 05/22/2014   CL 103 05/22/2014   CREATININE 1.56* 05/22/2014   BUN 36* 05/22/2014   CO2  24 05/22/2014   TSH 2.193 04/16/2014   INR 1.20 05/28/2011   HGBA1C 6.1* 06/13/2011   Prior renal duplex study 04/23/13 demonstrated normal abdominal aortic caliber. The kidneys were normal in size bilaterally. There is no evidence of hydronephrosis. Velocities were elevated bilaterally however renal /aortic ratios were normal.  CLINICAL DATA: New bruits at the base of neck, evaluate thoracic aorta to rule out obstruction of the aorta, no symptomsSx: CABG, aorta-bifem bypass, cardiac cathNo h/o Ca, no trauma  EXAM: CT ANGIOGRAPHY CHEST WITH CONTRAST  TECHNIQUE: Multidetector CT imaging of the chest was performed using the standard protocol during bolus administration of intravenous contrast. Multiplanar CT image reconstructions and MIPs were obtained to evaluate the vascular anatomy.  CONTRAST: 66mL OMNIPAQUE IOHEXOL 350 MG/ML SOLN  COMPARISON: None.  FINDINGS: The heart size is enlarged. There is no pericardial effusion. There is evidence of prior CABG. The thoracic aorta is normal in caliber. There is thoracic aortic atherosclerosis. 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 The branch vessels arising from the aortic arch are patent without stenosis. There is minimal calcific plaque at the origin of the left subclavian artery. The visualized portions of the proximal carotid arteries are patent. The visualized portions of the subclavian arteries are patent. There is atherosclerotic disease within the abdominal aorta at the level of the renal arteries. The renal arteries are patent. There is mild atherosclerotic plaque within the proximal SMA.  The  lungs are clear. There is no focal consolidation, pleural effusion or pneumothorax.  There is no axillary, hilar, or mediastinal adenopathy.  There is no lytic or blastic osseous lesion.  There are cholelithiasis.  Review of the MIP images confirms the above findings.  IMPRESSION: 1. Bovine aortic arch. Branch vessels arising from the aortic arch are patent without stenosis. The proximal portions of bilateral carotid arteries and subclavian arteries are patent.   Electronically Signed  By: Kathreen Devoid  On: 04/27/2014 17:39           Assessment / Plan: 1. HTN - blood pressure control is resistant to medical treatment.  Improved today. Need to recheck renal function now back on lasix and ARB. Off amlodipine now. I have made no changes today.  Renal perfusion looks good.  2. CAD - with past CABG - no symptoms reported.   3. PVD - with past aortobifem bypass in 2012 - by Dr. Kellie Simmering  4. Palpitations with intermittent racing. Event monitor was unremarkable.   5. Severe aortic atherosclerosis. No obstructive disease or aneurysm.  6. Anemia. Chronic. Follow up with primary care.  7. Acute contrast induced nephropathy. Avoid contrast studies in the future. Check renal function today.

## 2014-06-01 NOTE — Patient Instructions (Signed)
We will check your kidney function today  Continue your current therapy  I will see you in 3 months.

## 2014-06-15 ENCOUNTER — Telehealth: Payer: Self-pay | Admitting: Cardiology

## 2014-06-15 NOTE — Telephone Encounter (Signed)
Returned call to patient she stated she has had pain in rt foot for the last 2 days.Stated she can hardly walk due to pain in rt foot.Stated rt foot is also swollen.Stated no swelling or pain in left foot.Advised to see PCP.Advised to call back if needed.

## 2014-06-15 NOTE — Telephone Encounter (Signed)
Please call,right foot is so swollen can not stand on it, She is already taking 80 mg of Furosemide a day,still in a lot of pain.

## 2014-07-05 ENCOUNTER — Telehealth: Payer: Self-pay | Admitting: Cardiology

## 2014-07-05 NOTE — Telephone Encounter (Signed)
Pt would like some samples of Bidil and Edarbychlor please.

## 2014-07-05 NOTE — Telephone Encounter (Signed)
Returned call to patient samples of bidil left at front desk of [facility] office.Office out of edarbychlor samples.

## 2014-08-06 ENCOUNTER — Telehealth: Payer: Self-pay | Admitting: Cardiology

## 2014-08-06 NOTE — Telephone Encounter (Signed)
Returned call to patient samples of Bidil left at front desk of [facility] office.

## 2014-08-06 NOTE — Telephone Encounter (Signed)
Please call,concerning her medicine and blood pressure.

## 2014-09-09 ENCOUNTER — Ambulatory Visit: Payer: Medicare Other | Admitting: Cardiology

## 2014-09-16 ENCOUNTER — Encounter: Payer: Self-pay | Admitting: Cardiology

## 2014-09-16 ENCOUNTER — Ambulatory Visit (INDEPENDENT_AMBULATORY_CARE_PROVIDER_SITE_OTHER): Payer: Medicare Other | Admitting: Cardiology

## 2014-09-16 VITALS — BP 170/50 | HR 64 | Ht 62.0 in | Wt 223.9 lb

## 2014-09-16 DIAGNOSIS — N183 Chronic kidney disease, stage 3 unspecified: Secondary | ICD-10-CM

## 2014-09-16 DIAGNOSIS — I2510 Atherosclerotic heart disease of native coronary artery without angina pectoris: Secondary | ICD-10-CM

## 2014-09-16 DIAGNOSIS — I10 Essential (primary) hypertension: Secondary | ICD-10-CM

## 2014-09-16 DIAGNOSIS — R609 Edema, unspecified: Secondary | ICD-10-CM

## 2014-09-16 NOTE — Patient Instructions (Signed)
Continue your current therapy  I will see you in 4 months  

## 2014-09-16 NOTE — Progress Notes (Signed)
Norma [name] Date of Birth: 1945/12/02 Medical Record [id number]  History of Present Illness: Norma [name] is seen for followup CAD and PAD. She has a hsitory of  complex vascular surgery in November of 2012 including aorto-bifemoral bypass for aortic occlusive disease. She had CABG back in June of 2011. She has chronic swelling of her legs from venous insufficiency.  Echo in Feb. 2014  showed a normal EF and moderate PHTN and grade 1 diastolic dysfunction.. She has had some asymptomatic bradycardia - she remains on a lower dose of her Coreg to 12.5 mg BID.  She has refractory HTN.  She has a history of pulsatile tinnitus.  Carotid dopplers in August 2015 showed no obstructive disease. She underwent CT of the aorta which showed no obstructive disease in the aorta or great vessels. There was no aneurysm. Unfortunately she developed contrast induced nephropathy with increased BUN to 85 and creatinine to 3.57. Her renal function later returned to her baseline creatinine of 1.4.    Today she reports that her pharmacy did not have Bidil or Edarbychlor. She was switched to losartan, hydralazine, and chlothalidone. She still has chronic edema in her legs. She wears support hose. She did not take her lasix today since she is going out. She still has a "whooshing pulse" in her right ear. Her sensation of "dripping" has abated. She does note heart beats fast when she exerts. She reports Hgb has improved but doesn't know how much. She is scheduled to see Dr. Florene Glen for evaluation of CKD and resistant HTN.  Current Outpatient Prescriptions  Medication Sig Dispense Refill  . acetaminophen (TYLENOL) 325 MG tablet Take 325-650 mg by mouth every 6 (six) hours as needed. For pain    . allopurinol (ZYLOPRIM) 100 MG tablet Take 100 mg by mouth daily as needed.    Marland Kitchen aspirin 325 MG tablet Take 325 mg by mouth daily.     Marland Kitchen atorvastatin (LIPITOR) 80 MG tablet Take 1 tablet (80 mg total) by mouth daily. 30 tablet 6  .  carvedilol (COREG) 12.5 MG tablet Take 1 tablet (12.5 mg total) by mouth 2 (two) times daily. 180 tablet 3  . chlorthalidone (HYGROTON) 25 MG tablet Take 25 mg by mouth daily.    . Cholecalciferol (VITAMIN D3) 2000 UNITS TABS Take 2,000 Units by mouth 1 day or 1 dose.    . cloNIDine (CATAPRES) 0.1 MG tablet Take 1 tablet (0.1 mg total) by mouth 2 (two) times daily. 180 tablet 3  . Coenzyme Q10 200 MG capsule Take 200 mg by mouth daily.      . furosemide (LASIX) 40 MG tablet Take 40 mg twice a day 60 tablet 6  . hydrALAZINE (APRESOLINE) 25 MG tablet Take 75 mg by mouth 2 (two) times daily.    Marland Kitchen HYDROcodone-acetaminophen (NORCO) 7.5-325 MG per tablet Take 1 tablet by mouth 2 (two) times daily as needed.    . insulin glargine (LANTUS) 100 UNIT/ML injection Inject 16 Units into the skin 2 (two) times daily.     . Liraglutide (VICTOZA [place]) Inject 1.8 Units into the skin daily.     Marland Kitchen losartan (COZAAR) 100 MG tablet Take 100 mg by mouth daily.    . Magnesium 250 MG TABS Take 250 mg by mouth daily.    . Probiotic Product (PROBIOTIC DAILY PO) Take 1 tablet by mouth daily.     Marland Kitchen spironolactone (ALDACTONE) 25 MG tablet Take 25 mg by mouth daily.    . vitamin B-12 (  CYANOCOBALAMIN) 500 MCG tablet Take 500 mcg by mouth daily.     No current facility-administered medications for this visit.    Allergies  Allergen Reactions  . Iohexol      Code: RASH, Desc: pt called 1 day post scanning stating that skin was red and "itching all over" some what better but still had symptom.. instructed pt to take benadryl to relieve symptoms,per dr Alvester Chou.if any problems call back/mms, Onset Date: [id number]   . Sulfa Antibiotics Other (See Comments)    unknown    Past Medical History  Diagnosis Date  . Claudication   . Diabetes mellitus   . Hypertension   . Hyperlipidemia   . DJD (degenerative joint disease)   . PAD (peripheral artery disease)   . Coronary artery disease   . Leg pain   . Carotid artery occlusion    . Obesity   . DDD (degenerative disc disease)   . DJD (degenerative joint disease)   . Shortness of breath     exertion  . Anxiety   . Diabetic coma   . Edema   . CKD (chronic kidney disease) stage 3, GFR 30-59 ml/min 04/18/2014    Past Surgical History  Procedure Laterality Date  . Cardiac catheterization  12/01/2009    EF 65%  . Removal of fibrous cyst from right breast  10+ YEARS  . Retinal detachment surgery  10+ YEARS    LEFT EYE  . Coronary artery bypass graft  12/08/2009    LIMA GRAFT TO THE DISTAL LAD AND SAPHENOUS VEIN GRAFT TO THE OBTUSE MARGINAL VESSEL  . Transthoracic echocardiogram  12/01/2009    EF 60-65%  . Cardiovascular stress test  11/28/2009    EF 75%  . Aorta - bilateral femoral artery bypass graft  05/25/2011    Procedure: AORTA BIFEMORAL BYPASS GRAFT;  Surgeon: Mal Misty, MD;  Location: [facility] OR;  Service: Vascular;  Laterality: N/A;  . Pr vein bypass graft,aorto-fem-pop  05/25/11    History  Smoking status  . Former Smoker -- 20 years  . Types: Cigarettes  . Quit date: 07/10/1991  Smokeless tobacco  . Never Used    History  Alcohol Use No    Family History  Problem Relation Age of Onset  . Hypertension Mother   . Coronary artery disease Father   . Hypertension Sister   . Cirrhosis Brother   . Kidney disease Daughter     Review of Systems: The review of systems is per the HPI.   All other systems were reviewed and are negative.  Physical Exam: BP 170/50 mmHg  Pulse 64  Ht 5\' 2"  (1.575 m)  Wt 223 lb 14.4 oz (101.56 kg)  BMI 40.94 kg/m2 Patient is very pleasant and in no acute distress. She is obese. Skin is warm and dry. Color is normal.  HEENT is unremarkable. Normocephalic/atraumatic. PERRL. Sclera are nonicteric. Neck is supple. No masses. No JVD. There are bilateral loud bruits R>L at the base of the neck and subclavian areas. Lungs are clear. Cardiac exam shows a regular rate and rhythm. Normal S1-2, no gallop or cardiac murmur.  Abdomen is soft. NT, BS +. No masses. Extremities are with 2+ edema. Gait and ROM are intact. No gross neurologic deficits noted.  LABORATORY DATA:   Lab Results  Component Value Date   WBC 3.8* 05/22/2014   HGB 8.7* 05/22/2014   HCT 27.4* 05/22/2014   PLT 175 05/22/2014   GLUCOSE 117* 06/01/2014   CHOL 138  04/30/2014   TRIG 169* 04/30/2014   HDL 29* 04/30/2014   LDLCALC 75 04/30/2014   ALT 14 05/22/2014   AST 18 05/22/2014   NA 145 06/01/2014   K 4.4 06/01/2014   CL 112 06/01/2014   CREATININE 1.41* 06/01/2014   BUN 36* 06/01/2014   CO2 26 06/01/2014   TSH 2.193 04/16/2014   INR 1.20 05/28/2011   HGBA1C 6.1* 06/13/2011   Prior renal duplex study 04/23/13 demonstrated normal abdominal aortic caliber. The kidneys were normal in size bilaterally. There is no evidence of hydronephrosis. Velocities were elevated bilaterally however renal /aortic ratios were normal.  CLINICAL DATA: New bruits at the base of neck, evaluate thoracic aorta to rule out obstruction of the aorta, no symptomsSx: CABG, aorta-bifem bypass, cardiac cathNo h/o Ca, no trauma  EXAM: CT ANGIOGRAPHY CHEST WITH CONTRAST  TECHNIQUE: Multidetector CT imaging of the chest was performed using the standard protocol during bolus administration of intravenous contrast. Multiplanar CT image reconstructions and MIPs were obtained to evaluate the vascular anatomy.  CONTRAST: 62mL OMNIPAQUE IOHEXOL 350 MG/ML SOLN  COMPARISON: None.  FINDINGS: The heart size is enlarged. There is no pericardial effusion. There is evidence of prior CABG. The thoracic aorta is normal in caliber. There is thoracic aortic atherosclerosis. 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 The branch vessels arising from the aortic arch are patent without stenosis. There is minimal calcific plaque at the origin of the left subclavian artery. The visualized portions of the proximal carotid arteries are patent. The visualized portions of the  subclavian arteries are patent. There is atherosclerotic disease within the abdominal aorta at the level of the renal arteries. The renal arteries are patent. There is mild atherosclerotic plaque within the proximal SMA.  The lungs are clear. There is no focal consolidation, pleural effusion or pneumothorax.  There is no axillary, hilar, or mediastinal adenopathy.  There is no lytic or blastic osseous lesion.  There are cholelithiasis.  Review of the MIP images confirms the above findings.  IMPRESSION: 1. Bovine aortic arch. Branch vessels arising from the aortic arch are patent without stenosis. The proximal portions of bilateral carotid arteries and subclavian arteries are patent.   Electronically Signed  By: Kathreen Devoid  On: 04/27/2014 17:39           Assessment / Plan: 1. HTN - blood pressure control is resistant to medical treatment. Recent changes in meds due to unavailability of Bidil and Edarbychlor.  I have made no changes today.  I will be interested to get feedback from her visit with Dr. Florene Glen.   2. CAD - with past CABG - asymptomatic.   3. PVD - with past aortobifem bypass in 2012 - by Dr. Kellie Simmering  4. Palpitations with intermittent racing. Event monitor was unremarkable.   5. Severe aortic atherosclerosis. No obstructive disease or aneurysm. Her pulsatile tinnitus is related to radiated bruits from her vascular disease.   6. Anemia. Chronic.   7. 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 Avoid contrast studies in the future unless absolutely necessary.  I will follow up in 4 months.

## 2014-10-14 ENCOUNTER — Telehealth: Payer: Self-pay | Admitting: Cardiology

## 2014-10-14 NOTE — Telephone Encounter (Signed)
Pt wanted Malachy Mood for appointment for July,July schedule is not ready.

## 2014-11-08 ENCOUNTER — Encounter: Payer: Self-pay | Admitting: Hematology & Oncology

## 2014-11-08 ENCOUNTER — Telehealth: Payer: Self-pay | Admitting: Internal Medicine

## 2014-11-08 NOTE — Telephone Encounter (Signed)
new patient referral-s/w patient and gave np appt for 06/01 @ 11 w/Dr. Julien Nordmann. Referring Dr. Precious Haws  Dx-anemia

## 2014-11-11 ENCOUNTER — Encounter (HOSPITAL_COMMUNITY): Payer: Self-pay | Admitting: Emergency Medicine

## 2014-11-11 ENCOUNTER — Emergency Department (HOSPITAL_COMMUNITY)
Admission: EM | Admit: 2014-11-11 | Discharge: 2014-11-11 | Disposition: A | Discharge status: Home / Self Care | Payer: Medicare Other | Attending: Emergency Medicine | Admitting: Emergency Medicine

## 2014-11-11 DIAGNOSIS — F419 Anxiety disorder, unspecified: Secondary | ICD-10-CM | POA: Insufficient documentation

## 2014-11-11 DIAGNOSIS — R631 Polydipsia: Secondary | ICD-10-CM | POA: Diagnosis not present

## 2014-11-11 DIAGNOSIS — Z79899 Other long term (current) drug therapy: Secondary | ICD-10-CM | POA: Insufficient documentation

## 2014-11-11 DIAGNOSIS — E669 Obesity, unspecified: Secondary | ICD-10-CM | POA: Insufficient documentation

## 2014-11-11 DIAGNOSIS — I2510 Atherosclerotic heart disease of native coronary artery without angina pectoris: Secondary | ICD-10-CM | POA: Insufficient documentation

## 2014-11-11 DIAGNOSIS — Z794 Long term (current) use of insulin: Secondary | ICD-10-CM | POA: Insufficient documentation

## 2014-11-11 DIAGNOSIS — Z7982 Long term (current) use of aspirin: Secondary | ICD-10-CM | POA: Insufficient documentation

## 2014-11-11 DIAGNOSIS — H9319 Tinnitus, unspecified ear: Secondary | ICD-10-CM | POA: Diagnosis present

## 2014-11-11 DIAGNOSIS — Z8739 Personal history of other diseases of the musculoskeletal system and connective tissue: Secondary | ICD-10-CM | POA: Insufficient documentation

## 2014-11-11 DIAGNOSIS — E785 Hyperlipidemia, unspecified: Secondary | ICD-10-CM | POA: Insufficient documentation

## 2014-11-11 DIAGNOSIS — N183 Chronic kidney disease, stage 3 (moderate): Secondary | ICD-10-CM | POA: Insufficient documentation

## 2014-11-11 DIAGNOSIS — Z951 Presence of aortocoronary bypass graft: Secondary | ICD-10-CM | POA: Diagnosis not present

## 2014-11-11 DIAGNOSIS — E119 Type 2 diabetes mellitus without complications: Secondary | ICD-10-CM | POA: Diagnosis not present

## 2014-11-11 DIAGNOSIS — Z87891 Personal history of nicotine dependence: Secondary | ICD-10-CM | POA: Diagnosis not present

## 2014-11-11 DIAGNOSIS — I159 Secondary hypertension, unspecified: Secondary | ICD-10-CM | POA: Insufficient documentation

## 2014-11-11 LAB — CBC WITH DIFFERENTIAL/PLATELET
BASOS ABS: 0 10*3/uL (ref 0.0–0.1)
BASOS PCT: 0 % (ref 0–1)
EOS ABS: 0.2 10*3/uL (ref 0.0–0.7)
Eosinophils Relative: 3 % (ref 0–5)
HCT: 28.6 % — ABNORMAL LOW (ref 36.0–46.0)
Hemoglobin: 9 g/dL — ABNORMAL LOW (ref 12.0–15.0)
Lymphocytes Relative: 22 % (ref 12–46)
Lymphs Abs: 1.3 10*3/uL (ref 0.7–4.0)
MCH: 28.6 pg (ref 26.0–34.0)
MCHC: 31.5 g/dL (ref 30.0–36.0)
MCV: 90.8 fL (ref 78.0–100.0)
MONO ABS: 0.5 10*3/uL (ref 0.1–1.0)
Monocytes Relative: 9 % (ref 3–12)
NEUTROS PCT: 66 % (ref 43–77)
Neutro Abs: 3.9 10*3/uL (ref 1.7–7.7)
PLATELETS: 212 10*3/uL (ref 150–400)
RBC: 3.15 MIL/uL — ABNORMAL LOW (ref 3.87–5.11)
RDW: 15 % (ref 11.5–15.5)
WBC: 5.9 10*3/uL (ref 4.0–10.5)

## 2014-11-11 LAB — COMPREHENSIVE METABOLIC PANEL
ALBUMIN: 2.8 g/dL — AB (ref 3.5–5.0)
ALK PHOS: 65 U/L (ref 38–126)
ALT: 18 U/L (ref 14–54)
AST: 22 U/L (ref 15–41)
Anion gap: 8 (ref 5–15)
BILIRUBIN TOTAL: 0.6 mg/dL (ref 0.3–1.2)
BUN: 33 mg/dL — ABNORMAL HIGH (ref 6–20)
CALCIUM: 8.9 mg/dL (ref 8.9–10.3)
CO2: 23 mmol/L (ref 22–32)
CREATININE: 1.48 mg/dL — AB (ref 0.44–1.00)
Chloride: 112 mmol/L — ABNORMAL HIGH (ref 101–111)
GFR calc non Af Amer: 35 mL/min — ABNORMAL LOW (ref >60)
GFR, EST AFRICAN AMERICAN: 41 mL/min — AB (ref >60)
GLUCOSE: 153 mg/dL — AB (ref 70–99)
Potassium: 4.8 mmol/L (ref 3.5–5.1)
Sodium: 143 mmol/L (ref 135–145)
Total Protein: 5.8 g/dL — ABNORMAL LOW (ref 6.5–8.1)

## 2014-11-11 LAB — I-STAT CHEM 8, ED
BUN: 33 mg/dL — AB (ref 6–20)
CALCIUM ION: 1.23 mmol/L (ref 1.13–1.30)
CHLORIDE: 110 mmol/L (ref 101–111)
Creatinine, Ser: 1.4 mg/dL — ABNORMAL HIGH (ref 0.44–1.00)
GLUCOSE: 152 mg/dL — AB (ref 70–99)
HCT: 28 % — ABNORMAL LOW (ref 36.0–46.0)
Hemoglobin: 9.5 g/dL — ABNORMAL LOW (ref 12.0–15.0)
Potassium: 4.7 mmol/L (ref 3.5–5.1)
Sodium: 143 mmol/L (ref 135–145)
TCO2: 19 mmol/L (ref 0–100)

## 2014-11-11 LAB — I-STAT TROPONIN, ED: TROPONIN I, POC: 0.02 ng/mL (ref 0.00–0.08)

## 2014-11-11 MED ORDER — CLONIDINE HCL 0.2 MG PO TABS
0.2000 mg | ORAL_TABLET | Freq: Once | ORAL | Status: AC
  Administered 2014-11-11: 0.2 mg via ORAL
  Filled 2014-11-11: qty 1

## 2014-11-11 MED ORDER — LOSARTAN POTASSIUM 50 MG PO TABS
100.0000 mg | ORAL_TABLET | Freq: Once | ORAL | Status: AC
  Administered 2014-11-11: 100 mg via ORAL
  Filled 2014-11-11: qty 2

## 2014-11-11 NOTE — ED Notes (Signed)
Pt states she has been diagnosed with pulsative tinnitus and sees a MD for this. She can feel her pulse in her left neck and is upset that there is "no treatment for this." Only new symptom per pt is that her BP is high and it is not normally high.

## 2014-11-11 NOTE — ED Notes (Signed)
Reviewed bp readings and heart rate with Dr. Venora Maples, BP 188/76 manually, and heart rate 51, previously dropped to 48 but increased without interventions. MD acknowledges, allows for discharge.

## 2014-11-11 NOTE — ED Provider Notes (Signed)
CSN: [id number]     Arrival date & time 11/11/14  0108 History  This chart was scribed for Jola Schmidt, MD by Chester Holstein, ED Scribe. This patient was seen in room D34C/D34C and the patient's care was started at 3:31 AM.     Chief Complaint  Patient presents with  . Hypertension  . Tinnitus    Patient is a 69 y.o. female presenting with hypertension. The history is provided by the patient. No language interpreter was used.  Hypertension Pertinent negatives include no headaches.   HPI Comments: Norma [name] is a 69 y.o. female with PMHx of DM, HTN, HLD, DJD, PAD, CAD, carotid artery occlusion, CKD, anemia, and DDD who presents to the Emergency Department complaining of lightheadedness with onset yesterday morning worsening around 11 PM. Pt reports she has been diagnosed with pulsatile tinnitus and notes elevated BP.  Pt is on several BP medications (see medication list below) and has been compliant. She states she checks her BP 2-3 times a day. Pt notes associated dry mouth. Pt states she has eaten well today. Pt denies headache, diarrhea, and changes in urination.  In speaking with the daughter privately the patient's had made these symptoms for over a year.  Blood pressures been difficult to manage as an outpatient.   Past Medical History  Diagnosis Date  . Claudication   . Diabetes mellitus   . Hypertension   . Hyperlipidemia   . DJD (degenerative joint disease)   . PAD (peripheral artery disease)   . Coronary artery disease   . Leg pain   . Carotid artery occlusion   . Obesity   . DDD (degenerative disc disease)   . DJD (degenerative joint disease)   . Shortness of breath     exertion  . Anxiety   . Diabetic coma   . Edema   . CKD (chronic kidney disease) stage 3, GFR 30-59 ml/min 04/18/2014   Past Surgical History  Procedure Laterality Date  . Cardiac catheterization  12/01/2009    EF 65%  . Removal of fibrous cyst from right breast  10+ YEARS  . Retinal detachment  surgery  10+ YEARS    LEFT EYE  . Coronary artery bypass graft  12/08/2009    LIMA GRAFT TO THE DISTAL LAD AND SAPHENOUS VEIN GRAFT TO THE OBTUSE MARGINAL VESSEL  . Transthoracic echocardiogram  12/01/2009    EF 60-65%  . Cardiovascular stress test  11/28/2009    EF 75%  . Aorta - bilateral femoral artery bypass graft  05/25/2011    Procedure: AORTA BIFEMORAL BYPASS GRAFT;  Surgeon: Mal Misty, MD;  Location: [place];  Service: Vascular;  Laterality: N/A;  . Pr vein bypass graft,aorto-fem-pop  05/25/11   Family History  Problem Relation Age of Onset  . Hypertension Mother   . Coronary artery disease Father   . Hypertension Sister   . Cirrhosis Brother   . Kidney disease Daughter    History  Substance Use Topics  . Smoking status: Former Smoker -- 20 years    Types: Cigarettes    Quit date: 07/10/1991  . Smokeless tobacco: Never Used  . Alcohol Use: No   OB History    No data available     Review of Systems  HENT: Positive for tinnitus.   Cardiovascular: Positive for palpitations.  Gastrointestinal: Negative for diarrhea.  Endocrine: Positive for polydipsia.  Genitourinary: Negative for frequency and difficulty urinating.  Neurological: Positive for light-headedness. Negative for headaches.  Allergies  Iohexol and Sulfa antibiotics  Home Medications   Prior to Admission medications   Medication Sig Start Date End Date Taking? Authorizing Provider  acetaminophen (TYLENOL) 325 MG tablet Take 325-650 mg by mouth every 6 (six) hours as needed. For pain    Historical Provider, MD  allopurinol (ZYLOPRIM) 100 MG tablet Take 100 mg by mouth daily as needed. 06/23/14 06/23/15  Historical Provider, MD  aspirin 325 MG tablet Take 325 mg by mouth daily.     Historical Provider, MD  atorvastatin (LIPITOR) 80 MG tablet Take 1 tablet (80 mg total) by mouth daily. 04/20/14   Peter M Martinique, MD  carvedilol (COREG) 12.5 MG tablet Take 1 tablet (12.5 mg total) by mouth 2 (two) times  daily. 09/23/13   Peter M Martinique, MD  chlorthalidone (HYGROTON) 25 MG tablet Take 25 mg by mouth daily. 06/21/14 06/21/15  Historical Provider, MD  Cholecalciferol (VITAMIN D3) 2000 UNITS TABS Take 2,000 Units by mouth 1 day or 1 dose.    Historical Provider, MD  cloNIDine (CATAPRES) 0.1 MG tablet Take 1 tablet (0.1 mg total) by mouth 2 (two) times daily. 05/18/14   Peter M Martinique, MD  Coenzyme Q10 200 MG capsule Take 200 mg by mouth daily.      Historical Provider, MD  furosemide (LASIX) 40 MG tablet Take 40 mg twice a day 05/25/14   Peter M Martinique, MD  hydrALAZINE (APRESOLINE) 25 MG tablet Take 75 mg by mouth 2 (two) times daily.    Historical Provider, MD  HYDROcodone-acetaminophen (NORCO) 7.5-325 MG per tablet Take 1 tablet by mouth 2 (two) times daily as needed. 07/20/14   Historical Provider, MD  insulin glargine (LANTUS) 100 UNIT/ML injection Inject 16 Units into the skin 2 (two) times daily.  09/20/11   Domenic Polite, MD  Liraglutide (VICTOZA [place]) Inject 1.8 Units into the skin daily.     Historical Provider, MD  losartan (COZAAR) 100 MG tablet Take 100 mg by mouth daily. 06/22/14 06/22/15  Historical Provider, MD  Magnesium 250 MG TABS Take 250 mg by mouth daily.    Historical Provider, MD  Probiotic Product (PROBIOTIC DAILY PO) Take 1 tablet by mouth daily.     Historical Provider, MD  spironolactone (ALDACTONE) 25 MG tablet Take 25 mg by mouth daily. 06/07/14   Historical Provider, MD  vitamin B-12 (CYANOCOBALAMIN) 500 MCG tablet Take 500 mcg by mouth daily.    Historical Provider, MD   BP 211/57 mmHg  Pulse 52  Temp(Src) 98.4 F (36.9 C) (Oral)  Resp 14  Ht 5\' 2"  (1.575 m)  Wt 222 lb (100.699 kg)  BMI 40.59 kg/m2  SpO2 96% Physical Exam  Constitutional: She is oriented to person, place, and time. She appears well-developed and well-nourished. No distress.  HENT:  Head: Normocephalic and atraumatic.  Eyes: EOM are normal.  Neck: Normal range of motion.  Cardiovascular: Normal  rate, regular rhythm and normal heart sounds.   Pulmonary/Chest: Effort normal and breath sounds normal.  Abdominal: Soft. She exhibits no distension. There is no tenderness.  Musculoskeletal: Normal range of motion.  Neurological: She is alert and oriented to person, place, and time.  Skin: Skin is warm and dry.  Psychiatric: She has a normal mood and affect. Judgment normal.  Nursing note and vitals reviewed.   ED Course  Procedures (including critical care time) DIAGNOSTIC STUDIES: Oxygen Saturation is 96% on room air, normal by my interpretation.    COORDINATION OF CARE: 3:36 AM Discussed treatment  plan with patient at beside, the patient agrees with the plan and has no further questions at this time.   Labs Review Labs Reviewed  CBC WITH DIFFERENTIAL/PLATELET - Abnormal; Notable for the following:    RBC 3.15 (*)    Hemoglobin 9.0 (*)    HCT 28.6 (*)    All other components within normal limits  COMPREHENSIVE METABOLIC PANEL - Abnormal; Notable for the following:    Chloride 112 (*)    Glucose, Bld 153 (*)    BUN 33 (*)    Creatinine, Ser 1.48 (*)    Total Protein 5.8 (*)    Albumin 2.8 (*)    GFR calc non Af Amer 35 (*)    GFR calc Af Amer 41 (*)    All other components within normal limits  I-STAT CHEM 8, ED - Abnormal; Notable for the following:    BUN 33 (*)    Creatinine, Ser 1.40 (*)    Glucose, Bld 152 (*)    Hemoglobin 9.5 (*)    HCT 28.0 (*)    All other components within normal limits  I-STAT TROPOININ, ED   BUN  Date Value Ref Range Status  11/11/2014 33* 6 - 20 mg/dL Final  11/11/2014 33* 6 - 20 mg/dL Final  06/01/2014 36* 6 - 23 mg/dL Final  05/22/2014 36* 6 - 23 mg/dL Final   CREAT  Date Value Ref Range Status  06/01/2014 1.41* 0.50 - 1.10 mg/dL Final  05/13/2014 1.59* 0.50 - 1.10 mg/dL Final  05/03/2014 3.10* 0.50 - 1.10 mg/dL Final  04/30/2014 3.57* 0.50 - 1.10 mg/dL Final   CREATININE, SER  Date Value Ref Range Status  11/11/2014 1.40*  0.44 - 1.00 mg/dL Final  11/11/2014 1.48* 0.44 - 1.00 mg/dL Final  05/22/2014 1.56* 0.50 - 1.10 mg/dL Final  05/20/2014 1.47* 0.50 - 1.10 mg/dL Final       Imaging Review No results found.   EKG Interpretation   Date/Time:  Thursday Nov 11 2014 01:37:08 EDT Ventricular Rate:  53 PR Interval:  214 QRS Duration: 78 QT Interval:  436 QTC Calculation: 409 R Axis:   88 Text Interpretation:  Sinus bradycardia with 1st degree A-V block T wave  abnormality, consider inferior ischemia Abnormal ECG No significant change  was found Confirmed by Dameion Briles  MD, Dandra Velardi ([id number]) on 11/11/2014 3:44:05 AM      MDM   Final diagnoses:  Secondary hypertension, unspecified   Baseline renal insufficiency.  Elevated blood pressure.  Although she has mild dizziness she has no focal deficits.  I ambulated with her in the emergency department and she ambulated without difficulty.  Her blood pressures been difficult to manage is now palpation.  It seems like she might be trying to control her blood pressure through multiple providers.  I recommended that she just see Dr. Florene Glen for blood pressure.  I personally performed the services described in this documentation, which was scribed in my presence. The recorded information has been reviewed and is accurate.        Jola Schmidt, MD 11/11/14 [phone]

## 2014-11-11 NOTE — ED Notes (Signed)
Attempted to call Dr. Venora Maples in regards to BP.

## 2014-11-11 NOTE — ED Notes (Signed)
Ice provided as requested.

## 2014-11-11 NOTE — ED Notes (Addendum)
Patient with hypertension.  Patient states she has pulsatile tinnitus and she is hearing her heartbeat.  She got a new BP meter today and she has been high in all her readings.  She is not having pain in her head, but she is hearing her pulse.  Patient also having some dizziness with the hypertension.

## 2014-11-11 NOTE — ED Notes (Signed)
Reviewed medications with patient and daughter at the bedside. Explained administration instructions in detail based on prescription bottles present. Also encouraged patient to speak with pharmacist that fills prescriptions in regards to filling a pillbox with administration times. Prepared for discharge.

## 2014-11-11 NOTE — ED Notes (Signed)
Dr. Venora Maples in to speak with the patient.

## 2014-11-11 NOTE — ED Notes (Signed)
Family member concerned about the pts bp and no med given yet

## 2014-12-07 ENCOUNTER — Other Ambulatory Visit: Payer: Self-pay | Admitting: Medical Oncology

## 2014-12-07 DIAGNOSIS — D649 Anemia, unspecified: Secondary | ICD-10-CM

## 2014-12-08 ENCOUNTER — Ambulatory Visit: Payer: Medicare Other

## 2014-12-08 ENCOUNTER — Other Ambulatory Visit: Payer: Self-pay | Admitting: Medical Oncology

## 2014-12-08 ENCOUNTER — Other Ambulatory Visit (HOSPITAL_BASED_OUTPATIENT_CLINIC_OR_DEPARTMENT_OTHER): Payer: Medicare Other

## 2014-12-08 ENCOUNTER — Encounter: Payer: Self-pay | Admitting: Internal Medicine

## 2014-12-08 ENCOUNTER — Telehealth: Payer: Self-pay | Admitting: Internal Medicine

## 2014-12-08 ENCOUNTER — Ambulatory Visit (HOSPITAL_BASED_OUTPATIENT_CLINIC_OR_DEPARTMENT_OTHER): Payer: Medicare Other | Admitting: Internal Medicine

## 2014-12-08 ENCOUNTER — Ambulatory Visit (HOSPITAL_BASED_OUTPATIENT_CLINIC_OR_DEPARTMENT_OTHER): Payer: Medicare Other

## 2014-12-08 VITALS — BP 249/54 | HR 62 | Temp 98.4°F | Resp 18 | Ht 62.0 in | Wt 220.6 lb

## 2014-12-08 DIAGNOSIS — D649 Anemia, unspecified: Secondary | ICD-10-CM

## 2014-12-08 DIAGNOSIS — D539 Nutritional anemia, unspecified: Secondary | ICD-10-CM

## 2014-12-08 LAB — COMPREHENSIVE METABOLIC PANEL (CC13)
ALT: 16 U/L (ref 0–55)
AST: 21 U/L (ref 5–34)
Albumin: 2.7 g/dL — ABNORMAL LOW (ref 3.5–5.0)
Alkaline Phosphatase: 80 U/L (ref 40–150)
Anion Gap: 9 mEq/L (ref 3–11)
BUN: 46.7 mg/dL — AB (ref 7.0–26.0)
CHLORIDE: 113 meq/L — AB (ref 98–109)
CO2: 23 meq/L (ref 22–29)
CREATININE: 1.7 mg/dL — AB (ref 0.6–1.1)
Calcium: 8.6 mg/dL (ref 8.4–10.4)
EGFR: 34 mL/min/{1.73_m2} — ABNORMAL LOW (ref >90)
GLUCOSE: 226 mg/dL — AB (ref 70–140)
Potassium: 5.2 mEq/L — ABNORMAL HIGH (ref 3.5–5.1)
SODIUM: 144 meq/L (ref 136–145)
Total Bilirubin: 0.4 mg/dL (ref 0.20–1.20)
Total Protein: 5.8 g/dL — ABNORMAL LOW (ref 6.4–8.3)

## 2014-12-08 LAB — CBC WITH DIFFERENTIAL/PLATELET
BASO%: 0.8 % (ref 0.0–2.0)
BASOS ABS: 0 10*3/uL (ref 0.0–0.1)
EOS ABS: 0.2 10*3/uL (ref 0.0–0.5)
EOS%: 2.6 % (ref 0.0–7.0)
HCT: 30 % — ABNORMAL LOW (ref 34.8–46.6)
HEMOGLOBIN: 9.7 g/dL — AB (ref 11.6–15.9)
LYMPH%: 16.4 % (ref 14.0–49.7)
MCH: 28.6 pg (ref 25.1–34.0)
MCHC: 32.2 g/dL (ref 31.5–36.0)
MCV: 88.6 fL (ref 79.5–101.0)
MONO#: 0.5 10*3/uL (ref 0.1–0.9)
MONO%: 8.7 % (ref 0.0–14.0)
NEUT#: 4.2 10*3/uL (ref 1.5–6.5)
NEUT%: 71.5 % (ref 38.4–76.8)
Platelets: 237 10*3/uL (ref 145–400)
RBC: 3.39 10*6/uL — AB (ref 3.70–5.45)
RDW: 15.5 % — AB (ref 11.2–14.5)
WBC: 5.9 10*3/uL (ref 3.9–10.3)
lymph#: 1 10*3/uL (ref 0.9–3.3)

## 2014-12-08 LAB — IRON AND TIBC CHCC
%SAT: 24 % (ref 21–57)
Iron: 52 ug/dL (ref 41–142)
TIBC: 223 ug/dL — ABNORMAL LOW (ref 236–444)
UIBC: 170 ug/dL (ref 120–384)

## 2014-12-08 LAB — FERRITIN CHCC: FERRITIN: 179 ng/mL (ref 9–269)

## 2014-12-08 NOTE — Telephone Encounter (Signed)
per pof to sch pt appt-gave pt copy of sch-sent back to lab

## 2014-12-08 NOTE — Progress Notes (Signed)
[facility] [phone]   [phone]  CONSULT NOTE  REFERRING PHYSICIAN: Dr. Precious Haws  REASON FOR CONSULTATION:  69 years old African-American female with persistent anemia.  HPI Norma [name] is a 69 y.o. female with past medical history significant for coronary disease, dyslipidemia, hypertension, chronic kidney disease, aortic stenosis as well as diabetes mellitus. The patient was seen recently by her primary care physician and CBC performed on 10/20/2014 showed hemoglobin of 9.2 and hematocrit 30.1% The patient has normal white blood count of 5.4 and normal platelets count of 232,000. She was started on oral iron tablets over the counter and has been taking this medication for the last 3-4 months with no significant improvement in her condition. The patient continues to complain of fatigue and feeling sleepy. She denied having any significant dizzy spells. She had a colonoscopy in April 2016 under the care of Dr. Michail Sermon and was reported to be normal. She denied having any bleeding issues. She has no bruises or ecchymosis. She lost around 12 pounds in the last few months. The patient denied having any significant nausea or vomiting, diarrhea or constipation. She has no hemorrhoidal bleed.  She denied having any significant chest pain, shortness breath, cough or hemoptysis. Family history significant for mother who is healthy at age 51. The patient doesn't know much about her father's medical history.  She is a widow and has 2 children. She was accompanied by her daughter [name]. The patient works as a Environmental education officer. She has no history of smoking, alcohol or drug abuse.  HPI  Past Medical History  Diagnosis Date  . Claudication   . Diabetes mellitus   . Hypertension   . Hyperlipidemia   . DJD (degenerative joint disease)   . PAD (peripheral artery disease)   . Coronary artery disease   . Leg pain   . Carotid artery occlusion   . Obesity   .  DDD (degenerative disc disease)   . DJD (degenerative joint disease)   . Shortness of breath     exertion  . Anxiety   . Diabetic coma   . Edema   . CKD (chronic kidney disease) stage 3, GFR 30-59 ml/min 04/18/2014    Past Surgical History  Procedure Laterality Date  . Cardiac catheterization  12/01/2009    EF 65%  . Removal of fibrous cyst from right breast  10+ YEARS  . Retinal detachment surgery  10+ YEARS    LEFT EYE  . Coronary artery bypass graft  12/08/2009    LIMA GRAFT TO THE DISTAL LAD AND SAPHENOUS VEIN GRAFT TO THE OBTUSE MARGINAL VESSEL  . Transthoracic echocardiogram  12/01/2009    EF 60-65%  . Cardiovascular stress test  11/28/2009    EF 75%  . Aorta - bilateral femoral artery bypass graft  05/25/2011    Procedure: AORTA BIFEMORAL BYPASS GRAFT;  Surgeon: Mal Misty, MD;  Location: [facility];  Service: Vascular;  Laterality: N/A;  . Pr vein bypass graft,aorto-fem-pop  05/25/11    Family History  Problem Relation Age of Onset  . Hypertension Mother   . Coronary artery disease Father   . Hypertension Sister   . Cirrhosis Brother   . Kidney disease Daughter     Social History History  Substance Use Topics  . Smoking status: Former Smoker -- 20 years    Types: Cigarettes    Quit date: 07/10/1991  . Smokeless tobacco: Never Used  . Alcohol Use: No  Allergies  Allergen Reactions  . Iohexol      Code: RASH, Desc: pt called 1 day post scanning stating that skin was red and "itching all over" some what better but still had symptom.. instructed pt to take benadryl to relieve symptoms,per dr Alvester Chou.if any problems call back/mms, Onset Date: [id number]   . Sulfa Antibiotics Other (See Comments)    unknown    Current Outpatient Prescriptions  Medication Sig Dispense Refill  . acetaminophen (TYLENOL) 325 MG tablet Take 325-650 mg by mouth every 6 (six) hours as needed. For pain    . allopurinol (ZYLOPRIM) 100 MG tablet Take 100 mg by mouth daily as needed.    Marland Kitchen  aspirin 81 MG tablet Take 81 mg by mouth daily.    Marland Kitchen atorvastatin (LIPITOR) 80 MG tablet Take 1 tablet (80 mg total) by mouth daily. 30 tablet 6  . carvedilol (COREG) 12.5 MG tablet Take 1 tablet (12.5 mg total) by mouth 2 (two) times daily. 180 tablet 3  . chlorthalidone (HYGROTON) 25 MG tablet Take 25 mg by mouth daily.    . Cholecalciferol (VITAMIN D3) 2000 UNITS TABS Take 2,000 Units by mouth 1 day or 1 dose.    . cloNIDine (CATAPRES) 0.1 MG tablet Take 1 tablet (0.1 mg total) by mouth 2 (two) times daily. 180 tablet 3  . Coenzyme Q10 200 MG capsule Take 200 mg by mouth daily.      . ferrous sulfate 325 (65 FE) MG tablet Take 325 mg by mouth daily with breakfast.    . furosemide (LASIX) 80 MG tablet Take 80 mg by mouth daily.    . hydrALAZINE (APRESOLINE) 50 MG tablet Take 50 mg by mouth 2 (two) times daily.    Marland Kitchen HYDROcodone-acetaminophen (NORCO) 7.5-325 MG per tablet Take 1 tablet by mouth 2 (two) times daily as needed.    . INSULIN DETEMIR [place] Inject 20 Units into the skin 2 (two) times daily.    . isosorbide dinitrate (ISORDIL) 20 MG tablet Take 20 mg by mouth 2 (two) times daily. Take 2 tablets bid    . losartan (COZAAR) 100 MG tablet Take 100 mg by mouth daily.    . Magnesium 250 MG TABS Take 250 mg by mouth daily.    Marland Kitchen OVER THE COUNTER MEDICATION     . Probiotic Product (PROBIOTIC DAILY PO) Take 1 tablet by mouth daily.     . vitamin B-12 (CYANOCOBALAMIN) 500 MCG tablet Take 500 mcg by mouth daily.    . furosemide (LASIX) 80 MG tablet Take 80 mg by mouth daily.    . Liraglutide (VICTOZA [place]) Inject 1.8 Units into the skin daily.     . traMADol (ULTRAM) 50 MG tablet Take 50 mg by mouth every 6 (six) hours as needed.     No current facility-administered medications for this visit.    Review of Systems  Constitutional: positive for fatigue and weight loss Eyes: negative Ears, nose, mouth, throat, and face: negative Respiratory: negative Cardiovascular: negative Gastrointestinal:  negative Genitourinary:negative Integument/breast: negative Hematologic/lymphatic: negative Musculoskeletal:negative Neurological: negative Behavioral/Psych: negative Endocrine: negative Allergic/Immunologic: negative  Physical Exam  [id number], healthy, no distress, well nourished and well developed SKIN: skin color, texture, turgor are normal, no rashes or significant lesions HEAD: Normocephalic, No masses, lesions, tenderness or abnormalities EYES: normal, PERRLA EARS: External ears normal, Canals clear OROPHARYNX:no exudate, no erythema and lips, buccal mucosa, and tongue normal  NECK: supple, no adenopathy, no JVD LYMPH:  no palpable lymphadenopathy, no hepatosplenomegaly BREAST:not  examined LUNGS: clear to auscultation , and palpation HEART: regular rate & rhythm, no murmurs and no gallops ABDOMEN:abdomen soft, non-tender, obese, normal bowel sounds and no masses or organomegaly BACK: Back symmetric, no curvature., No CVA tenderness EXTREMITIES:no joint deformities, effusion, or inflammation, no edema, no skin discoloration  NEURO: alert & oriented x 3 with fluent speech, no focal motor/sensory deficits  PERFORMANCE STATUS: ECOG 1  LABORATORY DATA: Lab Results  Component Value Date   WBC 5.9 12/08/2014   HGB 9.7* 12/08/2014   HCT 30.0* 12/08/2014   MCV 88.6 12/08/2014   PLT 237 12/08/2014      Chemistry      Component Value Date/Time   NA 144 12/08/2014 1044   NA 143 11/11/2014 0143   K 5.2* 12/08/2014 1044   K 4.7 11/11/2014 0143   CL 110 11/11/2014 0143   CO2 23 12/08/2014 1044   CO2 23 11/11/2014 0130   BUN 46.7* 12/08/2014 1044   BUN 33* 11/11/2014 0143   CREATININE 1.7* 12/08/2014 1044   CREATININE 1.40* 11/11/2014 0143   CREATININE 1.41* 06/01/2014 1510      Component Value Date/Time   CALCIUM 8.6 12/08/2014 1044   CALCIUM 8.9 11/11/2014 0130   ALKPHOS 80 12/08/2014 1044   ALKPHOS 65 11/11/2014 0130   AST 21 12/08/2014 1044   AST 22 11/11/2014  0130   ALT 16 12/08/2014 1044   ALT 18 11/11/2014 0130   BILITOT 0.40 12/08/2014 1044   BILITOT 0.6 11/11/2014 0130       RADIOGRAPHIC STUDIES: No results found.  ASSESSMENT: This is a very pleasant 69 years old African-American female with persistent anemia of unclear etiology suspicious for anemia of chronic disease plus/minus iron deficiency.   PLAN: I had a lengthy discussion with the patient and her daughter today about her current condition and treatment options. I ordered several studies today to evaluate her anemia including repeat CBC, comprehensive metabolic panel, LDH, iron study, ferritin, serum folate, serum vitamin B 12, serum protein electrophoresis as well as erythropoietin level. I recommended for the patient to continue her current treatment with the oral iron tablets for now. I will see her back for follow-up visit in one month for reevaluation and discussion of her treatment options based on the pending lab results The patient was advised to call immediately if she has any concerning symptoms in the interval. The patient voices understanding of current disease status and treatment options and is in agreement with the current care plan.  All questions were answered. The patient knows to call the clinic with any problems, questions or concerns. We can certainly see the patient much sooner if necessary.  Thank you so much for allowing me to participate in the care of [place]. I will continue to follow up the patient with you and assist in her care.  I spent 30 minutes counseling the patient face to face. The total time spent in the appointment was 55 minutes.  Disclaimer: This note was dictated with voice recognition software. Similar sounding words can inadvertently be transcribed and may not be corrected upon review.   Yitzchok Carriger K. December 08, 2014, 1:16 PM

## 2014-12-08 NOTE — Progress Notes (Signed)
Checked in new pt with no financial concerns. °

## 2014-12-10 LAB — PROTEIN ELECTROPHORESIS, SERUM, WITH REFLEX
ALPHA-2-GLOBULIN: 0.8 g/dL (ref 0.5–0.9)
Albumin ELP: 2.6 g/dL — ABNORMAL LOW (ref 3.8–4.8)
Alpha-1-Globulin: 0.5 g/dL — ABNORMAL HIGH (ref 0.2–0.3)
Beta 2: 0.3 g/dL (ref 0.2–0.5)
Beta Globulin: 0.4 g/dL (ref 0.4–0.6)
Gamma Globulin: 0.9 g/dL (ref 0.8–1.7)
Total Protein, Serum Electrophoresis: 5.4 g/dL — ABNORMAL LOW (ref 6.1–8.1)

## 2014-12-10 LAB — ERYTHROPOIETIN: Erythropoietin: 15 m[IU]/mL (ref 2.6–18.5)

## 2014-12-10 LAB — VITAMIN B12: Vitamin B-12: 1282 pg/mL — ABNORMAL HIGH (ref 211–911)

## 2014-12-10 LAB — FOLATE: FOLATE: 17.2 ng/mL

## 2014-12-16 ENCOUNTER — Telehealth: Payer: Self-pay | Admitting: *Deleted

## 2014-12-16 NOTE — Telephone Encounter (Signed)
Pt called requesting lab results and what is the next step? Pt has f/u 7/6 Will review with MD

## 2014-12-16 NOTE — Telephone Encounter (Signed)
Lab results are okay. Follow-up visit as previously scheduled.

## 2014-12-17 NOTE — Telephone Encounter (Signed)
Notified pt Lab results are okay, follow up at scheduled. Pt verbalized understanding.

## 2014-12-21 ENCOUNTER — Telehealth: Payer: Self-pay | Admitting: Internal Medicine

## 2014-12-21 NOTE — Telephone Encounter (Signed)
AM PAL - moved 7/6 appointments to 1:15pm. Left message for patient and mailed schedule.

## 2015-01-12 ENCOUNTER — Ambulatory Visit (HOSPITAL_BASED_OUTPATIENT_CLINIC_OR_DEPARTMENT_OTHER): Payer: Medicare Other | Admitting: Internal Medicine

## 2015-01-12 ENCOUNTER — Other Ambulatory Visit (HOSPITAL_BASED_OUTPATIENT_CLINIC_OR_DEPARTMENT_OTHER): Payer: Medicare Other

## 2015-01-12 ENCOUNTER — Ambulatory Visit: Payer: Medicare Other

## 2015-01-12 ENCOUNTER — Encounter: Payer: Self-pay | Admitting: Internal Medicine

## 2015-01-12 ENCOUNTER — Telehealth: Payer: Self-pay | Admitting: Internal Medicine

## 2015-01-12 VITALS — BP 209/45 | HR 58 | Temp 98.2°F | Resp 19 | Ht 62.0 in | Wt 219.0 lb

## 2015-01-12 DIAGNOSIS — D539 Nutritional anemia, unspecified: Secondary | ICD-10-CM

## 2015-01-12 DIAGNOSIS — D649 Anemia, unspecified: Secondary | ICD-10-CM

## 2015-01-12 LAB — CBC WITH DIFFERENTIAL/PLATELET
BASO%: 0.8 % (ref 0.0–2.0)
Basophils Absolute: 0 10*3/uL (ref 0.0–0.1)
EOS%: 2.8 % (ref 0.0–7.0)
Eosinophils Absolute: 0.2 10*3/uL (ref 0.0–0.5)
HEMATOCRIT: 29.1 % — AB (ref 34.8–46.6)
HGB: 9.3 g/dL — ABNORMAL LOW (ref 11.6–15.9)
LYMPH%: 23.3 % (ref 14.0–49.7)
MCH: 28.5 pg (ref 25.1–34.0)
MCHC: 31.7 g/dL (ref 31.5–36.0)
MCV: 89.8 fL (ref 79.5–101.0)
MONO#: 0.7 10*3/uL (ref 0.1–0.9)
MONO%: 10.9 % (ref 0.0–14.0)
NEUT#: 3.7 10*3/uL (ref 1.5–6.5)
NEUT%: 62.2 % (ref 38.4–76.8)
Platelets: 227 10*3/uL (ref 145–400)
RBC: 3.25 10*6/uL — ABNORMAL LOW (ref 3.70–5.45)
RDW: 15.6 % — AB (ref 11.2–14.5)
WBC: 6 10*3/uL (ref 3.9–10.3)
lymph#: 1.4 10*3/uL (ref 0.9–3.3)

## 2015-01-12 NOTE — Telephone Encounter (Signed)
per pof to sch pt appt-gave pt copy of avs °

## 2015-01-12 NOTE — Progress Notes (Signed)
[facility] [phone]   [phone]  OFFICE PROGRESS NOTE  Bartholome Bill, MD [address] [place] [address]  DIAGNOSIS: Persistent normocytic anemia of unclear etiology questionable to be anemia of chronic disease plus/minus iron deficiency  PRIOR THERAPY: None  CURRENT THERAPY: Over-the-counter ferrous sulfate.  INTERVAL HISTORY: Norma [name] 69 y.o. female returns to the clinic today for follow-up visit accompanied by her daughter. The patient is feeling well today except for the persistent fatigue. She had several studies performed recently for evaluation of her anemia including iron study, ferritin, serum folate, vitamin B-12 level, serum protein electrophoresis, serum erythropoietin. These were all unremarkable except for low normal serum iron. She is here today for reevaluation and discussion of her lab results and recommendation regarding her anemia. She denied having any significant weight loss or night sweats. She denied having any chest pain, shortness breath, cough or hemoptysis. She has no nausea or vomiting.  MEDICAL HISTORY: Past Medical History  Diagnosis Date  . Claudication   . Diabetes mellitus   . Hypertension   . Hyperlipidemia   . DJD (degenerative joint disease)   . PAD (peripheral artery disease)   . Coronary artery disease   . Leg pain   . Carotid artery occlusion   . Obesity   . DDD (degenerative disc disease)   . DJD (degenerative joint disease)   . Shortness of breath     exertion  . Anxiety   . Diabetic coma   . Edema   . CKD (chronic kidney disease) stage 3, GFR 30-59 ml/min 04/18/2014    ALLERGIES:  is allergic to iohexol and sulfa antibiotics.  MEDICATIONS:  Current Outpatient Prescriptions  Medication Sig Dispense Refill  . allopurinol (ZYLOPRIM) 100 MG tablet Take 100 mg by mouth daily as needed.    Marland Kitchen aspirin 81 MG tablet Take 81 mg by mouth daily.     Marland Kitchen atorvastatin (LIPITOR) 80 MG tablet Take 1 tablet (80 mg total) by mouth daily. 30 tablet 6  . carvedilol (COREG) 12.5 MG tablet Take 1 tablet (12.5 mg total) by mouth 2 (two) times daily. 180 tablet 3  . chlorthalidone (HYGROTON) 25 MG tablet Take 25 mg by mouth daily.    . Cholecalciferol (VITAMIN D3) 2000 UNITS TABS Take 2,000 Units by mouth 1 day or 1 dose.    . cloNIDine (CATAPRES) 0.3 MG tablet Take 0.3 mg by mouth 2 (two) times daily.     . Coenzyme Q10 200 MG capsule Take 200 mg by mouth daily.      . ferrous sulfate 325 (65 FE) MG tablet Take 325 mg by mouth daily with breakfast.    . furosemide (LASIX) 80 MG tablet Take 80 mg by mouth daily. Pt take daily as needed    . hydrALAZINE (APRESOLINE) 50 MG tablet Take 50 mg by mouth 2 (two) times daily.    Marland Kitchen HYDROcodone-acetaminophen (NORCO) 7.5-325 MG per tablet Take 1 tablet by mouth 2 (two) times daily as needed.    . INSULIN DETEMIR [place] Inject 20 Units into the skin 2 (two) times daily. Lantus 20 units BID    . isosorbide dinitrate (ISORDIL) 20 MG tablet Take 20 mg by mouth 2 (two) times daily. Take 2 tablets bid    . Liraglutide (VICTOZA [place]) Inject 1.8 Units into the skin daily.     Marland Kitchen losartan (COZAAR) 100 MG tablet Take 100 mg by mouth daily.    Marland Kitchen  Magnesium 250 MG TABS Take 250 mg by mouth daily.    Marland Kitchen OVER THE COUNTER MEDICATION     . Probiotic Product (PROBIOTIC DAILY PO) Take 1 tablet by mouth daily.     Marland Kitchen spironolactone (ALDACTONE) 25 MG tablet     . vitamin B-12 (CYANOCOBALAMIN) 500 MCG tablet Take 500 mcg by mouth daily.    . traMADol (ULTRAM) 50 MG tablet Take 50 mg by mouth every 6 (six) hours as needed.     No current facility-administered medications for this visit.    SURGICAL HISTORY:  Past Surgical History  Procedure Laterality Date  . Cardiac catheterization  12/01/2009    EF 65%  . Removal of fibrous cyst from right breast  10+ YEARS  . Retinal detachment surgery  10+ YEARS    LEFT EYE  . Coronary artery  bypass graft  12/08/2009    LIMA GRAFT TO THE DISTAL LAD AND SAPHENOUS VEIN GRAFT TO THE OBTUSE MARGINAL VESSEL  . Transthoracic echocardiogram  12/01/2009    EF 60-65%  . Cardiovascular stress test  11/28/2009    EF 75%  . Aorta - bilateral femoral artery bypass graft  05/25/2011    Procedure: AORTA BIFEMORAL BYPASS GRAFT;  Surgeon: Mal Misty, MD;  Location: [facility];  Service: Vascular;  Laterality: N/A;  . Pr vein bypass graft,aorto-fem-pop  05/25/11    REVIEW OF SYSTEMS:  A comprehensive review of systems was negative except for: Constitutional: positive for fatigue   PHYSICAL EXAMINATION: General appearance: alert, cooperative, fatigued and no distress Head: Normocephalic, without obvious abnormality, atraumatic Neck: no adenopathy, no JVD, supple, symmetrical, trachea midline and thyroid not enlarged, symmetric, no tenderness/mass/nodules Lymph nodes: Cervical, supraclavicular, and axillary nodes normal. Resp: clear to auscultation bilaterally Back: symmetric, no curvature. ROM normal. No CVA tenderness. Cardio: regular rate and rhythm, S1, S2 normal, no murmur, click, rub or gallop GI: soft, non-tender; bowel sounds normal; no masses,  no organomegaly Extremities: extremities normal, atraumatic, no cyanosis or edema  ECOG PERFORMANCE STATUS: 1 - Symptomatic but completely ambulatory  Blood pressure 209/45, pulse 58, temperature 98.2 F (36.8 C), temperature source Oral, resp. rate 19, height 5' 2"  (1.575 m), weight 219 lb (99.338 kg), SpO2 94 %.  LABORATORY DATA: Lab Results  Component Value Date   WBC 6.0 01/12/2015   HGB 9.3* 01/12/2015   HCT 29.1* 01/12/2015   MCV 89.8 01/12/2015   PLT 227 01/12/2015      Chemistry      Component Value Date/Time   NA 144 12/08/2014 1044   NA 143 11/11/2014 0143   K 5.2* 12/08/2014 1044   K 4.7 11/11/2014 0143   CL 110 11/11/2014 0143   CO2 23 12/08/2014 1044   CO2 23 11/11/2014 0130   BUN 46.7* 12/08/2014 1044   BUN 33*  11/11/2014 0143   CREATININE 1.7* 12/08/2014 1044   CREATININE 1.40* 11/11/2014 0143   CREATININE 1.41* 06/01/2014 1510      Component Value Date/Time   CALCIUM 8.6 12/08/2014 1044   CALCIUM 8.9 11/11/2014 0130   ALKPHOS 80 12/08/2014 1044   ALKPHOS 65 11/11/2014 0130   AST 21 12/08/2014 1044   AST 22 11/11/2014 0130   ALT 16 12/08/2014 1044   ALT 18 11/11/2014 0130   BILITOT 0.40 12/08/2014 1044   BILITOT 0.6 11/11/2014 0130       RADIOGRAPHIC STUDIES: No results found.  ASSESSMENT AND PLAN: This is a very pleasant 69 years old African-American female with persistent anemia  of unclear etiology most likely anemia of chronic disease plus/minus iron deficiency but I cannot rule out myelodysplastic syndrome at this point. I had a lengthy discussion with the patient about her lab results and further recommendation regarding her condition. I recommended for the patient to proceed with the CT-guided bone marrow biopsy and aspirate for evaluation of her disease. I will see her back for follow-up visit in 4 weeks for reevaluation and discussion of the biopsy results. I recommended for the patient to continue on the oral ferrous sulfate for now. She was advised to call immediately if she has any concerning symptoms in the interval. The patient voices understanding of current disease status and treatment options and is in agreement with the current care plan.  All questions were answered. The patient knows to call the clinic with any problems, questions or concerns. We can certainly see the patient much sooner if necessary.  Disclaimer: This note was dictated with voice recognition software. Similar sounding words can inadvertently be transcribed and may not be corrected upon review.

## 2015-01-13 ENCOUNTER — Telehealth: Payer: Self-pay | Admitting: Internal Medicine

## 2015-01-13 NOTE — Telephone Encounter (Signed)
pt called to sched appt....done...pt aware of lab on 7.12

## 2015-01-17 ENCOUNTER — Other Ambulatory Visit: Payer: Self-pay | Admitting: Radiology

## 2015-01-17 ENCOUNTER — Telehealth: Payer: Self-pay | Admitting: *Deleted

## 2015-01-17 ENCOUNTER — Telehealth: Payer: Self-pay | Admitting: Internal Medicine

## 2015-01-17 NOTE — Telephone Encounter (Signed)
Pt called with concerns about upcoming BMBX 7/12. Pt states " myblood pressure is up and down anywhere from [id number] systolic, I'm on 4 different BP medications and don"t want to be put to sleep and not wake up on the table. I am suppose to see Dr Erling Cruz-( [facility]) Wednesday at 42 and Thursday I see Dr. Peter Martinique and want to make sure they know about this before I have the biopsy. I want to know what Dr. Julien Nordmann recommends." Discussed with pt I will call CT and inquire how long pt will be under and will review with MD

## 2015-01-17 NOTE — Telephone Encounter (Signed)
Reviewed pt concern with MD regarding upcoming BMBX Instructed pt the procedure takes a short time with mild anesthesia, to proceed with procedure. If pt wishes to postpone we can reschedule appt for a later date/time. Pt verbalized understanding, advised she will proceed.

## 2015-01-17 NOTE — Telephone Encounter (Signed)
s.w. pt and cx lab....she said she can not get here that early and she has MD visits all the rest of the week....did not want to sched appt

## 2015-01-18 ENCOUNTER — Other Ambulatory Visit: Payer: Medicare Other

## 2015-01-18 ENCOUNTER — Encounter (HOSPITAL_COMMUNITY): Payer: Self-pay

## 2015-01-18 ENCOUNTER — Ambulatory Visit (HOSPITAL_COMMUNITY)
Admission: RE | Admit: 2015-01-18 | Discharge: 2015-01-18 | Disposition: A | Discharge status: Home / Self Care | Payer: Medicare Other | Source: Ambulatory Visit | Attending: Internal Medicine | Admitting: Internal Medicine

## 2015-01-18 DIAGNOSIS — Z87891 Personal history of nicotine dependence: Secondary | ICD-10-CM | POA: Insufficient documentation

## 2015-01-18 DIAGNOSIS — N183 Chronic kidney disease, stage 3 (moderate): Secondary | ICD-10-CM | POA: Insufficient documentation

## 2015-01-18 DIAGNOSIS — Z79899 Other long term (current) drug therapy: Secondary | ICD-10-CM | POA: Insufficient documentation

## 2015-01-18 DIAGNOSIS — Z794 Long term (current) use of insulin: Secondary | ICD-10-CM | POA: Diagnosis not present

## 2015-01-18 DIAGNOSIS — D539 Nutritional anemia, unspecified: Secondary | ICD-10-CM

## 2015-01-18 DIAGNOSIS — D649 Anemia, unspecified: Secondary | ICD-10-CM | POA: Diagnosis present

## 2015-01-18 DIAGNOSIS — I129 Hypertensive chronic kidney disease with stage 1 through stage 4 chronic kidney disease, or unspecified chronic kidney disease: Secondary | ICD-10-CM | POA: Insufficient documentation

## 2015-01-18 DIAGNOSIS — E1122 Type 2 diabetes mellitus with diabetic chronic kidney disease: Secondary | ICD-10-CM | POA: Diagnosis not present

## 2015-01-18 DIAGNOSIS — Z7982 Long term (current) use of aspirin: Secondary | ICD-10-CM | POA: Insufficient documentation

## 2015-01-18 DIAGNOSIS — I2510 Atherosclerotic heart disease of native coronary artery without angina pectoris: Secondary | ICD-10-CM | POA: Insufficient documentation

## 2015-01-18 LAB — CBC
HCT: 29.7 % — ABNORMAL LOW (ref 36.0–46.0)
HEMOGLOBIN: 9.7 g/dL — AB (ref 12.0–15.0)
MCH: 29.4 pg (ref 26.0–34.0)
MCHC: 32.7 g/dL (ref 30.0–36.0)
MCV: 90 fL (ref 78.0–100.0)
Platelets: 253 10*3/uL (ref 150–400)
RBC: 3.3 MIL/uL — ABNORMAL LOW (ref 3.87–5.11)
RDW: 14.7 % (ref 11.5–15.5)
WBC: 4.8 10*3/uL (ref 4.0–10.5)

## 2015-01-18 LAB — BONE MARROW EXAM

## 2015-01-18 LAB — GLUCOSE, CAPILLARY
Glucose-Capillary: 149 mg/dL — ABNORMAL HIGH (ref 65–99)
Glucose-Capillary: 144 mg/dL — ABNORMAL HIGH (ref 65–99)

## 2015-01-18 LAB — APTT: aPTT: 33 seconds (ref 24–37)

## 2015-01-18 LAB — PROTIME-INR
INR: 1.09 (ref 0.00–1.49)
Prothrombin Time: 14.3 seconds (ref 11.6–15.2)

## 2015-01-18 MED ORDER — MIDAZOLAM HCL 2 MG/2ML IJ SOLN
INTRAMUSCULAR | Status: AC
Start: 2015-01-18 — End: 2015-01-18
  Filled 2015-01-18: qty 4

## 2015-01-18 MED ORDER — HYDROCODONE-ACETAMINOPHEN 5-325 MG PO TABS
1.0000 | ORAL_TABLET | ORAL | Status: DC | PRN
  Filled 2015-01-18: qty 2

## 2015-01-18 MED ORDER — MIDAZOLAM HCL 2 MG/2ML IJ SOLN
INTRAMUSCULAR | Status: AC | PRN
  Administered 2015-01-18 (×2): 0.5 mg via INTRAVENOUS

## 2015-01-18 MED ORDER — FENTANYL CITRATE (PF) 100 MCG/2ML IJ SOLN
INTRAMUSCULAR | Status: AC | PRN
  Administered 2015-01-18 (×2): 25 ug via INTRAVENOUS

## 2015-01-18 MED ORDER — FENTANYL CITRATE (PF) 100 MCG/2ML IJ SOLN
INTRAMUSCULAR | Status: AC
  Filled 2015-01-18: qty 2

## 2015-01-18 MED ORDER — CARVEDILOL 12.5 MG PO TABS
12.5000 mg | ORAL_TABLET | Freq: Once | ORAL | Status: DC
  Filled 2015-01-18: qty 1

## 2015-01-18 MED ORDER — SODIUM CHLORIDE 0.9 % IV SOLN
Freq: Once | INTRAVENOUS | Status: AC
  Administered 2015-01-18: 10:00:00 via INTRAVENOUS

## 2015-01-18 NOTE — H&P (Signed)
Chief Complaint: "I'm here for a bone marrow biopsy"  Referring Physician(s): Mohamed,Mohamed  History of Present Illness: Norma [name] is a 69 y.o. female with history of chronic kidney disease, diabetes, coronary artery disease, hypertension and persistent anemia of unclear etiology who presents today for CT-guided bone marrow biopsy for further evaluation.  Past Medical History  Diagnosis Date  . Claudication   . Diabetes mellitus   . Hypertension   . Hyperlipidemia   . DJD (degenerative joint disease)   . PAD (peripheral artery disease)   . Coronary artery disease   . Leg pain   . Carotid artery occlusion   . Obesity   . DDD (degenerative disc disease)   . DJD (degenerative joint disease)   . Shortness of breath     exertion  . Anxiety   . Diabetic coma   . Edema   . CKD (chronic kidney disease) stage 3, GFR 30-59 ml/min 04/18/2014    Past Surgical History  Procedure Laterality Date  . Cardiac catheterization  12/01/2009    EF 65%  . Removal of fibrous cyst from right breast  10+ YEARS  . Retinal detachment surgery  10+ YEARS    LEFT EYE  . Coronary artery bypass graft  12/08/2009    LIMA GRAFT TO THE DISTAL LAD AND SAPHENOUS VEIN GRAFT TO THE OBTUSE MARGINAL VESSEL  . Transthoracic echocardiogram  12/01/2009    EF 60-65%  . Cardiovascular stress test  11/28/2009    EF 75%  . Aorta - bilateral femoral artery bypass graft  05/25/2011    Procedure: AORTA BIFEMORAL BYPASS GRAFT;  Surgeon: Mal Misty, MD;  Location: [facility] OR;  Service: Vascular;  Laterality: N/A;  . Pr vein bypass graft,aorto-fem-pop  05/25/11    Allergies: Iohexol and Sulfa antibiotics  Medications: Prior to Admission medications   Medication Sig Start Date End Date Taking? Authorizing Provider  acetaminophen (TYLENOL) 500 MG tablet Take 500-1,000 mg by mouth every 6 (six) hours as needed for moderate pain.   Yes Historical Provider, MD  allopurinol (ZYLOPRIM) 100 MG tablet Take 100 mg  by mouth daily as needed (gout).  06/23/14 06/23/15 Yes Historical Provider, MD  aspirin 325 MG tablet Take 325 mg by mouth every morning.   Yes Historical Provider, MD  atorvastatin (LIPITOR) 80 MG tablet Take 1 tablet (80 mg total) by mouth daily. Patient taking differently: Take 80 mg by mouth every morning.  04/20/14  Yes Peter M Martinique, MD  carvedilol (COREG) 12.5 MG tablet Take 1 tablet (12.5 mg total) by mouth 2 (two) times daily. 09/23/13  Yes Peter M Martinique, MD  chlorthalidone (HYGROTON) 25 MG tablet Take 25 mg by mouth every morning.  06/21/14 06/21/15 Yes Historical Provider, MD  Cholecalciferol (VITAMIN D3) 2000 UNITS TABS Take 2,000 Units by mouth every morning.    Yes Historical Provider, MD  cloNIDine (CATAPRES) 0.3 MG tablet Take 0.3 mg by mouth 2 (two) times daily.  12/30/14  Yes Historical Provider, MD  Coenzyme Q10 200 MG capsule Take 200 mg by mouth every morning.    Yes Historical Provider, MD  ferrous sulfate 325 (65 FE) MG tablet Take 325 mg by mouth daily with breakfast.   Yes Historical Provider, MD  furosemide (LASIX) 80 MG tablet Take 80 mg by mouth daily as needed for fluid.  09/21/14  Yes Historical Provider, MD  hydrALAZINE (APRESOLINE) 50 MG tablet Take 100 mg by mouth 2 (two) times daily.  11/24/14  Yes  Historical Provider, MD  HYDROcodone-acetaminophen (NORCO) 7.5-325 MG per tablet Take 1 tablet by mouth 2 (two) times daily as needed for moderate pain.  07/20/14  Yes Historical Provider, MD  insulin glargine (LANTUS) 100 UNIT/ML injection Inject 20 Units into the skin 2 (two) times daily.   Yes Historical Provider, MD  isosorbide dinitrate (ISORDIL) 20 MG tablet Take 40 mg by mouth 2 (two) times daily.  11/24/14  Yes Historical Provider, MD  Liraglutide (VICTOZA [place]) Inject 1.8 Units into the skin every morning.    Yes Historical Provider, MD  losartan (COZAAR) 100 MG tablet Take 100 mg by mouth every morning.  06/22/14 06/22/15 Yes Historical Provider, MD  Magnesium 250 MG  TABS Take 250 mg by mouth every morning.    Yes Historical Provider, MD  Probiotic Product (PROBIOTIC DAILY PO) Take 1 tablet by mouth every morning.    Yes Historical Provider, MD  traMADol (ULTRAM) 50 MG tablet Take 50 mg by mouth every 6 (six) hours as needed. 11/22/14  Yes Historical Provider, MD  vitamin B-12 (CYANOCOBALAMIN) 500 MCG tablet Take 500 mcg by mouth every morning.    Yes Historical Provider, MD     Family History  Problem Relation Age of Onset  . Hypertension Mother   . Coronary artery disease Father   . Hypertension Sister   . Cirrhosis Brother   . Kidney disease Daughter     History   Social History  . Marital Status: Single    Spouse Name: N/A  . Number of Children: 2  . Years of Education: N/A   Occupational History  . [employer]     retired   Social History Main Topics  . Smoking status: Former Smoker -- 20 years    Types: Cigarettes    Quit date: 07/10/1991  . Smokeless tobacco: Never Used  . Alcohol Use: No  . Drug Use: No  . Sexual Activity: No   Other Topics Concern  . None   Social History Narrative      Review of Systems   Constitutional: Positive for fatigue. Negative for fever.  Respiratory: Negative for cough and shortness of breath.   Cardiovascular: Negative for chest pain.  Gastrointestinal: Negative for nausea, vomiting, abdominal pain and blood in stool.  Genitourinary: Negative for dysuria and hematuria.  Musculoskeletal: Negative for back pain.  Neurological: Negative for headaches.  Hematological: Does not bruise/bleed easily.    Vital Signs: Blood pressure 145/49, heart rate 50, temperature 98 respirations 18, O2 saturations 96% room air   Physical Exam  Constitutional: She is oriented to person, place, and time. She appears well-developed.  Cardiovascular:  Bradycardic, regular  Pulmonary/Chest: Effort normal and breath sounds normal.  Abdominal: Soft. Bowel sounds are normal. There is no tenderness.  Obese    Musculoskeletal: Normal range of motion.  Neurological: She is alert and oriented to person, place, and time.    Mallampati Score:     Imaging: No results found.  Labs:  CBC:  Recent Labs  05/22/14 1530 11/11/14 0130 11/11/14 0143 12/08/14 1043 01/12/15 1320  WBC 3.8* 5.9  --  5.9 6.0  HGB 8.7* 9.0* 9.5* 9.7* 9.3*  HCT 27.4* 28.6* 28.0* 30.0* 29.1*  PLT 175 212  --  237 227    COAGS: No results for input(s): INR, APTT in the last 8760 hours.  BMP:  Recent Labs  05/20/14 0128 05/22/14 1530 06/01/14 1510 11/11/14 0130 11/11/14 0143 12/08/14 1044  NA 142 139 145 143 143 144  K 4.1 3.7 4.4 4.8 4.7 5.2*  CL 106 103 112 112* 110  --   CO2 22 24 26 23   --  23  GLUCOSE 107* 224* 117* 153* 152* 226*  BUN 34* 36* 36* 33* 33* 46.7*  CALCIUM 9.0 8.8 8.9 8.9  --  8.6  CREATININE 1.47* 1.56* 1.41* 1.48* 1.40* 1.7*  GFRNONAA 35* 33*  --  35*  --   --   GFRAA 41* 38*  --  41*  --   --     LIVER FUNCTION TESTS:  Recent Labs  05/20/14 0128 05/22/14 1530 11/11/14 0130 12/08/14 1044  BILITOT 0.4 0.4 0.6 0.40  AST 33 18 22 21   ALT 18 14 18 16   ALKPHOS 91 86 65 80  PROT 6.3 6.1 5.8* 5.8*  ALBUMIN 2.8* 2.8* 2.8* 2.7*    TUMOR MARKERS: No results for input(s): AFPTM, CEA, CA199, CHROMGRNA in the last 8760 hours.  Assessment and Plan: Norma [name] is a 69 y.o. female with history of persistent anemia of unclear etiology who presents today for CT-guided bone marrow biopsy for further evaluation.Risks and benefits discussed with the patient/daughter including, but not limited to bleeding, infection, damage to adjacent structures or low yield requiring additional tests.All of the patient's questions were answered, patient is agreeable to proceed. Consent signed and in chart.     Signed: D. Rowe Robert 01/18/2015, 9:16 AM   I spent a total of 30 minutes in face to face in clinical consultation, greater than 50% of which was counseling/coordinating care for  CT-guided bone marrow biopsy

## 2015-01-18 NOTE — Discharge Instructions (Signed)
Conscious Sedation, Adult, Care After °Refer to this sheet in the next few weeks. These instructions provide you with information on caring for yourself after your procedure. Your health care provider may also give you more specific instructions. Your treatment has been planned according to current medical practices, but problems sometimes occur. Call your health care provider if you have any problems or questions after your procedure. °WHAT TO EXPECT AFTER THE PROCEDURE  °After your procedure: °· You may feel sleepy, clumsy, and have poor balance for several hours. °· Vomiting may occur if you eat too soon after the procedure. °HOME CARE INSTRUCTIONS °· Do not participate in any activities where you could become injured for at least 24 hours. Do not: °· Drive. °· Swim. °· Ride a bicycle. °· Operate heavy machinery. °· Cook. °· Use power tools. °· Climb ladders. °· Work from a high place. °· Do not make important decisions or sign legal documents until you are improved. °· If you vomit, drink water, juice, or soup when you can drink without vomiting. Make sure you have little or no nausea before eating solid foods. °· Only take over-the-counter or prescription medicines for pain, discomfort, or fever as directed by your health care provider. °· Make sure you and your family fully understand everything about the medicines given to you, including what side effects may occur. °· You should not drink alcohol, take sleeping pills, or take medicines that cause drowsiness for at least 24 hours. °· If you smoke, do not smoke without supervision. °· If you are feeling better, you may resume normal activities 24 hours after you were sedated. °· Keep all appointments with your health care provider. °SEEK MEDICAL CARE IF: °· Your skin is pale or bluish in color. °· You continue to feel nauseous or vomit. °· Your pain is getting worse and is not helped by medicine. °· You have bleeding or swelling. °· You are still sleepy or  feeling clumsy after 24 hours. °SEEK IMMEDIATE MEDICAL CARE IF: °· You develop a rash. °· You have difficulty breathing. °· You develop any type of allergic problem. °· You have a fever. °MAKE SURE YOU: °· Understand these instructions. °· Will watch your condition. °· Will get help right away if you are not doing well or get worse. °Document Released: 04/15/2013 Document Reviewed: 04/15/2013 °ExitCare® Patient Information ©2015 [employer]. This information is not intended to replace advice given to you by your health care provider. Make sure you discuss any questions you have with your health care provider. ° °Bone Marrow Aspiration, Bone Marrow Biopsy °Care After °Read the instructions outlined below and refer to this sheet in the next few weeks. These discharge instructions provide you with general information on caring for yourself after you leave the hospital. Your caregiver may also give you specific instructions. While your treatment has been planned according to the most current medical practices available, unavoidable complications occasionally occur. If you have any problems or questions after discharge, call your caregiver. °FINDING OUT THE RESULTS OF YOUR TEST °Not all test results are available during your visit. If your test results are not back during the visit, make an appointment with your caregiver to find out the results. Do not assume everything is normal if you have not heard from your caregiver or the medical facility. It is important for you to follow up on all of your test results.  °HOME CARE INSTRUCTIONS  °You have had sedation and may be sleepy or dizzy. Your thinking   may not be as clear as usual. For the next 24 hours: °· Only take over-the-counter or prescription medicines for pain, discomfort, and or fever as directed by your caregiver. °· Do not drink alcohol. °· Do not smoke. °· Do not drive. °· Do not make important legal decisions. °· Do not operate heavy machinery. °· Do not  care for small children by yourself. °· Keep your dressing clean and dry. You may replace dressing with a bandage after 24 hours. °· You may take a bath or shower after 24 hours. °· Use an ice pack for 20 minutes every 2 hours while awake for pain as needed. °SEEK MEDICAL CARE IF:  °· There is redness, swelling, or increasing pain at the biopsy site. °· There is pus coming from the biopsy site. °· There is drainage from a biopsy site lasting longer than one day. °· An unexplained oral temperature above 102° F (38.9° C) develops. °SEEK IMMEDIATE MEDICAL CARE IF:  °· You develop a rash. °· You have difficulty breathing. °· You develop any reaction or side effects to medications given. °Document Released: 01/12/2005 Document Revised: 09/17/2011 Document Reviewed: 06/22/2008 °ExitCare® Patient Information ©2015 [employer]. This information is not intended to replace advice given to you by your health care provider. Make sure you discuss any questions you have with your health care provider. ° °

## 2015-01-18 NOTE — Procedures (Signed)
CT guided bone marrow biopsy.  No immediate complication and minimal blood loss.   

## 2015-01-19 ENCOUNTER — Other Ambulatory Visit: Payer: Medicare Other

## 2015-01-20 ENCOUNTER — Ambulatory Visit (INDEPENDENT_AMBULATORY_CARE_PROVIDER_SITE_OTHER): Payer: Medicare Other | Admitting: Cardiology

## 2015-01-20 ENCOUNTER — Encounter: Payer: Self-pay | Admitting: Cardiology

## 2015-01-20 VITALS — BP 120/48 | HR 57 | Ht 62.0 in | Wt 212.9 lb

## 2015-01-20 DIAGNOSIS — N184 Chronic kidney disease, stage 4 (severe): Secondary | ICD-10-CM | POA: Diagnosis not present

## 2015-01-20 DIAGNOSIS — Z9889 Other specified postprocedural states: Secondary | ICD-10-CM

## 2015-01-20 DIAGNOSIS — Z95828 Presence of other vascular implants and grafts: Secondary | ICD-10-CM

## 2015-01-20 DIAGNOSIS — Z794 Long term (current) use of insulin: Secondary | ICD-10-CM

## 2015-01-20 DIAGNOSIS — I739 Peripheral vascular disease, unspecified: Secondary | ICD-10-CM

## 2015-01-20 DIAGNOSIS — I10 Essential (primary) hypertension: Secondary | ICD-10-CM

## 2015-01-20 DIAGNOSIS — R609 Edema, unspecified: Secondary | ICD-10-CM

## 2015-01-20 DIAGNOSIS — I2510 Atherosclerotic heart disease of native coronary artery without angina pectoris: Secondary | ICD-10-CM

## 2015-01-20 DIAGNOSIS — E119 Type 2 diabetes mellitus without complications: Secondary | ICD-10-CM

## 2015-01-20 NOTE — Progress Notes (Signed)
[name] Date of Birth: Jul 15, 1945 Medical Record [id number]  History of Present Illness: Norma [name] is seen for followup CAD and PAD. She has a hsitory of  complex vascular surgery in November of 2012 including aorto-bifemoral bypass for aortic occlusive disease. She had CABG back in June of 2011. She has chronic swelling of her legs from venous insufficiency.  Echo in Feb. 2014  showed a normal EF and moderate PHTN and grade 1 diastolic dysfunction.. She has had some asymptomatic bradycardia - she remains on a lower dose of her Coreg to 12.5 mg BID.  She has refractory HTN.  She has a history of pulsatile tinnitus.  Carotid dopplers in August 2015 showed no obstructive disease. She underwent CT of the aorta which showed no obstructive disease in the aorta or great vessels. There was no aneurysm. Unfortunately she developed contrast induced nephropathy with increased BUN to 85 and creatinine to 3.57. Her renal function later improved.   On follow up today she reports she is doing OK. She is undergoing evaluation by hematology for anemia and had bone marrow bx earlier this week. She is seeing Dr. Florene Glen for her CKD and HTN. BP has been fluctuating but today and when seen by Dr. Florene Glen readings are OK. No chest pain or SOB. Sensation of "dripping" has recurred. Feels occasional "catch" in her heart rhythm but prior event monitor was without arrhythmia.   Current Outpatient Prescriptions  Medication Sig Dispense Refill  . acetaminophen (TYLENOL) 500 MG tablet Take 500-1,000 mg by mouth every 6 (six) hours as needed for moderate pain.    Marland Kitchen allopurinol (ZYLOPRIM) 100 MG tablet Take 100 mg by mouth daily as needed (gout).     Marland Kitchen aspirin 325 MG tablet Take 325 mg by mouth every morning.    Marland Kitchen atorvastatin (LIPITOR) 80 MG tablet Take 1 tablet (80 mg total) by mouth daily. (Patient taking differently: Take 80 mg by mouth every morning. ) 30 tablet 6  . carvedilol (COREG) 12.5 MG tablet Take 1 tablet  (12.5 mg total) by mouth 2 (two) times daily. 180 tablet 3  . chlorthalidone (HYGROTON) 25 MG tablet Take 25 mg by mouth every morning.     . Cholecalciferol (VITAMIN D3) 2000 UNITS TABS Take 2,000 Units by mouth every morning.     . cloNIDine (CATAPRES) 0.3 MG tablet Take 0.3 mg by mouth 2 (two) times daily.     . Coenzyme Q10 200 MG capsule Take 200 mg by mouth every morning.     . ferrous sulfate 325 (65 FE) MG tablet Take 325 mg by mouth daily with breakfast.    . furosemide (LASIX) 80 MG tablet Take 80 mg by mouth daily as needed for fluid.     . hydrALAZINE (APRESOLINE) 50 MG tablet Take 100 mg by mouth 2 (two) times daily.     Marland Kitchen HYDROcodone-acetaminophen (NORCO) 7.5-325 MG per tablet Take 1 tablet by mouth 2 (two) times daily as needed for moderate pain.     Marland Kitchen insulin glargine (LANTUS) 100 UNIT/ML injection Inject 20 Units into the skin 2 (two) times daily.    . isosorbide dinitrate (ISORDIL) 20 MG tablet Take 40 mg by mouth 2 (two) times daily.     . Liraglutide (VICTOZA [place]) Inject 1.8 Units into the skin every morning.     Marland Kitchen losartan (COZAAR) 100 MG tablet Take 100 mg by mouth every morning.     . Magnesium 250 MG TABS Take 250 mg by mouth every  morning.     . Probiotic Product (PROBIOTIC DAILY PO) Take 1 tablet by mouth every morning.     . traMADol (ULTRAM) 50 MG tablet Take 50 mg by mouth every 6 (six) hours as needed.    . vitamin B-12 (CYANOCOBALAMIN) 500 MCG tablet Take 500 mcg by mouth every morning.      No current facility-administered medications for this visit.    Allergies  Allergen Reactions  . Iohexol      Code: RASH, Desc: pt called 1 day post scanning stating that skin was red and "itching all over" some what better but still had symptom.. instructed pt to take benadryl to relieve symptoms,per dr Alvester Chou.if any problems call back/mms, Onset Date: [id number]   . Sulfa Antibiotics Other (See Comments)    unknown    Past Medical History  Diagnosis Date  .  Claudication   . Diabetes mellitus   . Hypertension   . Hyperlipidemia   . DJD (degenerative joint disease)   . PAD (peripheral artery disease)   . Coronary artery disease   . Leg pain   . Carotid artery occlusion   . Obesity   . DDD (degenerative disc disease)   . DJD (degenerative joint disease)   . Shortness of breath     exertion  . Anxiety   . Diabetic coma   . Edema   . CKD (chronic kidney disease) stage 3, GFR 30-59 ml/min 04/18/2014    Past Surgical History  Procedure Laterality Date  . Cardiac catheterization  12/01/2009    EF 65%  . Removal of fibrous cyst from right breast  10+ YEARS  . Retinal detachment surgery  10+ YEARS    LEFT EYE  . Coronary artery bypass graft  12/08/2009    LIMA GRAFT TO THE DISTAL LAD AND SAPHENOUS VEIN GRAFT TO THE OBTUSE MARGINAL VESSEL  . Transthoracic echocardiogram  12/01/2009    EF 60-65%  . Cardiovascular stress test  11/28/2009    EF 75%  . Aorta - bilateral femoral artery bypass graft  05/25/2011    Procedure: AORTA BIFEMORAL BYPASS GRAFT;  Surgeon: Mal Misty, MD;  Location: [facility] OR;  Service: Vascular;  Laterality: N/A;  . Pr vein bypass graft,aorto-fem-pop  05/25/11    History  Smoking status  . Former Smoker -- 20 years  . Types: Cigarettes  . Quit date: 07/10/1991  Smokeless tobacco  . Never Used    History  Alcohol Use No    Family History  Problem Relation Age of Onset  . Hypertension Mother   . Coronary artery disease Father   . Hypertension Sister   . Cirrhosis Brother   . Kidney disease Daughter     Review of Systems: The review of systems is per the HPI.   All other systems were reviewed and are negative.  Physical Exam: BP 120/48 mmHg  Pulse 57  Ht 5\' 2"  (1.575 m)  Wt 96.571 kg (212 lb 14.4 oz)  BMI 38.93 kg/m2 Patient is very pleasant and in no acute distress. She is obese. Skin is warm and dry. Color is normal.  HEENT is unremarkable. Normocephalic/atraumatic. PERRL. Sclera are nonicteric.  Neck is supple. No masses. No JVD. There are bilateral loud bruits R>L at the base of the neck and subclavian areas. Lungs are clear. Cardiac exam shows a regular rate and rhythm. Normal S1-2, no gallop or cardiac murmur. Abdomen is soft. NT, BS +. No masses. Extremities are with trace edema on the left  and 1+ on the right. Gait and ROM are intact. No gross neurologic deficits noted.  LABORATORY DATA:   Lab Results  Component Value Date   WBC 4.8 01/18/2015   HGB 9.7* 01/18/2015   HCT 29.7* 01/18/2015   PLT 253 01/18/2015   GLUCOSE 226* 12/08/2014   CHOL 138 04/30/2014   TRIG 169* 04/30/2014   HDL 29* 04/30/2014   LDLCALC 75 04/30/2014   ALT 16 12/08/2014   AST 21 12/08/2014   NA 144 12/08/2014   K 5.2* 12/08/2014   CL 110 11/11/2014   CREATININE 1.7* 12/08/2014   BUN 46.7* 12/08/2014   CO2 23 12/08/2014   TSH 2.193 04/16/2014   INR 1.09 01/18/2015   HGBA1C 6.1* 06/13/2011     Assessment / Plan: 1. HTN - Blood pressure has been difficult to control but looks good today on multiple medications. Continue current therapy.   I have made no changes today.   2. CAD - with past CABG - asymptomatic.   3. PVD - with past aortobifem bypass in 2012 - by Dr. Kellie Simmering  4. Palpitations. Event monitor was unremarkable.   5. Severe aortic atherosclerosis. No obstructive disease or aneurysm. Her pulsatile tinnitus is related to radiated bruits from her vascular disease.   6. Anemia. Chronic. Awaiting hematology evaluation and bone marrow bx.   7. 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 Avoid contrast studies in the future unless absolutely necessary.  I will follow up in 6 months.

## 2015-01-20 NOTE — Patient Instructions (Signed)
Continue your current therapy  I will see you in 6 months.   

## 2015-01-21 ENCOUNTER — Telehealth: Payer: Self-pay | Admitting: *Deleted

## 2015-01-21 NOTE — Telephone Encounter (Signed)
TC from patient inquiring about the results of bone marrow biopsy she had on Tuesday, July 12th. No results available at this time.Advised patient to call back next week and that it is best to have the physician go over the results, what they mean and any proposed treatment. Pt states she will call back Monday or Tuesday.

## 2015-01-25 ENCOUNTER — Telehealth: Payer: Self-pay

## 2015-01-25 LAB — CHROMOSOME ANALYSIS, BONE MARROW

## 2015-01-25 NOTE — Telephone Encounter (Signed)
Pt requesting results of BMBX done last week.

## 2015-01-25 NOTE — Telephone Encounter (Signed)
Received call from patient requesting Bx results.  Informed patient results are 'in process'.  Verbalized distress of being told last week to call this week and has called several times and would like to know when results will be received.  This nurse apologized for any miscommunication.  Informed her I 'guesstimate results in ten business days on February 01, 2015+/-.  Dr. Julien Nordmann will review the total results.  His impression of these results will be reviewed with her on the next scheduled visit on February 09, 2015.  Thanked me for this information.

## 2015-02-04 ENCOUNTER — Encounter (HOSPITAL_COMMUNITY): Payer: Self-pay

## 2015-02-09 ENCOUNTER — Other Ambulatory Visit (HOSPITAL_BASED_OUTPATIENT_CLINIC_OR_DEPARTMENT_OTHER): Payer: Medicare Other

## 2015-02-09 ENCOUNTER — Encounter: Payer: Self-pay | Admitting: Internal Medicine

## 2015-02-09 ENCOUNTER — Ambulatory Visit (HOSPITAL_BASED_OUTPATIENT_CLINIC_OR_DEPARTMENT_OTHER): Payer: Medicare Other | Admitting: Internal Medicine

## 2015-02-09 ENCOUNTER — Telehealth: Payer: Self-pay | Admitting: Internal Medicine

## 2015-02-09 VITALS — BP 180/35 | HR 59 | Temp 97.9°F | Resp 18 | Ht 62.0 in | Wt 210.5 lb

## 2015-02-09 DIAGNOSIS — D649 Anemia, unspecified: Secondary | ICD-10-CM

## 2015-02-09 DIAGNOSIS — D539 Nutritional anemia, unspecified: Secondary | ICD-10-CM

## 2015-02-09 LAB — CBC WITH DIFFERENTIAL/PLATELET
BASO%: 0.7 % (ref 0.0–2.0)
Basophils Absolute: 0 10*3/uL (ref 0.0–0.1)
EOS%: 2.2 % (ref 0.0–7.0)
Eosinophils Absolute: 0.1 10*3/uL (ref 0.0–0.5)
HEMATOCRIT: 31.4 % — AB (ref 34.8–46.6)
HGB: 10.1 g/dL — ABNORMAL LOW (ref 11.6–15.9)
LYMPH%: 18.9 % (ref 14.0–49.7)
MCH: 28.8 pg (ref 25.1–34.0)
MCHC: 32.1 g/dL (ref 31.5–36.0)
MCV: 89.8 fL (ref 79.5–101.0)
MONO#: 0.4 10*3/uL (ref 0.1–0.9)
MONO%: 6.9 % (ref 0.0–14.0)
NEUT%: 71.3 % (ref 38.4–76.8)
NEUTROS ABS: 4.4 10*3/uL (ref 1.5–6.5)
Platelets: 232 10*3/uL (ref 145–400)
RBC: 3.5 10*6/uL — ABNORMAL LOW (ref 3.70–5.45)
RDW: 14.5 % (ref 11.2–14.5)
WBC: 6.2 10*3/uL (ref 3.9–10.3)
lymph#: 1.2 10*3/uL (ref 0.9–3.3)

## 2015-02-09 NOTE — Telephone Encounter (Signed)
Gave and printed appts ched and avs for pt for DEC  °

## 2015-02-09 NOTE — Progress Notes (Signed)
[place] [phone]   [phone]  OFFICE PROGRESS NOTE  Bartholome Bill, MD [address] [place] [address]  DIAGNOSIS: Persistent normocytic anemia of unclear etiology questionable to be anemia of chronic disease plus/minus iron deficiency  PRIOR THERAPY: None  CURRENT THERAPY: Over-the-counter ferrous sulfate.  INTERVAL HISTORY: Norma [name] 69 y.o. female returns to the clinic today for follow-up visit. The patient is feeling well today except for the persistent mild fatigue. She denied having any significant weight loss or night sweats. She denied having any chest pain, shortness of breath, cough or hemoptysis. She has no nausea or vomiting. She had a bone marrow biopsy and aspirate performed recently and CBC are clear today and she is here for evaluation and discussion of her biopsy and lab results.  MEDICAL HISTORY: Past Medical History  Diagnosis Date  . Claudication   . Diabetes mellitus   . Hypertension   . Hyperlipidemia   . DJD (degenerative joint disease)   . PAD (peripheral artery disease)   . Coronary artery disease   . Leg pain   . Carotid artery occlusion   . Obesity   . DDD (degenerative disc disease)   . DJD (degenerative joint disease)   . Shortness of breath     exertion  . Anxiety   . Diabetic coma   . Edema   . CKD (chronic kidney disease) stage 3, GFR 30-59 ml/min 04/18/2014    ALLERGIES:  is allergic to iohexol and sulfa antibiotics.  MEDICATIONS:  Current Outpatient Prescriptions  Medication Sig Dispense Refill  . acetaminophen (TYLENOL) 500 MG tablet Take 500-1,000 mg by mouth every 6 (six) hours as needed for moderate pain.    Marland Kitchen allopurinol (ZYLOPRIM) 100 MG tablet Take 100 mg by mouth daily as needed (gout).     Marland Kitchen aspirin 325 MG tablet Take 325 mg by mouth every morning.    Marland Kitchen atorvastatin (LIPITOR) 80 MG tablet Take 1 tablet (80 mg total) by mouth  daily. (Patient taking differently: Take 80 mg by mouth every morning. ) 30 tablet 6  . carvedilol (COREG) 12.5 MG tablet Take 1 tablet (12.5 mg total) by mouth 2 (two) times daily. 180 tablet 3  . chlorthalidone (HYGROTON) 25 MG tablet Take 25 mg by mouth every morning.     . Cholecalciferol (VITAMIN D3) 2000 UNITS TABS Take 2,000 Units by mouth every morning.     . cloNIDine (CATAPRES) 0.3 MG tablet Take 0.3 mg by mouth 2 (two) times daily.     . Coenzyme Q10 200 MG capsule Take 200 mg by mouth every morning.     . ferrous sulfate 325 (65 FE) MG tablet Take 325 mg by mouth daily with breakfast.    . furosemide (LASIX) 80 MG tablet Take 80 mg by mouth daily as needed for fluid.     . hydrALAZINE (APRESOLINE) 50 MG tablet Take 100 mg by mouth 2 (two) times daily.     Marland Kitchen HYDROcodone-acetaminophen (NORCO) 7.5-325 MG per tablet Take 1 tablet by mouth 2 (two) times daily as needed for moderate pain.     Marland Kitchen insulin glargine (LANTUS) 100 UNIT/ML injection Inject 20 Units into the skin 2 (two) times daily.    . isosorbide dinitrate (ISORDIL) 20 MG tablet Take 40 mg by mouth 2 (two) times daily.     . Liraglutide (VICTOZA [place]) Inject 1.8 Units into the skin every morning.     Marland Kitchen  losartan (COZAAR) 100 MG tablet Take 100 mg by mouth every morning.     . Magnesium 250 MG TABS Take 250 mg by mouth every morning.     . Probiotic Product (PROBIOTIC DAILY PO) Take 1 tablet by mouth every morning.     . traMADol (ULTRAM) 50 MG tablet Take 50 mg by mouth every 6 (six) hours as needed.    . vitamin B-12 (CYANOCOBALAMIN) 500 MCG tablet Take 500 mcg by mouth every morning.      No current facility-administered medications for this visit.    SURGICAL HISTORY:  Past Surgical History  Procedure Laterality Date  . Cardiac catheterization  12/01/2009    EF 65%  . Removal of fibrous cyst from right breast  10+ YEARS  . Retinal detachment surgery  10+ YEARS    LEFT EYE  . Coronary artery bypass graft  12/08/2009     LIMA GRAFT TO THE DISTAL LAD AND SAPHENOUS VEIN GRAFT TO THE OBTUSE MARGINAL VESSEL  . Transthoracic echocardiogram  12/01/2009    EF 60-65%  . Cardiovascular stress test  11/28/2009    EF 75%  . Aorta - bilateral femoral artery bypass graft  05/25/2011    Procedure: AORTA BIFEMORAL BYPASS GRAFT;  Surgeon: Mal Misty, MD;  Location: [facility];  Service: Vascular;  Laterality: N/A;  . Pr vein bypass graft,aorto-fem-pop  05/25/11    REVIEW OF SYSTEMS:  A comprehensive review of systems was negative except for: Constitutional: positive for fatigue   PHYSICAL EXAMINATION: General appearance: alert, cooperative, fatigued and no distress Head: Normocephalic, without obvious abnormality, atraumatic Neck: no adenopathy, no JVD, supple, symmetrical, trachea midline and thyroid not enlarged, symmetric, no tenderness/mass/nodules Lymph nodes: Cervical, supraclavicular, and axillary nodes normal. Resp: clear to auscultation bilaterally Back: symmetric, no curvature. ROM normal. No CVA tenderness. Cardio: regular rate and rhythm, S1, S2 normal, no murmur, click, rub or gallop GI: soft, non-tender; bowel sounds normal; no masses,  no organomegaly Extremities: extremities normal, atraumatic, no cyanosis or edema  ECOG PERFORMANCE STATUS: 1 - Symptomatic but completely ambulatory  Blood pressure 180/35, pulse 59, temperature 97.9 F (36.6 C), temperature source Oral, resp. rate 18, height 5' 2"  (1.575 m), weight 210 lb 8 oz (95.482 kg), SpO2 97 %.  LABORATORY DATA: Lab Results  Component Value Date   WBC 6.2 02/09/2015   HGB 10.1* 02/09/2015   HCT 31.4* 02/09/2015   MCV 89.8 02/09/2015   PLT 232 02/09/2015      Chemistry      Component Value Date/Time   NA 144 12/08/2014 1044   NA 143 11/11/2014 0143   K 5.2* 12/08/2014 1044   K 4.7 11/11/2014 0143   CL 110 11/11/2014 0143   CO2 23 12/08/2014 1044   CO2 23 11/11/2014 0130   BUN 46.7* 12/08/2014 1044   BUN 33* 11/11/2014 0143    CREATININE 1.7* 12/08/2014 1044   CREATININE 1.40* 11/11/2014 0143   CREATININE 1.41* 06/01/2014 1510      Component Value Date/Time   CALCIUM 8.6 12/08/2014 1044   CALCIUM 8.9 11/11/2014 0130   ALKPHOS 80 12/08/2014 1044   ALKPHOS 65 11/11/2014 0130   AST 21 12/08/2014 1044   AST 22 11/11/2014 0130   ALT 16 12/08/2014 1044   ALT 18 11/11/2014 0130   BILITOT 0.40 12/08/2014 1044   BILITOT 0.6 11/11/2014 0130       RADIOGRAPHIC STUDIES: Ct Biopsy  01/18/2015   CLINICAL DATA:  69 year old with deficiency anemia.  EXAM:  CT GUIDED BONE MARROW ASPIRATES AND BIOPSY  Physician: Stephan Minister. Anselm Pancoast, MD  MEDICATIONS: 1 mg versed, 50 mcg fentanyl. A radiology nurse monitored the patient for moderate sedation.  ANESTHESIA/SEDATION: Sedation time: 14 minutes  PROCEDURE: The procedure was explained to the patient. The risks and benefits of the procedure were discussed and the patient's questions were addressed. Informed consent was obtained from the patient. The patient was placed prone on CT scan table. Images of the pelvis were obtained. The right side of back was prepped and draped in sterile fashion. The skin and right posterior iliac bone were anesthetized with 1% lidocaine. 11 gauge bone needle was directed into the right iliac bone with CT guidance. Two aspirates and one core biopsy obtained. Bandage placed over the puncture site.  FINDINGS: Needle directed into the posterior right iliac bone.  Estimated blood loss: Minimal  COMPLICATIONS: None  IMPRESSION: CT guided bone marrow aspirates and core biopsy.   Electronically Signed   By: Markus Daft M.D.   On: 01/18/2015 11:43   Patient: SHERRAL, DIROCCO Collected: 01/18/2015 Client: [facility] Accession: [id number] Received: 01/18/2015 Markus Daft DOB: 1946/04/17 Age: 2 Gender: F Reported: 01/19/2015 [address] Patient Ph: [phone] MRN #: [id number] [place], [place] [address] Visit #: [id number] Chart #: Phone: [phone] Fax: CC:  Curt Bears, MD BONE MARROW REPORT FINAL DIAGNOSIS Diagnosis Bone Marrow, Aspirate,Biopsy, and Clot, right iliac - HYPERCELLULAR BONE MARROW FOR AGE WITH TRILINEAGE HEMATOPOIESIS. - SEE COMMENT. PERIPHERAL BLOOD: - NORMOCYTIC-NORMOCHROMIC ANEMIA. Diagnosis Note The bone marrow is hypercellular for age with trilineage hematopoiesis and nonspecific morphologic changes. Significant dyspoiesis is not seen, and no increase in blastic cells is identified. Correlation with cytogenetic studies is recommended. (BNS:ds 01/19/15) Susanne Greenhouse MD Pathologist, Electronic Signature (Case signed 01/19/2015)  ASSESSMENT AND PLAN: This is a very pleasant 69 years old African-American female with persistent anemia of unclear etiology most likely anemia of chronic disease plus/minus iron deficiency The patient bone marrow biopsy and aspirate showed no concerning findings. I discussed the biopsy results with the patient today. She also has mild improvement in her hemoglobin and hematocrit after starting the oral iron tablets. I recommended for the patient to continue on the oral ferrous sulfate for now. She would come back for follow-up visit in 4 months for reevaluation with repeat CBC, iron study and ferritin. She was advised to call immediately if she has any concerning symptoms in the interval. The patient voices understanding of current disease status and treatment options and is in agreement with the current care plan.  All questions were answered. The patient knows to call the clinic with any problems, questions or concerns. We can certainly see the patient much sooner if necessary.  Disclaimer: This note was dictated with voice recognition software. Similar sounding words can inadvertently be transcribed and may not be corrected upon review.

## 2015-04-11 ENCOUNTER — Other Ambulatory Visit: Payer: Self-pay

## 2015-04-11 DIAGNOSIS — Z1231 Encounter for screening mammogram for malignant neoplasm of breast: Secondary | ICD-10-CM

## 2015-05-19 ENCOUNTER — Ambulatory Visit
Admission: RE | Admit: 2015-05-19 | Discharge: 2015-05-19 | Disposition: A | Discharge status: Home / Self Care | Payer: Medicare Other | Source: Ambulatory Visit

## 2015-05-19 DIAGNOSIS — Z1231 Encounter for screening mammogram for malignant neoplasm of breast: Secondary | ICD-10-CM

## 2015-06-16 ENCOUNTER — Other Ambulatory Visit: Payer: Medicare Other

## 2015-06-23 ENCOUNTER — Ambulatory Visit: Payer: Medicare Other | Admitting: Internal Medicine

## 2015-07-08 ENCOUNTER — Telehealth: Payer: Self-pay | Admitting: Internal Medicine

## 2015-07-08 NOTE — Telephone Encounter (Signed)
Patient left message on voicemail yesterday to cx 1/4 lab and 1/11 f/u due to she will be out of town. Per patient she will call to r/s upon return.

## 2015-07-13 ENCOUNTER — Other Ambulatory Visit: Payer: Medicare Other

## 2015-07-20 ENCOUNTER — Ambulatory Visit: Payer: Medicare Other | Admitting: Internal Medicine

## 2015-08-08 ENCOUNTER — Ambulatory Visit (INDEPENDENT_AMBULATORY_CARE_PROVIDER_SITE_OTHER): Payer: Medicare Other | Admitting: Cardiology

## 2015-08-08 ENCOUNTER — Encounter: Payer: Self-pay | Admitting: Cardiology

## 2015-08-08 VITALS — BP 164/70 | HR 68 | Ht 62.0 in | Wt 215.0 lb

## 2015-08-08 DIAGNOSIS — I2510 Atherosclerotic heart disease of native coronary artery without angina pectoris: Secondary | ICD-10-CM

## 2015-08-08 DIAGNOSIS — N184 Chronic kidney disease, stage 4 (severe): Secondary | ICD-10-CM | POA: Diagnosis not present

## 2015-08-08 DIAGNOSIS — I10 Essential (primary) hypertension: Secondary | ICD-10-CM | POA: Diagnosis not present

## 2015-08-08 DIAGNOSIS — Z95828 Presence of other vascular implants and grafts: Secondary | ICD-10-CM | POA: Diagnosis not present

## 2015-08-08 NOTE — Progress Notes (Signed)
Norma [name] Date of Birth: 02/08/1946 Medical Record #[id number]  History of Present Illness: Norma [name] is seen for followup CAD and PAD. She has a hsitory of  complex vascular surgery in November of 2012 including aorto-bifemoral bypass for aortic occlusive disease. She had CABG back in June of 2011. She has chronic swelling of her legs from venous insufficiency.  Echo in Feb. 2014  showed a normal EF and moderate PHTN and grade 1 diastolic dysfunction.. She has a history of bradycardia - she remains on a lower dose of her Coreg to 12.5 mg BID.  She has refractory HTN.  She has a history of pulsatile tinnitus.  Carotid dopplers in August 2015 showed no obstructive disease. She underwent CT of the aorta which showed no obstructive disease in the aorta or great vessels. There was no aneurysm. Unfortunately she developed contrast induced nephropathy with increased BUN to 85 and creatinine to 3.57. Her renal function later improved.   On follow up today she reports she is doing OK. She had a bone marrow biopsy for evaluation of anemia and this was OK. Now on iron therapy. Reports blood pressure is better. Didn't take lasix yesterday or today so she has some increased swelling. Major complaint today is of a crick in her neck. Still feels the "dripping" sensation that is chronic and also has a "cold wave" all over her body.  Current Outpatient Prescriptions  Medication Sig Dispense Refill  . acetaminophen (TYLENOL) 500 MG tablet Take 500-1,000 mg by mouth every 6 (six) hours as needed for moderate pain.    Marland Kitchen aspirin 325 MG tablet Take 325 mg by mouth every morning.    Marland Kitchen atorvastatin (LIPITOR) 80 MG tablet Take 1 tablet (80 mg total) by mouth daily. (Patient taking differently: Take 80 mg by mouth every morning. ) 30 tablet 6  . carvedilol (COREG) 12.5 MG tablet Take 1 tablet (12.5 mg total) by mouth 2 (two) times daily. 180 tablet 3  . Cholecalciferol (VITAMIN D3) 2000 UNITS TABS Take 2,000 Units by  mouth every morning.     . cloNIDine (CATAPRES) 0.3 MG tablet Take 0.3 mg by mouth 2 (two) times daily.     . Coenzyme Q10 200 MG capsule Take 200 mg by mouth every morning.     . ferrous sulfate 325 (65 FE) MG tablet Take 325 mg by mouth daily with breakfast.    . furosemide (LASIX) 80 MG tablet Take 80 mg by mouth daily as needed for fluid.     . hydrALAZINE (APRESOLINE) 50 MG tablet Take 50 mg by mouth 3 (three) times daily.    Marland Kitchen HYDROcodone-acetaminophen (NORCO) 7.5-325 MG per tablet Take 1 tablet by mouth 2 (two) times daily as needed for moderate pain.     Marland Kitchen insulin glargine (LANTUS) 100 UNIT/ML injection Inject 20 Units into the skin 2 (two) times daily.    . isosorbide dinitrate (ISORDIL) 20 MG tablet Take 40 mg by mouth 2 (two) times daily.     . Liraglutide (VICTOZA [place]) Inject 1.8 Units into the skin every morning.     . Magnesium 250 MG TABS Take 250 mg by mouth every morning.     . Probiotic Product (PROBIOTIC DAILY PO) Take 1 tablet by mouth every morning.     . traMADol (ULTRAM) 50 MG tablet Take 50 mg by mouth every 6 (six) hours as needed.    . vitamin B-12 (CYANOCOBALAMIN) 500 MCG tablet Take 500 mcg by mouth every morning.     Marland Kitchen  allopurinol (ZYLOPRIM) 100 MG tablet Take 100 mg by mouth daily as needed (gout).     . chlorthalidone (HYGROTON) 25 MG tablet Take 25 mg by mouth every morning.     Marland Kitchen losartan (COZAAR) 100 MG tablet Take 100 mg by mouth every morning.      No current facility-administered medications for this visit.    Allergies  Allergen Reactions  . Iohexol      Code: RASH, Desc: pt called 1 day post scanning stating that skin was red and "itching all over" some what better but still had symptom.. instructed pt to take benadryl to relieve symptoms,per dr Alvester Chou.if any problems call back/mms, Onset Date: [id number]   . Sulfa Antibiotics Other (See Comments)    unknown    Past Medical History  Diagnosis Date  . Claudication ([place])   . Diabetes mellitus   .  Hypertension   . Hyperlipidemia   . DJD (degenerative joint disease)   . PAD (peripheral artery disease) ([place])   . Coronary artery disease   . Leg pain   . Carotid artery occlusion   . Obesity   . DDD (degenerative disc disease)   . DJD (degenerative joint disease)   . Shortness of breath     exertion  . Anxiety   . Diabetic coma ([place])   . Edema   . CKD (chronic kidney disease) stage 3, GFR 30-59 ml/min 04/18/2014    Past Surgical History  Procedure Laterality Date  . Cardiac catheterization  12/01/2009    EF 65%  . Removal of fibrous cyst from right breast  10+ YEARS  . Retinal detachment surgery  10+ YEARS    LEFT EYE  . Coronary artery bypass graft  12/08/2009    LIMA GRAFT TO THE DISTAL LAD AND SAPHENOUS VEIN GRAFT TO THE OBTUSE MARGINAL VESSEL  . Transthoracic echocardiogram  12/01/2009    EF 60-65%  . Cardiovascular stress test  11/28/2009    EF 75%  . Aorta - bilateral femoral artery bypass graft  05/25/2011    Procedure: AORTA BIFEMORAL BYPASS GRAFT;  Surgeon: Mal Misty, MD;  Location: [facility] OR;  Service: Vascular;  Laterality: N/A;  . Pr vein bypass graft,aorto-fem-pop  05/25/11    History  Smoking status  . Former Smoker -- 20 years  . Types: Cigarettes  . Quit date: 07/10/1991  Smokeless tobacco  . Never Used    History  Alcohol Use No    Family History  Problem Relation Age of Onset  . Hypertension Mother   . Coronary artery disease Father   . Hypertension Sister   . Cirrhosis Brother   . Kidney disease Daughter     Review of Systems: The review of systems is per the HPI.   All other systems were reviewed and are negative.  Physical Exam: BP 164/70 mmHg  Pulse 68  Ht 5' 2"  (1.575 m)  Wt 97.523 kg (215 lb)  BMI 39.31 kg/m2 Patient is very pleasant and in no acute distress. She is obese. Skin is warm and dry. Color is normal.  HEENT is unremarkable. Normocephalic/atraumatic. PERRL. Sclera are nonicteric. Neck is supple. No masses. No JVD.  There are bilateral loud bruits R>L at the base of the neck and subclavian areas. Lungs are clear. Cardiac exam shows a regular rate and rhythm. Normal S1-2, no gallop or cardiac murmur. Abdomen is soft. NT, BS +. No masses. Extremities are with 1+ edema. Gait and ROM are intact. No gross neurologic deficits noted.  LABORATORY DATA:   Lab Results  Component Value Date   WBC 6.2 02/09/2015   HGB 10.1* 02/09/2015   HCT 31.4* 02/09/2015   PLT 232 02/09/2015   GLUCOSE 226* 12/08/2014   CHOL 138 04/30/2014   TRIG 169* 04/30/2014   HDL 29* 04/30/2014   LDLCALC 75 04/30/2014   ALT 16 12/08/2014   AST 21 12/08/2014   NA 144 12/08/2014   K 5.2* 12/08/2014   CL 110 11/11/2014   CREATININE 1.7* 12/08/2014   BUN 46.7* 12/08/2014   CO2 23 12/08/2014   TSH 2.193 04/16/2014   INR 1.09 01/18/2015   HGBA1C 6.1* 06/13/2011   Labs reviewed from Primary care 02/16/15: Hgb 10.1, BUN 40, creatinine 1.2. A1c 8.9%. Cholesterol 104, triglycerides 178, HDL 36.   Assessment / Plan: 1. HTN - Blood pressure has been difficult to control but looks fairly reasonable  today on multiple medications. Continue current therapy.   I have made no changes today.   2. CAD - with past CABG - asymptomatic.   3. PVD - with past aortobifem bypass in 2012 - by Dr. Kellie Simmering  4. Palpitations. Event monitor was unremarkable.   5. Severe aortic atherosclerosis. No obstructive disease or aneurysm. Her pulsatile tinnitus is related to radiated bruits from her vascular disease.   6. Anemia. Chronic. Partly related to iron deficiency.  7. 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 Avoid contrast studies in the future unless absolutely necessary.  I will follow up in 6 months.

## 2015-08-08 NOTE — Patient Instructions (Signed)
Continue your current therapy  I will see you in 6 months.   

## 2015-08-11 ENCOUNTER — Telehealth: Payer: Self-pay | Admitting: Internal Medicine

## 2015-08-11 NOTE — Telephone Encounter (Signed)
Patient called to r/s her January appointments. Patient given new appointments for lab/fu - lab 2/27 and f/u 3/6.

## 2015-09-01 ENCOUNTER — Telehealth: Payer: Self-pay | Admitting: Internal Medicine

## 2015-09-01 NOTE — Telephone Encounter (Signed)
pt cxl appt due to having the flu, will r/s late next week if feeling better. Left MD appt just in case she is feeling better by then

## 2015-09-05 ENCOUNTER — Other Ambulatory Visit: Payer: Medicare Other

## 2015-09-09 ENCOUNTER — Telehealth: Payer: Self-pay | Admitting: Internal Medicine

## 2015-09-09 NOTE — Telephone Encounter (Signed)
pt has flu and wanted to cx all appts....she will call back to r/s

## 2015-09-12 ENCOUNTER — Ambulatory Visit: Payer: Medicare Other | Admitting: Internal Medicine

## 2015-10-03 ENCOUNTER — Telehealth: Payer: Self-pay | Admitting: Internal Medicine

## 2015-10-03 NOTE — Telephone Encounter (Signed)
pt called to r/s missed appt....done ....pt ok and aware of new d.t °

## 2015-10-07 ENCOUNTER — Telehealth: Payer: Self-pay | Admitting: Cardiology

## 2015-10-07 NOTE — Telephone Encounter (Signed)
New message   Pt req a call back to discuss the irregularities in her heart. She feels like heart gets caught up in a knot it stops and then it starts pumping again.  Pt declined seeing a PA. Please call back to discuss

## 2015-10-07 NOTE — Telephone Encounter (Signed)
Returned call to patient.She stated this past week she has been having racing heart,palpitations,dizziness,elevated B/P,sob with exertion.Stated she feels ok at present.Appointment scheduled with Rosaria Ferries PA Thurs 10/13/15 at 3:00 pm.Advised to go to ER if needed.

## 2015-10-07 NOTE — Telephone Encounter (Signed)
SPOKE TO PATIENT SHE STATES SHE WAS TO CALL THIS MONTH TO SET UP APPOINTMENT.  SHE ALSO HAS BEEN HAVING THE SENSATION THAT SHE MENTIONED BELOW.   INFORMED PATIENT THAT July SCHEDULE IS NOT AVAILABLE YET , BUT WILL DEFER TO [facility]   TO SEE IF PATIENT NEED TO COME SOONER. PER PATIENT REQUEST.

## 2015-10-13 ENCOUNTER — Encounter: Payer: Self-pay | Admitting: Physician Assistant

## 2015-10-13 ENCOUNTER — Ambulatory Visit (INDEPENDENT_AMBULATORY_CARE_PROVIDER_SITE_OTHER): Payer: Medicare Other | Admitting: Physician Assistant

## 2015-10-13 ENCOUNTER — Ambulatory Visit (INDEPENDENT_AMBULATORY_CARE_PROVIDER_SITE_OTHER): Payer: Medicare Other

## 2015-10-13 VITALS — BP 130/50 | HR 47 | Ht 62.0 in | Wt 218.0 lb

## 2015-10-13 DIAGNOSIS — I10 Essential (primary) hypertension: Secondary | ICD-10-CM | POA: Diagnosis not present

## 2015-10-13 DIAGNOSIS — I2510 Atherosclerotic heart disease of native coronary artery without angina pectoris: Secondary | ICD-10-CM

## 2015-10-13 DIAGNOSIS — R002 Palpitations: Secondary | ICD-10-CM

## 2015-10-13 NOTE — Patient Instructions (Addendum)
Medication Instructions:  Your physician has recommended you make the following change in your medication:  1.  DECREASE the Isosorbide Dinitrate to 1 tablet daily  Labwork: NONE ORDERED  Testing/Procedures: Your physician has recommended that you wear an event monitor. Event monitors are medical devices that record the heart's electrical activity. Doctors most often [place] these monitors to diagnose arrhythmias. Arrhythmias are problems with the speed or rhythm of the heartbeat. The monitor is a small, portable device. You can wear one while you do your normal daily activities. This is usually used to diagnose what is causing palpitations/syncope (passing out).  Follow-Up: Your physician recommends that you schedule a follow-up appointment in: 5-6 WEEKS WITH DR. [place]   Any Other Special Instructions Will Be Listed Below (If Applicable). Cardiac Event Monitoring A cardiac event monitor is a small recording device used to help detect abnormal heart rhythms (arrhythmias). The monitor is used to record heart rhythm when noticeable symptoms such as the following occur:  Fast heartbeats (palpitations), such as heart racing or fluttering.  Dizziness.  Fainting or light-headedness.  Unexplained weakness. The monitor is wired to two electrodes placed on your chest. Electrodes are flat, sticky disks that attach to your skin. The monitor can be worn for up to 30 days. You will wear the monitor at all times, except when bathing.  HOW TO USE YOUR CARDIAC EVENT MONITOR A technician will prepare your chest for the electrode placement. The technician will show you how to place the electrodes, how to work the monitor, and how to replace the batteries. Take time to practice using the monitor before you leave the office. Make sure you understand how to send the information from the monitor to your health care provider. This requires a telephone with a landline, not a cell phone. You need to:  Wear your  monitor at all times, except when you are in water:  Do not get the monitor wet.  Take the monitor off when bathing. Do not swim or use a hot tub with it on.  Keep your skin clean. Do not put body lotion or moisturizer on your chest.  Change the electrodes daily or any time they stop sticking to your skin. You might need to use tape to keep them on.  It is possible that your skin under the electrodes could become irritated. To keep this from happening, try to put the electrodes in slightly different places on your chest. However, they must remain in the area under your left breast and in the upper right section of your chest.  Make sure the monitor is safely clipped to your clothing or in a location close to your body that your health care provider recommends.  Press the button to record when you feel symptoms of heart trouble, such as dizziness, weakness, light-headedness, palpitations, thumping, shortness of breath, unexplained weakness, or a fluttering or racing heart. The monitor is always on and records what happened slightly before you pressed the button, so do not worry about being too late to get good information.  Keep a diary of your activities, such as walking, doing chores, and taking medicine. It is especially important to note what you were doing when you pushed the button to record your symptoms. This will help your health care provider determine what might be contributing to your symptoms. The information stored in your monitor will be reviewed by your health care provider alongside your diary entries.  Send the recorded information as recommended by your health care  provider. It is important to understand that it will take some time for your health care provider to process the results.  Change the batteries as recommended by your health care provider. SEEK IMMEDIATE MEDICAL CARE IF:   You have chest pain.  You have extreme difficulty breathing or shortness of breath.  You  develop a very fast heartbeat that persists.  You develop dizziness that does not go away.  You faint or constantly feel you are about to faint.   This information is not intended to replace advice given to you by your health care provider. Make sure you discuss any questions you have with your health care provider.   Document Released: 04/03/2008 Document Revised: 07/16/2014 Document Reviewed: 12/22/2012 Elsevier Interactive Patient Education [employer].   If you need a refill on your cardiac medications before your next appointment, please call your pharmacy.

## 2015-10-13 NOTE — Progress Notes (Signed)
Cardiology Office Note   Date:  10/13/2015   ID:  YSAMAR [name], DOB 1945-12-06, MRN [id number]  PCP:  Norma Bill, MD  Cardiologist:  Dr Martinique  Barrett, Rhonda, PA-C   Chief Complaint  Patient presents with  . Follow-up    Heart feels like its racing, subsides after resting and taking hbp meds, feels like it catches in a knot    History of Present Illness: Norma [name] is a 70 y.o. female with a history of complex vascular surgery November 2012 including aorto-bifemoral bypass for aortic occlusive disease, CABG June of 2011. Chronic LE edema from venous insufficiency. Echo in Feb. 2014w/ normal EF and moderate PHTN and grade 1 dd. History of bradycardia - she remains on a lower dose of Coreg, 12.5 mg BID. She has refractory HTN, DM, HLD, DDD, carotid artery occlusion.  Norma [name] presents for Evaluation of palpitations.  The palpitations started in the last few weeks to 2 months. They are all the same. Each episode will start with her heart racing. She will feel it "catch" in pons and then that releases in her heart rate returns to normal. She has been getting these 3 or 4 times a week. They last a few seconds. They do not cause any chest pain or shortness of breath.  Ms. Ebarb feels dizzy most of the time. The dizziness comes with position changes, and makes her feel very off-balance. She does not get lightheaded sitting still. She will get lightheaded if she gets up out of a chair, or sits up out of bed. She has to be very careful moving herself around because of the dizziness. This has been going on a long time, but has not improved any. Dr. Martinique decreased her carvedilol but that did not change her symptoms.  She has not had any chest pain. Her dyspnea seems to be at baseline. Her lower extremity edema is also baseline and she wears compression stockings every day. She has not had any new dyspnea, orthopnea or PND. She has not lost consciousness.   Past  Medical History  Diagnosis Date  . Claudication ([place])   . Diabetes mellitus   . Hypertension   . Hyperlipidemia   . DJD (degenerative joint disease)   . PAD (peripheral artery disease) ([place])   . Coronary artery disease   . Leg pain   . Carotid artery occlusion   . Obesity   . DDD (degenerative disc disease)   . DJD (degenerative joint disease)   . Shortness of breath     exertion  . Anxiety   . Diabetic coma ([employer])   . Edema   . CKD (chronic kidney disease) stage 3, GFR 30-59 ml/min 04/18/2014    Past Surgical History  Procedure Laterality Date  . Cardiac catheterization  12/01/2009    EF 65%  . Removal of fibrous cyst from right breast  10+ YEARS  . Retinal detachment surgery  10+ YEARS    LEFT EYE  . Coronary artery bypass graft  12/08/2009    LIMA GRAFT TO THE DISTAL LAD AND SAPHENOUS VEIN GRAFT TO THE OBTUSE MARGINAL VESSEL  . Transthoracic echocardiogram  12/01/2009    EF 60-65%  . Cardiovascular stress test  11/28/2009    EF 75%  . Aorta - bilateral femoral artery bypass graft  05/25/2011    Procedure: AORTA BIFEMORAL BYPASS GRAFT;  Surgeon: Mal Misty, MD;  Location: [facility];  Service: Vascular;  Laterality: N/A;  .  Pr vein bypass graft,aorto-fem-pop  05/25/11    Current Outpatient Prescriptions  Medication Sig Dispense Refill  . acetaminophen (TYLENOL) 500 MG tablet Take 500-1,000 mg by mouth every 6 (six) hours as needed for moderate pain.    Marland Kitchen aspirin 325 MG tablet Take 325 mg by mouth every morning.    Marland Kitchen atorvastatin (LIPITOR) 80 MG tablet Take 1 tablet (80 mg total) by mouth daily. (Patient taking differently: Take 80 mg by mouth every morning. ) 30 tablet 6  . carvedilol (COREG) 12.5 MG tablet Take 1 tablet (12.5 mg total) by mouth 2 (two) times daily. 180 tablet 3  . Cholecalciferol (VITAMIN D3) 2000 UNITS TABS Take 2,000 Units by mouth every morning.     . cloNIDine (CATAPRES) 0.3 MG tablet Take 0.3 mg by mouth 2 (two) times daily.     . Coenzyme Q10 200 MG  capsule Take 200 mg by mouth every morning.     . ferrous sulfate 325 (65 FE) MG tablet Take 325 mg by mouth daily with breakfast.    . furosemide (LASIX) 80 MG tablet Take 80 mg by mouth daily as needed for fluid.     . hydrALAZINE (APRESOLINE) 50 MG tablet Take 50 mg by mouth 3 (three) times daily.    Marland Kitchen HYDROcodone-acetaminophen (NORCO) 7.5-325 MG per tablet Take 1 tablet by mouth 2 (two) times daily as needed for moderate pain.     Marland Kitchen insulin glargine (LANTUS) 100 UNIT/ML injection Inject 20 Units into the skin 2 (two) times daily.    . isosorbide dinitrate (ISORDIL) 20 MG tablet Take 40 mg by mouth 2 (two) times daily.     . Liraglutide (VICTOZA [place]) Inject 1.8 Units into the skin every morning.     . Magnesium 250 MG TABS Take 250 mg by mouth every morning.     . Probiotic Product (PROBIOTIC DAILY PO) Take 1 tablet by mouth every morning.     . vitamin B-12 (CYANOCOBALAMIN) 500 MCG tablet Take 500 mcg by mouth every morning.     Marland Kitchen allopurinol (ZYLOPRIM) 100 MG tablet Take 100 mg by mouth daily as needed (gout).     . chlorthalidone (HYGROTON) 25 MG tablet Take 25 mg by mouth every morning.     Marland Kitchen losartan (COZAAR) 100 MG tablet Take 100 mg by mouth every morning.      No current facility-administered medications for this visit.    Allergies:   Iohexol and Sulfa antibiotics    Social History:  The patient  reports that she quit smoking about 24 years ago. Her smoking use included Cigarettes. She quit after 20 years of use. She has never used smokeless tobacco. She reports that she does not drink alcohol or use illicit drugs.   Family History:  The patient's family history includes Cirrhosis in her brother; Coronary artery disease in her father; Hypertension in her mother and sister; Kidney disease in her daughter.    ROS:  Please see the history of present illness. All other systems are reviewed and negative.    PHYSICAL EXAM: VS:  BP 130/50 mmHg  Pulse 47  Ht 5\' 2"  (1.575 m)  Wt  218 lb (98.884 kg)  BMI 39.86 kg/m2 , BMI Body mass index is 39.86 kg/(m^2). GEN: Well nourished, well developed, female in no acute distress HEENT: normal for age  Neck: no JVD, carotid bruit on the left versus radiation of murmur (preferred), no masses Cardiac: RRR; 2/6 murmur, no rubs, or gallops Respiratory:  clear  to auscultation bilaterally, normal work of breathing GI: soft, nontender, nondistended, + BS MS: no deformity or atrophy; 1+ edema; distal pulses are 2+ in all 4 extremities  Skin: warm and dry, no rash Neuro:  Strength and sensation are intact Psych: euthymic mood, full affect   EKG:  EKG is ordered today. The ekg ordered today demonstrates sinus bradycardia, rate 47, lateral T-wave changes are noted different from previous ECG.   Recent Labs: 12/08/2014: ALT 16; BUN 46.7*; Creatinine 1.7*; Potassium 5.2*; Sodium 144 02/09/2015: HGB 10.1*; Platelets 232    Lipid Panel    Component Value Date/Time   CHOL 138 04/30/2014 0948   TRIG 169* 04/30/2014 0948   HDL 29* 04/30/2014 0948   CHOLHDL 4.8 04/30/2014 0948   VLDL 34 04/30/2014 0948   LDLCALC 75 04/30/2014 0948     Wt Readings from Last 3 Encounters:  10/13/15 218 lb (98.884 kg)  08/08/15 215 lb (97.523 kg)  02/09/15 210 lb 8 oz (95.482 kg)     Other studies Reviewed: Additional studies/ records that were reviewed today include: Office notes and other records.  ASSESSMENT AND PLAN:  1.  Palpitations: She has resting bradycardia at baseline, but is not symptomatic with this. She agrees to wear an event monitor to determine the reason for the palpitations. For now, no change in her beta blocker dosage.  2. Dizziness: When orthostatic vital signs were checked, her systolic blood pressure dropped 30 points when she first developed, better heart rate did not go up very much. I discussed this with the patient but because of the frequency of her palpitations, we decided to decrease the Isordil instead of the  carvedilol.   If decreasing the Isordil does not improve her symptoms, the Coreg should be decreased.  3. CAD: She is on good medical therapy with aspirin, statin, beta blocker, ARB and nitrates. She is not having any ischemic symptoms, continue current therapy.  4. Bradycardia: She has had this in the past, and her carvedilol dose was decreased. The bradycardia may be contributing to her dizziness, but because of her palpitations this will not be the first medication we change. Follow her heart rate on the event monitor.   Current medicines are reviewed at length with the patient today.  The patient does not have concerns regarding medicines.  The following changes have been made:  Decrease the Isordil  Labs/ tests ordered today include:   Orders Placed This Encounter  Procedures  . Cardiac event monitor  . EKG 12-Lead     Disposition:   FU with Dr. Martinique  Signed, Rosaria Ferries, PA-C  10/13/2015 4:43 PM    [facility] Phone: [phone]; Fax: [phone]  This note was written with the assistance of speech recognition software. Please excuse any transcriptional errors.

## 2015-10-14 ENCOUNTER — Telehealth: Payer: Self-pay | Admitting: Internal Medicine

## 2015-10-14 NOTE — Telephone Encounter (Signed)
PM PAL - moved 4/11 appointments to 4/12 @ 3 pm. Spoke with patient she is aware.

## 2015-10-18 ENCOUNTER — Other Ambulatory Visit: Payer: Self-pay | Admitting: Medical Oncology

## 2015-10-18 ENCOUNTER — Ambulatory Visit: Payer: Medicare Other | Admitting: Internal Medicine

## 2015-10-18 ENCOUNTER — Other Ambulatory Visit: Payer: Medicare Other

## 2015-10-18 DIAGNOSIS — D539 Nutritional anemia, unspecified: Secondary | ICD-10-CM

## 2015-10-19 ENCOUNTER — Ambulatory Visit (HOSPITAL_BASED_OUTPATIENT_CLINIC_OR_DEPARTMENT_OTHER): Payer: Medicare Other | Admitting: Internal Medicine

## 2015-10-19 ENCOUNTER — Encounter: Payer: Self-pay | Admitting: Internal Medicine

## 2015-10-19 ENCOUNTER — Other Ambulatory Visit (HOSPITAL_BASED_OUTPATIENT_CLINIC_OR_DEPARTMENT_OTHER): Payer: Medicare Other

## 2015-10-19 ENCOUNTER — Telehealth: Payer: Self-pay | Admitting: Internal Medicine

## 2015-10-19 ENCOUNTER — Other Ambulatory Visit: Payer: Self-pay | Admitting: *Deleted

## 2015-10-19 VITALS — BP 196/52 | HR 62 | Temp 98.8°F | Resp 18 | Ht 62.0 in | Wt 217.3 lb

## 2015-10-19 DIAGNOSIS — D539 Nutritional anemia, unspecified: Secondary | ICD-10-CM

## 2015-10-19 DIAGNOSIS — D649 Anemia, unspecified: Secondary | ICD-10-CM

## 2015-10-19 DIAGNOSIS — N183 Chronic kidney disease, stage 3 (moderate): Secondary | ICD-10-CM | POA: Diagnosis not present

## 2015-10-19 LAB — CBC WITH DIFFERENTIAL/PLATELET
BASO%: 0.7 % (ref 0.0–2.0)
Basophils Absolute: 0 10*3/uL (ref 0.0–0.1)
EOS%: 2 % (ref 0.0–7.0)
Eosinophils Absolute: 0.1 10*3/uL (ref 0.0–0.5)
HCT: 29.9 % — ABNORMAL LOW (ref 34.8–46.6)
HGB: 9.3 g/dL — ABNORMAL LOW (ref 11.6–15.9)
LYMPH%: 17 % (ref 14.0–49.7)
MCH: 27.8 pg (ref 25.1–34.0)
MCHC: 31.2 g/dL — AB (ref 31.5–36.0)
MCV: 89 fL (ref 79.5–101.0)
MONO#: 0.5 10*3/uL (ref 0.1–0.9)
MONO%: 7.6 % (ref 0.0–14.0)
NEUT%: 72.7 % (ref 38.4–76.8)
NEUTROS ABS: 4.4 10*3/uL (ref 1.5–6.5)
Platelets: 245 10*3/uL (ref 145–400)
RBC: 3.36 10*6/uL — AB (ref 3.70–5.45)
RDW: 15.6 % — ABNORMAL HIGH (ref 11.2–14.5)
WBC: 6.1 10*3/uL (ref 3.9–10.3)
lymph#: 1 10*3/uL (ref 0.9–3.3)

## 2015-10-19 LAB — COMPREHENSIVE METABOLIC PANEL
ALT: 15 U/L (ref 0–55)
ANION GAP: 10 meq/L (ref 3–11)
AST: 21 U/L (ref 5–34)
Albumin: 2.9 g/dL — ABNORMAL LOW (ref 3.5–5.0)
Alkaline Phosphatase: 76 U/L (ref 40–150)
BILIRUBIN TOTAL: 0.46 mg/dL (ref 0.20–1.20)
BUN: 72.3 mg/dL — AB (ref 7.0–26.0)
CHLORIDE: 107 meq/L (ref 98–109)
CO2: 23 meq/L (ref 22–29)
CREATININE: 2.5 mg/dL — AB (ref 0.6–1.1)
Calcium: 8.7 mg/dL (ref 8.4–10.4)
EGFR: 22 mL/min/{1.73_m2} — ABNORMAL LOW (ref >90)
GLUCOSE: 347 mg/dL — AB (ref 70–140)
Potassium: 4.6 mEq/L (ref 3.5–5.1)
SODIUM: 141 meq/L (ref 136–145)
TOTAL PROTEIN: 6.3 g/dL — AB (ref 6.4–8.3)

## 2015-10-19 NOTE — Telephone Encounter (Signed)
per pof to sch pt appt-gave pt copy of avs °

## 2015-10-19 NOTE — Progress Notes (Signed)
[facility] [phone]   [phone]  OFFICE PROGRESS NOTE  Bartholome Bill, MD [address] [place] [address]  DIAGNOSIS: Persistent normocytic anemia of unclear etiology questionable to be anemia of chronic disease plus/minus iron deficiency  PRIOR THERAPY: None  CURRENT THERAPY: Over-the-counter ferrous sulfate.  INTERVAL HISTORY: Norma [name] 70 y.o. female returns to the clinic today for six-month follow-up visit. The patient is feeling well today except for the persistent fatigue. She has renal insufficiency and is scheduled to see Dr. Florene Glen soon for evaluation of her chronic renal insufficiency. She denied having any significant weight loss or night sweats. She denied having any chest pain, shortness of breath, cough or hemoptysis. She has no nausea or vomiting. She had repeat CBC, iron study and ferritin performed earlier today and she is here for evaluation and discussion of her lab results.  MEDICAL HISTORY: Past Medical History  Diagnosis Date  . Claudication ([place])   . Diabetes mellitus   . Hypertension   . Hyperlipidemia   . DJD (degenerative joint disease)   . PAD (peripheral artery disease) ([place])   . Coronary artery disease   . Leg pain   . Carotid artery occlusion   . Obesity   . DDD (degenerative disc disease)   . DJD (degenerative joint disease)   . Shortness of breath     exertion  . Anxiety   . Diabetic coma ([place])   . Edema   . CKD (chronic kidney disease) stage 3, GFR 30-59 ml/min 04/18/2014    ALLERGIES:  is allergic to iohexol and sulfa antibiotics.  MEDICATIONS:  Current Outpatient Prescriptions  Medication Sig Dispense Refill  . acetaminophen (TYLENOL) 500 MG tablet Take 500-1,000 mg by mouth every 6 (six) hours as needed for moderate pain.    Marland Kitchen aspirin 325 MG tablet Take 325 mg by mouth every morning.    Marland Kitchen atorvastatin (LIPITOR) 80 MG tablet Take 1 tablet (80 mg total) by mouth daily.  (Patient taking differently: Take 80 mg by mouth every morning. ) 30 tablet 6  . carvedilol (COREG) 12.5 MG tablet Take 1 tablet (12.5 mg total) by mouth 2 (two) times daily. 180 tablet 3  . Cholecalciferol (VITAMIN D3) 2000 UNITS TABS Take 2,000 Units by mouth every morning.     . cloNIDine (CATAPRES) 0.3 MG tablet Take 0.3 mg by mouth 2 (two) times daily.     . Coenzyme Q10 200 MG capsule Take 200 mg by mouth every morning.     . ferrous sulfate 325 (65 FE) MG tablet Take 325 mg by mouth daily with breakfast.    . furosemide (LASIX) 80 MG tablet Take 80 mg by mouth daily as needed for fluid.     . hydrALAZINE (APRESOLINE) 50 MG tablet Take 50 mg by mouth 3 (three) times daily.    Marland Kitchen HYDROcodone-acetaminophen (NORCO) 7.5-325 MG per tablet Take 1 tablet by mouth 2 (two) times daily as needed for moderate pain.     Marland Kitchen insulin glargine (LANTUS) 100 UNIT/ML injection Inject 20 Units into the skin 2 (two) times daily.    . isosorbide dinitrate (ISORDIL) 20 MG tablet Take 40 mg by mouth daily.    . Liraglutide (VICTOZA [place]) Inject 1.8 Units into the skin every morning.     . Magnesium 250 MG TABS Take 250 mg by mouth every morning.     . Probiotic Product (PROBIOTIC DAILY PO) Take 1 tablet by mouth  every morning.     . vitamin B-12 (CYANOCOBALAMIN) 500 MCG tablet Take 500 mcg by mouth every morning.     Marland Kitchen allopurinol (ZYLOPRIM) 100 MG tablet Take 100 mg by mouth daily as needed (gout).     . chlorthalidone (HYGROTON) 25 MG tablet Take 25 mg by mouth every morning.     Marland Kitchen losartan (COZAAR) 100 MG tablet Take 100 mg by mouth every morning.      No current facility-administered medications for this visit.    SURGICAL HISTORY:  Past Surgical History  Procedure Laterality Date  . Cardiac catheterization  12/01/2009    EF 65%  . Removal of fibrous cyst from right breast  10+ YEARS  . Retinal detachment surgery  10+ YEARS    LEFT EYE  . Coronary artery bypass graft  12/08/2009    LIMA GRAFT TO THE DISTAL  LAD AND SAPHENOUS VEIN GRAFT TO THE OBTUSE MARGINAL VESSEL  . Transthoracic echocardiogram  12/01/2009    EF 60-65%  . Cardiovascular stress test  11/28/2009    EF 75%  . Aorta - bilateral femoral artery bypass graft  05/25/2011    Procedure: AORTA BIFEMORAL BYPASS GRAFT;  Surgeon: Mal Misty, MD;  Location: [facility];  Service: Vascular;  Laterality: N/A;  . Pr vein bypass graft,aorto-fem-pop  05/25/11    REVIEW OF SYSTEMS:  A comprehensive review of systems was negative except for: Constitutional: positive for fatigue   PHYSICAL EXAMINATION: General appearance: alert, cooperative, fatigued and no distress Head: Normocephalic, without obvious abnormality, atraumatic Neck: no adenopathy, no JVD, supple, symmetrical, trachea midline and thyroid not enlarged, symmetric, no tenderness/mass/nodules Lymph nodes: Cervical, supraclavicular, and axillary nodes normal. Resp: clear to auscultation bilaterally Back: symmetric, no curvature. ROM normal. No CVA tenderness. Cardio: regular rate and rhythm, S1, S2 normal, no murmur, click, rub or gallop GI: soft, non-tender; bowel sounds normal; no masses,  no organomegaly Extremities: extremities normal, atraumatic, no cyanosis or edema  ECOG PERFORMANCE STATUS: 1 - Symptomatic but completely ambulatory  Blood pressure 196/52, pulse 62, temperature 98.8 F (37.1 C), resp. rate 18, height 5' 2"  (1.575 m), weight 217 lb 4.8 oz (98.567 kg), SpO2 98 %.  LABORATORY DATA: Lab Results  Component Value Date   WBC 6.1 10/19/2015   HGB 9.3* 10/19/2015   HCT 29.9* 10/19/2015   MCV 89.0 10/19/2015   PLT 245 10/19/2015      Chemistry      Component Value Date/Time   NA 141 10/19/2015 1424   NA 143 11/11/2014 0143   K 4.6 10/19/2015 1424   K 4.7 11/11/2014 0143   CL 110 11/11/2014 0143   CO2 23 10/19/2015 1424   CO2 23 11/11/2014 0130   BUN 72.3* 10/19/2015 1424   BUN 33* 11/11/2014 0143   CREATININE 2.5* 10/19/2015 1424   CREATININE 1.40*  11/11/2014 0143   CREATININE 1.41* 06/01/2014 1510      Component Value Date/Time   CALCIUM 8.7 10/19/2015 1424   CALCIUM 8.9 11/11/2014 0130   ALKPHOS 76 10/19/2015 1424   ALKPHOS 65 11/11/2014 0130   AST 21 10/19/2015 1424   AST 22 11/11/2014 0130   ALT 15 10/19/2015 1424   ALT 18 11/11/2014 0130   BILITOT 0.46 10/19/2015 1424   BILITOT 0.6 11/11/2014 0130       RADIOGRAPHIC STUDIES: No results found.  ASSESSMENT AND PLAN: This is a very pleasant 70 years old African-American female with persistent anemia of unclear etiology most likely anemia of chronic  disease plus/minus iron deficiency The previous bone marrow biopsy and aspirate showed no concerning findings. CBC today showed low hemoglobin and hematocrit but stable compared to 9 months ago. Iron study and ferritin are still pending. I discussed the lab result with the patient today and give her the option of treatment with erythrocyte stimulating factor like Aranesp for the anemia of chronic disease especially with her chronic renal insufficiency. I discussed the adverse effect of this treatment with the patient and she is not interested in proceeding with it. I recommended for the patient to continue on the oral ferrous sulfate for now. She would come back for follow-up visit in 6 months for reevaluation with repeat CBC, iron study and ferritin. She was advised to call immediately if she has any concerning symptoms in the interval. The patient voices understanding of current disease status and treatment options and is in agreement with the current care plan.  All questions were answered. The patient knows to call the clinic with any problems, questions or concerns. We can certainly see the patient much sooner if necessary.  Disclaimer: This note was dictated with voice recognition software. Similar sounding words can inadvertently be transcribed and may not be corrected upon review.

## 2015-10-20 LAB — IRON AND TIBC
%SAT: 20 % — AB (ref 21–57)
IRON: 48 ug/dL (ref 41–142)
TIBC: 241 ug/dL (ref 236–444)
UIBC: 193 ug/dL (ref 120–384)

## 2015-10-20 LAB — FERRITIN: FERRITIN: 200 ng/mL (ref 9–269)

## 2015-10-28 ENCOUNTER — Emergency Department (HOSPITAL_COMMUNITY): Payer: Medicare Other

## 2015-10-28 ENCOUNTER — Observation Stay (HOSPITAL_COMMUNITY)
Admission: EM | Admit: 2015-10-28 | Discharge: 2015-11-01 | Disposition: A | Discharge status: Skilled Nursing Facility | Payer: Medicare Other | Attending: Internal Medicine | Admitting: Internal Medicine

## 2015-10-28 ENCOUNTER — Encounter (HOSPITAL_COMMUNITY): Payer: Self-pay | Admitting: Emergency Medicine

## 2015-10-28 DIAGNOSIS — Z79891 Long term (current) use of opiate analgesic: Secondary | ICD-10-CM | POA: Insufficient documentation

## 2015-10-28 DIAGNOSIS — E119 Type 2 diabetes mellitus without complications: Secondary | ICD-10-CM

## 2015-10-28 DIAGNOSIS — M1611 Unilateral primary osteoarthritis, right hip: Secondary | ICD-10-CM | POA: Diagnosis not present

## 2015-10-28 DIAGNOSIS — I129 Hypertensive chronic kidney disease with stage 1 through stage 4 chronic kidney disease, or unspecified chronic kidney disease: Secondary | ICD-10-CM | POA: Insufficient documentation

## 2015-10-28 DIAGNOSIS — R609 Edema, unspecified: Secondary | ICD-10-CM | POA: Insufficient documentation

## 2015-10-28 DIAGNOSIS — I6529 Occlusion and stenosis of unspecified carotid artery: Secondary | ICD-10-CM | POA: Diagnosis not present

## 2015-10-28 DIAGNOSIS — Z6841 Body Mass Index (BMI) 40.0 and over, adult: Secondary | ICD-10-CM | POA: Diagnosis not present

## 2015-10-28 DIAGNOSIS — N183 Chronic kidney disease, stage 3 unspecified: Secondary | ICD-10-CM

## 2015-10-28 DIAGNOSIS — D539 Nutritional anemia, unspecified: Secondary | ICD-10-CM | POA: Diagnosis present

## 2015-10-28 DIAGNOSIS — K5900 Constipation, unspecified: Secondary | ICD-10-CM | POA: Diagnosis not present

## 2015-10-28 DIAGNOSIS — Z794 Long term (current) use of insulin: Secondary | ICD-10-CM | POA: Insufficient documentation

## 2015-10-28 DIAGNOSIS — D509 Iron deficiency anemia, unspecified: Secondary | ICD-10-CM | POA: Diagnosis not present

## 2015-10-28 DIAGNOSIS — Z79899 Other long term (current) drug therapy: Secondary | ICD-10-CM | POA: Insufficient documentation

## 2015-10-28 DIAGNOSIS — Z951 Presence of aortocoronary bypass graft: Secondary | ICD-10-CM | POA: Diagnosis not present

## 2015-10-28 DIAGNOSIS — E1151 Type 2 diabetes mellitus with diabetic peripheral angiopathy without gangrene: Secondary | ICD-10-CM | POA: Insufficient documentation

## 2015-10-28 DIAGNOSIS — I2510 Atherosclerotic heart disease of native coronary artery without angina pectoris: Secondary | ICD-10-CM | POA: Diagnosis not present

## 2015-10-28 DIAGNOSIS — M109 Gout, unspecified: Secondary | ICD-10-CM | POA: Diagnosis not present

## 2015-10-28 DIAGNOSIS — Z87891 Personal history of nicotine dependence: Secondary | ICD-10-CM | POA: Diagnosis not present

## 2015-10-28 DIAGNOSIS — E785 Hyperlipidemia, unspecified: Secondary | ICD-10-CM | POA: Diagnosis present

## 2015-10-28 DIAGNOSIS — M25559 Pain in unspecified hip: Secondary | ICD-10-CM | POA: Diagnosis present

## 2015-10-28 DIAGNOSIS — R52 Pain, unspecified: Secondary | ICD-10-CM | POA: Diagnosis not present

## 2015-10-28 DIAGNOSIS — M25562 Pain in left knee: Secondary | ICD-10-CM

## 2015-10-28 DIAGNOSIS — G8929 Other chronic pain: Secondary | ICD-10-CM | POA: Diagnosis not present

## 2015-10-28 DIAGNOSIS — N184 Chronic kidney disease, stage 4 (severe): Secondary | ICD-10-CM | POA: Diagnosis not present

## 2015-10-28 DIAGNOSIS — M25551 Pain in right hip: Secondary | ICD-10-CM

## 2015-10-28 DIAGNOSIS — E1122 Type 2 diabetes mellitus with diabetic chronic kidney disease: Secondary | ICD-10-CM | POA: Insufficient documentation

## 2015-10-28 DIAGNOSIS — I10 Essential (primary) hypertension: Secondary | ICD-10-CM | POA: Diagnosis present

## 2015-10-28 DIAGNOSIS — M549 Dorsalgia, unspecified: Secondary | ICD-10-CM | POA: Diagnosis not present

## 2015-10-28 DIAGNOSIS — Z7982 Long term (current) use of aspirin: Secondary | ICD-10-CM | POA: Insufficient documentation

## 2015-10-28 LAB — CBG MONITORING, ED
Glucose-Capillary: 233 mg/dL — ABNORMAL HIGH (ref 65–99)
Glucose-Capillary: 204 mg/dL — ABNORMAL HIGH (ref 65–99)

## 2015-10-28 LAB — BASIC METABOLIC PANEL
Anion gap: 14 (ref 5–15)
BUN: 57 mg/dL — ABNORMAL HIGH (ref 6–20)
CO2: 20 mmol/L — AB (ref 22–32)
Calcium: 9.3 mg/dL (ref 8.9–10.3)
Chloride: 108 mmol/L (ref 101–111)
Creatinine, Ser: 1.67 mg/dL — ABNORMAL HIGH (ref 0.44–1.00)
GFR calc Af Amer: 35 mL/min — ABNORMAL LOW (ref >60)
GFR, EST NON AFRICAN AMERICAN: 30 mL/min — AB (ref >60)
GLUCOSE: 272 mg/dL — AB (ref 65–99)
Potassium: 4.4 mmol/L (ref 3.5–5.1)
Sodium: 142 mmol/L (ref 135–145)

## 2015-10-28 LAB — CBC WITH DIFFERENTIAL/PLATELET
BASOS ABS: 0 10*3/uL (ref 0.0–0.1)
Basophils Relative: 0 %
Eosinophils Absolute: 0 10*3/uL (ref 0.0–0.7)
Eosinophils Relative: 0 %
HEMATOCRIT: 30.5 % — AB (ref 36.0–46.0)
HEMOGLOBIN: 10 g/dL — AB (ref 12.0–15.0)
LYMPHS PCT: 13 %
Lymphs Abs: 1.1 10*3/uL (ref 0.7–4.0)
MCH: 28.7 pg (ref 26.0–34.0)
MCHC: 32.8 g/dL (ref 30.0–36.0)
MCV: 87.6 fL (ref 78.0–100.0)
Monocytes Absolute: 0.5 10*3/uL (ref 0.1–1.0)
Monocytes Relative: 6 %
NEUTROS ABS: 6.9 10*3/uL (ref 1.7–7.7)
NEUTROS PCT: 81 %
PLATELETS: 251 10*3/uL (ref 150–400)
RBC: 3.48 MIL/uL — AB (ref 3.87–5.11)
RDW: 14.8 % (ref 11.5–15.5)
WBC: 8.6 10*3/uL (ref 4.0–10.5)

## 2015-10-28 LAB — GLUCOSE, CAPILLARY: Glucose-Capillary: 203 mg/dL — ABNORMAL HIGH (ref 65–99)

## 2015-10-28 MED ORDER — CARVEDILOL 12.5 MG PO TABS
12.5000 mg | ORAL_TABLET | Freq: Two times a day (BID) | ORAL | Status: DC
  Administered 2015-10-29: 12.5 mg via ORAL
  Filled 2015-10-28 (×3): qty 1

## 2015-10-28 MED ORDER — FUROSEMIDE 80 MG PO TABS
80.0000 mg | ORAL_TABLET | Freq: Every day | ORAL | Status: DC
Start: 2015-10-28 — End: 2015-10-30
  Administered 2015-10-29 – 2015-10-30 (×2): 80 mg via ORAL
  Filled 2015-10-28 (×3): qty 1

## 2015-10-28 MED ORDER — INSULIN GLARGINE 100 UNIT/ML ~~LOC~~ SOLN
20.0000 [IU] | Freq: Once | SUBCUTANEOUS | Status: AC
  Administered 2015-10-28: 20 [IU] via SUBCUTANEOUS
  Filled 2015-10-28: qty 0.2

## 2015-10-28 MED ORDER — ONDANSETRON HCL 4 MG/2ML IJ SOLN: 4.0000 mg | Freq: Four times a day (QID) | INTRAMUSCULAR | Status: DC | PRN

## 2015-10-28 MED ORDER — DOCUSATE SODIUM 100 MG PO CAPS
100.0000 mg | ORAL_CAPSULE | Freq: Two times a day (BID) | ORAL | Status: DC
  Administered 2015-10-28 – 2015-11-01 (×8): 100 mg via ORAL

## 2015-10-28 MED ORDER — CLONIDINE HCL 0.3 MG PO TABS
0.3000 mg | ORAL_TABLET | Freq: Two times a day (BID) | ORAL | Status: DC
  Administered 2015-10-28 – 2015-11-01 (×8): 0.3 mg via ORAL
  Filled 2015-10-28 (×9): qty 1

## 2015-10-28 MED ORDER — MORPHINE SULFATE (PF) 4 MG/ML IV SOLN
4.0000 mg | Freq: Once | INTRAVENOUS | Status: AC
  Administered 2015-10-28: 4 mg via INTRAVENOUS
  Filled 2015-10-28: qty 1

## 2015-10-28 MED ORDER — FERROUS SULFATE 325 (65 FE) MG PO TABS
325.0000 mg | ORAL_TABLET | Freq: Every day | ORAL | Status: DC
  Administered 2015-10-29 – 2015-11-01 (×4): 325 mg via ORAL
  Filled 2015-10-28 (×5): qty 1

## 2015-10-28 MED ORDER — ASPIRIN 325 MG PO TABS
325.0000 mg | ORAL_TABLET | Freq: Every morning | ORAL | Status: DC
  Administered 2015-10-29 – 2015-11-01 (×4): 325 mg via ORAL
  Filled 2015-10-28 (×4): qty 1

## 2015-10-28 MED ORDER — INSULIN ASPART 100 UNIT/ML ~~LOC~~ SOLN
0.0000 [IU] | Freq: Three times a day (TID) | SUBCUTANEOUS | Status: DC
  Administered 2015-10-29 – 2015-10-30 (×4): 3 [IU] via SUBCUTANEOUS
  Administered 2015-10-30: 5 [IU] via SUBCUTANEOUS

## 2015-10-28 MED ORDER — HYDRALAZINE HCL 50 MG PO TABS
50.0000 mg | ORAL_TABLET | Freq: Three times a day (TID) | ORAL | Status: DC
  Administered 2015-10-28 – 2015-10-29 (×2): 50 mg via ORAL
  Filled 2015-10-28 (×5): qty 1

## 2015-10-28 MED ORDER — ACETAMINOPHEN 500 MG PO TABS
500.0000 mg | ORAL_TABLET | Freq: Four times a day (QID) | ORAL | Status: DC | PRN
  Administered 2015-10-29 (×2): 500 mg via ORAL
  Filled 2015-10-28 (×2): qty 1

## 2015-10-28 MED ORDER — ISOSORBIDE DINITRATE 20 MG PO TABS
40.0000 mg | ORAL_TABLET | Freq: Every day | ORAL | Status: DC
  Administered 2015-10-29 – 2015-11-01 (×4): 40 mg via ORAL
  Filled 2015-10-28 (×5): qty 2

## 2015-10-28 MED ORDER — CARVEDILOL 12.5 MG PO TABS
12.5000 mg | ORAL_TABLET | Freq: Once | ORAL | Status: AC
  Administered 2015-10-28: 12.5 mg via ORAL
  Filled 2015-10-28: qty 1

## 2015-10-28 MED ORDER — INSULIN GLARGINE 100 UNIT/ML ~~LOC~~ SOLN
10.0000 [IU] | Freq: Two times a day (BID) | SUBCUTANEOUS | Status: DC
  Administered 2015-10-28 – 2015-10-30 (×4): 10 [IU] via SUBCUTANEOUS
  Filled 2015-10-28 (×6): qty 0.1

## 2015-10-28 MED ORDER — VITAMIN D3 25 MCG (1000 UNIT) PO TABS
2000.0000 [IU] | ORAL_TABLET | Freq: Every morning | ORAL | Status: DC
  Administered 2015-10-29 – 2015-11-01 (×4): 2000 [IU] via ORAL
  Filled 2015-10-28 (×4): qty 2

## 2015-10-28 MED ORDER — ONDANSETRON HCL 4 MG PO TABS: 4.0000 mg | ORAL_TABLET | Freq: Four times a day (QID) | ORAL | Status: DC | PRN

## 2015-10-28 MED ORDER — LOSARTAN POTASSIUM 50 MG PO TABS
100.0000 mg | ORAL_TABLET | Freq: Every morning | ORAL | Status: DC
  Administered 2015-10-29 – 2015-11-01 (×4): 100 mg via ORAL
  Filled 2015-10-28 (×4): qty 2

## 2015-10-28 MED ORDER — CYCLOBENZAPRINE HCL 5 MG PO TABS
5.0000 mg | ORAL_TABLET | Freq: Three times a day (TID) | ORAL | Status: DC | PRN
  Administered 2015-10-28 – 2015-10-29 (×3): 5 mg via ORAL
  Filled 2015-10-28 (×3): qty 1

## 2015-10-28 MED ORDER — INSULIN ASPART 100 UNIT/ML ~~LOC~~ SOLN
0.0000 [IU] | Freq: Every day | SUBCUTANEOUS | Status: DC
  Administered 2015-10-28: 2 [IU] via SUBCUTANEOUS
  Administered 2015-10-29: 3 [IU] via SUBCUTANEOUS
  Administered 2015-10-30: 2 [IU] via SUBCUTANEOUS
  Administered 2015-10-31: 3 [IU] via SUBCUTANEOUS

## 2015-10-28 MED ORDER — OXYCODONE HCL 5 MG PO TABS
5.0000 mg | ORAL_TABLET | ORAL | Status: DC | PRN
  Administered 2015-10-28 – 2015-11-01 (×8): 5 mg via ORAL
  Filled 2015-10-28 (×9): qty 1

## 2015-10-28 MED ORDER — MORPHINE SULFATE (PF) 2 MG/ML IV SOLN
2.0000 mg | INTRAVENOUS | Status: DC | PRN
  Administered 2015-10-28 – 2015-10-29 (×2): 2 mg via INTRAVENOUS
  Filled 2015-10-28 (×2): qty 1

## 2015-10-28 MED ORDER — ATORVASTATIN CALCIUM 80 MG PO TABS
80.0000 mg | ORAL_TABLET | Freq: Every morning | ORAL | Status: DC
  Administered 2015-10-29 – 2015-11-01 (×4): 80 mg via ORAL
  Filled 2015-10-28 (×5): qty 1

## 2015-10-28 MED ORDER — CHLORTHALIDONE 25 MG PO TABS
25.0000 mg | ORAL_TABLET | Freq: Every day | ORAL | Status: AC
  Administered 2015-10-28: 25 mg via ORAL
  Filled 2015-10-28: qty 1

## 2015-10-28 MED ORDER — CLONIDINE HCL 0.1 MG PO TABS
0.3000 mg | ORAL_TABLET | Freq: Once | ORAL | Status: AC
  Administered 2015-10-28: 0.3 mg via ORAL
  Filled 2015-10-28: qty 3

## 2015-10-28 MED ORDER — HYDRALAZINE HCL 50 MG PO TABS
50.0000 mg | ORAL_TABLET | Freq: Once | ORAL | Status: AC
  Administered 2015-10-28: 50 mg via ORAL
  Filled 2015-10-28: qty 1

## 2015-10-28 MED ORDER — HEPARIN SODIUM (PORCINE) 5000 UNIT/ML IJ SOLN
5000.0000 [IU] | Freq: Three times a day (TID) | INTRAMUSCULAR | Status: DC
  Administered 2015-10-28 – 2015-10-29 (×4): 5000 [IU] via SUBCUTANEOUS
  Filled 2015-10-28 (×8): qty 1

## 2015-10-28 MED ORDER — LOSARTAN POTASSIUM 50 MG PO TABS
100.0000 mg | ORAL_TABLET | Freq: Once | ORAL | Status: AC
  Administered 2015-10-28: 100 mg via ORAL
  Filled 2015-10-28: qty 2

## 2015-10-28 MED ORDER — CHLORTHALIDONE 25 MG PO TABS
25.0000 mg | ORAL_TABLET | Freq: Every morning | ORAL | Status: DC
  Administered 2015-10-29 – 2015-10-30 (×2): 25 mg via ORAL
  Filled 2015-10-28 (×2): qty 1

## 2015-10-28 NOTE — ED Notes (Signed)
BLOOD SUGAR 233

## 2015-10-28 NOTE — H&P (Signed)
History and Physical    Norma [name] [id number] DOB: 12/07/1945 DOA: 10/28/2015  Referring MD/NP/PA: Cathleen Fears PCP: Bartholome Bill, MD  Outpatient Specialists: [place], Cardiology; Florene Glen, Nephro; Latanya Presser, audiology  Patient coming from: home  Chief Complaint: hip pain  HPI: Norma [name] is a 70 y.o. female with medical history significant of DM, claudication, HLD, HTN, DJD, CAD, anemia, possible a flutter (supposed to be on a monitor now) presents with hip pain.  Per her, she has intermittent DJD that sometimes causes her issues.  She notes about 2 weeks ago she had some hip pain on the right which caused her to need a wheelchair at church. This improved.  However, on Wednesday of this week she had a recurrence of this pain to the point that she could not walk.  Pain was located in her groin/hip and "felt like pain."  It was constant and she could not grade the level of pain because "pain is pain." She did not have an injury.  She got to the point where she could not bear weight, so she came to the hospital.  Morphine made it better.  Worse with standing.  She also noted urinary retention that they found in the ED, and she had a straight catheter.  She notes that she has urinated since that time.  She has no other complaints beyond chronic complaints and denies active chest pain, nausea, vomiting, abdominal pain, bloating, fever, chills, trauma, recent travel.  She does have a history of swelling in her legs, worse on the right and she had an ultrasound of the right leg on Wednesday and per report from the ED and the patient is that she does not have a clot in that leg.     ED Course: Pain control was managed with morphine and her pain improved, however, any movement worsens pain.  She had a CT of the hip which showed severe DJD, but no fracture.   Review of Systems: As per HPI otherwise 10 point review of systems negative.   Past Medical History  Diagnosis Date  .  Claudication ([place])   . Diabetes mellitus   . Hypertension   . Hyperlipidemia   . DJD (degenerative joint disease)   . PAD (peripheral artery disease) ([place])   . Coronary artery disease   . Leg pain   . Carotid artery occlusion   . Obesity   . DDD (degenerative disc disease)   . DJD (degenerative joint disease)   . Shortness of breath     exertion  . Anxiety   . Diabetic coma ([place])   . Edema   . CKD (chronic kidney disease) stage 3, GFR 30-59 ml/min 04/18/2014    Past Surgical History  Procedure Laterality Date  . Cardiac catheterization  12/01/2009    EF 65%  . Removal of fibrous cyst from right breast  10+ YEARS  . Retinal detachment surgery  10+ YEARS    LEFT EYE  . Coronary artery bypass graft  12/08/2009    LIMA GRAFT TO THE DISTAL LAD AND SAPHENOUS VEIN GRAFT TO THE OBTUSE MARGINAL VESSEL  . Transthoracic echocardiogram  12/01/2009    EF 60-65%  . Cardiovascular stress test  11/28/2009    EF 75%  . Aorta - bilateral femoral artery bypass graft  05/25/2011    Procedure: AORTA BIFEMORAL BYPASS GRAFT;  Surgeon: Mal Misty, MD;  Location: [place];  Service: Vascular;  Laterality: N/A;  . Pr vein bypass graft,aorto-fem-pop  05/25/11    Reports that she quit smoking about 24 years ago. Her smoking use included Cigarettes. She quit after 20 years of use. She has never used smokeless tobacco. She reports that she does not drink alcohol or use illicit drugs.  Allergies  Allergen Reactions  . Iohexol      Code: RASH, Desc: pt called 1 day post scanning stating that skin was red and "itching all over" some what better but still had symptom.. instructed pt to take benadryl to relieve symptoms,per dr Alvester Chou.if any problems call back/mms, Onset Date: [id number]   . Sulfa Antibiotics Other (See Comments)    unknown    Family History  Problem Relation Age of Onset  . Hypertension Mother   . Coronary artery disease Father   . Hypertension Sister   . Cirrhosis Brother   . Kidney  disease Daughter     Prior to Admission medications   Medication Sig Start Date End Date Taking? Authorizing Provider  acetaminophen (TYLENOL) 500 MG tablet Take 500-1,000 mg by mouth every 6 (six) hours as needed for moderate pain.   Yes Historical Provider, MD  aspirin 325 MG tablet Take 325 mg by mouth every morning.   Yes Historical Provider, MD  atorvastatin (LIPITOR) 80 MG tablet Take 1 tablet (80 mg total) by mouth daily. Patient taking differently: Take 80 mg by mouth every morning.  04/20/14  Yes Peter M Martinique, MD  carvedilol (COREG) 12.5 MG tablet Take 1 tablet (12.5 mg total) by mouth 2 (two) times daily. 09/23/13  Yes Peter M [place], MD  Cholecalciferol (VITAMIN D3) 2000 UNITS TABS Take 2,000 Units by mouth every morning.    Yes Historical Provider, MD  cloNIDine (CATAPRES) 0.3 MG tablet Take 0.3 mg by mouth 2 (two) times daily.  12/30/14  Yes Historical Provider, MD  Coenzyme Q10 200 MG capsule Take 200 mg by mouth every morning.    Yes Historical Provider, MD  ferrous sulfate 325 (65 FE) MG tablet Take 325 mg by mouth daily with breakfast.   Yes Historical Provider, MD  furosemide (LASIX) 80 MG tablet Take 80 mg by mouth daily.  09/21/14  Yes Historical Provider, MD  hydrALAZINE (APRESOLINE) 50 MG tablet Take 50 mg by mouth 3 (three) times daily. 11/24/14  Yes Historical Provider, MD  HYDROcodone-acetaminophen (NORCO) 7.5-325 MG per tablet Take 1 tablet by mouth 2 (two) times daily as needed for moderate pain.  07/20/14  Yes Historical Provider, MD  insulin glargine (LANTUS) 100 UNIT/ML injection Inject 20 Units into the skin 2 (two) times daily.   Yes Historical Provider, MD  isosorbide dinitrate (ISORDIL) 20 MG tablet Take 40 mg by mouth daily. 11/24/14  Yes Historical Provider, MD  Liraglutide (VICTOZA [place]) Inject 1.8 Units into the skin every morning.    Yes Historical Provider, MD  losartan (COZAAR) 100 MG tablet Take 100 mg by mouth every morning.  06/22/14 10/28/15 Yes Historical  Provider, MD  Magnesium 250 MG TABS Take 250 mg by mouth every morning.    Yes Historical Provider, MD  Probiotic Product (PROBIOTIC DAILY PO) Take 1 tablet by mouth every morning.    Yes Historical Provider, MD  vitamin B-12 (CYANOCOBALAMIN) 500 MCG tablet Take 500 mcg by mouth every morning.    Yes Historical Provider, MD  allopurinol (ZYLOPRIM) 100 MG tablet Take 100 mg by mouth daily as needed (gout).  06/23/14 06/23/15  Historical Provider, MD  chlorthalidone (HYGROTON) 25 MG tablet Take 25 mg by mouth every morning.  06/21/14 06/21/15  Historical Provider, MD    Physical Exam: Filed Vitals:   10/28/15 1630 10/28/15 1635 10/28/15 1659 10/28/15 1740  BP: 133/44   132/40  Pulse: 57  55   Temp:    98.4 F (36.9 C)  TempSrc:    Oral  Resp:  16  17  Height:      Weight:      SpO2: 94%  93% 99%    Constitutional: NAD, calm, comfortable when lying still.  Filed Vitals:   10/28/15 1630 10/28/15 1635 10/28/15 1659 10/28/15 1740  BP: 133/44   132/40  Pulse: 57  55   Temp:    98.4 F (36.9 C)  TempSrc:    Oral  Resp:  16  17  Height:      Weight:      SpO2: 94%  93% 99%   ENMT: Mucous membranes are moist. Posterior pharynx clear of any exudate or lesions.  Neck: normal, supple Respiratory: clear to auscultation bilaterally, no wheezing, no crackles. Normal respiratory effort. No accessory muscle use.  Cardiovascular: Regular rate and rhythm, no murmurs / rubs / gallops. 1+ pitting edema on the left to mid calf.  2+ pitting edema on the right to mid calf.  Abdomen: no tenderness, no masses palpated. Bowel sounds positive.  Musculoskeletal: no clubbing / cyanosis. No joint deformity upper and lower extremities. Pain with any manipulation and rotation of the right hip Skin: no rashes, lesions, ulcers.  Neurologic: CN 2-12 grossly intact.  Unable to fully evaluate as she could not move the right leg.  Psychiatric: Normal judgment and insight. Alert and oriented x 3. Normal mood.    Labs on Admission: I have personally reviewed following labs and imaging studies  CBC:  Recent Labs Lab 10/28/15 1149  WBC 8.6  NEUTROABS 6.9  HGB 10.0*  HCT 30.5*  MCV 87.6  PLT [id number]   Basic Metabolic Panel:  Recent Labs Lab 10/28/15 1149  NA 142  K 4.4  CL 108  CO2 20*  GLUCOSE 272*  BUN 57*  CREATININE 1.67*  CALCIUM 9.3   CBG:  Recent Labs Lab 10/28/15 1059 10/28/15 1517  GLUCAP 233* 204*   Radiological Exams on Admission: Ct Hip Right Wo Contrast  10/28/2015  CLINICAL DATA:  Right hip pain.  Suspicious for occult fracture. EXAM: CT OF THE RIGHT HIP WITHOUT CONTRAST TECHNIQUE: Multidetector CT imaging of the right hip was performed according to the standard protocol. Multiplanar CT image reconstructions were also generated. COMPARISON:  None. FINDINGS: No acute fracture or dislocation. No lytic or sclerotic osseous lesion. No periosteal reaction or bone destruction. Severe osteoarthritis of the right hip with severe superior joint space narrowing the bone-on-bone appearance, subchondral reactive marrow changes and marginal osteophytosis. Normal soft tissues. No muscle atrophy. No focal fluid collection or hematoma. Peripheral vascular atherosclerotic disease. Small calcified uterine masses likely representing uterine fibroids. IMPRESSION: 1. No acute osseous injury of the right hip. 2. Severe osteoarthritis of the right hip. Electronically Signed   By: Kathreen Devoid   On: 10/28/2015 15:26   Dg Hip Unilat With Pelvis 2-3 Views Right  10/28/2015  CLINICAL DATA:  70 year old female with a history of right hip pain. EXAM: DG HIP (WITH OR WITHOUT PELVIS) 2-3V RIGHT COMPARISON:  CT abdomen/ pelvis 05/01/2011 FINDINGS: Bony pelvic ring appears intact.  No acute fracture identified. Bilateral hips projects normally over the acetabula. Advanced changes of osteoarthritis of right greater than left hips with joint space narrowing, bone-on-bone  articulation, subchondral sclerosis  and cyst formation. Dense vascular calcifications. Surgical changes of the anatomic pelvis. IMPRESSION: Negative for acute bony abnormality. Advanced right greater than left hip osteoarthritis. Advanced atherosclerosis. Signed, Dulcy Fanny. Earleen Newport, DO [facility] Radiology Electronically Signed   By: Corrie Mckusick D.O.   On: 10/28/2015 12:58      Assessment/Plan Severe OA of right hip with intractable pain - Imaging shows severe OA, she has likely had symptoms for a long while - Pain control with morphine, oxycodone for now, transition to tylenol when able - PT for exercises - Orthopedics in the AM for evaluation for surgery  LE Edema - Unclear cause, TTE from 2014 shows only grade 1 diastolic dysfunction.  Consider repeat imaging (ultrasound) of leg if concerning in the morning.  Possible related to not using the leg given the pain in her hip.  - She does have low albumin, so could also be related to poor nutrition.   Hyperlipidemia - Continue statin    Hypertension - Continue home meds (7 in total); coreg, chlorthalidone, clonidine, lasix, hydralazine, imdur, losartan    Diabetes mellitus type 2, insulin dependent  - Hold victoza, SSI, lantus at 10 units BID    Iron Deficiency anemia - Continue iron supplementation  CKD - Appears at baseline, monitor.    Diet: Heart healthy   DVT prophylaxis: Heparin Code Status: Full Family Communication: Daughter at bedside  Disposition Plan: 1-2 days  Consults called: None yet, ortho in the morning Admission status: Observation, med Kathlene Cote MD [facility] Pager [phone]  If 7PM-7AM, please contact night-coverage [email or web address] Password [facility]  10/28/2015, 7:23 PM

## 2015-10-28 NOTE — ED Notes (Signed)
In addition to below note, pt states doppler was negative for thrombi yesterday, gel was placed on patient's right groin for dopple, pain onset after procedure, skin over right inguinal area edematous and taut. Pedal pulses 2+ bilateral. Pitting edema to lower extremities bilaterally, pt states this is normal.

## 2015-10-28 NOTE — ED Provider Notes (Signed)
CSN: [id number]     Arrival date & time 10/28/15  1043 History   First MD Initiated Contact with Patient 10/28/15 1114     Chief Complaint  Patient presents with  . Leg Pain     (Consider location/radiation/quality/duration/timing/severity/associated sxs/prior Treatment) HPI Complains of pain at right groin onset 3 days ago. Pain is worse with moving her thigh or with weightbearing improved with remaining still today she's been unable to walk due to pain. No treatment prior to coming here. No injury. No other associated symptoms. She had Doppler study performed of her right leg yesterday, which showed no DVT, Baker's cyst high behind right knee Past Medical History  Diagnosis Date  . Claudication ([place])   . Diabetes mellitus   . Hypertension   . Hyperlipidemia   . DJD (degenerative joint disease)   . PAD (peripheral artery disease) ([place])   . Coronary artery disease   . Leg pain   . Carotid artery occlusion   . Obesity   . DDD (degenerative disc disease)   . DJD (degenerative joint disease)   . Shortness of breath     exertion  . Anxiety   . Diabetic coma ([place])   . Edema   . CKD (chronic kidney disease) stage 3, GFR 30-59 ml/min 04/18/2014   Past Surgical History  Procedure Laterality Date  . Cardiac catheterization  12/01/2009    EF 65%  . Removal of fibrous cyst from right breast  10+ YEARS  . Retinal detachment surgery  10+ YEARS    LEFT EYE  . Coronary artery bypass graft  12/08/2009    LIMA GRAFT TO THE DISTAL LAD AND SAPHENOUS VEIN GRAFT TO THE OBTUSE MARGINAL VESSEL  . Transthoracic echocardiogram  12/01/2009    EF 60-65%  . Cardiovascular stress test  11/28/2009    EF 75%  . Aorta - bilateral femoral artery bypass graft  05/25/2011    Procedure: AORTA BIFEMORAL BYPASS GRAFT;  Surgeon: Mal Misty, MD;  Location: [place];  Service: Vascular;  Laterality: N/A;  . Pr vein bypass graft,aorto-fem-pop  05/25/11   Family History  Problem Relation Age of Onset  .  Hypertension Mother   . Coronary artery disease Father   . Hypertension Sister   . Cirrhosis Brother   . Kidney disease Daughter    Social History  Substance Use Topics  . Smoking status: Former Smoker -- 20 years    Types: Cigarettes    Quit date: 07/10/1991  . Smokeless tobacco: Never Used  . Alcohol Use: No   OB History    No data available     Review of Systems  Musculoskeletal: Positive for arthralgias and gait problem.  Allergic/Immunologic: Positive for immunocompromised state.       Diabetic  All other systems reviewed and are negative.     Allergies  Iohexol and Sulfa antibiotics  Home Medications   Prior to Admission medications   Medication Sig Start Date End Date Taking? Authorizing Provider  acetaminophen (TYLENOL) 500 MG tablet Take 500-1,000 mg by mouth every 6 (six) hours as needed for moderate pain.    Historical Provider, MD  allopurinol (ZYLOPRIM) 100 MG tablet Take 100 mg by mouth daily as needed (gout).  06/23/14 06/23/15  Historical Provider, MD  aspirin 325 MG tablet Take 325 mg by mouth every morning.    Historical Provider, MD  atorvastatin (LIPITOR) 80 MG tablet Take 1 tablet (80 mg total) by mouth daily. Patient taking differently: Take 80 mg  by mouth every morning.  04/20/14   Peter M [place], MD  carvedilol (COREG) 12.5 MG tablet Take 1 tablet (12.5 mg total) by mouth 2 (two) times daily. 09/23/13   Peter M [place], MD  chlorthalidone (HYGROTON) 25 MG tablet Take 25 mg by mouth every morning.  06/21/14 06/21/15  Historical Provider, MD  Cholecalciferol (VITAMIN D3) 2000 UNITS TABS Take 2,000 Units by mouth every morning.     Historical Provider, MD  cloNIDine (CATAPRES) 0.3 MG tablet Take 0.3 mg by mouth 2 (two) times daily.  12/30/14   Historical Provider, MD  Coenzyme Q10 200 MG capsule Take 200 mg by mouth every morning.     Historical Provider, MD  ferrous sulfate 325 (65 FE) MG tablet Take 325 mg by mouth daily with breakfast.    Historical  Provider, MD  furosemide (LASIX) 80 MG tablet Take 80 mg by mouth daily as needed for fluid.  09/21/14   Historical Provider, MD  hydrALAZINE (APRESOLINE) 50 MG tablet Take 50 mg by mouth 3 (three) times daily. 11/24/14   Historical Provider, MD  HYDROcodone-acetaminophen (NORCO) 7.5-325 MG per tablet Take 1 tablet by mouth 2 (two) times daily as needed for moderate pain.  07/20/14   Historical Provider, MD  insulin glargine (LANTUS) 100 UNIT/ML injection Inject 20 Units into the skin 2 (two) times daily.    Historical Provider, MD  isosorbide dinitrate (ISORDIL) 20 MG tablet Take 40 mg by mouth daily. 11/24/14   Historical Provider, MD  Liraglutide (VICTOZA [place]) Inject 1.8 Units into the skin every morning.     Historical Provider, MD  losartan (COZAAR) 100 MG tablet Take 100 mg by mouth every morning.  06/22/14 06/22/15  Historical Provider, MD  Magnesium 250 MG TABS Take 250 mg by mouth every morning.     Historical Provider, MD  Probiotic Product (PROBIOTIC DAILY PO) Take 1 tablet by mouth every morning.     Historical Provider, MD  vitamin B-12 (CYANOCOBALAMIN) 500 MCG tablet Take 500 mcg by mouth every morning.     Historical Provider, MD   BP 156/78 mmHg  Pulse 64  Temp(Src) 98 F (36.7 C) (Oral)  Resp 20  Ht 5\' 2"  (1.575 m)  Wt 220 lb (99.791 kg)  BMI 40.23 kg/m2  SpO2 99% Physical Exam  Constitutional: She is oriented to person, place, and time. She appears well-developed and well-nourished.  HENT:  Head: Normocephalic and atraumatic.  Eyes: Conjunctivae are normal. Pupils are equal, round, and reactive to light.  Neck: Neck supple. No tracheal deviation present. No thyromegaly present.  Cardiovascular: Normal rate and regular rhythm.   No murmur heard. Pulmonary/Chest: Effort normal and breath sounds normal.  Abdominal: Soft. Bowel sounds are normal. She exhibits no distension. There is no tenderness.  Obese  Musculoskeletal: Normal range of motion. She exhibits no edema or  tenderness.  Right lower extremity tender inguinal crease. Skin intact. She has pain at hip on internal or external rotation of thigh. Femoral and DP pulses 2+ bilaterally 1+ pretibial pitting edema bilaterally  Neurological: She is alert and oriented to person, place, and time. Coordination normal.  Skin: Skin is warm and dry. No rash noted.  Psychiatric: She has a normal mood and affect.  Nursing note and vitals reviewed.   ED Course  Procedures (including critical care time) Labs Review Labs Reviewed  CBG MONITORING, ED - Abnormal; Notable for the following:    Glucose-Capillary 233 (*)    All other components within normal limits  Imaging Review No results found. I have personally reviewed and evaluated these images and lab results as part of my medical decision-making.   EKG Interpretation None     4:05 PM pain is improved after treatment with intravenous morphine when I attempted to get patient up to walk she was able to stand but could not walk due to severe pain in her right groin. Results for orders placed or performed during the hospital encounter of [id number]  Basic metabolic panel  Result Value Ref Range   Sodium 142 135 - 145 mmol/L   Potassium 4.4 3.5 - 5.1 mmol/L   Chloride 108 101 - 111 mmol/L   CO2 20 (L) 22 - 32 mmol/L   Glucose, Bld 272 (H) 65 - 99 mg/dL   BUN 57 (H) 6 - 20 mg/dL   Creatinine, Ser 1.67 (H) 0.44 - 1.00 mg/dL   Calcium 9.3 8.9 - 10.3 mg/dL   GFR calc non Af Amer 30 (L) >60 mL/min   GFR calc Af Amer 35 (L) >60 mL/min   Anion gap 14 5 - 15  CBC with Differential/Platelet  Result Value Ref Range   WBC 8.6 4.0 - 10.5 K/uL   RBC 3.48 (L) 3.87 - 5.11 MIL/uL   Hemoglobin 10.0 (L) 12.0 - 15.0 g/dL   HCT 30.5 (L) 36.0 - 46.0 %   MCV 87.6 78.0 - 100.0 fL   MCH 28.7 26.0 - 34.0 pg   MCHC 32.8 30.0 - 36.0 g/dL   RDW 14.8 11.5 - 15.5 %   Platelets 251 150 - 400 K/uL   Neutrophils Relative % 81 %   Neutro Abs 6.9 1.7 - 7.7 K/uL   Lymphocytes  Relative 13 %   Lymphs Abs 1.1 0.7 - 4.0 K/uL   Monocytes Relative 6 %   Monocytes Absolute 0.5 0.1 - 1.0 K/uL   Eosinophils Relative 0 %   Eosinophils Absolute 0.0 0.0 - 0.7 K/uL   Basophils Relative 0 %   Basophils Absolute 0.0 0.0 - 0.1 K/uL  CBG monitoring, ED  Result Value Ref Range   Glucose-Capillary 233 (H) 65 - 99 mg/dL  CBG monitoring, ED  Result Value Ref Range   Glucose-Capillary 204 (H) 65 - 99 mg/dL   Ct Hip Right Wo Contrast  10/28/2015  CLINICAL DATA:  Right hip pain.  Suspicious for occult fracture. EXAM: CT OF THE RIGHT HIP WITHOUT CONTRAST TECHNIQUE: Multidetector CT imaging of the right hip was performed according to the standard protocol. Multiplanar CT image reconstructions were also generated. COMPARISON:  None. FINDINGS: No acute fracture or dislocation. No lytic or sclerotic osseous lesion. No periosteal reaction or bone destruction. Severe osteoarthritis of the right hip with severe superior joint space narrowing the bone-on-bone appearance, subchondral reactive marrow changes and marginal osteophytosis. Normal soft tissues. No muscle atrophy. No focal fluid collection or hematoma. Peripheral vascular atherosclerotic disease. Small calcified uterine masses likely representing uterine fibroids. IMPRESSION: 1. No acute osseous injury of the right hip. 2. Severe osteoarthritis of the right hip. Electronically Signed   By: Kathreen Devoid   On: 10/28/2015 15:26   Dg Hip Unilat With Pelvis 2-3 Views Right  10/28/2015  CLINICAL DATA:  70 year old female with a history of right hip pain. EXAM: DG HIP (WITH OR WITHOUT PELVIS) 2-3V RIGHT COMPARISON:  CT abdomen/ pelvis 05/01/2011 FINDINGS: Bony pelvic ring appears intact.  No acute fracture identified. Bilateral hips projects normally over the acetabula. Advanced changes of osteoarthritis of right greater than left hips  with joint space narrowing, bone-on-bone articulation, subchondral sclerosis and cyst formation. Dense vascular  calcifications. Surgical changes of the anatomic pelvis. IMPRESSION: Negative for acute bony abnormality. Advanced right greater than left hip osteoarthritis. Advanced atherosclerosis. Signed, Dulcy Fanny. Earleen Newport, DO [facility] Radiology Electronically Signed   By: Corrie Mckusick D.O.   On: 10/28/2015 12:58    MDM  I consulted with case management suggest 23 hour observation for pain control and to assess home health needs..pain is felt to be secondary to extensive degenerative arthritis and right hip Final diagnoses:  None  Dr. Daryll Drown from hospitalist service consulted. Will see patient in hospital. Plan 23 hour observation medical surgical floor. Plan pain control, patient may need temporary placement or additional needs. Anemia and renal insufficiency or chronic Diagnosis #1intractable pain of right hip. #2 hyperglycemia #3anemia #4 renal insufficiency      Orlie Dakin, MD 10/28/15 [phone]

## 2015-10-28 NOTE — ED Notes (Signed)
Bed: [id number] Expected date:  Expected time:  Means of arrival:  Comments: EMS-pain after doppler

## 2015-10-28 NOTE — ED Notes (Signed)
Patient transported to CT 

## 2015-10-28 NOTE — ED Notes (Addendum)
Per PTAR-states she had doppler done yesterday-states pain from where they used gel on right upper thigh-unable to bear weight-scan was negative

## 2015-10-28 NOTE — ED Notes (Signed)
MD at bedside. 

## 2015-10-29 ENCOUNTER — Observation Stay (HOSPITAL_COMMUNITY): Payer: Medicare Other

## 2015-10-29 DIAGNOSIS — E1122 Type 2 diabetes mellitus with diabetic chronic kidney disease: Secondary | ICD-10-CM

## 2015-10-29 DIAGNOSIS — M25551 Pain in right hip: Secondary | ICD-10-CM

## 2015-10-29 DIAGNOSIS — M25562 Pain in left knee: Secondary | ICD-10-CM

## 2015-10-29 DIAGNOSIS — N183 Chronic kidney disease, stage 3 unspecified: Secondary | ICD-10-CM

## 2015-10-29 DIAGNOSIS — N184 Chronic kidney disease, stage 4 (severe): Secondary | ICD-10-CM

## 2015-10-29 DIAGNOSIS — M10062 Idiopathic gout, left knee: Secondary | ICD-10-CM | POA: Diagnosis not present

## 2015-10-29 DIAGNOSIS — E119 Type 2 diabetes mellitus without complications: Secondary | ICD-10-CM | POA: Diagnosis not present

## 2015-10-29 LAB — GLUCOSE, CAPILLARY
GLUCOSE-CAPILLARY: 184 mg/dL — AB (ref 65–99)
GLUCOSE-CAPILLARY: 195 mg/dL — AB (ref 65–99)
GLUCOSE-CAPILLARY: 153 mg/dL — AB (ref 65–99)

## 2015-10-29 LAB — CBC
HEMATOCRIT: 29.6 % — AB (ref 36.0–46.0)
Hemoglobin: 9.6 g/dL — ABNORMAL LOW (ref 12.0–15.0)
MCH: 28.9 pg (ref 26.0–34.0)
MCHC: 32.4 g/dL (ref 30.0–36.0)
MCV: 89.2 fL (ref 78.0–100.0)
PLATELETS: 243 10*3/uL (ref 150–400)
RBC: 3.32 MIL/uL — AB (ref 3.87–5.11)
RDW: 15 % (ref 11.5–15.5)
WBC: 8.1 10*3/uL (ref 4.0–10.5)

## 2015-10-29 LAB — BASIC METABOLIC PANEL
Anion gap: 8 (ref 5–15)
BUN: 58 mg/dL — AB (ref 6–20)
CHLORIDE: 113 mmol/L — AB (ref 101–111)
CO2: 25 mmol/L (ref 22–32)
CREATININE: 2.02 mg/dL — AB (ref 0.44–1.00)
Calcium: 9.1 mg/dL (ref 8.9–10.3)
GFR, EST AFRICAN AMERICAN: 28 mL/min — AB (ref >60)
GFR, EST NON AFRICAN AMERICAN: 24 mL/min — AB (ref >60)
Glucose, Bld: 142 mg/dL — ABNORMAL HIGH (ref 65–99)
POTASSIUM: 4.8 mmol/L (ref 3.5–5.1)
SODIUM: 146 mmol/L — AB (ref 135–145)

## 2015-10-29 LAB — SYNOVIAL CELL COUNT + DIFF, W/ CRYSTALS
Eosinophils-Synovial: 0 % (ref 0–1)
LYMPHOCYTES-SYNOVIAL FLD: 0 % (ref 0–20)
Monocyte-Macrophage-Synovial Fluid: 2 % — ABNORMAL LOW (ref 50–90)
NEUTROPHIL, SYNOVIAL: 98 % — AB (ref 0–25)
WBC, Synovial: 36600 /mm3 — ABNORMAL HIGH (ref 0–200)

## 2015-10-29 MED ORDER — MORPHINE SULFATE (PF) 2 MG/ML IV SOLN
2.0000 mg | INTRAVENOUS | Status: DC | PRN
  Administered 2015-10-29: 2 mg via INTRAVENOUS
  Filled 2015-10-29: qty 1

## 2015-10-29 MED ORDER — METHYLPREDNISOLONE ACETATE 40 MG/ML IJ SUSP
40.0000 mg | Freq: Once | INTRAMUSCULAR | Status: AC
  Administered 2015-10-29: 40 mg via INTRAMUSCULAR
  Filled 2015-10-29: qty 1

## 2015-10-29 MED ORDER — BUPIVACAINE HCL (PF) 0.5 % IJ SOLN
30.0000 mL | Freq: Once | INTRAMUSCULAR | Status: AC
  Administered 2015-10-29: 10 mL
  Filled 2015-10-29: qty 30

## 2015-10-29 MED ORDER — LIDOCAINE HCL (PF) 0.5 % IJ SOLN
50.0000 mL | Freq: Once | INTRAMUSCULAR | Status: AC
  Administered 2015-10-29: 3 mL
  Filled 2015-10-29: qty 50

## 2015-10-29 NOTE — Care Management Obs Status (Signed)
[facility] NOTIFICATION   Patient Details  Name: Norma [name] MRN: [id number] Date of Birth: 11/12/1945   Medicare Observation Status Notification Given:  Yes    Erenest Rasher, RN 10/29/2015, 5:16 PM

## 2015-10-29 NOTE — Evaluation (Addendum)
Physical Therapy Evaluation Patient Details Name: Norma [name] MRN: [id number] DOB: [birth year] Today's Date: 10/29/2015   History of Present Illness  70 y.o. female with h/o DM, HLD, HTN, DJD, CAD, CKD 3-4 possible a flutter admitted with intractable R hip pain and difficulty walking. X ray showed advanced arthritis R hip. Pt now reports new onset of L knee pain.   Clinical Impression  Pt admitted with above diagnosis. Pt currently with functional limitations due to the deficits listed below (see PT Problem List). +2 assist for supine to sit, attempted sit to stand x 2 with +2 assist however pt unable to achieve standing position due to severe L knee pain. All mobility significantly limited by L knee pain. Pt notes she's had gout flares in the past.  Pt will benefit from skilled PT to increase their independence and safety with mobility to allow discharge to the venue listed below.       Follow Up Recommendations SNF (ST-SNF based on current functional status, hopeful for home if pain eases)    Equipment Recommendations  Wheelchair (measurements PT)    Recommendations for Other Services       Precautions / Restrictions Precautions Precautions: Fall Precaution Comments: pt denies h/o falls in past year Restrictions Weight Bearing Restrictions: No      Mobility  Bed Mobility Overal bed mobility: Needs Assistance;+2 for physical assistance Bed Mobility: Supine to Sit;Sit to Supine     Supine to sit: +2 for physical assistance;Max assist Sit to supine: +2 for physical assistance;Max assist   General bed mobility comments: max assist for advancing BLEs to EOB and back into bed, 9/10 pain L knee with movement; assist to raise trunk  Transfers Overall transfer level: Needs assistance   Transfers: Sit to/from Stand Sit to Stand: +2 physical assistance;Max assist         General transfer comment: verbal cues for hand placement, attempted to stand x 2 with RW and +2 assist,  pt unable to come to full upright position due to severe L knee pain  Ambulation/Gait                Stairs            Wheelchair Mobility    Modified Rankin (Stroke Patients Only)       Balance Overall balance assessment: Needs assistance   Sitting balance-Leahy Scale: Good     Standing balance support: Bilateral upper extremity supported Standing balance-Leahy Scale: Poor                               Pertinent Vitals/Pain Pain Assessment: 0-10 Pain Score: 9  Pain Location: L knee Pain Descriptors / Indicators: Sharp Pain Intervention(s): Monitored during session;Premedicated before session;Patient requesting pain meds-RN notified;Limited activity within patient's tolerance    Home Living Family/patient expects to be discharged to:: Private residence Living Arrangements: Alone   Type of Home: Apartment Home Access: Ramped entrance     Home Layout: One [place]: [place] - 4 wheels;Cane - single point;Grab bars - tub/shower;Shower seat;Bedside commode      Prior Function Level of Independence: Independent               Hand Dominance        Extremity/Trunk Assessment   Upper Extremity Assessment: Overall WFL for tasks assessed           Lower Extremity Assessment: LLE deficits/detail   LLE Deficits /  Details: pain with knee flexion, pt able to flex knee to 50*, ankle WNL, hip/knee assessment limited by pain, required assist to scoot LLE to edge of bed  Cervical / Trunk Assessment: Normal  Communication   Communication: No difficulties  Cognition Arousal/Alertness: Awake/alert Behavior During Therapy: WFL for tasks assessed/performed Overall Cognitive Status: Within Functional Limits for tasks assessed                      General Comments      Exercises        Assessment/Plan    PT Assessment Patient needs continued PT services  PT Diagnosis Difficulty walking;Acute pain   PT Problem  List Decreased strength;Decreased range of motion;Decreased activity tolerance;Pain;Decreased mobility;Obesity;Decreased balance  PT Treatment Interventions Gait training;DME instruction;Functional mobility training;Therapeutic activities;Patient/family education;Therapeutic exercise   PT Goals (Current goals can be found in the Care Plan section) Acute Rehab PT Goals Patient Stated Goal: return to independence PT Goal Formulation: With patient Time For Goal Achievement: 11/05/15 Potential to Achieve Goals: Good    Frequency Min 3X/week   Barriers to discharge Decreased caregiver support      Co-evaluation               End of Session Equipment Utilized During Treatment: Gait belt Activity Tolerance: Patient limited by pain Patient left: in bed;with bed alarm set;with call bell/phone within reach Nurse Communication: Mobility status    Functional Assessment Tool Used: clinical judgement Functional Limitation: Mobility: Walking and moving around Mobility: Walking and Moving Around Current Status [id number]): At least 80 percent but less than 100 percent impaired, limited or restricted Mobility: Walking and Moving Around Goal Status [phone]): At least 40 percent but less than 60 percent impaired, limited or restricted    Time: 1016-1032 PT Time Calculation (min) (ACUTE ONLY): 16 min   Charges:   PT Evaluation $PT Eval Moderate Complexity: 1 Procedure     PT G Codes:   PT G-Codes **NOT FOR INPATIENT CLASS** Functional Assessment Tool Used: clinical judgement Functional Limitation: Mobility: Walking and moving around Mobility: Walking and Moving Around Current Status [id number]): At least 80 percent but less than 100 percent impaired, limited or restricted Mobility: Walking and Moving Around Goal Status [phone]): At least 40 percent but less than 60 percent impaired, limited or restricted    Norma [name] 10/29/2015, 11:09 AM [phone]

## 2015-10-29 NOTE — Care Management Note (Signed)
Case Management Note  Patient Details  Name: Norma [name] MRN: [id number] Date of Birth: [birth year]  Subjective/Objective:   arthritis in the right hip                Action/Plan: Discharge Planning:  NCM spoke to pt. States she lives alone but her dtr, Kristeen Miss can come to assist her at home. Pt is interested in SNF for short term rehab. CSW referral for SNF-rehab. Pt states she has Rollator and 3n1 bedside commode at home. Offered choice for [facility]. Pt agreeable to [facility] for HH. Waiting final recommendation. Will contact CSW on Sunday with referral. Pt wants to see what bed offers are available.    Expected Discharge Date:               Expected Discharge Plan:  Skilled Nursing Facility  In-House Referral:  Clinical Social Work  Discharge planning Services  CM Consult  Post Acute Care Choice:  Home Health Choice offered to:  Patient  DME Arranged:  N/A DME Agency:  NA  HH Arranged:  PT [place] Agency:  [facility]  Status of Service:  In process, will continue to follow  Medicare Important Message Given:    Date Medicare IM Given:    Medicare IM give by:    Date Additional Medicare IM Given:    Additional Medicare Important Message give by:     If discussed at [place] of Stay Meetings, dates discussed:    Additional Comments:  Erenest Rasher, RN 10/29/2015, 5:13 PM

## 2015-10-29 NOTE — Progress Notes (Signed)
Critical results of  Synovial fluid called to Dr. Marlou Sa.

## 2015-10-29 NOTE — Progress Notes (Signed)
PROGRESS NOTE    Norma [name]  [id number] DOB: 1945/07/22 DOA: 10/28/2015  PCP: Bartholome Bill, MD  Outpatient Specialists: Dr Julien Nordmann    Brief Narrative:  Norma [name] is a 70 y.o. female with medical history significant of DM, HLD, HTN, DJD, CAD,  CKD 3-4 possible a flutter (supposed to be on a monitor now) presents with right hip pain limiting her ability to walk. She has chronic back pain for which she is on Vicodin for years but the hip has been bothering her for 2 wks now.   Assessment & Plan:   Principal Problem:   Right hip pain - CT reveals severe osteoarthritis- pain is in groin- passively lifting her leg exacerbates the pain - have requested ortho eval- Dr Marlou Sa recommends an MRI of her back which I have ordered - cont pain control  Active Problems:   Left knee pain - new complaint this AM- try voltaren gel- ortho to further evaluate    Hyperlipidemia   Hypertension - Chlorthalidone, Clonidine, Lasix, Hydralazine, Coreg, Losartan, Isordil  CAD -Imdur, Coreg, ASA 325 mg, Statin    Diabetes mellitus type 2, insulin dependent (HCC) - at home on Lantus 20 U BID- giving 10 U daily here with SSI - Vitoza on hold    CKD stage 3- 4  - stable   DVT prophylaxis: Heparin Code Status: full code Family Communication:  Disposition Plan: to be determined   Consultants:   ortho  Procedures:   none  Antimicrobials:  Anti-infectives    None      Subjective: Pain in right hip is better but now having pain in right knee. No nausea, vomiting, cough.   Objective: Filed Vitals:   10/28/15 1740 10/28/15 2130 10/29/15 0429 10/29/15 0810  BP: 132/40 179/48 155/52 166/41  Pulse:  59 61   Temp: 98.4 F (36.9 C) 98.6 F (37 C) 98.7 F (37.1 C)   TempSrc: Oral Oral Oral   Resp: 17 16 16    Height:      Weight:      SpO2: 99% 98% 93%     Intake/Output Summary (Last 24 hours) at 10/29/15 0957 Last data filed at 10/29/15 0427  Gross per 24 hour    Intake    480 ml  Output    650 ml  Net   -170 ml   Filed Weights   10/28/15 1054  Weight: 99.791 kg (220 lb)    Examination: General exam: Appears calm and comfortable  Respiratory system: Clear to auscultation. Respiratory effort normal. Cardiovascular system: S1 & S2 heard, RRR. No JVD, murmurs, rubs, gallops or clicks. No pedal edema. Gastrointestinal system: Abdomen is nondistended, soft and nontender. No organomegaly or masses felt. Normal bowel sounds heard. Central nervous system: Alert and oriented. No focal neurological deficits. Extremities: tender left knee, infero-medial compartment- lifting right leg caused pain in the hip Skin: No rashes, lesions or ulcers Psychiatry: Judgement and insight appear normal. Mood & affect appropriate.     Data Reviewed: I have personally reviewed following labs and imaging studies  CBC:  Recent Labs Lab 10/28/15 1149 10/29/15 0408  WBC 8.6 8.1  NEUTROABS 6.9  --   HGB 10.0* 9.6*  HCT 30.5* 29.6*  MCV 87.6 89.2  PLT 251 0000000   Basic Metabolic Panel:  Recent Labs Lab 10/28/15 1149 10/29/15 0408  NA 142 146*  K 4.4 4.8  CL 108 113*  CO2 20* 25  GLUCOSE 272* 142*  BUN 57* 58*  CREATININE 1.67* 2.02*  CALCIUM 9.3 9.1   GFR: Estimated Creatinine Clearance: 29 mL/min (by C-G formula based on Cr of 2.02). Liver Function Tests: No results for input(s): AST, ALT, ALKPHOS, BILITOT, PROT, ALBUMIN in the last 168 hours. No results for input(s): LIPASE, AMYLASE in the last 168 hours. No results for input(s): AMMONIA in the last 168 hours. Coagulation Profile: No results for input(s): INR, PROTIME in the last 168 hours. Cardiac Enzymes: No results for input(s): CKTOTAL, CKMB, CKMBINDEX, TROPONINI in the last 168 hours. BNP (last 3 results) No results for input(s): PROBNP in the last 8760 hours. HbA1C: No results for input(s): HGBA1C in the last 72 hours. CBG:  Recent Labs Lab 10/28/15 1059 10/28/15 1517  10/28/15 2129 10/29/15 0731  GLUCAP 233* 204* 203* 184*   Lipid Profile: No results for input(s): CHOL, HDL, LDLCALC, TRIG, CHOLHDL, LDLDIRECT in the last 72 hours. Thyroid Function Tests: No results for input(s): TSH, T4TOTAL, FREET4, T3FREE, THYROIDAB in the last 72 hours. Anemia Panel: No results for input(s): VITAMINB12, FOLATE, FERRITIN, TIBC, IRON, RETICCTPCT in the last 72 hours. Urine analysis:    Component Value Date/Time   COLORURINE YELLOW 09/17/2011 [place] 09/17/2011 2328   LABSPEC 1.021 09/17/2011 2328   PHURINE 5.0 09/17/2011 2328   GLUCOSEU >1000* 09/17/2011 2328   HGBUR NEGATIVE 09/17/2011 2328   BILIRUBINUR NEGATIVE 09/17/2011 2328   KETONESUR NEGATIVE 09/17/2011 2328   PROTEINUR NEGATIVE 09/17/2011 2328   UROBILINOGEN 0.2 09/17/2011 2328   NITRITE NEGATIVE 09/17/2011 2328   LEUKOCYTESUR TRACE* 09/17/2011 2328   Sepsis Labs: @LABRCNTIP (procalcitonin:4,lacticidven:4)  )No results found for this or any previous visit (from the past 240 hour(s)).       Radiology Studies: Ct Hip Right Wo Contrast  10/28/2015  CLINICAL DATA:  Right hip pain.  Suspicious for occult fracture. EXAM: CT OF THE RIGHT HIP WITHOUT CONTRAST TECHNIQUE: Multidetector CT imaging of the right hip was performed according to the standard protocol. Multiplanar CT image reconstructions were also generated. COMPARISON:  None. FINDINGS: No acute fracture or dislocation. No lytic or sclerotic osseous lesion. No periosteal reaction or bone destruction. Severe osteoarthritis of the right hip with severe superior joint space narrowing the bone-on-bone appearance, subchondral reactive marrow changes and marginal osteophytosis. Normal soft tissues. No muscle atrophy. No focal fluid collection or hematoma. Peripheral vascular atherosclerotic disease. Small calcified uterine masses likely representing uterine fibroids. IMPRESSION: 1. No acute osseous injury of the right hip. 2. Severe  osteoarthritis of the right hip. Electronically Signed   By: Kathreen Devoid   On: 10/28/2015 15:26   Dg Hip Unilat With Pelvis 2-3 Views Right  10/28/2015  CLINICAL DATA:  70 year old female with a history of right hip pain. EXAM: DG HIP (WITH OR WITHOUT PELVIS) 2-3V RIGHT COMPARISON:  CT abdomen/ pelvis 05/01/2011 FINDINGS: Bony pelvic ring appears intact.  No acute fracture identified. Bilateral hips projects normally over the acetabula. Advanced changes of osteoarthritis of right greater than left hips with joint space narrowing, bone-on-bone articulation, subchondral sclerosis and cyst formation. Dense vascular calcifications. Surgical changes of the anatomic pelvis. IMPRESSION: Negative for acute bony abnormality. Advanced right greater than left hip osteoarthritis. Advanced atherosclerosis. Signed, Dulcy Fanny. Earleen Newport, DO [facility] Radiology Electronically Signed   By: Corrie Mckusick D.O.   On: 10/28/2015 12:58        Scheduled Meds: . aspirin  325 mg Oral q morning - 10a  . atorvastatin  80 mg Oral q morning -  10a  . carvedilol  12.5 mg Oral BID WC  . chlorthalidone  25 mg Oral q morning - 10a  . cholecalciferol  2,000 Units Oral q morning - 10a  . cloNIDine  0.3 mg Oral BID  . docusate sodium  100 mg Oral BID  . ferrous sulfate  325 mg Oral Q breakfast  . furosemide  80 mg Oral Daily  . heparin  5,000 Units Subcutaneous Q8H  . hydrALAZINE  50 mg Oral TID  . insulin aspart  0-15 Units Subcutaneous TID WC  . insulin aspart  0-5 Units Subcutaneous QHS  . insulin glargine  10 Units Subcutaneous BID  . isosorbide dinitrate  40 mg Oral Daily  . losartan  100 mg Oral q morning - 10a   Continuous Infusions:       Time spent in minutes: [place], MD [facility] Pager: [email or web address] Password [facility] 10/29/2015, 9:57 AM

## 2015-10-29 NOTE — Consult Note (Signed)
Reason for Consult: Right hip and left knee pain Referring Physician: Dr. Gara Kroner Norma [name] is an 70 y.o. female.  HPI: Evalise natatory 70 year old female with history of long-standing back and right hip pain which worsened recently she states that currently her left knee is hurting her more and now the hip was.  Yesterday the hip was hurting worse.  She actually required a wheelchair which is getting around at church earlier this week but that was more for shortness of breath as opposed to actual pain according to the patient.  She does have a remote history of gout and used to be on out.  All but hasn't taken any medication and a while.  She denies any history of injury to the hip or knee.  She has significant medical comorbidities including history of CABG as well as aortobifem grafting.  She was on a Holter monitor to evaluate for atrial flutter but she had to take that off when she decided to come to the hospital.  She did recently see her cardiologist Dr. Peter Martinique he states he was in reasonably good health.  Ejection fraction 65% on study over a year-old  Past Medical History  Diagnosis Date  . Claudication ([place])   . Diabetes mellitus   . Hypertension   . Hyperlipidemia   . DJD (degenerative joint disease)   . PAD (peripheral artery disease) ([place])   . Coronary artery disease   . Leg pain   . Carotid artery occlusion   . Obesity   . DDD (degenerative disc disease)   . DJD (degenerative joint disease)   . Shortness of breath     exertion  . Anxiety   . Diabetic coma ([place])   . Edema   . CKD (chronic kidney disease) stage 3, GFR 30-59 ml/min 04/18/2014    Past Surgical History  Procedure Laterality Date  . Cardiac catheterization  12/01/2009    EF 65%  . Removal of fibrous cyst from right breast  10+ YEARS  . Retinal detachment surgery  10+ YEARS    LEFT EYE  . Coronary artery bypass graft  12/08/2009    LIMA GRAFT TO THE DISTAL LAD AND SAPHENOUS VEIN GRAFT TO THE OBTUSE  MARGINAL VESSEL  . Transthoracic echocardiogram  12/01/2009    EF 60-65%  . Cardiovascular stress test  11/28/2009    EF 75%  . Aorta - bilateral femoral artery bypass graft  05/25/2011    Procedure: AORTA BIFEMORAL BYPASS GRAFT;  Surgeon: Mal Misty, MD;  Location: [facility];  Service: Vascular;  Laterality: N/A;  . Pr vein bypass graft,aorto-fem-pop  05/25/11    Family History  Problem Relation Age of Onset  . Hypertension Mother   . Coronary artery disease Father   . Hypertension Sister   . Cirrhosis Brother   . Kidney disease Daughter     Social History:  reports that she quit smoking about 24 years ago. Her smoking use included Cigarettes. She quit after 20 years of use. She has never used smokeless tobacco. She reports that she does not drink alcohol or use illicit drugs.  Allergies:  Allergies  Allergen Reactions  . Iohexol      Code: RASH, Desc: pt called 1 day post scanning stating that skin was red and "itching all over" some what better but still had symptom.. instructed pt to take benadryl to relieve symptoms,per dr Alvester Chou.if any problems call back/mms, Onset Date: [id number]   . Sulfa Antibiotics Other (See Comments)  unknown    Medications: I have reviewed the patient's current medications.  Results for orders placed or performed during the hospital encounter of 10/28/15 (from the past 48 hour(s))  CBG monitoring, ED     Status: Abnormal   Collection Time: 10/28/15 10:59 AM  Result Value Ref Range   Glucose-Capillary 233 (H) 65 - 99 mg/dL  Basic metabolic panel     Status: Abnormal   Collection Time: 10/28/15 11:49 AM  Result Value Ref Range   Sodium 142 135 - 145 mmol/L   Potassium 4.4 3.5 - 5.1 mmol/L   Chloride 108 101 - 111 mmol/L   CO2 20 (L) 22 - 32 mmol/L   Glucose, Bld 272 (H) 65 - 99 mg/dL   BUN 57 (H) 6 - 20 mg/dL   Creatinine, Ser 1.67 (H) 0.44 - 1.00 mg/dL   Calcium 9.3 8.9 - 10.3 mg/dL   GFR calc non Af Amer 30 (L) >60 mL/min   GFR calc Af  Amer 35 (L) >60 mL/min    Comment: (NOTE) The eGFR has been calculated using the CKD EPI equation. This calculation has not been validated in all clinical situations. eGFR's persistently <60 mL/min signify possible Chronic Kidney Disease.    Anion gap 14 5 - 15  CBC with Differential/Platelet     Status: Abnormal   Collection Time: 10/28/15 11:49 AM  Result Value Ref Range   WBC 8.6 4.0 - 10.5 K/uL   RBC 3.48 (L) 3.87 - 5.11 MIL/uL   Hemoglobin 10.0 (L) 12.0 - 15.0 g/dL   HCT 30.5 (L) 36.0 - 46.0 %   MCV 87.6 78.0 - 100.0 fL   MCH 28.7 26.0 - 34.0 pg   MCHC 32.8 30.0 - 36.0 g/dL   RDW 14.8 11.5 - 15.5 %   Platelets 251 150 - 400 K/uL   Neutrophils Relative % 81 %   Neutro Abs 6.9 1.7 - 7.7 K/uL   Lymphocytes Relative 13 %   Lymphs Abs 1.1 0.7 - 4.0 K/uL   Monocytes Relative 6 %   Monocytes Absolute 0.5 0.1 - 1.0 K/uL   Eosinophils Relative 0 %   Eosinophils Absolute 0.0 0.0 - 0.7 K/uL   Basophils Relative 0 %   Basophils Absolute 0.0 0.0 - 0.1 K/uL  CBG monitoring, ED     Status: Abnormal   Collection Time: 10/28/15  3:17 PM  Result Value Ref Range   Glucose-Capillary 204 (H) 65 - 99 mg/dL  Glucose, capillary     Status: Abnormal   Collection Time: 10/28/15  9:29 PM  Result Value Ref Range   Glucose-Capillary 203 (H) 65 - 99 mg/dL  Basic metabolic panel     Status: Abnormal   Collection Time: 10/29/15  4:08 AM  Result Value Ref Range   Sodium 146 (H) 135 - 145 mmol/L   Potassium 4.8 3.5 - 5.1 mmol/L   Chloride 113 (H) 101 - 111 mmol/L   CO2 25 22 - 32 mmol/L   Glucose, Bld 142 (H) 65 - 99 mg/dL   BUN 58 (H) 6 - 20 mg/dL   Creatinine, Ser 2.02 (H) 0.44 - 1.00 mg/dL   Calcium 9.1 8.9 - 10.3 mg/dL   GFR calc non Af Amer 24 (L) >60 mL/min   GFR calc Af Amer 28 (L) >60 mL/min    Comment: (NOTE) The eGFR has been calculated using the CKD EPI equation. This calculation has not been validated in all clinical situations. eGFR's persistently <60 mL/min signify possible  Chronic Kidney Disease.    Anion gap 8 5 - 15  CBC     Status: Abnormal   Collection Time: 10/29/15  4:08 AM  Result Value Ref Range   WBC 8.1 4.0 - 10.5 K/uL   RBC 3.32 (L) 3.87 - 5.11 MIL/uL   Hemoglobin 9.6 (L) 12.0 - 15.0 g/dL   HCT 29.6 (L) 36.0 - 46.0 %   MCV 89.2 78.0 - 100.0 fL   MCH 28.9 26.0 - 34.0 pg   MCHC 32.4 30.0 - 36.0 g/dL   RDW 15.0 11.5 - 15.5 %   Platelets 243 150 - 400 K/uL  Glucose, capillary     Status: Abnormal   Collection Time: 10/29/15  7:31 AM  Result Value Ref Range   Glucose-Capillary 184 (H) 65 - 99 mg/dL  Glucose, capillary     Status: Abnormal   Collection Time: 10/29/15 12:19 PM  Result Value Ref Range   Glucose-Capillary 195 (H) 65 - 99 mg/dL    Mr Lumbar Spine Wo Contrast  10/29/2015  CLINICAL DATA:  Chronic low back pain with onset of right hip pain 2 weeks ago. Initial encounter. EXAM: MRI LUMBAR SPINE WITHOUT CONTRAST TECHNIQUE: Multiplanar, multisequence MR imaging of the lumbar spine was performed. No intravenous contrast was administered. COMPARISON:  Plain films and CT scan of the right hip 10/28/2015. FINDINGS: Vertebral body height and alignment are maintained. Scattered hemangiomas are seen. There is some degenerative endplate signal change most notable anteriorly at L2-3. No worrisome marrow lesion. The conus medullaris is normal in signal and position. Imaged intra-abdominal contents demonstrate multiple small uterine fibroids. T11-12 and T12-L1 are imaged in the sagittal plane only and negative. L1-2: There is a small right lateral recess disc protrusion with cephalad extension. The disc causes mild narrowing in the right lateral recess without nerve root compression. The foramina are widely patent. L2-3: Shallow disc bulge with mild ligamentum flavum thickening and facet arthropathy. The central canal and foramina remain open. L3-4: Moderate facet arthropathy with some ligamentum flavum thickening. The central canal and foramina are open.  L4-5: Moderate facet arthropathy with ligamentum flavum thickening and a very shallow disc bulge. The central canal and foramina are widely patent. L5-S1: Bilateral facet degenerative change and a minimal disc bulge without central canal or foraminal narrowing. IMPRESSION: No acute abnormality or finding to explain the patient's symptoms. Minimal narrowing the right lateral recess at L1-2 without nerve root compression is noted. There is also multilevel facet arthropathy. Electronically Signed   By: Inge Rise M.D.   On: 10/29/2015 12:15   Ct Hip Right Wo Contrast  10/28/2015  CLINICAL DATA:  Right hip pain.  Suspicious for occult fracture. EXAM: CT OF THE RIGHT HIP WITHOUT CONTRAST TECHNIQUE: Multidetector CT imaging of the right hip was performed according to the standard protocol. Multiplanar CT image reconstructions were also generated. COMPARISON:  None. FINDINGS: No acute fracture or dislocation. No lytic or sclerotic osseous lesion. No periosteal reaction or bone destruction. Severe osteoarthritis of the right hip with severe superior joint space narrowing the bone-on-bone appearance, subchondral reactive marrow changes and marginal osteophytosis. Normal soft tissues. No muscle atrophy. No focal fluid collection or hematoma. Peripheral vascular atherosclerotic disease. Small calcified uterine masses likely representing uterine fibroids. IMPRESSION: 1. No acute osseous injury of the right hip. 2. Severe osteoarthritis of the right hip. Electronically Signed   By: Kathreen Devoid   On: 10/28/2015 15:26   Dg Hip Unilat With Pelvis 2-3 Views Right  10/28/2015  CLINICAL DATA:  70 year old female with a history of right hip pain. EXAM: DG HIP (WITH OR WITHOUT PELVIS) 2-3V RIGHT COMPARISON:  CT abdomen/ pelvis 05/01/2011 FINDINGS: Bony pelvic ring appears intact.  No acute fracture identified. Bilateral hips projects normally over the acetabula. Advanced changes of osteoarthritis of right greater than left  hips with joint space narrowing, bone-on-bone articulation, subchondral sclerosis and cyst formation. Dense vascular calcifications. Surgical changes of the anatomic pelvis. IMPRESSION: Negative for acute bony abnormality. Advanced right greater than left hip osteoarthritis. Advanced atherosclerosis. Signed, Dulcy Fanny. Earleen Newport, DO [facility] Electronically Signed   By: Corrie Mckusick D.O.   On: 10/28/2015 12:58    Review of Systems  Constitutional: Negative.   Eyes: Negative.   Respiratory: Negative.   Cardiovascular: Negative.   Gastrointestinal: Negative.   Genitourinary: Negative.   Musculoskeletal: Positive for joint pain.  Skin: Negative.   Neurological: Negative.   Endo/Heme/Allergies: Negative.   Psychiatric/Behavioral: Negative.    Blood pressure 127/34, pulse 59, temperature 99.5 F (37.5 C), temperature source Oral, resp. rate 20, height _0  (1.575 m), weight 99.791 kg (220 lb), SpO2 94 %. Physical Exam  Constitutional: She appears well-developed.  HENT:  Head: Normocephalic.  Eyes: Pupils are equal, round, and reactive to light.  Neck: Normal range of motion.  Cardiovascular: Normal rate.   Respiratory: Effort normal.  Neurological: She is alert.  Skin: Skin is warm.  Psychiatric: She has a normal mood and affect.   examination of the left knee demonstrates significant warmth to the knee with an effusion.  Pain with range of motion is present.  Both feet are perfused but pulses are diminished.  No groin pain with internal/external rotation on the left-hand side patient has mild pain with internal/external rotation of the right hand side and no right-sided knee effusion.  Ankle dorsi flexion and plantar flexion is intact.  Trace pitting edema present in bilateral lower extremities no nerve root tension signs present  Assessment/Plan: Impression is right hip arthritis which is likely long-standing.  CT scan confirms  significant arthritis in the right hip.  Patient states that as of a week ago she was able to ambulate with a cane.  Does not appear to be an acute fracture either on CT scan or on exam.  She states her hip pain is actually a little bit better since she's been in the hospital has been on morphine.  In regards to the hip I think she will likely need elective hip replacement sometime in the future.  She will need cardiac risk stratification prior to that.  MRI scan of the lumbar spine is negative for lateralizing nerve compression.  On the right-hand side.  Also in regard to the right hip she could benefit from fluoroscopically guided hip injection. thatI can be done either Sunday or Monday.  In regards to her left knee is likely that this is a gout flare.  Plan for that is aspiration of the fluid to send for crystal analysis with subsequent injection of Marcaine and Depo-Medrol.  Risks and benefits discussed timeout called prior to the procedure and procedures performed through a superolateral approach after sterile prep and drape and numbing lidocaine and aspirated about 20 mL of fluid and sent that for crystal analysis as well as cell count and Gram stain and aerobic and anaerobic culture May need gout medicine on discharge  DEAN,GREGORY SCOTT 10/29/2015, 2:48 PM

## 2015-10-30 ENCOUNTER — Observation Stay (HOSPITAL_COMMUNITY): Payer: Medicare Other

## 2015-10-30 DIAGNOSIS — M10062 Idiopathic gout, left knee: Secondary | ICD-10-CM

## 2015-10-30 DIAGNOSIS — E119 Type 2 diabetes mellitus without complications: Secondary | ICD-10-CM | POA: Diagnosis not present

## 2015-10-30 DIAGNOSIS — M25551 Pain in right hip: Secondary | ICD-10-CM | POA: Diagnosis not present

## 2015-10-30 DIAGNOSIS — Z794 Long term (current) use of insulin: Secondary | ICD-10-CM

## 2015-10-30 DIAGNOSIS — N184 Chronic kidney disease, stage 4 (severe): Secondary | ICD-10-CM | POA: Diagnosis not present

## 2015-10-30 LAB — BASIC METABOLIC PANEL
ANION GAP: 9 (ref 5–15)
BUN: 75 mg/dL — ABNORMAL HIGH (ref 6–20)
CALCIUM: 8.8 mg/dL — AB (ref 8.9–10.3)
CHLORIDE: 109 mmol/L (ref 101–111)
CO2: 25 mmol/L (ref 22–32)
Creatinine, Ser: 2.49 mg/dL — ABNORMAL HIGH (ref 0.44–1.00)
GFR calc non Af Amer: 19 mL/min — ABNORMAL LOW (ref >60)
GFR, EST AFRICAN AMERICAN: 22 mL/min — AB (ref >60)
Glucose, Bld: 198 mg/dL — ABNORMAL HIGH (ref 65–99)
Potassium: 5.1 mmol/L (ref 3.5–5.1)
Sodium: 143 mmol/L (ref 135–145)

## 2015-10-30 LAB — GLUCOSE, CAPILLARY
GLUCOSE-CAPILLARY: 172 mg/dL — AB (ref 65–99)
GLUCOSE-CAPILLARY: 211 mg/dL — AB (ref 65–99)
Glucose-Capillary: 255 mg/dL — ABNORMAL HIGH (ref 65–99)
Glucose-Capillary: 245 mg/dL — ABNORMAL HIGH (ref 65–99)
Glucose-Capillary: 181 mg/dL — ABNORMAL HIGH (ref 65–99)

## 2015-10-30 MED ORDER — METHYLPREDNISOLONE ACETATE 80 MG/ML IJ SUSP
80.0000 mg | Freq: Once | INTRAMUSCULAR | Status: DC
  Filled 2015-10-30: qty 1

## 2015-10-30 MED ORDER — BISACODYL 5 MG PO TBEC
10.0000 mg | DELAYED_RELEASE_TABLET | Freq: Once | ORAL | Status: AC
  Administered 2015-10-30: 10 mg via ORAL
  Filled 2015-10-30: qty 2

## 2015-10-30 MED ORDER — INSULIN GLARGINE 100 UNIT/ML ~~LOC~~ SOLN: 20.0000 [IU] | Freq: Two times a day (BID) | SUBCUTANEOUS | Status: DC

## 2015-10-30 MED ORDER — INSULIN ASPART 100 UNIT/ML ~~LOC~~ SOLN
0.0000 [IU] | Freq: Three times a day (TID) | SUBCUTANEOUS | Status: DC
  Administered 2015-10-30: 4 [IU] via SUBCUTANEOUS
  Administered 2015-10-31: 15 [IU] via SUBCUTANEOUS
  Administered 2015-10-31: 7 [IU] via SUBCUTANEOUS
  Administered 2015-11-01: 4 [IU] via SUBCUTANEOUS
  Administered 2015-11-01: 3 [IU] via SUBCUTANEOUS

## 2015-10-30 MED ORDER — INSULIN ASPART 100 UNIT/ML ~~LOC~~ SOLN: 0.0000 [IU] | Freq: Every day | SUBCUTANEOUS | Status: DC

## 2015-10-30 MED ORDER — INSULIN GLARGINE 100 UNIT/ML ~~LOC~~ SOLN
18.0000 [IU] | Freq: Two times a day (BID) | SUBCUTANEOUS | Status: DC
  Administered 2015-10-30 – 2015-10-31 (×2): 18 [IU] via SUBCUTANEOUS
  Filled 2015-10-30 (×2): qty 0.18

## 2015-10-30 NOTE — Progress Notes (Signed)
Patient has gout and left knee by Crystal examination Doing some better today after aspiration and injection with cortisone yesterday Discussed with Dr. Vernard Gambles right hip injection under fluoroscopy Right hip has had arthritis for many years it may also be affected by her current gout I would favor right hip injection with mobilization and attempt at elective hip replacement. Later We will see how she mobilizes today She needs to be off subcutaneous heparin for 12-18 hours prior to hip injection per Dr. Vernard Gambles

## 2015-10-30 NOTE — NC FL2 (Signed)
[place] MEDICAID FL2 LEVEL OF CARE SCREENING TOOL     IDENTIFICATION  Patient Name: Norma [name] Birthdate: Aug 31, 1945 Sex: female Admission Date (Current Location): 10/28/2015  [facility] and [place] Number:  Herbalist and Address:  [facility],  [place] [address], [place]      Provider Number: [id number]  Attending Physician Name and Address:  Debbe Odea, MD  Relative Name and Phone Number:       Current Level of Care: Hospital Recommended Level of Care: [place] Prior Approval Number:    Date Approved/Denied:   PASRR Number: [id number]  Discharge Plan: SNF    Current Diagnoses: Patient Active Problem List   Diagnosis Date Noted  . Right hip pain 10/29/2015  . Left knee pain 10/29/2015  . DM type 2 causing CKD stage 3 ([place]) 10/29/2015  . Morbid obesity ([place]) 10/29/2015  . CKD (chronic kidney disease) stage 4, GFR 15-29 ml/min ([facility]) 04/18/2014  . Aortic atherosclerosis ([place]) 04/18/2014  . Bilateral carotid bruits 04/16/2014  . Pulsatile tinnitus of right ear 04/16/2014  . Diarrhea 09/18/2011  . Peripheral vascular disease, unspecified ([place]) 08/14/2011  . Claudication ([place]) 08/14/2011  . Edema 06/29/2011  . Leucocytosis 06/12/2011  . Acute on chronic kidney disease, stage 4 05/28/2011  . Respiratory failure, acute ([place]) 05/25/2011    Class: Acute  . S/P aorto-bifemoral bypass surgery 05/25/2011    Class: Acute  . PAD (peripheral artery disease) ([place])   . Coronary artery disease   . Hyperlipidemia   . Hypertension     Orientation RESPIRATION BLADDER Height & Weight     Self, Time, Situation, Place  Normal Continent Weight: 220 lb (99.791 kg) Height:  5\' 2"  (157.5 cm)  BEHAVIORAL SYMPTOMS/MOOD NEUROLOGICAL BOWEL NUTRITION STATUS      Continent Diet (Please see discharge summary.)  AMBULATORY STATUS COMMUNICATION OF NEEDS Skin    (Has not ambulated with PT) Verbally Normal                        Personal Care Assistance Level of Assistance  Bathing, Feeding, Dressing Bathing Assistance: Limited assistance Feeding assistance: Independent Dressing Assistance: Limited assistance     Functional Limitations Info             SPECIAL CARE FACTORS FREQUENCY  PT (By licensed PT), OT (By licensed OT)     PT Frequency: 5 OT Frequency: 5            Contractures      Additional Factors Info  Code Status, Allergies Code Status Info: FULL Allergies Info: Iohexol, Sulfa Antibiotics           Current Medications (10/30/2015):  This is the current hospital active medication list Current Facility-Administered Medications  Medication Dose Route Frequency Provider Last Rate Last Dose  . acetaminophen (TYLENOL) tablet 500-1,000 mg  500-1,000 mg Oral Q6H PRN Sid Falcon, MD   500 mg at 10/29/15 2316  . aspirin tablet 325 mg  325 mg Oral q morning - 10a Sid Falcon, MD   325 mg at 10/30/15 0908  . atorvastatin (LIPITOR) tablet 80 mg  80 mg Oral q morning - 10a Sid Falcon, MD   80 mg at 10/30/15 0907  . bisacodyl (DULCOLAX) EC tablet 10 mg  10 mg Oral Once Debbe Odea, MD      . cholecalciferol (VITAMIN D) tablet 2,000 Units  2,000 Units Oral q morning - 10a Raquel Sarna  Marsh Dolly, MD   2,000 Units at 10/30/15 0908  . cloNIDine (CATAPRES) tablet 0.3 mg  0.3 mg Oral BID Sid Falcon, MD   0.3 mg at 10/30/15 0908  . cyclobenzaprine (FLEXERIL) tablet 5 mg  5 mg Oral TID PRN Ritta Slot, NP   5 mg at 10/29/15 1853  . docusate sodium (COLACE) capsule 100 mg  100 mg Oral BID Sid Falcon, MD   100 mg at 10/30/15 0912  . ferrous sulfate tablet 325 mg  325 mg Oral Q breakfast Sid Falcon, MD   325 mg at 10/30/15 0900  . insulin aspart (novoLOG) injection 0-20 Units  0-20 Units Subcutaneous TID WC Saima Rizwan, MD      . insulin aspart (novoLOG) injection 0-5 Units  0-5 Units Subcutaneous QHS Sid Falcon, MD   3 Units at 10/29/15 2318  . insulin glargine (LANTUS) injection  18 Units  18 Units Subcutaneous BID Debbe Odea, MD      . isosorbide dinitrate (ISORDIL) tablet 40 mg  40 mg Oral Daily Sid Falcon, MD   40 mg at 10/30/15 0907  . losartan (COZAAR) tablet 100 mg  100 mg Oral q morning - 10a Sid Falcon, MD   100 mg at 10/30/15 0907  . methylPREDNISolone acetate (DEPO-MEDROL) injection 80 mg  80 mg Intra-articular Once Arne Cleveland, MD   80 mg at 10/30/15 1455  . morphine 2 MG/ML injection 2 mg  2 mg Intravenous Q4H PRN Debbe Odea, MD   2 mg at 10/29/15 1107  . ondansetron (ZOFRAN) tablet 4 mg  4 mg Oral Q6H PRN Sid Falcon, MD       Or  . ondansetron [facility]) injection 4 mg  4 mg Intravenous Q6H PRN Sid Falcon, MD      . oxyCODONE (Oxy IR/ROXICODONE) immediate release tablet 5 mg  5 mg Oral Q4H PRN Sid Falcon, MD   5 mg at 10/29/15 1236     Discharge Medications: Please see discharge summary for a list of discharge medications.  Relevant Imaging Results:  Relevant Lab Results:   Additional Information SSN: [id number]  Caroline Sauger, LCSW

## 2015-10-30 NOTE — Progress Notes (Addendum)
PROGRESS NOTE    Norma [name]  [id number] DOB: [birth year] DOA: 10/28/2015  PCP: Bartholome Bill, MD  Outpatient Specialists: Dr Julien Nordmann    Brief Narrative:  Norma [name] is a 70 y.o. female with medical history significant of DM, HLD, HTN, DJD, CAD,  CKD 3-4 possible a flutter (supposed to be on a monitor now) presents with right hip pain limiting her ability to walk. She has chronic back pain for which she is on Vicodin for years but the hip has been bothering her for 2 wks now.   Assessment & Plan:   Principal Problem:   Right hip pain - CT reveals severe osteoarthritis- pain is in groin- passively lifting her leg exacerbates the pain - have requested ortho eval- Dr Marlou Sa recommends an MRI of her back - this is negative for any etiology - he notes she has gout in her left knee and may have gout in the hip as well- recommends IR guided injection (which should help with both gout and osteoarthritis) and possible hip replacement later- d/w Dr Vernard Gambles with IR who will arrange injection  Active Problems:   Left knee pain - likely due to number of diuretics mentioned below -Dr Marlou Sa managing- knee aspirated>> gout- given DepoMedrol injection - will place on Allopurinol when discharged  Constipation - takes oral dulcolax at home- have ordered- cont colace    Hypertension - Chlorthalidone, Clonidine, Lasix, Hydralazine, Coreg, Losartan, Isordil  CAD -Imdur, Coreg, ASA 325 mg, Statin    Diabetes mellitus type 2, insulin dependent (HCC) - at home on Lantus 20 U BID- giving 10 U daily here with SSI- sugars rising- increase Lantus and SSI - Vitoza on hold    CKD stage 3- 4  - Cr slightly elevated- hold Chlorthalidone and Lasix (already received today)   DVT prophylaxis: Heparin Code Status: full code Family Communication:  Disposition Plan: to be determined   Consultants:   ortho  Procedures:   none  Antimicrobials:  Anti-infectives    None       Subjective: Pain in left knee is better but not resolved. Right his a bit better with narcotics. Consitpation. No vomiting or abdominal pain  Objective: Filed Vitals:   10/29/15 1645 10/29/15 2100 10/30/15 0452 10/30/15 0907  BP: 106/38 152/49 163/44 154/44  Pulse: 51 50 58   Temp:  98 F (36.7 C) 98.8 F (37.1 C)   TempSrc:   Oral   Resp:  18 18   Height:      Weight:      SpO2:  97% 99%     Intake/Output Summary (Last 24 hours) at 10/30/15 1312 Last data filed at 10/30/15 1244  Gross per 24 hour  Intake    720 ml  Output   1010 ml  Net   -290 ml   Filed Weights   10/28/15 1054  Weight: 99.791 kg (220 lb)    Examination: General exam: Appears calm and comfortable  Respiratory system: Clear to auscultation. Respiratory effort normal. Cardiovascular system: S1 & S2 heard, RRR. No JVD, murmurs, rubs, gallops or clicks. No pedal edema. Gastrointestinal system: Abdomen is nondistended, soft and nontender. No organomegaly or masses felt. Normal bowel sounds heard. Central nervous system: Alert and oriented. No focal neurological deficits. Extremities: left knee in ace bandage- able to flex knee slightly- lifting right leg causes less pain in the hip today Skin: No rashes, lesions or ulcers Psychiatry: Judgement and insight appear normal. Mood & affect appropriate.  Data Reviewed: I have personally reviewed following labs and imaging studies  CBC:  Recent Labs Lab 10/28/15 1149 10/29/15 0408  WBC 8.6 8.1  NEUTROABS 6.9  --   HGB 10.0* 9.6*  HCT 30.5* 29.6*  MCV 87.6 89.2  PLT 251 0000000   Basic Metabolic Panel:  Recent Labs Lab 10/28/15 1149 10/29/15 0408 10/30/15 0414  NA 142 146* 143  K 4.4 4.8 5.1  CL 108 113* 109  CO2 20* 25 25  GLUCOSE 272* 142* 198*  BUN 57* 58* 75*  CREATININE 1.67* 2.02* 2.49*  CALCIUM 9.3 9.1 8.8*   GFR: Estimated Creatinine Clearance: 23.6 mL/min (by C-G formula based on Cr of 2.49). Liver Function Tests: No  results for input(s): AST, ALT, ALKPHOS, BILITOT, PROT, ALBUMIN in the last 168 hours. No results for input(s): LIPASE, AMYLASE in the last 168 hours. No results for input(s): AMMONIA in the last 168 hours. Coagulation Profile: No results for input(s): INR, PROTIME in the last 168 hours. Cardiac Enzymes: No results for input(s): CKTOTAL, CKMB, CKMBINDEX, TROPONINI in the last 168 hours. BNP (last 3 results) No results for input(s): PROBNP in the last 8760 hours. HbA1C: No results for input(s): HGBA1C in the last 72 hours. CBG:  Recent Labs Lab 10/29/15 1219 10/29/15 1646 10/29/15 2133 10/30/15 0721 10/30/15 1157  GLUCAP 195* 153* 255* 172* 245*   Lipid Profile: No results for input(s): CHOL, HDL, LDLCALC, TRIG, CHOLHDL, LDLDIRECT in the last 72 hours. Thyroid Function Tests: No results for input(s): TSH, T4TOTAL, FREET4, T3FREE, THYROIDAB in the last 72 hours. Anemia Panel: No results for input(s): VITAMINB12, FOLATE, FERRITIN, TIBC, IRON, RETICCTPCT in the last 72 hours. Urine analysis:    Component Value Date/Time   COLORURINE YELLOW 09/17/2011 [place] 09/17/2011 2328   LABSPEC 1.021 09/17/2011 2328   PHURINE 5.0 09/17/2011 2328   GLUCOSEU >1000* 09/17/2011 2328   HGBUR NEGATIVE 09/17/2011 2328   BILIRUBINUR NEGATIVE 09/17/2011 2328   KETONESUR NEGATIVE 09/17/2011 2328   PROTEINUR NEGATIVE 09/17/2011 2328   UROBILINOGEN 0.2 09/17/2011 2328   NITRITE NEGATIVE 09/17/2011 2328   LEUKOCYTESUR TRACE* 09/17/2011 2328   Sepsis Labs: @LABRCNTIP (procalcitonin:4,lacticidven:4)  ) Recent Results (from the past 240 hour(s))  Body fluid culture     Status: None (Preliminary result)   Collection Time: 10/29/15  3:33 PM  Result Value Ref Range Status   Specimen Description SYNOVIAL  Final   Special Requests Normal  Final   Gram Stain   Final    WBC PRESENT, PREDOMINANTLY PMN NO ORGANISMS SEEN Gram Stain Report Called to,Read Back By and Verified With:  CHEEK,K AT [id number] ON [id number] BY HOOKER,B    Culture   Final    NO GROWTH < 24 HOURS Performed at [facility]    Report Status PENDING  Incomplete         Radiology Studies: Mr Lumbar Spine Wo Contrast  10/29/2015  CLINICAL DATA:  Chronic low back pain with onset of right hip pain 2 weeks ago. Initial encounter. EXAM: MRI LUMBAR SPINE WITHOUT CONTRAST TECHNIQUE: Multiplanar, multisequence MR imaging of the lumbar spine was performed. No intravenous contrast was administered. COMPARISON:  Plain films and CT scan of the right hip 10/28/2015. FINDINGS: Vertebral body height and alignment are maintained. Scattered hemangiomas are seen. There is some degenerative endplate signal change most notable anteriorly at L2-3. No worrisome marrow lesion. The conus medullaris is normal in signal and position. Imaged intra-abdominal contents demonstrate multiple small uterine fibroids. T11-12 and  T12-L1 are imaged in the sagittal plane only and negative. L1-2: There is a small right lateral recess disc protrusion with cephalad extension. The disc causes mild narrowing in the right lateral recess without nerve root compression. The foramina are widely patent. L2-3: Shallow disc bulge with mild ligamentum flavum thickening and facet arthropathy. The central canal and foramina remain open. L3-4: Moderate facet arthropathy with some ligamentum flavum thickening. The central canal and foramina are open. L4-5: Moderate facet arthropathy with ligamentum flavum thickening and a very shallow disc bulge. The central canal and foramina are widely patent. L5-S1: Bilateral facet degenerative change and a minimal disc bulge without central canal or foraminal narrowing. IMPRESSION: No acute abnormality or finding to explain the patient's symptoms. Minimal narrowing the right lateral recess at L1-2 without nerve root compression is noted. There is also multilevel facet arthropathy. Electronically Signed   By: Inge Rise  M.D.   On: 10/29/2015 12:15   Ct Hip Right Wo Contrast  10/28/2015  CLINICAL DATA:  Right hip pain.  Suspicious for occult fracture. EXAM: CT OF THE RIGHT HIP WITHOUT CONTRAST TECHNIQUE: Multidetector CT imaging of the right hip was performed according to the standard protocol. Multiplanar CT image reconstructions were also generated. COMPARISON:  None. FINDINGS: No acute fracture or dislocation. No lytic or sclerotic osseous lesion. No periosteal reaction or bone destruction. Severe osteoarthritis of the right hip with severe superior joint space narrowing the bone-on-bone appearance, subchondral reactive marrow changes and marginal osteophytosis. Normal soft tissues. No muscle atrophy. No focal fluid collection or hematoma. Peripheral vascular atherosclerotic disease. Small calcified uterine masses likely representing uterine fibroids. IMPRESSION: 1. No acute osseous injury of the right hip. 2. Severe osteoarthritis of the right hip. Electronically Signed   By: Kathreen Devoid   On: 10/28/2015 15:26        Scheduled Meds: . aspirin  325 mg Oral q morning - 10a  . atorvastatin  80 mg Oral q morning - 10a  . bisacodyl  10 mg Oral Once  . chlorthalidone  25 mg Oral q morning - 10a  . cholecalciferol  2,000 Units Oral q morning - 10a  . cloNIDine  0.3 mg Oral BID  . docusate sodium  100 mg Oral BID  . ferrous sulfate  325 mg Oral Q breakfast  . furosemide  80 mg Oral Daily  . insulin aspart  0-15 Units Subcutaneous TID WC  . insulin aspart  0-5 Units Subcutaneous QHS  . insulin glargine  10 Units Subcutaneous BID  . isosorbide dinitrate  40 mg Oral Daily  . losartan  100 mg Oral q morning - 10a  . methylPREDNISolone acetate  80 mg Intra-articular Once   Continuous Infusions:       Time spent in minutes: [place], MD [facility] Pager: [email or web address] Password [email or web address] 10/30/2015, 1:12 PM

## 2015-10-31 ENCOUNTER — Observation Stay (HOSPITAL_COMMUNITY): Payer: Medicare Other

## 2015-10-31 DIAGNOSIS — N184 Chronic kidney disease, stage 4 (severe): Secondary | ICD-10-CM | POA: Diagnosis not present

## 2015-10-31 DIAGNOSIS — E1122 Type 2 diabetes mellitus with diabetic chronic kidney disease: Secondary | ICD-10-CM | POA: Diagnosis not present

## 2015-10-31 DIAGNOSIS — I10 Essential (primary) hypertension: Secondary | ICD-10-CM

## 2015-10-31 DIAGNOSIS — M25551 Pain in right hip: Secondary | ICD-10-CM | POA: Diagnosis not present

## 2015-10-31 DIAGNOSIS — M10062 Idiopathic gout, left knee: Secondary | ICD-10-CM | POA: Diagnosis not present

## 2015-10-31 DIAGNOSIS — N183 Chronic kidney disease, stage 3 (moderate): Secondary | ICD-10-CM

## 2015-10-31 DIAGNOSIS — M109 Gout, unspecified: Secondary | ICD-10-CM

## 2015-10-31 LAB — BASIC METABOLIC PANEL
ANION GAP: 10 (ref 5–15)
BUN: 85 mg/dL — AB (ref 6–20)
CHLORIDE: 108 mmol/L (ref 101–111)
CO2: 25 mmol/L (ref 22–32)
Calcium: 9.1 mg/dL (ref 8.9–10.3)
Creatinine, Ser: 2.22 mg/dL — ABNORMAL HIGH (ref 0.44–1.00)
GFR calc Af Amer: 25 mL/min — ABNORMAL LOW (ref >60)
GFR, EST NON AFRICAN AMERICAN: 21 mL/min — AB (ref >60)
GLUCOSE: 258 mg/dL — AB (ref 65–99)
POTASSIUM: 4.8 mmol/L (ref 3.5–5.1)
Sodium: 143 mmol/L (ref 135–145)

## 2015-10-31 LAB — GLUCOSE, CAPILLARY
GLUCOSE-CAPILLARY: 328 mg/dL — AB (ref 65–99)
Glucose-Capillary: 210 mg/dL — ABNORMAL HIGH (ref 65–99)
Glucose-Capillary: 227 mg/dL — ABNORMAL HIGH (ref 65–99)
Glucose-Capillary: 271 mg/dL — ABNORMAL HIGH (ref 65–99)

## 2015-10-31 LAB — HEMOGLOBIN A1C
Hgb A1c MFr Bld: 8.1 % — ABNORMAL HIGH (ref 4.8–5.6)
Mean Plasma Glucose: 186 mg/dL

## 2015-10-31 MED ORDER — BISACODYL 10 MG RE SUPP
10.0000 mg | Freq: Once | RECTAL | Status: DC
  Filled 2015-10-31: qty 1

## 2015-10-31 MED ORDER — BISACODYL 10 MG RE SUPP
10.0000 mg | Freq: Every day | RECTAL | Status: DC | PRN
Start: 2015-10-31 — End: 2016-09-12

## 2015-10-31 MED ORDER — METHYLPREDNISOLONE ACETATE 40 MG/ML IJ SUSP
INTRAMUSCULAR | Status: AC
  Filled 2015-10-31: qty 1

## 2015-10-31 MED ORDER — LIDOCAINE HCL (PF) 1 % IJ SOLN
INTRAMUSCULAR | Status: AC
  Filled 2015-10-31: qty 30

## 2015-10-31 MED ORDER — OXYCODONE HCL 5 MG PO TABS: 5.0000 mg | ORAL_TABLET | ORAL | Status: DC | PRN

## 2015-10-31 MED ORDER — INSULIN GLARGINE 100 UNIT/ML ~~LOC~~ SOLN
22.0000 [IU] | Freq: Two times a day (BID) | SUBCUTANEOUS | Status: DC
  Administered 2015-10-31: 22 [IU] via SUBCUTANEOUS
  Filled 2015-10-31 (×2): qty 0.22

## 2015-10-31 MED ORDER — HYDRALAZINE HCL 20 MG/ML IJ SOLN
5.0000 mg | Freq: Once | INTRAMUSCULAR | Status: AC
  Administered 2015-10-31: 5 mg via INTRAVENOUS
  Filled 2015-10-31: qty 1

## 2015-10-31 MED ORDER — METHYLPREDNISOLONE ACETATE 80 MG/ML IJ SUSP
INTRAMUSCULAR | Status: AC
  Filled 2015-10-31: qty 1

## 2015-10-31 MED ORDER — DOCUSATE SODIUM 100 MG PO CAPS
100.0000 mg | ORAL_CAPSULE | Freq: Two times a day (BID) | ORAL | Status: DC
Start: 2015-10-31 — End: 2016-04-18

## 2015-10-31 NOTE — Progress Notes (Signed)
Patient given ABN, copy provided to her and original placed in chart.

## 2015-10-31 NOTE — Progress Notes (Addendum)
Pt has been provided SNF bed offers and pt did not want to choose placement at this time.   CSW to follow up tomorrow.  Alison Murray, MSW, LCSW Clinical Social Work Coverage for [employer], [place]

## 2015-10-31 NOTE — Clinical Social Work Note (Signed)
Clinical Social Work Assessment  Patient Details  Name: Norma [name] MRN: [id number] Date of Birth: 1946-01-02  Date of referral:  10/29/15               Reason for consult:  Facility Placement                Permission sought to share information with:    Permission granted to share information::     Name::        Agency::     Relationship::     Contact Information:     Housing/Transportation Living arrangements for the past 2 months:  Single Family Home Source of Information:  Patient Patient Interpreter Needed:  None Criminal Activity/Legal Involvement Pertinent to Current Situation/Hospitalization:  No - Comment as needed Significant Relationships:  Adult Children Lives with:  Self Do you feel safe going back to the place where you live?  Yes Need for family participation in patient care:  No (Coment)  Care giving concerns:  Pt admitted from home alone. PT recommending short-term rehab at a SNF.   Social Worker assessment / plan:  CSW received consult for SNF. CSW weekend coverage completed FL2 and completed [facility] search via [employer].   BSW Intern met with pt at bedside to assess and provide bed offers. Pt confirms she is from home alone and uses a cane at home. BSW Intern discussed PT recommendation for SNF. Pt states she has been to rehab at [facility] before and does not want to go back there. Pt hesitant to short-term rehab at a SNF. Pt is hopeful she can go home with the care of her daughter at home. Pt expressed interest in [facility] due to proximity to her house. Pt to await MD clarification and discuss options with daughter.   BSW Intern to follow-up with pt regarding SNF decision.   Employment status:  Retired Nurse, adult PT Recommendations:  [place] / Referral to community resources:  [place]  Patient/Family's Response to care:  Pt alert and oriented x4. Pt was  overwhelmed at time of visit. Pt states many people have been in and out of her room and asking a lot of questions. Pt wanted to rest.   Patient/Family's Understanding of and Emotional Response to Diagnosis, Current Treatment, and Prognosis:  Pt report no further questions at this time.   Emotional Assessment Appearance:  Appears stated age Attitude/Demeanor/Rapport:  Complaining (Appropriate) Affect (typically observed):  Overwhelmed, Agitated Orientation:  Oriented to Self, Oriented to Place, Oriented to  Time, Oriented to Situation Alcohol / Substance use:  Not Applicable Psych involvement (Current and /or in the community):  No (Comment)  Discharge Needs  Concerns to be addressed:  Care Coordination Readmission within the last 30 days:  No Current discharge risk:  None Barriers to Discharge:  Continued Medical Work up   [employer], Student-SW 10/31/2015, 12:28 PM

## 2015-10-31 NOTE — Discharge Summary (Addendum)
Physician Discharge Summary  Norma [name] [id number] DOB: [birth year] DOA: 10/28/2015  PCP: Bartholome Bill, MD  Admit date: 10/28/2015 Discharge date: 10/31/2015  Time spent: 50 minutes  Recommendations for Outpatient Follow-up:  1. F/u with Dr Marlou Sa in 3-4 wks 2. Bmet in 3-4 days 3. Close f/u of glucose while on oral steroids 4. Follow left knee pain on prednisone- extended if needed  Discharge Condition: stable    Discharge Diagnoses:  Principal Problem:   Right hip pain Active Problems:   Gout of left knee   Hyperlipidemia   Hypertension   CKD (chronic kidney disease) stage 4, GFR 15-29 ml/min (HCC)   DM type 2 causing CKD stage 3 (HCC)   Morbid obesity ([place])   History of present illness:  Norma [name] is a 70 y.o. female with medical history significant of DM, HLD, HTN, DJD, CAD, CKD 3-4 possible a flutter (supposed to be on a monitor now) presents with right hip pain limiting her ability to walk. She has chronic back pain for which she is on Vicodin for years but the hip has been bothering her for 2 wks now.    Hospital Course:  Principal Problem:  Right hip pain - CT reveals severe osteoarthritis- pain is in groin- passively lifting her leg exacerbates the pain - have requested ortho eval- Dr Marlou Sa recommends an MRI of her back - this is negative for any etiology - he notes she has gout in her left knee and may have gout in the hip as well- recommends IR guided injection (which should help with both gout and osteoarthritis) and possible hip replacement later - IR has injected her hip today per Dr Randel Pigg recommendations  Active Problems:  Left knee gout - likely due to number of diuretics mentioned below -Dr Marlou Sa managing- knee aspirated>> gout- given DepoMedrol injection 4/22 - pain has not resolved discussed with Dr Marlou Sa who agrees with giving oral steroids Colchicine and NSAIDs not an option due to CKD- given Solumedrol 80 mg IV- start Prednisone  tomorrow- 40 mg daily for 3 days - she is on Allopurinol as outpt- continue  Constipation - cont colace and laxatives   Hypertension - Clonidine, Lasix, Hydralazine, Coreg, Losartan, Isordil  CAD -Imdur, Coreg, ASA 325 mg, Statin   Diabetes mellitus type 2, insulin dependent ([place]) - at home on Lantus 20 U BID- sugars rising- increased Lantus and SSI - Vitoza resumed   CKD stage 3- 4     Procedures:  Left knee aspiration and injection  Right hip aspiration and injection  Consultations:  ortho  Discharge Exam: Filed Weights   10/28/15 1054  Weight: 99.791 kg (220 lb)   Filed Vitals:   10/31/15 0952 10/31/15 1347  BP: 152/44 127/50  Pulse:  44  Temp:  97.7 F (36.5 C)  Resp:  16    General: AAO x 3, no distress Cardiovascular: RRR, no murmurs  Respiratory: clear to auscultation bilaterally GI: soft, non-tender, non-distended, bowel sound positive Musculoskeletal: left knee quite tender still   Discharge Instructions You were cared for by a hospitalist during your hospital stay. If you have any questions about your discharge medications or the care you received while you were in the hospital after you are discharged, you can call the unit and asked to speak with the hospitalist on call if the hospitalist that took care of you is not available. Once you are discharged, your primary care physician will handle any further medical issues. Please note that  NO REFILLS for any discharge medications will be authorized once you are discharged, as it is imperative that you return to your primary care physician (or establish a relationship with a primary care physician if you do not have one) for your aftercare needs so that they can reassess your need for medications and monitor your lab values.      Discharge Instructions    Discharge instructions    Complete by:  As directed   Bmet in 3-4 days Diabetic low sodium diet     Increase activity slowly    Complete by:  As  directed             Medication List    STOP taking these medications        chlorthalidone 25 MG tablet  Commonly known as:  HYGROTON     HYDROcodone-acetaminophen 7.5-325 MG tablet  Commonly known as:  NORCO      TAKE these medications        acetaminophen 500 MG tablet  Commonly known as:  TYLENOL  Take 500-1,000 mg by mouth every 6 (six) hours as needed for moderate pain.     allopurinol 100 MG tablet  Commonly known as:  ZYLOPRIM  Take 100 mg by mouth daily as needed (gout).     aspirin 325 MG tablet  Take 325 mg by mouth every morning.     atorvastatin 80 MG tablet  Commonly known as:  LIPITOR  Take 1 tablet (80 mg total) by mouth daily.     bisacodyl 10 MG suppository  Commonly known as:  DULCOLAX  Place 1 suppository (10 mg total) rectally daily as needed for moderate constipation.     carvedilol 12.5 MG tablet  Commonly known as:  COREG  Take 1 tablet (12.5 mg total) by mouth 2 (two) times daily.     cloNIDine 0.3 MG tablet  Commonly known as:  CATAPRES  Take 0.3 mg by mouth 2 (two) times daily.     Coenzyme Q10 200 MG capsule  Take 200 mg by mouth every morning.     docusate sodium 100 MG capsule  Commonly known as:  COLACE  Take 1 capsule (100 mg total) by mouth 2 (two) times daily.     ferrous sulfate 325 (65 FE) MG tablet  Take 325 mg by mouth daily with breakfast.     furosemide 80 MG tablet  Commonly known as:  LASIX  Take 80 mg by mouth daily.     hydrALAZINE 50 MG tablet  Commonly known as:  APRESOLINE  Take 50 mg by mouth 3 (three) times daily.     insulin glargine 100 UNIT/ML injection  Commonly known as:  LANTUS  Inject 20 Units into the skin 2 (two) times daily.     isosorbide dinitrate 20 MG tablet  Commonly known as:  ISORDIL  Take 40 mg by mouth daily.     losartan 100 MG tablet  Commonly known as:  COZAAR  Take 100 mg by mouth every morning.     Magnesium 250 MG Tabs  Take 250 mg by mouth every morning.      oxyCODONE 5 MG immediate release tablet  Commonly known as:  Oxy IR/ROXICODONE  Take 1 tablet (5 mg total) by mouth every 4 (four) hours as needed for moderate pain.     PROBIOTIC DAILY PO  Take 1 tablet by mouth every morning.     VICTOZA [place]  Inject 1.8 Units into the skin every  morning.     vitamin B-12 500 MCG tablet  Commonly known as:  CYANOCOBALAMIN  Take 500 mcg by mouth every morning.     Vitamin D3 2000 units Tabs  Take 2,000 Units by mouth every morning.       Allergies  Allergen Reactions  . Iohexol      Code: RASH, Desc: pt called 1 day post scanning stating that skin was red and "itching all over" some what better but still had symptom.. instructed pt to take benadryl to relieve symptoms,per dr Alvester Chou.if any problems call back/mms, Onset Date: [id number]   . Sulfa Antibiotics Other (See Comments)    unknown      The results of significant diagnostics from this hospitalization (including imaging, microbiology, ancillary and laboratory) are listed below for reference.    Significant Diagnostic Studies: Mr Lumbar Spine Wo Contrast  10/29/2015  CLINICAL DATA:  Chronic low back pain with onset of right hip pain 2 weeks ago. Initial encounter. EXAM: MRI LUMBAR SPINE WITHOUT CONTRAST TECHNIQUE: Multiplanar, multisequence MR imaging of the lumbar spine was performed. No intravenous contrast was administered. COMPARISON:  Plain films and CT scan of the right hip 10/28/2015. FINDINGS: Vertebral body height and alignment are maintained. Scattered hemangiomas are seen. There is some degenerative endplate signal change most notable anteriorly at L2-3. No worrisome marrow lesion. The conus medullaris is normal in signal and position. Imaged intra-abdominal contents demonstrate multiple small uterine fibroids. T11-12 and T12-L1 are imaged in the sagittal plane only and negative. L1-2: There is a small right lateral recess disc protrusion with cephalad extension. The disc causes mild  narrowing in the right lateral recess without nerve root compression. The foramina are widely patent. L2-3: Shallow disc bulge with mild ligamentum flavum thickening and facet arthropathy. The central canal and foramina remain open. L3-4: Moderate facet arthropathy with some ligamentum flavum thickening. The central canal and foramina are open. L4-5: Moderate facet arthropathy with ligamentum flavum thickening and a very shallow disc bulge. The central canal and foramina are widely patent. L5-S1: Bilateral facet degenerative change and a minimal disc bulge without central canal or foraminal narrowing. IMPRESSION: No acute abnormality or finding to explain the patient's symptoms. Minimal narrowing the right lateral recess at L1-2 without nerve root compression is noted. There is also multilevel facet arthropathy. Electronically Signed   By: Inge Rise M.D.   On: 10/29/2015 12:15   Ct Hip Right Wo Contrast  10/28/2015  CLINICAL DATA:  Right hip pain.  Suspicious for occult fracture. EXAM: CT OF THE RIGHT HIP WITHOUT CONTRAST TECHNIQUE: Multidetector CT imaging of the right hip was performed according to the standard protocol. Multiplanar CT image reconstructions were also generated. COMPARISON:  None. FINDINGS: No acute fracture or dislocation. No lytic or sclerotic osseous lesion. No periosteal reaction or bone destruction. Severe osteoarthritis of the right hip with severe superior joint space narrowing the bone-on-bone appearance, subchondral reactive marrow changes and marginal osteophytosis. Normal soft tissues. No muscle atrophy. No focal fluid collection or hematoma. Peripheral vascular atherosclerotic disease. Small calcified uterine masses likely representing uterine fibroids. IMPRESSION: 1. No acute osseous injury of the right hip. 2. Severe osteoarthritis of the right hip. Electronically Signed   By: Kathreen Devoid   On: 10/28/2015 15:26   Dg Fluoro Guided Needle Plc Aspiration/injection  Loc  10/31/2015  CLINICAL DATA:  70 year old female with history of osteoarthritis and gout presenting with right-sided hip pain. EXAM: RIGHT HIP INJECTION UNDER FLUOROSCOPY FLUOROSCOPY TIME:  Radiation Exposure Index (  as provided by the fluoroscopic device): 23.63 mGy PROCEDURE: Overlying skin prepped with Betadine, draped in the usual sterile fashion, and infiltrated locally with buffered Lidocaine. 22 gauge spinal needle advanced to the superolateral margin of the right femoral head. 1 ml of Lidocaine injected easily. Diagnostic injection of 3 mL iodinated contrast demonstrates intra-articular spread without intravascular component. 120 mg Depo-Medrol and 5 ml Sensorcaine 0.5% were then administered. No immediate complication. IMPRESSION: Technically successful right hip injection under fluoroscopy. Electronically Signed   By: Vinnie Langton M.D.   On: 10/31/2015 14:06   Dg Hip Unilat With Pelvis 2-3 Views Right  10/28/2015  CLINICAL DATA:  70 year old female with a history of right hip pain. EXAM: DG HIP (WITH OR WITHOUT PELVIS) 2-3V RIGHT COMPARISON:  CT abdomen/ pelvis 05/01/2011 FINDINGS: Bony pelvic ring appears intact.  No acute fracture identified. Bilateral hips projects normally over the acetabula. Advanced changes of osteoarthritis of right greater than left hips with joint space narrowing, bone-on-bone articulation, subchondral sclerosis and cyst formation. Dense vascular calcifications. Surgical changes of the anatomic pelvis. IMPRESSION: Negative for acute bony abnormality. Advanced right greater than left hip osteoarthritis. Advanced atherosclerosis. Signed, Dulcy Fanny. Earleen Newport, DO [facility] Electronically Signed   By: Corrie Mckusick D.O.   On: 10/28/2015 12:58    Microbiology: Recent Results (from the past 240 hour(s))  Body fluid culture     Status: None (Preliminary result)   Collection Time: 10/29/15  3:33 PM  Result Value Ref  Range Status   Specimen Description SYNOVIAL  Final   Special Requests Normal  Final   Gram Stain   Final    WBC PRESENT, PREDOMINANTLY PMN NO ORGANISMS SEEN Gram Stain Report Called to,Read Back By and Verified With: CHEEK,K AT [id number] BY HOOKER,B    Culture   Final    NO GROWTH 2 DAYS Performed at [facility]    Report Status PENDING  Incomplete     Labs: Basic Metabolic Panel:  Recent Labs Lab 10/28/15 1149 10/29/15 0408 10/30/15 0414 10/31/15 0439  NA 142 146* 143 143  K 4.4 4.8 5.1 4.8  CL 108 113* 109 108  CO2 20* 25 25 25   GLUCOSE 272* 142* 198* 258*  BUN 57* 58* 75* 85*  CREATININE 1.67* 2.02* 2.49* 2.22*  CALCIUM 9.3 9.1 8.8* 9.1   Liver Function Tests: No results for input(s): AST, ALT, ALKPHOS, BILITOT, PROT, ALBUMIN in the last 168 hours. No results for input(s): LIPASE, AMYLASE in the last 168 hours. No results for input(s): AMMONIA in the last 168 hours. CBC:  Recent Labs Lab 10/28/15 1149 10/29/15 0408  WBC 8.6 8.1  NEUTROABS 6.9  --   HGB 10.0* 9.6*  HCT 30.5* 29.6*  MCV 87.6 89.2  PLT 251 243   Cardiac Enzymes: No results for input(s): CKTOTAL, CKMB, CKMBINDEX, TROPONINI in the last 168 hours. BNP: BNP (last 3 results) No results for input(s): BNP in the last 8760 hours.  ProBNP (last 3 results) No results for input(s): PROBNP in the last 8760 hours.  CBG:  Recent Labs Lab 10/30/15 1157 10/30/15 1707 10/30/15 2100 10/31/15 0732 10/31/15 1326  GLUCAP 245* 181* 211* 210* 227*       SignedDebbe Odea, MD [facility] 10/31/2015, 3:36 PM

## 2015-10-31 NOTE — Progress Notes (Signed)
PT Cancellation Note  Patient Details Name: Norma [name] MRN: [id number] DOB: Dec 24, 1945   Cancelled Treatment:     [place] Pink Guided [facility] Aspiration/Injection today.  Will attempt to see tomorrow.   Nathanial Rancher 10/31/2015, 6:00 PM

## 2015-10-31 NOTE — Clinical Social Work Placement (Signed)
   CLINICAL SOCIAL WORK PLACEMENT  NOTE  Date:  10/31/2015  Patient Details  Name: Norma [name] MRN: [id number] Date of Birth: October 19, 1945  Clinical Social Work is seeking post-discharge placement for this patient at the [place] level of care (*CSW will initial, date and re-position this form in  chart as items are completed):  Yes   Patient/family provided with [facility] Work Department's list of facilities offering this level of care within the geographic area requested by the patient (or if unable, by the patient's family).  Yes   Patient/family informed of their freedom to choose among providers that offer the needed level of care, that participate in Medicare, Medicaid or managed care program needed by the patient, have an available bed and are willing to accept the patient.  Yes   Patient/family informed of [facility]'s ownership interest in [facility] and [facility], as well as of the fact that they are under no obligation to receive care at these facilities.  PASRR submitted to EDS on 10/30/15     PASRR number received on 10/30/15     Existing PASRR number confirmed on       FL2 transmitted to all facilities in geographic area requested by pt/family on 10/30/15     FL2 transmitted to all facilities within larger geographic area on 10/30/15     Patient informed that his/her managed care company has contracts with or will negotiate with certain facilities, including the following:        Yes   Patient/family informed of bed offers received.  Patient chooses bed at       Physician recommends and patient chooses bed at      Patient to be transferred to   on  .  Patient to be transferred to facility by       Patient family notified on   of transfer.  Name of family member notified:        PHYSICIAN Please sign FL2     Additional Comment:    _______________________________________________ Harlon Flor,  Student-SW 10/31/2015, 12:18 PM

## 2015-11-01 LAB — BASIC METABOLIC PANEL
Anion gap: 12 (ref 5–15)
BUN: 92 mg/dL — ABNORMAL HIGH (ref 6–20)
CHLORIDE: 109 mmol/L (ref 101–111)
CO2: 23 mmol/L (ref 22–32)
CREATININE: 2.02 mg/dL — AB (ref 0.44–1.00)
Calcium: 9 mg/dL (ref 8.9–10.3)
GFR calc non Af Amer: 24 mL/min — ABNORMAL LOW (ref >60)
GFR, EST AFRICAN AMERICAN: 28 mL/min — AB (ref >60)
Glucose, Bld: 139 mg/dL — ABNORMAL HIGH (ref 65–99)
POTASSIUM: 4.8 mmol/L (ref 3.5–5.1)
Sodium: 144 mmol/L (ref 135–145)

## 2015-11-01 LAB — GLUCOSE, CAPILLARY
GLUCOSE-CAPILLARY: 133 mg/dL — AB (ref 65–99)
GLUCOSE-CAPILLARY: 200 mg/dL — AB (ref 65–99)

## 2015-11-01 MED ORDER — HYDRALAZINE HCL 50 MG PO TABS
50.0000 mg | ORAL_TABLET | Freq: Three times a day (TID) | ORAL | Status: DC
  Filled 2015-11-01 (×2): qty 1

## 2015-11-01 MED ORDER — CARVEDILOL 12.5 MG PO TABS: 12.5000 mg | ORAL_TABLET | Freq: Two times a day (BID) | ORAL | Status: DC

## 2015-11-01 MED ORDER — INSULIN GLARGINE 100 UNIT/ML ~~LOC~~ SOLN: 26.0000 [IU] | Freq: Two times a day (BID) | SUBCUTANEOUS | Status: DC

## 2015-11-01 MED ORDER — LABETALOL HCL 5 MG/ML IV SOLN: 20.0000 mg | Freq: Once | INTRAVENOUS | Status: DC

## 2015-11-01 MED ORDER — PREDNISONE 20 MG PO TABS: ORAL_TABLET | ORAL | Status: DC

## 2015-11-01 MED ORDER — METHYLPREDNISOLONE SODIUM SUCC 125 MG IJ SOLR
80.0000 mg | Freq: Once | INTRAMUSCULAR | Status: AC
  Administered 2015-11-01: 80 mg via INTRAVENOUS
  Filled 2015-11-01: qty 1.28

## 2015-11-01 MED ORDER — HYDRALAZINE HCL 50 MG PO TABS: 50.0000 mg | ORAL_TABLET | Freq: Three times a day (TID) | ORAL | Status: DC

## 2015-11-01 MED ORDER — HYDRALAZINE HCL 20 MG/ML IJ SOLN
10.0000 mg | Freq: Once | INTRAMUSCULAR | Status: AC
  Administered 2015-11-01: 10 mg via INTRAVENOUS
  Filled 2015-11-01: qty 1

## 2015-11-01 MED ORDER — INSULIN GLARGINE 100 UNIT/ML ~~LOC~~ SOLN
26.0000 [IU] | Freq: Two times a day (BID) | SUBCUTANEOUS | Status: DC
  Administered 2015-11-01: 26 [IU] via SUBCUTANEOUS
  Filled 2015-11-01 (×2): qty 0.26

## 2015-11-01 NOTE — Progress Notes (Signed)
CSW met with pt and pt's dtr who both are agreeable to d/c to [facility] today. CSW confirmed with Tangie at [facility] they are able to accept pt today. DC paperwork sent via hub and RN to call report. [facility]AR arranged for transport. CSW signing off at d/c.   Wandra Feinstein, MSW, LCSW

## 2015-11-01 NOTE — Progress Notes (Signed)
Patient examined. Stable for discharge. See my updated d/c summary from 4/24.   Debbe Odea, MD

## 2015-11-01 NOTE — Progress Notes (Signed)
I attempted to call report in 3 different times to the receiving facility, [facility], but there was no answer each time. Discharge packet is with the patient and she is in route to the facility.  Joyce Copa, RN

## 2015-11-01 NOTE — Progress Notes (Signed)
Physical Therapy Treatment Patient Details Name: Norma [name] MRN: [id number] DOB: 1945/08/09 Today's Date: 11/01/2015    History of Present Illness 70 y.o. female with h/o DM, HLD, HTN, DJD, CAD, CKD 3-4 possible a flutter  admitted with intractable RLE pain and difficulty walking. X ray showed advanced OA R hip. Pt reports new onset of L knee pain.     PT Comments    Pt not see yesterday due to procedure to L knee and R hip. Assisted OOB to BCS then to recliner only as pt declined to attempt amb due to 9/10 L knee pain and fear of falling.  Performed some B LE TE's then applied ice to L knee.  Pt will need ST Rehab at SNF prior to returning home.   Follow Up Recommendations  SNF     Equipment Recommendations       Recommendations for Other Services       Precautions / Restrictions Precautions Precautions: Fall Restrictions Weight Bearing Restrictions: No    Mobility  Bed Mobility Overal bed mobility: Needs Assistance       Supine to sit: Min assist;Mod assist     General bed mobility comments: min assist to support L LE and mod assist to complete scooting to EOB  Transfers Overall transfer level: Needs assistance   Transfers: Sit to/from Stand Sit to Stand: Mod assist         General transfer comment: pt uses forward rocking momentum to perform sit to stand and required VC's on safe turn completion and hand placement with stand to sit on BSC.  Ambulation/Gait             General Gait Details: Pt declined to attempt immediately stated "I can't walk".    Stairs            Wheelchair Mobility    Modified Rankin (Stroke Patients Only)       Balance                                    Cognition Arousal/Alertness: Awake/alert Behavior During Therapy: WFL for tasks assessed/performed Overall Cognitive Status: Within Functional Limits for tasks assessed                      Exercises  B LE AP 20 reps  B LE knee  presses 20 reps  B LE LAQ's 20 reps  B LE towel squeezes    General Comments        Pertinent Vitals/Pain Pain Assessment: 0-10 Pain Score: 9  Pain Location: 9 on L knee and no pain for R hip during actvity Pain Descriptors / Indicators: Constant Pain Intervention(s): Monitored during session;Premedicated before session;Repositioned;Ice applied    Home Living                      Prior Function            PT Goals (current goals can now be found in the care plan section) Progress towards PT goals: Progressing toward goals    Frequency  Min 3X/week    PT Plan Current plan remains appropriate    Co-evaluation             End of Session Equipment Utilized During Treatment: Gait belt Activity Tolerance: Patient limited by pain Patient left: in chair;with call bell/phone within reach     Time: 1015-1040  PT Time Calculation (min) (ACUTE ONLY): 25 min  Charges:  $Therapeutic Exercise: 8-22 mins $Therapeutic Activity: 8-22 mins                    G Codes:      Rica Koyanagi  PTA [facility]  Acute  Rehab Pager      [phone]

## 2015-11-01 NOTE — Progress Notes (Signed)
CSW met with pt re SNF choice. Pt reports her dtr is to tour [facility] [place] [facility] [place] anticipates making a decision today. Will f/u with pt later today re choice.   Wandra Feinstein, MSW, LCSW [phone] (coverage)

## 2015-11-02 LAB — BODY FLUID CULTURE
CULTURE: NO GROWTH
Special Requests: NORMAL

## 2015-11-24 ENCOUNTER — Ambulatory Visit (INDEPENDENT_AMBULATORY_CARE_PROVIDER_SITE_OTHER): Payer: Medicare Other | Admitting: Cardiology

## 2015-11-24 ENCOUNTER — Encounter: Payer: Self-pay | Admitting: Cardiology

## 2015-11-24 VITALS — BP 150/52 | HR 55 | Ht 62.0 in | Wt 217.0 lb

## 2015-11-24 DIAGNOSIS — I700 Atherosclerosis of aorta: Secondary | ICD-10-CM | POA: Diagnosis not present

## 2015-11-24 DIAGNOSIS — I2510 Atherosclerotic heart disease of native coronary artery without angina pectoris: Secondary | ICD-10-CM

## 2015-11-24 DIAGNOSIS — Z95828 Presence of other vascular implants and grafts: Secondary | ICD-10-CM

## 2015-11-24 DIAGNOSIS — E1122 Type 2 diabetes mellitus with diabetic chronic kidney disease: Secondary | ICD-10-CM

## 2015-11-24 DIAGNOSIS — R609 Edema, unspecified: Secondary | ICD-10-CM

## 2015-11-24 DIAGNOSIS — N184 Chronic kidney disease, stage 4 (severe): Secondary | ICD-10-CM | POA: Diagnosis not present

## 2015-11-24 DIAGNOSIS — N183 Chronic kidney disease, stage 3 unspecified: Secondary | ICD-10-CM

## 2015-11-24 NOTE — Patient Instructions (Signed)
Continue your current therapy  I will see you in 6 months.   

## 2015-11-24 NOTE — Progress Notes (Signed)
Norma [name] Date of Birth: 1945-10-16 Medical Record [id number]  History of Present Illness: Norma [name] is seen for followup CAD and PAD. She has a hsitory of  complex vascular surgery in November of 2012 including aorto-bifemoral bypass for aortic occlusive disease. She had CABG back in June of 2011. She has chronic swelling of her legs from venous insufficiency.  Echo in Feb. 2014  showed a normal EF and moderate PHTN and grade 1 diastolic dysfunction.. She has a history of bradycardia - she remains on a lower dose of her Coreg to 12.5 mg BID.  She has refractory HTN.  She has a history of pulsatile tinnitus.  Carotid dopplers in August 2015 showed no obstructive disease. She underwent CT of the aorta which showed no obstructive disease in the aorta or great vessels. There was no aneurysm. Unfortunately she developed contrast induced nephropathy with increased BUN to 85 and creatinine to 3.57. Her renal function later improved.   On follow up today she was admitted in April with severe hip and knee pain. She was unable to stand. She had cortisone injection of her hip and left knee. She was discharged to [facility]. She may require hip replacement in the future.  She is followed by hematology for chronic anemia. She sees Dr. Florene Glen for her CKD. Previously she complained of palpitations. She was noted to have rare PACs that did correlate with some of her symptoms.   Current Outpatient Prescriptions  Medication Sig Dispense Refill  . acetaminophen (TYLENOL) 500 MG tablet Take 500-1,000 mg by mouth every 6 (six) hours as needed for moderate pain.    Marland Kitchen aspirin 325 MG tablet Take 325 mg by mouth every morning.    Marland Kitchen atorvastatin (LIPITOR) 80 MG tablet Take 1 tablet (80 mg total) by mouth daily. (Patient taking differently: Take 80 mg by mouth every morning. ) 30 tablet 6  . bisacodyl (DULCOLAX) 10 MG suppository Place 1 suppository (10 mg total) rectally daily as needed for moderate constipation. 12  suppository 0  . carvedilol (COREG) 12.5 MG tablet Take 1 tablet (12.5 mg total) by mouth 2 (two) times daily. 180 tablet 3  . Cholecalciferol (VITAMIN D3) 2000 UNITS TABS Take 2,000 Units by mouth every morning.     . cloNIDine (CATAPRES) 0.3 MG tablet Take 0.3 mg by mouth 2 (two) times daily.     . Coenzyme Q10 200 MG capsule Take 200 mg by mouth every morning.     . docusate sodium (COLACE) 100 MG capsule Take 1 capsule (100 mg total) by mouth 2 (two) times daily. 10 capsule 0  . ferrous sulfate 325 (65 FE) MG tablet Take 325 mg by mouth daily with breakfast.    . furosemide (LASIX) 80 MG tablet Take 80 mg by mouth daily.     . hydrALAZINE (APRESOLINE) 50 MG tablet Take 50 mg by mouth 3 (three) times daily.    . insulin glargine (LANTUS) 100 UNIT/ML injection Inject 0.26 mLs (26 Units total) into the skin 2 (two) times daily. (Patient taking differently: Inject 20 Units into the skin 2 (two) times daily. TAKE 20 UNITS IN THE MORNING AND AT NIGHT.) 10 mL 11  . isosorbide dinitrate (ISORDIL) 20 MG tablet Take 40 mg by mouth daily.    . Liraglutide (VICTOZA [place]) Inject 1.8 Units into the skin every morning.     . Magnesium 250 MG TABS Take 250 mg by mouth every morning.     . Probiotic Product (PROBIOTIC DAILY  PO) Take 1 tablet by mouth every morning.     . vitamin B-12 (CYANOCOBALAMIN) 500 MCG tablet Take 500 mcg by mouth every morning.     Marland Kitchen allopurinol (ZYLOPRIM) 100 MG tablet Take 100 mg by mouth daily as needed (gout).     Marland Kitchen losartan (COZAAR) 100 MG tablet Take 100 mg by mouth every morning.      No current facility-administered medications for this visit.    Allergies  Allergen Reactions  . Iohexol      Code: RASH, Desc: pt called 1 day post scanning stating that skin was red and "itching all over" some what better but still had symptom.. instructed pt to take benadryl to relieve symptoms,per dr Alvester Chou.if any problems call back/mms, Onset Date: [id number]   . Sulfa Antibiotics Other (See  Comments)    unknown    Past Medical History  Diagnosis Date  . Claudication ([place])   . Diabetes mellitus   . Hypertension   . Hyperlipidemia   . DJD (degenerative joint disease)   . PAD (peripheral artery disease) ([place])   . Coronary artery disease   . Leg pain   . Carotid artery occlusion   . Obesity   . DDD (degenerative disc disease)   . DJD (degenerative joint disease)   . Shortness of breath     exertion  . Anxiety   . Diabetic coma ([place])   . Edema   . CKD (chronic kidney disease) stage 3, GFR 30-59 ml/min 04/18/2014    Past Surgical History  Procedure Laterality Date  . Cardiac catheterization  12/01/2009    EF 65%  . Removal of fibrous cyst from right breast  10+ YEARS  . Retinal detachment surgery  10+ YEARS    LEFT EYE  . Coronary artery bypass graft  12/08/2009    LIMA GRAFT TO THE DISTAL LAD AND SAPHENOUS VEIN GRAFT TO THE OBTUSE MARGINAL VESSEL  . Transthoracic echocardiogram  12/01/2009    EF 60-65%  . Cardiovascular stress test  11/28/2009    EF 75%  . Aorta - bilateral femoral artery bypass graft  05/25/2011    Procedure: AORTA BIFEMORAL BYPASS GRAFT;  Surgeon: Mal Misty, MD;  Location: [facility] OR;  Service: Vascular;  Laterality: N/A;  . Pr vein bypass graft,aorto-fem-pop  05/25/11    History  Smoking status  . Former Smoker -- 20 years  . Types: Cigarettes  . Quit date: 07/10/1991  Smokeless tobacco  . Never Used    History  Alcohol Use No    Family History  Problem Relation Age of Onset  . Hypertension Mother   . Coronary artery disease Father   . Hypertension Sister   . Cirrhosis Brother   . Kidney disease Daughter     Review of Systems: The review of systems is per the HPI.   All other systems were reviewed and are negative.  Physical Exam: BP 150/52 mmHg  Pulse 55  Ht 5\' 2"  (1.575 m)  Wt 98.431 kg (217 lb)  BMI 39.68 kg/m2 Patient is very pleasant and in no acute distress. She is in a wheelchair. She is obese. Skin is warm and  dry. Color is normal.  HEENT is unremarkable. Normocephalic/atraumatic. PERRL. Sclera are nonicteric. Neck is supple. No masses. No JVD. There are bilateral loud bruits R>L at the base of the neck and subclavian areas. Lungs are clear. Cardiac exam shows a regular rate and rhythm. Normal S1-2, no gallop or cardiac murmur. Abdomen is soft. NT,  BS +. No masses. Extremities are without  edema. Gait and ROM are intact. No gross neurologic deficits noted.  LABORATORY DATA:   Lab Results  Component Value Date   WBC 8.1 10/29/2015   HGB 9.6* 10/29/2015   HCT 29.6* 10/29/2015   PLT 243 10/29/2015   GLUCOSE 139* 11/01/2015   CHOL 138 04/30/2014   TRIG 169* 04/30/2014   HDL 29* 04/30/2014   LDLCALC 75 04/30/2014   ALT 15 10/19/2015   AST 21 10/19/2015   NA 144 11/01/2015   K 4.8 11/01/2015   CL 109 11/01/2015   CREATININE 2.02* 11/01/2015   BUN 92* 11/01/2015   CO2 23 11/01/2015   TSH 2.193 04/16/2014   INR 1.09 01/18/2015   HGBA1C 8.1* 10/29/2015    Assessment / Plan: 1. HTN - Blood pressure has been difficult to control but looks fairly good today on multiple medications. Continue current therapy.   I have made no changes today.   2. CAD - with past CABG - asymptomatic.   3. PVD - with past aortobifem bypass in 2012 - by Dr. Kellie Simmering  4. Palpitations. Event monitor was unremarkable except for benign PACs.   5. Severe aortic atherosclerosis. No obstructive disease or aneurysm. Her pulsatile tinnitus is related to radiated bruits from her vascular disease.   6. Anemia. Chronic. Partly related to iron deficiency and CKD.  7. 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 Avoid contrast studies in the future unless absolutely necessary.  8. Severe osteoarthritis of right hip. Follow up with Dr. Marlou Sa.   I will follow up in 6 months.

## 2015-12-02 ENCOUNTER — Telehealth: Payer: Self-pay | Admitting: Cardiology

## 2015-12-02 NOTE — Telephone Encounter (Signed)
Error// Pt had a question about recall appointment.

## 2015-12-16 ENCOUNTER — Other Ambulatory Visit: Payer: Self-pay | Admitting: Orthopedic Surgery

## 2016-01-12 ENCOUNTER — Other Ambulatory Visit (HOSPITAL_COMMUNITY): Payer: Self-pay

## 2016-01-12 ENCOUNTER — Encounter (HOSPITAL_COMMUNITY)
Admission: RE | Admit: 2016-01-12 | Discharge: 2016-01-12 | Disposition: A | Discharge status: Home / Self Care | Payer: Medicare Other | Source: Ambulatory Visit | Attending: Orthopedic Surgery | Admitting: Orthopedic Surgery

## 2016-01-12 DIAGNOSIS — M1611 Unilateral primary osteoarthritis, right hip: Secondary | ICD-10-CM | POA: Diagnosis not present

## 2016-01-12 DIAGNOSIS — Z01812 Encounter for preprocedural laboratory examination: Secondary | ICD-10-CM | POA: Diagnosis not present

## 2016-01-12 DIAGNOSIS — Z0183 Encounter for blood typing: Secondary | ICD-10-CM | POA: Diagnosis not present

## 2016-01-12 LAB — URINE MICROSCOPIC-ADD ON

## 2016-01-12 LAB — BASIC METABOLIC PANEL
ANION GAP: 7 (ref 5–15)
BUN: 39 mg/dL — AB (ref 6–20)
CHLORIDE: 107 mmol/L (ref 101–111)
CO2: 26 mmol/L (ref 22–32)
Calcium: 9 mg/dL (ref 8.9–10.3)
Creatinine, Ser: 1.7 mg/dL — ABNORMAL HIGH (ref 0.44–1.00)
GFR calc Af Amer: 34 mL/min — ABNORMAL LOW (ref >60)
GFR calc non Af Amer: 29 mL/min — ABNORMAL LOW (ref >60)
GLUCOSE: 216 mg/dL — AB (ref 65–99)
POTASSIUM: 4.4 mmol/L (ref 3.5–5.1)
Sodium: 140 mmol/L (ref 135–145)

## 2016-01-12 LAB — TYPE AND SCREEN
ABO/RH(D): A POS
Antibody Screen: NEGATIVE

## 2016-01-12 LAB — CBC
HEMATOCRIT: 30.3 % — AB (ref 36.0–46.0)
HEMOGLOBIN: 9.4 g/dL — AB (ref 12.0–15.0)
MCH: 28.5 pg (ref 26.0–34.0)
MCHC: 31 g/dL (ref 30.0–36.0)
MCV: 91.8 fL (ref 78.0–100.0)
Platelets: 372 10*3/uL (ref 150–400)
RBC: 3.3 MIL/uL — ABNORMAL LOW (ref 3.87–5.11)
RDW: 16.8 % — AB (ref 11.5–15.5)
WBC: 6.8 10*3/uL (ref 4.0–10.5)

## 2016-01-12 LAB — SURGICAL PCR SCREEN
MRSA, PCR: NEGATIVE
Staphylococcus aureus: NEGATIVE

## 2016-01-12 LAB — URINALYSIS, ROUTINE W REFLEX MICROSCOPIC
BILIRUBIN URINE: NEGATIVE
Glucose, UA: NEGATIVE mg/dL
Hgb urine dipstick: NEGATIVE
KETONES UR: NEGATIVE mg/dL
NITRITE: NEGATIVE
PH: 7 (ref 5.0–8.0)
Protein, ur: 30 mg/dL — AB
SPECIFIC GRAVITY, URINE: 1.015 (ref 1.005–1.030)

## 2016-01-12 LAB — GLUCOSE, CAPILLARY: Glucose-Capillary: 239 mg/dL — ABNORMAL HIGH (ref 65–99)

## 2016-01-12 MED ORDER — CHLORHEXIDINE GLUCONATE 4 % EX LIQD: 60.0000 mL | Freq: Once | CUTANEOUS | Status: DC

## 2016-01-12 NOTE — Pre-Procedure Instructions (Signed)
Norma [name]  01/12/2016      [id number] [name], [place] [address] Phone: [phone] Fax: [phone]  [name], [name] [name] Phone: [phone] Fax: [phone]    Your procedure is scheduled on January 24, 2016.  Report to [facility] Admitting at 5:30 A.M.  Call this number if you have problems the morning of surgery:  [phone]   Remember:  Do not eat food or drink liquids after midnight.  Take these medicines the morning of surgery with A SIP OF WATER : carvedilol (COREG), cloNIDine (CATAPRES), hydrALAZINE (APRESOLINE) , isosorbide dinitrate  (ISORDIL)  HYDROcodone-acetaminophen (NORCO)- if needed   STOP ASPIRIN, HERBAL MEDICATIONS ONE WEEK PRIOR TO SURGERY 7/11    How to Manage Your Diabetes Before and After Surgery  Why is it important to control my blood sugar before and after surgery? . Improving blood sugar levels before and after surgery helps healing and can limit problems. . A way of improving blood sugar control is eating a healthy diet by: o  Eating less sugar and carbohydrates o  Increasing activity/exercise o  Talking with your doctor about reaching your blood sugar goals . High blood sugars (greater than 180 mg/dL) can raise your risk of infections and slow your recovery, so you will need to focus on controlling your diabetes during the weeks before surgery. . Make sure that the doctor who takes care of your diabetes knows about your planned surgery including the date and location.  How do I manage my blood sugar before surgery? . Check your blood sugar at least 4 times a day, starting 2 days before surgery, to make sure that the level is not too high or low. o Check your blood sugar the morning of your surgery when you wake up and every 2 hours until  you get to the Short Stay unit. . If your blood sugar is less than 70 mg/dL, you will need to treat for low blood sugar: o Do not take insulin. o Treat a low blood sugar (less than 70 mg/dL) with  cup of clear juice (cranberry or apple), 4 glucose tablets, OR glucose gel. o Recheck blood sugar in 15 minutes after treatment (to make sure it is greater than 70 mg/dL). If your blood sugar is not greater than 70 mg/dL on recheck, call [phone] for further instructions. . Report your blood sugar to the short stay nurse when you get to Short Stay.  . If you are admitted to the hospital after surgery: o Your blood sugar will be checked by the staff and you will probably be given insulin after surgery (instead of oral diabetes medicines) to make sure you have good blood sugar levels. o The goal for blood sugar control after surgery is 80-180 mg/dL.   WHAT DO I DO ABOUT MY DIABETES MEDICATION?   Marland Kitchen Do not take oral diabetes medicines (pills) the morning of surgery.  . THE NIGHT BEFORE SURGERY, take ___10____ units of ____LANTUS__insulin.       Marland Kitchen HE MORNING OF SURGERY, take ___10____ units of ___LANTUS___insulin.  . The day of surgery, do not take other diabetes injectables, including Byetta (exenatide), Bydureon (exenatide ER), Victoza (liraglutide), or Trulicity (dulaglutide).   Reviewed and Endorsed by [facility] Patient Education Committee, August 2015   Do not wear jewelry,  make-up or nail polish.  Do not wear lotions, powders, or perfumes.  You may wear deoderant.  Do not shave 48 hours prior to surgery.    Do not bring valuables to the hospital.  [name] is not responsible for any belongings or valuables.  Contacts, dentures or bridgework may not be worn into surgery.  Leave your suitcase in the car.  After surgery it may be brought to your room.  For patients admitted to the hospital, discharge time will be determined by your treatment team.  Patients discharged the day of  surgery will not be allowed to drive home.   Name and phone number of your driver:    Special instructions:  "Preparing for surgery"  Please read over the following fact sheets that you were given. Pain Booklet, Coughing and Deep Breathing, Total Joint Packet and Surgical Site Infection Prevention

## 2016-01-13 LAB — URINE CULTURE

## 2016-01-17 NOTE — Progress Notes (Addendum)
Anesthesia Chart Review:  Pt is a 70 year old female scheduled for R total hip arthroplasty anterior approach on 01/24/2016 with Meredith Pel, MD  PCP is Precious Haws, MD (care everywhere) who is aware of upcoming surgery. Cardiologist is Peter Martinique, MD. Hematologist is Lorna Few, MD. Nephrologist is Erling Cruz, MD.   PMH includes:  CAD (s/p CABG 2011), HTN, DM, hyperlipidemia, CKD (stage 3), PAD (s/p aorto bifem BG 05/25/11). Former smoker. BMI 37.5  Medications include: ASA, lipitor, carvedilol, clonidine, iron, lasix, hydralazine, lantus, isordil, liraglutide, losartan  Preoperative labs reviewed.   - Cr 1.7, BUN 39. This appears to be consistent with pt's baseline.  - H/H 9.4/30.3. This is consistent with prior results. Pt has anemia followed by Dr. Earlie Server.  - Glucose 216. HgbA1c was 9.0 on 01/11/16  CXR from PCP's office dated 2015. Will obtain DOS.   EKG 10/11/15: sinus bradycardia (47 bpm). ST and T wave abnormality, consider inferolateral ischemia.   Cardiac monitor 10/13/15:   Normal sinus rhythm/ sinus bradycardia.  Rare PACs  No other arrhythmia  symptoms of fluttering/skipped beats correlates with PACs  symptoms of rapid heart beat/racing do not correlate with any arrhythmia.  Carotid duplex 03/01/14: B proximal ICAs demonstrate mild amount of fibrous plaque with no evidence of hemodynamically significant stenosis.   Echo 09/04/12:  - Left ventricle: The cavity size was normal. Wall thickness was increased in a pattern of severe LVH. Systolicfunction was normal. The estimated ejection fraction wasin the range of 60% to 65%. Wall motion was normal; there were no regional wall motion abnormalities. Dopplerparameters are consistent with abnormal left ventricularrelaxation (grade 1 diastolic dysfunction). Dopplerparameters are consistent with high ventricular filling pressure. - Mitral valve: Calcified annulus. - Left atrium: The atrium was moderately  dilated. - Pulmonary arteries: Systolic pressure was moderatelyincreased. PA peak pressure: 81mm Hg (S). - Pericardium, extracardiac: A trivial pericardial effusion was identified.  I notified Kim in Dr. Randel Pigg office of abnormal labs.   If no changes, I anticipate pt can proceed with surgery as scheduled.   Willeen Cass, FNP-BC [facility] Short Stay Surgical Center/Anesthesiology Phone: [phone] 01/18/2016 2:01 PM

## 2016-01-23 ENCOUNTER — Other Ambulatory Visit: Payer: Self-pay | Admitting: Orthopedic Surgery

## 2016-02-24 NOTE — Pre-Procedure Instructions (Signed)
KENLYNN DELGARDO  02/24/2016     Your procedure is scheduled on : Tuesday March 06, 2016 at 7:30 AM.  Report to [facility] Admitting at 5:30 AM.  Call this number if you have problems the morning of surgery: [phone]    Remember:  Do not eat food or drink liquids after midnight.  Take these medicines the morning of surgery with A SIP OF WATER : Acetaminophen (Tylenol) if needed, Carvedilol (Coreg), Clonidine (Catapres), Hydralazine (Apresoline), Hydrocodone if needed, Isosorbide (Imdur)   Stop taking any vitamins, herbal medications/supplements, NSAIDs, Ibuprofen, Advil, Motrin, Aleve, Co-Q10, etc on Tuesday August 22nd.       How to Manage Your Diabetes Before and After Surgery  Why is it important to control my blood sugar before and after surgery? . Improving blood sugar levels before and after surgery helps healing and can limit problems. . A way of improving blood sugar control is eating a healthy diet by: o  Eating less sugar and carbohydrates o  Increasing activity/exercise o  Talking with your doctor about reaching your blood sugar goals . High blood sugars (greater than 180 mg/dL) can raise your risk of infections and slow your recovery, so you will need to focus on controlling your diabetes during the weeks before surgery. . Make sure that the doctor who takes care of your diabetes knows about your planned surgery including the date and location.  How do I manage my blood sugar before surgery? . Check your blood sugar at least 4 times a day, starting 2 days before surgery, to make sure that the level is not too high or low. o Check your blood sugar the morning of your surgery when you wake up and every 2 hours until you get to the Short Stay unit. . If your blood sugar is less than 70 mg/dL, you will need to treat for low blood sugar: o Do not take insulin. o Treat a low blood sugar (less than 70 mg/dL) with  cup of clear juice (cranberry or apple), 4  glucose tablets, OR glucose gel. o Recheck blood sugar in 15 minutes after treatment (to make sure it is greater than 70 mg/dL). If your blood sugar is not greater than 70 mg/dL on recheck, call [phone] for further instructions. . Report your blood sugar to the short stay nurse when you get to Short Stay.  . If you are admitted to the hospital after surgery: o Your blood sugar will be checked by the staff and you will probably be given insulin after surgery (instead of oral diabetes medicines) to make sure you have good blood sugar levels. o The goal for blood sugar control after surgery is 80-180 mg/dL.     WHAT DO I DO ABOUT MY DIABETES MEDICATION?   Marland Kitchen Do not take oral diabetes medicines (pills) the morning of surgery.  . THE NIGHT BEFORE SURGERY, take 10 units of Lantus insulin.     . THE MORNING OF SURGERY, take 10 units of Lantus insulin.  . The day of surgery, Do NOT take other diabetes injectables, including Victoza (liraglutide)  Reviewed and Endorsed by [facility] Patient Education Committee, August 2015   Do not wear jewelry, make-up or nail polish.  Do not wear lotions, powders, or perfumes.    Do not shave 48 hours prior to surgery.    Do not bring valuables to the hospital.  [facility] is not responsible for any belongings or valuables.  Contacts, dentures or bridgework  may not be worn into surgery.  Leave your suitcase in the car.  After surgery it may be brought to your room.  For patients admitted to the hospital, discharge time will be determined by your treatment team.  Patients discharged the day of surgery will not be allowed to drive home.   Name and phone number of your driver:    Special instructions:  Shower using CHG soap the night before and the morning of your surgery  Please read over the following fact sheets that you were given. Pain Booklet, MRSA Information and Surgical Site Infection Prevention

## 2016-02-27 ENCOUNTER — Encounter (HOSPITAL_COMMUNITY)
Admission: RE | Admit: 2016-02-27 | Discharge: 2016-02-27 | Disposition: A | Discharge status: Home / Self Care | Payer: Medicare Other | Source: Ambulatory Visit | Attending: Orthopedic Surgery | Admitting: Orthopedic Surgery

## 2016-02-27 ENCOUNTER — Ambulatory Visit (HOSPITAL_COMMUNITY)
Admission: RE | Admit: 2016-02-27 | Discharge: 2016-02-27 | Disposition: A | Discharge status: Home / Self Care | Payer: Medicare Other | Source: Ambulatory Visit | Attending: Orthopedic Surgery | Admitting: Orthopedic Surgery

## 2016-02-27 ENCOUNTER — Encounter (HOSPITAL_COMMUNITY): Payer: Self-pay

## 2016-02-27 DIAGNOSIS — I700 Atherosclerosis of aorta: Secondary | ICD-10-CM | POA: Diagnosis not present

## 2016-02-27 DIAGNOSIS — I517 Cardiomegaly: Secondary | ICD-10-CM | POA: Diagnosis not present

## 2016-02-27 DIAGNOSIS — Z01818 Encounter for other preprocedural examination: Secondary | ICD-10-CM | POA: Insufficient documentation

## 2016-02-27 HISTORY — DX: Gout, unspecified: M10.9

## 2016-02-27 HISTORY — DX: Constipation, unspecified: K59.00

## 2016-02-27 HISTORY — DX: Iron deficiency anemia, unspecified: D50.9

## 2016-02-27 LAB — BASIC METABOLIC PANEL
Anion gap: 6 (ref 5–15)
BUN: 45 mg/dL — ABNORMAL HIGH (ref 6–20)
CHLORIDE: 114 mmol/L — AB (ref 101–111)
CO2: 23 mmol/L (ref 22–32)
CREATININE: 1.58 mg/dL — AB (ref 0.44–1.00)
Calcium: 8.6 mg/dL — ABNORMAL LOW (ref 8.9–10.3)
GFR, EST AFRICAN AMERICAN: 37 mL/min — AB (ref >60)
GFR, EST NON AFRICAN AMERICAN: 32 mL/min — AB (ref >60)
Glucose, Bld: 100 mg/dL — ABNORMAL HIGH (ref 65–99)
POTASSIUM: 4.3 mmol/L (ref 3.5–5.1)
SODIUM: 143 mmol/L (ref 135–145)

## 2016-02-27 LAB — CBC
HCT: 28.7 % — ABNORMAL LOW (ref 36.0–46.0)
Hemoglobin: 8.7 g/dL — ABNORMAL LOW (ref 12.0–15.0)
MCH: 28.2 pg (ref 26.0–34.0)
MCHC: 30.3 g/dL (ref 30.0–36.0)
MCV: 93.2 fL (ref 78.0–100.0)
PLATELETS: 252 10*3/uL (ref 150–400)
RBC: 3.08 MIL/uL — AB (ref 3.87–5.11)
RDW: 16.6 % — ABNORMAL HIGH (ref 11.5–15.5)
WBC: 6.1 10*3/uL (ref 4.0–10.5)

## 2016-02-27 LAB — URINALYSIS, ROUTINE W REFLEX MICROSCOPIC
BILIRUBIN URINE: NEGATIVE
GLUCOSE, UA: NEGATIVE mg/dL
HGB URINE DIPSTICK: NEGATIVE
KETONES UR: NEGATIVE mg/dL
Nitrite: NEGATIVE
PH: 5.5 (ref 5.0–8.0)
PROTEIN: 100 mg/dL — AB
Specific Gravity, Urine: 1.018 (ref 1.005–1.030)

## 2016-02-27 LAB — URINE MICROSCOPIC-ADD ON: RBC / HPF: NONE SEEN RBC/hpf (ref 0–5)

## 2016-02-27 LAB — GLUCOSE, CAPILLARY: GLUCOSE-CAPILLARY: 102 mg/dL — AB (ref 65–99)

## 2016-02-27 LAB — SURGICAL PCR SCREEN
MRSA, PCR: NEGATIVE
Staphylococcus aureus: NEGATIVE

## 2016-02-27 NOTE — Progress Notes (Signed)
PCP is  Tammy Luciana Axe  Cardiologist is Peter Martinique  Patient denied having any acute cardiac or pulmonary issues at this time.   Patient informed Nurse that her surgery was canceled last time because her A1C was 9. CBG on arrival to PAT was 102, and patient stated she consumed eggs, [place] bacon, a slice of tomato, and drank a diet Dr. Malachi Bonds for breakfast. Blood glucose typically ranges from 92-172.   Will send chart to anesthesia for review.

## 2016-02-28 LAB — URINE CULTURE

## 2016-02-28 LAB — HEMOGLOBIN A1C
Hgb A1c MFr Bld: 6.9 % — ABNORMAL HIGH (ref 4.8–5.6)
MEAN PLASMA GLUCOSE: 151 mg/dL

## 2016-02-28 NOTE — Progress Notes (Addendum)
Anesthesia Chart Review:  Pt is a 70 year old female scheduled for R total hip arthroplasty anterior approach on 03/06/2016 with Norma Pel, MD  Surgery was originally scheduled for 01/24/16 but was cancelled for poorly controlled DM.   - PCP is Norma Haws, MD (care everywhere) who is aware of upcoming surgery.  - Cardiologist is Norma Martinique, MD.  - Hematologist is Norma Few, MD.  - Nephrologist is Norma Cruz, MD.   PMH includes:  CAD (s/p CABG 2011), HTN, DM, hyperlipidemia, CKD (stage 3), PAD (s/p aorto bifem BG 05/25/11) iron deficiency anemia. Former smoker. BMI 38  Medications include: ASA, lipitor, carvedilol, clonidine, iron, lasix, hydralazine, lantus, isordil, liraglutide, losartan  Preoperative labs reviewed.   - Cr 1.58, BUN 45. This appears to be consistent with pt's baseline.  - H/H 8.7/28.7. This appears lower than prior results (range 9.3-10.1). Pt has anemia followed by Dr. Earlie [name]. Left message for Norma [name] in Dr. Randel Pigg office about low hgb.  - Glucose 100, hgbA1c was 6.9  chest X-ray 02/27/16: No active cardiopulmonary disease. Stable mild cardiomegaly. Aortic atherosclerosis.  EKG 10/11/15: sinus bradycardia (47 bpm). ST and T wave abnormality, consider inferolateral ischemia. Appears stable dating back to 05/22/14 EKG.   Cardiac monitor 10/13/15:   Normal sinus rhythm/ sinus bradycardia.  Rare PACs  No other arrhythmia  symptoms of fluttering/skipped beats correlates with PACs  symptoms of rapid heart beat/racing do not correlate with any arrhythmia.  Carotid duplex 03/01/14: B proximal ICAs demonstrate mild amount of fibrous plaque with no evidence of hemodynamically significant stenosis.   Echo 09/04/12:  - Left ventricle: The cavity size was normal. Wall thickness was increased in a pattern of severe LVH. Systolicfunction was normal. The estimated ejection fraction wasin the range of 60% to 65%. Wall motion was normal; there were no  regional wall motion abnormalities. Dopplerparameters are consistent with abnormal left ventricularrelaxation (grade 1 diastolic dysfunction). Dopplerparameters are consistent with high ventricular filling pressure. - Mitral valve: Calcified annulus. - Left atrium: The atrium was moderately dilated. - Pulmonary arteries: Systolic pressure was moderatelyincreased. PA peak pressure: 43mm Hg (S). - Pericardium, extracardiac: A trivial pericardial effusion was identified.  Norma Cass, FNP-BC [facility] Short Stay Surgical Center/Anesthesiology Phone: [phone] 02/28/2016 4:47 PM  Addendum:   Telephone encounter by Norma Magic, RN dated 03/01/16 indicates Dr. Earlie [name] was notified about low hgb and noted pt may need blood transfusion after surgery.

## 2016-02-29 ENCOUNTER — Telehealth: Payer: Self-pay | Admitting: *Deleted

## 2016-02-29 NOTE — Telephone Encounter (Signed)
"  Abigail Butts RN with Dr. Alphonzo Severance calling to have patient seen by Dr.Mohamed to make sure everything's okay before total joint surgery on 03-06-2016.  Short Stay called to notify us her HGB = 8.7.  No report of s.o.b or chest pain.  Return number [phone]."

## 2016-03-01 NOTE — Telephone Encounter (Signed)
cern reviewed with MD, call to Dr. Nicki Reaper Dean's office, spoke with Marcie Bal who advised she will give MD message as follows. Per MD, pt may need blood transfusion after surgery.

## 2016-03-05 ENCOUNTER — Other Ambulatory Visit: Payer: Self-pay | Admitting: Orthopedic Surgery

## 2016-03-05 LAB — PREPARE RBC (CROSSMATCH)

## 2016-03-05 MED ORDER — CEFAZOLIN SODIUM-DEXTROSE 2-4 GM/100ML-% IV SOLN
2.0000 g | INTRAVENOUS | Status: AC
  Administered 2016-03-06: 2 g via INTRAVENOUS

## 2016-03-05 NOTE — H&P (Signed)
TOTAL HIP ADMISSION H&P  Patient is admitted for right total hip arthroplasty.  Subjective:  Chief Complaint: right hip pain  HPI: Norma [name], 70 y.o. female, has a history of pain and functional disability in the right hip(s) due to arthritis and patient has failed non-surgical conservative treatments for greater than 12 weeks to include NSAID's and/or analgesics, corticosteriod injections, use of assistive devices and activity modification.  Onset of symptoms was gradual starting 8 years ago with gradually worsening course since that time.The patient noted no past surgery on the right hip(s).  Patient currently rates pain in the right hip at 9 out of 10 with activity. Patient has night pain, worsening of pain with activity and weight bearing, trendelenberg gait, pain that interfers with activities of daily living, pain with passive range of motion and crepitus. Patient has evidence of subchondral cysts, subchondral sclerosis and joint space narrowing by imaging studies. This condition presents safety issues increasing the risk of falls. This patient has had An very difficult time maintaining her quality of life because of this hip pain on the right-hand side.  There is no current active infection.  Patient Active Problem List   Diagnosis Date Noted  . Gout of left knee 10/31/2015  . Right hip pain 10/29/2015  . DM type 2 causing CKD stage 3 ([place]) 10/29/2015  . Morbid obesity ([place]) 10/29/2015  . CKD (chronic kidney disease) stage 4, GFR 15-29 ml/min (H[facility]) 04/18/2014  . Aortic atherosclerosis ([place]) 04/18/2014  . Bilateral carotid bruits 04/16/2014  . Pulsatile tinnitus of right ear 04/16/2014  . Diarrhea 09/18/2011  . Peripheral vascular disease, unspecified ([place]) 08/14/2011  . Claudication ([place]) 08/14/2011  . Edema 06/29/2011  . Leucocytosis 06/12/2011  . Acute on chronic kidney disease, stage 4 05/28/2011  . Respiratory failure, acute ([place]) 05/25/2011    Class: Acute  . S/P  aorto-bifemoral bypass surgery 05/25/2011    Class: Acute  . PAD (peripheral artery disease) ([place])   . Coronary artery disease   . Hyperlipidemia   . Hypertension    Past Medical History:  Diagnosis Date  . Anxiety   . Carotid artery occlusion   . CKD (chronic kidney disease) stage 3, GFR 30-59 ml/min 04/18/2014  . Claudication ([place])   . Constipation    Due to patient taking iron supplements  . Coronary artery disease   . DDD (degenerative disc disease)   . Diabetes mellitus   . Diabetic coma ([place])   . DJD (degenerative joint disease)   . DJD (degenerative joint disease)   . Edema    Bilateral lower extermities  . Gout   . Hyperlipidemia   . Hypertension   . Iron deficiency anemia   . Leg pain   . Obesity   . PAD (peripheral artery disease) ([place])   . Shortness of breath    exertion    Past Surgical History:  Procedure Laterality Date  . AORTA - BILATERAL FEMORAL ARTERY BYPASS GRAFT  05/25/2011   Procedure: AORTA BIFEMORAL BYPASS GRAFT;  Surgeon: Mal Misty, MD;  Location: [facility];  Service: Vascular;  Laterality: N/A;  . CARDIAC CATHETERIZATION  12/01/2009   EF 65%  . CARDIOVASCULAR STRESS TEST  11/28/2009   EF 75%  . COLONOSCOPY W/ POLYPECTOMY    . CORONARY ARTERY BYPASS GRAFT  12/08/2009   LIMA GRAFT TO THE DISTAL LAD AND SAPHENOUS VEIN GRAFT TO THE OBTUSE MARGINAL VESSEL  . PR VEIN BYPASS GRAFT,AORTO-FEM-POP  05/25/11  . REMOVAL OF FIBROUS CYST  FROM RIGHT BREAST  10+ YEARS  . RETINAL DETACHMENT SURGERY  10+ YEARS   LEFT EYE  . TRANSTHORACIC ECHOCARDIOGRAM  12/01/2009   EF 60-65%    No prescriptions prior to admission.   Allergies  Allergen Reactions  . Iohexol Itching and Rash     Code: RASH, Desc: pt called 1 day post scanning stating that skin was red and "itching all over" some what better but still had symptom.. instructed pt to take benadryl to relieve symptoms,per dr Alvester Chou.if any problems call back/mms, Onset Date: [id number]   . Sulfa Antibiotics Other  (See Comments)    unknown    Social History  Substance Use Topics  . Smoking status: Former Smoker    Years: 20.00    Types: Cigarettes    Quit date: 07/10/1991  . Smokeless tobacco: Never Used  . Alcohol use No    Family History  Problem Relation Age of Onset  . Hypertension Mother   . Coronary artery disease Father   . Hypertension Sister   . Cirrhosis Brother   . Kidney disease Daughter      Review of Systems  Constitutional: Negative.   HENT: Negative.   Eyes: Negative.   Cardiovascular: Negative.   Gastrointestinal: Negative.   Genitourinary: Negative.   Musculoskeletal: Positive for joint pain.  Skin: Negative.   Neurological: Negative.   Endo/Heme/Allergies: Negative.   Psychiatric/Behavioral: Negative.     Objective:  Physical Exam  Constitutional: She appears well-developed.  HENT:  Head: Normocephalic.  Eyes: Pupils are equal, round, and reactive to light.  Neck: Normal range of motion.  Cardiovascular: Normal rate.   Respiratory: Effort normal.  Neurological: She is alert.  Skin: Skin is warm.  Psychiatric: She has a normal mood and affect.  Examination the right hip demonstrates pain with range of motion particularly internal rotation approximately equal leg lengths palpable pedal pulse reasonable hip flexion and extension strength.  No trochanteric tenderness is present.  At the time of her last clinic visit the patient did have a small pannus under which the skin looked reasonable and out of the anticipated field of surgery  Vital signs in last 24 hours:    Labs:   Estimated body mass index is 38.1 kg/m as calculated from the following:   Height as of 02/27/16: 5\' 2"  (1.575 m).   Weight as of 02/27/16: 94.5 kg (208 lb 5 oz).   Imaging Review Plain radiographs demonstrate moderate degenerative joint disease of the right hip(s). The bone quality appears to be fair for age and reported activity level.  Assessment/Plan:  End stage arthritis,  right hip(s)  The patient history, physical examination, clinical judgement of the provider and imaging studies are consistent with end stage degenerative joint disease of the right hip(s) and total hip arthroplasty is deemed medically necessary. The treatment options including medical management, injection therapy, arthroscopy and arthroplasty were discussed at length. The risks and benefits of total hip arthroplasty were presented and reviewed. The risks due to aseptic loosening, infection, stiffness, dislocation/subluxation,  thromboembolic complications and other imponderables were discussed.  The patient acknowledged the explanation, agreed to proceed with the plan and consent was signed. Patient is being admitted for inpatient treatment for surgery, pain control, PT, OT, prophylactic antibiotics, VTE prophylaxis, progressive ambulation and ADL's and discharge planning.The patient is planning to be discharged home with home health services the patient has significant medical comorbidities.  She does have early kidney failure with subsequent anemia of chronic disease.  Hemoglobin day  before surgery 9.1.  We will have 2 units of blood available for transfusion at the time of surgery.  Do not anticipate using transiently make acid IV because of her cardiac history.  We will use transiently make acid topically.  Kidney function could worsen as a result of the surgery.  All these medical risk are explained to the patient who understands and wishes to proceed.  Cardiac risk stratification has been performed.

## 2016-03-06 ENCOUNTER — Encounter (HOSPITAL_COMMUNITY)
Admission: RE | Disposition: A | Discharge status: Skilled Nursing Facility | Payer: Self-pay | Source: Ambulatory Visit | Attending: Orthopedic Surgery

## 2016-03-06 ENCOUNTER — Inpatient Hospital Stay (HOSPITAL_COMMUNITY): Payer: Medicare Other | Admitting: Emergency Medicine

## 2016-03-06 ENCOUNTER — Encounter (HOSPITAL_COMMUNITY): Payer: Self-pay | Admitting: *Deleted

## 2016-03-06 ENCOUNTER — Inpatient Hospital Stay (HOSPITAL_COMMUNITY): Payer: Medicare Other

## 2016-03-06 ENCOUNTER — Inpatient Hospital Stay (HOSPITAL_COMMUNITY)
Admission: RE | Admit: 2016-03-06 | Discharge: 2016-03-09 | DRG: 470 | Disposition: A | Discharge status: Skilled Nursing Facility | Payer: Medicare Other | Source: Ambulatory Visit | Attending: Orthopedic Surgery | Admitting: Orthopedic Surgery

## 2016-03-06 ENCOUNTER — Inpatient Hospital Stay (HOSPITAL_COMMUNITY): Payer: Medicare Other | Admitting: Vascular Surgery

## 2016-03-06 DIAGNOSIS — M1611 Unilateral primary osteoarthritis, right hip: Principal | ICD-10-CM | POA: Diagnosis present

## 2016-03-06 DIAGNOSIS — F419 Anxiety disorder, unspecified: Secondary | ICD-10-CM | POA: Diagnosis present

## 2016-03-06 DIAGNOSIS — D500 Iron deficiency anemia secondary to blood loss (chronic): Secondary | ICD-10-CM | POA: Diagnosis present

## 2016-03-06 DIAGNOSIS — D62 Acute posthemorrhagic anemia: Secondary | ICD-10-CM | POA: Diagnosis not present

## 2016-03-06 DIAGNOSIS — Z951 Presence of aortocoronary bypass graft: Secondary | ICD-10-CM

## 2016-03-06 DIAGNOSIS — Z794 Long term (current) use of insulin: Secondary | ICD-10-CM

## 2016-03-06 DIAGNOSIS — I129 Hypertensive chronic kidney disease with stage 1 through stage 4 chronic kidney disease, or unspecified chronic kidney disease: Secondary | ICD-10-CM | POA: Diagnosis present

## 2016-03-06 DIAGNOSIS — Z6838 Body mass index (BMI) 38.0-38.9, adult: Secondary | ICD-10-CM

## 2016-03-06 DIAGNOSIS — Z79899 Other long term (current) drug therapy: Secondary | ICD-10-CM

## 2016-03-06 DIAGNOSIS — D638 Anemia in other chronic diseases classified elsewhere: Secondary | ICD-10-CM | POA: Diagnosis present

## 2016-03-06 DIAGNOSIS — I2510 Atherosclerotic heart disease of native coronary artery without angina pectoris: Secondary | ICD-10-CM | POA: Diagnosis present

## 2016-03-06 DIAGNOSIS — N184 Chronic kidney disease, stage 4 (severe): Secondary | ICD-10-CM | POA: Diagnosis present

## 2016-03-06 DIAGNOSIS — E1122 Type 2 diabetes mellitus with diabetic chronic kidney disease: Secondary | ICD-10-CM | POA: Diagnosis present

## 2016-03-06 DIAGNOSIS — Z87891 Personal history of nicotine dependence: Secondary | ICD-10-CM | POA: Diagnosis not present

## 2016-03-06 DIAGNOSIS — E1151 Type 2 diabetes mellitus with diabetic peripheral angiopathy without gangrene: Secondary | ICD-10-CM | POA: Diagnosis present

## 2016-03-06 DIAGNOSIS — E669 Obesity, unspecified: Secondary | ICD-10-CM | POA: Diagnosis present

## 2016-03-06 DIAGNOSIS — M169 Osteoarthritis of hip, unspecified: Secondary | ICD-10-CM | POA: Diagnosis present

## 2016-03-06 DIAGNOSIS — Z419 Encounter for procedure for purposes other than remedying health state, unspecified: Secondary | ICD-10-CM

## 2016-03-06 HISTORY — PX: TOTAL HIP ARTHROPLASTY: SHX124

## 2016-03-06 LAB — CBC
HCT: 26.4 % — ABNORMAL LOW (ref 36.0–46.0)
Hemoglobin: 8 g/dL — ABNORMAL LOW (ref 12.0–15.0)
MCH: 28.3 pg (ref 26.0–34.0)
MCHC: 30.3 g/dL (ref 30.0–36.0)
MCV: 93.3 fL (ref 78.0–100.0)
Platelets: 211 K/uL (ref 150–400)
RBC: 2.83 MIL/uL — ABNORMAL LOW (ref 3.87–5.11)
RDW: 16 % — ABNORMAL HIGH (ref 11.5–15.5)
WBC: 13.2 K/uL — ABNORMAL HIGH (ref 4.0–10.5)

## 2016-03-06 LAB — GLUCOSE, CAPILLARY
GLUCOSE-CAPILLARY: 75 mg/dL (ref 65–99)
GLUCOSE-CAPILLARY: 109 mg/dL — AB (ref 65–99)
Glucose-Capillary: 51 mg/dL — ABNORMAL LOW (ref 65–99)
Glucose-Capillary: 110 mg/dL — ABNORMAL HIGH (ref 65–99)
Glucose-Capillary: 95 mg/dL (ref 65–99)
Glucose-Capillary: 137 mg/dL — ABNORMAL HIGH (ref 65–99)
Glucose-Capillary: 178 mg/dL — ABNORMAL HIGH (ref 65–99)

## 2016-03-06 LAB — CREATININE, SERUM
Creatinine, Ser: 1.36 mg/dL — ABNORMAL HIGH (ref 0.44–1.00)
GFR calc Af Amer: 45 mL/min — ABNORMAL LOW
GFR calc non Af Amer: 38 mL/min — ABNORMAL LOW

## 2016-03-06 SURGERY — ARTHROPLASTY, HIP, TOTAL, ANTERIOR APPROACH
Anesthesia: General | Site: Hip | Laterality: Right

## 2016-03-06 MED ORDER — ATORVASTATIN CALCIUM 80 MG PO TABS
80.0000 mg | ORAL_TABLET | Freq: Every morning | ORAL | Status: DC
  Administered 2016-03-06 – 2016-03-09 (×4): 80 mg via ORAL
  Filled 2016-03-06 (×4): qty 1

## 2016-03-06 MED ORDER — CARVEDILOL 12.5 MG PO TABS
12.5000 mg | ORAL_TABLET | Freq: Two times a day (BID) | ORAL | Status: DC
  Administered 2016-03-06 – 2016-03-09 (×7): 12.5 mg via ORAL
  Filled 2016-03-06 (×7): qty 1

## 2016-03-06 MED ORDER — OXYCODONE HCL 5 MG PO TABS
5.0000 mg | ORAL_TABLET | ORAL | Status: DC | PRN
  Administered 2016-03-06 – 2016-03-09 (×9): 10 mg via ORAL
  Filled 2016-03-06 (×10): qty 2

## 2016-03-06 MED ORDER — FENTANYL CITRATE (PF) 100 MCG/2ML IJ SOLN
INTRAMUSCULAR | Status: AC
  Filled 2016-03-06: qty 2

## 2016-03-06 MED ORDER — DEXTROSE 50 % IV SOLN
25.0000 mL | Freq: Once | INTRAVENOUS | Status: AC
  Administered 2016-03-06: 25 mL via INTRAVENOUS
  Filled 2016-03-06: qty 50

## 2016-03-06 MED ORDER — CHLORHEXIDINE GLUCONATE 4 % EX LIQD: 60.0000 mL | Freq: Once | CUTANEOUS | Status: DC

## 2016-03-06 MED ORDER — ISOSORBIDE DINITRATE 20 MG PO TABS
40.0000 mg | ORAL_TABLET | Freq: Two times a day (BID) | ORAL | Status: DC
  Administered 2016-03-06 – 2016-03-09 (×7): 40 mg via ORAL
  Filled 2016-03-06 (×8): qty 2

## 2016-03-06 MED ORDER — SODIUM CHLORIDE 0.9 % IR SOLN
Status: DC | PRN
  Administered 2016-03-06: 1000 mL
  Administered 2016-03-06: 3000 mL

## 2016-03-06 MED ORDER — EPHEDRINE 5 MG/ML INJ
INTRAVENOUS | Status: AC
  Filled 2016-03-06: qty 10

## 2016-03-06 MED ORDER — LACTATED RINGERS IV SOLN
INTRAVENOUS | Status: DC
  Administered 2016-03-06 (×3): via INTRAVENOUS

## 2016-03-06 MED ORDER — MORPHINE SULFATE (PF) 4 MG/ML IV SOLN
4.0000 mg | INTRAVENOUS | Status: DC | PRN
  Administered 2016-03-06 – 2016-03-07 (×3): 4 mg via INTRAVENOUS
  Filled 2016-03-06 (×4): qty 1

## 2016-03-06 MED ORDER — PHENYLEPHRINE 40 MCG/ML (10ML) SYRINGE FOR IV PUSH (FOR BLOOD PRESSURE SUPPORT)
PREFILLED_SYRINGE | INTRAVENOUS | Status: AC
  Filled 2016-03-06: qty 10

## 2016-03-06 MED ORDER — HYDROMORPHONE HCL 1 MG/ML IJ SOLN
INTRAMUSCULAR | Status: AC
  Filled 2016-03-06: qty 1

## 2016-03-06 MED ORDER — LIDOCAINE HCL (CARDIAC) 20 MG/ML IV SOLN
INTRAVENOUS | Status: DC | PRN
Start: 2016-03-06 — End: 2016-03-06
  Administered 2016-03-06: 60 mg via INTRAVENOUS
  Administered 2016-03-06: 40 mg via INTRAVENOUS

## 2016-03-06 MED ORDER — HYDROMORPHONE HCL 1 MG/ML IJ SOLN: 0.2500 mg | INTRAMUSCULAR | Status: DC | PRN

## 2016-03-06 MED ORDER — MEPERIDINE HCL 25 MG/ML IJ SOLN: 6.2500 mg | INTRAMUSCULAR | Status: DC | PRN

## 2016-03-06 MED ORDER — HYDRALAZINE HCL 50 MG PO TABS
50.0000 mg | ORAL_TABLET | Freq: Three times a day (TID) | ORAL | Status: DC
  Administered 2016-03-06 – 2016-03-09 (×10): 50 mg via ORAL
  Filled 2016-03-06 (×10): qty 1

## 2016-03-06 MED ORDER — DIPHENHYDRAMINE HCL 50 MG/ML IJ SOLN
INTRAMUSCULAR | Status: AC
  Filled 2016-03-06: qty 1

## 2016-03-06 MED ORDER — CEFAZOLIN SODIUM-DEXTROSE 2-4 GM/100ML-% IV SOLN
2.0000 g | Freq: Four times a day (QID) | INTRAVENOUS | Status: AC
  Administered 2016-03-06 (×2): 2 g via INTRAVENOUS
  Filled 2016-03-06 (×2): qty 100

## 2016-03-06 MED ORDER — CLONIDINE HCL 0.3 MG PO TABS
0.3000 mg | ORAL_TABLET | Freq: Two times a day (BID) | ORAL | Status: DC
  Administered 2016-03-06 – 2016-03-09 (×7): 0.3 mg via ORAL
  Filled 2016-03-06: qty 1
  Filled 2016-03-06: qty 3
  Filled 2016-03-06 (×3): qty 1
  Filled 2016-03-06: qty 3
  Filled 2016-03-06: qty 1
  Filled 2016-03-06 (×4): qty 3
  Filled 2016-03-06: qty 1
  Filled 2016-03-06: qty 3
  Filled 2016-03-06: qty 1

## 2016-03-06 MED ORDER — PHENYLEPHRINE HCL 10 MG/ML IJ SOLN
INTRAMUSCULAR | Status: DC | PRN
  Administered 2016-03-06: 40 ug via INTRAVENOUS

## 2016-03-06 MED ORDER — METOCLOPRAMIDE HCL 5 MG/ML IJ SOLN
5.0000 mg | Freq: Three times a day (TID) | INTRAMUSCULAR | Status: DC | PRN
  Administered 2016-03-06: 10 mg via INTRAVENOUS
  Filled 2016-03-06: qty 2

## 2016-03-06 MED ORDER — TRANEXAMIC ACID 1000 MG/10ML IV SOLN
2000.0000 mg | INTRAVENOUS | Status: AC
  Administered 2016-03-06: 2000 mg via TOPICAL
  Filled 2016-03-06: qty 20

## 2016-03-06 MED ORDER — HYDROMORPHONE HCL 1 MG/ML IJ SOLN
INTRAMUSCULAR | Status: DC | PRN
  Administered 2016-03-06: .5 mg via INTRAVENOUS
  Administered 2016-03-06: 0.5 mg via INTRAVENOUS

## 2016-03-06 MED ORDER — ACETAMINOPHEN 650 MG RE SUPP: 650.0000 mg | Freq: Four times a day (QID) | RECTAL | Status: DC | PRN

## 2016-03-06 MED ORDER — SUCCINYLCHOLINE CHLORIDE 200 MG/10ML IV SOSY
PREFILLED_SYRINGE | INTRAVENOUS | Status: AC
Start: 2016-03-06 — End: 2016-03-06
  Filled 2016-03-06: qty 10

## 2016-03-06 MED ORDER — ONDANSETRON HCL 4 MG/2ML IJ SOLN
4.0000 mg | Freq: Four times a day (QID) | INTRAMUSCULAR | Status: DC | PRN
  Administered 2016-03-06: 4 mg via INTRAVENOUS
  Filled 2016-03-06: qty 2

## 2016-03-06 MED ORDER — ONDANSETRON HCL 4 MG/2ML IJ SOLN
INTRAMUSCULAR | Status: AC
  Filled 2016-03-06: qty 2

## 2016-03-06 MED ORDER — DOCUSATE SODIUM 100 MG PO CAPS
100.0000 mg | ORAL_CAPSULE | Freq: Two times a day (BID) | ORAL | Status: DC
  Administered 2016-03-06 – 2016-03-09 (×7): 100 mg via ORAL
  Filled 2016-03-06 (×7): qty 1

## 2016-03-06 MED ORDER — ONDANSETRON HCL 4 MG PO TABS: 4.0000 mg | ORAL_TABLET | Freq: Four times a day (QID) | ORAL | Status: DC | PRN

## 2016-03-06 MED ORDER — SUCCINYLCHOLINE CHLORIDE 20 MG/ML IJ SOLN
INTRAMUSCULAR | Status: DC | PRN
Start: 2016-03-06 — End: 2016-03-06
  Administered 2016-03-06: 120 mg via INTRAVENOUS

## 2016-03-06 MED ORDER — ROCURONIUM BROMIDE 10 MG/ML (PF) SYRINGE
PREFILLED_SYRINGE | INTRAVENOUS | Status: AC
  Filled 2016-03-06: qty 10

## 2016-03-06 MED ORDER — MENTHOL 3 MG MT LOZG: 1.0000 | LOZENGE | OROMUCOSAL | Status: DC | PRN

## 2016-03-06 MED ORDER — VITAMIN D3 25 MCG (1000 UNIT) PO TABS
1000.0000 [IU] | ORAL_TABLET | Freq: Every day | ORAL | Status: DC | PRN
  Filled 2016-03-06: qty 1

## 2016-03-06 MED ORDER — MAGNESIUM OXIDE 400 (241.3 MG) MG PO TABS
200.0000 mg | ORAL_TABLET | Freq: Every morning | ORAL | Status: DC
  Administered 2016-03-06 – 2016-03-09 (×4): 200 mg via ORAL
  Filled 2016-03-06 (×7): qty 1

## 2016-03-06 MED ORDER — ACETAMINOPHEN 325 MG PO TABS
650.0000 mg | ORAL_TABLET | Freq: Four times a day (QID) | ORAL | Status: DC | PRN
  Administered 2016-03-06: 650 mg via ORAL
  Filled 2016-03-06: qty 2

## 2016-03-06 MED ORDER — LABETALOL HCL 5 MG/ML IV SOLN
INTRAVENOUS | Status: DC | PRN
  Administered 2016-03-06: 5 mg via INTRAVENOUS

## 2016-03-06 MED ORDER — PROPOFOL 10 MG/ML IV BOLUS
INTRAVENOUS | Status: DC | PRN
  Administered 2016-03-06: 120 mg via INTRAVENOUS

## 2016-03-06 MED ORDER — PHENYLEPHRINE HCL 10 MG/ML IJ SOLN
INTRAVENOUS | Status: DC | PRN
  Administered 2016-03-06: 30 ug/min via INTRAVENOUS

## 2016-03-06 MED ORDER — BISACODYL 10 MG RE SUPP: 10.0000 mg | Freq: Every day | RECTAL | Status: DC | PRN

## 2016-03-06 MED ORDER — PHENOL 1.4 % MT LIQD: 1.0000 | OROMUCOSAL | Status: DC | PRN

## 2016-03-06 MED ORDER — FUROSEMIDE 80 MG PO TABS
80.0000 mg | ORAL_TABLET | Freq: Every day | ORAL | Status: DC
  Administered 2016-03-06 – 2016-03-09 (×4): 80 mg via ORAL
  Filled 2016-03-06 (×4): qty 1
  Filled 2016-03-06 (×4): qty 2

## 2016-03-06 MED ORDER — ENOXAPARIN SODIUM 30 MG/0.3ML ~~LOC~~ SOLN
30.0000 mg | SUBCUTANEOUS | Status: DC
  Administered 2016-03-07 – 2016-03-09 (×3): 30 mg via SUBCUTANEOUS
  Filled 2016-03-06 (×3): qty 0.3

## 2016-03-06 MED ORDER — SODIUM CHLORIDE 0.9 % IV SOLN
INTRAVENOUS | Status: DC | PRN
  Administered 2016-03-06: 08:00:00 via INTRAVENOUS

## 2016-03-06 MED ORDER — SUGAMMADEX SODIUM 200 MG/2ML IV SOLN
INTRAVENOUS | Status: DC | PRN
  Administered 2016-03-06: 200 mg via INTRAVENOUS

## 2016-03-06 MED ORDER — FENTANYL CITRATE (PF) 100 MCG/2ML IJ SOLN
INTRAMUSCULAR | Status: DC | PRN
  Administered 2016-03-06 (×3): 50 ug via INTRAVENOUS
  Administered 2016-03-06: 100 ug via INTRAVENOUS
  Administered 2016-03-06: 50 ug via INTRAVENOUS
  Administered 2016-03-06: 100 ug via INTRAVENOUS

## 2016-03-06 MED ORDER — GLYCOPYRROLATE 0.2 MG/ML IJ SOLN
INTRAMUSCULAR | Status: DC | PRN
  Administered 2016-03-06: 0.2 mg via INTRAVENOUS

## 2016-03-06 MED ORDER — PROPOFOL 10 MG/ML IV BOLUS
INTRAVENOUS | Status: AC
  Filled 2016-03-06: qty 20

## 2016-03-06 MED ORDER — 0.9 % SODIUM CHLORIDE (POUR BTL) OPTIME
TOPICAL | Status: DC | PRN
  Administered 2016-03-06 (×2): 1000 mL

## 2016-03-06 MED ORDER — FERROUS SULFATE 325 (65 FE) MG PO TABS
325.0000 mg | ORAL_TABLET | Freq: Every day | ORAL | Status: DC
  Administered 2016-03-07 – 2016-03-09 (×3): 325 mg via ORAL
  Filled 2016-03-06 (×3): qty 1

## 2016-03-06 MED ORDER — SUGAMMADEX SODIUM 200 MG/2ML IV SOLN
INTRAVENOUS | Status: AC
  Filled 2016-03-06: qty 2

## 2016-03-06 MED ORDER — LACTATED RINGERS IV SOLN: INTRAVENOUS | Status: DC

## 2016-03-06 MED ORDER — DEXTROSE 50 % IV SOLN
INTRAVENOUS | Status: AC
  Administered 2016-03-06: 25 mL via INTRAVENOUS
  Filled 2016-03-06: qty 50

## 2016-03-06 MED ORDER — COENZYME Q10 200 MG PO CAPS: 200.0000 mg | ORAL_CAPSULE | Freq: Every morning | ORAL | Status: DC

## 2016-03-06 MED ORDER — INSULIN GLARGINE 100 UNIT/ML ~~LOC~~ SOLN
20.0000 [IU] | Freq: Two times a day (BID) | SUBCUTANEOUS | Status: DC
  Administered 2016-03-06 – 2016-03-09 (×6): 20 [IU] via SUBCUTANEOUS
  Filled 2016-03-06 (×8): qty 0.2

## 2016-03-06 MED ORDER — EPHEDRINE SULFATE 50 MG/ML IJ SOLN
INTRAMUSCULAR | Status: DC | PRN
  Administered 2016-03-06: 10 mg via INTRAVENOUS
  Administered 2016-03-06 (×2): 5 mg via INTRAVENOUS
  Administered 2016-03-06 (×3): 10 mg via INTRAVENOUS

## 2016-03-06 MED ORDER — ONDANSETRON HCL 4 MG/2ML IJ SOLN
INTRAMUSCULAR | Status: DC | PRN
  Administered 2016-03-06: 4 mg via INTRAVENOUS

## 2016-03-06 MED ORDER — DIPHENHYDRAMINE HCL 50 MG/ML IJ SOLN
INTRAMUSCULAR | Status: DC | PRN
  Administered 2016-03-06: 12.5 mg via INTRAVENOUS

## 2016-03-06 MED ORDER — POTASSIUM CHLORIDE IN NACL 20-0.9 MEQ/L-% IV SOLN
INTRAVENOUS | Status: AC
  Administered 2016-03-06: 13:00:00 via INTRAVENOUS
  Filled 2016-03-06 (×2): qty 1000

## 2016-03-06 MED ORDER — METOCLOPRAMIDE HCL 5 MG PO TABS: 5.0000 mg | ORAL_TABLET | Freq: Three times a day (TID) | ORAL | Status: DC | PRN

## 2016-03-06 MED ORDER — ROCURONIUM 10MG/ML (10ML) SYRINGE FOR MEDFUSION PUMP - OPTIME
INTRAVENOUS | Status: DC | PRN
  Administered 2016-03-06: 10 mg via INTRAVENOUS
  Administered 2016-03-06: 50 mg via INTRAVENOUS

## 2016-03-06 MED ORDER — LIDOCAINE 2% (20 MG/ML) 5 ML SYRINGE
INTRAMUSCULAR | Status: AC
  Filled 2016-03-06: qty 5

## 2016-03-06 MED ORDER — CYANOCOBALAMIN 500 MCG PO TABS
500.0000 ug | ORAL_TABLET | Freq: Every morning | ORAL | Status: DC
Start: 2016-03-06 — End: 2016-03-09
  Administered 2016-03-06 – 2016-03-09 (×4): 500 ug via ORAL
  Filled 2016-03-06 (×5): qty 1

## 2016-03-06 MED ORDER — PROMETHAZINE HCL 25 MG/ML IJ SOLN: 6.2500 mg | INTRAMUSCULAR | Status: DC | PRN

## 2016-03-06 MED ORDER — MIDAZOLAM HCL 5 MG/5ML IJ SOLN
INTRAMUSCULAR | Status: DC | PRN
  Administered 2016-03-06: 2 mg via INTRAVENOUS

## 2016-03-06 MED ORDER — MIDAZOLAM HCL 2 MG/2ML IJ SOLN
INTRAMUSCULAR | Status: AC
  Filled 2016-03-06: qty 2

## 2016-03-06 MED ORDER — LIRAGLUTIDE 18 MG/3ML ~~LOC~~ SOPN: 1.8000 mg | PEN_INJECTOR | Freq: Every morning | SUBCUTANEOUS | Status: DC

## 2016-03-06 SURGICAL SUPPLY — 62 items
BENZOIN TINCTURE PRP APPL 2/3 (GAUZE/BANDAGES/DRESSINGS) ×3 IMPLANT
BLADE SAW SGTL 18X1.27X75 (BLADE) ×2 IMPLANT
BLADE SAW SGTL 18X1.27X75MM (BLADE) ×1
BLADE SURG ROTATE 9660 (MISCELLANEOUS) IMPLANT
CAPT HIP TOTAL 2 ×3 IMPLANT
CELLS DAT CNTRL 66122 CELL SVR (MISCELLANEOUS) ×1 IMPLANT
CLOSURE WOUND 1/2 X4 (GAUZE/BANDAGES/DRESSINGS) ×1
COVER SURGICAL LIGHT HANDLE (MISCELLANEOUS) ×3 IMPLANT
DRAPE C-ARM 42X72 X-RAY (DRAPES) ×3 IMPLANT
DRAPE STERI IOBAN 125X83 (DRAPES) ×3 IMPLANT
DRAPE U-SHAPE 47X51 STRL (DRAPES) ×9 IMPLANT
DRSG AQUACEL AG ADV 3.5X10 (GAUZE/BANDAGES/DRESSINGS) ×3 IMPLANT
DURAPREP 26ML APPLICATOR (WOUND CARE) ×3 IMPLANT
ELECT BLADE 4.0 EZ CLEAN MEGAD (MISCELLANEOUS) ×6
ELECT BLADE 6.5 EXT (BLADE) IMPLANT
ELECT REM PT RETURN 9FT ADLT (ELECTROSURGICAL) ×3
ELECTRODE BLDE 4.0 EZ CLN MEGD (MISCELLANEOUS) ×2 IMPLANT
ELECTRODE REM PT RTRN 9FT ADLT (ELECTROSURGICAL) ×1 IMPLANT
FACESHIELD WRAPAROUND (MASK) IMPLANT
GAUZE XEROFORM 1X8 LF (GAUZE/BANDAGES/DRESSINGS) ×3 IMPLANT
GLOVE BIOGEL PI IND STRL 8 (GLOVE) ×2 IMPLANT
GLOVE BIOGEL PI INDICATOR 8 (GLOVE) ×4
GLOVE ECLIPSE 8.0 STRL XLNG CF (GLOVE) IMPLANT
GLOVE ORTHO TXT STRL SZ7.5 (GLOVE) ×3 IMPLANT
GLOVE SKINSENSE NS SZ7.5 (GLOVE) ×2
GLOVE SKINSENSE STRL SZ7.5 (GLOVE) ×1 IMPLANT
GLOVE SURG SS PI 6.5 STRL IVOR (GLOVE) ×3 IMPLANT
GLOVE SURG SYN 7.5  E (GLOVE) ×2
GLOVE SURG SYN 7.5 E (GLOVE) ×1 IMPLANT
GOWN STRL REIN XL XLG (GOWN DISPOSABLE) ×3 IMPLANT
GOWN STRL REUS W/ TWL LRG LVL3 (GOWN DISPOSABLE) ×2 IMPLANT
GOWN STRL REUS W/ TWL XL LVL3 (GOWN DISPOSABLE) ×2 IMPLANT
GOWN STRL REUS W/TWL LRG LVL3 (GOWN DISPOSABLE) ×4
GOWN STRL REUS W/TWL XL LVL3 (GOWN DISPOSABLE) ×4
HANDPIECE INTERPULSE COAX TIP (DISPOSABLE) ×2
HOOD PEEL AWAY FLYTE STAYCOOL (MISCELLANEOUS) ×3 IMPLANT
KIT BASIN OR (CUSTOM PROCEDURE TRAY) ×3 IMPLANT
KIT ROOM TURNOVER OR (KITS) ×3 IMPLANT
MANIFOLD NEPTUNE II (INSTRUMENTS) ×3 IMPLANT
NS IRRIG 1000ML POUR BTL (IV SOLUTION) ×3 IMPLANT
PACK TOTAL JOINT (CUSTOM PROCEDURE TRAY) ×3 IMPLANT
PAD ARMBOARD 7.5X6 YLW CONV (MISCELLANEOUS) ×3 IMPLANT
PENCIL BUTTON HOLSTER BLD 10FT (ELECTRODE) ×3 IMPLANT
RTRCTR WOUND ALEXIS 18CM MED (MISCELLANEOUS) ×3
SEALER BIPOLAR AQUA 6.0 (INSTRUMENTS) ×3 IMPLANT
SET HNDPC FAN SPRY TIP SCT (DISPOSABLE) ×1 IMPLANT
SPONGE LAP 18X18 X RAY DECT (DISPOSABLE) ×3 IMPLANT
STAPLER VISISTAT 35W (STAPLE) IMPLANT
STRIP CLOSURE SKIN 1/2X4 (GAUZE/BANDAGES/DRESSINGS) ×2 IMPLANT
SUT ETHIBOND NAB CT1 #1 30IN (SUTURE) ×3 IMPLANT
SUT ETHILON 3 0 PS 1 (SUTURE) ×3 IMPLANT
SUT MNCRL AB 4-0 PS2 18 (SUTURE) IMPLANT
SUT VIC AB 0 CT1 27 (SUTURE) ×6
SUT VIC AB 0 CT1 27XBRD ANBCTR (SUTURE) ×3 IMPLANT
SUT VIC AB 1 CT1 27 (SUTURE) ×4
SUT VIC AB 1 CT1 27XBRD ANBCTR (SUTURE) ×2 IMPLANT
SUT VIC AB 2-0 CT1 27 (SUTURE) ×4
SUT VIC AB 2-0 CT1 TAPERPNT 27 (SUTURE) ×2 IMPLANT
TOWEL OR 17X24 6PK STRL BLUE (TOWEL DISPOSABLE) ×3 IMPLANT
TOWEL OR 17X26 10 PK STRL BLUE (TOWEL DISPOSABLE) ×3 IMPLANT
TRAY FOLEY CATH 16FRSI W/METER (SET/KITS/TRAYS/PACK) IMPLANT
WATER STERILE IRR 1000ML POUR (IV SOLUTION) ×6 IMPLANT

## 2016-03-06 NOTE — Brief Op Note (Signed)
03/06/2016  11:12 AM  PATIENT:  Norma [name]  70 y.o. female  PRE-OPERATIVE DIAGNOSIS:  RIGHT HIP OSTEOARTHRITIS  POST-OPERATIVE DIAGNOSIS:  RIGHT HIP OSTEOARTHRITIS  PROCEDURE:  Procedure(s): RIGHT TOTAL HIP ARTHROPLASTY ANTERIOR APPROACH  SURGEON:  Surgeon(s): Meredith Pel, MD Naiping Ephriam Jenkins, MD  ASSISTANT: Laure Kidney RNFA  ANESTHESIA:   general  EBL: 200 ml    Total I/O In: 1600 [I.V.:1600] Out: 200 [Blood:200]  BLOOD ADMINISTERED: none  DRAINS: none   LOCAL MEDICATIONS USED:  none  SPECIMEN:  No Specimen  COUNTS:  YES  TOURNIQUET:  * No tourniquets in log *  DICTATION: .Other Dictation: Dictation Number [id number]  PLAN OF CARE: Admit to inpatient   PATIENT DISPOSITION:  PACU - hemodynamically stable

## 2016-03-06 NOTE — Progress Notes (Signed)
PHARMACIST - PHYSICIAN ORDER COMMUNICATION  CONCERNING: P&T Medication Policy on Herbal Medications  DESCRIPTION:  This patient's order for:  CoQ10  has been noted.  This product(s) is classified as an "herbal" or natural product. Due to a lack of definitive safety studies or FDA approval, nonstandard manufacturing practices, plus the potential risk of unknown drug-drug interactions while on inpatient medications, the Pharmacy and Therapeutics Committee does not permit the use of "herbal" or natural products of this type within [facility].   ACTION TAKEN: The pharmacy department is unable to verify this order at this time and your patient has been informed of this safety policy. Please reevaluate patient's clinical condition at discharge and address if the herbal or natural product(s) should be resumed at that time.  Georga Bora, PharmD Clinical Pharmacist Pager: [phone] 03/06/2016 12:47 PM

## 2016-03-06 NOTE — Anesthesia Preprocedure Evaluation (Addendum)
Anesthesia Evaluation  Patient identified by MRN, date of birth, ID band Patient awake    Reviewed: Allergy & Precautions, NPO status , Patient's Chart, lab work & pertinent test results, reviewed documented beta blocker date and time   Airway Mallampati: III  TM Distance: >3 FB Neck ROM: Full    Dental  (+) Teeth Intact, Dental Advisory Given   Pulmonary former smoker,    breath sounds clear to auscultation       Cardiovascular hypertension, Pt. on home beta blockers and Pt. on medications + CAD, + CABG and + Peripheral Vascular Disease   Rhythm:Regular Rate:Normal     Neuro/Psych PSYCHIATRIC DISORDERS Anxiety negative neurological ROS     GI/Hepatic negative GI ROS, Neg liver ROS,   Endo/Other  diabetes, Type 2, Insulin Dependent  Renal/GU CRFRenal disease  negative genitourinary   Musculoskeletal  (+) Arthritis ,   Abdominal (+) + obese,   Peds negative pediatric ROS (+)  Hematology negative hematology ROS (+)   Anesthesia Other Findings   Reproductive/Obstetrics negative OB ROS                            Lab Results  Component Value Date   WBC 6.1 02/27/2016   HGB 8.7 (L) 02/27/2016   HCT 28.7 (L) 02/27/2016   MCV 93.2 02/27/2016   PLT 252 02/27/2016   Lab Results  Component Value Date   INR 1.09 01/18/2015   INR 1.20 05/28/2011   INR 1.20 05/27/2011   - Left ventricle: The cavity size was normal. Wall thickness was increased in a pattern of severe LVH. Systolic function was normal. The estimated ejection fraction was in the range of 60% to 65%. Wall motion was normal; there were no regional wall motion abnormalities. Doppler parameters are consistent with abnormal left ventricular relaxation (grade 1 diastolic dysfunction). Doppler parameters are consistent with high ventricular filling pressure. - Mitral valve: Calcified annulus. - Left atrium: The  atrium was moderately dilated. - Pulmonary arteries: Systolic pressure was moderately increased. PA peak pressure: 70mm Hg (S). - Pericardium, extracardiac: A trivial pericardial effusion was identified.  Anesthesia Physical Anesthesia Plan  ASA: III  Anesthesia Plan: General   Post-op Pain Management:    Induction: Intravenous  Airway Management Planned: LMA  Additional Equipment:   Intra-op Plan:   Post-operative Plan: Extubation in OR  Informed Consent: I have reviewed the patients History and Physical, chart, labs and discussed the procedure including the risks, benefits and alternatives for the proposed anesthesia with the patient or authorized representative who has indicated his/her understanding and acceptance.     Plan Discussed with: CRNA  Anesthesia Plan Comments: (Pt declines spinal anesthesia. )       Anesthesia Quick Evaluation

## 2016-03-06 NOTE — Progress Notes (Signed)
Bladder scan showed 288 mL, I&O straight cath removed 550 mL of clear yellow urine. Pt DTV now by 0100. Will pass along to night RN and continue to monitor

## 2016-03-06 NOTE — Progress Notes (Signed)
Verified with Hinton Dyer in Blood Bank that 2 units are available.

## 2016-03-06 NOTE — Anesthesia Postprocedure Evaluation (Signed)
Anesthesia Post Note  Patient: Norma [name]  Procedure(s) Performed: Procedure(s) (LRB): RIGHT TOTAL HIP ARTHROPLASTY ANTERIOR APPROACH (Right)  Patient location during evaluation: PACU Anesthesia Type: General Level of consciousness: awake and alert Pain management: pain level controlled Vital Signs Assessment: post-procedure vital signs reviewed and stable Respiratory status: spontaneous breathing, nonlabored ventilation, respiratory function stable and patient connected to nasal cannula oxygen Cardiovascular status: blood pressure returned to baseline and stable Postop Assessment: no signs of nausea or vomiting Anesthetic complications: no    Last Vitals:  Vitals:   03/06/16 1215 03/06/16 1217  BP: (!) 192/64   Pulse: (!) 53 (!) 58  Resp: 15 15  Temp: 36.1 C     Last Pain:  Vitals:                 Effie Berkshire

## 2016-03-06 NOTE — Transfer of Care (Signed)
Immediate Anesthesia Transfer of Care Note  Patient: Norma [name]  Procedure(s) Performed: Procedure(s): RIGHT TOTAL HIP ARTHROPLASTY ANTERIOR APPROACH (Right)  Patient Location: PACU  Anesthesia Type:General  Level of Consciousness: awake, alert  and oriented  Airway & Oxygen Therapy: Patient Spontanous Breathing and Patient connected to face mask oxygen  Post-op Assessment: Report given to RN and Post -op Vital signs reviewed and stable  Post vital signs: Reviewed and stable  Last Vitals:  Vitals:   03/06/16 0650  BP: (!) 164/51  Pulse: 66  Resp: 18  Temp: 36.7 C    Last Pain: There were no vitals filed for this visit.    Patients Stated Pain Goal: 4 ([id number])  Complications: No apparent anesthesia complications

## 2016-03-06 NOTE — Evaluation (Signed)
Physical Therapy Evaluation Patient Details Name: Norma [name] MRN: [id number] DOB: 06-Nov-1945 Today's Date: 03/06/2016   History of Present Illness  Pt is 70 yo female who was admitted 8/29 for elective R direct anterior THA.  Clinical Impression  Pt is s/p TKA resulting in the deficits listed below (see PT Problem List). Pt tolerated OOB well for first time OOB. Pt will benefit from skilled PT to increase their independence and safety with mobility to allow discharge to the venue listed below.      Follow Up Recommendations SNF;Supervision/Assistance - 24 hour    Equipment Recommendations  None recommended by PT (TBD at next venue)    Recommendations for Other Services       Precautions / Restrictions Precautions Precautions: None Restrictions Weight Bearing Restrictions: Yes RLE Weight Bearing: Weight bearing as tolerated      Mobility  Bed Mobility Overal bed mobility: Needs Assistance Bed Mobility: Supine to Sit     Supine to sit: Min assist;HOB elevated     General bed mobility comments: assist for R LE and trunk elevation  Transfers Overall transfer level: Needs assistance Equipment used: Rolling walker (2 wheeled) Transfers: Sit to/from [facility] Sit to Stand: Min assist Stand pivot transfers: Min assist       General transfer comment: increased time, 2 attempts prior to stand, v/c's for hand placement  Ambulation/Gait                Stairs            Wheelchair Mobility    Modified Rankin (Stroke Patients Only)       Balance                                             Pertinent Vitals/Pain Pain Assessment: 0-10 Pain Score: 5  Pain Location: R hip Pain Descriptors / Indicators: Sore Pain Intervention(s): Patient requesting pain meds-RN notified    Home Living Family/patient expects to be discharged to:: Skilled nursing facility Living Arrangements: Alone                     Prior Function Level of Independence: Independent         Comments: used RW, did grocery shopping, drove     Hand Dominance   Dominant Hand: Right    Extremity/Trunk Assessment   Upper Extremity Assessment: Generalized weakness           Lower Extremity Assessment: RLE deficits/detail RLE Deficits / Details: able to initiate hip/knee flexion in supine and quad set LLE Deficits / Details: generalized weakness  Cervical / Trunk Assessment: Normal  Communication   Communication: No difficulties  Cognition Arousal/Alertness: Awake/alert Behavior During Therapy: Flat affect (soft spoken) Overall Cognitive Status: Within Functional Limits for tasks assessed                      General Comments      Exercises Total Joint Exercises Ankle Circles/Pumps: AROM;Both;10 reps Quad Sets: AROM;Right;10 reps;Supine Heel Slides: AAROM;Right;10 reps;Supine      Assessment/Plan    PT Assessment Patient needs continued PT services  PT Diagnosis Difficulty walking;Acute pain   PT Problem List Decreased strength;Decreased range of motion;Decreased activity tolerance;Decreased balance;Decreased mobility  PT Treatment Interventions DME instruction;Gait training;Stair training;Functional mobility training;Therapeutic activities;Therapeutic exercise;Balance training   PT Goals (Current goals can  be found in the Care Plan section) Acute Rehab PT Goals Patient Stated Goal: go to rehab PT Goal Formulation: With patient Time For Goal Achievement: 03/13/16 Potential to Achieve Goals: Good    Frequency 7X/week   Barriers to discharge Decreased caregiver support lives alone    Co-evaluation               End of Session Equipment Utilized During Treatment: Gait belt;Oxygen Activity Tolerance: Patient tolerated treatment well Patient left: in chair;with call bell/phone within reach;with family/visitor present Nurse Communication: Mobility status         Time:  [id number] PT Time Calculation (min) (ACUTE ONLY): 21 min   Charges:   PT Evaluation $PT Eval Moderate Complexity: 1 Procedure     PT G CodesKingsley Callander 03/06/2016, 3:36 PM   Kittie Plater, PT, DPT Pager #: [phone] Office #: [phone]

## 2016-03-06 NOTE — Interval H&P Note (Signed)
History and Physical Interval Note:  03/06/2016 7:24 AM  Norma [name]  has presented today for surgery, with the diagnosis of RIGHT HIP OSTEOARTHRITIS  The various methods of treatment have been discussed with the patient and family. After consideration of risks, benefits and other options for treatment, the patient has consented to  Procedure(s): RIGHT TOTAL HIP ARTHROPLASTY ANTERIOR APPROACH (Right) as a surgical intervention .  The patient's history has been reviewed, patient examined, no change in status, stable for surgery.  I have reviewed the patient's chart and labs.  Questions were answered to the patient's satisfaction.  HGB 9.1 - hgb a1c 6.9 - skin ok at site - 2 u prbc available if needed   [facility]

## 2016-03-06 NOTE — Progress Notes (Signed)
Inpatient Diabetes Program Recommendations  AACE/ADA: New Consensus Statement on Inpatient Glycemic Control (2015)  Target Ranges:  Prepandial:   less than 140 mg/dL      Peak postprandial:   less than 180 mg/dL (1-2 hours)      Critically ill patients:  140 - 180 mg/dL   Results for Norma [name], Norma [name] (MRN [id number]) as of 03/06/2016 15:59  Ref. Range 03/06/2016 06:21 03/06/2016 06:52 03/06/2016 08:45 03/06/2016 09:29 03/06/2016 11:24  Glucose-Capillary Latest Ref Range: 65 - 99 mg/dL 51 (L) 110 (H) 75 95 109 (H)  Results for Norma [name], Norma [name] (MRN [id number]) as of 03/06/2016 15:59  Ref. Range 02/27/2016 13:19  Hemoglobin A1C Latest Ref Range: 4.8 - 5.6 % 6.9 (H)   Review of Glycemic Control  Diabetes history: DM2 Outpatient Diabetes medications: Victoza 1.8 mg daily, Lantus 20 units BID Current orders for Inpatient glycemic control: Lantus 20 units BID  Inpatient Diabetes Program Recommendations: Insulin - Basal: Initial glucose 51 mg/dl today prior to surgery. CBGs since arrival to hospital have ranged from 51-110 mg/dl.  Please consider decreasing Lantus to 10 units BID and can increase if needed based on glycemic control. Correction (SSI): While inpatient, please consider ordering CBGs with Novolog correction ACHS. HgbA1C: A1C 6.9% on 02/27/16 which is an improvement from 9.0% on 01/11/16 (per PCP Tammy Boyd's office note dated 01/11/16).  Thanks, Barnie Alderman, RN, MSN, CDE Diabetes Coordinator Inpatient Diabetes Program [phone] (Team Pager from [place] to [place]) [phone] ([facility] office) [phone] [facility] office) [phone] [facility] office)

## 2016-03-06 NOTE — Anesthesia Procedure Notes (Signed)
Procedure Name: Intubation Date/Time: 03/06/2016 7:38 AM Performed by: Huey Romans ANN Pre-anesthesia Checklist: Patient identified, Emergency Drugs available, Suction available and Patient being monitored Patient Re-evaluated:Patient Re-evaluated prior to inductionOxygen Delivery Method: Circle System Utilized Preoxygenation: Pre-oxygenation with 100% oxygen Intubation Type: IV induction Ventilation: Mask ventilation without difficulty Laryngoscope Size: Miller and 2 Grade View: Grade I Tube type: Oral Tube size: 7.0 mm Number of attempts: 1 Airway Equipment and Method: Stylet and Oral airway Placement Confirmation: ETT inserted through vocal cords under direct vision,  positive ETCO2 and breath sounds checked- equal and bilateral Secured at: 22 cm Tube secured with: Tape Dental Injury: Teeth and Oropharynx as per pre-operative assessment

## 2016-03-07 ENCOUNTER — Encounter (HOSPITAL_COMMUNITY): Payer: Self-pay | Admitting: Orthopedic Surgery

## 2016-03-07 LAB — BASIC METABOLIC PANEL
ANION GAP: 7 (ref 5–15)
BUN: 39 mg/dL — AB (ref 6–20)
CALCIUM: 7.9 mg/dL — AB (ref 8.9–10.3)
CO2: 23 mmol/L (ref 22–32)
Chloride: 111 mmol/L (ref 101–111)
Creatinine, Ser: 1.89 mg/dL — ABNORMAL HIGH (ref 0.44–1.00)
GFR calc Af Amer: 30 mL/min — ABNORMAL LOW (ref >60)
GFR, EST NON AFRICAN AMERICAN: 26 mL/min — AB (ref >60)
GLUCOSE: 137 mg/dL — AB (ref 65–99)
Potassium: 4.8 mmol/L (ref 3.5–5.1)
Sodium: 141 mmol/L (ref 135–145)

## 2016-03-07 LAB — GLUCOSE, CAPILLARY
GLUCOSE-CAPILLARY: 148 mg/dL — AB (ref 65–99)
GLUCOSE-CAPILLARY: 150 mg/dL — AB (ref 65–99)
Glucose-Capillary: 199 mg/dL — ABNORMAL HIGH (ref 65–99)
Glucose-Capillary: 173 mg/dL — ABNORMAL HIGH (ref 65–99)

## 2016-03-07 LAB — CBC
HEMATOCRIT: 22.5 % — AB (ref 36.0–46.0)
Hemoglobin: 6.9 g/dL — CL (ref 12.0–15.0)
MCH: 28.6 pg (ref 26.0–34.0)
MCHC: 30.7 g/dL (ref 30.0–36.0)
MCV: 93.4 fL (ref 78.0–100.0)
PLATELETS: 162 10*3/uL (ref 150–400)
RBC: 2.41 MIL/uL — ABNORMAL LOW (ref 3.87–5.11)
RDW: 16.1 % — AB (ref 11.5–15.5)
WBC: 7.8 10*3/uL (ref 4.0–10.5)

## 2016-03-07 LAB — PREPARE RBC (CROSSMATCH)

## 2016-03-07 MED ORDER — SODIUM CHLORIDE 0.9 % IV SOLN
Freq: Once | INTRAVENOUS | Status: AC
  Administered 2016-03-07: 08:00:00 via INTRAVENOUS

## 2016-03-07 MED ORDER — CALCIUM CARBONATE 1250 (500 CA) MG PO TABS
1.0000 | ORAL_TABLET | Freq: Three times a day (TID) | ORAL | Status: DC
  Administered 2016-03-07 – 2016-03-09 (×7): 500 mg via ORAL
  Filled 2016-03-07 (×6): qty 1

## 2016-03-07 NOTE — Progress Notes (Signed)
Patient still unable to void. Tried BSC without success. Patient was able to dribble a little standing up. Bladder scan showed ~207. Per patient no complaints of bladder discomfort, only slight urge to urinate. In and out cath done since been over 8 hours. Got out only 11ml of clear yellow urine. Will continue to monitor.

## 2016-03-07 NOTE — [place] FL2 (Signed)
[place] MEDICAID FL2 LEVEL OF CARE SCREENING TOOL     IDENTIFICATION  Patient Name: Norma [name] Birthdate: Jun 29, 1946 Sex: female Admission Date (Current Location): 03/06/2016  [facility] and [place] Number:  Herbalist and Address:  [facility], [place] [address] [place], [place] [address]      Provider Number: [id number]  Attending Physician Name and Address:  Meredith Pel, MD  Relative Name and Phone Number:       Current Level of Care: Hospital Recommended Level of Care: [place] Prior Approval Number:    Date Approved/Denied:   PASRR Number: [id number]  Discharge Plan: SNF    Current Diagnoses: Patient Active Problem List   Diagnosis Date Noted  . Degenerative arthritis of hip 03/06/2016  . Gout of left knee 10/31/2015  . Right hip pain 10/29/2015  . DM type 2 causing CKD stage 3 ([place]) 10/29/2015  . Morbid obesity ([place]) 10/29/2015  . CKD (chronic kidney disease) stage 4, GFR 15-29 ml/min (HCC) 04/18/2014  . Aortic atherosclerosis ([place]) 04/18/2014  . Bilateral carotid bruits 04/16/2014  . Pulsatile tinnitus of right ear 04/16/2014  . Diarrhea 09/18/2011  . Peripheral vascular disease, unspecified ([place]) 08/14/2011  . Claudication ([place]) 08/14/2011  . Edema 06/29/2011  . Leucocytosis 06/12/2011  . Acute on chronic kidney disease, stage 4 05/28/2011  . Respiratory failure, acute ([place]) 05/25/2011    Class: Acute  . S/P aorto-bifemoral bypass surgery 05/25/2011    Class: Acute  . PAD (peripheral artery disease) ([place])   . Coronary artery disease   . Hyperlipidemia   . Hypertension     Orientation RESPIRATION BLADDER Height & Weight     Self, Situation, Place, Time  Normal Continent Weight: 208 lb (94.3 kg) Height:     BEHAVIORAL SYMPTOMS/MOOD NEUROLOGICAL BOWEL NUTRITION STATUS      Continent Diet  AMBULATORY STATUS COMMUNICATION OF NEEDS Skin   Limited Assist Verbally Surgical wounds                        Personal Care Assistance Level of Assistance  Bathing, Dressing Bathing Assistance: Limited assistance   Dressing Assistance: Limited assistance     Functional Limitations Info             SPECIAL CARE FACTORS FREQUENCY  PT (By licensed PT)     PT Frequency: daily OT Frequency: daily            Contractures      Additional Factors Info  Allergies, Code Status Code Status Info: FULL Allergies Info: Iohexol, Sulfa Antibiotics           Current Medications (03/07/2016):  This is the current hospital active medication list Current Facility-Administered Medications  Medication Dose Route Frequency Provider Last Rate Last Dose  . acetaminophen (TYLENOL) tablet 650 mg  650 mg Oral Q6H PRN Meredith Pel, MD   650 mg at 03/06/16 2217   Or  . acetaminophen (TYLENOL) suppository 650 mg  650 mg Rectal Q6H PRN Meredith Pel, MD      . atorvastatin (LIPITOR) tablet 80 mg  80 mg Oral q morning - 10a Meredith Pel, MD   80 mg at 03/07/16 1045  . bisacodyl (DULCOLAX) suppository 10 mg  10 mg Rectal Daily PRN Meredith Pel, MD      . calcium carbonate (OS-CAL - dosed in mg of elemental calcium) tablet 500 mg of elemental calcium  1  tablet Oral TID WC Meredith Pel, MD   500 mg of elemental calcium at 03/07/16 0824  . carvedilol (COREG) tablet 12.5 mg  12.5 mg Oral BID Meredith Pel, MD   12.5 mg at 03/07/16 1045  . cholecalciferol (VITAMIN D) tablet 1,000 Units  1,000 Units Oral Daily PRN Meredith Pel, MD      . cloNIDine (CATAPRES) tablet 0.3 mg  0.3 mg Oral BID Meredith Pel, MD   0.3 mg at 03/07/16 1045  . cyanocobalamin tablet 500 mcg  500 mcg Oral q morning - 10a Meredith Pel, MD   500 mcg at 03/07/16 1046  . docusate sodium (COLACE) capsule 100 mg  100 mg Oral BID Meredith Pel, MD   100 mg at 03/07/16 1045  . enoxaparin (LOVENOX) injection 30 mg  30 mg Subcutaneous Q24H Meredith Pel, MD   30 mg at 03/07/16  0824  . ferrous sulfate tablet 325 mg  325 mg Oral Q breakfast Meredith Pel, MD   325 mg at 03/07/16 [id number]  . furosemide (LASIX) tablet 80 mg  80 mg Oral Daily Meredith Pel, MD   80 mg at 03/07/16 1044  . hydrALAZINE (APRESOLINE) tablet 50 mg  50 mg Oral TID Meredith Pel, MD   50 mg at 03/07/16 1043  . insulin glargine (LANTUS) injection 20 Units  20 Units Subcutaneous BID Meredith Pel, MD   20 Units at 03/07/16 1047  . isosorbide dinitrate (ISORDIL) tablet 40 mg  40 mg Oral BID Meredith Pel, MD   40 mg at 03/07/16 1046  . Magnesium TABS 200 mg  200 mg Oral q morning - 10a Meredith Pel, MD   200 mg at 03/07/16 1044  . menthol-cetylpyridinium (CEPACOL) lozenge 3 mg  1 lozenge Oral PRN Meredith Pel, MD       Or  . phenol (CHLORASEPTIC) mouth spray 1 spray  1 spray Mouth/Throat PRN Meredith Pel, MD      . metoCLOPramide (REGLAN) tablet 5-10 mg  5-10 mg Oral Q8H PRN Meredith Pel, MD       Or  . metoCLOPramide (REGLAN) injection 5-10 mg  5-10 mg Intravenous Q8H PRN Meredith Pel, MD   10 mg at 03/06/16 1311  . morphine 4 MG/ML injection 4 mg  4 mg Intravenous Q2H PRN Meredith Pel, MD   4 mg at 03/07/16 0824  . ondansetron (ZOFRAN) tablet 4 mg  4 mg Oral Q6H PRN Meredith Pel, MD       Or  . ondansetron [facility]) injection 4 mg  4 mg Intravenous Q6H PRN Meredith Pel, MD   4 mg at 03/06/16 1258  . oxyCODONE (Oxy IR/ROXICODONE) immediate release tablet 5-10 mg  5-10 mg Oral Q3H PRN Meredith Pel, MD   10 mg at 03/07/16 1045     Discharge Medications: Please see discharge summary for a list of discharge medications.  Relevant Imaging Results:  Relevant Lab Results:   Additional Information SSN: [id number]  Dulcy Fanny, LCSW

## 2016-03-07 NOTE — Progress Notes (Addendum)
Physical Therapy Treatment Patient Details Name: Norma [name] MRN: [id number] DOB: [birth year] Today's Date: 03/07/2016    History of Present Illness Pt is 70 yo female who was admitted 8/29 for elective R direct anterior THA.    PT Comments    Pt performed progression to gait training. Pt is progressing well and PT services will continue efforts.  Recommendations remain for SNF placement to improve mobility before return home.    Follow Up Recommendations  SNF;Supervision/Assistance - 24 hour     Equipment Recommendations  None recommended by PT    Recommendations for Other Services       Precautions / Restrictions Precautions Precautions: None Restrictions Weight Bearing Restrictions: Yes RLE Weight Bearing: Weight bearing as tolerated    Mobility  Bed Mobility               General bed mobility comments: Pt received in recliner on arrival.    Transfers Overall transfer level: Needs assistance Equipment used: Rolling walker (2 wheeled) Transfers: Sit to/from Stand Sit to Stand: Min guard Stand pivot transfers: Min guard       General transfer comment: for safety.  Ambulation/Gait Ambulation/Gait assistance: Min guard Ambulation Distance (Feet): 52 Feet Assistive device: Rolling walker (2 wheeled) Gait Pattern/deviations: Step-through pattern;Trunk flexed;Antalgic;Decreased stance time - right;Decreased stride length   Gait velocity interpretation: Below normal speed for age/gender General Gait Details: Cues for forward gaze and gait symmetry.  Cues to off load weight into arms to improve L foot clearance and avoid stomping.     Stairs            Wheelchair Mobility    Modified Rankin (Stroke Patients Only)       Balance Overall balance assessment: Needs assistance   Sitting balance-Leahy Scale: Good       Standing balance-Leahy Scale: Fair                      Cognition Arousal/Alertness: Awake/alert Behavior During  Therapy: WFL for tasks assessed/performed Overall Cognitive Status: Within Functional Limits for tasks assessed                      Exercises Total Joint Exercises Ankle Circles/Pumps: AROM;Both;10 reps;Supine Quad Sets: AROM;Right;10 reps;Supine Short Arc Quad: AROM;Right;10 reps;Supine Heel Slides: Right;10 reps;Supine;AAROM Hip ABduction/ADduction: Right;10 reps;Supine;AAROM    General Comments        Pertinent Vitals/Pain Pain Score: 7  Pain Location: R hip  Pain Descriptors / Indicators: Sore Pain Intervention(s): Monitored during session;Repositioned;Ice applied    Home Living Family/patient expects to be discharged to:: Skilled nursing facility Living Arrangements: Alone             Additional Comments: has all bathroom DME, grab bars and reacher    Prior Function Level of Independence: Independent      Comments: used RW, did grocery shopping, drove   PT Goals (current goals can now be found in the care plan section) Acute Rehab PT Goals Patient Stated Goal: go to rehab Potential to Achieve Goals: Good Progress towards PT goals: Progressing toward goals    Frequency  7X/week    PT Plan Current plan remains appropriate    Co-evaluation             End of Session Equipment Utilized During Treatment: Gait belt Activity Tolerance: Patient tolerated treatment well Patient left: in chair;with call bell/phone within reach;with family/visitor present     Time: [id number] PT Time Calculation (min) (  ACUTE ONLY): 21 min  Charges:  $Gait Training: 8-22 mins                    G Codes:      Cristela Blue March 29, 2016, 5:45 PM Governor Rooks, PTA pager [phone]

## 2016-03-07 NOTE — Evaluation (Signed)
Occupational Therapy Evaluation Patient Details Name: Norma [name] MRN: [id number] DOB: September 20, 1945 Today's Date: 03/07/2016    History of Present Illness Pt is 70 yo female who was admitted 8/29 for elective R direct anterior THA.   Clinical Impression   Pt was admitted for the above sx.  She was independent with adls prior to admission and currently needs up to max A for LB dressing. She will benefit from continued OT in acute with supervision level goals in this setting.    Follow Up Recommendations  SNF    Equipment Recommendations  None recommended by OT    Recommendations for Other Services       Precautions / Restrictions Precautions Precautions: None Restrictions RLE Weight Bearing: Weight bearing as tolerated      Mobility Bed Mobility               General bed mobility comments: oob  Transfers   Equipment used: Rolling walker (2 wheeled)   Sit to Stand: Min guard         General transfer comment: for safety.    Balance                                            ADL Overall ADL's : Needs assistance/impaired     Grooming: Set up;Sitting   Upper Body Bathing: Set up;Sitting   Lower Body Bathing: Moderate assistance;Sit to/from stand   Upper Body Dressing : Set up;Sitting   Lower Body Dressing: Maximal assistance;Sit to/from stand                 General ADL Comments: Pt stood only. Wanted to remain sitting in chair and did not have any toileting needs. She is familiar with AE.  Pt cannot lift leg from floor unassisted; educated on crossing L under R ankle to help lift during adls.       Vision     Perception     Praxis      Pertinent Vitals/Pain Pain Score: 7  Pain Location: R hip Pain Descriptors / Indicators: Sore Pain Intervention(s): Limited activity within patient's tolerance;Monitored during session;Premedicated before session;Repositioned;Ice applied     Hand Dominance Right    Extremity/Trunk Assessment Upper Extremity Assessment Upper Extremity Assessment: Overall WFL for tasks assessed (for adls)           Communication     Cognition Arousal/Alertness: Awake/alert Behavior During Therapy: Flat affect Overall Cognitive Status: Within Functional Limits for tasks assessed                     General Comments       Exercises       Shoulder Instructions      Home Living Family/patient expects to be discharged to:: Skilled nursing facility Living Arrangements: Alone                               Additional Comments: has all bathroom DME, grab bars and reacher      Prior Functioning/Environment Level of Independence: Independent        Comments: used RW, did grocery shopping, drove    OT Diagnosis: Acute pain   OT Problem List: Decreased strength;Decreased activity tolerance;Pain   OT Treatment/Interventions: Self-care/ADL training;DME and/or AE instruction;Patient/family education    OT Goals(Current goals can  be found in the care plan section) Acute Rehab OT Goals Patient Stated Goal: go to rehab OT Goal Formulation: With patient Time For Goal Achievement: 03/14/16 Potential to Achieve Goals: Good ADL Goals Pt Will Perform Grooming: with supervision;standing Pt Will Perform Lower Body Bathing: with supervision;with adaptive equipment;sit to/from stand Pt Will Transfer to Toilet: with supervision;ambulating;bedside commode  OT Frequency: Min 2X/week   Barriers to D/C:            Co-evaluation              End of Session    Activity Tolerance: Patient limited by fatigue Patient left: in chair;with call bell/phone within reach;with family/visitor present   Time: [id number] OT Time Calculation (min): 14 min Charges:  OT General Charges $OT Visit: 1 Procedure OT Evaluation $OT Eval Low Complexity: 1 Procedure G-Codes:    Sheritha Louis 03/31/16, 2:13 PM Lesle Chris,  OTR/L [phone] Mar 31, 2016

## 2016-03-07 NOTE — Clinical Social Work Note (Signed)
Patient has bed available at [facility] once medically stable.  Nonnie Done, LCSW ([id number]) [id number]  Licensed Clinical Social Worker

## 2016-03-07 NOTE — Progress Notes (Signed)
Subjective: Pt stable - has been oob no dizziness   Objective: Vital signs in last 24 hours: Temp:  [97 F (36.1 C)-98.5 F (36.9 C)] 98.5 F (36.9 C) (08/30 0544) Pulse Rate:  [49-63] 63 (08/30 0544) Resp:  [11-18] 18 (08/30 0544) BP: (118-197)/(35-70) 122/50 (08/30 0640) SpO2:  [95 %-100 %] 95 % (08/30 0544)  Intake/Output from previous day: 08/29 0701 - 08/30 0700 In: 3120 [P.O.:720; I.V.:2400] Out: 925 [Urine:725; Blood:200] Intake/Output this shift: No intake/output data recorded.  Exam:  Sensation intact distally  Labs:  Recent Labs  03/06/16 1340 03/07/16 0620  HGB 8.0* 6.9*    Recent Labs  03/06/16 1340 03/07/16 0620  WBC 13.2* 7.8  RBC 2.83* 2.41*  HCT 26.4* 22.5*  PLT 211 162    Recent Labs  03/06/16 1340 03/07/16 0620  NA  --  141  K  --  4.8  CL  --  111  CO2  --  23  BUN  --  39*  CREATININE 1.36* 1.89*  GLUCOSE  --  137*  CALCIUM  --  7.9*   No results for input(s): LABPT, INR in the last 72 hours.  Assessment/Plan: Plan 1 u prbc today - snf Thursday or [place] 03/07/2016, 7:40 AM

## 2016-03-07 NOTE — Clinical Social Work Placement (Signed)
   CLINICAL SOCIAL WORK PLACEMENT  NOTE  Date:  03/07/2016  Patient Details  Name: NIJAH BRETADO MRN: [id number] Date of Birth: 1945/11/11  Clinical Social Work is seeking post-discharge placement for this patient at the [place] level of care (*CSW will initial, date and re-position this form in  chart as items are completed):  Yes   Patient/family provided with [place] Work Department's list of facilities offering this level of care within the geographic area requested by the patient (or if unable, by the patient's family).  Yes   Patient/family informed of their freedom to choose among providers that offer the needed level of care, that participate in Medicare, Medicaid or managed care program needed by the patient, have an available bed and are willing to accept the patient.  Yes   Patient/family informed of [facility]'s ownership interest in [facility] and [facility], as well as of the fact that they are under no obligation to receive care at these facilities.  PASRR submitted to EDS on 03/07/16     PASRR number received on 03/07/16     Existing PASRR number confirmed on       FL2 transmitted to all facilities in geographic area requested by pt/family on 03/07/16     FL2 transmitted to all facilities within larger geographic area on       Patient informed that his/her managed care company has contracts with or will negotiate with certain facilities, including the following:        Yes   Patient/family informed of bed offers received.  Patient chooses bed at       Physician recommends and patient chooses bed at      Patient to be transferred to   on  .  Patient to be transferred to facility by       Patient family notified on   of transfer.  Name of family member notified:        PHYSICIAN Please prepare priority discharge summary, including medications     Additional Comment:     _______________________________________________ Dulcy Fanny, LCSW 03/07/2016, 2:33 PM

## 2016-03-07 NOTE — Op Note (Signed)
NAME:  SHERRILL, MCNALLEY NO.:  [id number]  MEDICAL RECORD NO.:  [id number]  LOCATION:  [id number]                        FACILITY:  [facility] [place]  PHYSICIAN:  Anderson Malta, M.D.    DATE OF BIRTH:  Jan 09, 1946  DATE OF PROCEDURE: DATE OF DISCHARGE:                              OPERATIVE REPORT   PREOPERATIVE DIAGNOSIS:  Right hip arthritis.  POSTOPERATIVE DIAGNOSIS:  Right hip arthritis.  PROCEDURE:  Right hip replacement, Corail size 12 stem, 48 cup, 2 screws, neutral liner, 1.5 standard neck length with ceramic head, 32 mm.  SURGEON:  Anderson Malta, M.D.  ASSISTANT:  Eduard Roux, M.D.  INDICATIONS:  Norma [name] is a 70 year old patient, right hip arthritis, presents for operative management after explanation of risks and benefits.  PROCEDURE IN DETAIL:  The patient was brought to the operating room where general anesthetic was induced.  Preoperative antibiotics were administered.  Time-out was called.  The patient was placed on the [place] bed.  Both legs externally rotated about 40 degrees in the pelvis in neutral and central.  The right hip area was prescrubbed with alcohol and Betadine, allowed to air dry, prepped with DuraPrep solution and draped in sterile manner. Fluoroscopy was used to obtain AP on the right hip as well as AP pelvis. These were used for later comparison for leg length.  At this time, Charlie Pitter was used to cover the operative field.  Time-out was called. Incision was made 2 cm inferior and distal to the anterior superior iliac crest.  The pannus of abdomen was taped out of the way prior to prepping and draping, about 10-cm incision was made.  Skin and subcutaneous tissue were sharply divided.  The plane between the fascia lata and iliotibial band was visualized.  Just superior to that plane, the fascia lata was incised up to the anterior superior iliac crest and distally to leave the incision.  Dissection was performed off the underlying fascia.  A  superior Cobra retractor was placed on the superior femoral neck with two heads in position.  The circumflex vessels were coagulated.  At this time, using Cobb elevator, soft tissue was elevated off the anterior femoral neck and the Cobb was placed around the anterior inferior femoral neck.  Capsulotomy was made and tagged in T-shaped fashion along the intertrochanteric ridge and completed down to the lesser trochanter with the leg in external rotation.  These capsular flaps were tagged, retractors were placed on the inside of the femoral neck.  At this time, using fluoroscopic guidance, the femoral neck cut was made and with mild traction of the foot and 30 degrees external rotation, the acetabulum was prepared.  Two retractors were placed at the 5 and 7 o'clock position.  Labrum was excised.  The fovea was excised.  Inferior capsule was released. Reaming was then performed starting with 43 and ending up on 47.  A 48 cup was placed in about 40 degrees of abduction and about 10 degrees of anteversion.  Good purchase was achieved.  Two screws were placed. Neutral liner was placed.  Attention was then directed toward the femur. The leg was taken down and over and trochanteric retractor was  placed and Muller placed down below the lesser.  Soft tissue releases were performed of the conjoint tendon and the piriformis tendon. Lateralization was performed and the femur was broached up to size 12 with a 1.5-mm neck, standard offset and 32 head.  Hip was reduced, found to be very stable with good leg length.  It had good stability with 40 degrees of extension and 90 degrees of external rotation.  At this time, trial femur was removed.  Thorough irrigation was performed.  True components were placed with same stability parameters maintained.  At this time, thorough irrigation was performed.  Tranexamic acid-soaked sponge placed around the capsule for 3 minutes.  Fascia lata was then closed using #1  Vicryl suture followed by interrupted inverted 0 Vicryl suture, 2-0 Vicryl suture and running 3-0 Monocryl.  Aquacel dressing placed.  The patient tolerated the procedure well without immediate complication, transferred to the recovery room in stable condition.  Dr. Phoebe Sharps assistance was required during the case for retraction and placement.     Anderson Malta, M.D.     GSD/MEDQ  D:  03/06/2016  T:  03/07/2016  Job:  [id number]

## 2016-03-07 NOTE — Progress Notes (Addendum)
Physical Therapy Treatment Patient Details Name: Norma [name] MRN: [id number] DOB: May 21, 1946 Today's Date: 03/07/2016    History of Present Illness Pt is 70 yo female who was admitted 8/29 for elective R direct anterior THA.    PT Comments    Pt performed pm session to improve activity tolerance.  Pt progressed to standing exercise.  Pt short of breath  87%-92% on RA.    Follow Up Recommendations  SNF;Supervision/Assistance - 24 hour     Equipment Recommendations  None recommended by PT    Recommendations for Other Services       Precautions / Restrictions Precautions Precautions: None Restrictions Weight Bearing Restrictions: Yes RLE Weight Bearing: Weight bearing as tolerated    Mobility  Bed Mobility               General bed mobility comments: Pt received in recliner on arrival.    Transfers Overall transfer level: Needs assistance Equipment used: Rolling walker (2 wheeled) Transfers: Sit to/from Stand Sit to Stand: Min guard Stand pivot transfers: Min guard       General transfer comment: for safety.  Ambulation/Gait Ambulation/Gait assistance: Min guard Ambulation Distance (Feet): 72 Feet Assistive device: Rolling walker (2 wheeled) Gait Pattern/deviations: Step-through pattern;Trunk flexed;Antalgic;Decreased stance time - right;Decreased stride length   Gait velocity interpretation: Below normal speed for age/gender General Gait Details: Cues for forward gaze and gait symmetry.  Cues to off load weight into arms to improve L foot clearance and avoid stomping.     Stairs            Wheelchair Mobility    Modified Rankin (Stroke Patients Only)       Balance Overall balance assessment: Needs assistance   Sitting balance-Leahy Scale: Good       Standing balance-Leahy Scale: Fair                      Cognition Arousal/Alertness: Awake/alert Behavior During Therapy: WFL for tasks assessed/performed Overall Cognitive  Status: Within Functional Limits for tasks assessed                      Exercises Total Joint Exercises Heel Slides: Right;10 reps;Supine;AAROM Hip ABduction/ADduction: AROM;Right;10 reps;Standing Knee Flexion: AROM;Right;10 reps;Standing Marching in Standing: AROM;Right;10 reps;Standing Standing Hip Extension: AAROM;Right;10 reps;Standing   General Comments        Pertinent Vitals/Pain Pain Score: 7  Pain Location: R hip  Pain Descriptors / Indicators: Sore Pain Intervention(s): Monitored during session;Repositioned;Ice applied    Home Living Family/patient expects to be discharged to:: Skilled nursing facility Living Arrangements: Alone             Additional Comments: has all bathroom DME, grab bars and reacher    Prior Function Level of Independence: Independent      Comments: used RW, did grocery shopping, drove   PT Goals (current goals can now be found in the care plan section) Acute Rehab PT Goals Patient Stated Goal: go to rehab Potential to Achieve Goals: Good Progress towards PT goals: Progressing toward goals    Frequency  7X/week    PT Plan Current plan remains appropriate    Co-evaluation             End of Session Equipment Utilized During Treatment: Gait belt Activity Tolerance: Patient tolerated treatment well Patient left: in chair;with call bell/phone within reach;with family/visitor present     Time: 1534-1550 PT Time Calculation (min) (ACUTE ONLY): 16 min  Charges:  $Gait Training: 8-22 mins                    G Codes:      Cristela Blue Mar 11, 2016, 5:49 PM Governor Rooks, PTA pager [phone]  Governor Rooks, PTA pager [phone]

## 2016-03-08 LAB — CBC
HEMATOCRIT: 26.6 % — AB (ref 36.0–46.0)
HEMOGLOBIN: 8.2 g/dL — AB (ref 12.0–15.0)
MCH: 28.5 pg (ref 26.0–34.0)
MCHC: 30.8 g/dL (ref 30.0–36.0)
MCV: 92.4 fL (ref 78.0–100.0)
Platelets: 163 10*3/uL (ref 150–400)
RBC: 2.88 MIL/uL — AB (ref 3.87–5.11)
RDW: 16.8 % — ABNORMAL HIGH (ref 11.5–15.5)
WBC: 10.6 10*3/uL — AB (ref 4.0–10.5)

## 2016-03-08 LAB — GLUCOSE, CAPILLARY
GLUCOSE-CAPILLARY: 118 mg/dL — AB (ref 65–99)
GLUCOSE-CAPILLARY: 136 mg/dL — AB (ref 65–99)
Glucose-Capillary: 110 mg/dL — ABNORMAL HIGH (ref 65–99)

## 2016-03-08 MED ORDER — OXYCODONE HCL 5 MG PO TABS: 5.0000 mg | ORAL_TABLET | ORAL | 0 refills | Status: DC | PRN

## 2016-03-08 MED ORDER — ENOXAPARIN SODIUM 30 MG/0.3ML ~~LOC~~ SOLN: 30.0000 mg | SUBCUTANEOUS | 0 refills | Status: DC

## 2016-03-08 NOTE — Progress Notes (Signed)
Patient still unable to void. In and out cath been done twice already with the last one done at 0430am. Patient is drinking, IV nsl. No complaints of bladder discomfort or fullness. Attempted BSC without any results. MD on call paged. Bladder scan showed only 265ml of urine. Order given to insert and leave in foley catheter. Indwelling foley inserted at 2325 with approximately 288ml of urine out in foley bag. Will continue to monitor.

## 2016-03-08 NOTE — Progress Notes (Signed)
CRITICAL VALUE ALERT  Critical value received:  hgb 6.9  Date of notification:  03/07/16  Time of notification:  0730  Critical value read back:Yes.    Nurse who received alert:  Medhansh Brinkmeier  MD notified (1st page):  Marlou Sa  Time of first page:  Present on unit  MD notified (2nd page):  Time of second page:  Responding MD:  Marlou Sa  Time MD responded:  Present on unit

## 2016-03-08 NOTE — Progress Notes (Signed)
Physical Therapy Treatment Patient Details Name: Norma [name] MRN: [id number] DOB: 12/06/1945 Today's Date: 03/08/2016    History of Present Illness Pt is 70 yo female who was admitted 8/29 for elective R direct anterior THA.    PT Comments    Pt performed short gait training distance with fequent rest breaks due to [place] and [facility].  Pt required 2L to improve O2 sats consistently.  Pt presents with decreased safety after becoming SHOB and required education during seated rest breaks to promote improved technique with future gait trials.  Will f/u this pm.    Follow Up Recommendations  SNF;Supervision/Assistance - 24 hour     Equipment Recommendations  None recommended by PT    Recommendations for Other Services       Precautions / Restrictions Precautions Precautions: None Restrictions Weight Bearing Restrictions: Yes RLE Weight Bearing: Weight bearing as tolerated    Mobility  Bed Mobility Overal bed mobility: Needs Assistance Bed Mobility: Supine to Sit     Supine to sit: Min assist;Mod assist     General bed mobility comments: pt required assist for B LE advancement to edge of bed and assist for upper trunk control. Pt used PTA as a rail for upper trunk support.  Once upright patient able to scoot remainder of  way to edge of bed.    Transfers Overall transfer level: Needs assistance Equipment used: Rolling walker (2 wheeled) Transfers: Sit to/from Stand   Stand pivot transfers: Min guard       General transfer comment: for safety.  Cues for hand placement.  Used rocking momentum to assist in standing.    Ambulation/Gait Ambulation/Gait assistance: Min assist Ambulation Distance (Feet): 84 Feet (+12 feet additonally on 2L O2.  ) Assistive device: Rolling walker (2 wheeled) Gait Pattern/deviations: Step-to pattern;Step-through pattern;Decreased stride length;Trunk flexed;Shuffle     General Gait Details: Pt with noticeable DOE during mobility.  O2 sats  decreased to 84% on RA and with pursed lip breathing improved to 88-91% but remains to drop after standing rest break with ambulation.  Pt presents with poor RW safety and required cues to keep RW close to improve posture and safety.     Stairs            Wheelchair Mobility    Modified Rankin (Stroke Patients Only)       Balance Overall balance assessment: Needs assistance   Sitting balance-Leahy Scale: Good       Standing balance-Leahy Scale: Fair                      Cognition Arousal/Alertness: Awake/alert Behavior During Therapy: WFL for tasks assessed/performed Overall Cognitive Status: Within Functional Limits for tasks assessed                      Exercises Total Joint Exercises Ankle Circles/Pumps:  (deferred therapeutic exercise due to DOE post gait training.  )    General Comments        Pertinent Vitals/Pain Pain Assessment: Faces Faces Pain Scale: Hurts little more Pain Location: R hip Pain Descriptors / Indicators: Grimacing;Guarding Pain Intervention(s): Monitored during session;Repositioned    Home Living                      Prior Function            PT Goals (current goals can now be found in the care plan section) Acute Rehab PT Goals Patient  Stated Goal: go to rehab Potential to Achieve Goals: Good Progress towards PT goals: Progressing toward goals    Frequency  7X/week    PT Plan Current plan remains appropriate    Co-evaluation             End of Session Equipment Utilized During Treatment: Gait belt;Oxygen (pulse oximeter.  ) Activity Tolerance: Patient tolerated treatment well Patient left: in chair;with call bell/phone within reach;with family/visitor present     Time: [id number] PT Time Calculation (min) (ACUTE ONLY): 21 min  Charges:  $Gait Training: 8-22 mins                    G Codes:      Cristela Blue 11-Mar-2016, 12:09 PM Governor Rooks, PTA pager [phone]

## 2016-03-08 NOTE — Clinical Social Work Note (Signed)
Clinical Social Work Assessment  Patient Details  Name: Norma [name] MRN: [id number] Date of Birth: 1945/07/19  Date of referral:  03/08/16               Reason for consult:  Facility Placement                Permission sought to share information with:  Case Manager, Chartered certified accountant granted to share information::  Yes, Verbal Permission Granted  Name::        Agency::   ([facility])  Relationship::     Contact Information:     Housing/Transportation Living arrangements for the past 2 months:  Single Family Home Source of Information:  Patient Patient Interpreter Needed:  None Criminal Activity/Legal Involvement Pertinent to Current Situation/Hospitalization:  No - Comment as needed Significant Relationships:  Adult Children Lives with:  Self Do you feel safe going back to the place where you live?  No Need for family participation in patient care:  No (Coment)  Care giving concerns:  No caregivers present at time of discharge   Social Worker assessment / plan:  CSW discussed discharge planning with patient.  PT has evaluated patient and recommended SNF at time of discharge.  Patient states she would like to receive STR at [facility].  Referral was made and SNF is agreeable to providing STR at time of discharge.  Patient states her main support is her daughter.  Of note, patient lives home alone.  Employment status:  Retired Science writer) PT Recommendations:  [place] / Referral to community resources:  [place]  Patient/Family's Response to care:  Patient is agreeable to SNF.  Patient/Family's Understanding of and Emotional Response to Diagnosis, Current Treatment, and Prognosis:  Patient is relieved her discharge plans are working the way she imagined them to work.  CSW offered support and encouragement for the road to recovery  ahead.  Emotional Assessment Appearance:  Appears stated age Attitude/Demeanor/Rapport:    Affect (typically observed):  Accepting, Adaptable Orientation:  Oriented to Self, Oriented to Place, Oriented to  Time, Oriented to Situation Alcohol / Substance use:  Not Applicable Psych involvement (Current and /or in the community):  No (Comment)  Discharge Needs  Concerns to be addressed:  No discharge needs identified Readmission within the last 30 days:  Yes Current discharge risk:  None Barriers to Discharge:  Continued Medical Work up   [employer], LCSW 03/08/2016, 11:20 AM

## 2016-03-08 NOTE — Progress Notes (Signed)
Subjective: Pt stable - no dizziness when walking around -    Objective: Vital signs in last 24 hours: Temp:  [97.9 F (36.6 C)-99.1 F (37.3 C)] 99.1 F (37.3 C) (08/31 0500) Pulse Rate:  [58-71] 71 (08/31 0500) Resp:  [16-18] 18 (08/31 0500) BP: (127-150)/(36-54) 139/54 (08/31 0500) SpO2:  [92 %-100 %] 97 % (08/31 0500)  Intake/Output from previous day: 08/30 0701 - 08/31 0700 In: 644 [P.O.:240; Blood:404] Out: -  Intake/Output this shift: No intake/output data recorded.  Exam:  Intact pulses distally Dorsiflexion/Plantar flexion intact  Labs:  Recent Labs  03/06/16 1340 03/07/16 0620 03/08/16 0239  HGB 8.0* 6.9* 8.2*    Recent Labs  03/07/16 0620 03/08/16 0239  WBC 7.8 10.6*  RBC 2.41* 2.88*  HCT 22.5* 26.6*  PLT 162 163    Recent Labs  03/06/16 1340 03/07/16 0620  NA  --  141  K  --  4.8  CL  --  111  CO2  --  23  BUN  --  39*  CREATININE 1.36* 1.89*  GLUCOSE  --  137*  CALCIUM  --  7.9*   No results for input(s): LABPT, INR in the last 72 hours.  Assessment/Plan: hgb up - acute on chronic blood loss anemia - plan dc to snf am   Evelean Bigler SCOTT 03/08/2016, 8:08 AM

## 2016-03-08 NOTE — Discharge Summary (Signed)
Physician Discharge Summary  Patient ID: Norma [name] MRN: [id number] DOB/AGE: 1945-11-04 70 y.o.  Admit date: 03/06/2016 Discharge date: 03/09/2016 Admission Diagnoses:  Active Problems:   Degenerative arthritis of hip   Discharge Diagnoses:  Same  Surgeries: Procedure(s): RIGHT TOTAL HIP ARTHROPLASTY ANTERIOR APPROACH on 03/06/2016   Consultants:   Discharged Condition: Stable  Hospital Course: Resume is a 70 year old female admitted with right hip pain.  She underwent right total hip replacement on 03/06/2016.  She tolerated procedure well.  She was mobilized with physical therapy weightbearing as tolerated on the right extremity.  She is started on Lovenox for DVT prophylaxis.  She received 1 unit packed red blood cell transfusion for hemoglobin 6.9 on postoperative day #2.  Her beginning hemoglobin was 9.0.  The patient had no episodes of symptomatic anemia or dizziness while she was rehabilitating.  She is discharged to skilled nursing facility in good condition.  She will follow-up with me in 10 days for dressing removal.  Okay for weightbearing as tolerated.  She will continue on Lovenox for 2 weeks postop for DVT prophylaxis Anti-infectives    Start     Dose/Rate Route Frequency Ordered Stop   03/06/16 1200  ceFAZolin (ANCEF) IVPB 2g/100 mL premix     2 g 200 mL/hr over 30 Minutes Intravenous Every 6 hours 03/06/16 1146 03/06/16 1804   03/06/16 0700  ceFAZolin (ANCEF) IVPB 2g/100 mL premix     2 g 200 mL/hr over 30 Minutes Intravenous To [facility]Stay [facility] 03/05/16 1254 03/06/16 0748    .  Recent vital signs:  Vitals:   03/08/16 0500 03/08/16 1253  BP: (!) 139/54 (!) 120/44  Pulse: 71 (!) 58  Resp: 18 18  Temp: 99.1 F (37.3 C) 98.9 F (37.2 C)    Recent laboratory studies:  Results for orders placed or performed during the hospital encounter of 03/06/16  Glucose, capillary  Result Value Ref Range   Glucose-Capillary 51 (L) 65 - 99 mg/dL   Comment 1 Notify  RN   Glucose, capillary  Result Value Ref Range   Glucose-Capillary 110 (H) 65 - 99 mg/dL  Glucose, capillary  Result Value Ref Range   Glucose-Capillary 75 65 - 99 mg/dL  Glucose, capillary  Result Value Ref Range   Glucose-Capillary 95 65 - 99 mg/dL  Glucose, capillary  Result Value Ref Range   Glucose-Capillary 109 (H) 65 - 99 mg/dL   Comment 1 Notify RN   CBC  Result Value Ref Range   WBC 13.2 (H) 4.0 - 10.5 K/uL   RBC 2.83 (L) 3.87 - 5.11 MIL/uL   Hemoglobin 8.0 (L) 12.0 - 15.0 g/dL   HCT 26.4 (L) 36.0 - 46.0 %   MCV 93.3 78.0 - 100.0 fL   MCH 28.3 26.0 - 34.0 pg   MCHC 30.3 30.0 - 36.0 g/dL   RDW 16.0 (H) 11.5 - 15.5 %   Platelets 211 150 - 400 K/uL  Creatinine, serum  Result Value Ref Range   Creatinine, Ser 1.36 (H) 0.44 - 1.00 mg/dL   GFR calc non Af Amer 38 (L) >60 mL/min   GFR calc Af Amer 45 (L) >60 mL/min  Glucose, capillary  Result Value Ref Range   Glucose-Capillary 137 (H) 65 - 99 mg/dL  CBC  Result Value Ref Range   WBC 7.8 4.0 - 10.5 K/uL   RBC 2.41 (L) 3.87 - 5.11 MIL/uL   Hemoglobin 6.9 (LL) 12.0 - 15.0 g/dL   HCT 22.5 (L) 36.0 -  46.0 %   MCV 93.4 78.0 - 100.0 fL   MCH 28.6 26.0 - 34.0 pg   MCHC 30.7 30.0 - 36.0 g/dL   RDW 16.1 (H) 11.5 - 15.5 %   Platelets 162 150 - 400 K/uL  Basic metabolic panel  Result Value Ref Range   Sodium 141 135 - 145 mmol/L   Potassium 4.8 3.5 - 5.1 mmol/L   Chloride 111 101 - 111 mmol/L   CO2 23 22 - 32 mmol/L   Glucose, Bld 137 (H) 65 - 99 mg/dL   BUN 39 (H) 6 - 20 mg/dL   Creatinine, Ser 1.89 (H) 0.44 - 1.00 mg/dL   Calcium 7.9 (L) 8.9 - 10.3 mg/dL   GFR calc non Af Amer 26 (L) >60 mL/min   GFR calc Af Amer 30 (L) >60 mL/min   Anion gap 7 5 - 15  Glucose, capillary  Result Value Ref Range   Glucose-Capillary 178 (H) 65 - 99 mg/dL  Glucose, capillary  Result Value Ref Range   Glucose-Capillary 148 (H) 65 - 99 mg/dL   Comment 1 Notify RN   Glucose, capillary  Result Value Ref Range   Glucose-Capillary  199 (H) 65 - 99 mg/dL  CBC  Result Value Ref Range   WBC 10.6 (H) 4.0 - 10.5 K/uL   RBC 2.88 (L) 3.87 - 5.11 MIL/uL   Hemoglobin 8.2 (L) 12.0 - 15.0 g/dL   HCT 26.6 (L) 36.0 - 46.0 %   MCV 92.4 78.0 - 100.0 fL   MCH 28.5 26.0 - 34.0 pg   MCHC 30.8 30.0 - 36.0 g/dL   RDW 16.8 (H) 11.5 - 15.5 %   Platelets 163 150 - 400 K/uL  Glucose, capillary  Result Value Ref Range   Glucose-Capillary 150 (H) 65 - 99 mg/dL   Comment 1 Notify RN   Glucose, capillary  Result Value Ref Range   Glucose-Capillary 173 (H) 65 - 99 mg/dL  Glucose, capillary  Result Value Ref Range   Glucose-Capillary 110 (H) 65 - 99 mg/dL   Comment 1 Notify RN   Glucose, capillary  Result Value Ref Range   Glucose-Capillary 118 (H) 65 - 99 mg/dL   Comment 1 Notify RN   Prepare RBC  Result Value Ref Range   Order Confirmation      ORDER PROCESSED BY BLOOD BANK BLOOD ALREADY AVAILABLE    Discharge Medications:     Medication List    STOP taking these medications   acetaminophen 500 MG tablet Commonly known as:  TYLENOL   allopurinol 100 MG tablet Commonly known as:  ZYLOPRIM   aspirin 81 MG tablet   HYDROcodone-acetaminophen 10-325 MG tablet Commonly known as:  NORCO   losartan 100 MG tablet Commonly known as:  COZAAR     TAKE these medications   atorvastatin 80 MG tablet Commonly known as:  LIPITOR Take 1 tablet (80 mg total) by mouth daily. What changed:  when to take this   bisacodyl 10 MG suppository Commonly known as:  DULCOLAX Place 1 suppository (10 mg total) rectally daily as needed for moderate constipation.   carvedilol 12.5 MG tablet Commonly known as:  COREG Take 1 tablet (12.5 mg total) by mouth 2 (two) times daily.   cholecalciferol 1000 units tablet Commonly known as:  VITAMIN D Take 1,000 Units by mouth daily as needed (supplement).   cloNIDine 0.3 MG tablet Commonly known as:  CATAPRES Take 0.3 mg by mouth 2 (two) times daily.  Coenzyme Q10 200 MG capsule Take 200  mg by mouth every morning.   docusate sodium 100 MG capsule Commonly known as:  COLACE Take 1 capsule (100 mg total) by mouth 2 (two) times daily.   enoxaparin 30 MG/0.3ML injection Commonly known as:  LOVENOX Inject 0.3 mLs (30 mg total) into the skin daily.   ferrous sulfate 325 (65 FE) MG tablet Take 325 mg by mouth daily with breakfast.   furosemide 80 MG tablet Commonly known as:  LASIX Take 80 mg by mouth daily.   glucose blood test strip 1.8 each by Other route as needed for other. Use as instructed   hydrALAZINE 50 MG tablet Commonly known as:  APRESOLINE Take 50 mg by mouth 3 (three) times daily.   insulin glargine 100 UNIT/ML injection Commonly known as:  LANTUS Inject 0.26 mLs (26 Units total) into the skin 2 (two) times daily. What changed:  how much to take  additional instructions   isosorbide dinitrate 20 MG tablet Commonly known as:  ISORDIL Take 40 mg by mouth 2 (two) times daily.   Magnesium 250 MG Tabs Take 250 mg by mouth every morning.   oxyCODONE 5 MG immediate release tablet Commonly known as:  Oxy IR/ROXICODONE Take 1-2 tablets (5-10 mg total) by mouth every 3 (three) hours as needed for breakthrough pain.   PROBIOTIC DAILY PO Take 1 tablet by mouth daily as needed (supplement).   VICTOZA Guin Inject 1.8 Units into the skin every morning.   vitamin B-12 500 MCG tablet Commonly known as:  CYANOCOBALAMIN Take 500 mcg by mouth every morning.       Diagnostic Studies: Dg Chest 2 View  Result Date: 02/27/2016 CLINICAL DATA:  Pre-admission hip replacement. EXAM: CHEST  2 VIEW COMPARISON:  Chest x-ray dated 03/02/2014. FINDINGS: Mild cardiomegaly is stable. Median sternotomy wires appear intact and stable in alignment. Atherosclerotic changes again noted at the aortic arch. Lungs are clear. Slight elevation of the right hemidiaphragm is stable. IMPRESSION: No active cardiopulmonary disease. Stable mild cardiomegaly. Aortic atherosclerosis.  Electronically Signed   By: Franki Cabot M.D.   On: 02/27/2016 13:46   Dg C-arm 61-120 Min  Result Date: 03/06/2016 CLINICAL DATA:  RIGHT hip replacement EXAM: OPERATIVE RIGHT HIP (WITH PELVIS IF PERFORMED) 2 VIEWS TECHNIQUE: Fluoroscopic spot image(s) were submitted for interpretation post-operatively. COMPARISON:  None. FLUOROSCOPY TIME:  1 minutes 9 seconds Images obtained:  2 FINDINGS: RIGHT hip prosthesis identified. Bones demineralized. No fracture dislocation seen. IMPRESSION: RIGHT hip prosthesis without acute complication Electronically Signed   By: Lavonia Dana M.D.   On: 03/06/2016 10:31   Dg Hip Operative Unilat W Or W/o Pelvis Right  Result Date: 03/06/2016 CLINICAL DATA:  RIGHT hip replacement EXAM: OPERATIVE RIGHT HIP (WITH PELVIS IF PERFORMED) 2 VIEWS TECHNIQUE: Fluoroscopic spot image(s) were submitted for interpretation post-operatively. COMPARISON:  None. FLUOROSCOPY TIME:  1 minutes 9 seconds Images obtained:  2 FINDINGS: RIGHT hip prosthesis identified. Bones demineralized. No fracture dislocation seen. IMPRESSION: RIGHT hip prosthesis without acute complication Electronically Signed   By: Lavonia Dana M.D.   On: 03/06/2016 10:31    Disposition: ED Dismiss - Never Arrived  Discharge Instructions    Call MD / Call 911    Complete by:  As directed   If you experience chest pain or shortness of breath, CALL 911 and be transported to the hospital emergency room.  If you develope a fever above 101 F, pus (white drainage) or increased drainage or redness at  the wound, or calf pain, call your surgeon's office.   Constipation Prevention    Complete by:  As directed   Drink plenty of fluids.  Prune juice may be helpful.  You may use a stool softener, such as Colace (over the counter) 100 mg twice a day.  Use MiraLax (over the counter) for constipation as needed.   Diet - low sodium heart healthy    Complete by:  As directed   Discharge instructions    Complete by:  As directed    Weightbearing as tolerated with crutches Keep incision dry Okay to remove dressing on Wednesday of next week   Increase activity slowly as tolerated    Complete by:  As directed      Contact information for after-discharge care    [facility] .   Specialty:  [place] information: [address] [place] [address] [phone]               Signed: Meredith Pel 03/08/2016, 5:44 PM

## 2016-03-08 NOTE — Progress Notes (Signed)
SATURATION QUALIFICATIONS: (This note is used to comply with regulatory documentation for home oxygen)  Patient Saturations on Room Air at Rest = 91%  Patient Saturations on Room Air while Ambulating = 84%  Patient Saturations on 2 Liters of oxygen while Ambulating =94%  Please briefly explain why patient needs home oxygen: pt desaturated with RA during ambulation.  Required 2L O2 to maintain above 90% during ambulation.    Governor Rooks, PTA pager [phone]

## 2016-03-09 LAB — GLUCOSE, CAPILLARY: GLUCOSE-CAPILLARY: 107 mg/dL — AB (ref 65–99)

## 2016-03-09 NOTE — Progress Notes (Signed)
Discharge instructions, follow up appts and RX's explained and provided to patient verbalized understanding. Patient transported to [facility] by family. Patient left floor via wheelchair accompanied by staff.  Jorden Minchey, Tivis Ringer, RN

## 2016-03-09 NOTE — Progress Notes (Signed)
Subjective: Patient stable vital signs stable no dizziness when ambulating   Objective: Vital signs in last 24 hours: Temp:  [98.5 F (36.9 C)-98.9 F (37.2 C)] 98.8 F (37.1 C) (09/01 0449) Pulse Rate:  [56-62] 62 (09/01 0449) Resp:  [16-18] 16 (09/01 0449) BP: (120-137)/(42-50) 133/45 (09/01 0449) SpO2:  [93 %-95 %] 95 % (09/01 0449) Weight:  [94.3 kg (208 lb)] 94.3 kg (208 lb) (08/31 1700)  Intake/Output from previous day: 08/31 0701 - 09/01 0700 In: 840 [P.O.:840] Out: 450 [Urine:450] Intake/Output this shift: No intake/output data recorded.  Exam:  Sensation intact distally Intact pulses distally  Labs:  Recent Labs  03/06/16 1340 03/07/16 0620 03/08/16 0239  HGB 8.0* 6.9* 8.2*    Recent Labs  03/07/16 0620 03/08/16 0239  WBC 7.8 10.6*  RBC 2.41* 2.88*  HCT 22.5* 26.6*  PLT 162 163    Recent Labs  03/06/16 1340 03/07/16 0620  NA  --  141  K  --  4.8  CL  --  111  CO2  --  23  BUN  --  39*  CREATININE 1.36* 1.89*  GLUCOSE  --  137*  CALCIUM  --  7.9*   No results for input(s): LABPT, INR in the last 72 hours.  Assessment/Plan: Plan discharge today.  Okay to change dressing on incision late next week. Continue weightbearing as tolerated.Norma [name] 03/09/2016, 7:18 AM

## 2016-03-09 NOTE — Progress Notes (Signed)
Physical Therapy Treatment Patient Details Name: Norma [name] MRN: [id number] DOB: February 19, 1946 Today's Date: 03/09/2016    History of Present Illness Pt is 70 yo female who was admitted 8/29 for elective R direct anterior THA.    PT Comments    Pt limited progress due to DOE and need for seated rest break.  Pt would continue to benefit from Short Term SNF placement to improve endurance and strength before returning home.    Follow Up Recommendations  SNF;Supervision/Assistance - 24 hour     Equipment Recommendations  None recommended by PT    Recommendations for Other Services       Precautions / Restrictions Precautions Precautions: None Restrictions Weight Bearing Restrictions: Yes RLE Weight Bearing: Weight bearing as tolerated    Mobility  Bed Mobility               General bed mobility comments: Pt received in recliner chair on arrival.    Transfers Overall transfer level: Needs assistance Equipment used: Rolling walker (2 wheeled) Transfers: Sit to/from Stand Sit to Stand: Min guard;Mod assist (assist level varried as patient began to fatigue.  ) Stand pivot transfers: Min guard       General transfer comment: Pt required cues for hand placement.  Rocking momentum used from chair, patient required boost from sit to stand from edge of bed.    Ambulation/Gait Ambulation/Gait assistance: Min assist Ambulation Distance (Feet): 38 Feet (+16 feet.  ) Assistive device: Rolling walker (2 wheeled) Gait Pattern/deviations: Step-to pattern;Step-through pattern;Shuffle;Trunk flexed   Gait velocity interpretation: <1.8 ft/sec, indicative of risk for recurrent falls General Gait Details: Pt O2 sats 91%-94% on RA.  Pt c/o SHOB and required seated rest on edge of bed after returning back to room.  Pt limits progression due to fatigue and DOE.  Cues for safety to keep hands on RW for safety.  Pt has tendency to lean over RW for support.     Stairs             Wheelchair Mobility    Modified Rankin (Stroke Patients Only)       Balance Overall balance assessment: Needs assistance   Sitting balance-Leahy Scale: Good       Standing balance-Leahy Scale: Poor                      Cognition Arousal/Alertness: Awake/alert Behavior During Therapy: WFL for tasks assessed/performed Overall Cognitive Status: Within Functional Limits for tasks assessed                      Exercises      General Comments        Pertinent Vitals/Pain Pain Assessment: 0-10 Pain Score: 7  Pain Location: R hip  Pain Descriptors / Indicators: Grimacing;Guarding Pain Intervention(s): Monitored during session;Repositioned    Home Living                      Prior Function            PT Goals (current goals can now be found in the care plan section) Acute Rehab PT Goals Patient Stated Goal: go to rehab Potential to Achieve Goals: Good Progress towards PT goals: Progressing toward goals    Frequency  7X/week    PT Plan Current plan remains appropriate    Co-evaluation             End of Session Equipment Utilized During Treatment: Gait  belt Activity Tolerance: Patient tolerated treatment well Patient left: in chair;with call bell/phone within reach;with family/visitor present     Time: [id number] PT Time Calculation (min) (ACUTE ONLY): 17 min  Charges:  $Gait Training: 8-22 mins                    G Codes:      Norma [name] 2016/03/26, 1:45 PM  Norma [name], PTA pager [phone]

## 2016-03-09 NOTE — Care Management Important Message (Signed)
Important Message  Patient Details  Name: Norma [name] MRN: [id number] Date of Birth: Aug 02, 1945   Medicare Important Message Given:  Yes    Orbie Pyo 03/09/2016, 10:44 AM

## 2016-03-11 LAB — TYPE AND SCREEN
ABO/RH(D): A POS
ANTIBODY SCREEN: NEGATIVE
UNIT DIVISION: 0
Unit division: 0

## 2016-04-10 ENCOUNTER — Telehealth: Payer: Self-pay | Admitting: Internal Medicine

## 2016-04-10 NOTE — Telephone Encounter (Signed)
10/04 appointment canceled per patient request. Patient is not available will call later in the week to reschedule.

## 2016-04-11 ENCOUNTER — Other Ambulatory Visit: Payer: Medicare Other

## 2016-04-12 ENCOUNTER — Ambulatory Visit (INDEPENDENT_AMBULATORY_CARE_PROVIDER_SITE_OTHER): Payer: Medicare Other | Admitting: Orthopedic Surgery

## 2016-04-12 DIAGNOSIS — Z96641 Presence of right artificial hip joint: Secondary | ICD-10-CM

## 2016-04-16 ENCOUNTER — Telehealth: Payer: Self-pay | Admitting: Internal Medicine

## 2016-04-16 NOTE — Telephone Encounter (Signed)
10/11 Appointment canceled per patient request. The patient will call back to reschedule.

## 2016-04-18 ENCOUNTER — Ambulatory Visit (INDEPENDENT_AMBULATORY_CARE_PROVIDER_SITE_OTHER): Payer: Medicare Other | Admitting: Orthopedic Surgery

## 2016-04-18 ENCOUNTER — Other Ambulatory Visit: Payer: Self-pay | Admitting: Orthopedic Surgery

## 2016-04-18 ENCOUNTER — Ambulatory Visit: Payer: Medicare Other | Admitting: Internal Medicine

## 2016-04-18 DIAGNOSIS — Z96641 Presence of right artificial hip joint: Secondary | ICD-10-CM

## 2016-04-19 ENCOUNTER — Encounter (HOSPITAL_COMMUNITY): Payer: Self-pay | Admitting: *Deleted

## 2016-04-19 NOTE — Progress Notes (Addendum)
Spoke with patient...obtained mdical & surgical history.  Went over what meds to take in the morning, etc.  Arrival time is 12:30pm

## 2016-04-20 ENCOUNTER — Inpatient Hospital Stay (HOSPITAL_COMMUNITY)
Admission: RE | Admit: 2016-04-20 | Discharge: 2016-04-26 | DRG: 465 | Disposition: A | Discharge status: Home Health | Payer: Medicare Other | Source: Ambulatory Visit | Attending: Orthopedic Surgery | Admitting: Orthopedic Surgery

## 2016-04-20 ENCOUNTER — Encounter (HOSPITAL_COMMUNITY)
Admission: RE | Disposition: A | Discharge status: Home Health | Payer: Self-pay | Source: Ambulatory Visit | Attending: Orthopedic Surgery

## 2016-04-20 ENCOUNTER — Inpatient Hospital Stay (HOSPITAL_COMMUNITY): Payer: Medicare Other | Admitting: Certified Registered Nurse Anesthetist

## 2016-04-20 ENCOUNTER — Encounter (HOSPITAL_COMMUNITY): Payer: Self-pay | Admitting: Urology

## 2016-04-20 DIAGNOSIS — Z87891 Personal history of nicotine dependence: Secondary | ICD-10-CM

## 2016-04-20 DIAGNOSIS — B9620 Unspecified Escherichia coli [E. coli] as the cause of diseases classified elsewhere: Secondary | ICD-10-CM | POA: Diagnosis present

## 2016-04-20 DIAGNOSIS — Z6839 Body mass index (BMI) 39.0-39.9, adult: Secondary | ICD-10-CM | POA: Diagnosis not present

## 2016-04-20 DIAGNOSIS — Z951 Presence of aortocoronary bypass graft: Secondary | ICD-10-CM

## 2016-04-20 DIAGNOSIS — Z8249 Family history of ischemic heart disease and other diseases of the circulatory system: Secondary | ICD-10-CM

## 2016-04-20 DIAGNOSIS — E1122 Type 2 diabetes mellitus with diabetic chronic kidney disease: Secondary | ICD-10-CM | POA: Diagnosis present

## 2016-04-20 DIAGNOSIS — L02416 Cutaneous abscess of left lower limb: Secondary | ICD-10-CM

## 2016-04-20 DIAGNOSIS — I129 Hypertensive chronic kidney disease with stage 1 through stage 4 chronic kidney disease, or unspecified chronic kidney disease: Secondary | ICD-10-CM | POA: Diagnosis present

## 2016-04-20 DIAGNOSIS — I2510 Atherosclerotic heart disease of native coronary artery without angina pectoris: Secondary | ICD-10-CM | POA: Diagnosis present

## 2016-04-20 DIAGNOSIS — Z881 Allergy status to other antibiotic agents status: Secondary | ICD-10-CM

## 2016-04-20 DIAGNOSIS — E785 Hyperlipidemia, unspecified: Secondary | ICD-10-CM | POA: Diagnosis present

## 2016-04-20 DIAGNOSIS — T8451XA Infection and inflammatory reaction due to internal right hip prosthesis, initial encounter: Secondary | ICD-10-CM | POA: Diagnosis present

## 2016-04-20 DIAGNOSIS — Z91041 Radiographic dye allergy status: Secondary | ICD-10-CM | POA: Diagnosis not present

## 2016-04-20 DIAGNOSIS — T8459XA Infection and inflammatory reaction due to other internal joint prosthesis, initial encounter: Secondary | ICD-10-CM

## 2016-04-20 DIAGNOSIS — E1151 Type 2 diabetes mellitus with diabetic peripheral angiopathy without gangrene: Secondary | ICD-10-CM | POA: Diagnosis present

## 2016-04-20 DIAGNOSIS — R2689 Other abnormalities of gait and mobility: Secondary | ICD-10-CM

## 2016-04-20 DIAGNOSIS — F419 Anxiety disorder, unspecified: Secondary | ICD-10-CM | POA: Diagnosis present

## 2016-04-20 DIAGNOSIS — Y831 Surgical operation with implant of artificial internal device as the cause of abnormal reaction of the patient, or of later complication, without mention of misadventure at the time of the procedure: Secondary | ICD-10-CM | POA: Diagnosis present

## 2016-04-20 DIAGNOSIS — E669 Obesity, unspecified: Secondary | ICD-10-CM | POA: Diagnosis present

## 2016-04-20 DIAGNOSIS — T8149XA Infection following a procedure, other surgical site, initial encounter: Secondary | ICD-10-CM | POA: Diagnosis present

## 2016-04-20 DIAGNOSIS — M109 Gout, unspecified: Secondary | ICD-10-CM | POA: Diagnosis present

## 2016-04-20 DIAGNOSIS — Z96649 Presence of unspecified artificial hip joint: Secondary | ICD-10-CM

## 2016-04-20 DIAGNOSIS — N183 Chronic kidney disease, stage 3 (moderate): Secondary | ICD-10-CM | POA: Diagnosis present

## 2016-04-20 HISTORY — PX: INCISION AND DRAINAGE HIP: SHX1801

## 2016-04-20 LAB — BASIC METABOLIC PANEL
Anion gap: 7 (ref 5–15)
BUN: 27 mg/dL — AB (ref 6–20)
CHLORIDE: 115 mmol/L — AB (ref 101–111)
CO2: 21 mmol/L — ABNORMAL LOW (ref 22–32)
CREATININE: 1.46 mg/dL — AB (ref 0.44–1.00)
Calcium: 8.7 mg/dL — ABNORMAL LOW (ref 8.9–10.3)
GFR calc Af Amer: 41 mL/min — ABNORMAL LOW (ref >60)
GFR calc non Af Amer: 35 mL/min — ABNORMAL LOW (ref >60)
Glucose, Bld: 109 mg/dL — ABNORMAL HIGH (ref 65–99)
Potassium: 4.3 mmol/L (ref 3.5–5.1)
SODIUM: 143 mmol/L (ref 135–145)

## 2016-04-20 LAB — CBC
HCT: 25.7 % — ABNORMAL LOW (ref 36.0–46.0)
Hemoglobin: 7.8 g/dL — ABNORMAL LOW (ref 12.0–15.0)
MCH: 27.3 pg (ref 26.0–34.0)
MCHC: 30.4 g/dL (ref 30.0–36.0)
MCV: 89.9 fL (ref 78.0–100.0)
PLATELETS: 207 10*3/uL (ref 150–400)
RBC: 2.86 MIL/uL — ABNORMAL LOW (ref 3.87–5.11)
RDW: 16.4 % — AB (ref 11.5–15.5)
WBC: 6.5 10*3/uL (ref 4.0–10.5)

## 2016-04-20 LAB — GLUCOSE, CAPILLARY
GLUCOSE-CAPILLARY: 143 mg/dL — AB (ref 65–99)
Glucose-Capillary: 118 mg/dL — ABNORMAL HIGH (ref 65–99)
Glucose-Capillary: 98 mg/dL (ref 65–99)
Glucose-Capillary: 152 mg/dL — ABNORMAL HIGH (ref 65–99)

## 2016-04-20 SURGERY — IRRIGATION AND DEBRIDEMENT HIP WITH POLY EXCHANGE
Anesthesia: General | Site: Hip | Laterality: Right

## 2016-04-20 MED ORDER — ISOSORBIDE DINITRATE 20 MG PO TABS
40.0000 mg | ORAL_TABLET | Freq: Two times a day (BID) | ORAL | Status: DC
  Administered 2016-04-20 – 2016-04-26 (×11): 40 mg via ORAL
  Filled 2016-04-20 (×12): qty 2

## 2016-04-20 MED ORDER — 0.9 % SODIUM CHLORIDE (POUR BTL) OPTIME
TOPICAL | Status: DC | PRN
  Administered 2016-04-20: 1000 mL

## 2016-04-20 MED ORDER — SODIUM CHLORIDE 0.9 % IV SOLN
INTRAVENOUS | Status: DC
  Administered 2016-04-20 (×3): via INTRAVENOUS

## 2016-04-20 MED ORDER — ROCURONIUM BROMIDE 100 MG/10ML IV SOLN
INTRAVENOUS | Status: DC | PRN
  Administered 2016-04-20: 50 mg via INTRAVENOUS

## 2016-04-20 MED ORDER — ONDANSETRON HCL 4 MG PO TABS: 4.0000 mg | ORAL_TABLET | Freq: Four times a day (QID) | ORAL | Status: DC | PRN

## 2016-04-20 MED ORDER — VANCOMYCIN HCL 10 G IV SOLR
1250.0000 mg | INTRAVENOUS | Status: DC
  Administered 2016-04-21 – 2016-04-23 (×3): 1250 mg via INTRAVENOUS
  Filled 2016-04-20 (×3): qty 1250

## 2016-04-20 MED ORDER — GENTAMICIN SULFATE 40 MG/ML IJ SOLN
INTRAMUSCULAR | Status: AC
Start: 2016-04-20 — End: 2016-04-20
  Filled 2016-04-20: qty 12

## 2016-04-20 MED ORDER — MIDAZOLAM HCL 2 MG/2ML IJ SOLN
INTRAMUSCULAR | Status: AC
  Filled 2016-04-20: qty 2

## 2016-04-20 MED ORDER — CLONIDINE HCL 0.3 MG PO TABS
0.3000 mg | ORAL_TABLET | Freq: Once | ORAL | Status: AC
  Administered 2016-04-20: 0.3 mg via ORAL
  Filled 2016-04-20 (×2): qty 1

## 2016-04-20 MED ORDER — VANCOMYCIN HCL 1000 MG IV SOLR
INTRAVENOUS | Status: DC | PRN
  Administered 2016-04-20: 2000 mg

## 2016-04-20 MED ORDER — VITAMIN D 1000 UNITS PO TABS: 1000.0000 [IU] | ORAL_TABLET | Freq: Every day | ORAL | Status: DC | PRN

## 2016-04-20 MED ORDER — LOSARTAN POTASSIUM 50 MG PO TABS
100.0000 mg | ORAL_TABLET | Freq: Every day | ORAL | Status: DC
  Administered 2016-04-20 – 2016-04-26 (×6): 100 mg via ORAL
  Filled 2016-04-20 (×6): qty 2

## 2016-04-20 MED ORDER — COENZYME Q10 200 MG PO CAPS: 200.0000 mg | ORAL_CAPSULE | Freq: Every morning | ORAL | Status: DC

## 2016-04-20 MED ORDER — CHLORTHALIDONE 25 MG PO TABS
25.0000 mg | ORAL_TABLET | ORAL | Status: DC
  Administered 2016-04-21 – 2016-04-26 (×5): 25 mg via ORAL
  Filled 2016-04-20 (×6): qty 1

## 2016-04-20 MED ORDER — CLONIDINE HCL 0.2 MG PO TABS
0.3000 mg | ORAL_TABLET | Freq: Two times a day (BID) | ORAL | Status: DC
  Administered 2016-04-20 – 2016-04-26 (×11): 0.3 mg via ORAL
  Filled 2016-04-20 (×13): qty 1

## 2016-04-20 MED ORDER — CHLORHEXIDINE GLUCONATE 4 % EX LIQD: 60.0000 mL | Freq: Once | CUTANEOUS | Status: DC

## 2016-04-20 MED ORDER — ONDANSETRON HCL 4 MG/2ML IJ SOLN
INTRAMUSCULAR | Status: DC | PRN
  Administered 2016-04-20: 4 mg via INTRAVENOUS

## 2016-04-20 MED ORDER — VANCOMYCIN HCL 1000 MG IV SOLR
INTRAVENOUS | Status: DC | PRN
  Administered 2016-04-20: 1000 mg via INTRAVENOUS

## 2016-04-20 MED ORDER — FENTANYL CITRATE (PF) 100 MCG/2ML IJ SOLN
INTRAMUSCULAR | Status: AC
  Filled 2016-04-20: qty 4

## 2016-04-20 MED ORDER — LIRAGLUTIDE 18 MG/3ML ~~LOC~~ SOPN: 1.8000 mg | PEN_INJECTOR | SUBCUTANEOUS | Status: DC

## 2016-04-20 MED ORDER — FENTANYL CITRATE (PF) 100 MCG/2ML IJ SOLN
INTRAMUSCULAR | Status: DC | PRN
  Administered 2016-04-20: 50 ug via INTRAVENOUS
  Administered 2016-04-20: 100 ug via INTRAVENOUS
  Administered 2016-04-20: 50 ug via INTRAVENOUS

## 2016-04-20 MED ORDER — PROPOFOL 10 MG/ML IV BOLUS
INTRAVENOUS | Status: DC | PRN
  Administered 2016-04-20: 100 mg via INTRAVENOUS

## 2016-04-20 MED ORDER — BISACODYL 10 MG RE SUPP: 10.0000 mg | Freq: Every day | RECTAL | Status: DC | PRN

## 2016-04-20 MED ORDER — HYDRALAZINE HCL 50 MG PO TABS
50.0000 mg | ORAL_TABLET | Freq: Three times a day (TID) | ORAL | Status: DC
  Administered 2016-04-20 – 2016-04-26 (×14): 50 mg via ORAL
  Filled 2016-04-20 (×15): qty 1

## 2016-04-20 MED ORDER — ACETAMINOPHEN 325 MG PO TABS: 650.0000 mg | ORAL_TABLET | Freq: Four times a day (QID) | ORAL | Status: DC | PRN

## 2016-04-20 MED ORDER — ASPIRIN EC 325 MG PO TBEC
325.0000 mg | DELAYED_RELEASE_TABLET | Freq: Every day | ORAL | Status: DC
  Administered 2016-04-21 – 2016-04-26 (×6): 325 mg via ORAL
  Filled 2016-04-20 (×6): qty 1

## 2016-04-20 MED ORDER — ATORVASTATIN CALCIUM 80 MG PO TABS
80.0000 mg | ORAL_TABLET | Freq: Every day | ORAL | Status: DC
  Administered 2016-04-20 – 2016-04-25 (×4): 80 mg via ORAL
  Filled 2016-04-20 (×5): qty 1

## 2016-04-20 MED ORDER — METOCLOPRAMIDE HCL 5 MG PO TABS: 5.0000 mg | ORAL_TABLET | Freq: Three times a day (TID) | ORAL | Status: DC | PRN

## 2016-04-20 MED ORDER — FENTANYL CITRATE (PF) 100 MCG/2ML IJ SOLN
25.0000 ug | INTRAMUSCULAR | Status: DC | PRN
  Administered 2016-04-20: 50 ug via INTRAVENOUS

## 2016-04-20 MED ORDER — FERROUS SULFATE 325 (65 FE) MG PO TABS
325.0000 mg | ORAL_TABLET | Freq: Every day | ORAL | Status: DC
  Administered 2016-04-21 – 2016-04-26 (×5): 325 mg via ORAL
  Filled 2016-04-20 (×9): qty 1

## 2016-04-20 MED ORDER — ACETAMINOPHEN 650 MG RE SUPP: 650.0000 mg | Freq: Four times a day (QID) | RECTAL | Status: DC | PRN

## 2016-04-20 MED ORDER — FENTANYL CITRATE (PF) 100 MCG/2ML IJ SOLN
INTRAMUSCULAR | Status: AC
  Filled 2016-04-20: qty 2

## 2016-04-20 MED ORDER — MIDAZOLAM HCL 5 MG/5ML IJ SOLN
INTRAMUSCULAR | Status: DC | PRN
  Administered 2016-04-20: 2 mg via INTRAVENOUS

## 2016-04-20 MED ORDER — SUGAMMADEX SODIUM 200 MG/2ML IV SOLN
INTRAVENOUS | Status: DC | PRN
  Administered 2016-04-20: 200 mg via INTRAVENOUS

## 2016-04-20 MED ORDER — LIDOCAINE HCL (CARDIAC) 20 MG/ML IV SOLN
INTRAVENOUS | Status: DC | PRN
  Administered 2016-04-20: 100 mg via INTRAVENOUS

## 2016-04-20 MED ORDER — METOCLOPRAMIDE HCL 5 MG/ML IJ SOLN: 5.0000 mg | Freq: Three times a day (TID) | INTRAMUSCULAR | Status: DC | PRN

## 2016-04-20 MED ORDER — INSULIN GLARGINE 100 UNIT/ML ~~LOC~~ SOLN
20.0000 [IU] | Freq: Two times a day (BID) | SUBCUTANEOUS | Status: DC
  Administered 2016-04-20 – 2016-04-26 (×12): 20 [IU] via SUBCUTANEOUS
  Filled 2016-04-20 (×13): qty 0.2

## 2016-04-20 MED ORDER — PIPERACILLIN-TAZOBACTAM 3.375 G IVPB
3.3750 g | Freq: Three times a day (TID) | INTRAVENOUS | Status: DC
  Administered 2016-04-20 – 2016-04-23 (×9): 3.375 g via INTRAVENOUS
  Filled 2016-04-20 (×11): qty 50

## 2016-04-20 MED ORDER — VITAMIN B-12 1000 MCG PO TABS
500.0000 ug | ORAL_TABLET | Freq: Every morning | ORAL | Status: DC
  Administered 2016-04-21 – 2016-04-26 (×6): 500 ug via ORAL
  Filled 2016-04-20 (×7): qty 1

## 2016-04-20 MED ORDER — SODIUM CHLORIDE 0.9 % IR SOLN
Status: DC | PRN
  Administered 2016-04-20: 3000 mL

## 2016-04-20 MED ORDER — PHENOL 1.4 % MT LIQD: 1.0000 | OROMUCOSAL | Status: DC | PRN

## 2016-04-20 MED ORDER — GENTAMICIN SULFATE 40 MG/ML IJ SOLN
INTRAMUSCULAR | Status: DC | PRN
  Administered 2016-04-20: 480 mg

## 2016-04-20 MED ORDER — ALLOPURINOL 100 MG PO TABS: 100.0000 mg | ORAL_TABLET | Freq: Every day | ORAL | Status: DC | PRN

## 2016-04-20 MED ORDER — ASPIRIN EC 81 MG PO TBEC: 81.0000 mg | DELAYED_RELEASE_TABLET | Freq: Every day | ORAL | Status: DC

## 2016-04-20 MED ORDER — ONDANSETRON HCL 4 MG/2ML IJ SOLN
4.0000 mg | Freq: Four times a day (QID) | INTRAMUSCULAR | Status: DC | PRN
  Filled 2016-04-20: qty 2

## 2016-04-20 MED ORDER — MAGNESIUM OXIDE 400 (241.3 MG) MG PO TABS
400.0000 mg | ORAL_TABLET | Freq: Every morning | ORAL | Status: DC
  Administered 2016-04-21 – 2016-04-26 (×5): 400 mg via ORAL
  Filled 2016-04-20 (×6): qty 1

## 2016-04-20 MED ORDER — VANCOMYCIN HCL IN DEXTROSE 1-5 GM/200ML-% IV SOLN
INTRAVENOUS | Status: AC
  Filled 2016-04-20: qty 200

## 2016-04-20 MED ORDER — FUROSEMIDE 40 MG PO TABS
80.0000 mg | ORAL_TABLET | Freq: Every morning | ORAL | Status: DC
  Administered 2016-04-21 – 2016-04-26 (×6): 80 mg via ORAL
  Filled 2016-04-20 (×6): qty 2

## 2016-04-20 MED ORDER — VANCOMYCIN HCL 1000 MG IV SOLR
INTRAVENOUS | Status: AC
  Filled 2016-04-20: qty 2000

## 2016-04-20 MED ORDER — HYDROCODONE-ACETAMINOPHEN 10-325 MG PO TABS
1.0000 | ORAL_TABLET | Freq: Four times a day (QID) | ORAL | Status: DC | PRN
  Administered 2016-04-20 – 2016-04-26 (×9): 1 via ORAL
  Filled 2016-04-20 (×11): qty 1

## 2016-04-20 MED ORDER — MENTHOL 3 MG MT LOZG: 1.0000 | LOZENGE | OROMUCOSAL | Status: DC | PRN

## 2016-04-20 MED ORDER — CARVEDILOL 12.5 MG PO TABS
12.5000 mg | ORAL_TABLET | Freq: Two times a day (BID) | ORAL | Status: DC
  Administered 2016-04-20 – 2016-04-21 (×2): 12.5 mg via ORAL
  Filled 2016-04-20 (×3): qty 1

## 2016-04-20 SURGICAL SUPPLY — 80 items
BANDAGE ACE 4X5 VEL STRL LF (GAUZE/BANDAGES/DRESSINGS) ×3 IMPLANT
BANDAGE ESMARK 6X9 LF (GAUZE/BANDAGES/DRESSINGS) ×1 IMPLANT
BLADE SAW SGTL 13.0X1.19X90.0M (BLADE) IMPLANT
BNDG COHESIVE 6X5 TAN STRL LF (GAUZE/BANDAGES/DRESSINGS) ×3 IMPLANT
BNDG ELASTIC 6X10 VLCR STRL LF (GAUZE/BANDAGES/DRESSINGS) ×9 IMPLANT
BNDG ESMARK 6X9 LF (GAUZE/BANDAGES/DRESSINGS) ×3
BOWL SMART MIX CTS (DISPOSABLE) ×3 IMPLANT
CANISTER WOUND CARE 500ML ATS (WOUND CARE) ×3 IMPLANT
CASSETTE VERAFLO VERALINK (MISCELLANEOUS) ×3 IMPLANT
COVER BACK TABLE 24X17X13 BIG (DRAPES) IMPLANT
COVER SURGICAL LIGHT HANDLE (MISCELLANEOUS) ×3 IMPLANT
CUFF TOURNIQUET SINGLE 34IN LL (TOURNIQUET CUFF) IMPLANT
CUFF TOURNIQUET SINGLE 44IN (TOURNIQUET CUFF) IMPLANT
DRAPE IMP U-DRAPE 54X76 (DRAPES) ×3 IMPLANT
DRAPE INCISE IOBAN 66X45 STRL (DRAPES) IMPLANT
DRAPE INCISE IOBAN 85X60 (DRAPES) ×3 IMPLANT
DRAPE ORTHO SPLIT 77X108 STRL (DRAPES) ×6
DRAPE PROXIMA HALF (DRAPES) ×3 IMPLANT
DRAPE SURG ORHT 6 SPLT 77X108 (DRAPES) ×3 IMPLANT
DRAPE U-SHAPE 47X51 STRL (DRAPES) ×3 IMPLANT
DRAPE X-RAY CASS 24X20 (DRAPES) IMPLANT
DRSG PAD ABDOMINAL 8X10 ST (GAUZE/BANDAGES/DRESSINGS) ×6 IMPLANT
DRSG VERAFLO VAC MED (GAUZE/BANDAGES/DRESSINGS) ×3 IMPLANT
DURAPREP 26ML APPLICATOR (WOUND CARE) ×3 IMPLANT
ELECT REM PT RETURN 9FT ADLT (ELECTROSURGICAL) ×3
ELECTRODE REM PT RTRN 9FT ADLT (ELECTROSURGICAL) ×1 IMPLANT
EVACUATOR 1/8 PVC DRAIN (DRAIN) ×3 IMPLANT
FACESHIELD WRAPAROUND (MASK) ×3 IMPLANT
FLUID NSS /IRRIG 3000 ML XXX (IV SOLUTION) ×3 IMPLANT
GAUZE SPONGE 4X4 12PLY STRL (GAUZE/BANDAGES/DRESSINGS) ×3 IMPLANT
GAUZE XEROFORM 5X9 LF (GAUZE/BANDAGES/DRESSINGS) ×3 IMPLANT
GLOVE BIO SURGEON ST LM GN SZ9 (GLOVE) ×3 IMPLANT
GLOVE BIOGEL PI IND STRL 8 (GLOVE) ×1 IMPLANT
GLOVE BIOGEL PI INDICATOR 8 (GLOVE) ×2
GLOVE SURG ORTHO 8.0 STRL STRW (GLOVE) ×3 IMPLANT
GOWN STRL REUS W/ TWL LRG LVL3 (GOWN DISPOSABLE) ×3 IMPLANT
GOWN STRL REUS W/ TWL XL LVL3 (GOWN DISPOSABLE) ×1 IMPLANT
GOWN STRL REUS W/TWL LRG LVL3 (GOWN DISPOSABLE) ×6
GOWN STRL REUS W/TWL XL LVL3 (GOWN DISPOSABLE) ×2
HANDPIECE INTERPULSE COAX TIP (DISPOSABLE) ×2
HOOD PEEL AWAY FACE SHEILD DIS (HOOD) ×9 IMPLANT
IMMOBILIZER KNEE 20 (SOFTGOODS) IMPLANT
IMMOBILIZER KNEE 22 UNIV (SOFTGOODS) IMPLANT
IMMOBILIZER KNEE 24 THIGH 36 (MISCELLANEOUS) IMPLANT
IMMOBILIZER KNEE 24 UNIV (MISCELLANEOUS)
KIT BASIN OR (CUSTOM PROCEDURE TRAY) ×3 IMPLANT
KIT ROOM TURNOVER OR (KITS) ×3 IMPLANT
KIT STIMULAN RAPID CURE  10CC (Orthopedic Implant) ×4 IMPLANT
KIT STIMULAN RAPID CURE 10CC (Orthopedic Implant) ×2 IMPLANT
MANIFOLD NEPTUNE II (INSTRUMENTS) ×3 IMPLANT
NEEDLE 18GX1X1/2 (RX/OR ONLY) (NEEDLE) IMPLANT
NEEDLE SPNL 18GX3.5 QUINCKE PK (NEEDLE) IMPLANT
NS IRRIG 1000ML POUR BTL (IV SOLUTION) ×3 IMPLANT
PACK TOTAL JOINT (CUSTOM PROCEDURE TRAY) ×3 IMPLANT
PACK UNIVERSAL I (CUSTOM PROCEDURE TRAY) ×3 IMPLANT
PAD ARMBOARD 7.5X6 YLW CONV (MISCELLANEOUS) ×6 IMPLANT
PAD CAST 4YDX4 CTTN HI CHSV (CAST SUPPLIES) ×1 IMPLANT
PADDING CAST COTTON 4X4 STRL (CAST SUPPLIES) ×2
PADDING CAST COTTON 6X4 STRL (CAST SUPPLIES) ×9 IMPLANT
RUBBERBAND STERILE (MISCELLANEOUS) IMPLANT
SET HNDPC FAN SPRY TIP SCT (DISPOSABLE) ×1 IMPLANT
SPONGE LAP 18X18 X RAY DECT (DISPOSABLE) ×6 IMPLANT
STAPLER VISISTAT 35W (STAPLE) ×3 IMPLANT
SUCTION FRAZIER HANDLE 10FR (MISCELLANEOUS) ×2
SUCTION TUBE FRAZIER 10FR DISP (MISCELLANEOUS) ×1 IMPLANT
SUT ETHILON 3 0 PS 1 (SUTURE) ×6 IMPLANT
SUT PDS AB 0 CT 36 (SUTURE) ×6 IMPLANT
SUT VIC AB 0 CTB1 27 (SUTURE) ×9 IMPLANT
SUT VIC AB 1 CT1 27 (SUTURE) ×10
SUT VIC AB 1 CT1 27XBRD ANBCTR (SUTURE) ×5 IMPLANT
SUT VIC AB 2-0 CT1 27 (SUTURE) ×4
SUT VIC AB 2-0 CT1 TAPERPNT 27 (SUTURE) ×2 IMPLANT
SWAB COLLECTION DEVICE MRSA (MISCELLANEOUS) IMPLANT
SYR 30ML SLIP (SYRINGE) IMPLANT
SYR TB 1ML LUER SLIP (SYRINGE) IMPLANT
TOWEL OR 17X24 6PK STRL BLUE (TOWEL DISPOSABLE) ×3 IMPLANT
TOWEL OR 17X26 10 PK STRL BLUE (TOWEL DISPOSABLE) ×12 IMPLANT
TRAY FOLEY CATH 16FRSI W/METER (SET/KITS/TRAYS/PACK) ×3 IMPLANT
TUBE ANAEROBIC SPECIMEN COL (MISCELLANEOUS) IMPLANT
WATER STERILE IRR 1000ML POUR (IV SOLUTION) ×9 IMPLANT

## 2016-04-20 NOTE — Progress Notes (Signed)
Orthopedic Tech Progress Note Patient Details:  Norma [name] [birth year] [id number]  Patient ID: [name], female   DOB: 10/28/1945, 70 y.o.   MRN: [id number] Applied ohf to bed  Karolee Stamps 04/20/2016, 8:48 PM

## 2016-04-20 NOTE — Brief Op Note (Signed)
04/20/2016  6:03 PM  PATIENT:  Norma [name]  70 y.o. female  PRE-OPERATIVE DIAGNOSIS:  Right Hip Infection  POST-OPERATIVE DIAGNOSIS:  Right Hip Infection superficial  PROCEDURE:  Procedure(s): IRRIGATION AND DEBRIDEMENT HIP WITH POLY EXCHANGE, ABX BEAD PLACEMENT, WOUND VAC   SURGEON:  Surgeon(s): Meredith Pel, MD  ASSISTANT: Angela Nevin BethuneRNFA  ANESTHESIA:   general  EBL: 150 ml    Total I/O In: 500 [I.V.:500] Out: -   BLOOD ADMINISTERED: none  DRAINS: veraflow wound vac   LOCAL MEDICATIONS USED:  none  SPECIMEN: cxs x 2  COUNTS:  YES  TOURNIQUET:  * No tourniquets in log *  DICTATION: .Other Dictation: Dictation Number [id number]  PLAN OF CARE: Admit to inpatient   PATIENT DISPOSITION:  PACU - hemodynamically stable

## 2016-04-20 NOTE — Progress Notes (Signed)
Pt BP 210/55, HR in 60s. Pt asymptomatic. . Dr. Delma Post notified and ordered for patient to take daily dose of clonidine.

## 2016-04-20 NOTE — Anesthesia Postprocedure Evaluation (Signed)
Anesthesia Post Note  Patient: Norma [name]  Procedure(s) Performed: Procedure(s) (LRB): IRRIGATION AND DEBRIDEMENT HIP WITH POLY EXCHANGE, ABX BEAD PLACEMENT, WOUND VAC  (Right)  Patient location during evaluation: PACU Anesthesia Type: General Level of consciousness: awake and alert Pain management: pain level controlled Vital Signs Assessment: post-procedure vital signs reviewed and stable Respiratory status: spontaneous breathing, nonlabored ventilation, respiratory function stable and patient connected to nasal cannula oxygen Cardiovascular status: blood pressure returned to baseline and stable Postop Assessment: no signs of nausea or vomiting Anesthetic complications: no    Last Vitals:  Vitals:   04/20/16 1855 04/20/16 1910  BP: (!) 165/56 (!) 162/53  Pulse: (!) 45 (!) 46  Resp: 14 15  Temp:  36.7 C    Last Pain:  Vitals:   04/20/16 1910  TempSrc:   PainSc: Asleep                 Taher Vannote A

## 2016-04-20 NOTE — Anesthesia Preprocedure Evaluation (Addendum)
Anesthesia Evaluation  Patient identified by MRN, date of birth, ID band Patient awake    Reviewed: Allergy & Precautions, NPO status , Patient's Chart, lab work & pertinent test results  Airway Mallampati: II  TM Distance: >3 FB Neck ROM: Full    Dental no notable dental hx.    Pulmonary shortness of breath, former smoker,    Pulmonary exam normal breath sounds clear to auscultation       Cardiovascular hypertension, Pt. on medications and Pt. on home beta blockers + CAD and + Peripheral Vascular Disease  Normal cardiovascular exam Rhythm:Regular Rate:Normal  Last cardiology office visit 11-24-15 with Dr. Martinique reviewed. H/O difficult to control HTN   ECHO 09-04-12: Study Conclusions  - Left ventricle: The cavity size was normal. Wall thickness was increased in a pattern of severe LVH. Systolic function was normal. The estimated ejection fraction was in the range of 60% to 65%. Wall motion was normal; there were no regional wall motion abnormalities. Doppler parameters are consistent with abnormal left ventricular relaxation (grade 1 diastolic dysfunction). Doppler parameters are consistent with high ventricular filling pressure. - Mitral valve: Calcified annulus. - Left atrium: The atrium was moderately dilated. - Pulmonary arteries: Systolic pressure was moderately increased. PA peak pressure: 67mm Hg (S). - Pericardium, extracardiac: A trivial pericardial effusion was identified.   Neuro/Psych Anxiety negative neurological ROS     GI/Hepatic negative GI ROS, Neg liver ROS,   Endo/Other  diabetes, Type 2Morbid obesity  Renal/GU Renal disease  negative genitourinary   Musculoskeletal  (+) Arthritis ,   Abdominal (+) + obese,   Peds negative pediatric ROS (+)  Hematology  (+) anemia , HGB 8.2   Anesthesia Other Findings   Reproductive/Obstetrics negative OB ROS                          Anesthesia Physical Anesthesia Plan  ASA: III  Anesthesia Plan: General   Post-op Pain Management:    Induction: Intravenous  Airway Management Planned: Oral ETT  Additional Equipment:   Intra-op Plan:   Post-operative Plan: Extubation in OR  Informed Consent: I have reviewed the patients History and Physical, chart, labs and discussed the procedure including the risks, benefits and alternatives for the proposed anesthesia with the patient or authorized representative who has indicated his/her understanding and acceptance.   Dental advisory given  Plan Discussed with: CRNA  Anesthesia Plan Comments: (Blood pressure elevated. She has taken her coreg. Will give her clonidine.)       Anesthesia Quick Evaluation

## 2016-04-20 NOTE — Progress Notes (Signed)
Pharmacy Antibiotic Note  Norma [name] is a 70 y.o. female admitted on 04/20/2016 with right hip infection.  Pharmacy has been consulted for vancomycin and Zosyn dosing. I & D with antibiotic bead placement on 10/13.  Plan: Vancomycin 1,250 mg IV every 24 hours.  Goal trough 10-15 mcg/mL.  Zosyn 3.375g IV q8 hour (infused over 4 hours) Follow-up c/s  Height: 5\' 2"  (157.5 cm) Weight: 214 lb (97.1 kg) IBW/kg (Calculated) : 50.1  Temp (24hrs), Avg:97.9 F (36.6 C), Min:97.5 F (36.4 C), Max:98.3 F (36.8 C)   Recent Labs Lab 04/20/16 1337  WBC 6.5  CREATININE 1.46*    Estimated Creatinine Clearance: 39 mL/min (by C-G formula based on SCr of 1.46 mg/dL (H)).    Allergies  Allergen Reactions  . Iohexol Itching and Rash     Code: RASH, Desc: pt called 1 day post scanning stating that skin was red and "itching all over" some what better but still had symptom.. instructed pt to take benadryl to relieve symptoms,per dr Alvester Chou.if any problems call back/mms, Onset Date: [id number]   . Sulfa Antibiotics Other (See Comments)    UNSPECIFIED REACTION    Thank you for allowing pharmacy to be a part of this patient's care.  Jodean Lima Tyah Acord 04/20/2016 7:42 PM

## 2016-04-20 NOTE — Progress Notes (Signed)
Infection superficial right hip Plan dc sun vs mon Will need vereflow wound vac for several weeks Will go home on oral abx Would be ideal to tailor abx to organism which may put her discharge off til monday

## 2016-04-20 NOTE — Transfer of Care (Signed)
Immediate Anesthesia Transfer of Care Note  Patient: Norma [name]  Procedure(s) Performed: Procedure(s) with comments: IRRIGATION AND DEBRIDEMENT HIP WITH POLY EXCHANGE, ABX BEAD PLACEMENT, WOUND VAC  (Right) - RIGHT HIP SUPERFICIAL I&D, POSSIBLE DEEP I&D, LINER EXCHANGE, ABX BEAD PLACEMENT, WOUND VAC.   Patient Location: PACU  Anesthesia Type:General  Level of Consciousness: awake, alert  and oriented  Airway & Oxygen Therapy: Patient Spontanous Breathing and Patient connected to nasal cannula oxygen  Post-op Assessment: Report given to RN and Post -op Vital signs reviewed and stable  Post vital signs: Reviewed and stable  Last Vitals:  Vitals:   04/20/16 1341 04/20/16 1807  BP: (!) 210/55 (!) 173/63  Pulse: 61 (!) 50  Resp:  15  Temp:  (P) 36.4 C    Last Pain:  Vitals:   04/20/16 1304  TempSrc: Oral      Patients Stated Pain Goal: (P) 0 (59/27/63 9432)  Complications: No apparent anesthesia complications

## 2016-04-20 NOTE — Progress Notes (Signed)
PHARMACIST - PHYSICIAN ORDER COMMUNICATION  CONCERNING: P&T Medication Policy on Herbal Medications  DESCRIPTION:  This patient's order for:  Coenzyme Q 10 has been noted.  This product(s) is classified as an "herbal" or natural product. Due to a lack of definitive safety studies or FDA approval, nonstandard manufacturing practices, plus the potential risk of unknown drug-drug interactions while on inpatient medications, the Pharmacy and Therapeutics Committee does not permit the use of "herbal" or natural products of this type within [facility].   ACTION TAKEN: The pharmacy department is unable to verify this order at this time and your patient has been informed of this safety policy. Please reevaluate patient's clinical condition at discharge and address if the herbal or natural product(s) should be resumed at that time.  Reatha Harps, Pharm.D., BCPS Clinical Pharmacist 04/20/2016 8:01 PM

## 2016-04-20 NOTE — Interval H&P Note (Signed)
History and Physical Interval Note:  04/20/2016 3:27 PM  Norma [name]  has presented today for surgery, with the diagnosis of Right Hip Infection  The various methods of treatment have been discussed with the patient and family. After consideration of risks, benefits and other options for treatment, the patient has consented to  Procedure(s) with comments: IRRIGATION AND DEBRIDEMENT HIP WITH POLY EXCHANGE, ABX BEAD PLACEMENT, WOUND VAC  (Right) - RIGHT HIP SUPERFICIAL I&D, POSSIBLE DEEP I&D, LINER EXCHANGE, ABX BEAD PLACEMENT, WOUND VAC.  as a surgical intervention .  The patient's history has been reviewed, patient examined, no change in status, stable for surgery.  I have reviewed the patient's chart and labs.  Questions were answered to the patient's satisfaction.   Superficial vs deep infection right hip - plan I and d and bead placement all ? answered  Prabhav Faulkenberry SCOTT

## 2016-04-20 NOTE — Anesthesia Procedure Notes (Signed)
Procedure Name: Intubation Date/Time: 04/20/2016 4:11 PM Performed by: Clearnce Sorrel Pre-anesthesia Checklist: Patient identified, Emergency Drugs available, Suction available, Patient being monitored and Timeout performed Patient Re-evaluated:Patient Re-evaluated prior to inductionOxygen Delivery Method: Circle system utilized Preoxygenation: Pre-oxygenation with 100% oxygen Intubation Type: IV induction Ventilation: Mask ventilation without difficulty and Oral airway inserted - appropriate to patient size Laryngoscope Size: Mac and 3 Grade View: Grade I Tube type: Oral Tube size: 7.0 mm Number of attempts: 1 Airway Equipment and Method: Stylet Placement Confirmation: ETT inserted through vocal cords under direct vision,  positive ETCO2 and breath sounds checked- equal and bilateral Secured at: 22 cm Tube secured with: Tape Dental Injury: Teeth and Oropharynx as per pre-operative assessment

## 2016-04-20 NOTE — H&P (Signed)
Norma [name] is an 70 y.o. female.   Chief Complaint: Right hip infection HPI: Norma [name] is a 70 year old female with multiple medical problems including diabetes who underwent total hip replacement approximately 6-7 weeks ago.  10 days ago she was noted to have early incisional breakdown which is developed into a superficial wound which may be a deep wound.  She's had no fever chills has not been on antibiotics and denies any pain in the hip.  She has been ambulatory.  Past Medical History:  Diagnosis Date  . Anxiety   . Carotid artery occlusion   . CKD (chronic kidney disease) stage 3, GFR 30-59 ml/min 04/18/2014  . Claudication ([place])   . Constipation    Due to patient taking iron supplements  . Coronary artery disease   . DDD (degenerative disc disease)   . Diabetes mellitus   . Diabetic coma ([place])   . DJD (degenerative joint disease)   . DJD (degenerative joint disease)   . Edema    Bilateral lower extermities  . Gout   . Hyperlipidemia   . Hypertension   . Iron deficiency anemia   . Leg pain   . Obesity   . PAD (peripheral artery disease) ([place])   . Shortness of breath    exertion    Past Surgical History:  Procedure Laterality Date  . AORTA - BILATERAL FEMORAL ARTERY BYPASS GRAFT  05/25/2011   Procedure: AORTA BIFEMORAL BYPASS GRAFT;  Surgeon: Mal Misty, MD;  Location: [place];  Service: Vascular;  Laterality: N/A;  . BREAST SURGERY    . CARDIAC CATHETERIZATION  12/01/2009   EF 65%  . CARDIOVASCULAR STRESS TEST  11/28/2009   EF 75%  . COLONOSCOPY W/ POLYPECTOMY    . CORONARY ARTERY BYPASS GRAFT  12/08/2009   LIMA GRAFT TO THE DISTAL LAD AND SAPHENOUS VEIN GRAFT TO THE OBTUSE MARGINAL VESSEL  . EYE SURGERY    . PR VEIN BYPASS GRAFT,AORTO-FEM-POP  05/25/11  . REMOVAL OF FIBROUS CYST FROM RIGHT BREAST  10+ YEARS  . RETINAL DETACHMENT SURGERY  10+ YEARS   LEFT EYE  . TOTAL HIP ARTHROPLASTY Right 03/06/2016  . TOTAL HIP ARTHROPLASTY Right 03/06/2016   Procedure: RIGHT  TOTAL HIP ARTHROPLASTY ANTERIOR APPROACH;  Surgeon: Meredith Pel, MD;  Location: [facility];  Service: Orthopedics;  Laterality: Right;  . TRANSTHORACIC ECHOCARDIOGRAM  12/01/2009   EF 60-65%    Family History  Problem Relation Age of Onset  . Hypertension Mother   . Coronary artery disease Father   . Hypertension Sister   . Cirrhosis Brother   . Kidney disease Daughter    Social History:  reports that she quit smoking about 24 years ago. Her smoking use included Cigarettes. She quit after 20.00 years of use. She has never used smokeless tobacco. She reports that she does not drink alcohol or use drugs.  Allergies:  Allergies  Allergen Reactions  . Iohexol Itching and Rash     Code: RASH, Desc: pt called 1 day post scanning stating that skin was red and "itching all over" some what better but still had symptom.. instructed pt to take benadryl to relieve symptoms,per dr Alvester Chou.if any problems call back/mms, Onset Date: [id number]   . Sulfa Antibiotics Other (See Comments)    UNSPECIFIED REACTION     No prescriptions prior to admission.    No results found for this or any previous visit (from the past 48 hour(s)). No results found.  Review of Systems  Constitutional: Negative.   HENT: Negative.   Eyes: Negative.   Respiratory: Negative.   Cardiovascular: Negative.   Gastrointestinal: Negative.   Genitourinary: Negative.   Musculoskeletal: Negative.   Skin: Negative.   Neurological: Negative.   Endo/Heme/Allergies: Negative.     Height 5\' 2"  (1.575 m), weight 97.1 kg (214 lb). Physical Exam  Constitutional: She appears well-developed.  HENT:  Head: Normocephalic.  Eyes: Pupils are equal, round, and reactive to light.  Cardiovascular: Normal rate.   Respiratory: Effort normal.  Neurological: She is alert.  Skin: Skin is warm.  Psychiatric: She has a normal mood and affect.   right hip demonstrates painless range of motion the inferior aspect of her incision does have  an area which has broken down and has about a 3 cm x 3 cm x 3 cm cone-shaped area of skin and wound breakdown.  This area has been packed.  Area of induration and slightly proximal to it.  No real deep reflux of fluid however there is breakdown of the incision.  Leg lengths are equal -  there is no pain with range of motion of the hip   Assessment/Plan Impression is right hip infection which appears superficial may be deep she is failed a course of local wound care.  She has not been on antibiotics yet.  Plan is for I&D with debridement possible deep debridement depending on how the incision looks.  We'll proceed with wound VAC placement at that time.  Risks and benefits discussed all questions answered.  Meredith Pel, MD 04/20/2016, 12:07 AM

## 2016-04-21 LAB — GLUCOSE, CAPILLARY
GLUCOSE-CAPILLARY: 244 mg/dL — AB (ref 65–99)
GLUCOSE-CAPILLARY: 176 mg/dL — AB (ref 65–99)
Glucose-Capillary: 162 mg/dL — ABNORMAL HIGH (ref 65–99)

## 2016-04-21 MED ORDER — GLUCERNA SHAKE PO LIQD
237.0000 mL | Freq: Three times a day (TID) | ORAL | Status: DC
  Administered 2016-04-21 – 2016-04-26 (×12): 237 mL via ORAL

## 2016-04-21 NOTE — Op Note (Signed)
NAME:  Norma [name], Norma [name] NO.:  [id number]  MEDICAL RECORD NO.:  [id number]  LOCATION:  [id number]                        FACILITY:  [facility]  PHYSICIAN:  Anderson Malta, M.D.    DATE OF BIRTH:  Jan 31, 1946  DATE OF PROCEDURE: DATE OF DISCHARGE:                              OPERATIVE REPORT   PREOPERATIVE DIAGNOSIS:  Right hip superficial infection.  POSTOPERATIVE DIAGNOSIS:  Right hip superficial infection.  PROCEDURE PERFORMED:  Right hip superficial infection, excisional debridement, with placement of antibiotic beads and wound VAC Veraflo.  SURGEON:  Anderson Malta, M.D.  ASSISTANT:  None.  ANESTHESIA:  General.  INDICATIONS:  [name] is a 70 year old patient, who underwent hip replacement 6 weeks ago.  She did have diabetes which was relatively well controlled prior to surgery.  Developed a superficial wound infection about 4 weeks out and this actually grew and enlarged to about 3 x 3 cm area.  She is brought to the operating room now.  She has not been on antibiotics but presents now for debridement with the preoperative plan being superficial debridement and inspection of the deep closure with possible deep debridement and poly exchange.  DESCRIPTION OF PROCEDURE:  The patient was brought to operating room where general anesthetic was induced.  Preoperative IV antibiotics were not given until after cultures were obtained.  The patient was placed on the [facility] bed.  Right hip was prepped with DuraPrep and then Betadine. Time-out was called.  The prior incision was utilized and extended about 3-4 cm proximally and distally.  Cultures were obtained.  The excisional debridement was taken down to the fascia.  The fascia was inspected along its entire length and there were no defects in the fascia and there was efflux of fluid from the fascia.  In general, the deep fascia was closed over the hip wound and this is consistent with her clinical exam, was not having any  fevers or pain in the hip.  Following extensive debridement and irrigation, antibiotic beads were placed and Veraflo sponges then placed on top of the retention sutures placed.  At this time, wound VAC settings were made. We are going to keep her in-house for 48 hours for IV antibiotics and discharge on p.o. antibiotics.  The patient tolerated the procedure well without immediate complications.  Transferred to recovery room in stable condition.     Anderson Malta, M.D.     GSD/MEDQ  D:  04/20/2016  T:  04/21/2016  Job:  [id number]

## 2016-04-21 NOTE — Progress Notes (Signed)
Subjective: Pt stable - pain ok   Objective: Vital signs in last 24 hours: Temp:  [97.5 F (36.4 C)-98.3 F (36.8 C)] 97.5 F (36.4 C) (10/14 0558) Pulse Rate:  [43-64] 45 (10/14 0558) Resp:  [14-20] 16 (10/14 0558) BP: (99-219)/(30-63) 134/63 (10/14 0558) SpO2:  [96 %-100 %] 100 % (10/14 0558) Weight:  [97.1 kg (214 lb)] 97.1 kg (214 lb) (10/13 1304)  Intake/Output from previous day: 10/13 0701 - 10/14 0700 In: 846 [I.V.:800] Out: 265 [Drains:215; Blood:50] Intake/Output this shift: No intake/output data recorded.  Exam:  Sensation intact distally No cellulitis present  Labs:  Recent Labs  04/20/16 1337  HGB 7.8*    Recent Labs  04/20/16 1337  WBC 6.5  RBC 2.86*  HCT 25.7*  PLT 207    Recent Labs  04/20/16 1337  NA 143  K 4.3  CL 115*  CO2 21*  BUN 27*  CREATININE 1.46*  GLUCOSE 109*  CALCIUM 8.7*   No results for input(s): LABPT, INR in the last 72 hours.  Assessment/Plan: Pt stable - hgb a little low - awaiting cultures - dc monday   DEAN,GREGORY SCOTT 04/21/2016, 9:36 AM

## 2016-04-21 NOTE — Progress Notes (Signed)
Physical Therapy Evaluation Patient Details Name: Norma [name] MRN: [id number] DOB: [birth year] Today's Date: 04/21/2016   History of Present Illness  Patient had a THA right on 03/06/2016. She was home and doing well until she ended up with an infection. She had an I and D and a wound vac placed on 10/13. PMH includes: DDD, HTN, DMII, coratid occlusion, gout, anxiety, DDD, CKD    Clinical Impression  Patient presents with decreased strength and endurance with all functional tasks. She only walked 3 feet today but likely could have walked more if not for pain. She will have family support at home. She would benefit from a return to home with home health services vs SNF if she can increase her ambulation distance over the nest few days. She would benefit from further skilled acute therapy.     Follow Up Recommendations Home health PT    Equipment Recommendations  None recommended by PT    Recommendations for Other Services       Precautions / Restrictions Precautions Precautions: Anterior Hip Restrictions Weight Bearing Restrictions: Yes RLE Weight Bearing: Weight bearing as tolerated      Mobility  Bed Mobility Overal bed mobility: Needs Assistance Bed Mobility: Supine to Sit     Supine to sit: Min assist     General bed mobility comments: Min a to scoot to the edge of the bed   Transfers Overall transfer level: Needs assistance Equipment used: Rolling walker (2 wheeled) Transfers: Sit to/from Stand Sit to Stand: Min assist         General transfer comment: Min a for strength to stand, Once standing contact guard to transfer to a chair,.   Ambulation/Gait Ambulation/Gait assistance: Min guard Ambulation Distance (Feet): 3 Feet Assistive device: Rolling walker (2 wheeled) Gait Pattern/deviations: Decreased stance time - right;Decreased step length - right     General Gait Details: Patient declined to walk furter 2nd to pain even though she likley could have.  She was steady requiring only min guard but her distance was limited.   Stairs            Wheelchair Mobility    Modified Rankin (Stroke Patients Only)       Balance Overall balance assessment: Needs assistance Sitting-balance support: No upper extremity supported Sitting balance-Leahy Scale: Good     Standing balance support: Bilateral upper extremity supported Standing balance-Leahy Scale: Poor Standing balance comment: Needs RW                             Pertinent Vitals/Pain Pain Assessment: 0-10 Pain Score: 7  Pain Location: right hip  Pain Descriptors / Indicators: Aching Pain Intervention(s): Limited activity within patient's tolerance;Monitored during session;Repositioned;Premedicated before session    Home Living   Living Arrangements: Alone Available Help at Discharge: Family Type of Home: Apartment Home Access: Level entry       Home Equipment: Walker - 2 wheels;Bedside commode;Tub bench;Grab bars - tub/shower Additional Comments: Alone but has adult children who will assist her 24 hours a day     Prior Function Level of Independence: Independent with assistive device(s)         Comments: was using walker with assist of children     Hand Dominance   Dominant Hand: Right    Extremity/Trunk Assessment   Upper Extremity Assessment: Defer to OT evaluation           Lower Extremity Assessment: Generalized weakness  Communication   Communication: No difficulties  Cognition Arousal/Alertness: Awake/alert Behavior During Therapy: Anxious Overall Cognitive Status: Within Functional Limits for tasks assessed                      General Comments      Exercises     Assessment/Plan    PT Assessment Patient needs continued PT services  PT Problem List Decreased strength;Decreased range of motion;Decreased activity tolerance;Decreased balance;Decreased mobility;Decreased coordination;Pain;Obesity           PT Treatment Interventions Gait training;Stair training;Functional mobility training;Therapeutic activities;Therapeutic exercise;DME instruction;Balance training;Neuromuscular re-education;Patient/family education;Manual techniques;Modalities    PT Goals (Current goals can be found in the Care Plan section)  Acute Rehab PT Goals Patient Stated Goal: To go home  PT Goal Formulation: With patient Time For Goal Achievement: 05/05/16 Potential to Achieve Goals: Good    Frequency 7X/week   Barriers to discharge   No barriers. Patient had been home after initial operation.     Co-evaluation               End of Session Equipment Utilized During Treatment: Gait belt Activity Tolerance: Patient limited by pain Patient left: in chair;with call bell/phone within reach Nurse Communication: Patient requests pain meds;Weight bearing status         Time: 0950-1010 PT Time Calculation (min) (ACUTE ONLY): 20 min   Charges:   PT Evaluation $PT Eval Low Complexity: 1 Procedure     PT G Codes:        Carney Living PT DPT  04/21/2016, 1:18 PM

## 2016-04-21 NOTE — Progress Notes (Signed)
Pt BP 117/39 Pulse 43. This nurse call MD on call for Dr. Marlou Sa and was told to give 564ml bolus. Will admin. And recheck Bp.   Eleanora Neighbor RN

## 2016-04-21 NOTE — Progress Notes (Addendum)
Initial Nutrition Assessment  DOCUMENTATION CODES:   Obesity unspecified  INTERVENTION:    Chocolate Glucerna Shake po TID, each supplement provides 220 kcal and 10 grams of protein  NUTRITION DIAGNOSIS:   Increased nutrient needs related to wound healing as evidenced by estimated needs  GOAL:   Patient will meet greater than or equal to 90% of their needs  MONITOR:   PO intake, Supplement acceptance, Labs, Weight trends, I & O's  REASON FOR ASSESSMENT:   Malnutrition Screening Tool  ASSESSMENT:   70 yo Female with multiple medical problems including diabetes who underwent total hip replacement approximately 6-7 weeks ago.  10 days ago she was noted to have early incisional breakdown which is developed into a superficial wound which may be a deep wound.   Patient s/p procedure 101/3: IRRIGATION AND DEBRIDEMENT HIP WITH POLY EXCHANGE, ABX BEAD PLACEMENT, WOUND VAC   Patient states her appetite is doing OK. No % PO intake flowsheet records available. Reports she takes Victoza (per prescription) to help control her A1C; it has also promoted some weight loss for her. Likes chocolate Glucerna Shakes >> will order. Labs and medications reviewed.  Nutrition focused physical exam completed.  No muscle or subcutaneous fat depletion noticed.  Diet Order:  Diet Carb Modified Fluid consistency: Thin; Room service appropriate? Yes  Skin:  Reviewed, no issues  Last BM:  10/12  CBG (last 3)   Recent Labs  04/20/16 1807 04/20/16 2306 04/21/16 0602  GLUCAP 143* 152* 162*    Height:   Ht Readings from Last 1 Encounters:  04/19/16 5\' 2"  (1.575 m)    Weight:   Wt Readings from Last 1 Encounters:  04/20/16 214 lb (97.1 kg)    Ideal Body Weight:     BMI:  Body mass index is 39.14 kg/m.  Estimated Nutritional Needs:   Kcal:  1800-2000  Protein:  90-100 gm  Fluid:  1.8-2.0 L  EDUCATION NEEDS:   No education needs identified at this time  Arthur Holms,  RD, LDN Pager #: [phone] After-Hours Pager #: [phone]

## 2016-04-22 LAB — GLUCOSE, CAPILLARY
GLUCOSE-CAPILLARY: 190 mg/dL — AB (ref 65–99)
GLUCOSE-CAPILLARY: 156 mg/dL — AB (ref 65–99)
GLUCOSE-CAPILLARY: 157 mg/dL — AB (ref 65–99)
Glucose-Capillary: 164 mg/dL — ABNORMAL HIGH (ref 65–99)

## 2016-04-22 NOTE — Progress Notes (Signed)
Physical Therapy Treatment Patient Details Name: Norma [name] MRN: [id number] DOB: 09-29-1945 Today's Date: 2016/05/16    History of Present Illness Patient had a THA right on 03/06/2016. She was home and doing well until she ended up with an infection. She had an I and D and a wound vac placed on 10/13. PMH includes: DDD, HTN, DMII, coratid occlusion, gout, anxiety, DDD, CKD      PT Comments    Patient did much better today with mobility.  Mostly limited by lines/tubes.  Patient was independent at home PTA and certain she will achieve that again.  Will benefit from continued PT to progress mobility and independence for d/c home.   Follow Up Recommendations  Home health PT     Equipment Recommendations  None recommended by PT    Recommendations for Other Services       Precautions / Restrictions Precautions Precautions: None (direct anterior hip) Restrictions RLE Weight Bearing: Weight bearing as tolerated    Mobility  Bed Mobility               General bed mobility comments: in recliner when PT arrived  Transfers Overall transfer level: Modified independent Equipment used: Rolling walker (2 wheeled) Transfers: Sit to/from Stand Sit to Stand: Modified independent (Device/Increase time)            Ambulation/Gait Ambulation/Gait assistance: Modified independent (Device/Increase time) Ambulation Distance (Feet): 20 Feet Assistive device: Rolling walker (2 wheeled) Gait Pattern/deviations: Step-to pattern Gait velocity: decreased   General Gait Details: required assistance for IV pole/ vac only   Stairs            Wheelchair Mobility    Modified Rankin (Stroke Patients Only)       Balance           Standing balance support: Bilateral upper extremity supported;During functional activity Standing balance-Leahy Scale: Poor Standing balance comment: needs RW                    Cognition Arousal/Alertness: Awake/alert Behavior  During Therapy: WFL for tasks assessed/performed Overall Cognitive Status: Within Functional Limits for tasks assessed                      Exercises Total Joint Exercises Ankle Circles/Pumps: AROM;Both;10 reps;Seated Quad Sets: AROM;Both;10 reps;Seated Gluteal Sets: AROM;Both;10 reps;Seated    General Comments        Pertinent Vitals/Pain Pain Assessment: No/denies pain    Home Living                      Prior Function            PT Goals (current goals can now be found in the care plan section) Progress towards PT goals: Progressing toward goals    Frequency    7X/week      PT Plan Current plan remains appropriate    Co-evaluation             End of Session Equipment Utilized During Treatment: Gait belt Activity Tolerance: Patient tolerated treatment well Patient left: in chair;with call bell/phone within reach     Time: 5027-7412 PT Time Calculation (min) (ACUTE ONLY): 21 min  Charges:  $Gait Training: 8-22 mins                    G Codes:      Shanna Cisco 05-16-2016, 3:59 PM  2016-05-16 [place], [place]

## 2016-04-22 NOTE — Progress Notes (Signed)
Subjective: 2 Days Post-Op Procedure(s) (LRB): IRRIGATION AND DEBRIDEMENT HIP WITH POLY EXCHANGE, ABX BEAD PLACEMENT, WOUND VAC  (Right) Patient reports pain as moderate.  Mobilizing slowly. Denies light-headedness. No new labs today.  Objective: Vital signs in last 24 hours: Temp:  [97.7 F (36.5 C)-98.2 F (36.8 C)] 97.7 F (36.5 C) (10/15 0419) Pulse Rate:  [43-47] 47 (10/15 0419) Resp:  [16] 16 (10/15 0419) BP: (110-118)/(38-40) 111/38 (10/15 0419) SpO2:  [100 %] 100 % (10/15 0419)  Intake/Output from previous day: 10/14 0701 - 10/15 0700 In: -  Out: 150 [Drains:150] Intake/Output this shift: No intake/output data recorded.   Recent Labs  04/20/16 1337  HGB 7.8*    Recent Labs  04/20/16 1337  WBC 6.5  RBC 2.86*  HCT 25.7*  PLT 207    Recent Labs  04/20/16 1337  NA 143  K 4.3  CL 115*  CO2 21*  BUN 27*  CREATININE 1.46*  GLUCOSE 109*  CALCIUM 8.7*   No results for input(s): LABPT, INR in the last 72 hours.  Sensation intact distally Intact pulses distally Dorsiflexion/Plantar flexion intact Incision: Incisional VAC with good seal  Assessment/Plan: 2 Days Post-Op Procedure(s) (LRB): IRRIGATION AND DEBRIDEMENT HIP WITH POLY EXCHANGE, ABX BEAD PLACEMENT, WOUND VAC  (Right) Up with therapy Plan for discharge tomorrow possibly.  Mcarthur Rossetti 04/22/2016, 9:51 AM

## 2016-04-22 NOTE — Progress Notes (Signed)
OT Cancellation Note and Discharge  Patient Details Name: Norma [name] MRN: [id number] DOB: 04-Nov-1945   Cancelled Treatment:    Reason Eval/Treat Not Completed: OT screened, no needs identified, will sign off. In to see pt for OT eval and she was quite familiar with what OT is. She reports that she does not need OT services here or while at home, she knows what to do to take care of herself and has all needed DME and AE. She feels she only needs HHPT and HHRN for bandage changes.  Almon Register [phone] 04/22/2016, 12:51 PM

## 2016-04-23 ENCOUNTER — Encounter (HOSPITAL_COMMUNITY)
Admission: RE | Disposition: A | Discharge status: Home Health | Payer: Self-pay | Source: Ambulatory Visit | Attending: Orthopedic Surgery

## 2016-04-23 ENCOUNTER — Encounter (HOSPITAL_COMMUNITY): Payer: Self-pay | Admitting: Orthopedic Surgery

## 2016-04-23 DIAGNOSIS — L02416 Cutaneous abscess of left lower limb: Secondary | ICD-10-CM

## 2016-04-23 HISTORY — PX: APPLICATION OF WOUND VAC: SHX5189

## 2016-04-23 HISTORY — PX: I&D EXTREMITY: SHX5045

## 2016-04-23 LAB — BASIC METABOLIC PANEL
Anion gap: 7 (ref 5–15)
BUN: 52 mg/dL — AB (ref 6–20)
CALCIUM: 8.1 mg/dL — AB (ref 8.9–10.3)
CO2: 21 mmol/L — AB (ref 22–32)
Chloride: 113 mmol/L — ABNORMAL HIGH (ref 101–111)
Creatinine, Ser: 2.95 mg/dL — ABNORMAL HIGH (ref 0.44–1.00)
GFR calc Af Amer: 17 mL/min — ABNORMAL LOW (ref >60)
GFR, EST NON AFRICAN AMERICAN: 15 mL/min — AB (ref >60)
GLUCOSE: 127 mg/dL — AB (ref 65–99)
Potassium: 5.2 mmol/L — ABNORMAL HIGH (ref 3.5–5.1)
Sodium: 141 mmol/L (ref 135–145)

## 2016-04-23 LAB — GLUCOSE, CAPILLARY
GLUCOSE-CAPILLARY: 166 mg/dL — AB (ref 65–99)
GLUCOSE-CAPILLARY: 113 mg/dL — AB (ref 65–99)
Glucose-Capillary: 115 mg/dL — ABNORMAL HIGH (ref 65–99)
Glucose-Capillary: 165 mg/dL — ABNORMAL HIGH (ref 65–99)

## 2016-04-23 LAB — SURGICAL PCR SCREEN
MRSA, PCR: NEGATIVE
Staphylococcus aureus: NEGATIVE

## 2016-04-23 SURGERY — IRRIGATION AND DEBRIDEMENT EXTREMITY
Anesthesia: General | Site: Leg Lower | Laterality: Right

## 2016-04-23 MED ORDER — CEFAZOLIN SODIUM-DEXTROSE 2-4 GM/100ML-% IV SOLN
2.0000 g | Freq: Two times a day (BID) | INTRAVENOUS | Status: DC
  Administered 2016-04-24 – 2016-04-26 (×6): 2 g via INTRAVENOUS
  Filled 2016-04-23 (×7): qty 100

## 2016-04-23 MED ORDER — SUCCINYLCHOLINE CHLORIDE 200 MG/10ML IV SOSY
PREFILLED_SYRINGE | INTRAVENOUS | Status: AC
  Filled 2016-04-23: qty 10

## 2016-04-23 MED ORDER — ONDANSETRON HCL 4 MG/2ML IJ SOLN
INTRAMUSCULAR | Status: AC
  Filled 2016-04-23: qty 2

## 2016-04-23 MED ORDER — PROPOFOL 10 MG/ML IV BOLUS
INTRAVENOUS | Status: AC
  Filled 2016-04-23: qty 20

## 2016-04-23 MED ORDER — SODIUM CHLORIDE 0.9 % IV SOLN
INTRAVENOUS | Status: AC
  Administered 2016-04-23 – 2016-04-24 (×2): via INTRAVENOUS

## 2016-04-23 MED ORDER — LIDOCAINE 2% (20 MG/ML) 5 ML SYRINGE
INTRAMUSCULAR | Status: AC
  Filled 2016-04-23: qty 5

## 2016-04-23 MED ORDER — FENTANYL CITRATE (PF) 100 MCG/2ML IJ SOLN
INTRAMUSCULAR | Status: AC
  Filled 2016-04-23: qty 2

## 2016-04-23 SURGICAL SUPPLY — 53 items
BANDAGE ACE 4X5 VEL STRL LF (GAUZE/BANDAGES/DRESSINGS) IMPLANT
BENZOIN TINCTURE PRP APPL 2/3 (GAUZE/BANDAGES/DRESSINGS) ×6 IMPLANT
BNDG COHESIVE 4X5 TAN STRL (GAUZE/BANDAGES/DRESSINGS) IMPLANT
BNDG GAUZE ELAST 4 BULKY (GAUZE/BANDAGES/DRESSINGS) IMPLANT
CANISTER WOUND CARE 500ML ATS (WOUND CARE) ×3 IMPLANT
COVER SURGICAL LIGHT HANDLE (MISCELLANEOUS) ×3 IMPLANT
CUFF TOURNIQUET SINGLE 18IN (TOURNIQUET CUFF) IMPLANT
CUFF TOURNIQUET SINGLE 24IN (TOURNIQUET CUFF) IMPLANT
CUFF TOURNIQUET SINGLE 34IN LL (TOURNIQUET CUFF) IMPLANT
CUFF TOURNIQUET SINGLE 44IN (TOURNIQUET CUFF) IMPLANT
DRAPE U-SHAPE 47X51 STRL (DRAPES) ×3 IMPLANT
DRSG PAD ABDOMINAL 8X10 ST (GAUZE/BANDAGES/DRESSINGS) IMPLANT
DRSG VAC ATS MED SENSATRAC (GAUZE/BANDAGES/DRESSINGS) ×3 IMPLANT
DURAPREP 26ML APPLICATOR (WOUND CARE) IMPLANT
ELECT REM PT RETURN 9FT ADLT (ELECTROSURGICAL)
ELECTRODE REM PT RTRN 9FT ADLT (ELECTROSURGICAL) IMPLANT
FACESHIELD WRAPAROUND (MASK) ×3 IMPLANT
GAUZE SPONGE 4X4 12PLY STRL (GAUZE/BANDAGES/DRESSINGS) ×3 IMPLANT
GAUZE XEROFORM 5X9 LF (GAUZE/BANDAGES/DRESSINGS) IMPLANT
GLOVE BIOGEL PI IND STRL 8 (GLOVE) ×4 IMPLANT
GLOVE BIOGEL PI INDICATOR 8 (GLOVE) ×2
GLOVE SURG ORTHO 8.0 STRL STRW (GLOVE) ×3 IMPLANT
GOWN STRL REUS W/ TWL LRG LVL3 (GOWN DISPOSABLE) ×4 IMPLANT
GOWN STRL REUS W/ TWL XL LVL3 (GOWN DISPOSABLE) ×2 IMPLANT
GOWN STRL REUS W/TWL LRG LVL3 (GOWN DISPOSABLE) ×2
GOWN STRL REUS W/TWL XL LVL3 (GOWN DISPOSABLE) ×1
HANDPIECE INTERPULSE COAX TIP (DISPOSABLE)
HIBICLENS 32OZ XXX (MISCELLANEOUS) ×3 IMPLANT
KIT BASIN OR (CUSTOM PROCEDURE TRAY) ×3 IMPLANT
KIT ROOM TURNOVER OR (KITS) ×3 IMPLANT
MANIFOLD NEPTUNE II (INSTRUMENTS) ×3 IMPLANT
NS IRRIG 1000ML POUR BTL (IV SOLUTION) ×3 IMPLANT
PACK ORTHO EXTREMITY (CUSTOM PROCEDURE TRAY) ×3 IMPLANT
PAD ARMBOARD 7.5X6 YLW CONV (MISCELLANEOUS) ×6 IMPLANT
PAD CAST 4YDX4 CTTN HI CHSV (CAST SUPPLIES) IMPLANT
PADDING CAST COTTON 4X4 STRL (CAST SUPPLIES)
SET HNDPC FAN SPRY TIP SCT (DISPOSABLE) IMPLANT
SPONGE LAP 18X18 X RAY DECT (DISPOSABLE) ×9 IMPLANT
SPONGE LAP 4X18 X RAY DECT (DISPOSABLE) ×3 IMPLANT
STOCKINETTE IMPERVIOUS 9X36 MD (GAUZE/BANDAGES/DRESSINGS) ×3 IMPLANT
SUT ETHILON 2 0 FS 18 (SUTURE) IMPLANT
SUT ETHILON 3 0 PS 1 (SUTURE) IMPLANT
SUT ETHILON 4 0 PS 2 18 (SUTURE) IMPLANT
SUT PROLENE 3 0 PS 2 (SUTURE) IMPLANT
SUT VIC AB 3-0 SH 27 (SUTURE)
SUT VIC AB 3-0 SH 27X BRD (SUTURE) IMPLANT
TOWEL OR 17X24 6PK STRL BLUE (TOWEL DISPOSABLE) ×3 IMPLANT
TOWEL OR 17X26 10 PK STRL BLUE (TOWEL DISPOSABLE) ×3 IMPLANT
TUBE ANAEROBIC SPECIMEN COL (MISCELLANEOUS) IMPLANT
TUBE CONNECTING 12X1/4 (SUCTIONS) ×3 IMPLANT
UNDERPAD 30X30 (UNDERPADS AND DIAPERS) ×3 IMPLANT
WATER STERILE IRR 1000ML POUR (IV SOLUTION) ×3 IMPLANT
YANKAUER SUCT BULB TIP NO VENT (SUCTIONS) ×3 IMPLANT

## 2016-04-23 NOTE — Progress Notes (Signed)
Physical Therapy Treatment Patient Details Name: Norma [name] MRN: [id number] DOB: 02-08-1946 Today's Date: 04/23/2016    History of Present Illness Patient had a THA right on 03/06/2016. She was home and doing well until she ended up with an infection. She had an I and D and a wound vac placed on 10/13. PMH includes: DDD, HTN, DMII, coratid occlusion, gout, anxiety, DDD, CKD      PT Comments    Pt limited during mobility secondary to dizziness after 35 feet of gait.  Pt became pale and required assist from nurse to bring chair for patient to sit.  Will f/u in am.  Pt to go for second surgery this pm.  Vitals stable.    Follow Up Recommendations  Home health PT     Equipment Recommendations  None recommended by PT    Recommendations for Other Services       Precautions / Restrictions Precautions Precautions: None (direct anterior hip ) Precaution Comments: wound vac to R hip  Restrictions Weight Bearing Restrictions: Yes RLE Weight Bearing: Weight bearing as tolerated    Mobility  Bed Mobility               General bed mobility comments: in recliner when PT arrived  Transfers Overall transfer level: Needs assistance Equipment used: Rolling walker (2 wheeled) Transfers: Sit to/from Stand Sit to Stand: Modified independent (Device/Increase time);Min assist (mod Independent initially, presents with dizziness and require min assist for safety.  )         General transfer comment: Cues for safety during stand to sit after feeling dizzy.    Ambulation/Gait Ambulation/Gait assistance: Supervision;Min guard Ambulation Distance (Feet): 35 Feet (limited due to dizziness, recliner brought to patient and BP obtained 136/40, HR 68 bpm.  ) Assistive device: Rolling walker (2 wheeled) Gait Pattern/deviations: Step-to pattern;Trunk flexed;Shuffle Gait velocity: decreased   General Gait Details: Cues for line lead management and endurance/ pacing.  Pt limited due to  dizziness.     Stairs            Wheelchair Mobility    Modified Rankin (Stroke Patients Only)       Balance Overall balance assessment: Needs assistance   Sitting balance-Leahy Scale: Good       Standing balance-Leahy Scale: Poor                      Cognition Arousal/Alertness: Awake/alert Behavior During Therapy: WFL for tasks assessed/performed Overall Cognitive Status: Within Functional Limits for tasks assessed                      Exercises      General Comments        Pertinent Vitals/Pain Pain Assessment: 0-10 Pain Score: 5  Pain Location: R hip  Pain Descriptors / Indicators: Aching Pain Intervention(s): Monitored during session;Repositioned    Home Living                      Prior Function            PT Goals (current goals can now be found in the care plan section) Acute Rehab PT Goals Patient Stated Goal: To go home  Potential to Achieve Goals: Good Progress towards PT goals: Progressing toward goals    Frequency    7X/week      PT Plan Current plan remains appropriate    Co-evaluation  End of Session Equipment Utilized During Treatment: Gait belt Activity Tolerance: Patient tolerated treatment well Patient left: in chair;with call bell/phone within reach     Time: 1353-1413 PT Time Calculation (min) (ACUTE ONLY): 20 min  Charges:  $Gait Training: 8-22 mins $Therapeutic Activity: 8-22 mins                    G Codes:      Cristela Blue 05-23-16, 2:51 PM  Governor Rooks, PTA pager [phone]

## 2016-04-23 NOTE — Progress Notes (Signed)
Visited with patient, who is eager to be discharged tomorrow. 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 A woman of strong faith, patient's attitude toward her present health challenges is positive. Prayers of thanksgiving were offered with patient and staff. Chaplain available for follow-up.    04/23/16 1700  Clinical Encounter Type  Visited With Patient  Visit Type Initial;Spiritual support  Referral From Chaplain  Spiritual Encounters  Spiritual Needs Prayer;Emotional  Stress Factors  Patient Stress Factors Health changes

## 2016-04-23 NOTE — Progress Notes (Signed)
PT Cancellation Note  Patient Details Name: Norma [name] MRN: [id number] DOB: 1945-12-24   Cancelled Treatment:    Reason Eval/Treat Not Completed:  (Pt eating lunch will f/u after.  )   Cristela Blue 04/23/2016, 12:53 PM  Governor Rooks, PTA pager [phone]

## 2016-04-23 NOTE — Anesthesia Preprocedure Evaluation (Signed)
Anesthesia Evaluation  Patient identified by MRN, date of birth, ID band Patient awake    Reviewed: Allergy & Precautions, NPO status , Patient's Chart, lab work & pertinent test results, reviewed documented beta blocker date and time   Airway Mallampati: III  TM Distance: >3 FB Neck ROM: Full    Dental  (+) Teeth Intact, Dental Advisory Given   Pulmonary former smoker,    breath sounds clear to auscultation       Cardiovascular hypertension, Pt. on home beta blockers and Pt. on medications + CAD, + CABG and + Peripheral Vascular Disease   Rhythm:Regular Rate:Normal     Neuro/Psych PSYCHIATRIC DISORDERS Anxiety negative neurological ROS     GI/Hepatic negative GI ROS, Neg liver ROS,   Endo/Other  diabetes, Type 2, Insulin Dependent  Renal/GU CRFRenal disease  negative genitourinary   Musculoskeletal  (+) Arthritis ,   Abdominal (+) + obese,   Peds negative pediatric ROS (+)  Hematology negative hematology ROS (+)   Anesthesia Other Findings   Reproductive/Obstetrics negative OB ROS                             Lab Results  Component Value Date   WBC 6.5 04/20/2016   HGB 7.8 (L) 04/20/2016   HCT 25.7 (L) 04/20/2016   MCV 89.9 04/20/2016   PLT 207 04/20/2016   Lab Results  Component Value Date   INR 1.09 01/18/2015   INR 1.20 05/28/2011   INR 1.20 05/27/2011   - Left ventricle: The cavity size was normal. Wall thickness was increased in a pattern of severe LVH. Systolic function was normal. The estimated ejection fraction was in the range of 60% to 65%. Wall motion was normal; there were no regional wall motion abnormalities. Doppler parameters are consistent with abnormal left ventricular relaxation (grade 1 diastolic dysfunction). Doppler parameters are consistent with high ventricular filling pressure. - Mitral valve: Calcified annulus. - Left atrium: The  atrium was moderately dilated. - Pulmonary arteries: Systolic pressure was moderately increased. PA peak pressure: 66mm Hg (S). - Pericardium, extracardiac: A trivial pericardial effusion was identified.  Anesthesia Physical  Anesthesia Plan  ASA: III  Anesthesia Plan: General   Post-op Pain Management:    Induction: Intravenous  Airway Management Planned: LMA  Additional Equipment:   Intra-op Plan:   Post-operative Plan: Extubation in OR  Informed Consent: I have reviewed the patients History and Physical, chart, labs and discussed the procedure including the risks, benefits and alternatives for the proposed anesthesia with the patient or authorized representative who has indicated his/her understanding and acceptance.     Plan Discussed with: CRNA  Anesthesia Plan Comments: (Pt declines spinal anesthesia. )        Anesthesia Quick Evaluation

## 2016-04-23 NOTE — Progress Notes (Signed)
Plan tonight for change of the vERa flow wound VAC to regular wound VAC Creatinine elevated Will discontinue vanc Plan to use Ancef transitioning to oral Keflex for Proteus and Escherichia coli infection susceptible to Ancef Anticipate discharge tomorrow with home health physical therapy for wound VAC changes 3 times a week

## 2016-04-24 ENCOUNTER — Encounter (HOSPITAL_COMMUNITY): Payer: Self-pay | Admitting: *Deleted

## 2016-04-24 ENCOUNTER — Inpatient Hospital Stay (HOSPITAL_COMMUNITY): Payer: Medicare Other | Admitting: Certified Registered Nurse Anesthetist

## 2016-04-24 DIAGNOSIS — T8149XA Infection following a procedure, other surgical site, initial encounter: Secondary | ICD-10-CM | POA: Diagnosis present

## 2016-04-24 LAB — BASIC METABOLIC PANEL
Anion gap: 6 (ref 5–15)
BUN: 48 mg/dL — AB (ref 6–20)
CALCIUM: 8.4 mg/dL — AB (ref 8.9–10.3)
CO2: 23 mmol/L (ref 22–32)
CREATININE: 2.69 mg/dL — AB (ref 0.44–1.00)
Chloride: 113 mmol/L — ABNORMAL HIGH (ref 101–111)
GFR calc Af Amer: 20 mL/min — ABNORMAL LOW (ref >60)
GFR calc non Af Amer: 17 mL/min — ABNORMAL LOW (ref >60)
GLUCOSE: 101 mg/dL — AB (ref 65–99)
Potassium: 4.8 mmol/L (ref 3.5–5.1)
Sodium: 142 mmol/L (ref 135–145)

## 2016-04-24 LAB — GLUCOSE, CAPILLARY
GLUCOSE-CAPILLARY: 92 mg/dL (ref 65–99)
Glucose-Capillary: 113 mg/dL — ABNORMAL HIGH (ref 65–99)
Glucose-Capillary: 70 mg/dL (ref 65–99)

## 2016-04-24 MED ORDER — MEPERIDINE HCL 25 MG/ML IJ SOLN: 6.2500 mg | INTRAMUSCULAR | Status: DC | PRN

## 2016-04-24 MED ORDER — ACETAMINOPHEN 325 MG PO TABS: 650.0000 mg | ORAL_TABLET | Freq: Four times a day (QID) | ORAL | Status: DC | PRN

## 2016-04-24 MED ORDER — ONDANSETRON HCL 4 MG PO TABS: 4.0000 mg | ORAL_TABLET | Freq: Four times a day (QID) | ORAL | Status: DC | PRN

## 2016-04-24 MED ORDER — PROMETHAZINE HCL 25 MG/ML IJ SOLN: 6.2500 mg | INTRAMUSCULAR | Status: DC | PRN

## 2016-04-24 MED ORDER — LACTATED RINGERS IV SOLN
INTRAVENOUS | Status: DC | PRN
  Administered 2016-04-24: via INTRAVENOUS

## 2016-04-24 MED ORDER — METOCLOPRAMIDE HCL 5 MG/ML IJ SOLN: 5.0000 mg | Freq: Three times a day (TID) | INTRAMUSCULAR | Status: DC | PRN

## 2016-04-24 MED ORDER — SODIUM CHLORIDE 0.9 % IV SOLN: INTRAVENOUS | Status: AC

## 2016-04-24 MED ORDER — LIDOCAINE 2% (20 MG/ML) 5 ML SYRINGE
INTRAMUSCULAR | Status: DC | PRN
  Administered 2016-04-24: 50 mg via INTRAVENOUS

## 2016-04-24 MED ORDER — ONDANSETRON HCL 4 MG/2ML IJ SOLN: 4.0000 mg | Freq: Four times a day (QID) | INTRAMUSCULAR | Status: DC | PRN

## 2016-04-24 MED ORDER — METOCLOPRAMIDE HCL 5 MG PO TABS: 5.0000 mg | ORAL_TABLET | Freq: Three times a day (TID) | ORAL | Status: DC | PRN

## 2016-04-24 MED ORDER — PROPOFOL 10 MG/ML IV BOLUS
INTRAVENOUS | Status: DC | PRN
  Administered 2016-04-24: 140 mg via INTRAVENOUS

## 2016-04-24 MED ORDER — SODIUM CHLORIDE 0.9 % IR SOLN
Status: DC | PRN
  Administered 2016-04-24: 3000 mL

## 2016-04-24 MED ORDER — FENTANYL CITRATE (PF) 100 MCG/2ML IJ SOLN
INTRAMUSCULAR | Status: DC | PRN
  Administered 2016-04-24: 50 ug via INTRAVENOUS

## 2016-04-24 MED ORDER — LACTATED RINGERS IV SOLN
INTRAVENOUS | Status: DC
  Administered 2016-04-24: 02:00:00 via INTRAVENOUS

## 2016-04-24 MED ORDER — FENTANYL CITRATE (PF) 100 MCG/2ML IJ SOLN: 25.0000 ug | INTRAMUSCULAR | Status: DC | PRN

## 2016-04-24 MED ORDER — ACETAMINOPHEN 650 MG RE SUPP: 650.0000 mg | Freq: Four times a day (QID) | RECTAL | Status: DC | PRN

## 2016-04-24 MED ORDER — EPHEDRINE SULFATE 50 MG/ML IJ SOLN
INTRAMUSCULAR | Status: DC | PRN
  Administered 2016-04-24: 10 mg via INTRAVENOUS

## 2016-04-24 MED ORDER — GLYCOPYRROLATE 0.2 MG/ML IJ SOLN
INTRAMUSCULAR | Status: DC | PRN
  Administered 2016-04-24 (×3): 0.2 mg via INTRAVENOUS

## 2016-04-24 NOTE — Care Management Important Message (Signed)
Important Message  Patient Details  Name: Norma [name] MRN: [id number] Date of Birth: 02-28-1946   Medicare Important Message Given:  Yes    Norma [name] 04/24/2016, 11:11 AM

## 2016-04-24 NOTE — Care Management Note (Signed)
Case Management Note  Patient Details  Name: Norma [name] MRN: [id number] Date of Birth: [birth year]  Subjective/Objective:    70 yr old female admitted with right hip arthroplasty infection. Patient has had I&D x 2 with wound vac application.               Action/Plan: Case manager spoke with patient at length on 04/23/16 concerning need for Home Health RN for wound Vac dressing changes. CM explained process to patient and discussed wound vac particularly. Patient states she lives alone but has family and friends that check on her frequently. Choice was offered for [place]. Referral was called to Santiago Glad, [facility] Liaison. Case manager has faxed wound Vac application to [employer], [facility] [employer] CM will continue to monitor.  Expected Discharge Date:   to be determined               Expected Discharge Plan:  [place]  In-House Referral:     Discharge planning Services  CM Consult  Post Acute Care Choice:  Durable Medical Equipment, Home Health Choice offered to:  Patient  DME Arranged:  Vac DME Agency:  [facility]  HH Arranged:  RN [place] Agency:  [employer]  Status of Service:  In process, will continue to follow  If discussed at Long Length of Stay Meetings, dates discussed:    Additional Comments:  Ninfa Meeker, RN 04/24/2016, 2:44 PM

## 2016-04-24 NOTE — Progress Notes (Signed)
Physical Therapy Treatment Patient Details Name: Norma [name] MRN: [id number] DOB: Feb 04, 1946 Today's Date: 04/24/2016    History of Present Illness Patient had a THA right on 03/06/2016. She was home and doing well until she ended up with an infection. She had an I and D and a wound vac placed on 10/13. PMH includes: DDD, HTN, DMII, coratid occlusion, gout, anxiety, DDD, CKD      PT Comments    Pt performed increased gait with complaints of low back pain.  Pt progressing slowly and apprehensive to work with therapy but agreeable after pain meds were administered.    Follow Up Recommendations  Home health PT     Equipment Recommendations  None recommended by PT    Recommendations for Other Services       Precautions / Restrictions Precautions Precautions: None (direct anterior hip.  ) Precaution Comments: wound vac to R hip  Restrictions Weight Bearing Restrictions: Yes RLE Weight Bearing: Weight bearing as tolerated    Mobility  Bed Mobility Overal bed mobility: Needs Assistance Bed Mobility: Supine to Sit     Supine to sit: Min assist     General bed mobility comments: in recliner when PT arrived.    Transfers Overall transfer level: Needs assistance Equipment used: Rolling walker (2 wheeled) Transfers: Sit to/from [facility] Sit to Stand: Supervision Stand pivot transfers: Min guard       General transfer comment: Cues for sequencing and hand placement.    Ambulation/Gait Ambulation/Gait assistance: Supervision;Min guard Ambulation Distance (Feet): 80 Feet Assistive device: Rolling walker (2 wheeled) Gait Pattern/deviations: Step-through pattern;Trunk flexed;Antalgic Gait velocity: decreased   General Gait Details: Pt denies dizziness but perseverated on low back pain, bending forward and reaching for railing in halls.  PTA instructed patient this was not safe but pt remains to lean on rails.  Pt able to progress gait distance during  tx.     Stairs            Wheelchair Mobility    Modified Rankin (Stroke Patients Only)       Balance Overall balance assessment: Needs assistance   Sitting balance-Leahy Scale: Good       Standing balance-Leahy Scale: Poor Standing balance comment: needs RW                    Cognition Arousal/Alertness: Awake/alert Behavior During Therapy: WFL for tasks assessed/performed Overall Cognitive Status: Within Functional Limits for tasks assessed                      Exercises      General Comments        Pertinent Vitals/Pain Pain Assessment: 0-10 Pain Score: 7  Pain Location: back pain, patient leaning in halls to grab rails reports it alleviate pain in back.   Pain Descriptors / Indicators: Aching Pain Intervention(s): Monitored during session;Repositioned;Ice applied    Home Living                      Prior Function            PT Goals (current goals can now be found in the care plan section) Acute Rehab PT Goals Patient Stated Goal: To go home  Potential to Achieve Goals: Good Progress towards PT goals: Progressing toward goals    Frequency    7X/week      PT Plan Current plan remains appropriate    Co-evaluation  End of Session Equipment Utilized During Treatment: Gait belt Activity Tolerance: Patient tolerated treatment well Patient left: in chair;with call bell/phone within reach     Time: 1530-1553 PT Time Calculation (min) (ACUTE ONLY): 23 min  Charges:  $Gait Training: 8-22 mins $Therapeutic Activity: 8-22 mins                    G Codes:      Cristela Blue 05-17-16, 4:00 PM  Governor Rooks, PTA pager [phone]

## 2016-04-24 NOTE — Anesthesia Postprocedure Evaluation (Signed)
Anesthesia Post Note  Patient: Penda Gentry Fitz  Procedure(s) Performed: Procedure(s) (LRB): IRRIGATION AND DEBRIDEMENT EXTREMITY (Right) APPLICATION OF WOUND VAC CHANGED (Right)  Patient location during evaluation: PACU Anesthesia Type: General Level of consciousness: awake and alert Pain management: pain level controlled Vital Signs Assessment: post-procedure vital signs reviewed and stable Respiratory status: spontaneous breathing, nonlabored ventilation, respiratory function stable and patient connected to nasal cannula oxygen Cardiovascular status: blood pressure returned to baseline and stable Postop Assessment: no signs of nausea or vomiting Anesthetic complications: no    Last Vitals:  Vitals:   04/24/16 0159 04/24/16 0500  BP: (!) 132/36 (!) 159/35  Pulse: (!) 44 (!) 45  Resp: 16 16  Temp: 36.4 C 36.5 C    Last Pain:  Vitals:   04/24/16 0500  TempSrc: Oral  PainSc:                  Effie Berkshire

## 2016-04-24 NOTE — Transfer of Care (Signed)
Immediate Anesthesia Transfer of Care Note  Patient: Norma [name]  Procedure(s) Performed: Procedure(s): IRRIGATION AND DEBRIDEMENT EXTREMITY (Right) APPLICATION OF WOUND VAC CHANGED (Right)  Patient Location: PACU  Anesthesia Type:General  Level of Consciousness: awake, alert  and oriented  Airway & Oxygen Therapy: Patient Spontanous Breathing  Post-op Assessment: Report given to RN and Post -op Vital signs reviewed and stable  Post vital signs: Reviewed and stable  Last Vitals:  Vitals:   04/23/16 2100 04/24/16 0108  BP: (!) 161/44   Pulse: (!) 41 (!) 50  Resp: 15 (!) 23  Temp: 37.1 C (P) 36.1 C    Last Pain:  Vitals:   04/23/16 2101  TempSrc:   PainSc: 0-No pain      Patients Stated Pain Goal: 1 (20/99/06 8934)  Complications: No apparent anesthesia complications

## 2016-04-24 NOTE — Progress Notes (Signed)
PT Cancellation Note  Patient Details Name: MAJESTA LEICHTER MRN: [id number] DOB: [birth year]   Cancelled Treatment:    Reason Eval/Treat Not Completed: Pain limiting ability to participate (Pt reports increased pain from procedure this am and refused tx, RN to adminster meds and will f/u later if patient is agreeable.  )   Cristela Blue 04/24/2016, 2:37 PM Governor Rooks, PTA pager [phone]

## 2016-04-24 NOTE — Brief Op Note (Signed)
04/20/2016 - 04/24/2016  1:09 AM  PATIENT:  Norma [name]  70 y.o. female  PRE-OPERATIVE DIAGNOSIS:  Right Leg Wound debridement  POST-OPERATIVE DIAGNOSIS:  Right Leg Wound debridement  PROCEDURE:  Procedure(s): IRRIGATION AND DEBRIDEMENT EXTREMITY APPLICATION OF WOUND VAC CHANGED  SURGEON:  Surgeon(s): Meredith Pel, MD  ASSISTANT:none  ANESTHESIA:   general  EBL:205 ml    Total I/O In: 700 [I.V.:700] Out: 20 [Blood:20]  BLOOD ADMINISTERED: none  DRAINS: none   LOCAL MEDICATIONS USED:  none  SPECIMEN:  No Specimen  COUNTS:  YES  TOURNIQUET:  * No tourniquets in log *  DICTATION: .Other Dictation: Dictation Number [id number]  PLAN OF CARE: Admit to inpatient   PATIENT DISPOSITION:  PACU - hemodynamically stable

## 2016-04-24 NOTE — Op Note (Signed)
NAME:  Norma [name], Norma [name] NO.:  [id number]  MEDICAL RECORD NO.:  [id number]  LOCATION:  5N31C                        FACILITY:  [facility]  PHYSICIAN:  Anderson Malta, M.D.    DATE OF BIRTH:  [birth year]  DATE OF PROCEDURE: DATE OF DISCHARGE:                              OPERATIVE REPORT   PREOPERATIVE DIAGNOSIS:  Right hip superficial infection.  POSTOPERATIVE DIAGNOSIS:  Right hip superficial infection.  PROCEDURE PERFORMED:  Right hip superficial infection debridement with change of VeraFlo wound VAC to regular wound VAC.  SURGEON:  Anderson Malta, M.D.  ASSISTANT:  None.  ANESTHESIA:  General.  INDICATIONS:  [name] is a 70 year old patient with right hip superficial infection, presents for operative management and change of wound VAC to regular wound VAC.  DESCRIPTION OF PROCEDURE:  The patient was brought to the operating room, where general anesthetic was induced.  Preoperative antibiotics were continued. Time-out was called.  VeraFlo wound VAC was removed.  In general, the tissue bed looked healthy and contracted.  Dead space was at a minimum.  Excisional debridement was performed of devitalized appearing skin, subcutaneous tissue, and fascia.  This was done with a curette as well as sharply.  Thorough irrigation with 3 liters of irrigating solution was performed.  Wound VAC was then placed, which was a normal wound VAC.  The patient tolerated the procedure well without immediate complication.  Transferred to the recovery room in stable condition.     Anderson Malta, M.D.     GSD/MEDQ  D:  04/24/2016  T:  04/24/2016  Job:  [id number]

## 2016-04-24 NOTE — Anesthesia Procedure Notes (Signed)
Procedure Name: LMA Insertion Date/Time: 04/24/2016 12:30 AM Performed by: Manuela Schwartz B Pre-anesthesia Checklist: Patient identified, Emergency Drugs available, Suction available, Patient being monitored and Timeout performed Patient Re-evaluated:Patient Re-evaluated prior to inductionOxygen Delivery Method: Circle system utilized Intubation Type: IV induction LMA: LMA inserted LMA Size: 4.0 Number of attempts: 1 Placement Confirmation: positive ETCO2 and breath sounds checked- equal and bilateral Tube secured with: Tape Dental Injury: Teeth and Oropharynx as per pre-operative assessment

## 2016-04-24 NOTE — Clinical Social Work Note (Signed)
CSW consulted for New SNF. PT is recommending HHPT. CSW consulted with RNCM. RNCM is following for discharge planning needs. CSW is signing off as no further needs identified.   Darden Dates, MSW, LCSW Clinical Social Worker  [phone]

## 2016-04-25 LAB — AEROBIC/ANAEROBIC CULTURE W GRAM STAIN (SURGICAL/DEEP WOUND)

## 2016-04-25 LAB — AEROBIC/ANAEROBIC CULTURE (SURGICAL/DEEP WOUND)

## 2016-04-25 MED ORDER — INSULIN GLARGINE 100 UNIT/ML ~~LOC~~ SOLN: 20.0000 [IU] | Freq: Two times a day (BID) | SUBCUTANEOUS | 11 refills | Status: DC

## 2016-04-25 MED ORDER — CEPHALEXIN 500 MG PO CAPS: 500.0000 mg | ORAL_CAPSULE | Freq: Three times a day (TID) | ORAL | 1 refills | Status: DC

## 2016-04-25 MED ORDER — MENTHOL 3 MG MT LOZG: 1.0000 | LOZENGE | OROMUCOSAL | 12 refills | Status: DC | PRN

## 2016-04-25 MED ORDER — ONDANSETRON HCL 4 MG PO TABS: 4.0000 mg | ORAL_TABLET | Freq: Four times a day (QID) | ORAL | 0 refills | Status: DC | PRN

## 2016-04-25 MED ORDER — HYDROCODONE-ACETAMINOPHEN 10-325 MG PO TABS: 1.0000 | ORAL_TABLET | Freq: Four times a day (QID) | ORAL | 0 refills | Status: DC | PRN

## 2016-04-25 MED ORDER — ASPIRIN 325 MG PO TBEC: 325.0000 mg | DELAYED_RELEASE_TABLET | Freq: Every day | ORAL | 0 refills | Status: DC

## 2016-04-25 NOTE — Progress Notes (Signed)
Pt stable Cr improving Off vanc - on ancef Needs wound vac change tomorrow and dc home tomorrow with hhc for sat tues thurs wound vac changes rx on chart

## 2016-04-25 NOTE — Consult Note (Signed)
WOC consult requested for Vac dressing change on Thursday, will plan to change tomorrow am. Julien Girt MSN, RN, [place], [place], [place]

## 2016-04-25 NOTE — Progress Notes (Signed)
PT Cancellation Note  Patient Details Name: Norma [name] MRN: [id number] DOB: 12/04/1945   Cancelled Treatment:    Reason Eval/Treat Not Completed: Other (comment) (Pt refused PT treatment due to visitor arriving and wishing to talk with her visitor instead of performing therapy treatment.  Will f/u if time permits.  )   Keandria Berrocal Eli Hose 04/25/2016, 2:27 PM Governor Rooks, PTA pager [phone]

## 2016-04-26 ENCOUNTER — Encounter (HOSPITAL_COMMUNITY): Payer: Self-pay | Admitting: *Deleted

## 2016-04-26 NOTE — Progress Notes (Signed)
PT Cancellation Note  Patient Details Name: Norma [name] MRN: [id number] DOB: 04-22-1946   Cancelled Treatment:    Reason Eval/Treat Not Completed: Patient at procedure or test/unavailable;Other (comment) (Pt currently getting wound vac changed, patient reports, "Don't come back.  I ain't doing it. I ain't walking today.")   Mykai Wendorf Eli Hose 04/26/2016, 10:19 AM Governor Rooks, PTA pager [phone]

## 2016-04-26 NOTE — Care Management Note (Signed)
Case Management Note  Patient Details  Name: EVALYSE STROOPE MRN: [id number] Date of Birth: [birth year]  Subjective/Objective:                    Action/Plan: CM met with the patient this am. Pt did not have home vac in the room. CM called and spoke to [employer] and she stated the vac would be delivered around 11 am. CM informed RN and [place] RN.   Expected Discharge Date:                  Expected Discharge Plan:  [place]  In-House Referral:     Discharge planning Services  CM Consult  Post Acute Care Choice:  Durable Medical Equipment, Home Health Choice offered to:  Patient  DME Arranged:  Vac DME Agency:  [facility]  HH Arranged:  RN, PT [place] Agency:  [facility]  Status of Service:  Completed, signed off  If discussed at [place] of Stay Meetings, dates discussed:    Additional Comments:  Pollie Friar, RN 04/26/2016, 12:24 PM

## 2016-04-26 NOTE — Progress Notes (Signed)
Pt ready for discharge. Education/instructions reviewed with pt and all questions/concerns addressed. IV removed and belongings gathered. Pt will be transported out via wheelchair to daughter's vehicle. Will continue to monitor

## 2016-04-26 NOTE — Progress Notes (Signed)
Patient is stable with VAC. VAC change today by RN then dc home. Rx in chart

## 2016-04-26 NOTE — Consult Note (Signed)
[place] Nurse wound consult note Reason for Consult: Consult requested for Vac dressing change to right hip, pt is followed by the ortho service Wound type: Full thickness post-op wound Measurement: 15X6X3.5cm with 1 cm undermining to wound edges Wound bed: beefy red with white antibiotic beads visible in the wound bed Drainage (amount, consistency, odor) small amt bloody drainage when the dressing was changed, mod amt red drainage in the cannister.  No active bleeding Periwound: Intact skin surrounding Dressing procedure/placement/frequency: Pt medicated for pain prior to procedure.  Applied one piece black foam and she tolerated with mod amt pain.  Cont suction on at 51mm.  Pt awaiting delivery of home Vac machine prior to discharge home.  Home health can change dressing Q Tues/Thurs/Sat. And patient can follow up with the ortho service aftwer discharge. Please re-consult if further assistance is needed.  Thank-you,  Julien Girt MSN, [place], [place], [place], [place]

## 2016-04-30 ENCOUNTER — Ambulatory Visit (INDEPENDENT_AMBULATORY_CARE_PROVIDER_SITE_OTHER): Payer: Medicare Other | Admitting: Orthopedic Surgery

## 2016-04-30 ENCOUNTER — Telehealth (INDEPENDENT_AMBULATORY_CARE_PROVIDER_SITE_OTHER): Payer: Self-pay | Admitting: Radiology

## 2016-04-30 DIAGNOSIS — T8459XD Infection and inflammatory reaction due to other internal joint prosthesis, subsequent encounter: Secondary | ICD-10-CM

## 2016-04-30 DIAGNOSIS — Z96651 Presence of right artificial knee joint: Secondary | ICD-10-CM | POA: Insufficient documentation

## 2016-04-30 DIAGNOSIS — Z96659 Presence of unspecified artificial knee joint: Secondary | ICD-10-CM

## 2016-04-30 MED ORDER — DOXYCYCLINE HYCLATE 100 MG PO CAPS: 100.0000 mg | ORAL_CAPSULE | Freq: Two times a day (BID) | ORAL | 0 refills | Status: AC

## 2016-04-30 MED ORDER — DOXYCYCLINE HYCLATE 100 MG PO TABS: 100.0000 mg | ORAL_TABLET | Freq: Two times a day (BID) | ORAL | Status: DC

## 2016-04-30 NOTE — Telephone Encounter (Signed)
Patient called states she has an appt this afternoon with Dr. Marlou Sa but needs to talk to you this morning before she comes for her appt. Please call patient [phone]. asap. Thanks.

## 2016-04-30 NOTE — Progress Notes (Signed)
Office Visit Note  Postop, s/p I&D right hip wound, abx placement, and wound vac on 04/20/16, 10 days out.  Wound vac dressing removed.  Patient states she is in a lot of pain, cries a lot, and the Keflex/antibiotic is causing n/v/d.  HHRN is to come see her next on Wed. 05/02/16. Patient brought wound vac supplies in today for re application.    Patient: Norma [name]           Date of Birth: 1946/02/22           MRN: [id number] Visit Date: 04/30/2016              Requested by: [place], MD [address] [place], [place] [address] PCP: Bartholome Bill, MD   Assessment & Plan: Visit Diagnoses:  1. Infected prosthetic knee joint, subsequent encounter   2. Presence of right artificial knee joint     Plan: Roselia comes in today having had superficial infection on the right hip.  Wound VAC is removed.  Healthy-appearing granulation tissue is present but the defect is large.  Measures about 10 x 4 cm.  Antibiotic beads are present.  Plan at this time is to continue the wound VAC.  She is having some symptoms of nausea with the current medication which is Keflex.  Plan is to change her to doxycycline.  Continue the wound VAC for 2 more weeks we'll see her back at that time for clinical recheck.  Follow-Up Instructions: No Follow-up on file.   Orders:  No orders of the defined types were placed in this encounter.  No orders of the defined types were placed in this encounter.     Procedures: No procedures performed   Clinical Data: No additional findings.   Subjective: Chief Complaint  Patient presents with  . Right Hip - Routine Post Op, Open Wound    HPI  Review of Systems   Objective: Vital Signs: There were no vitals taken for this visit.  Physical Exam  Ortho Exam  Specialty Comments:  No specialty comments available.  Imaging: No results found.   PMFS History: Patient Active Problem List   Diagnosis  Date Noted  . Presence of right artificial knee joint 04/30/2016  . Postoperative wound infection of right hip 04/24/2016  . Prosthetic hip infection ([place]) 04/20/2016  . Degenerative arthritis of hip 03/06/2016  . Gout of left knee 10/31/2015  . Right hip pain 10/29/2015  . DM type 2 causing CKD stage 3 ([place]) 10/29/2015  . Morbid obesity ([place]) 10/29/2015  . CKD (chronic kidney disease) stage 4, GFR 15-29 ml/min ([facility]) 04/18/2014  . Aortic atherosclerosis ([place]) 04/18/2014  . Bilateral carotid bruits 04/16/2014  . Pulsatile tinnitus of right ear 04/16/2014  . Diarrhea 09/18/2011  . Peripheral vascular disease, unspecified 08/14/2011  . Claudication ([place]) 08/14/2011  . Edema 06/29/2011  . Leucocytosis 06/12/2011  . Acute on chronic kidney disease, stage 4 05/28/2011  . Respiratory failure, acute ([place]) 05/25/2011    Class: Acute  . S/P aorto-bifemoral bypass surgery 05/25/2011    Class: Acute  . PAD (peripheral artery disease) ([place])   . Coronary artery disease   . Hyperlipidemia   . Hypertension    Past Medical History:  Diagnosis Date  . Anxiety   . Carotid artery occlusion   . CKD (chronic kidney disease) stage 3, GFR 30-59 ml/min 04/18/2014  . Claudication ([place])   . Constipation    Due to patient  taking iron supplements  . Coronary artery disease   . DDD (degenerative disc disease)   . Diabetes mellitus   . Diabetic coma ([place])   . DJD (degenerative joint disease)   . DJD (degenerative joint disease)   . Edema    Bilateral lower extermities  . Gout   . Hyperlipidemia   . Hypertension   . Iron deficiency anemia   . Leg pain   . Obesity   . PAD (peripheral artery disease) ([place])   . Shortness of breath    exertion    Family History  Problem Relation Age of Onset  . Hypertension Mother   . Coronary artery disease Father   . Hypertension Sister   . Cirrhosis Brother   . Kidney disease Daughter     Past Surgical History:  Procedure Laterality Date  . AORTA -  BILATERAL FEMORAL ARTERY BYPASS GRAFT  05/25/2011   Procedure: AORTA BIFEMORAL BYPASS GRAFT;  Surgeon: Mal Misty, MD;  Location: [facility];  Service: Vascular;  Laterality: N/A;  . APPLICATION OF WOUND VAC Right 04/23/2016   Procedure: APPLICATION OF WOUND VAC CHANGED;  Surgeon: Meredith Pel, MD;  Location: [facility];  Service: Orthopedics;  Laterality: Right;  . BREAST SURGERY    . CARDIAC CATHETERIZATION  12/01/2009   EF 65%  . CARDIOVASCULAR STRESS TEST  11/28/2009   EF 75%  . COLONOSCOPY W/ POLYPECTOMY    . CORONARY ARTERY BYPASS GRAFT  12/08/2009   LIMA GRAFT TO THE DISTAL LAD AND SAPHENOUS VEIN GRAFT TO THE OBTUSE MARGINAL VESSEL  . EYE SURGERY    . I&D EXTREMITY Right 04/23/2016   Procedure: IRRIGATION AND DEBRIDEMENT EXTREMITY;  Surgeon: Meredith Pel, MD;  Location: [facility];  Service: Orthopedics;  Laterality: Right;  . INCISION AND DRAINAGE HIP Right 04/20/2016   Procedure: IRRIGATION AND DEBRIDEMENT HIP WITH POLY EXCHANGE, ABX BEAD PLACEMENT, WOUND VAC ;  Surgeon: Meredith Pel, MD;  Location: [facility];  Service: Orthopedics;  Laterality: Right;  RIGHT HIP SUPERFICIAL I&D, POSSIBLE DEEP I&D, LINER EXCHANGE, ABX BEAD PLACEMENT, WOUND VAC.   Marland Kitchen PR VEIN BYPASS GRAFT,AORTO-FEM-POP  05/25/11  . REMOVAL OF FIBROUS CYST FROM RIGHT BREAST  10+ YEARS  . RETINAL DETACHMENT SURGERY  10+ YEARS   LEFT EYE  . TOTAL HIP ARTHROPLASTY Right 03/06/2016  . TOTAL HIP ARTHROPLASTY Right 03/06/2016   Procedure: RIGHT TOTAL HIP ARTHROPLASTY ANTERIOR APPROACH;  Surgeon: Meredith Pel, MD;  Location: [facility];  Service: Orthopedics;  Laterality: Right;  . TRANSTHORACIC ECHOCARDIOGRAM  12/01/2009   EF 60-65%   Social History   Occupational History  . [employer] Retired    retired   Social History Main Topics  . Smoking status: Former Smoker    Years: 20.00    Types: Cigarettes    Quit date: 07/10/1991  . Smokeless tobacco: Never Used  . Alcohol use No  . Drug use: No  . Sexual activity:  No

## 2016-04-30 NOTE — Discharge Summary (Signed)
Physician Discharge Summary  Patient ID: Norma [name] MRN: [id number] DOB/AGE: 1946-04-13 71 y.o.  Admit date: 04/20/2016 Discharge date: 04/26/2016  Admission Diagnoses:  Active Problems:   Prosthetic hip infection ([place])   Postoperative wound infection of right hip   Discharge Diagnoses:  Same  Surgeries: Procedure(s): IRRIGATION AND DEBRIDEMENT EXTREMITY APPLICATION OF WOUND VAC CHANGED on 04/20/2016 - 04/24/2016   Consultants:   Discharged Condition: Stable  Hospital Course: Norma [name] is an 70 y.o. female who was admitted 04/20/2016 with a chief complaint of a superficial wound infection, and found to have a diagnosis of superficial wound infection.  They were brought to the operating room on 04/20/2016 - 04/24/2016 and underwent the above named procedures.  Cultures grew out Proteus and Escherichia coli.  These specimens were widely sensitive to Ancef and doxycycline.  She was maintained on vancomycin and Zosyn until cultures returned.  Creatinine did increase but was decreasing by the time of discharge once the patient was discontinued on vancomycin.  She is discharged in good condition with wound VAC to be changed 3 times a week.  This is a standard wound VAC.  We'll continue on oral antibiotics for at least 10 days postop.  No evidence of deep prosthetic infection.  She will follow-up with me in 7 days  Antibiotics given:  Anti-infectives    Start     Dose/Rate Route Frequency Ordered Stop   04/25/16 0000  cephALEXin (KEFLEX) 500 MG capsule     500 mg Oral 3 times daily 04/25/16 0753     04/23/16 2200  ceFAZolin (ANCEF) IVPB 2g/100 mL premix  Status:  Discontinued     2 g 200 mL/hr over 30 Minutes Intravenous Every 12 hours 04/23/16 1347 04/26/16 1833   04/21/16 0800  vancomycin (VANCOCIN) 1,250 mg in sodium chloride 0.9 % 250 mL IVPB  Status:  Discontinued     1,250 mg 166.7 mL/hr over 90 Minutes Intravenous Every 24 hours 04/20/16 1949 04/23/16 1345   04/20/16  2000  piperacillin-tazobactam (ZOSYN) IVPB 3.375 g  Status:  Discontinued     3.375 g 12.5 mL/hr over 240 Minutes Intravenous Every 8 hours 04/20/16 1949 04/23/16 1345   04/20/16 1711  gentamicin (GARAMYCIN) injection  Status:  Discontinued       As needed 04/20/16 1713 04/20/16 1758   04/20/16 1711  vancomycin (VANCOCIN) powder  Status:  Discontinued       As needed 04/20/16 1713 04/20/16 1758    .  Recent vital signs:  Vitals:   04/26/16 0605 04/26/16 1222  BP: (!) 144/45 133/69  Pulse: (!) 40 (!) 45  Resp: 16 16  Temp: 98 F (36.7 C) 97.9 F (36.6 C)    Recent laboratory studies:  Results for orders placed or performed during the hospital encounter of 04/20/16  Aerobic/Anaerobic Culture (surgical/deep wound)  Result Value Ref Range   Specimen Description WOUND RIGHT HIP    Special Requests NONE    Gram Stain      RARE WBC PRESENT,BOTH PMN AND MONONUCLEAR RARE GRAM POSITIVE COCCI IN PAIRS    Culture      FEW PROTEUS MIRABILIS FEW ESCHERICHIA COLI NO ANAEROBES ISOLATED    Report Status 04/25/2016 FINAL    Organism ID, Bacteria PROTEUS MIRABILIS    Organism ID, Bacteria ESCHERICHIA COLI       Susceptibility   Escherichia coli - MIC*    AMPICILLIN 8 SENSITIVE Sensitive     CEFAZOLIN <=4 SENSITIVE Sensitive  CEFEPIME <=1 SENSITIVE Sensitive     CEFTAZIDIME <=1 SENSITIVE Sensitive     CEFTRIAXONE <=1 SENSITIVE Sensitive     CIPROFLOXACIN <=0.25 SENSITIVE Sensitive     GENTAMICIN <=1 SENSITIVE Sensitive     IMIPENEM <=0.25 SENSITIVE Sensitive     TRIMETH/SULFA <=20 SENSITIVE Sensitive     AMPICILLIN/SULBACTAM 4 SENSITIVE Sensitive     PIP/TAZO <=4 SENSITIVE Sensitive     Extended ESBL NEGATIVE Sensitive     * FEW ESCHERICHIA COLI   Proteus mirabilis - MIC*    AMPICILLIN >=32 RESISTANT Resistant     CEFAZOLIN <=4 SENSITIVE Sensitive     CEFEPIME <=1 SENSITIVE Sensitive     CEFTAZIDIME <=1 SENSITIVE Sensitive     CEFTRIAXONE <=1 SENSITIVE Sensitive      CIPROFLOXACIN <=0.25 SENSITIVE Sensitive     GENTAMICIN <=1 SENSITIVE Sensitive     IMIPENEM 1 SENSITIVE Sensitive     TRIMETH/SULFA >=320 RESISTANT Resistant     AMPICILLIN/SULBACTAM 8 SENSITIVE Sensitive     PIP/TAZO <=4 SENSITIVE Sensitive     * FEW PROTEUS MIRABILIS  Surgical pcr screen  Result Value Ref Range   MRSA, PCR NEGATIVE NEGATIVE   Staphylococcus aureus NEGATIVE NEGATIVE  Basic metabolic panel  Result Value Ref Range   Sodium 143 135 - 145 mmol/L   Potassium 4.3 3.5 - 5.1 mmol/L   Chloride 115 (H) 101 - 111 mmol/L   CO2 21 (L) 22 - 32 mmol/L   Glucose, Bld 109 (H) 65 - 99 mg/dL   BUN 27 (H) 6 - 20 mg/dL   Creatinine, Ser 1.46 (H) 0.44 - 1.00 mg/dL   Calcium 8.7 (L) 8.9 - 10.3 mg/dL   GFR calc non Af Amer 35 (L) >60 mL/min   GFR calc Af Amer 41 (L) >60 mL/min   Anion gap 7 5 - 15  CBC  Result Value Ref Range   WBC 6.5 4.0 - 10.5 K/uL   RBC 2.86 (L) 3.87 - 5.11 MIL/uL   Hemoglobin 7.8 (L) 12.0 - 15.0 g/dL   HCT 25.7 (L) 36.0 - 46.0 %   MCV 89.9 78.0 - 100.0 fL   MCH 27.3 26.0 - 34.0 pg   MCHC 30.4 30.0 - 36.0 g/dL   RDW 16.4 (H) 11.5 - 15.5 %   Platelets 207 150 - 400 K/uL  Glucose, capillary  Result Value Ref Range   Glucose-Capillary 118 (H) 65 - 99 mg/dL   Comment 1 Notify RN    Comment 2 Document in Chart   Glucose, capillary  Result Value Ref Range   Glucose-Capillary 98 65 - 99 mg/dL  Glucose, capillary  Result Value Ref Range   Glucose-Capillary 143 (H) 65 - 99 mg/dL  Glucose, capillary  Result Value Ref Range   Glucose-Capillary 152 (H) 65 - 99 mg/dL  Glucose, capillary  Result Value Ref Range   Glucose-Capillary 162 (H) 65 - 99 mg/dL  Glucose, capillary  Result Value Ref Range   Glucose-Capillary 244 (H) 65 - 99 mg/dL  Glucose, capillary  Result Value Ref Range   Glucose-Capillary 176 (H) 65 - 99 mg/dL  Glucose, capillary  Result Value Ref Range   Glucose-Capillary 164 (H) 65 - 99 mg/dL  Glucose, capillary  Result Value Ref Range    Glucose-Capillary 190 (H) 65 - 99 mg/dL  Glucose, capillary  Result Value Ref Range   Glucose-Capillary 156 (H) 65 - 99 mg/dL  Basic metabolic panel  Result Value Ref Range   Sodium 141 135 -  145 mmol/L   Potassium 5.2 (H) 3.5 - 5.1 mmol/L   Chloride 113 (H) 101 - 111 mmol/L   CO2 21 (L) 22 - 32 mmol/L   Glucose, Bld 127 (H) 65 - 99 mg/dL   BUN 52 (H) 6 - 20 mg/dL   Creatinine, Ser 2.95 (H) 0.44 - 1.00 mg/dL   Calcium 8.1 (L) 8.9 - 10.3 mg/dL   GFR calc non Af Amer 15 (L) >60 mL/min   GFR calc Af Amer 17 (L) >60 mL/min   Anion gap 7 5 - 15  Glucose, capillary  Result Value Ref Range   Glucose-Capillary 157 (H) 65 - 99 mg/dL  Glucose, capillary  Result Value Ref Range   Glucose-Capillary 115 (H) 65 - 99 mg/dL  Glucose, capillary  Result Value Ref Range   Glucose-Capillary 165 (H) 65 - 99 mg/dL  Glucose, capillary  Result Value Ref Range   Glucose-Capillary 166 (H) 65 - 99 mg/dL  Basic metabolic panel  Result Value Ref Range   Sodium 142 135 - 145 mmol/L   Potassium 4.8 3.5 - 5.1 mmol/L   Chloride 113 (H) 101 - 111 mmol/L   CO2 23 22 - 32 mmol/L   Glucose, Bld 101 (H) 65 - 99 mg/dL   BUN 48 (H) 6 - 20 mg/dL   Creatinine, Ser 2.69 (H) 0.44 - 1.00 mg/dL   Calcium 8.4 (L) 8.9 - 10.3 mg/dL   GFR calc non Af Amer 17 (L) >60 mL/min   GFR calc Af Amer 20 (L) >60 mL/min   Anion gap 6 5 - 15  Glucose, capillary  Result Value Ref Range   Glucose-Capillary 113 (H) 65 - 99 mg/dL  Glucose, capillary  Result Value Ref Range   Glucose-Capillary 113 (H) 65 - 99 mg/dL  Glucose, capillary  Result Value Ref Range   Glucose-Capillary 70 65 - 99 mg/dL  Glucose, capillary  Result Value Ref Range   Glucose-Capillary 92 65 - 99 mg/dL    Discharge Medications:     Medication List    TAKE these medications   allopurinol 100 MG tablet Commonly known as:  ZYLOPRIM Take 100 mg by mouth daily as needed.   aspirin 325 MG EC tablet Take 1 tablet (325 mg total) by mouth daily with  breakfast. What changed:  medication strength  how much to take  when to take this   atorvastatin 80 MG tablet Commonly known as:  LIPITOR Take 1 tablet (80 mg total) by mouth daily. What changed:  when to take this   bisacodyl 10 MG suppository Commonly known as:  DULCOLAX Place 1 suppository (10 mg total) rectally daily as needed for moderate constipation.   carvedilol 12.5 MG tablet Commonly known as:  COREG Take 1 tablet (12.5 mg total) by mouth 2 (two) times daily.   cephALEXin 500 MG capsule Commonly known as:  KEFLEX Take 1 capsule (500 mg total) by mouth 3 (three) times daily.   chlorthalidone 25 MG tablet Commonly known as:  HYGROTON Take 25 mg by mouth every morning.   cholecalciferol 1000 units tablet Commonly known as:  VITAMIN D Take 1,000 Units by mouth daily as needed (supplement).   cloNIDine 0.3 MG tablet Commonly known as:  CATAPRES Take 0.3 mg by mouth 2 (two) times daily.   Coenzyme Q10 200 MG capsule Take 200 mg by mouth every morning.   ferrous sulfate 325 (65 FE) MG tablet Take 325 mg by mouth daily with breakfast.   furosemide 80  MG tablet Commonly known as:  LASIX Take 80 mg by mouth daily.   glucose blood test strip 1.8 each by Other route as needed for other. Use as instructed   hydrALAZINE 50 MG tablet Commonly known as:  APRESOLINE Take 50 mg by mouth 3 (three) times daily.   HYDROcodone-acetaminophen 10-325 MG tablet Commonly known as:  NORCO Take 1 tablet by mouth every 6 (six) hours as needed for moderate pain. What changed:  reasons to take this   insulin glargine 100 UNIT/ML injection Commonly known as:  LANTUS Inject 0.2 mLs (20 Units total) into the skin 2 (two) times daily. What changed:  how much to take   isosorbide dinitrate 20 MG tablet Commonly known as:  ISORDIL Take 40 mg by mouth 2 (two) times daily.   losartan 100 MG tablet Commonly known as:  COZAAR Take 100 mg by mouth daily.   Magnesium 250 MG  Tabs Take 250 mg by mouth every morning.   menthol-cetylpyridinium 3 MG lozenge Commonly known as:  CEPACOL Take 1 lozenge (3 mg total) by mouth as needed for sore throat (sore throat).   ondansetron 4 MG tablet Commonly known as:  ZOFRAN Take 1 tablet (4 mg total) by mouth every 6 (six) hours as needed for nausea.   OVER THE COUNTER MEDICATION Over the counter laxative from [facility]   PROBIOTIC DAILY PO Take 1 tablet by mouth daily as needed (supplement).   VICTOZA 18 MG/3ML Sopn Generic drug:  liraglutide Inject 1.8 mg into the skin every morning.   vitamin B-12 500 MCG tablet Commonly known as:  CYANOCOBALAMIN Take 500 mcg by mouth every morning.       Diagnostic Studies: No results found.  Disposition: 01-Home or Self Care  Discharge Instructions    Call MD / Call 911    Complete by:  As directed    If you experience chest pain or shortness of breath, CALL 911 and be transported to the hospital emergency room.  If you develope a fever above 101 F, pus (white drainage) or increased drainage or redness at the wound, or calf pain, call your surgeon's office.   Call MD / Call 911    Complete by:  As directed    If you experience chest pain or shortness of breath, CALL 911 and be transported to the hospital emergency room.  If you develope a fever above 101.5 F, pus (white drainage) or increased drainage or redness at the wound, or calf pain, call your surgeon's office.   Constipation Prevention    Complete by:  As directed    Drink plenty of fluids.  Prune juice may be helpful.  You may use a stool softener, such as Colace (over the counter) 100 mg twice a day.  Use MiraLax (over the counter) for constipation as needed.   Constipation Prevention    Complete by:  As directed    Drink plenty of fluids.  Prune juice may be helpful.  You may use a stool softener, such as Colace (over the counter) 100 mg twice a day.  Use MiraLax (over the counter) for constipation as needed.    Diet - low sodium heart healthy    Complete by:  As directed    Diet - low sodium heart healthy    Complete by:  As directed    Diet general    Complete by:  As directed    Discharge instructions    Complete by:  As directed  Weight bearing as tolerated Change wound vac Thursday Saturday and Tuesday Take oral antibiotics   Driving restrictions    Complete by:  As directed    No driving while taking narcotic pain meds.   Increase activity slowly as tolerated    Complete by:  As directed    Increase activity slowly as tolerated    Complete by:  As directed       Follow-up [facility] .   Why:  Someone from [facility] will contact you to arrange start date and time for therapy.  Contact information: [address] [place] [address] [phone]            Signed: Anderson Malta 04/30/2016, 5:17 PM

## 2016-04-30 NOTE — Telephone Encounter (Signed)
I called patient, she is asking about the wound vac, and bringing her extra supplies to changed it out.  I advised her to bring the pack of supplies and I can change this wound vac for her today.

## 2016-05-02 ENCOUNTER — Encounter (HOSPITAL_COMMUNITY): Payer: Self-pay | Admitting: Orthopedic Surgery

## 2016-05-17 ENCOUNTER — Telehealth (INDEPENDENT_AMBULATORY_CARE_PROVIDER_SITE_OTHER): Payer: Self-pay | Admitting: Orthopedic Surgery

## 2016-05-17 NOTE — Telephone Encounter (Signed)
Patient called advised she has an appointment Monday 05/21/16 and want to know if the material to pack her WD  will be in the office. She said it was some kind of black foam.   The number to contact her is [phone]

## 2016-05-18 NOTE — Telephone Encounter (Signed)
Pt. Calling asking if wendy will be dressing WD, since her other nurse comes MWF but insurance will not pay for office visit plus nurse coming out. Pt asking if wendy is able to do this and if yes, does she need to bring supplies to do this with her on Monday. Pt. Callback number [phone].

## 2016-05-18 NOTE — Telephone Encounter (Signed)
IC pt and discussed, she says that the RN came today to change the wound vac.  She will need to bring the dressing change kit for the wound vac. She understands.

## 2016-05-21 ENCOUNTER — Encounter (INDEPENDENT_AMBULATORY_CARE_PROVIDER_SITE_OTHER): Payer: Self-pay | Admitting: Orthopedic Surgery

## 2016-05-21 ENCOUNTER — Ambulatory Visit (INDEPENDENT_AMBULATORY_CARE_PROVIDER_SITE_OTHER): Payer: Medicare Other | Admitting: Orthopedic Surgery

## 2016-05-21 VITALS — Temp 97.9°F

## 2016-05-21 DIAGNOSIS — T8459XD Infection and inflammatory reaction due to other internal joint prosthesis, subsequent encounter: Secondary | ICD-10-CM

## 2016-05-21 DIAGNOSIS — Z96649 Presence of unspecified artificial hip joint: Secondary | ICD-10-CM

## 2016-05-21 MED ORDER — HYDROCODONE-ACETAMINOPHEN 5-325 MG PO TABS: 1.0000 | ORAL_TABLET | Freq: Four times a day (QID) | ORAL | 0 refills | Status: DC | PRN

## 2016-05-21 NOTE — Progress Notes (Signed)
Post-Op Visit Note   Patient: Norma [name]           Date of Birth: Jan 18, 1946           MRN: [id number] Visit Date: 05/21/2016 PCP: Bartholome Bill, MD   Assessment & Plan:  Chief Complaint:  Chief Complaint  Patient presents with  . Right Hip - Routine Post Op   Visit Diagnoses:  1. Infection of prosthetic hip joint, subsequent encounter     Plan: Davine is now about a month out from debridement with wound VAC placement 04/24/2016.  She had some nausea yesterday and fever but no right hip pain.  On exam granulation tissue is present no pain with range of motion of the right hip there is no real drainage from this hip region.  An refill her hydrocodone see her back in about 4 weeks temp today currently 97 9 Is applied  Follow-Up Instructions: Return in about 4 weeks (around 06/18/2016).   Orders:  No orders of the defined types were placed in this encounter.  Meds ordered this encounter  Medications  . HYDROcodone-acetaminophen (NORCO/VICODIN) 5-325 MG tablet    Sig: Take 1 tablet by mouth every 6 (six) hours as needed for moderate pain.    Dispense:  60 tablet    Refill:  0     PMFS History: Patient Active Problem List   Diagnosis Date Noted  . Presence of right artificial knee joint 04/30/2016  . Postoperative wound infection of right hip 04/24/2016  . Prosthetic hip infection ([place]) 04/20/2016  . Degenerative arthritis of hip 03/06/2016  . Gout of left knee 10/31/2015  . Right hip pain 10/29/2015  . DM type 2 causing CKD stage 3 ([place]) 10/29/2015  . Morbid obesity ([place]) 10/29/2015  . CKD (chronic kidney disease) stage 4, GFR 15-29 ml/min ([facility]) 04/18/2014  . Aortic atherosclerosis ([place]) 04/18/2014  . Bilateral carotid bruits 04/16/2014  . Pulsatile tinnitus of right ear 04/16/2014  . Diarrhea 09/18/2011  . Peripheral vascular disease, unspecified 08/14/2011  . Claudication ([place]) 08/14/2011  . Edema 06/29/2011  . Leucocytosis 06/12/2011  . Acute on  chronic kidney disease, stage 4 05/28/2011  . Respiratory failure, acute ([place]) 05/25/2011    Class: Acute  . S/P aorto-bifemoral bypass surgery 05/25/2011    Class: Acute  . PAD (peripheral artery disease) ([place])   . Coronary artery disease   . Hyperlipidemia   . Hypertension    Past Medical History:  Diagnosis Date  . Anxiety   . Carotid artery occlusion   . CKD (chronic kidney disease) stage 3, GFR 30-59 ml/min 04/18/2014  . Claudication ([place])   . Constipation    Due to patient taking iron supplements  . Coronary artery disease   . DDD (degenerative disc disease)   . Diabetes mellitus   . Diabetic coma ([place])   . DJD (degenerative joint disease)   . DJD (degenerative joint disease)   . Edema    Bilateral lower extermities  . Gout   . Hyperlipidemia   . Hypertension   . Iron deficiency anemia   . Leg pain   . Obesity   . PAD (peripheral artery disease) ([place])   . Shortness of breath    exertion    Family History  Problem Relation Age of Onset  . Hypertension Mother   . Coronary artery disease Father   . Hypertension Sister   . Cirrhosis Brother   . Kidney disease Daughter     Past Surgical  History:  Procedure Laterality Date  . AORTA - BILATERAL FEMORAL ARTERY BYPASS GRAFT  05/25/2011   Procedure: AORTA BIFEMORAL BYPASS GRAFT;  Surgeon: Mal Misty, MD;  Location: [facility];  Service: Vascular;  Laterality: N/A;  . APPLICATION OF WOUND VAC Right 04/23/2016   Procedure: APPLICATION OF WOUND VAC CHANGED;  Surgeon: Meredith Pel, MD;  Location: [facility];  Service: Orthopedics;  Laterality: Right;  . BREAST SURGERY    . CARDIAC CATHETERIZATION  12/01/2009   EF 65%  . CARDIOVASCULAR STRESS TEST  11/28/2009   EF 75%  . COLONOSCOPY W/ POLYPECTOMY    . CORONARY ARTERY BYPASS GRAFT  12/08/2009   LIMA GRAFT TO THE DISTAL LAD AND SAPHENOUS VEIN GRAFT TO THE OBTUSE MARGINAL VESSEL  . EYE SURGERY    . I&D EXTREMITY Right 04/23/2016   Procedure: IRRIGATION AND DEBRIDEMENT  EXTREMITY;  Surgeon: Meredith Pel, MD;  Location: [facility];  Service: Orthopedics;  Laterality: Right;  . INCISION AND DRAINAGE HIP Right 04/20/2016   Procedure: IRRIGATION AND DEBRIDEMENT HIP WITH POLY EXCHANGE, ABX BEAD PLACEMENT, WOUND VAC ;  Surgeon: Meredith Pel, MD;  Location: [facility];  Service: Orthopedics;  Laterality: Right;  RIGHT HIP SUPERFICIAL I&D, POSSIBLE DEEP I&D, LINER EXCHANGE, ABX BEAD PLACEMENT, WOUND VAC.   Marland Kitchen PR VEIN BYPASS GRAFT,AORTO-FEM-POP  05/25/11  . REMOVAL OF FIBROUS CYST FROM RIGHT BREAST  10+ YEARS  . RETINAL DETACHMENT SURGERY  10+ YEARS   LEFT EYE  . TOTAL HIP ARTHROPLASTY Right 03/06/2016  . TOTAL HIP ARTHROPLASTY Right 03/06/2016   Procedure: RIGHT TOTAL HIP ARTHROPLASTY ANTERIOR APPROACH;  Surgeon: Meredith Pel, MD;  Location: [facility];  Service: Orthopedics;  Laterality: Right;  . TRANSTHORACIC ECHOCARDIOGRAM  12/01/2009   EF 60-65%   Social History   Occupational History  . [employer] Retired    retired   Social History Main Topics  . Smoking status: Former Smoker    Years: 20.00    Types: Cigarettes    Quit date: 07/10/1991  . Smokeless tobacco: Never Used  . Alcohol use No  . Drug use: No  . Sexual activity: No

## 2016-06-18 ENCOUNTER — Ambulatory Visit (INDEPENDENT_AMBULATORY_CARE_PROVIDER_SITE_OTHER): Payer: Medicare Other | Admitting: Orthopedic Surgery

## 2016-06-18 ENCOUNTER — Encounter (INDEPENDENT_AMBULATORY_CARE_PROVIDER_SITE_OTHER): Payer: Self-pay

## 2016-06-18 ENCOUNTER — Encounter (INDEPENDENT_AMBULATORY_CARE_PROVIDER_SITE_OTHER): Payer: Self-pay | Admitting: Orthopedic Surgery

## 2016-06-18 ENCOUNTER — Other Ambulatory Visit: Payer: Self-pay | Admitting: Family Medicine

## 2016-06-18 DIAGNOSIS — T8459XD Infection and inflammatory reaction due to other internal joint prosthesis, subsequent encounter: Secondary | ICD-10-CM

## 2016-06-18 DIAGNOSIS — Z96649 Presence of unspecified artificial hip joint: Secondary | ICD-10-CM

## 2016-06-18 DIAGNOSIS — Z1231 Encounter for screening mammogram for malignant neoplasm of breast: Secondary | ICD-10-CM

## 2016-06-18 NOTE — Progress Notes (Signed)
Post-Op Visit Note   Patient: Norma [name]           Date of Birth: 19-Mar-1946           MRN: [id number] Visit Date: 06/18/2016 PCP: Bartholome Bill, MD   Assessment & Plan:  Chief Complaint:  Chief Complaint  Patient presents with  . Right Hip - Routine Post Op    S/p I&D right hip 04/23/16   Visit Diagnoses:  1. Infection of prosthetic hip joint, subsequent encounter     Plan: Koree is a 70 year old female superficial infection right hip following anterior total hip replacement.  This was not a deep infection.  Not having any fever and chills.  The wound VAC area is really filling in nicely with granulation tissue.  Ablation the that for another to 3 weeks.  I will see her back in 3 weeks for final check.  Will probably start the workup on her back at that time as well.  That'll be with MRI scanning and possible ESI is to follow  Follow-Up Instructions: No Follow-up on file.   Orders:  No orders of the defined types were placed in this encounter.  No orders of the defined types were placed in this encounter.    PMFS History: Patient Active Problem List   Diagnosis Date Noted  . Presence of right artificial knee joint 04/30/2016  . Postoperative wound infection of right hip 04/24/2016  . Prosthetic hip infection ([place]) 04/20/2016  . Degenerative arthritis of hip 03/06/2016  . Gout of left knee 10/31/2015  . Right hip pain 10/29/2015  . DM type 2 causing CKD stage 3 ([place]) 10/29/2015  . Morbid obesity ([place]) 10/29/2015  . CKD (chronic kidney disease) stage 4, GFR 15-29 ml/min ([facility]) 04/18/2014  . Aortic atherosclerosis ([place]) 04/18/2014  . Bilateral carotid bruits 04/16/2014  . Pulsatile tinnitus of right ear 04/16/2014  . Diarrhea 09/18/2011  . Peripheral vascular disease, unspecified 08/14/2011  . Claudication ([place]) 08/14/2011  . Edema 06/29/2011  . Leucocytosis 06/12/2011  . Acute on chronic kidney disease, stage 4 05/28/2011  . Respiratory failure, acute  ([place]) 05/25/2011    Class: Acute  . S/P aorto-bifemoral bypass surgery 05/25/2011    Class: Acute  . PAD (peripheral artery disease) ([place])   . Coronary artery disease   . Hyperlipidemia   . Hypertension    Past Medical History:  Diagnosis Date  . Anxiety   . Carotid artery occlusion   . CKD (chronic kidney disease) stage 3, GFR 30-59 ml/min 04/18/2014  . Claudication ([place])   . Constipation    Due to patient taking iron supplements  . Coronary artery disease   . DDD (degenerative disc disease)   . Diabetes mellitus   . Diabetic coma ([place])   . DJD (degenerative joint disease)   . DJD (degenerative joint disease)   . Edema    Bilateral lower extermities  . Gout   . Hyperlipidemia   . Hypertension   . Iron deficiency anemia   . Leg pain   . Obesity   . PAD (peripheral artery disease) ([place])   . Shortness of breath    exertion    Family History  Problem Relation Age of Onset  . Hypertension Mother   . Coronary artery disease Father   . Hypertension Sister   . Cirrhosis Brother   . Kidney disease Daughter     Past Surgical History:  Procedure Laterality Date  . AORTA - BILATERAL FEMORAL ARTERY BYPASS  GRAFT  05/25/2011   Procedure: AORTA BIFEMORAL BYPASS GRAFT;  Surgeon: Mal Misty, MD;  Location: [facility];  Service: Vascular;  Laterality: N/A;  . APPLICATION OF WOUND VAC Right 04/23/2016   Procedure: APPLICATION OF WOUND VAC CHANGED;  Surgeon: Meredith Pel, MD;  Location: [facility];  Service: Orthopedics;  Laterality: Right;  . BREAST SURGERY    . CARDIAC CATHETERIZATION  12/01/2009   EF 65%  . CARDIOVASCULAR STRESS TEST  11/28/2009   EF 75%  . COLONOSCOPY W/ POLYPECTOMY    . CORONARY ARTERY BYPASS GRAFT  12/08/2009   LIMA GRAFT TO THE DISTAL LAD AND SAPHENOUS VEIN GRAFT TO THE OBTUSE MARGINAL VESSEL  . EYE SURGERY    . I&D EXTREMITY Right 04/23/2016   Procedure: IRRIGATION AND DEBRIDEMENT EXTREMITY;  Surgeon: Meredith Pel, MD;  Location: [facility];  Service:  Orthopedics;  Laterality: Right;  . INCISION AND DRAINAGE HIP Right 04/20/2016   Procedure: IRRIGATION AND DEBRIDEMENT HIP WITH POLY EXCHANGE, ABX BEAD PLACEMENT, WOUND VAC ;  Surgeon: Meredith Pel, MD;  Location: [facility];  Service: Orthopedics;  Laterality: Right;  RIGHT HIP SUPERFICIAL I&D, POSSIBLE DEEP I&D, LINER EXCHANGE, ABX BEAD PLACEMENT, WOUND VAC.   Marland Kitchen PR VEIN BYPASS GRAFT,AORTO-FEM-POP  05/25/11  . REMOVAL OF FIBROUS CYST FROM RIGHT BREAST  10+ YEARS  . RETINAL DETACHMENT SURGERY  10+ YEARS   LEFT EYE  . TOTAL HIP ARTHROPLASTY Right 03/06/2016  . TOTAL HIP ARTHROPLASTY Right 03/06/2016   Procedure: RIGHT TOTAL HIP ARTHROPLASTY ANTERIOR APPROACH;  Surgeon: Meredith Pel, MD;  Location: [facility];  Service: Orthopedics;  Laterality: Right;  . TRANSTHORACIC ECHOCARDIOGRAM  12/01/2009   EF 60-65%   Social History   Occupational History  . [employer] Retired    retired   Social History Main Topics  . Smoking status: Former Smoker    Years: 20.00    Types: Cigarettes    Quit date: 07/10/1991  . Smokeless tobacco: Never Used  . Alcohol use No  . Drug use: No  . Sexual activity: No

## 2016-06-20 ENCOUNTER — Telehealth (INDEPENDENT_AMBULATORY_CARE_PROVIDER_SITE_OTHER): Payer: Self-pay | Admitting: Orthopaedic Surgery

## 2016-06-20 MED ORDER — HYDROCODONE-ACETAMINOPHEN 5-325 MG PO TABS: 1.0000 | ORAL_TABLET | Freq: Four times a day (QID) | ORAL | 0 refills | Status: DC | PRN

## 2016-06-20 NOTE — Telephone Encounter (Signed)
Ok for rx pls call thx

## 2016-06-20 NOTE — Telephone Encounter (Signed)
Please advise 

## 2016-06-20 NOTE — Telephone Encounter (Signed)
Your patient, they sent to me in error.

## 2016-06-20 NOTE — Telephone Encounter (Signed)
IC patient and advised Rx norco ready for pickup.

## 2016-06-26 ENCOUNTER — Telehealth (INDEPENDENT_AMBULATORY_CARE_PROVIDER_SITE_OTHER): Payer: Self-pay | Admitting: Orthopedic Surgery

## 2016-06-26 NOTE — Telephone Encounter (Signed)
IC and gave verbal ok for this

## 2016-07-04 ENCOUNTER — Telehealth (INDEPENDENT_AMBULATORY_CARE_PROVIDER_SITE_OTHER): Payer: Self-pay | Admitting: Orthopedic Surgery

## 2016-07-04 NOTE — Telephone Encounter (Signed)
Patient called to request that her wound vac be removed asap. She has nurse coming out today from [facility] she wants to know if they can remove it. The nurse she seen on christmas says the machine has done all that its designed to do & says her wound looks good with minimal drainage.  Cb# [phone]

## 2016-07-04 NOTE — Telephone Encounter (Signed)
Regarding last message,   Norma [name] from [facility] Cb# [phone] Says that the wound is very small, has closed up a lot since last visit and there is no need for the wound vac anymore As soon as we can get an order to remove this please call her so she can follow up with the process.

## 2016-07-04 NOTE — Telephone Encounter (Signed)
Order faxed to [facility] [phone], called and spoke with Jonathon Bellows to make her aware.

## 2016-07-04 NOTE — Telephone Encounter (Signed)
Ok to D/C wound vac per message from Dr. Marlou Sa

## 2016-07-06 ENCOUNTER — Telehealth (INDEPENDENT_AMBULATORY_CARE_PROVIDER_SITE_OTHER): Payer: Self-pay | Admitting: Orthopedic Surgery

## 2016-07-06 NOTE — Telephone Encounter (Signed)
Can you please advise?

## 2016-07-06 NOTE — Telephone Encounter (Signed)
Pt home health nurse called about dressing.

## 2016-07-06 NOTE — Telephone Encounter (Signed)
What dressing?

## 2016-07-06 NOTE — Telephone Encounter (Signed)
Donita O'dom (nurse) with [facility] called needing clarification on how often to do the wet to dry dressing. The number to contact her is [phone]

## 2016-07-10 ENCOUNTER — Encounter (INDEPENDENT_AMBULATORY_CARE_PROVIDER_SITE_OTHER): Payer: Self-pay | Admitting: Orthopaedic Surgery

## 2016-07-10 ENCOUNTER — Ambulatory Visit (INDEPENDENT_AMBULATORY_CARE_PROVIDER_SITE_OTHER): Payer: Medicare Other | Admitting: Orthopaedic Surgery

## 2016-07-10 DIAGNOSIS — M25562 Pain in left knee: Secondary | ICD-10-CM

## 2016-07-10 MED ORDER — METHYLPREDNISOLONE ACETATE 40 MG/ML IJ SUSP
40.0000 mg | INTRAMUSCULAR | Status: AC | PRN
  Administered 2016-07-10: 40 mg via INTRA_ARTICULAR

## 2016-07-10 MED ORDER — BUPIVACAINE HCL 0.5 % IJ SOLN
2.0000 mL | INTRAMUSCULAR | Status: AC | PRN
  Administered 2016-07-10: 2 mL via INTRA_ARTICULAR

## 2016-07-10 MED ORDER — LIDOCAINE HCL 1 % IJ SOLN
2.0000 mL | INTRAMUSCULAR | Status: AC | PRN
  Administered 2016-07-10: 2 mL

## 2016-07-10 NOTE — Progress Notes (Signed)
Office Visit Note   Patient: Norma [name]           Date of Birth: 10/21/1945           MRN: [id number] Visit Date: 07/10/2016              Requested by: [place], MD [address], [place] [address] PCP: Bartholome Bill, MD   Assessment & Plan: Visit Diagnoses:  1. Acute pain of left knee     Plan: Intra-articular steroid injection was performed today patient tolerates well see her back as needed.  Follow-Up Instructions: Return if symptoms worsen or fail to improve.   Orders:  No orders of the defined types were placed in this encounter.  No orders of the defined types were placed in this encounter.     Procedures: Large Joint Inj Date/Time: 07/10/2016 6:39 PM Performed by: Leandrew Koyanagi Authorized by: Leandrew Koyanagi   Consent Given by:  Patient Timeout: prior to procedure the correct patient, procedure, and site was verified   Indications:  Pain Location:  Knee Site:  R knee Prep: patient was prepped and draped in usual sterile fashion   Needle Size:  22 G Ultrasound Guidance: No   Fluoroscopic Guidance: No   Arthrogram: No   Medications:  2 mL lidocaine 1 %; 2 mL bupivacaine 0.5 %; 40 mg methylPREDNISolone acetate 40 MG/ML Patient tolerance:  Patient tolerated the procedure well with no immediate complications     Clinical Data: No additional findings.   Subjective: Chief Complaint  Patient presents with  . Left Knee - Pain    Patient comes in today for left knee pain requesting a knee injection. She's had previous injections with good relief.    Review of Systems   Objective: Vital Signs: There were no vitals taken for this visit.  Physical Exam  Ortho Exam Exam of the left knee shows no joint effusion. Specialty Comments:  No specialty comments available.  Imaging: No results found.   PMFS History: Patient Active Problem List   Diagnosis Date Noted  . Presence of right  artificial knee joint 04/30/2016  . Postoperative wound infection of right hip 04/24/2016  . Prosthetic hip infection ([place]) 04/20/2016  . Degenerative arthritis of hip 03/06/2016  . Gout of left knee 10/31/2015  . Right hip pain 10/29/2015  . Acute pain of left knee 10/29/2015  . DM type 2 causing CKD stage 3 ([place]) 10/29/2015  . Morbid obesity ([place]) 10/29/2015  . CKD (chronic kidney disease) stage 4, GFR 15-29 ml/min (HCC) 04/18/2014  . Aortic atherosclerosis ([place]) 04/18/2014  . Bilateral carotid bruits 04/16/2014  . Pulsatile tinnitus of right ear 04/16/2014  . Diarrhea 09/18/2011  . Peripheral vascular disease, unspecified 08/14/2011  . Claudication ([place]) 08/14/2011  . Edema 06/29/2011  . Leucocytosis 06/12/2011  . Acute on chronic kidney disease, stage 4 05/28/2011  . Respiratory failure, acute ([place]) 05/25/2011    Class: Acute  . S/P aorto-bifemoral bypass surgery 05/25/2011    Class: Acute  . PAD (peripheral artery disease) ([place])   . Coronary artery disease   . Hyperlipidemia   . Hypertension    Past Medical History:  Diagnosis Date  . Anxiety   . Carotid artery occlusion   . CKD (chronic kidney disease) stage 3, GFR 30-59 ml/min 04/18/2014  . Claudication ([place])   . Constipation    Due to patient taking iron supplements  . Coronary artery disease   .  DDD (degenerative disc disease)   . Diabetes mellitus   . Diabetic coma ([place])   . DJD (degenerative joint disease)   . DJD (degenerative joint disease)   . Edema    Bilateral lower extermities  . Gout   . Hyperlipidemia   . Hypertension   . Iron deficiency anemia   . Leg pain   . Obesity   . PAD (peripheral artery disease) ([place])   . Shortness of breath    exertion    Family History  Problem Relation Age of Onset  . Hypertension Mother   . Coronary artery disease Father   . Hypertension Sister   . Cirrhosis Brother   . Kidney disease Daughter     Past Surgical History:  Procedure Laterality Date  . AORTA  - BILATERAL FEMORAL ARTERY BYPASS GRAFT  05/25/2011   Procedure: AORTA BIFEMORAL BYPASS GRAFT;  Surgeon: Mal Misty, MD;  Location: [facility];  Service: Vascular;  Laterality: N/A;  . APPLICATION OF WOUND VAC Right 04/23/2016   Procedure: APPLICATION OF WOUND VAC CHANGED;  Surgeon: Meredith Pel, MD;  Location: [facility];  Service: Orthopedics;  Laterality: Right;  . BREAST SURGERY    . CARDIAC CATHETERIZATION  12/01/2009   EF 65%  . CARDIOVASCULAR STRESS TEST  11/28/2009   EF 75%  . COLONOSCOPY W/ POLYPECTOMY    . CORONARY ARTERY BYPASS GRAFT  12/08/2009   LIMA GRAFT TO THE DISTAL LAD AND SAPHENOUS VEIN GRAFT TO THE OBTUSE MARGINAL VESSEL  . EYE SURGERY    . I&D EXTREMITY Right 04/23/2016   Procedure: IRRIGATION AND DEBRIDEMENT EXTREMITY;  Surgeon: Meredith Pel, MD;  Location: [facility];  Service: Orthopedics;  Laterality: Right;  . INCISION AND DRAINAGE HIP Right 04/20/2016   Procedure: IRRIGATION AND DEBRIDEMENT HIP WITH POLY EXCHANGE, ABX BEAD PLACEMENT, WOUND VAC ;  Surgeon: Meredith Pel, MD;  Location: [facility];  Service: Orthopedics;  Laterality: Right;  RIGHT HIP SUPERFICIAL I&D, POSSIBLE DEEP I&D, LINER EXCHANGE, ABX BEAD PLACEMENT, WOUND VAC.   Marland Kitchen PR VEIN BYPASS GRAFT,AORTO-FEM-POP  05/25/11  . REMOVAL OF FIBROUS CYST FROM RIGHT BREAST  10+ YEARS  . RETINAL DETACHMENT SURGERY  10+ YEARS   LEFT EYE  . TOTAL HIP ARTHROPLASTY Right 03/06/2016  . TOTAL HIP ARTHROPLASTY Right 03/06/2016   Procedure: RIGHT TOTAL HIP ARTHROPLASTY ANTERIOR APPROACH;  Surgeon: Meredith Pel, MD;  Location: [facility];  Service: Orthopedics;  Laterality: Right;  . TRANSTHORACIC ECHOCARDIOGRAM  12/01/2009   EF 60-65%   Social History   Occupational History  . [employer] Retired    retired   Social History Main Topics  . Smoking status: Former Smoker    Years: 20.00    Types: Cigarettes    Quit date: 07/10/1991  . Smokeless tobacco: Never Used  . Alcohol use No  . Drug use: No  . Sexual activity:  No

## 2016-07-10 NOTE — Telephone Encounter (Signed)
I am sorry, I didn't get around to this one to check.

## 2016-07-10 NOTE — Telephone Encounter (Signed)
Patient was seen in our office today.  

## 2016-07-11 ENCOUNTER — Telehealth (INDEPENDENT_AMBULATORY_CARE_PROVIDER_SITE_OTHER): Payer: Self-pay | Admitting: Orthopedic Surgery

## 2016-07-11 NOTE — Telephone Encounter (Signed)
Donita (nurse) with [facility] called to cancel previous order. Need  New WD care order to clean with saline    Apply calcium alginate to WD bed and cover with foam dressing 2 times a week. The number to contact her is [phone]

## 2016-07-12 NOTE — Telephone Encounter (Signed)
IC and advised ok, their wound care specialist wants to switch to this dressing, they will see pt twice a week, patient refuses to do her own wound care.

## 2016-07-17 ENCOUNTER — Telehealth (INDEPENDENT_AMBULATORY_CARE_PROVIDER_SITE_OTHER): Payer: Self-pay | Admitting: Orthopedic Surgery

## 2016-07-17 NOTE — Telephone Encounter (Signed)
noted 

## 2016-07-17 NOTE — Telephone Encounter (Signed)
Green drainage is continuing (small amount)  R hip. She states patient has appt tomorrow.

## 2016-07-18 ENCOUNTER — Encounter (INDEPENDENT_AMBULATORY_CARE_PROVIDER_SITE_OTHER): Payer: Self-pay | Admitting: Orthopedic Surgery

## 2016-07-18 ENCOUNTER — Ambulatory Visit (INDEPENDENT_AMBULATORY_CARE_PROVIDER_SITE_OTHER): Payer: Medicare Other | Admitting: Orthopedic Surgery

## 2016-07-18 DIAGNOSIS — G8929 Other chronic pain: Secondary | ICD-10-CM

## 2016-07-18 DIAGNOSIS — M545 Low back pain: Secondary | ICD-10-CM

## 2016-07-18 NOTE — Progress Notes (Signed)
Post-Op Visit Note   Patient: Norma [name]           Date of Birth: 12/25/1945           MRN: [id number] Visit Date: 07/18/2016 PCP: Bartholome Bill, MD   Assessment & Plan:  Chief Complaint:  Chief Complaint  Patient presents with  . Right Hip - Follow-up, Routine Post Op   Visit Diagnoses:  1. Chronic bilateral low back pain without sciatica     Plan: Patient is 3 months out from right hip I&D superficial infection.  Been doing well.  Wound VAC is been discontinued.  She is off antibodies.  She also is having some back pain.  On exam the incision looks like it healed well.  She's having no pain with the hip itself.  She's having some low back pain without much radiation.  [facility]  Physical therapy because she didn't really get much therapy when she was doing the wound VAC.  New dressing is applied today.  I will send her to Dr. Ernestina Patches for evaluation for lumbar spine and epidural surgery and injections.  Follow up  WITH me in 1 year  Follow-Up Instructions: No Follow-up on file.   Orders:  Orders Placed This Encounter  Procedures  . Ambulatory referral to Physical Medicine Rehab   No orders of the defined types were placed in this encounter.    PMFS History: Patient Active Problem List   Diagnosis Date Noted  . Presence of right artificial knee joint 04/30/2016  . Postoperative wound infection of right hip 04/24/2016  . Prosthetic hip infection ([place]) 04/20/2016  . Degenerative arthritis of hip 03/06/2016  . Gout of left knee 10/31/2015  . Right hip pain 10/29/2015  . Acute pain of left knee 10/29/2015  . DM type 2 causing CKD stage 3 ([place]) 10/29/2015  . Morbid obesity ([place]) 10/29/2015  . CKD (chronic kidney disease) stage 4, GFR 15-29 ml/min (HCC) 04/18/2014  . Aortic atherosclerosis ([place]) 04/18/2014  . Bilateral carotid bruits 04/16/2014  . Pulsatile tinnitus of right ear 04/16/2014  . Diarrhea 09/18/2011  .  Peripheral vascular disease, unspecified 08/14/2011  . Claudication ([place]) 08/14/2011  . Edema 06/29/2011  . Leucocytosis 06/12/2011  . Acute on chronic kidney disease, stage 4 05/28/2011  . Respiratory failure, acute ([place]) 05/25/2011    Class: Acute  . S/P aorto-bifemoral bypass surgery 05/25/2011    Class: Acute  . PAD (peripheral artery disease) ([place])   . Coronary artery disease   . Hyperlipidemia   . Hypertension    Past Medical History:  Diagnosis Date  . Anxiety   . Carotid artery occlusion   . CKD (chronic kidney disease) stage 3, GFR 30-59 ml/min 04/18/2014  . Claudication ([place])   . Constipation    Due to patient taking iron supplements  . Coronary artery disease   . DDD (degenerative disc disease)   . Diabetes mellitus   . Diabetic coma ([place])   . DJD (degenerative joint disease)   . DJD (degenerative joint disease)   . Edema    Bilateral lower extermities  . Gout   . Hyperlipidemia   . Hypertension   . Iron deficiency anemia   . Leg pain   . Obesity   . PAD (peripheral artery disease) ([place])   . Shortness of breath    exertion    Family History  Problem Relation Age of Onset  . Hypertension Mother   .  Coronary artery disease Father   . Hypertension Sister   . Cirrhosis Brother   . Kidney disease Daughter     Past Surgical History:  Procedure Laterality Date  . AORTA - BILATERAL FEMORAL ARTERY BYPASS GRAFT  05/25/2011   Procedure: AORTA BIFEMORAL BYPASS GRAFT;  Surgeon: Mal Misty, MD;  Location: [facility];  Service: Vascular;  Laterality: N/A;  . APPLICATION OF WOUND VAC Right 04/23/2016   Procedure: APPLICATION OF WOUND VAC CHANGED;  Surgeon: Meredith Pel, MD;  Location: [facility];  Service: Orthopedics;  Laterality: Right;  . BREAST SURGERY    . CARDIAC CATHETERIZATION  12/01/2009   EF 65%  . CARDIOVASCULAR STRESS TEST  11/28/2009   EF 75%  . COLONOSCOPY W/ POLYPECTOMY    . CORONARY ARTERY BYPASS GRAFT  12/08/2009   LIMA GRAFT TO THE DISTAL LAD AND  SAPHENOUS VEIN GRAFT TO THE OBTUSE MARGINAL VESSEL  . EYE SURGERY    . I&D EXTREMITY Right 04/23/2016   Procedure: IRRIGATION AND DEBRIDEMENT EXTREMITY;  Surgeon: Meredith Pel, MD;  Location: [facility];  Service: Orthopedics;  Laterality: Right;  . INCISION AND DRAINAGE HIP Right 04/20/2016   Procedure: IRRIGATION AND DEBRIDEMENT HIP WITH POLY EXCHANGE, ABX BEAD PLACEMENT, WOUND VAC ;  Surgeon: Meredith Pel, MD;  Location: [facility];  Service: Orthopedics;  Laterality: Right;  RIGHT HIP SUPERFICIAL I&D, POSSIBLE DEEP I&D, LINER EXCHANGE, ABX BEAD PLACEMENT, WOUND VAC.   Marland Kitchen PR VEIN BYPASS GRAFT,AORTO-FEM-POP  05/25/11  . REMOVAL OF FIBROUS CYST FROM RIGHT BREAST  10+ YEARS  . RETINAL DETACHMENT SURGERY  10+ YEARS   LEFT EYE  . TOTAL HIP ARTHROPLASTY Right 03/06/2016  . TOTAL HIP ARTHROPLASTY Right 03/06/2016   Procedure: RIGHT TOTAL HIP ARTHROPLASTY ANTERIOR APPROACH;  Surgeon: Meredith Pel, MD;  Location: [facility];  Service: Orthopedics;  Laterality: Right;  . TRANSTHORACIC ECHOCARDIOGRAM  12/01/2009   EF 60-65%   Social History   Occupational History  . [employer] Retired    retired   Social History Main Topics  . Smoking status: Former Smoker    Years: 20.00    Types: Cigarettes    Quit date: 07/10/1991  . Smokeless tobacco: Never Used  . Alcohol use No  . Drug use: No  . Sexual activity: No

## 2016-07-26 ENCOUNTER — Institutional Professional Consult (permissible substitution) (INDEPENDENT_AMBULATORY_CARE_PROVIDER_SITE_OTHER): Payer: Medicare Other | Admitting: Physical Medicine and Rehabilitation

## 2016-07-31 ENCOUNTER — Ambulatory Visit: Payer: Medicare Other

## 2016-08-01 ENCOUNTER — Ambulatory Visit (INDEPENDENT_AMBULATORY_CARE_PROVIDER_SITE_OTHER): Payer: Medicare Other | Admitting: Physical Medicine and Rehabilitation

## 2016-08-01 ENCOUNTER — Encounter (INDEPENDENT_AMBULATORY_CARE_PROVIDER_SITE_OTHER): Payer: Self-pay | Admitting: Physical Medicine and Rehabilitation

## 2016-08-01 VITALS — BP 156/57 | HR 58

## 2016-08-01 DIAGNOSIS — M545 Low back pain, unspecified: Secondary | ICD-10-CM

## 2016-08-01 DIAGNOSIS — G8929 Other chronic pain: Secondary | ICD-10-CM | POA: Diagnosis not present

## 2016-08-01 DIAGNOSIS — M47816 Spondylosis without myelopathy or radiculopathy, lumbar region: Secondary | ICD-10-CM

## 2016-08-01 NOTE — Progress Notes (Signed)
Norma [name] - 71 y.o. female MRN [id number]  Date of birth: Jul 07, 1946  Office Visit Note: Visit Date: 08/01/2016 PCP: Bartholome Bill, MD Referred by: Bartholome Bill, MD  Subjective: Chief Complaint  Patient presents with  . 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 She complains of worsening chronic lower back pain for several years with walking long periods and doing housework. Pain is in center of lower back. Doesn't radiate. No pain with sitting or laying. Recently had right hip replacement surgery with Dr.Dean. She reported some acute onset of more right hip pain initially but now the pain is really centered in the low back. She denies any groin pain. She did have some complication perioperatively with hip. She says otherwise her pain does not radiate down the legs and has no paresthesias. She does carry a diagnosis of neuropathy does get some tingling in feet. She does have insulin-dependent diabetes. Her case is also complicated by morbid obesity and peripheral vascular disease. She tries to remain active but has difficulty standing for any length time. She finds the pain spreads to the lower back and somewhat in the hip but not down the legs. She's had no focal weakness or bowel or bladder issues. No specific trauma. She has failed care with therapy as well as medications. She has an MRI of the lumbar spine which is reviewed below.    Review of Systems  Constitutional: Negative for chills, fever, malaise/fatigue and weight loss.  HENT: Negative for hearing loss and sinus pain.   Eyes: Negative for blurred vision, double vision and photophobia.  Respiratory: Negative for cough and shortness of breath.   Cardiovascular: Positive for leg swelling. Negative for chest pain and palpitations.  Gastrointestinal: Negative for abdominal pain, nausea and vomiting.  Genitourinary: Negative for flank pain.  Musculoskeletal: Positive  for back pain. Negative for falls and myalgias.  Skin: Negative for itching and rash.  Neurological: Negative for tremors, focal weakness and weakness.  Endo/Heme/Allergies: Negative.   Psychiatric/Behavioral: Negative for depression.  All other systems reviewed and are negative.  Otherwise per HPI.  Assessment & Plan: Visit Diagnoses:  1. Spondylosis without myelopathy or radiculopathy, lumbar region   2. Chronic midline low back pain without sciatica     Plan: Findings:  Chronic worsening severe axial low back pain without really any referral to the legs. She has mainly pain with standing consistent with facet arthropathy. She does have facet arthropathy really at L3-for L4-5 and L5-S1. She does not have much in the way of central stenosis at all. She does have a small lateral recess disc protrusion at L1-2 is on the right and couldn't get someone some right hip pain but her main complaint really is her low back. We're going to complete bilateral facet joint blocks diagnostically and hopefully therapeutically. A long discussion with her about her comorbidities and diabetes. She should do well with the injection and would be a decent candidate for radiofrequency ablation should help. I spent more than 25 minutes speaking face-to-face with the patient with 50% of the time in counseling. As a side note she has had some treatment of her back in the past by Dr. Niel Hummer. We will try to obtain those notes just for sake of completion.    Meds & Orders: No orders of the defined types were placed in this encounter.  No orders of the defined types were placed in this  encounter.   Follow-up: Return for Bilateral L3 S4 and L4-5 facet joint blocks..   Procedures: No procedures performed  No notes on file   Clinical History: Lumbar spine MRI 10/29/2015 L1-2: There is a small right lateral recess disc protrusion with cephalad extension. The disc causes mild narrowing in the right lateral recess  without nerve root compression. The foramina are widely patent.  L2-3: Shallow disc bulge with mild ligamentum flavum thickening and facet arthropathy. The central canal and foramina remain open.  L3-4: Moderate facet arthropathy with some ligamentum flavum thickening. The central canal and foramina are open.  L4-5: Moderate facet arthropathy with ligamentum flavum thickening and a very shallow disc bulge. The central canal and foramina are widely patent.  L5-S1: Bilateral facet degenerative change and a minimal disc bulge without central canal or foraminal narrowing.  She reports that she quit smoking about 25 years ago. Her smoking use included Cigarettes. She quit after 20.00 years of use. She has never used smokeless tobacco.   Recent Labs  10/29/15 0408 02/27/16 1319  HGBA1C 8.1* 6.9*    Objective:  VS:  HT:    WT:   BMI:     BP:(!) 156/57  HR:(!) 58bpm  TEMP: ( )  RESP:  Physical Exam  Constitutional: She is oriented to person, place, and time. She appears well-developed and well-nourished. No distress.  Obese  HENT:  Head: Normocephalic and atraumatic.  Nose: Nose normal.  Mouth/Throat: Oropharynx is clear and moist.  Eyes: Conjunctivae are normal. Pupils are equal, round, and reactive to light.  Neck: Normal range of motion. Neck supple.  Cardiovascular: Normal rate and intact distal pulses.   Pulmonary/Chest: Effort normal and breath sounds normal.  Abdominal: She exhibits no distension.  Musculoskeletal: She exhibits edema.  Patient stands with difficulty with forward flexed spine. She has pain with extension of the lumbar spine. No pain over the greater trochanters or with hip rotation and she has good strength distally without clonus.  Neurological: She is alert and oriented to person, place, and time. No sensory deficit. She exhibits normal muscle tone. Coordination normal.  Skin: Skin is warm and dry. Capillary refill takes less than 2 seconds. No rash  noted. No erythema.  Psychiatric: She has a normal mood and affect. Her behavior is normal.  Nursing note and vitals reviewed.   Ortho Exam Imaging: No results found.  Past Medical/Family/Surgical/Social History: Medications & Allergies reviewed per EMR Patient Active Problem List   Diagnosis Date Noted  . Presence of right artificial knee joint 04/30/2016  . Postoperative wound infection of right hip 04/24/2016  . 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 Degenerative arthritis of hip 03/06/2016  . Gout of left knee 10/31/2015  . Right hip pain 10/29/2015  . Acute pain of left knee 10/29/2015  . DM type 2 causing CKD stage 3 ([place]) 10/29/2015  . Morbid obesity ([place]) 10/29/2015  . CKD (chronic kidney disease) stage 4, GFR 15-29 ml/min (HCC) 04/18/2014  . Aortic atherosclerosis ([place]) 04/18/2014  . Bilateral carotid bruits 04/16/2014  . Pulsatile tinnitus of right ear 04/16/2014  . Diarrhea 09/18/2011  . Peripheral vascular disease, unspecified 08/14/2011  . Claudication ([place]) 08/14/2011  . Edema 06/29/2011  . Leucocytosis 06/12/2011  . Acute on chronic kidney disease, stage 4 05/28/2011  . Respiratory failure, acute ([place]) 05/25/2011    Class: Acute  . S/P aorto-bifemoral bypass surgery 05/25/2011    Class: Acute  . PAD (peripheral artery disease) ([place])   . Coronary  artery disease   . Hyperlipidemia   . Hypertension    Past Medical History:  Diagnosis Date  . Anxiety   . Carotid artery occlusion   . CKD (chronic kidney disease) stage 3, GFR 30-59 ml/min 04/18/2014  . Claudication ([place])   . Constipation    Due to patient taking iron supplements  . Coronary artery disease   . DDD (degenerative disc disease)   . Diabetes mellitus   . Diabetic coma ([place])   . DJD (degenerative joint disease)   . DJD (degenerative joint disease)   . Edema    Bilateral lower extermities  . Gout   . Hyperlipidemia   . Hypertension   . Iron deficiency anemia   . Leg pain   .  Obesity   . PAD (peripheral artery disease) ([place])   . Shortness of breath    exertion   Family History  Problem Relation Age of Onset  . Hypertension Mother   . Coronary artery disease Father   . Hypertension Sister   . Cirrhosis Brother   . Kidney disease Daughter    Past Surgical History:  Procedure Laterality Date  . AORTA - BILATERAL FEMORAL ARTERY BYPASS GRAFT  05/25/2011   Procedure: AORTA BIFEMORAL BYPASS GRAFT;  Surgeon: Mal Misty, MD;  Location: [facility];  Service: Vascular;  Laterality: N/A;  . APPLICATION OF WOUND VAC Right 04/23/2016   Procedure: APPLICATION OF WOUND VAC CHANGED;  Surgeon: Meredith Pel, MD;  Location: [facility];  Service: Orthopedics;  Laterality: Right;  . BREAST SURGERY    . CARDIAC CATHETERIZATION  12/01/2009   EF 65%  . CARDIOVASCULAR STRESS TEST  11/28/2009   EF 75%  . COLONOSCOPY W/ POLYPECTOMY    . CORONARY ARTERY BYPASS GRAFT  12/08/2009   LIMA GRAFT TO THE DISTAL LAD AND SAPHENOUS VEIN GRAFT TO THE OBTUSE MARGINAL VESSEL  . EYE SURGERY    . I&D EXTREMITY Right 04/23/2016   Procedure: IRRIGATION AND DEBRIDEMENT EXTREMITY;  Surgeon: Meredith Pel, MD;  Location: [facility];  Service: Orthopedics;  Laterality: Right;  . INCISION AND DRAINAGE HIP Right 04/20/2016   Procedure: IRRIGATION AND DEBRIDEMENT HIP WITH POLY EXCHANGE, ABX BEAD PLACEMENT, WOUND VAC ;  Surgeon: Meredith Pel, MD;  Location: [facility];  Service: Orthopedics;  Laterality: Right;  RIGHT HIP SUPERFICIAL I&D, POSSIBLE DEEP I&D, LINER EXCHANGE, ABX BEAD PLACEMENT, WOUND VAC.   Marland Kitchen PR VEIN BYPASS GRAFT,AORTO-FEM-POP  05/25/11  . REMOVAL OF FIBROUS CYST FROM RIGHT BREAST  10+ YEARS  . RETINAL DETACHMENT SURGERY  10+ YEARS   LEFT EYE  . TOTAL HIP ARTHROPLASTY Right 03/06/2016  . TOTAL HIP ARTHROPLASTY Right 03/06/2016   Procedure: RIGHT TOTAL HIP ARTHROPLASTY ANTERIOR APPROACH;  Surgeon: Meredith Pel, MD;  Location: [facility];  Service: Orthopedics;  Laterality: Right;  .  TRANSTHORACIC ECHOCARDIOGRAM  12/01/2009   EF 60-65%   Social History   Occupational History  . [employer] Retired    retired   Social History Main Topics  . Smoking status: Former Smoker    Years: 20.00    Types: Cigarettes    Quit date: 07/10/1991  . Smokeless tobacco: Never Used  . Alcohol use No  . Drug use: No  . Sexual activity: No

## 2016-08-02 ENCOUNTER — Telehealth (INDEPENDENT_AMBULATORY_CARE_PROVIDER_SITE_OTHER): Payer: Self-pay | Admitting: *Deleted

## 2016-08-02 MED ORDER — AMOXICILLIN 500 MG PO CAPS: ORAL_CAPSULE | ORAL | 2 refills | Status: DC

## 2016-08-02 NOTE — Telephone Encounter (Signed)
Patient called in this morning in regards to needing an antibiotic? She is having a teeth cleaning on Monday of next week. She usually gets Amoxicillin normally for this if possible? Her CB # [phone]

## 2016-08-02 NOTE — Telephone Encounter (Signed)
Rx sent to pharm, [facility] patient and advised.

## 2016-08-02 NOTE — Addendum Note (Signed)
Addended byBrand Males on: 08/02/2016 11:28 AM   Modules accepted: Orders

## 2016-08-02 NOTE — Telephone Encounter (Signed)
Ok to leave VM on pt phone if she does not answer.

## 2016-08-06 ENCOUNTER — Telehealth: Payer: Self-pay | Admitting: Internal Medicine

## 2016-08-06 NOTE — Telephone Encounter (Signed)
Pt called to r/s ov appt that was cxl. Gave pt new appt date/time

## 2016-08-07 ENCOUNTER — Encounter (INDEPENDENT_AMBULATORY_CARE_PROVIDER_SITE_OTHER): Payer: Self-pay | Admitting: Physical Medicine and Rehabilitation

## 2016-08-07 ENCOUNTER — Ambulatory Visit (INDEPENDENT_AMBULATORY_CARE_PROVIDER_SITE_OTHER): Payer: Self-pay

## 2016-08-07 ENCOUNTER — Ambulatory Visit (INDEPENDENT_AMBULATORY_CARE_PROVIDER_SITE_OTHER): Payer: Medicare Other | Admitting: Physical Medicine and Rehabilitation

## 2016-08-07 VITALS — BP 167/60 | HR 72 | Temp 98.1°F

## 2016-08-07 DIAGNOSIS — M47816 Spondylosis without myelopathy or radiculopathy, lumbar region: Secondary | ICD-10-CM | POA: Diagnosis not present

## 2016-08-07 MED ORDER — LIDOCAINE HCL (PF) 1 % IJ SOLN
0.3300 mL | Freq: Once | INTRAMUSCULAR | Status: DC
Start: 2016-08-07 — End: 2017-04-22

## 2016-08-07 MED ORDER — METHYLPREDNISOLONE ACETATE 80 MG/ML IJ SUSP: 80.0000 mg | Freq: Once | INTRAMUSCULAR | Status: DC

## 2016-08-07 NOTE — Patient Instructions (Signed)

## 2016-08-07 NOTE — Progress Notes (Signed)
Norma [name] - 71 y.o. female MRN [id number]  Date of birth: 02-15-1946  Office Visit Note: Visit Date: 08/07/2016 PCP: Bartholome Bill, MD Referred by: Bartholome Bill, MD  Subjective: Chief Complaint  Patient presents with  . Lower Back - Pain   HPI: Norma [name] is a 71 year old female that we recently saw evaluation and management and to is here today for planned bilateral facet injections/blocks at L3-4 and L4-5. No change in symptoms.    ROS Otherwise per HPI.  Assessment & Plan: Visit Diagnoses:  1. Spondylosis without myelopathy or radiculopathy, lumbar region     Plan: Findings:  Bilateral facet joint blocks with fluoroscopic guidance at L3 Devashwar L4-5.    Meds & Orders:  Meds ordered this encounter  Medications  . lidocaine (PF) (XYLOCAINE) 1 % injection 0.3 mL  . methylPREDNISolone acetate (DEPO-MEDROL) injection 80 mg    Orders Placed This Encounter  Procedures  . Facet Injection  . XR C-ARM NO REPORT    Follow-up: Return if symptoms worsen or fail to improve after 2 weeks.   Procedures: No procedures performed  Lumbar Facet Joint Intra-Articular Injection(s) with Fluoroscopic Guidance  Patient: Norma [name]      Date of Birth: 1945/07/10 MRN: [id number] PCP: Bartholome Bill, MD      Visit Date: 08/07/2016   Universal Protocol:    Date/Time: 01/31/185:52 AM  Consent Given By: the patient  Position: PRONE   Additional Comments: Vital signs were monitored before and after the procedure. Patient was prepped and draped in the usual sterile fashion. The correct patient, procedure, and site was verified.   Injection Procedure Details:  Procedure Site One Meds Administered:  Meds ordered this encounter  Medications  . lidocaine (PF) (XYLOCAINE) 1 % injection 0.3 mL  . methylPREDNISolone acetate (DEPO-MEDROL) injection 80 mg     Laterality: Bilateral  Location/Site:  L3-L4 L4-L5  Needle size: 22 guage  Needle type:  Spinal  Needle Placement: Articular  Findings:  -Contrast Used: 1 mL iohexol 180 mg iodine/mL   -Comments: Excellent flow of contrast producing a partial arthrogram.  Procedure Details: The fluoroscope beam is vertically oriented in AP, and the inferior recess is visualized beneath the lower pole of the inferior apophyseal process, which represents the target point for needle insertion. When direct visualization is difficult the target point is located at the medial projection of the vertebral pedicle. The region overlying each aforementioned target is locally anesthetized with a 1 to 2 ml. volume of 1% Lidocaine without Epinephrine.   The spinal needle was inserted into each of the above mentioned facet joints using biplanar fluoroscopic guidance. A 0.25 to 0.5 ml. volume of Isovue-250 was injected and a partial facet joint arthrogram was obtained. A single spot film was obtained of the resulting arthrogram.    One to 1.25 ml of the steroid/anesthetic solution was then injected into each of the facet joints noted above.   Additional Comments:  The patient tolerated the procedure well Dressing: Band-Aid    Post-procedure details: Patient was observed during the procedure. Post-procedure instructions were reviewed.  Patient left the clinic in stable condition.     Clinical History: Lumbar spine MRI 10/29/2015 L1-2: There is a small right lateral recess disc protrusion with cephalad extension. The disc causes mild narrowing in the right lateral recess without nerve root compression. The foramina are widely patent.  L2-3: Shallow disc bulge with mild ligamentum flavum thickening and facet arthropathy. The central canal and  foramina remain open.  L3-4: Moderate facet arthropathy with some ligamentum flavum thickening. The central canal and foramina are open.  L4-5: Moderate facet arthropathy with ligamentum flavum thickening and a very shallow disc bulge. The central canal and  foramina are widely patent.  L5-S1: Bilateral facet degenerative change and a minimal disc bulge without central canal or foraminal narrowing.  She reports that she quit smoking about 25 years ago. Her smoking use included Cigarettes. She quit after 20.00 years of use. She has never used smokeless tobacco.   Recent Labs  10/29/15 0408 02/27/16 1319  HGBA1C 8.1* 6.9*    Objective:  VS:  HT:    WT:   BMI:     BP:(!) 167/60  HR:72bpm  TEMP:98.1 F (36.7 C)(Oral)  RESP:96 % Physical Exam  Musculoskeletal:  She has good distal strength with concordant pain on extension    Ortho Exam Imaging: Xr C-arm No Report  Result Date: 08/07/2016 Please see Notes or Procedures tab for imaging impression.   Past Medical/Family/Surgical/Social History: Medications & Allergies reviewed per EMR Patient Active Problem List   Diagnosis Date Noted  . Presence of right artificial knee joint 04/30/2016  . Postoperative wound infection of right hip 04/24/2016  . 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 Degenerative arthritis of hip 03/06/2016  . Gout of left knee 10/31/2015  . Right hip pain 10/29/2015  . Acute pain of left knee 10/29/2015  . DM type 2 causing CKD stage 3 ([place]) 10/29/2015  . Morbid obesity ([place]) 10/29/2015  . CKD (chronic kidney disease) stage 4, GFR 15-29 ml/min (HCC) 04/18/2014  . Aortic atherosclerosis ([place]) 04/18/2014  . Bilateral carotid bruits 04/16/2014  . Pulsatile tinnitus of right ear 04/16/2014  . Diarrhea 09/18/2011  . Peripheral vascular disease, unspecified 08/14/2011  . Claudication ([place]) 08/14/2011  . Edema 06/29/2011  . Leucocytosis 06/12/2011  . Acute on chronic kidney disease, stage 4 05/28/2011  . Respiratory failure, acute ([place]) 05/25/2011    Class: Acute  . S/P aorto-bifemoral bypass surgery 05/25/2011    Class: Acute  . PAD (peripheral artery disease) ([place])   . Coronary artery disease   . Hyperlipidemia   . Hypertension    Past  Medical History:  Diagnosis Date  . Anxiety   . Carotid artery occlusion   . CKD (chronic kidney disease) stage 3, GFR 30-59 ml/min 04/18/2014  . Claudication ([place])   . Constipation    Due to patient taking iron supplements  . Coronary artery disease   . DDD (degenerative disc disease)   . Diabetes mellitus   . Diabetic coma ([place])   . DJD (degenerative joint disease)   . DJD (degenerative joint disease)   . Edema    Bilateral lower extermities  . Gout   . Hyperlipidemia   . Hypertension   . Iron deficiency anemia   . Leg pain   . Obesity   . PAD (peripheral artery disease) ([place])   . Shortness of breath    exertion   Family History  Problem Relation Age of Onset  . Hypertension Mother   . Coronary artery disease Father   . Hypertension Sister   . Cirrhosis Brother   . Kidney disease Daughter    Past Surgical History:  Procedure Laterality Date  . AORTA - BILATERAL FEMORAL ARTERY BYPASS GRAFT  05/25/2011   Procedure: AORTA BIFEMORAL BYPASS GRAFT;  Surgeon: Mal Misty, MD;  Location: [facility];  Service: Vascular;  Laterality: N/A;  . APPLICATION OF WOUND  VAC Right 04/23/2016   Procedure: APPLICATION OF WOUND VAC CHANGED;  Surgeon: Meredith Pel, MD;  Location: [facility];  Service: Orthopedics;  Laterality: Right;  . BREAST SURGERY    . CARDIAC CATHETERIZATION  12/01/2009   EF 65%  . CARDIOVASCULAR STRESS TEST  11/28/2009   EF 75%  . COLONOSCOPY W/ POLYPECTOMY    . CORONARY ARTERY BYPASS GRAFT  12/08/2009   LIMA GRAFT TO THE DISTAL LAD AND SAPHENOUS VEIN GRAFT TO THE OBTUSE MARGINAL VESSEL  . EYE SURGERY    . I&D EXTREMITY Right 04/23/2016   Procedure: IRRIGATION AND DEBRIDEMENT EXTREMITY;  Surgeon: Meredith Pel, MD;  Location: [facility];  Service: Orthopedics;  Laterality: Right;  . INCISION AND DRAINAGE HIP Right 04/20/2016   Procedure: IRRIGATION AND DEBRIDEMENT HIP WITH POLY EXCHANGE, ABX BEAD PLACEMENT, WOUND VAC ;  Surgeon: Meredith Pel, MD;  Location: [facility];  Service: Orthopedics;  Laterality: Right;  RIGHT HIP SUPERFICIAL I&D, POSSIBLE DEEP I&D, LINER EXCHANGE, ABX BEAD PLACEMENT, WOUND VAC.   Marland Kitchen PR VEIN BYPASS GRAFT,AORTO-FEM-POP  05/25/11  . REMOVAL OF FIBROUS CYST FROM RIGHT BREAST  10+ YEARS  . RETINAL DETACHMENT SURGERY  10+ YEARS   LEFT EYE  . TOTAL HIP ARTHROPLASTY Right 03/06/2016  . TOTAL HIP ARTHROPLASTY Right 03/06/2016   Procedure: RIGHT TOTAL HIP ARTHROPLASTY ANTERIOR APPROACH;  Surgeon: Meredith Pel, MD;  Location: [facility];  Service: Orthopedics;  Laterality: Right;  . TRANSTHORACIC ECHOCARDIOGRAM  12/01/2009   EF 60-65%   Social History   Occupational History  . [employer] Retired    retired   Social History Main Topics  . Smoking status: Former Smoker    Years: 20.00    Types: Cigarettes    Quit date: 07/10/1991  . Smokeless tobacco: Never Used  . Alcohol use No  . Drug use: No  . Sexual activity: No

## 2016-08-08 NOTE — Procedures (Signed)
Lumbar Facet Joint Intra-Articular Injection(s) with Fluoroscopic Guidance  Patient: Norma [name]      Date of Birth: [birth year] MRN: [id number] PCP: Bartholome Bill, MD      Visit Date: 08/07/2016   Universal Protocol:    Date/Time: 01/31/185:52 AM  Consent Given By: the patient  Position: PRONE   Additional Comments: Vital signs were monitored before and after the procedure. Patient was prepped and draped in the usual sterile fashion. The correct patient, procedure, and site was verified.   Injection Procedure Details:  Procedure Site One Meds Administered:  Meds ordered this encounter  Medications  . lidocaine (PF) (XYLOCAINE) 1 % injection 0.3 mL  . methylPREDNISolone acetate (DEPO-MEDROL) injection 80 mg     Laterality: Bilateral  Location/Site:  L3-L4 L4-L5  Needle size: 22 guage  Needle type: Spinal  Needle Placement: Articular  Findings:  -Contrast Used: 1 mL iohexol 180 mg iodine/mL   -Comments: Excellent flow of contrast producing a partial arthrogram.  Procedure Details: The fluoroscope beam is vertically oriented in AP, and the inferior recess is visualized beneath the lower pole of the inferior apophyseal process, which represents the target point for needle insertion. When direct visualization is difficult the target point is located at the medial projection of the vertebral pedicle. The region overlying each aforementioned target is locally anesthetized with a 1 to 2 ml. volume of 1% Lidocaine without Epinephrine.   The spinal needle was inserted into each of the above mentioned facet joints using biplanar fluoroscopic guidance. A 0.25 to 0.5 ml. volume of Isovue-250 was injected and a partial facet joint arthrogram was obtained. A single spot film was obtained of the resulting arthrogram.    One to 1.25 ml of the steroid/anesthetic solution was then injected into each of the facet joints noted above.   Additional Comments:  The patient  tolerated the procedure well Dressing: Band-Aid    Post-procedure details: Patient was observed during the procedure. Post-procedure instructions were reviewed.  Patient left the clinic in stable condition.

## 2016-08-22 ENCOUNTER — Ambulatory Visit: Payer: Medicare Other

## 2016-08-27 ENCOUNTER — Telehealth: Payer: Self-pay | Admitting: Internal Medicine

## 2016-08-27 ENCOUNTER — Ambulatory Visit: Payer: Medicare Other

## 2016-08-27 ENCOUNTER — Ambulatory Visit (HOSPITAL_COMMUNITY)
Admission: RE | Admit: 2016-08-27 | Discharge: 2016-08-27 | Disposition: A | Discharge status: Home / Self Care | Payer: Medicare Other | Source: Ambulatory Visit | Attending: Internal Medicine | Admitting: Internal Medicine

## 2016-08-27 ENCOUNTER — Ambulatory Visit (HOSPITAL_BASED_OUTPATIENT_CLINIC_OR_DEPARTMENT_OTHER): Payer: Medicare Other | Admitting: Internal Medicine

## 2016-08-27 ENCOUNTER — Ambulatory Visit (HOSPITAL_BASED_OUTPATIENT_CLINIC_OR_DEPARTMENT_OTHER): Payer: Medicare Other

## 2016-08-27 ENCOUNTER — Encounter: Payer: Self-pay | Admitting: Internal Medicine

## 2016-08-27 VITALS — BP 150/38 | HR 53 | Temp 97.8°F | Resp 17 | Ht 62.0 in | Wt 214.5 lb

## 2016-08-27 DIAGNOSIS — N183 Chronic kidney disease, stage 3 (moderate): Secondary | ICD-10-CM | POA: Diagnosis not present

## 2016-08-27 DIAGNOSIS — N184 Chronic kidney disease, stage 4 (severe): Secondary | ICD-10-CM | POA: Insufficient documentation

## 2016-08-27 DIAGNOSIS — D631 Anemia in chronic kidney disease: Secondary | ICD-10-CM

## 2016-08-27 DIAGNOSIS — D638 Anemia in other chronic diseases classified elsewhere: Secondary | ICD-10-CM

## 2016-08-27 DIAGNOSIS — D509 Iron deficiency anemia, unspecified: Secondary | ICD-10-CM

## 2016-08-27 LAB — COMPREHENSIVE METABOLIC PANEL
ALT: 42 U/L (ref 0–55)
ANION GAP: 7 meq/L (ref 3–11)
AST: 37 U/L — ABNORMAL HIGH (ref 5–34)
Albumin: 2.6 g/dL — ABNORMAL LOW (ref 3.5–5.0)
Alkaline Phosphatase: 145 U/L (ref 40–150)
BUN: 55.6 mg/dL — ABNORMAL HIGH (ref 7.0–26.0)
CHLORIDE: 119 meq/L — AB (ref 98–109)
CO2: 18 meq/L — AB (ref 22–29)
CREATININE: 1.4 mg/dL — AB (ref 0.6–1.1)
Calcium: 8.5 mg/dL (ref 8.4–10.4)
EGFR: 44 mL/min/{1.73_m2} — ABNORMAL LOW (ref >90)
Glucose: 249 mg/dl — ABNORMAL HIGH (ref 70–140)
Potassium: 5.1 mEq/L (ref 3.5–5.1)
Sodium: 143 mEq/L (ref 136–145)
Total Bilirubin: 0.67 mg/dL (ref 0.20–1.20)
Total Protein: 5.4 g/dL — ABNORMAL LOW (ref 6.4–8.3)

## 2016-08-27 LAB — CBC WITH DIFFERENTIAL/PLATELET
BASO%: 0.2 % (ref 0.0–2.0)
Basophils Absolute: 0 10*3/uL (ref 0.0–0.1)
EOS ABS: 0.1 10*3/uL (ref 0.0–0.5)
EOS%: 1.4 % (ref 0.0–7.0)
HCT: 24.8 % — ABNORMAL LOW (ref 34.8–46.6)
HGB: 7.5 g/dL — ABNORMAL LOW (ref 11.6–15.9)
LYMPH#: 0.6 10*3/uL — AB (ref 0.9–3.3)
LYMPH%: 10.1 % — ABNORMAL LOW (ref 14.0–49.7)
MCH: 26.2 pg (ref 25.1–34.0)
MCHC: 30.2 g/dL — ABNORMAL LOW (ref 31.5–36.0)
MCV: 86.7 fL (ref 79.5–101.0)
MONO#: 0.3 10*3/uL (ref 0.1–0.9)
MONO%: 5.9 % (ref 0.0–14.0)
NEUT%: 82.4 % — ABNORMAL HIGH (ref 38.4–76.8)
NEUTROS ABS: 4.8 10*3/uL (ref 1.5–6.5)
PLATELETS: 195 10*3/uL (ref 145–400)
RBC: 2.86 10*6/uL — ABNORMAL LOW (ref 3.70–5.45)
RDW: 23.7 % — ABNORMAL HIGH (ref 11.2–14.5)
WBC: 5.8 10*3/uL (ref 3.9–10.3)

## 2016-08-27 LAB — IRON AND TIBC
%SAT: 19 % — AB (ref 21–57)
IRON: 47 ug/dL (ref 41–142)
TIBC: 246 ug/dL (ref 236–444)
UIBC: 200 ug/dL (ref 120–384)

## 2016-08-27 LAB — FERRITIN: FERRITIN: 99 ng/mL (ref 9–269)

## 2016-08-27 LAB — PREPARE RBC (CROSSMATCH)

## 2016-08-27 LAB — ABO/RH: ABO/RH(D): A POS

## 2016-08-27 NOTE — Addendum Note (Signed)
Addended by: Lucile Crater on: 08/27/2016 11:28 AM   Modules accepted: Orders, SmartSet

## 2016-08-27 NOTE — Telephone Encounter (Signed)
Appointments scheduled per 08/27/16 los. Patient was given a copy of the AVS report and appointment schedule, per 08/27/16 los.

## 2016-08-27 NOTE — Progress Notes (Signed)
[facility] [phone]   [phone]  OFFICE PROGRESS NOTE  Bartholome Bill, MD [address] [place] [address]  DIAGNOSIS: Persistent normocytic anemia of unclear etiology questionable to be anemia of chronic disease plus/minus iron deficiency  PRIOR THERAPY: None  CURRENT THERAPY: Over-the-counter ferrous sulfate.  INTERVAL HISTORY: Norma [name] 71 y.o. female came to the clinic today for follow-up visit accompanied by a family member. The patient has been complaining of increasing fatigue and weakness as well as shortness of breath with minimal exertion. She recently underwent right hip replacement. Her hemoglobin and hematocrit were low at that time that I'm not sure if the patient received any transfusion during her admission. She denied having any fever or chills. She has no nausea, vomiting, diarrhea or constipation. She is currently on oral iron tablet and tolerating it well.  MEDICAL HISTORY: Past Medical History:  Diagnosis Date  . Anxiety   . Carotid artery occlusion   . CKD (chronic kidney disease) stage 3, GFR 30-59 ml/min 04/18/2014  . Claudication ([place])   . Constipation    Due to patient taking iron supplements  . Coronary artery disease   . DDD (degenerative disc disease)   . Diabetes mellitus   . Diabetic coma ([place])   . DJD (degenerative joint disease)   . DJD (degenerative joint disease)   . Edema    Bilateral lower extermities  . Gout   . Hyperlipidemia   . Hypertension   . Iron deficiency anemia   . Leg pain   . Obesity   . PAD (peripheral artery disease) ([place])   . Shortness of breath    exertion    ALLERGIES:  is allergic to iohexol and sulfa antibiotics.  MEDICATIONS:  Current Outpatient Prescriptions  Medication Sig Dispense Refill  . allopurinol (ZYLOPRIM) 100 MG tablet Take 100 mg by mouth daily as needed.    Marland Kitchen amoxicillin (AMOXIL) 500 MG capsule Take 4  tablets by mouth 1 hour prior to dental cleaning. 12 capsule 2  . aspirin EC 325 MG EC tablet Take 1 tablet (325 mg total) by mouth daily with breakfast. 30 tablet 0  . atorvastatin (LIPITOR) 80 MG tablet Take 1 tablet (80 mg total) by mouth daily. (Patient taking differently: Take 80 mg by mouth every morning. ) 30 tablet 6  . bisacodyl (DULCOLAX) 10 MG suppository Place 1 suppository (10 mg total) rectally daily as needed for moderate constipation. (Patient not taking: Reported on 08/01/2016) 12 suppository 0  . carvedilol (COREG) 12.5 MG tablet Take 1 tablet (12.5 mg total) by mouth 2 (two) times daily. 180 tablet 3  . cephALEXin (KEFLEX) 500 MG capsule Take 1 capsule (500 mg total) by mouth 3 (three) times daily. (Patient not taking: Reported on 07/18/2016) 30 capsule 1  . cholecalciferol (VITAMIN D) 1000 units tablet Take 1,000 Units by mouth daily as needed (supplement).    . cloNIDine (CATAPRES) 0.3 MG tablet Take 0.3 mg by mouth 2 (two) times daily.     . Coenzyme Q10 200 MG capsule Take 200 mg by mouth every morning.     . ferrous sulfate 325 (65 FE) MG tablet Take 325 mg by mouth daily with breakfast.    . furosemide (LASIX) 80 MG tablet Take 80 mg by mouth daily.     Marland Kitchen glucose blood test strip 1.8 each by Other route as needed for other. Use as instructed    .  hydrALAZINE (APRESOLINE) 50 MG tablet Take 50 mg by mouth 3 (three) times daily.    Marland Kitchen HYDROcodone-acetaminophen (NORCO) 10-325 MG tablet Take 1 tablet by mouth every 6 (six) hours as needed for moderate pain. (Patient not taking: Reported on 08/01/2016) 60 tablet 0  . HYDROcodone-acetaminophen (NORCO/VICODIN) 5-325 MG tablet Take 1 tablet by mouth every 6 (six) hours as needed for moderate pain. 60 tablet 0  . insulin glargine (LANTUS) 100 UNIT/ML injection Inject 0.2 mLs (20 Units total) into the skin 2 (two) times daily. 10 mL 11  . isosorbide dinitrate (ISORDIL) 20 MG tablet Take 40 mg by mouth 2 (two) times daily.     Marland Kitchen liraglutide  (VICTOZA) 18 MG/3ML SOPN Inject 1.8 mg into the skin every morning.    Marland Kitchen losartan (COZAAR) 100 MG tablet Take 100 mg by mouth daily.    . Magnesium 250 MG TABS Take 250 mg by mouth every morning.     . menthol-cetylpyridinium (CEPACOL) 3 MG lozenge Take 1 lozenge (3 mg total) by mouth as needed for sore throat (sore throat). (Patient not taking: Reported on 07/18/2016) 100 tablet 12  . ondansetron (ZOFRAN) 4 MG tablet Take 1 tablet (4 mg total) by mouth every 6 (six) hours as needed for nausea. (Patient not taking: Reported on 08/01/2016) 20 tablet 0  . OVER THE COUNTER MEDICATION Over the counter laxative from [facility]    . Probiotic Product (PROBIOTIC DAILY PO) Take 1 tablet by mouth daily as needed (supplement).     . vitamin B-12 (CYANOCOBALAMIN) 500 MCG tablet Take 500 mcg by mouth every morning.      Current Facility-Administered Medications  Medication Dose Route Frequency Provider Last Rate Last Dose  . lidocaine (PF) (XYLOCAINE) 1 % injection 0.3 mL  0.3 mL Other Once Magnus Sinning, MD      . methylPREDNISolone acetate (DEPO-MEDROL) injection 80 mg  80 mg Other Once Magnus Sinning, MD        SURGICAL HISTORY:  Past Surgical History:  Procedure Laterality Date  . AORTA - BILATERAL FEMORAL ARTERY BYPASS GRAFT  05/25/2011   Procedure: AORTA BIFEMORAL BYPASS GRAFT;  Surgeon: Mal Misty, MD;  Location: [facility];  Service: Vascular;  Laterality: N/A;  . APPLICATION OF WOUND VAC Right 04/23/2016   Procedure: APPLICATION OF WOUND VAC CHANGED;  Surgeon: Meredith Pel, MD;  Location: [facility];  Service: Orthopedics;  Laterality: Right;  . BREAST SURGERY    . CARDIAC CATHETERIZATION  12/01/2009   EF 65%  . CARDIOVASCULAR STRESS TEST  11/28/2009   EF 75%  . COLONOSCOPY W/ POLYPECTOMY    . CORONARY ARTERY BYPASS GRAFT  12/08/2009   LIMA GRAFT TO THE DISTAL LAD AND SAPHENOUS VEIN GRAFT TO THE OBTUSE MARGINAL VESSEL  . EYE SURGERY    . I&D EXTREMITY Right 04/23/2016   Procedure: IRRIGATION  AND DEBRIDEMENT EXTREMITY;  Surgeon: Meredith Pel, MD;  Location: [facility];  Service: Orthopedics;  Laterality: Right;  . INCISION AND DRAINAGE HIP Right 04/20/2016   Procedure: IRRIGATION AND DEBRIDEMENT HIP WITH POLY EXCHANGE, ABX BEAD PLACEMENT, WOUND VAC ;  Surgeon: Meredith Pel, MD;  Location: [facility];  Service: Orthopedics;  Laterality: Right;  RIGHT HIP SUPERFICIAL I&D, POSSIBLE DEEP I&D, LINER EXCHANGE, ABX BEAD PLACEMENT, WOUND VAC.   Marland Kitchen PR VEIN BYPASS GRAFT,AORTO-FEM-POP  05/25/11  . REMOVAL OF FIBROUS CYST FROM RIGHT BREAST  10+ YEARS  . RETINAL DETACHMENT SURGERY  10+ YEARS   LEFT EYE  . TOTAL HIP  ARTHROPLASTY Right 03/06/2016  . TOTAL HIP ARTHROPLASTY Right 03/06/2016   Procedure: RIGHT TOTAL HIP ARTHROPLASTY ANTERIOR APPROACH;  Surgeon: Meredith Pel, MD;  Location: [facility];  Service: Orthopedics;  Laterality: Right;  . TRANSTHORACIC ECHOCARDIOGRAM  12/01/2009   EF 60-65%    REVIEW OF SYSTEMS:  A comprehensive review of systems was negative except for: Constitutional: positive for fatigue Respiratory: positive for dyspnea on exertion   PHYSICAL EXAMINATION: General appearance: alert, cooperative, fatigued and no distress Head: Normocephalic, without obvious abnormality, atraumatic Neck: no adenopathy, no JVD, supple, symmetrical, trachea midline and thyroid not enlarged, symmetric, no tenderness/mass/nodules Lymph nodes: Cervical, supraclavicular, and axillary nodes normal. Resp: clear to auscultation bilaterally Back: symmetric, no curvature. ROM normal. No CVA tenderness. Cardio: regular rate and rhythm, S1, S2 normal, no murmur, click, rub or gallop GI: soft, non-tender; bowel sounds normal; no masses,  no organomegaly Extremities: extremities normal, atraumatic, no cyanosis or edema  ECOG PERFORMANCE STATUS: 1 - Symptomatic but completely ambulatory  Blood pressure (!) 150/38, pulse (!) 53, temperature 97.8 F (36.6 C), temperature source Oral, resp. rate 17,  height 5\' 2"  (1.575 m), weight 214 lb 8 oz (97.3 kg), SpO2 96 %.  LABORATORY DATA: Lab Results  Component Value Date   WBC 6.5 04/20/2016   HGB 7.8 (L) 04/20/2016   HCT 25.7 (L) 04/20/2016   MCV 89.9 04/20/2016   PLT 207 04/20/2016      Chemistry      Component Value Date/Time   NA 142 04/24/2016 0852   NA 141 10/19/2015 1424   K 4.8 04/24/2016 0852   K 4.6 10/19/2015 1424   CL 113 (H) 04/24/2016 0852   CO2 23 04/24/2016 0852   CO2 23 10/19/2015 1424   BUN 48 (H) 04/24/2016 0852   BUN 72.3 (H) 10/19/2015 1424   CREATININE 2.69 (H) 04/24/2016 0852   CREATININE 2.5 (H) 10/19/2015 1424      Component Value Date/Time   CALCIUM 8.4 (L) 04/24/2016 0852   CALCIUM 8.7 10/19/2015 1424   ALKPHOS 76 10/19/2015 1424   AST 21 10/19/2015 1424   ALT 15 10/19/2015 1424   BILITOT 0.46 10/19/2015 1424       RADIOGRAPHIC STUDIES: Xr C-arm No Report  Result Date: 08/07/2016 Please see Notes or Procedures tab for imaging impression.   ASSESSMENT AND PLAN:  This is a very pleasant 71 years old African-American female with anemia of chronic disease secondary to renal insufficiency plus/minus iron deficiency. The patient is complaining of increasing fatigue and weakness recently as well as shortness breath. Her hemoglobin and hematocrit are low today. I discussed with the patient and consideration of PRBCs transfusion with 2 units. She will receive her transfusion tomorrow. I recommended for her to continue on the oral iron tablets for now. I may also consider her for treatment with Aranesp in the near future. I would see her back for follow-up visit in 3 months for reevaluation with repeat CBC and iron study. She was advised to call immediately if she has any concerning symptoms in the interval. The patient voices understanding of current disease status and treatment options and is in agreement with the current care plan.  All questions were answered. The patient knows to call the  clinic with any problems, questions or concerns. We can certainly see the patient much sooner if necessary. I spent 10 minutes counseling the patient face to face. The total time spent in the appointment was 15 minutes.  Disclaimer: This note was dictated  with voice recognition software. Similar sounding words can inadvertently be transcribed and may not be corrected upon review.

## 2016-08-28 ENCOUNTER — Ambulatory Visit (HOSPITAL_COMMUNITY)
Admission: RE | Admit: 2016-08-28 | Discharge: 2016-08-28 | Disposition: A | Discharge status: Home / Self Care | Payer: Medicare Other | Source: Ambulatory Visit | Attending: Internal Medicine | Admitting: Internal Medicine

## 2016-08-28 DIAGNOSIS — N184 Chronic kidney disease, stage 4 (severe): Secondary | ICD-10-CM | POA: Diagnosis not present

## 2016-08-28 DIAGNOSIS — D638 Anemia in other chronic diseases classified elsewhere: Secondary | ICD-10-CM

## 2016-08-28 MED ORDER — DIPHENHYDRAMINE HCL 25 MG PO CAPS
25.0000 mg | ORAL_CAPSULE | Freq: Once | ORAL | Status: AC
  Administered 2016-08-28: 25 mg via ORAL
  Filled 2016-08-28: qty 1

## 2016-08-28 MED ORDER — ACETAMINOPHEN 325 MG PO TABS
650.0000 mg | ORAL_TABLET | Freq: Once | ORAL | Status: AC
Start: 2016-08-28 — End: 2016-08-28
  Administered 2016-08-28: 650 mg via ORAL
  Filled 2016-08-28: qty 2

## 2016-08-28 MED ORDER — HEPARIN SOD (PORK) LOCK FLUSH 100 UNIT/ML IV SOLN: 250.0000 [IU] | INTRAVENOUS | Status: DC | PRN

## 2016-08-28 MED ORDER — SODIUM CHLORIDE 0.9% FLUSH: 10.0000 mL | INTRAVENOUS | Status: DC | PRN

## 2016-08-28 MED ORDER — SODIUM CHLORIDE 0.9% FLUSH: 3.0000 mL | INTRAVENOUS | Status: DC | PRN

## 2016-08-28 MED ORDER — HEPARIN SOD (PORK) LOCK FLUSH 100 UNIT/ML IV SOLN: 500.0000 [IU] | Freq: Every day | INTRAVENOUS | Status: DC | PRN

## 2016-08-28 MED ORDER — SODIUM CHLORIDE 0.9 % IV SOLN
250.0000 mL | Freq: Once | INTRAVENOUS | Status: AC
  Administered 2016-08-28: 250 mL via INTRAVENOUS

## 2016-08-28 NOTE — Procedures (Signed)
Diagnosis Association: Anemia of chronic disease (D63.8)  Provider: Mayme Genta  Procedure: Pt received 2 units of PRBCs  Pt tolerated well.  Post procedure: Pt alert,oriented and ambulatory to wheel chair at discharge. D/C instructions given with verbal understanding.

## 2016-08-28 NOTE — Discharge Instructions (Signed)

## 2016-08-29 LAB — TYPE AND SCREEN
ABO/RH(D): A POS
ANTIBODY SCREEN: NEGATIVE
Unit division: 0
Unit division: 0

## 2016-09-06 ENCOUNTER — Encounter: Payer: Self-pay | Admitting: Cardiology

## 2016-09-09 NOTE — Progress Notes (Signed)
Norma [name] Date of Birth: 02-03-1946 Medical Record #[id number]  History of Present Illness: Norma [name] is seen for followup CAD and PAD. She has a hsitory of  complex vascular surgery in November of 2012 including aorto-bifemoral bypass for aortic occlusive disease. She had CABG back in June of 2011. She has chronic swelling of her legs from venous insufficiency.  Echo in Feb. 2014  showed a normal EF and moderate PHTN and grade 1 diastolic dysfunction.. She has a history of bradycardia - she remains on a lower dose of her Coreg to 12.5 mg BID.  She has refractory HTN.  She has a history of pulsatile tinnitus.  Carotid dopplers in August 2015 showed no obstructive disease. She underwent CT of the aorta which showed no obstructive disease in the aorta or great vessels. There was no aneurysm. Unfortunately she developed contrast induced nephropathy with increased BUN to 85 and creatinine to 3.57. Her renal function later improved.   In September 2017 she underwent right THR. This was complicated by a wound infection that required debridement and wound VAC as well as antibiotics. She also has significant lumbar arthropathy. In February she was seen by hematology with complaints of fatigue and was severely anemic. Hgb 7.5. She was transfused 2 units PRBCs.   She is followed by hematology for chronic anemia. She sees Dr. Florene Glen for her CKD. She denies any significant chest pain. Did note more dyspnea and fatigue with anemia that has improved post transfusion. Feels occasional sensation in her chest of a grip that then releases. She is op a 5 day course of  Prednisone for left shoulder pain. BP on 08/29/16 was 142/70.   Current Outpatient Prescriptions  Medication Sig Dispense Refill  . allopurinol (ZYLOPRIM) 100 MG tablet Take 100 mg by mouth daily as needed.    Marland Kitchen aspirin EC 325 MG EC tablet Take 1 tablet (325 mg total) by mouth daily with breakfast. 30 tablet 0  . atorvastatin (LIPITOR) 80 MG tablet  Take 1 tablet (80 mg total) by mouth daily. (Patient taking differently: Take 80 mg by mouth every morning. ) 30 tablet 6  . bisacodyl (DULCOLAX) 10 MG suppository Place 1 suppository (10 mg total) rectally daily as needed for moderate constipation. 12 suppository 0  . carvedilol (COREG) 12.5 MG tablet Take 1 tablet (12.5 mg total) by mouth 2 (two) times daily. 180 tablet 3  . cholecalciferol (VITAMIN D) 1000 units tablet Take 1,000 Units by mouth daily as needed (supplement).    . cloNIDine (CATAPRES) 0.3 MG tablet Take 0.3 mg by mouth 2 (two) times daily.     . Coenzyme Q10 200 MG capsule Take 200 mg by mouth every morning.     . ferrous sulfate 325 (65 FE) MG tablet Take 325 mg by mouth daily with breakfast.    . furosemide (LASIX) 80 MG tablet Take 80 mg by mouth daily.     Marland Kitchen glucose blood test strip 1.8 each by Other route as needed for other. Use as instructed    . hydrALAZINE (APRESOLINE) 50 MG tablet Take 50 mg by mouth 3 (three) times daily.    Marland Kitchen HYDROcodone-acetaminophen (NORCO/VICODIN) 5-325 MG tablet Take 1 tablet by mouth every 6 (six) hours as needed for moderate pain. 60 tablet 0  . insulin glargine (LANTUS) 100 UNIT/ML injection Inject 0.2 mLs (20 Units total) into the skin 2 (two) times daily. 10 mL 11  . isosorbide dinitrate (ISORDIL) 20 MG tablet Take 40 mg by mouth  2 (two) times daily.     Marland Kitchen liraglutide (VICTOZA) 18 MG/3ML SOPN Inject 1.8 mg into the skin every morning.    Marland Kitchen losartan (COZAAR) 100 MG tablet Take 100 mg by mouth daily.    . Magnesium 250 MG TABS Take 250 mg by mouth every morning.     Marland Kitchen OVER THE COUNTER MEDICATION Over the counter laxative from [facility]    . predniSONE (DELTASONE) 10 MG tablet Take 30 mg by mouth daily with breakfast.    . Probiotic Product (PROBIOTIC DAILY PO) Take 1 tablet by mouth daily as needed (supplement).     . vitamin B-12 (CYANOCOBALAMIN) 500 MCG tablet Take 500 mcg by mouth every morning.      Current Facility-Administered Medications   Medication Dose Route Frequency Provider Last Rate Last Dose  . lidocaine (PF) (XYLOCAINE) 1 % injection 0.3 mL  0.3 mL Other Once Magnus Sinning, MD      . methylPREDNISolone acetate (DEPO-MEDROL) injection 80 mg  80 mg Other Once Magnus Sinning, MD        Allergies  Allergen Reactions  . Iohexol Itching and Rash     Code: RASH, Desc: pt called 1 day post scanning stating that skin was red and "itching all over" some what better but still had symptom.. instructed pt to take benadryl to relieve symptoms,per dr Alvester Chou.if any problems call back/mms, Onset Date: [id number]   . Sulfa Antibiotics Other (See Comments)    UNSPECIFIED REACTION     Past Medical History:  Diagnosis Date  . Anxiety   . Carotid artery occlusion   . CKD (chronic kidney disease) stage 3, GFR 30-59 ml/min 04/18/2014  . Claudication ([place])   . Constipation    Due to patient taking iron supplements  . Coronary artery disease   . DDD (degenerative disc disease)   . Diabetes mellitus   . Diabetic coma ([place])   . DJD (degenerative joint disease)   . DJD (degenerative joint disease)   . Edema    Bilateral lower extermities  . Gout   . Hyperlipidemia   . Hypertension   . Iron deficiency anemia   . Leg pain   . Obesity   . PAD (peripheral artery disease) ([place])   . Shortness of breath    exertion    Past Surgical History:  Procedure Laterality Date  . AORTA - BILATERAL FEMORAL ARTERY BYPASS GRAFT  05/25/2011   Procedure: AORTA BIFEMORAL BYPASS GRAFT;  Surgeon: Mal Misty, MD;  Location: [facility];  Service: Vascular;  Laterality: N/A;  . APPLICATION OF WOUND VAC Right 04/23/2016   Procedure: APPLICATION OF WOUND VAC CHANGED;  Surgeon: Meredith Pel, MD;  Location: [facility];  Service: Orthopedics;  Laterality: Right;  . BREAST SURGERY    . CARDIAC CATHETERIZATION  12/01/2009   EF 65%  . CARDIOVASCULAR STRESS TEST  11/28/2009   EF 75%  . COLONOSCOPY W/ POLYPECTOMY    . CORONARY ARTERY BYPASS GRAFT  12/08/2009    LIMA GRAFT TO THE DISTAL LAD AND SAPHENOUS VEIN GRAFT TO THE OBTUSE MARGINAL VESSEL  . EYE SURGERY    . I&D EXTREMITY Right 04/23/2016   Procedure: IRRIGATION AND DEBRIDEMENT EXTREMITY;  Surgeon: Meredith Pel, MD;  Location: [facility];  Service: Orthopedics;  Laterality: Right;  . INCISION AND DRAINAGE HIP Right 04/20/2016   Procedure: IRRIGATION AND DEBRIDEMENT HIP WITH POLY EXCHANGE, ABX BEAD PLACEMENT, WOUND VAC ;  Surgeon: Meredith Pel, MD;  Location: [facility];  Service:  Orthopedics;  Laterality: Right;  RIGHT HIP SUPERFICIAL I&D, POSSIBLE DEEP I&D, LINER EXCHANGE, ABX BEAD PLACEMENT, WOUND VAC.   Marland Kitchen PR VEIN BYPASS GRAFT,AORTO-FEM-POP  05/25/11  . REMOVAL OF FIBROUS CYST FROM RIGHT BREAST  10+ YEARS  . RETINAL DETACHMENT SURGERY  10+ YEARS   LEFT EYE  . TOTAL HIP ARTHROPLASTY Right 03/06/2016  . TOTAL HIP ARTHROPLASTY Right 03/06/2016   Procedure: RIGHT TOTAL HIP ARTHROPLASTY ANTERIOR APPROACH;  Surgeon: Meredith Pel, MD;  Location: [facility];  Service: Orthopedics;  Laterality: Right;  . TRANSTHORACIC ECHOCARDIOGRAM  12/01/2009   EF 60-65%    History  Smoking Status  . Former Smoker  . Years: 20.00  . Types: Cigarettes  . Quit date: 07/10/1991  Smokeless Tobacco  . Never Used    History  Alcohol Use No    Family History  Problem Relation Age of Onset  . Hypertension Mother   . Coronary artery disease Father   . Hypertension Sister   . Cirrhosis Brother   . Kidney disease Daughter     Review of Systems: The review of systems is per the HPI.   All other systems were reviewed and are negative.  Physical Exam: BP (!) 160/60   Pulse 74   Ht 5\' 2"  (1.575 m)   Wt 214 lb (97.1 kg)   BMI 39.14 kg/m  Patient is very pleasant and in no acute distress. She is in a wheelchair. She is obese. Skin is warm and dry. Color is normal.  HEENT is unremarkable. Normocephalic/atraumatic. PERRL. Sclera are nonicteric. Neck is supple. No masses. No JVD. There are bilateral loud  bruits R>L at the base of the neck and subclavian areas. Lungs are clear. Cardiac exam shows a regular rate and rhythm. Normal S1-2, no gallop or cardiac murmur. Abdomen is soft. NT, BS +. No masses. Extremities have 1+  Edema- compression hose on. Gait and ROM are intact. No gross neurologic deficits noted.  LABORATORY DATA:   Lab Results  Component Value Date   WBC 5.8 08/27/2016   HGB 7.5 (L) 08/27/2016   HCT 24.8 (L) 08/27/2016   PLT 195 08/27/2016   GLUCOSE 249 (H) 08/27/2016   CHOL 138 04/30/2014   TRIG 169 (H) 04/30/2014   HDL 29 (L) 04/30/2014   LDLCALC 75 04/30/2014   ALT 42 08/27/2016   AST 37 (H) 08/27/2016   NA 143 08/27/2016   K 5.1 08/27/2016   CL 113 (H) 04/24/2016   CREATININE 1.4 (H) 08/27/2016   BUN 55.6 (H) 08/27/2016   CO2 18 (L) 08/27/2016   TSH 2.193 04/16/2014   INR 1.09 01/18/2015   HGBA1C 6.9 (H) 02/27/2016   Labs dated 08/15/16: A1c 8.1%. Cholesterol 181, triglycerides 93, HDL 62, LDL 100.   Ecg today shows NSR with ST- T changes c/w inferior ischemia- unchanged from prior. I have personally reviewed and interpreted this study.   Assessment / Plan: 1. HTN - Blood pressure has been difficult to control - on multiple medications. Continue current therapy.   I have made no changes today.   2. CAD - with past CABG - asymptomatic.   3. PVD - with past aortobifem bypass in 2012 - by Dr. Kellie Simmering  4. Palpitations. Event monitor was unremarkable except for benign PACs.   5. Severe aortic atherosclerosis. No obstructive disease or aneurysm. Her pulsatile tinnitus is related to radiated bruits from her vascular disease.   6. Anemia. Recent transfusion. Followed by hematology.  7. CKD with History of acute  on chronic contrast induced nephropathy. Avoid contrast studies in the future unless absolutely necessary.  8. Severe osteoarthritis of right hip. S/p right THR.   I will follow up in 6 months.

## 2016-09-11 ENCOUNTER — Ambulatory Visit: Payer: Medicare Other

## 2016-09-12 ENCOUNTER — Other Ambulatory Visit: Payer: Self-pay

## 2016-09-12 ENCOUNTER — Ambulatory Visit (INDEPENDENT_AMBULATORY_CARE_PROVIDER_SITE_OTHER): Payer: Medicare Other | Admitting: Cardiology

## 2016-09-12 ENCOUNTER — Encounter: Payer: Self-pay | Admitting: Cardiology

## 2016-09-12 VITALS — BP 160/60 | HR 74 | Ht 62.0 in | Wt 214.0 lb

## 2016-09-12 DIAGNOSIS — I2510 Atherosclerotic heart disease of native coronary artery without angina pectoris: Secondary | ICD-10-CM | POA: Diagnosis not present

## 2016-09-12 DIAGNOSIS — N184 Chronic kidney disease, stage 4 (severe): Secondary | ICD-10-CM | POA: Diagnosis not present

## 2016-09-12 DIAGNOSIS — N183 Chronic kidney disease, stage 3 (moderate): Secondary | ICD-10-CM | POA: Diagnosis not present

## 2016-09-12 DIAGNOSIS — Z95828 Presence of other vascular implants and grafts: Secondary | ICD-10-CM

## 2016-09-12 DIAGNOSIS — E1122 Type 2 diabetes mellitus with diabetic chronic kidney disease: Secondary | ICD-10-CM

## 2016-09-12 NOTE — Patient Instructions (Signed)
Continue your current therapy  I will see you in 6 months.   

## 2016-09-26 ENCOUNTER — Ambulatory Visit: Payer: Medicare Other

## 2016-10-18 ENCOUNTER — Ambulatory Visit
Admission: RE | Admit: 2016-10-18 | Discharge: 2016-10-18 | Disposition: A | Discharge status: Home / Self Care | Payer: Medicare Other | Source: Ambulatory Visit | Attending: Family Medicine | Admitting: Family Medicine

## 2016-10-18 DIAGNOSIS — Z1231 Encounter for screening mammogram for malignant neoplasm of breast: Secondary | ICD-10-CM

## 2016-11-26 ENCOUNTER — Telehealth: Payer: Self-pay | Admitting: Internal Medicine

## 2016-11-26 ENCOUNTER — Ambulatory Visit (HOSPITAL_BASED_OUTPATIENT_CLINIC_OR_DEPARTMENT_OTHER): Payer: Medicare Other | Admitting: Internal Medicine

## 2016-11-26 ENCOUNTER — Encounter: Payer: Self-pay | Admitting: Internal Medicine

## 2016-11-26 ENCOUNTER — Other Ambulatory Visit (HOSPITAL_BASED_OUTPATIENT_CLINIC_OR_DEPARTMENT_OTHER): Payer: Medicare Other

## 2016-11-26 VITALS — BP 168/47 | HR 65 | Temp 98.0°F | Resp 18 | Ht 62.0 in | Wt 201.3 lb

## 2016-11-26 DIAGNOSIS — I10 Essential (primary) hypertension: Secondary | ICD-10-CM

## 2016-11-26 DIAGNOSIS — E611 Iron deficiency: Secondary | ICD-10-CM

## 2016-11-26 DIAGNOSIS — D638 Anemia in other chronic diseases classified elsewhere: Secondary | ICD-10-CM

## 2016-11-26 DIAGNOSIS — N289 Disorder of kidney and ureter, unspecified: Secondary | ICD-10-CM

## 2016-11-26 DIAGNOSIS — N184 Chronic kidney disease, stage 4 (severe): Secondary | ICD-10-CM

## 2016-11-26 LAB — CBC WITH DIFFERENTIAL/PLATELET
BASO%: 0.6 % (ref 0.0–2.0)
BASOS ABS: 0 10*3/uL (ref 0.0–0.1)
EOS%: 3.6 % (ref 0.0–7.0)
Eosinophils Absolute: 0.2 10*3/uL (ref 0.0–0.5)
HEMATOCRIT: 28.3 % — AB (ref 34.8–46.6)
HGB: 9.2 g/dL — ABNORMAL LOW (ref 11.6–15.9)
LYMPH#: 0.9 10*3/uL (ref 0.9–3.3)
LYMPH%: 16.4 % (ref 14.0–49.7)
MCH: 28.6 pg (ref 25.1–34.0)
MCHC: 32.4 g/dL (ref 31.5–36.0)
MCV: 88.4 fL (ref 79.5–101.0)
MONO#: 0.6 10*3/uL (ref 0.1–0.9)
MONO%: 9.8 % (ref 0.0–14.0)
NEUT#: 4 10*3/uL (ref 1.5–6.5)
NEUT%: 69.6 % (ref 38.4–76.8)
PLATELETS: 223 10*3/uL (ref 145–400)
RBC: 3.2 10*6/uL — ABNORMAL LOW (ref 3.70–5.45)
RDW: 16.2 % — ABNORMAL HIGH (ref 11.2–14.5)
WBC: 5.8 10*3/uL (ref 3.9–10.3)

## 2016-11-26 LAB — IRON AND TIBC
%SAT: 28 % (ref 21–57)
IRON: 62 ug/dL (ref 41–142)
TIBC: 220 ug/dL — ABNORMAL LOW (ref 236–444)
UIBC: 158 ug/dL (ref 120–384)

## 2016-11-26 LAB — FERRITIN: FERRITIN: 200 ng/mL (ref 9–269)

## 2016-11-26 MED ORDER — CLONIDINE HCL 0.1 MG PO TABS
ORAL_TABLET | ORAL | Status: AC
  Filled 2016-11-26: qty 2

## 2016-11-26 MED ORDER — CLONIDINE HCL 0.1 MG PO TABS: 0.2000 mg | ORAL_TABLET | Freq: Once | ORAL | Status: DC

## 2016-11-26 NOTE — Progress Notes (Signed)
Pt stated she just took cloinidine 0.3 mg -her first of 2 daily doses.

## 2016-11-26 NOTE — Telephone Encounter (Signed)
Gave patient AVS and calender per 5/21 los. Lab and f/u in three months.

## 2016-11-26 NOTE — Progress Notes (Signed)
[facility] [phone]   [phone]  OFFICE PROGRESS NOTE  Bartholome Bill, MD [id number] [place] [address]  DIAGNOSIS: Persistent normocytic anemia of unclear etiology questionable to be anemia of chronic disease plus/minus iron deficiency  PRIOR THERAPY: None  CURRENT THERAPY: Over-the-counter ferrous sulfate.  INTERVAL HISTORY: Norma [name] 71 y.o. female returns to the clinic today for follow-up visit. The patient is feeling much better today with less fatigue compared to 3 months ago. She felt much better after the PRBCs transfusion 3 months ago followed by treatment with over-the-counter ferrous sulfate as well as a change in her diet with more protein. She denied having any chest pain, shortness breath, cough or hemoptysis. She denied having any weight loss or night sweats. She has no nausea or vomiting. She had repeat CBC, iron study and ferritin performed earlier today and she is here for evaluation and discussion of her lab results.  MEDICAL HISTORY: Past Medical History:  Diagnosis Date  . Anxiety   . Carotid artery occlusion   . CKD (chronic kidney disease) stage 3, GFR 30-59 ml/min 04/18/2014  . Claudication ([place])   . Constipation    Due to patient taking iron supplements  . Coronary artery disease   . DDD (degenerative disc disease)   . Diabetes mellitus   . Diabetic coma ([place])   . DJD (degenerative joint disease)   . DJD (degenerative joint disease)   . Edema    Bilateral lower extermities  . Gout   . Hyperlipidemia   . Hypertension   . Iron deficiency anemia   . Leg pain   . Obesity   . PAD (peripheral artery disease) ([place])   . Shortness of breath    exertion    ALLERGIES:  is allergic to iohexol and sulfa antibiotics.  MEDICATIONS:  Current Outpatient Prescriptions  Medication Sig Dispense Refill  . allopurinol (ZYLOPRIM) 100 MG tablet Take 100 mg by mouth daily as needed.    Marland Kitchen aspirin  EC 325 MG EC tablet Take 1 tablet (325 mg total) by mouth daily with breakfast. 30 tablet 0  . atorvastatin (LIPITOR) 80 MG tablet Take 1 tablet (80 mg total) by mouth daily. (Patient taking differently: Take 80 mg by mouth every morning. ) 30 tablet 6  . carvedilol (COREG) 12.5 MG tablet Take 1 tablet (12.5 mg total) by mouth 2 (two) times daily. 180 tablet 3  . cholecalciferol (VITAMIN D) 1000 units tablet Take 1,000 Units by mouth daily as needed (supplement).    . cloNIDine (CATAPRES) 0.3 MG tablet Take 0.3 mg by mouth 2 (two) times daily.     . Coenzyme Q10 200 MG capsule Take 200 mg by mouth every morning.     . ferrous sulfate 325 (65 FE) MG tablet Take 325 mg by mouth daily with breakfast.    . furosemide (LASIX) 80 MG tablet Take 80 mg by mouth daily.     Marland Kitchen glucose blood test strip 1.8 each by Other route as needed for other. Use as instructed    . hydrALAZINE (APRESOLINE) 50 MG tablet Take 50 mg by mouth 3 (three) times daily.    Marland Kitchen HYDROcodone-acetaminophen (NORCO/VICODIN) 5-325 MG tablet Take 1 tablet by mouth every 6 (six) hours as needed for moderate pain. 60 tablet 0  . insulin glargine (LANTUS) 100 UNIT/ML injection Inject 0.2 mLs (20 Units total) into the skin 2 (two) times daily. 10 mL 11  .  isosorbide dinitrate (ISORDIL) 20 MG tablet Take 40 mg by mouth 2 (two) times daily.     Marland Kitchen liraglutide (VICTOZA) 18 MG/3ML SOPN Inject 1.8 mg into the skin every morning.    Marland Kitchen losartan (COZAAR) 100 MG tablet Take 100 mg by mouth daily.    . Magnesium 250 MG TABS Take 250 mg by mouth every morning.     Marland Kitchen OVER THE COUNTER MEDICATION Over the counter laxative from [facility]    . predniSONE (DELTASONE) 10 MG tablet Take 30 mg by mouth daily with breakfast.    . Probiotic Product (PROBIOTIC DAILY PO) Take 1 tablet by mouth daily as needed (supplement).     . vitamin B-12 (CYANOCOBALAMIN) 500 MCG tablet Take 500 mcg by mouth every morning.      Current Facility-Administered Medications  Medication  Dose Route Frequency Provider Last Rate Last Dose  . lidocaine (PF) (XYLOCAINE) 1 % injection 0.3 mL  0.3 mL Other Once Magnus Sinning, MD      . methylPREDNISolone acetate (DEPO-MEDROL) injection 80 mg  80 mg Other Once Magnus Sinning, MD        SURGICAL HISTORY:  Past Surgical History:  Procedure Laterality Date  . AORTA - BILATERAL FEMORAL ARTERY BYPASS GRAFT  05/25/2011   Procedure: AORTA BIFEMORAL BYPASS GRAFT;  Surgeon: Mal Misty, MD;  Location: [facility];  Service: Vascular;  Laterality: N/A;  . APPLICATION OF WOUND VAC Right 04/23/2016   Procedure: APPLICATION OF WOUND VAC CHANGED;  Surgeon: Meredith Pel, MD;  Location: [facility];  Service: Orthopedics;  Laterality: Right;  . BREAST EXCISIONAL BIOPSY Right   . BREAST SURGERY    . CARDIAC CATHETERIZATION  12/01/2009   EF 65%  . CARDIOVASCULAR STRESS TEST  11/28/2009   EF 75%  . COLONOSCOPY W/ POLYPECTOMY    . CORONARY ARTERY BYPASS GRAFT  12/08/2009   LIMA GRAFT TO THE DISTAL LAD AND SAPHENOUS VEIN GRAFT TO THE OBTUSE MARGINAL VESSEL  . EYE SURGERY    . I&D EXTREMITY Right 04/23/2016   Procedure: IRRIGATION AND DEBRIDEMENT EXTREMITY;  Surgeon: Meredith Pel, MD;  Location: [facility];  Service: Orthopedics;  Laterality: Right;  . INCISION AND DRAINAGE HIP Right 04/20/2016   Procedure: IRRIGATION AND DEBRIDEMENT HIP WITH POLY EXCHANGE, ABX BEAD PLACEMENT, WOUND VAC ;  Surgeon: Meredith Pel, MD;  Location: [facility];  Service: Orthopedics;  Laterality: Right;  RIGHT HIP SUPERFICIAL I&D, POSSIBLE DEEP I&D, LINER EXCHANGE, ABX BEAD PLACEMENT, WOUND VAC.   Marland Kitchen PR VEIN BYPASS GRAFT,AORTO-FEM-POP  05/25/11  . REMOVAL OF FIBROUS CYST FROM RIGHT BREAST  10+ YEARS  . RETINAL DETACHMENT SURGERY  10+ YEARS   LEFT EYE  . TOTAL HIP ARTHROPLASTY Right 03/06/2016  . TOTAL HIP ARTHROPLASTY Right 03/06/2016   Procedure: RIGHT TOTAL HIP ARTHROPLASTY ANTERIOR APPROACH;  Surgeon: Meredith Pel, MD;  Location: [facility];  Service: Orthopedics;   Laterality: Right;  . TRANSTHORACIC ECHOCARDIOGRAM  12/01/2009   EF 60-65%    REVIEW OF SYSTEMS:  A comprehensive review of systems was negative except for: Constitutional: positive for fatigue   PHYSICAL EXAMINATION: General appearance: alert, cooperative, fatigued and no distress Head: Normocephalic, without obvious abnormality, atraumatic Neck: no adenopathy, no JVD, supple, symmetrical, trachea midline and thyroid not enlarged, symmetric, no tenderness/mass/nodules Lymph nodes: Cervical, supraclavicular, and axillary nodes normal. Resp: clear to auscultation bilaterally Back: symmetric, no curvature. ROM normal. No CVA tenderness. Cardio: regular rate and rhythm, S1, S2 normal, no murmur, click, rub or gallop  GI: soft, non-tender; bowel sounds normal; no masses,  no organomegaly Extremities: extremities normal, atraumatic, no cyanosis or edema  ECOG PERFORMANCE STATUS: 1 - Symptomatic but completely ambulatory  Blood pressure (!) 185/54, pulse 65, temperature 98 F (36.7 C), temperature source Oral, resp. rate 18, height 5\' 2"  (1.575 m), weight 201 lb 4.8 oz (91.3 kg), SpO2 93 %.  LABORATORY DATA: Lab Results  Component Value Date   WBC 5.8 11/26/2016   HGB 9.2 (L) 11/26/2016   HCT 28.3 (L) 11/26/2016   MCV 88.4 11/26/2016   PLT 223 11/26/2016      Chemistry      Component Value Date/Time   NA 143 08/27/2016 1038   K 5.1 08/27/2016 1038   CL 113 (H) 04/24/2016 0852   CO2 18 (L) 08/27/2016 1038   BUN 55.6 (H) 08/27/2016 1038   CREATININE 1.4 (H) 08/27/2016 1038      Component Value Date/Time   CALCIUM 8.5 08/27/2016 1038   ALKPHOS 145 08/27/2016 1038   AST 37 (H) 08/27/2016 1038   ALT 42 08/27/2016 1038   BILITOT 0.67 08/27/2016 1038       RADIOGRAPHIC STUDIES: No results found.  ASSESSMENT AND PLAN:  This is a very pleasant 71 years old African-American female with anemia of chronic disease secondary to renal insufficiency plus/minus iron deficiency. The  patient is feeling much better today after receiving PRBCs transfusion and starting oral iron tablets. I will see her back for follow-up visit in 3 months for reevaluation with repeat CBC, iron study and ferritin. For hypertension, repeat her blood pressure and still elevated we will consider the patient for treatment with clonidine 0.2 mg by mouth 1. She was advised to call immediately if she has any concerning symptoms in the interval. The patient voices understanding of current disease status and treatment options and is in agreement with the current care plan. All questions were answered. The patient knows to call the clinic with any problems, questions or concerns. We can certainly see the patient much sooner if necessary. I spent 10 minutes counseling the patient face to face. The total time spent in the appointment was 15 minutes.  Disclaimer: This note was dictated with voice recognition software. Similar sounding words can inadvertently be transcribed and may not be corrected upon review.

## 2016-12-25 ENCOUNTER — Other Ambulatory Visit: Payer: Self-pay | Admitting: Family Medicine

## 2016-12-25 DIAGNOSIS — E2839 Other primary ovarian failure: Secondary | ICD-10-CM

## 2017-01-03 ENCOUNTER — Other Ambulatory Visit: Payer: Medicare Other

## 2017-01-17 ENCOUNTER — Ambulatory Visit
Admission: RE | Admit: 2017-01-17 | Discharge: 2017-01-17 | Disposition: A | Discharge status: Home / Self Care | Payer: Medicare Other | Source: Ambulatory Visit | Attending: Family Medicine | Admitting: Family Medicine

## 2017-01-17 DIAGNOSIS — E2839 Other primary ovarian failure: Secondary | ICD-10-CM

## 2017-01-24 ENCOUNTER — Other Ambulatory Visit (HOSPITAL_COMMUNITY): Payer: Self-pay | Admitting: Nurse Practitioner

## 2017-01-24 DIAGNOSIS — B1920 Unspecified viral hepatitis C without hepatic coma: Secondary | ICD-10-CM

## 2017-02-07 ENCOUNTER — Ambulatory Visit (HOSPITAL_COMMUNITY)
Admission: RE | Admit: 2017-02-07 | Discharge: 2017-02-07 | Disposition: A | Discharge status: Home / Self Care | Payer: Medicare Other | Source: Ambulatory Visit | Attending: Nurse Practitioner | Admitting: Nurse Practitioner

## 2017-02-07 DIAGNOSIS — N200 Calculus of kidney: Secondary | ICD-10-CM | POA: Insufficient documentation

## 2017-02-07 DIAGNOSIS — K8020 Calculus of gallbladder without cholecystitis without obstruction: Secondary | ICD-10-CM | POA: Insufficient documentation

## 2017-02-07 DIAGNOSIS — B182 Chronic viral hepatitis C: Secondary | ICD-10-CM | POA: Insufficient documentation

## 2017-02-07 DIAGNOSIS — B1920 Unspecified viral hepatitis C without hepatic coma: Secondary | ICD-10-CM

## 2017-02-26 ENCOUNTER — Telehealth: Payer: Self-pay | Admitting: Internal Medicine

## 2017-02-26 ENCOUNTER — Encounter: Payer: Self-pay | Admitting: Internal Medicine

## 2017-02-26 ENCOUNTER — Ambulatory Visit (HOSPITAL_BASED_OUTPATIENT_CLINIC_OR_DEPARTMENT_OTHER): Payer: Medicare Other | Admitting: Internal Medicine

## 2017-02-26 ENCOUNTER — Other Ambulatory Visit (HOSPITAL_BASED_OUTPATIENT_CLINIC_OR_DEPARTMENT_OTHER): Payer: Medicare Other

## 2017-02-26 VITALS — BP 169/29 | HR 53 | Temp 98.4°F | Resp 18 | Ht 62.0 in | Wt 209.8 lb

## 2017-02-26 DIAGNOSIS — D638 Anemia in other chronic diseases classified elsewhere: Secondary | ICD-10-CM

## 2017-02-26 DIAGNOSIS — D631 Anemia in chronic kidney disease: Secondary | ICD-10-CM

## 2017-02-26 DIAGNOSIS — N183 Chronic kidney disease, stage 3 (moderate): Secondary | ICD-10-CM

## 2017-02-26 DIAGNOSIS — N184 Chronic kidney disease, stage 4 (severe): Secondary | ICD-10-CM

## 2017-02-26 DIAGNOSIS — I10 Essential (primary) hypertension: Secondary | ICD-10-CM

## 2017-02-26 LAB — IRON AND TIBC
%SAT: 17 % — AB (ref 21–57)
IRON: 38 ug/dL — AB (ref 41–142)
TIBC: 218 ug/dL — ABNORMAL LOW (ref 236–444)
UIBC: 180 ug/dL (ref 120–384)

## 2017-02-26 LAB — CBC WITH DIFFERENTIAL/PLATELET
BASO%: 0.5 % (ref 0.0–2.0)
Basophils Absolute: 0 10*3/uL (ref 0.0–0.1)
EOS%: 4 % (ref 0.0–7.0)
Eosinophils Absolute: 0.2 10*3/uL (ref 0.0–0.5)
HCT: 25.9 % — ABNORMAL LOW (ref 34.8–46.6)
HGB: 8.3 g/dL — ABNORMAL LOW (ref 11.6–15.9)
LYMPH%: 12.1 % — AB (ref 14.0–49.7)
MCH: 28.5 pg (ref 25.1–34.0)
MCHC: 32.2 g/dL (ref 31.5–36.0)
MCV: 88.5 fL (ref 79.5–101.0)
MONO#: 0.5 10*3/uL (ref 0.1–0.9)
MONO%: 8.4 % (ref 0.0–14.0)
NEUT%: 75 % (ref 38.4–76.8)
NEUTROS ABS: 4.4 10*3/uL (ref 1.5–6.5)
Platelets: 232 10*3/uL (ref 145–400)
RBC: 2.93 10*6/uL — AB (ref 3.70–5.45)
RDW: 16.4 % — ABNORMAL HIGH (ref 11.2–14.5)
WBC: 5.9 10*3/uL (ref 3.9–10.3)
lymph#: 0.7 10*3/uL — ABNORMAL LOW (ref 0.9–3.3)

## 2017-02-26 LAB — FERRITIN: Ferritin: 184 ng/ml (ref 9–269)

## 2017-02-26 NOTE — Telephone Encounter (Signed)
Scheduled appt per 8/21 los - Gave patient AVS and calender per los.  

## 2017-02-26 NOTE — Progress Notes (Signed)
[place] [phone]   [phone]  OFFICE PROGRESS NOTE  Bartholome Bill, MD [address] [id number]  DIAGNOSIS: Anemia of chronic disease secondary to chronic renal insufficiency.  PRIOR THERAPY: None  CURRENT THERAPY: Aranesp 300 g subcutaneously every 3 weeks. First dose 03/06/2017. Over-the-counter ferrous sulfate.  INTERVAL HISTORY: Norma [name] 71 y.o. female returns to the clinic today for follow-up visit accompanied by a friend. The patient continues to complain of fatigue and weakness. She is currently on oral iron tablets over the counter and tolerating it well. She did not notice any improvement in her condition. She denied having any current chest pain but has shortness of breath with exertion with no cough or hemoptysis. She denied having any weight loss or night sweats. She has no fever or chills. The patient denied having any nausea, vomiting, diarrhea or constipation. She had repeat CBC and iron study performed earlier today and she is here for evaluation and discussion of her lab results and treatment options.  MEDICAL HISTORY: Past Medical History:  Diagnosis Date  . Anxiety   . Carotid artery occlusion   . CKD (chronic kidney disease) stage 3, GFR 30-59 ml/min 04/18/2014  . Claudication ([place])   . Constipation    Due to patient taking iron supplements  . Coronary artery disease   . DDD (degenerative disc disease)   . Diabetes mellitus   . Diabetic coma ([place])   . DJD (degenerative joint disease)   . DJD (degenerative joint disease)   . Edema    Bilateral lower extermities  . Gout   . Hyperlipidemia   . Hypertension   . Iron deficiency anemia   . Leg pain   . Obesity   . PAD (peripheral artery disease) ([place])   . Shortness of breath    exertion    ALLERGIES:  is allergic to iohexol and sulfa antibiotics.  MEDICATIONS:  Current Outpatient Prescriptions  Medication Sig Dispense  Refill  . allopurinol (ZYLOPRIM) 100 MG tablet Take 100 mg by mouth daily as needed.    Marland Kitchen aspirin EC 325 MG EC tablet Take 1 tablet (325 mg total) by mouth daily with breakfast. 30 tablet 0  . atorvastatin (LIPITOR) 80 MG tablet Take 1 tablet (80 mg total) by mouth daily. (Patient taking differently: Take 80 mg by mouth every morning. ) 30 tablet 6  . carvedilol (COREG) 12.5 MG tablet Take 1 tablet (12.5 mg total) by mouth 2 (two) times daily. 180 tablet 3  . cholecalciferol (VITAMIN D) 1000 units tablet Take 1,000 Units by mouth daily as needed (supplement).    . cloNIDine (CATAPRES) 0.3 MG tablet Take 0.3 mg by mouth 2 (two) times daily.     . Coenzyme Q10 200 MG capsule Take 200 mg by mouth every morning.     . ferrous sulfate 325 (65 FE) MG tablet Take 325 mg by mouth daily with breakfast.    . furosemide (LASIX) 80 MG tablet Take 80 mg by mouth daily.     Marland Kitchen glucose blood test strip 1.8 each by Other route as needed for other. Use as instructed    . hydrALAZINE (APRESOLINE) 50 MG tablet Take 50 mg by mouth 3 (three) times daily.    Marland Kitchen HYDROcodone-acetaminophen (NORCO/VICODIN) 5-325 MG tablet Take 1 tablet by mouth every 6 (six) hours as needed for moderate pain. 60 tablet 0  . insulin glargine (LANTUS) 100 UNIT/ML injection Inject 0.2  mLs (20 Units total) into the skin 2 (two) times daily. 10 mL 11  . isosorbide dinitrate (ISORDIL) 20 MG tablet Take 40 mg by mouth 2 (two) times daily.     Marland Kitchen liraglutide (VICTOZA) 18 MG/3ML SOPN Inject 1.8 mg into the skin every morning.    Marland Kitchen losartan (COZAAR) 100 MG tablet Take 100 mg by mouth daily.    . Magnesium 250 MG TABS Take 250 mg by mouth every morning.     Marland Kitchen OVER THE COUNTER MEDICATION Over the counter laxative from [facility]    . predniSONE (DELTASONE) 10 MG tablet Take 30 mg by mouth daily with breakfast.    . Probiotic Product (PROBIOTIC DAILY PO) Take 1 tablet by mouth daily as needed (supplement).     . vitamin B-12 (CYANOCOBALAMIN) 500 MCG tablet  Take 500 mcg by mouth every morning.      Current Facility-Administered Medications  Medication Dose Route Frequency Provider Last Rate Last Dose  . lidocaine (PF) (XYLOCAINE) 1 % injection 0.3 mL  0.3 mL Other Once Magnus Sinning, MD      . methylPREDNISolone acetate (DEPO-MEDROL) injection 80 mg  80 mg Other Once Magnus Sinning, MD        SURGICAL HISTORY:  Past Surgical History:  Procedure Laterality Date  . AORTA - BILATERAL FEMORAL ARTERY BYPASS GRAFT  05/25/2011   Procedure: AORTA BIFEMORAL BYPASS GRAFT;  Surgeon: Mal Misty, MD;  Location: [facility];  Service: Vascular;  Laterality: N/A;  . APPLICATION OF WOUND VAC Right 04/23/2016   Procedure: APPLICATION OF WOUND VAC CHANGED;  Surgeon: Meredith Pel, MD;  Location: [facility];  Service: Orthopedics;  Laterality: Right;  . BREAST EXCISIONAL BIOPSY Right   . BREAST SURGERY    . CARDIAC CATHETERIZATION  12/01/2009   EF 65%  . CARDIOVASCULAR STRESS TEST  11/28/2009   EF 75%  . COLONOSCOPY W/ POLYPECTOMY    . CORONARY ARTERY BYPASS GRAFT  12/08/2009   LIMA GRAFT TO THE DISTAL LAD AND SAPHENOUS VEIN GRAFT TO THE OBTUSE MARGINAL VESSEL  . EYE SURGERY    . I&D EXTREMITY Right 04/23/2016   Procedure: IRRIGATION AND DEBRIDEMENT EXTREMITY;  Surgeon: Meredith Pel, MD;  Location: [facility];  Service: Orthopedics;  Laterality: Right;  . INCISION AND DRAINAGE HIP Right 04/20/2016   Procedure: IRRIGATION AND DEBRIDEMENT HIP WITH POLY EXCHANGE, ABX BEAD PLACEMENT, WOUND VAC ;  Surgeon: Meredith Pel, MD;  Location: [facility];  Service: Orthopedics;  Laterality: Right;  RIGHT HIP SUPERFICIAL I&D, POSSIBLE DEEP I&D, LINER EXCHANGE, ABX BEAD PLACEMENT, WOUND VAC.   Marland Kitchen PR VEIN BYPASS GRAFT,AORTO-FEM-POP  05/25/11  . REMOVAL OF FIBROUS CYST FROM RIGHT BREAST  10+ YEARS  . RETINAL DETACHMENT SURGERY  10+ YEARS   LEFT EYE  . TOTAL HIP ARTHROPLASTY Right 03/06/2016  . TOTAL HIP ARTHROPLASTY Right 03/06/2016   Procedure: RIGHT TOTAL HIP  ARTHROPLASTY ANTERIOR APPROACH;  Surgeon: Meredith Pel, MD;  Location: [facility];  Service: Orthopedics;  Laterality: Right;  . TRANSTHORACIC ECHOCARDIOGRAM  12/01/2009   EF 60-65%    REVIEW OF SYSTEMS:  Constitutional: positive for fatigue Eyes: negative Ears, nose, mouth, throat, and face: negative Respiratory: positive for dyspnea on exertion Cardiovascular: negative Gastrointestinal: negative Genitourinary:negative Integument/breast: negative Hematologic/lymphatic: negative Musculoskeletal:negative Neurological: negative Behavioral/Psych: negative Endocrine: negative Allergic/Immunologic: negative   PHYSICAL EXAMINATION: General appearance: alert, cooperative, fatigued and no distress Head: Normocephalic, without obvious abnormality, atraumatic Neck: no adenopathy, no JVD, supple, symmetrical, trachea midline and thyroid not  enlarged, symmetric, no tenderness/mass/nodules Lymph nodes: Cervical, supraclavicular, and axillary nodes normal. Resp: clear to auscultation bilaterally Back: symmetric, no curvature. ROM normal. No CVA tenderness. Cardio: regular rate and rhythm, S1, S2 normal, no murmur, click, rub or gallop GI: soft, non-tender; bowel sounds normal; no masses,  no organomegaly Extremities: extremities normal, atraumatic, no cyanosis or edema Neurologic: Alert and oriented X 3, normal strength and tone. Normal symmetric reflexes. Normal coordination and gait  ECOG PERFORMANCE STATUS: 1 - Symptomatic but completely ambulatory  Blood pressure (!) 169/29, pulse (!) 53, temperature 98.4 F (36.9 C), temperature source Oral, resp. rate 18, height 5\' 2"  (1.575 m), weight 209 lb 12.8 oz (95.2 kg), SpO2 95 %.  LABORATORY DATA: Lab Results  Component Value Date   WBC 5.9 02/26/2017   HGB 8.3 (L) 02/26/2017   HCT 25.9 (L) 02/26/2017   MCV 88.5 02/26/2017   PLT 232 02/26/2017      Chemistry      Component Value Date/Time   NA 143 08/27/2016 1038   K 5.1 08/27/2016  1038   CL 113 (H) 04/24/2016 0852   CO2 18 (L) 08/27/2016 1038   BUN 55.6 (H) 08/27/2016 1038   CREATININE 1.4 (H) 08/27/2016 1038      Component Value Date/Time   CALCIUM 8.5 08/27/2016 1038   ALKPHOS 145 08/27/2016 1038   AST 37 (H) 08/27/2016 1038   ALT 42 08/27/2016 1038   BILITOT 0.67 08/27/2016 1038       RADIOGRAPHIC STUDIES: US Abdomen Complete W/elastography  Result Date: 02/07/2017 CLINICAL DATA:  Hepatitis-C EXAM: ULTRASOUND ABDOMEN ULTRASOUND HEPATIC ELASTOGRAPHY TECHNIQUE: Sonography of the upper abdomen was performed. In addition, ultrasound elastography evaluation of the liver was performed. A region of interest was placed within the right lobe of the liver. Following application of a compressive sonographic pulse, shear waves were detected in the adjacent hepatic tissue and the shear wave velocity was calculated. Multiple assessments were performed at the selected site. Median shear wave velocity is correlated to a Metavir fibrosis score. COMPARISON:  CT 05/01/2011 FINDINGS: ULTRASOUND ABDOMEN Gallbladder: Multiple gallstones, the largest 1 cm. No wall thickening. Negative sonographic Murphy's. Common bile duct: Diameter: Normal caliber, 4 mm Liver: No focal lesion identified. Within normal limits in parenchymal echogenicity. IVC: Visualized portions unremarkable. Pancreas: Not well visualized due to overlying bowel gas Spleen: Size and appearance within normal limits. Right Kidney: Length: 10 cm. 6 mm nonobstructing stone in the midpole. Echogenicity within normal limits. No mass or hydronephrosis visualized. Left Kidney: Length: 11.6 cm. 5 mm nonobstructing stone in the midpole. Echogenicity within normal limits. No mass or hydronephrosis. Abdominal aorta: No aneurysm visualized. Other findings: None. ULTRASOUND HEPATIC ELASTOGRAPHY Device: Siemens Helix VTQ Patient position: Left lateral decubitus Transducer 6C1 Number of measurements: 10 Hepatic segment:  8 Median velocity:    3.92  m/sec IQR: 0.53 IQR/Median velocity ratio: 0.14 Corresponding Metavir fibrosis score:  Some F3 + F4 Risk of fibrosis: High Limitations of exam: None Pertinent findings noted on other imaging exams:  None Please note that abnormal shear wave velocities may also be identified in clinical settings other than with hepatic fibrosis, such as: acute hepatitis, elevated right heart and central venous pressures including use of beta blockers, veno-occlusive disease (Budd-Chiari), infiltrative processes such as mastocytosis/amyloidosis/infiltrative tumor, extrahepatic cholestasis, in the post-prandial state, and liver transplantation. Correlation with patient history, laboratory data, and clinical condition recommended. IMPRESSION: ULTRASOUND ABDOMEN: Bilateral nonobstructing nephrolithiasis. Cholelithiasis. ULTRASOUND HEPATIC ELASTOGRAPHY: Median hepatic shear wave velocity is calculated at  3.92 m/sec. Corresponding Metavir fibrosis score is  Some F3 + F4. Risk of fibrosis is High. Follow-up: Follow up advised Electronically Signed   By: Rolm Baptise M.D.   On: 02/07/2017 14:44    ASSESSMENT AND PLAN:  This is a very pleasant 71 years old African-American female with anemia of chronic disease secondary to renal insufficiency plus/minus iron deficiency. The patient has been complaining of increasing fatigue and weakness as well as shortness of breath recently. I had a lengthy discussion with the patient about her current condition and treatment options. I recommended for the patient to continue her current treatment with oral iron tablet. I also discussed with her treatment with Aranesp 300 g subcutaneously every 3 weeks for the anemia of chronic disease secondary to her renal insufficiency. The patient is interested in this option and she is expected to start the first dose of this treatment next week. I will see her back for follow-up visit in 3 months for reevaluation with repeat CBC, iron study and  ferritin. For the hypertension, I strongly encouraged the patient to take her blood pressure medication as prescribed and to monitor it closely at home. She was advised to call immediately if she has any concerning symptoms in the interval. The patient voices understanding of current disease status and treatment options and is in agreement with the current care plan. All questions were answered. The patient knows to call the clinic with any problems, questions or concerns. We can certainly see the patient much sooner if necessary.  Disclaimer: This note was dictated with voice recognition software. Similar sounding words can inadvertently be transcribed and may not be corrected upon review.

## 2017-03-05 ENCOUNTER — Other Ambulatory Visit: Payer: Self-pay | Admitting: Medical Oncology

## 2017-03-05 DIAGNOSIS — D638 Anemia in other chronic diseases classified elsewhere: Secondary | ICD-10-CM

## 2017-03-06 ENCOUNTER — Ambulatory Visit (HOSPITAL_BASED_OUTPATIENT_CLINIC_OR_DEPARTMENT_OTHER): Payer: Medicare Other

## 2017-03-06 ENCOUNTER — Other Ambulatory Visit (HOSPITAL_BASED_OUTPATIENT_CLINIC_OR_DEPARTMENT_OTHER): Payer: Medicare Other

## 2017-03-06 VITALS — BP 156/55 | HR 54 | Temp 98.0°F | Resp 20

## 2017-03-06 DIAGNOSIS — N183 Chronic kidney disease, stage 3 (moderate): Secondary | ICD-10-CM | POA: Diagnosis not present

## 2017-03-06 DIAGNOSIS — D631 Anemia in chronic kidney disease: Secondary | ICD-10-CM

## 2017-03-06 DIAGNOSIS — D638 Anemia in other chronic diseases classified elsewhere: Secondary | ICD-10-CM

## 2017-03-06 DIAGNOSIS — N184 Chronic kidney disease, stage 4 (severe): Secondary | ICD-10-CM

## 2017-03-06 LAB — CBC WITH DIFFERENTIAL/PLATELET
BASO%: 0.5 % (ref 0.0–2.0)
Basophils Absolute: 0 10*3/uL (ref 0.0–0.1)
EOS%: 3.9 % (ref 0.0–7.0)
Eosinophils Absolute: 0.2 10*3/uL (ref 0.0–0.5)
HEMATOCRIT: 25.6 % — AB (ref 34.8–46.6)
HEMOGLOBIN: 8.2 g/dL — AB (ref 11.6–15.9)
LYMPH#: 0.5 10*3/uL — AB (ref 0.9–3.3)
LYMPH%: 9.5 % — ABNORMAL LOW (ref 14.0–49.7)
MCH: 28.5 pg (ref 25.1–34.0)
MCHC: 32 g/dL (ref 31.5–36.0)
MCV: 89.1 fL (ref 79.5–101.0)
MONO#: 0.5 10*3/uL (ref 0.1–0.9)
MONO%: 7.9 % (ref 0.0–14.0)
NEUT#: 4.5 10*3/uL (ref 1.5–6.5)
NEUT%: 78.2 % — ABNORMAL HIGH (ref 38.4–76.8)
Platelets: 225 10*3/uL (ref 145–400)
RBC: 2.87 10*6/uL — AB (ref 3.70–5.45)
RDW: 16.2 % — AB (ref 11.2–14.5)
WBC: 5.8 10*3/uL (ref 3.9–10.3)

## 2017-03-06 MED ORDER — DARBEPOETIN ALFA 300 MCG/0.6ML IJ SOSY
300.0000 ug | PREFILLED_SYRINGE | Freq: Once | INTRAMUSCULAR | Status: AC
  Administered 2017-03-06: 300 ug via SUBCUTANEOUS
  Filled 2017-03-06: qty 0.6

## 2017-03-06 NOTE — Patient Instructions (Addendum)
Darbepoetin Alfa injection What is this medicine? DARBEPOETIN ALFA (dar be POE e tin AL fa) helps your body make more red blood cells. It is used to treat anemia caused by chronic kidney failure and chemotherapy. This medicine may be used for other purposes; ask your health care provider or pharmacist if you have questions. COMMON BRAND NAME(S): Aranesp What should I tell my health care provider before I take this medicine? They need to know if you have any of these conditions: -blood clotting disorders or history of blood clots -cancer patient not on chemotherapy -cystic fibrosis -heart disease, such as angina, heart failure, or a history of a heart attack -hemoglobin level of 12 g/dL or greater -high blood pressure -low levels of folate, iron, or vitamin B12 -seizures -an unusual or allergic reaction to darbepoetin, erythropoietin, albumin, hamster proteins, latex, other medicines, foods, dyes, or preservatives -pregnant or trying to get pregnant -breast-feeding How should I use this medicine? This medicine is for injection into a vein or under the skin. It is usually given by a health care professional in a hospital or clinic setting. If you get this medicine at home, you will be taught how to prepare and give this medicine. Use exactly as directed. Take your medicine at regular intervals. Do not take your medicine more often than directed. It is important that you put your used needles and syringes in a special sharps container. Do not put them in a trash can. If you do not have a sharps container, call your pharmacist or healthcare provider to get one. A special MedGuide will be given to you by the pharmacist with each prescription and refill. Be sure to read this information carefully each time. Talk to your pediatrician regarding the use of this medicine in children. While this medicine may be used in children as young as 1 year for selected conditions, precautions do  apply. Overdosage: If you think you have taken too much of this medicine contact a poison control center or emergency room at once. NOTE: This medicine is only for you. Do not share this medicine with others. What if I miss a dose? If you miss a dose, take it as soon as you can. If it is almost time for your next dose, take only that dose. Do not take double or extra doses. What may interact with this medicine? Do not take this medicine with any of the following medications: -epoetin alfa This list may not describe all possible interactions. Give your health care provider a list of all the medicines, herbs, non-prescription drugs, or dietary supplements you use. Also tell them if you smoke, drink alcohol, or use illegal drugs. Some items may interact with your medicine. What should I watch for while using this medicine? Your condition will be monitored carefully while you are receiving this medicine. You may need blood work done while you are taking this medicine. What side effects may I notice from receiving this medicine? Side effects that you should report to your doctor or health care professional as soon as possible: -allergic reactions like skin rash, itching or hives, swelling of the face, lips, or tongue -breathing problems -changes in vision -chest pain -confusion, trouble speaking or understanding -feeling faint or lightheaded, falls -high blood pressure -muscle aches or pains -pain, swelling, warmth in the leg -rapid weight gain -severe headaches -sudden numbness or weakness of the face, arm or leg -trouble walking, dizziness, loss of balance or coordination -seizures (convulsions) -swelling of the ankles, feet, hands -  unusually weak or tired Side effects that usually do not require medical attention (report to your doctor or health care professional if they continue or are bothersome): -diarrhea -fever, chills (flu-like symptoms) -headaches -nausea, vomiting -redness,  stinging, or swelling at site where injected This list may not describe all possible side effects. Call your doctor for medical advice about side effects. You may report side effects to FDA at 1-800-FDA-1088. Where should I keep my medicine? Keep out of the reach of children. Store in a refrigerator between 2 and 8 degrees C (36 and 46 degrees F). Do not freeze. Do not shake. Throw away any unused portion if using a single-dose vial. Throw away any unused medicine after the expiration date. NOTE: This sheet is a summary. It may not cover all possible information. If you have questions about this medicine, talk to your doctor, pharmacist, or health care provider.  2018 Elsevier/Gold Standard (2016-02-13 19:52:26)  Iron-Rich Diet Iron is a mineral that helps your body to produce hemoglobin. Hemoglobin is a protein in your red blood cells that carries oxygen to your body's tissues. Eating too little iron may cause you to feel weak and tired, and it can increase your risk for infection. Eating enough iron is necessary for your body's metabolism, muscle function, and nervous system. Iron is naturally found in many foods. It can also be added to foods or fortified in foods. There are two types of dietary iron:  Heme iron. Heme iron is absorbed by the body more easily than nonheme iron. Heme iron is found in meat, poultry, and fish.  Nonheme iron. Nonheme iron is found in dietary supplements, iron-fortified grains, beans, and vegetables.  You may need to follow an iron-rich diet if:  You have been diagnosed with iron deficiency or iron-deficiency anemia.  You have a condition that prevents you from absorbing dietary iron, such as: ? Infection in your intestines. ? Celiac disease. This involves long-lasting (chronic) inflammation of your intestines.  You do not eat enough iron.  You eat a diet that is high in foods that impair iron absorption.  You have lost a lot of blood.  You have heavy  bleeding during your menstrual cycle.  You are pregnant.  What is my plan? Your health care provider may help you to determine how much iron you need per day based on your condition. Generally, when a person consumes sufficient amounts of iron in the diet, the following iron needs are met:  Men. ? 14-18 years old: 11 mg per day. ? 19-50 years old: 8 mg per day.  Women. ? 14-18 years old: 15 mg per day. ? 19-50 years old: 18 mg per day. ? Over 50 years old: 8 mg per day. ? Pregnant women: 27 mg per day. ? Breastfeeding women: 9 mg per day.  What do I need to know about an iron-rich diet?  Eat fresh fruits and vegetables that are high in vitamin C along with foods that are high in iron. This will help increase the amount of iron that your body absorbs from food, especially with foods containing nonheme iron. Foods that are high in vitamin C include oranges, peppers, tomatoes, and mango.  Take iron supplements only as directed by your health care provider. Overdose of iron can be life-threatening. If you were prescribed iron supplements, take them with orange juice or a vitamin C supplement.  Cook foods in pots and pans that are made from iron.  Eat nonheme iron-containing foods alongside foods   that are high in heme iron. This helps to improve your iron absorption.  Certain foods and drinks contain compounds that impair iron absorption. Avoid eating these foods in the same meal as iron-rich foods or with iron supplements. These include: ? Coffee, black tea, and red wine. ? Milk, dairy products, and foods that are high in calcium. ? Beans, soybeans, and peas. ? Whole grains.  When eating foods that contain both nonheme iron and compounds that impair iron absorption, follow these tips to absorb iron better. ? Soak beans overnight before cooking. ? Soak whole grains overnight and drain them before using. ? Ferment flours before baking, such as using yeast in bread dough. What foods  can I eat? Grains Iron-fortified breakfast cereal. Iron-fortified whole-wheat bread. Enriched rice. Sprouted grains. Vegetables Spinach. Potatoes with skin. Green peas. Broccoli. Red and green bell peppers. Fermented vegetables. Fruits Prunes. Raisins. Oranges. Strawberries. Mango. Grapefruit. Meats and Other Protein Sources Beef liver. Oysters. Beef. Shrimp. [place]. Chicken. [place]. Sardines. Chickpeas. Nuts. Tofu. Beverages Tomato juice. Fresh orange juice. Prune juice. Hibiscus tea. Fortified instant breakfast shakes. Condiments Tahini. Fermented soy sauce. Sweets and Desserts Black-strap molasses. Other Wheat germ. The items listed above may not be a complete list of recommended foods or beverages. Contact your dietitian for more options. What foods are not recommended? Grains Whole grains. Bran cereal. Bran flour. Oats. Vegetables Artichokes. Brussels sprouts. Kale. Fruits Blueberries. Raspberries. Strawberries. Figs. Meats and Other Protein Sources Soybeans. Products made from soy protein. Dairy Milk. Cream. Cheese. Yogurt. Cottage cheese. Beverages Coffee. Black tea. Red wine. Sweets and Desserts Cocoa. Chocolate. Ice cream. Other Basil. Oregano. Parsley. The items listed above may not be a complete list of foods and beverages to avoid. Contact your dietitian for more information. This information is not intended to replace advice given to you by your health care provider. Make sure you discuss any questions you have with your health care provider. Document Released: 02/06/2005 Document Revised: 01/13/2016 Document Reviewed: 01/20/2014 Elsevier Interactive Patient Education  [employer].

## 2017-03-06 NOTE — Progress Notes (Signed)
List of Iron Rich foods given to patient at time of D/C.

## 2017-03-25 ENCOUNTER — Telehealth (INDEPENDENT_AMBULATORY_CARE_PROVIDER_SITE_OTHER): Payer: Self-pay | Admitting: Physical Medicine and Rehabilitation

## 2017-03-25 NOTE — Telephone Encounter (Signed)
Scheduled for 04/22/17 at 1030 with driver.

## 2017-03-25 NOTE — Telephone Encounter (Signed)
Yes okay 

## 2017-03-26 ENCOUNTER — Other Ambulatory Visit: Payer: Self-pay | Admitting: Medical Oncology

## 2017-03-26 DIAGNOSIS — D638 Anemia in other chronic diseases classified elsewhere: Secondary | ICD-10-CM

## 2017-03-27 ENCOUNTER — Ambulatory Visit: Payer: Medicare Other

## 2017-03-27 ENCOUNTER — Other Ambulatory Visit (HOSPITAL_BASED_OUTPATIENT_CLINIC_OR_DEPARTMENT_OTHER): Payer: Medicare Other

## 2017-03-27 VITALS — BP 181/40 | HR 53 | Temp 97.2°F | Resp 20

## 2017-03-27 DIAGNOSIS — D638 Anemia in other chronic diseases classified elsewhere: Secondary | ICD-10-CM

## 2017-03-27 DIAGNOSIS — D631 Anemia in chronic kidney disease: Secondary | ICD-10-CM | POA: Diagnosis not present

## 2017-03-27 DIAGNOSIS — N183 Chronic kidney disease, stage 3 (moderate): Secondary | ICD-10-CM | POA: Diagnosis not present

## 2017-03-27 DIAGNOSIS — N184 Chronic kidney disease, stage 4 (severe): Secondary | ICD-10-CM

## 2017-03-27 LAB — CBC WITH DIFFERENTIAL/PLATELET
BASO%: 0 % (ref 0.0–2.0)
BASOS ABS: 0 10*3/uL (ref 0.0–0.1)
EOS%: 3.2 % (ref 0.0–7.0)
Eosinophils Absolute: 0.2 10*3/uL (ref 0.0–0.5)
HCT: 28.5 % — ABNORMAL LOW (ref 34.8–46.6)
HGB: 8.6 g/dL — ABNORMAL LOW (ref 11.6–15.9)
LYMPH%: 12.5 % — AB (ref 14.0–49.7)
MCH: 27.3 pg (ref 25.1–34.0)
MCHC: 30.2 g/dL — AB (ref 31.5–36.0)
MCV: 90.5 fL (ref 79.5–101.0)
MONO#: 0.4 10*3/uL (ref 0.1–0.9)
MONO%: 7.6 % (ref 0.0–14.0)
NEUT#: 4.4 10*3/uL (ref 1.5–6.5)
NEUT%: 76.7 % (ref 38.4–76.8)
PLATELETS: 206 10*3/uL (ref 145–400)
RBC: 3.15 10*6/uL — AB (ref 3.70–5.45)
RDW: 16.3 % — ABNORMAL HIGH (ref 11.2–14.5)
WBC: 5.7 10*3/uL (ref 3.9–10.3)
lymph#: 0.7 10*3/uL — ABNORMAL LOW (ref 0.9–3.3)

## 2017-03-27 LAB — COMPREHENSIVE METABOLIC PANEL
ALT: 10 U/L (ref 0–55)
ANION GAP: 9 meq/L (ref 3–11)
AST: 15 U/L (ref 5–34)
Albumin: 2.2 g/dL — ABNORMAL LOW (ref 3.5–5.0)
Alkaline Phosphatase: 95 U/L (ref 40–150)
BUN: 60.4 mg/dL — ABNORMAL HIGH (ref 7.0–26.0)
CALCIUM: 8.3 mg/dL — AB (ref 8.4–10.4)
CHLORIDE: 112 meq/L — AB (ref 98–109)
CO2: 21 meq/L — AB (ref 22–29)
CREATININE: 2.4 mg/dL — AB (ref 0.6–1.1)
EGFR: 22 mL/min/{1.73_m2} — AB (ref >90)
Glucose: 291 mg/dl — ABNORMAL HIGH (ref 70–140)
POTASSIUM: 5 meq/L (ref 3.5–5.1)
Sodium: 142 mEq/L (ref 136–145)
Total Bilirubin: 0.51 mg/dL (ref 0.20–1.20)
Total Protein: 5.2 g/dL — ABNORMAL LOW (ref 6.4–8.3)

## 2017-03-27 MED ORDER — CLONIDINE HCL 0.1 MG PO TABS
ORAL_TABLET | ORAL | Status: AC
  Filled 2017-03-27: qty 2

## 2017-03-27 MED ORDER — DARBEPOETIN ALFA 300 MCG/0.6ML IJ SOSY: 300.0000 ug | PREFILLED_SYRINGE | Freq: Once | INTRAMUSCULAR | Status: DC

## 2017-03-27 MED ORDER — CLONIDINE HCL 0.1 MG PO TABS
0.2000 mg | ORAL_TABLET | Freq: Once | ORAL | Status: AC
  Administered 2017-03-27: 0.2 mg via ORAL

## 2017-03-27 NOTE — Progress Notes (Signed)
Aranesp held due to elevated BP. Clonidine 0.2 mg given per Dr Julien Nordmann. Pt has driver to take her home.

## 2017-04-16 ENCOUNTER — Other Ambulatory Visit: Payer: Self-pay | Admitting: Medical Oncology

## 2017-04-16 DIAGNOSIS — D72829 Elevated white blood cell count, unspecified: Secondary | ICD-10-CM

## 2017-04-17 ENCOUNTER — Other Ambulatory Visit (HOSPITAL_BASED_OUTPATIENT_CLINIC_OR_DEPARTMENT_OTHER): Payer: Medicare Other

## 2017-04-17 ENCOUNTER — Ambulatory Visit (HOSPITAL_BASED_OUTPATIENT_CLINIC_OR_DEPARTMENT_OTHER): Payer: Medicare Other

## 2017-04-17 VITALS — BP 157/45 | HR 65 | Temp 97.8°F | Resp 18

## 2017-04-17 DIAGNOSIS — D631 Anemia in chronic kidney disease: Secondary | ICD-10-CM

## 2017-04-17 DIAGNOSIS — N183 Chronic kidney disease, stage 3 (moderate): Secondary | ICD-10-CM

## 2017-04-17 DIAGNOSIS — N184 Chronic kidney disease, stage 4 (severe): Secondary | ICD-10-CM

## 2017-04-17 DIAGNOSIS — D72829 Elevated white blood cell count, unspecified: Secondary | ICD-10-CM

## 2017-04-17 DIAGNOSIS — D638 Anemia in other chronic diseases classified elsewhere: Secondary | ICD-10-CM

## 2017-04-17 LAB — CBC WITH DIFFERENTIAL/PLATELET
BASO%: 0.1 % (ref 0.0–2.0)
Basophils Absolute: 0 10e3/uL (ref 0.0–0.1)
EOS%: 1 % (ref 0.0–7.0)
Eosinophils Absolute: 0.1 10e3/uL (ref 0.0–0.5)
HCT: 28 % — ABNORMAL LOW (ref 34.8–46.6)
HGB: 8.6 g/dL — ABNORMAL LOW (ref 11.6–15.9)
LYMPH%: 8.4 % — ABNORMAL LOW (ref 14.0–49.7)
MCH: 27.4 pg (ref 25.1–34.0)
MCHC: 30.7 g/dL — ABNORMAL LOW (ref 31.5–36.0)
MCV: 89.2 fL (ref 79.5–101.0)
MONO#: 0.9 10e3/uL (ref 0.1–0.9)
MONO%: 10.6 % (ref 0.0–14.0)
NEUT#: 6.9 10e3/uL — ABNORMAL HIGH (ref 1.5–6.5)
NEUT%: 79.9 % — ABNORMAL HIGH (ref 38.4–76.8)
Platelets: 239 10e3/uL (ref 145–400)
RBC: 3.14 10e6/uL — ABNORMAL LOW (ref 3.70–5.45)
RDW: 15.9 % — ABNORMAL HIGH (ref 11.2–14.5)
WBC: 8.6 10e3/uL (ref 3.9–10.3)
lymph#: 0.7 10e3/uL — ABNORMAL LOW (ref 0.9–3.3)

## 2017-04-17 LAB — COMPREHENSIVE METABOLIC PANEL
ALBUMIN: 2.1 g/dL — AB (ref 3.5–5.0)
ALT: 10 U/L (ref 0–55)
AST: 12 U/L (ref 5–34)
Alkaline Phosphatase: 99 U/L (ref 40–150)
Anion Gap: 8 mEq/L (ref 3–11)
BUN: 48.4 mg/dL — AB (ref 7.0–26.0)
CALCIUM: 8.4 mg/dL (ref 8.4–10.4)
CO2: 25 mEq/L (ref 22–29)
CREATININE: 2.3 mg/dL — AB (ref 0.6–1.1)
Chloride: 112 mEq/L — ABNORMAL HIGH (ref 98–109)
EGFR: 24 mL/min/{1.73_m2} — ABNORMAL LOW (ref >60)
Glucose: 219 mg/dl — ABNORMAL HIGH (ref 70–140)
Potassium: 4.5 mEq/L (ref 3.5–5.1)
Sodium: 145 mEq/L (ref 136–145)
Total Bilirubin: 0.52 mg/dL (ref 0.20–1.20)
Total Protein: 5.7 g/dL — ABNORMAL LOW (ref 6.4–8.3)

## 2017-04-17 MED ORDER — DARBEPOETIN ALFA 300 MCG/0.6ML IJ SOSY
300.0000 ug | PREFILLED_SYRINGE | Freq: Once | INTRAMUSCULAR | Status: AC
  Administered 2017-04-17: 300 ug via SUBCUTANEOUS
  Filled 2017-04-17: qty 0.6

## 2017-04-17 NOTE — Patient Instructions (Signed)
Darbepoetin Alfa injection What is this medicine? DARBEPOETIN ALFA (dar be POE e tin AL fa) helps your body make more red blood cells. It is used to treat anemia caused by chronic kidney failure and chemotherapy. This medicine may be used for other purposes; ask your health care provider or pharmacist if you have questions. COMMON BRAND NAME(S): Aranesp What should I tell my health care provider before I take this medicine? They need to know if you have any of these conditions: -blood clotting disorders or history of blood clots -cancer patient not on chemotherapy -cystic fibrosis -heart disease, such as angina, heart failure, or a history of a heart attack -hemoglobin level of 12 g/dL or greater -high blood pressure -low levels of folate, iron, or vitamin B12 -seizures -an unusual or allergic reaction to darbepoetin, erythropoietin, albumin, hamster proteins, latex, other medicines, foods, dyes, or preservatives -pregnant or trying to get pregnant -breast-feeding How should I use this medicine? This medicine is for injection into a vein or under the skin. It is usually given by a health care professional in a hospital or clinic setting. If you get this medicine at home, you will be taught how to prepare and give this medicine. Use exactly as directed. Take your medicine at regular intervals. Do not take your medicine more often than directed. It is important that you put your used needles and syringes in a special sharps container. Do not put them in a trash can. If you do not have a sharps container, call your pharmacist or healthcare provider to get one. A special MedGuide will be given to you by the pharmacist with each prescription and refill. Be sure to read this information carefully each time. Talk to your pediatrician regarding the use of this medicine in children. While this medicine may be used in children as young as 1 year for selected conditions, precautions do  apply. Overdosage: If you think you have taken too much of this medicine contact a poison control center or emergency room at once. NOTE: This medicine is only for you. Do not share this medicine with others. What if I miss a dose? If you miss a dose, take it as soon as you can. If it is almost time for your next dose, take only that dose. Do not take double or extra doses. What may interact with this medicine? Do not take this medicine with any of the following medications: -epoetin alfa This list may not describe all possible interactions. Give your health care provider a list of all the medicines, herbs, non-prescription drugs, or dietary supplements you use. Also tell them if you smoke, drink alcohol, or use illegal drugs. Some items may interact with your medicine. What should I watch for while using this medicine? Your condition will be monitored carefully while you are receiving this medicine. You may need blood work done while you are taking this medicine. What side effects may I notice from receiving this medicine? Side effects that you should report to your doctor or health care professional as soon as possible: -allergic reactions like skin rash, itching or hives, swelling of the face, lips, or tongue -breathing problems -changes in vision -chest pain -confusion, trouble speaking or understanding -feeling faint or lightheaded, falls -high blood pressure -muscle aches or pains -pain, swelling, warmth in the leg -rapid weight gain -severe headaches -sudden numbness or weakness of the face, arm or leg -trouble walking, dizziness, loss of balance or coordination -seizures (convulsions) -swelling of the ankles, feet, hands -  unusually weak or tired Side effects that usually do not require medical attention (report to your doctor or health care professional if they continue or are bothersome): -diarrhea -fever, chills (flu-like symptoms) -headaches -nausea, vomiting -redness,  stinging, or swelling at site where injected This list may not describe all possible side effects. Call your doctor for medical advice about side effects. You may report side effects to FDA at 1-800-FDA-1088. Where should I keep my medicine? Keep out of the reach of children. Store in a refrigerator between 2 and 8 degrees C (36 and 46 degrees F). Do not freeze. Do not shake. Throw away any unused portion if using a single-dose vial. Throw away any unused medicine after the expiration date. NOTE: This sheet is a summary. It may not cover all possible information. If you have questions about this medicine, talk to your doctor, pharmacist, or health care provider.  2018 Elsevier/Gold Standard (2016-02-13 19:52:26)  Iron-Rich Diet Iron is a mineral that helps your body to produce hemoglobin. Hemoglobin is a protein in your red blood cells that carries oxygen to your body's tissues. Eating too little iron may cause you to feel weak and tired, and it can increase your risk for infection. Eating enough iron is necessary for your body's metabolism, muscle function, and nervous system. Iron is naturally found in many foods. It can also be added to foods or fortified in foods. There are two types of dietary iron:  Heme iron. Heme iron is absorbed by the body more easily than nonheme iron. Heme iron is found in meat, poultry, and fish.  Nonheme iron. Nonheme iron is found in dietary supplements, iron-fortified grains, beans, and vegetables.  You may need to follow an iron-rich diet if:  You have been diagnosed with iron deficiency or iron-deficiency anemia.  You have a condition that prevents you from absorbing dietary iron, such as: ? Infection in your intestines. ? Celiac disease. This involves long-lasting (chronic) inflammation of your intestines.  You do not eat enough iron.  You eat a diet that is high in foods that impair iron absorption.  You have lost a lot of blood.  You have heavy  bleeding during your menstrual cycle.  You are pregnant.  What is my plan? Your health care provider may help you to determine how much iron you need per day based on your condition. Generally, when a person consumes sufficient amounts of iron in the diet, the following iron needs are met:  Men. ? 14-18 years old: 11 mg per day. ? 19-50 years old: 8 mg per day.  Women. ? 14-18 years old: 15 mg per day. ? 19-50 years old: 18 mg per day. ? Over 50 years old: 8 mg per day. ? Pregnant women: 27 mg per day. ? Breastfeeding women: 9 mg per day.  What do I need to know about an iron-rich diet?  Eat fresh fruits and vegetables that are high in vitamin C along with foods that are high in iron. This will help increase the amount of iron that your body absorbs from food, especially with foods containing nonheme iron. Foods that are high in vitamin C include oranges, peppers, tomatoes, and mango.  Take iron supplements only as directed by your health care provider. Overdose of iron can be life-threatening. If you were prescribed iron supplements, take them with orange juice or a vitamin C supplement.  Cook foods in pots and pans that are made from iron.  Eat nonheme iron-containing foods alongside foods   that are high in heme iron. This helps to improve your iron absorption.  Certain foods and drinks contain compounds that impair iron absorption. Avoid eating these foods in the same meal as iron-rich foods or with iron supplements. These include: ? Coffee, black tea, and red wine. ? Milk, dairy products, and foods that are high in calcium. ? Beans, soybeans, and peas. ? Whole grains.  When eating foods that contain both nonheme iron and compounds that impair iron absorption, follow these tips to absorb iron better. ? Soak beans overnight before cooking. ? Soak whole grains overnight and drain them before using. ? Ferment flours before baking, such as using yeast in bread dough. What foods  can I eat? Grains Iron-fortified breakfast cereal. Iron-fortified whole-wheat bread. Enriched rice. Sprouted grains. Vegetables Spinach. Potatoes with skin. Green peas. Broccoli. Red and green bell peppers. Fermented vegetables. Fruits Prunes. Raisins. Oranges. Strawberries. Mango. Grapefruit. Meats and Other Protein Sources Beef liver. Oysters. Beef. Shrimp. [place]. Chicken. [place]. Sardines. Chickpeas. Nuts. Tofu. Beverages Tomato juice. Fresh orange juice. Prune juice. Hibiscus tea. Fortified instant breakfast shakes. Condiments Tahini. Fermented soy sauce. Sweets and Desserts Black-strap molasses. Other Wheat germ. The items listed above may not be a complete list of recommended foods or beverages. Contact your dietitian for more options. What foods are not recommended? Grains Whole grains. Bran cereal. Bran flour. Oats. Vegetables Artichokes. Brussels sprouts. Kale. Fruits Blueberries. Raspberries. Strawberries. Figs. Meats and Other Protein Sources Soybeans. Products made from soy protein. Dairy Milk. Cream. Cheese. Yogurt. Cottage cheese. Beverages Coffee. Black tea. Red wine. Sweets and Desserts Cocoa. Chocolate. Ice cream. Other Basil. Oregano. Parsley. The items listed above may not be a complete list of foods and beverages to avoid. Contact your dietitian for more information. This information is not intended to replace advice given to you by your health care provider. Make sure you discuss any questions you have with your health care provider. Document Released: 02/06/2005 Document Revised: 01/13/2016 Document Reviewed: 01/20/2014 Elsevier Interactive Patient Education  [employer].

## 2017-04-22 ENCOUNTER — Ambulatory Visit (INDEPENDENT_AMBULATORY_CARE_PROVIDER_SITE_OTHER): Payer: Medicare Other

## 2017-04-22 ENCOUNTER — Encounter (INDEPENDENT_AMBULATORY_CARE_PROVIDER_SITE_OTHER): Payer: Self-pay | Admitting: Physical Medicine and Rehabilitation

## 2017-04-22 ENCOUNTER — Ambulatory Visit (INDEPENDENT_AMBULATORY_CARE_PROVIDER_SITE_OTHER): Payer: Medicare Other | Admitting: Physical Medicine and Rehabilitation

## 2017-04-22 VITALS — BP 156/51 | HR 66 | Temp 98.3°F

## 2017-04-22 DIAGNOSIS — M47816 Spondylosis without myelopathy or radiculopathy, lumbar region: Secondary | ICD-10-CM

## 2017-04-22 DIAGNOSIS — G8929 Other chronic pain: Secondary | ICD-10-CM | POA: Diagnosis not present

## 2017-04-22 DIAGNOSIS — M545 Low back pain: Secondary | ICD-10-CM | POA: Diagnosis not present

## 2017-04-22 MED ORDER — LIDOCAINE HCL (PF) 1 % IJ SOLN
2.0000 mL | Freq: Once | INTRAMUSCULAR | Status: AC
  Administered 2017-04-22: 2 mL

## 2017-04-22 MED ORDER — METHYLPREDNISOLONE ACETATE 80 MG/ML IJ SUSP
80.0000 mg | Freq: Once | INTRAMUSCULAR | Status: AC
  Administered 2017-04-22: 80 mg

## 2017-04-22 NOTE — Patient Instructions (Signed)

## 2017-04-22 NOTE — Progress Notes (Deleted)
Patient states same Norma [name] thing is going on.  Painful to stand to long, last injections helped.  States that she is anemic, and she does have HEP C, per [facility] her Hemoglobin is low and they are treating it.  + driver, + DYE ALLERGY

## 2017-05-02 ENCOUNTER — Ambulatory Visit: Payer: Medicare Other | Admitting: Cardiology

## 2017-05-02 NOTE — Procedures (Signed)
Mrs. 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  She has had diagnostic facet joint blocks in the past which were very successful.  She comes in today with her last injection being in January.  She feels it is the same problem with pain across the lower back and pain worse with standing and extension.  She has no radicular pain.  She informs [place] today that she is anemic and does have hepatitis C and is having this treated.  She has had no new trauma.  We will repeat facet joint blocks today and eventually could look at possibly radiofrequency ablation.  We discussed medial branch blocks and double block paradigm.  She has had physical therapy. Lumbar Facet Joint Intra-Articular Injection(s) with Fluoroscopic Guidance  Patient: Norma [name]      Date of Birth: [birth year] MRN: [id number] PCP: Bartholome Bill, MD      Visit Date: 04/22/2017   Universal Protocol:    Date/Time: 04/22/2017  Consent Given By: the patient  Position: PRONE   Additional Comments: Vital signs were monitored before and after the procedure. Patient was prepped and draped in the usual sterile fashion. The correct patient, procedure, and site was verified.   Injection Procedure Details:  Procedure Site One Meds Administered:  Meds ordered this encounter  Medications  . lidocaine (PF) (XYLOCAINE) 1 % injection 2 mL  . methylPREDNISolone acetate (DEPO-MEDROL) injection 80 mg     Laterality: Bilateral  Location/Site:  L3-L4 L4-L5  Needle size: 22 guage  Needle type: Spinal  Needle Placement: Articular  Findings:  -Contrast Used: 1 mL iohexol 180 mg iodine/mL   -Comments: Excellent flow of contrast producing a partial arthrogram.  Procedure Details: The fluoroscope beam is vertically oriented in AP, and the inferior recess is visualized beneath the lower pole of the inferior  apophyseal process, which represents the target point for needle insertion. When direct visualization is difficult the target point is located at the medial projection of the vertebral pedicle. The region overlying each aforementioned target is locally anesthetized with a 1 to 2 ml. volume of 1% Lidocaine without Epinephrine.   The spinal needle was inserted into each of the above mentioned facet joints using biplanar fluoroscopic guidance. A 0.25 to 0.5 ml. volume of Isovue-250 was injected and a partial facet joint arthrogram was obtained. A single spot film was obtained of the resulting arthrogram.    One to 1.25 ml of the steroid/anesthetic solution was then injected into each of the facet joints noted above.   Additional Comments:  The patient tolerated the procedure well Dressing: Band-Aid    Post-procedure details: Patient was observed during the procedure. Post-procedure instructions were reviewed.  Patient left the clinic in stable condition.

## 2017-05-07 ENCOUNTER — Other Ambulatory Visit: Payer: Self-pay | Admitting: Medical Oncology

## 2017-05-07 DIAGNOSIS — D638 Anemia in other chronic diseases classified elsewhere: Secondary | ICD-10-CM

## 2017-05-08 ENCOUNTER — Other Ambulatory Visit (HOSPITAL_BASED_OUTPATIENT_CLINIC_OR_DEPARTMENT_OTHER): Payer: Medicare Other

## 2017-05-08 ENCOUNTER — Ambulatory Visit (HOSPITAL_BASED_OUTPATIENT_CLINIC_OR_DEPARTMENT_OTHER): Payer: Medicare Other

## 2017-05-08 DIAGNOSIS — I10 Essential (primary) hypertension: Secondary | ICD-10-CM

## 2017-05-08 DIAGNOSIS — D638 Anemia in other chronic diseases classified elsewhere: Secondary | ICD-10-CM

## 2017-05-08 DIAGNOSIS — N183 Chronic kidney disease, stage 3 (moderate): Secondary | ICD-10-CM

## 2017-05-08 DIAGNOSIS — D631 Anemia in chronic kidney disease: Secondary | ICD-10-CM | POA: Diagnosis not present

## 2017-05-08 LAB — CBC WITH DIFFERENTIAL/PLATELET
BASO%: 0.4 % (ref 0.0–2.0)
Basophils Absolute: 0 10*3/uL (ref 0.0–0.1)
EOS ABS: 0.2 10*3/uL (ref 0.0–0.5)
EOS%: 3.2 % (ref 0.0–7.0)
HCT: 31.9 % — ABNORMAL LOW (ref 34.8–46.6)
HGB: 9.6 g/dL — ABNORMAL LOW (ref 11.6–15.9)
LYMPH%: 10.9 % — AB (ref 14.0–49.7)
MCH: 27.4 pg (ref 25.1–34.0)
MCHC: 30.1 g/dL — ABNORMAL LOW (ref 31.5–36.0)
MCV: 91.1 fL (ref 79.5–101.0)
MONO#: 0.4 10*3/uL (ref 0.1–0.9)
MONO%: 7.9 % (ref 0.0–14.0)
NEUT#: 3.8 10*3/uL (ref 1.5–6.5)
NEUT%: 77.6 % — AB (ref 38.4–76.8)
PLATELETS: 215 10*3/uL (ref 145–400)
RBC: 3.5 10*6/uL — AB (ref 3.70–5.45)
RDW: 17.9 % — ABNORMAL HIGH (ref 11.2–14.5)
WBC: 5 10*3/uL (ref 3.9–10.3)
lymph#: 0.5 10*3/uL — ABNORMAL LOW (ref 0.9–3.3)

## 2017-05-08 LAB — IRON AND TIBC
%SAT: 19 % — ABNORMAL LOW (ref 21–57)
Iron: 42 ug/dL (ref 41–142)
TIBC: 219 ug/dL — ABNORMAL LOW (ref 236–444)
UIBC: 177 ug/dL (ref 120–384)

## 2017-05-08 LAB — FERRITIN: FERRITIN: 129 ng/mL (ref 9–269)

## 2017-05-08 NOTE — Patient Instructions (Signed)
Darbepoetin Alfa injection What is this medicine? DARBEPOETIN ALFA (dar be POE e tin AL fa) helps your body make more red blood cells. It is used to treat anemia caused by chronic kidney failure and chemotherapy. This medicine may be used for other purposes; ask your health care provider or pharmacist if you have questions. COMMON BRAND NAME(S): Aranesp What should I tell my health care provider before I take this medicine? They need to know if you have any of these conditions: -blood clotting disorders or history of blood clots -cancer patient not on chemotherapy -cystic fibrosis -heart disease, such as angina, heart failure, or a history of a heart attack -hemoglobin level of 12 g/dL or greater -high blood pressure -low levels of folate, iron, or vitamin B12 -seizures -an unusual or allergic reaction to darbepoetin, erythropoietin, albumin, hamster proteins, latex, other medicines, foods, dyes, or preservatives -pregnant or trying to get pregnant -breast-feeding How should I use this medicine? This medicine is for injection into a vein or under the skin. It is usually given by a health care professional in a hospital or clinic setting. If you get this medicine at home, you will be taught how to prepare and give this medicine. Use exactly as directed. Take your medicine at regular intervals. Do not take your medicine more often than directed. It is important that you put your used needles and syringes in a special sharps container. Do not put them in a trash can. If you do not have a sharps container, call your pharmacist or healthcare provider to get one. A special MedGuide will be given to you by the pharmacist with each prescription and refill. Be sure to read this information carefully each time. Talk to your pediatrician regarding the use of this medicine in children. While this medicine may be used in children as young as 1 year for selected conditions, precautions do  apply. Overdosage: If you think you have taken too much of this medicine contact a poison control center or emergency room at once. NOTE: This medicine is only for you. Do not share this medicine with others. What if I miss a dose? If you miss a dose, take it as soon as you can. If it is almost time for your next dose, take only that dose. Do not take double or extra doses. What may interact with this medicine? Do not take this medicine with any of the following medications: -epoetin alfa This list may not describe all possible interactions. Give your health care provider a list of all the medicines, herbs, non-prescription drugs, or dietary supplements you use. Also tell them if you smoke, drink alcohol, or use illegal drugs. Some items may interact with your medicine. What should I watch for while using this medicine? Your condition will be monitored carefully while you are receiving this medicine. You may need blood work done while you are taking this medicine. What side effects may I notice from receiving this medicine? Side effects that you should report to your doctor or health care professional as soon as possible: -allergic reactions like skin rash, itching or hives, swelling of the face, lips, or tongue -breathing problems -changes in vision -chest pain -confusion, trouble speaking or understanding -feeling faint or lightheaded, falls -high blood pressure -muscle aches or pains -pain, swelling, warmth in the leg -rapid weight gain -severe headaches -sudden numbness or weakness of the face, arm or leg -trouble walking, dizziness, loss of balance or coordination -seizures (convulsions) -swelling of the ankles, feet, hands -  unusually weak or tired Side effects that usually do not require medical attention (report to your doctor or health care professional if they continue or are bothersome): -diarrhea -fever, chills (flu-like symptoms) -headaches -nausea, vomiting -redness,  stinging, or swelling at site where injected This list may not describe all possible side effects. Call your doctor for medical advice about side effects. You may report side effects to FDA at 1-800-FDA-1088. Where should I keep my medicine? Keep out of the reach of children. Store in a refrigerator between 2 and 8 degrees C (36 and 46 degrees F). Do not freeze. Do not shake. Throw away any unused portion if using a single-dose vial. Throw away any unused medicine after the expiration date. NOTE: This sheet is a summary. It may not cover all possible information. If you have questions about this medicine, talk to your doctor, pharmacist, or health care provider.  2018 Elsevier/Gold Standard (2016-02-13 19:52:26)  Iron-Rich Diet Iron is a mineral that helps your body to produce hemoglobin. Hemoglobin is a protein in your red blood cells that carries oxygen to your body's tissues. Eating too little iron may cause you to feel weak and tired, and it can increase your risk for infection. Eating enough iron is necessary for your body's metabolism, muscle function, and nervous system. Iron is naturally found in many foods. It can also be added to foods or fortified in foods. There are two types of dietary iron:  Heme iron. Heme iron is absorbed by the body more easily than nonheme iron. Heme iron is found in meat, poultry, and fish.  Nonheme iron. Nonheme iron is found in dietary supplements, iron-fortified grains, beans, and vegetables.  You may need to follow an iron-rich diet if:  You have been diagnosed with iron deficiency or iron-deficiency anemia.  You have a condition that prevents you from absorbing dietary iron, such as: ? Infection in your intestines. ? Celiac disease. This involves long-lasting (chronic) inflammation of your intestines.  You do not eat enough iron.  You eat a diet that is high in foods that impair iron absorption.  You have lost a lot of blood.  You have heavy  bleeding during your menstrual cycle.  You are pregnant.  What is my plan? Your health care provider may help you to determine how much iron you need per day based on your condition. Generally, when a person consumes sufficient amounts of iron in the diet, the following iron needs are met:  Men. ? 14-18 years old: 11 mg per day. ? 19-50 years old: 8 mg per day.  Women. ? 14-18 years old: 15 mg per day. ? 19-50 years old: 18 mg per day. ? Over 50 years old: 8 mg per day. ? Pregnant women: 27 mg per day. ? Breastfeeding women: 9 mg per day.  What do I need to know about an iron-rich diet?  Eat fresh fruits and vegetables that are high in vitamin C along with foods that are high in iron. This will help increase the amount of iron that your body absorbs from food, especially with foods containing nonheme iron. Foods that are high in vitamin C include oranges, peppers, tomatoes, and mango.  Take iron supplements only as directed by your health care provider. Overdose of iron can be life-threatening. If you were prescribed iron supplements, take them with orange juice or a vitamin C supplement.  Cook foods in pots and pans that are made from iron.  Eat nonheme iron-containing foods alongside foods   that are high in heme iron. This helps to improve your iron absorption.  Certain foods and drinks contain compounds that impair iron absorption. Avoid eating these foods in the same meal as iron-rich foods or with iron supplements. These include: ? Coffee, black tea, and red wine. ? Milk, dairy products, and foods that are high in calcium. ? Beans, soybeans, and peas. ? Whole grains.  When eating foods that contain both nonheme iron and compounds that impair iron absorption, follow these tips to absorb iron better. ? Soak beans overnight before cooking. ? Soak whole grains overnight and drain them before using. ? Ferment flours before baking, such as using yeast in bread dough. What foods  can I eat? Grains Iron-fortified breakfast cereal. Iron-fortified whole-wheat bread. Enriched rice. Sprouted grains. Vegetables Spinach. Potatoes with skin. Green peas. Broccoli. Red and green bell peppers. Fermented vegetables. Fruits Prunes. Raisins. Oranges. Strawberries. Mango. Grapefruit. Meats and Other Protein Sources Beef liver. Oysters. Beef. Shrimp. [place]. Chicken. [place]. Sardines. Chickpeas. Nuts. Tofu. Beverages Tomato juice. Fresh orange juice. Prune juice. Hibiscus tea. Fortified instant breakfast shakes. Condiments Tahini. Fermented soy sauce. Sweets and Desserts Black-strap molasses. Other Wheat germ. The items listed above may not be a complete list of recommended foods or beverages. Contact your dietitian for more options. What foods are not recommended? Grains Whole grains. Bran cereal. Bran flour. Oats. Vegetables Artichokes. Brussels sprouts. Kale. Fruits Blueberries. Raspberries. Strawberries. Figs. Meats and Other Protein Sources Soybeans. Products made from soy protein. Dairy Milk. Cream. Cheese. Yogurt. Cottage cheese. Beverages Coffee. Black tea. Red wine. Sweets and Desserts Cocoa. Chocolate. Ice cream. Other Basil. Oregano. Parsley. The items listed above may not be a complete list of foods and beverages to avoid. Contact your dietitian for more information. This information is not intended to replace advice given to you by your health care provider. Make sure you discuss any questions you have with your health care provider. Document Released: 02/06/2005 Document Revised: 01/13/2016 Document Reviewed: 01/20/2014 Elsevier Interactive Patient Education  [employer].

## 2017-05-08 NOTE — Progress Notes (Signed)
Blood pressure readings from 195/65 to 182/62. Discussed findings with Dr. Julien Nordmann. Per Dr. Julien Nordmann, Aranesp  Injection held. Pt instructed to be compliant with taking blood pressure medications. And, to follow up with  PCP to get better control of  Blood pressure which is  Vital  For Aranesp  Treatment.

## 2017-05-29 ENCOUNTER — Ambulatory Visit (HOSPITAL_BASED_OUTPATIENT_CLINIC_OR_DEPARTMENT_OTHER): Payer: Medicare Other | Admitting: Internal Medicine

## 2017-05-29 ENCOUNTER — Ambulatory Visit (HOSPITAL_BASED_OUTPATIENT_CLINIC_OR_DEPARTMENT_OTHER): Payer: Medicare Other

## 2017-05-29 ENCOUNTER — Other Ambulatory Visit (HOSPITAL_BASED_OUTPATIENT_CLINIC_OR_DEPARTMENT_OTHER): Payer: Medicare Other

## 2017-05-29 ENCOUNTER — Encounter: Payer: Self-pay | Admitting: Internal Medicine

## 2017-05-29 VITALS — HR 55 | Temp 97.9°F | Resp 18 | Ht 62.0 in | Wt 206.3 lb

## 2017-05-29 DIAGNOSIS — D638 Anemia in other chronic diseases classified elsewhere: Secondary | ICD-10-CM

## 2017-05-29 DIAGNOSIS — N183 Chronic kidney disease, stage 3 (moderate): Secondary | ICD-10-CM

## 2017-05-29 DIAGNOSIS — N184 Chronic kidney disease, stage 4 (severe): Secondary | ICD-10-CM

## 2017-05-29 DIAGNOSIS — D631 Anemia in chronic kidney disease: Secondary | ICD-10-CM

## 2017-05-29 LAB — CBC WITH DIFFERENTIAL/PLATELET
BASO%: 0.2 % (ref 0.0–2.0)
BASOS ABS: 0 10*3/uL (ref 0.0–0.1)
EOS ABS: 0.2 10*3/uL (ref 0.0–0.5)
EOS%: 3.1 % (ref 0.0–7.0)
HCT: 31.3 % — ABNORMAL LOW (ref 34.8–46.6)
HGB: 9.5 g/dL — ABNORMAL LOW (ref 11.6–15.9)
LYMPH%: 13.9 % — AB (ref 14.0–49.7)
MCH: 27.5 pg (ref 25.1–34.0)
MCHC: 30.4 g/dL — ABNORMAL LOW (ref 31.5–36.0)
MCV: 90.7 fL (ref 79.5–101.0)
MONO#: 0.4 10*3/uL (ref 0.1–0.9)
MONO%: 6.3 % (ref 0.0–14.0)
NEUT#: 4.4 10*3/uL (ref 1.5–6.5)
NEUT%: 76.5 % (ref 38.4–76.8)
PLATELETS: 226 10*3/uL (ref 145–400)
RBC: 3.45 10*6/uL — AB (ref 3.70–5.45)
RDW: 17.1 % — ABNORMAL HIGH (ref 11.2–14.5)
WBC: 5.8 10*3/uL (ref 3.9–10.3)
lymph#: 0.8 10*3/uL — ABNORMAL LOW (ref 0.9–3.3)

## 2017-05-29 LAB — IRON AND TIBC
%SAT: 19 % — ABNORMAL LOW (ref 21–57)
IRON: 38 ug/dL — AB (ref 41–142)
TIBC: 203 ug/dL — ABNORMAL LOW (ref 236–444)
UIBC: 164 ug/dL (ref 120–384)

## 2017-05-29 LAB — FERRITIN: Ferritin: 181 ng/ml (ref 9–269)

## 2017-05-29 MED ORDER — DARBEPOETIN ALFA 300 MCG/0.6ML IJ SOSY
300.0000 ug | PREFILLED_SYRINGE | Freq: Once | INTRAMUSCULAR | Status: AC
  Administered 2017-05-29: 300 ug via SUBCUTANEOUS
  Filled 2017-05-29: qty 0.6

## 2017-05-29 NOTE — Progress Notes (Signed)
[place] [phone]   [phone]  OFFICE PROGRESS NOTE  Bartholome Bill, MD [id number] [id number]  DIAGNOSIS: Anemia of chronic disease secondary to chronic renal insufficiency.  PRIOR THERAPY: None  CURRENT THERAPY: Aranesp 300 g subcutaneously every 3 weeks. First dose 03/06/2017. Over-the-counter ferrous sulfate.  INTERVAL HISTORY: Norma [name] 71 y.o. female returns to the clinic today for follow-up visit.  The patient is feeling fine today with no specific complaints except for mild fatigue.  She continues to tolerate her treatment with Aranesp fairly well.  She is also taking ferrous sulfate over-the-counter 1 tablet daily.  She denied having any bleeding issues.  She has no nausea, vomiting, diarrhea or constipation.  She denied having any weight loss or night sweats.  She is here today for evaluation and repeat iron study.  MEDICAL HISTORY: Past Medical History:  Diagnosis Date  . Anxiety   . Carotid artery occlusion   . CKD (chronic kidney disease) stage 3, GFR 30-59 ml/min (HCC) 04/18/2014  . Claudication ([place])   . Constipation    Due to patient taking iron supplements  . Coronary artery disease   . DDD (degenerative disc disease)   . Diabetes mellitus   . Diabetic coma ([place])   . DJD (degenerative joint disease)   . DJD (degenerative joint disease)   . Edema    Bilateral lower extermities  . Gout   . Hyperlipidemia   . Hypertension   . Iron deficiency anemia   . Leg pain   . Obesity   . PAD (peripheral artery disease) ([place])   . Shortness of breath    exertion    ALLERGIES:  is allergic to iohexol and sulfa antibiotics.  MEDICATIONS:  Current Outpatient Medications  Medication Sig Dispense Refill  . allopurinol (ZYLOPRIM) 100 MG tablet Take 100 mg by mouth daily as needed.    Marland Kitchen aspirin EC 325 MG EC tablet Take 1 tablet (325 mg total) by mouth daily with breakfast. 30 tablet 0  .  atorvastatin (LIPITOR) 80 MG tablet Take 1 tablet (80 mg total) by mouth daily. (Patient taking differently: Take 80 mg by mouth every morning. ) 30 tablet 6  . carvedilol (COREG) 12.5 MG tablet Take 1 tablet (12.5 mg total) by mouth 2 (two) times daily. 180 tablet 3  . cholecalciferol (VITAMIN D) 1000 units tablet Take 1,000 Units by mouth daily as needed (supplement).    . cloNIDine (CATAPRES) 0.3 MG tablet Take 0.3 mg by mouth 2 (two) times daily.     . Coenzyme Q10 200 MG capsule Take 200 mg by mouth every morning.     . ferrous sulfate 325 (65 FE) MG tablet Take 325 mg by mouth daily with breakfast.    . furosemide (LASIX) 80 MG tablet Take 80 mg by mouth daily.     Marland Kitchen glucose blood test strip 1.8 each by Other route as needed for other. Use as instructed    . hydrALAZINE (APRESOLINE) 50 MG tablet Take 50 mg by mouth 3 (three) times daily.    Marland Kitchen HYDROcodone-acetaminophen (NORCO/VICODIN) 5-325 MG tablet Take 1 tablet by mouth every 6 (six) hours as needed for moderate pain. 60 tablet 0  . insulin glargine (LANTUS) 100 UNIT/ML injection Inject 0.2 mLs (20 Units total) into the skin 2 (two) times daily. 10 mL 11  . isosorbide dinitrate (ISORDIL) 20 MG tablet Take 40 mg by mouth 2 (two) times  daily.     . liraglutide (VICTOZA) 18 MG/3ML SOPN Inject 1.8 mg into the skin every morning.    Marland Kitchen losartan (COZAAR) 100 MG tablet Take 100 mg by mouth daily.    . Magnesium 250 MG TABS Take 250 mg by mouth every morning.     Marland Kitchen MAVYRET 100-40 MG TABS   2  . OVER THE COUNTER MEDICATION Over the counter laxative from [facility]    . predniSONE (DELTASONE) 10 MG tablet Take 30 mg by mouth daily with breakfast.    . Probiotic Product (PROBIOTIC DAILY PO) Take 1 tablet by mouth daily as needed (supplement).     . vitamin B-12 (CYANOCOBALAMIN) 500 MCG tablet Take 500 mcg by mouth every morning.      No current facility-administered medications for this visit.     SURGICAL HISTORY:  Past Surgical History:    Procedure Laterality Date  . AORTA - BILATERAL FEMORAL ARTERY BYPASS GRAFT  05/25/2011   Procedure: AORTA BIFEMORAL BYPASS GRAFT;  Surgeon: Mal Misty, MD;  Location: [place];  Service: Vascular;  Laterality: N/A;  . APPLICATION OF WOUND VAC Right 04/23/2016   Procedure: APPLICATION OF WOUND VAC CHANGED;  Surgeon: Meredith Pel, MD;  Location: [facility];  Service: Orthopedics;  Laterality: Right;  . BREAST EXCISIONAL BIOPSY Right   . BREAST SURGERY    . CARDIAC CATHETERIZATION  12/01/2009   EF 65%  . CARDIOVASCULAR STRESS TEST  11/28/2009   EF 75%  . COLONOSCOPY W/ POLYPECTOMY    . CORONARY ARTERY BYPASS GRAFT  12/08/2009   LIMA GRAFT TO THE DISTAL LAD AND SAPHENOUS VEIN GRAFT TO THE OBTUSE MARGINAL VESSEL  . EYE SURGERY    . I&D EXTREMITY Right 04/23/2016   Procedure: IRRIGATION AND DEBRIDEMENT EXTREMITY;  Surgeon: Meredith Pel, MD;  Location: [facility];  Service: Orthopedics;  Laterality: Right;  . INCISION AND DRAINAGE HIP Right 04/20/2016   Procedure: IRRIGATION AND DEBRIDEMENT HIP WITH POLY EXCHANGE, ABX BEAD PLACEMENT, WOUND VAC ;  Surgeon: Meredith Pel, MD;  Location: [facility];  Service: Orthopedics;  Laterality: Right;  RIGHT HIP SUPERFICIAL I&D, POSSIBLE DEEP I&D, LINER EXCHANGE, ABX BEAD PLACEMENT, WOUND VAC.   Marland Kitchen PR VEIN BYPASS GRAFT,AORTO-FEM-POP  05/25/11  . REMOVAL OF FIBROUS CYST FROM RIGHT BREAST  10+ YEARS  . RETINAL DETACHMENT SURGERY  10+ YEARS   LEFT EYE  . TOTAL HIP ARTHROPLASTY Right 03/06/2016  . TOTAL HIP ARTHROPLASTY Right 03/06/2016   Procedure: RIGHT TOTAL HIP ARTHROPLASTY ANTERIOR APPROACH;  Surgeon: Meredith Pel, MD;  Location: [facility];  Service: Orthopedics;  Laterality: Right;  . TRANSTHORACIC ECHOCARDIOGRAM  12/01/2009   EF 60-65%    REVIEW OF SYSTEMS:  A comprehensive review of systems was negative except for: Constitutional: positive for fatigue   PHYSICAL EXAMINATION: General appearance: alert, cooperative, fatigued and no distress Head:  Normocephalic, without obvious abnormality, atraumatic Neck: no adenopathy, no JVD, supple, symmetrical, trachea midline and thyroid not enlarged, symmetric, no tenderness/mass/nodules Lymph nodes: Cervical, supraclavicular, and axillary nodes normal. Resp: clear to auscultation bilaterally Back: symmetric, no curvature. ROM normal. No CVA tenderness. Cardio: regular rate and rhythm, S1, S2 normal, no murmur, click, rub or gallop GI: soft, non-tender; bowel sounds normal; no masses,  no organomegaly Extremities: extremities normal, atraumatic, no cyanosis or edema  ECOG PERFORMANCE STATUS: 1 - Symptomatic but completely ambulatory  Pulse (!) 55, temperature 97.9 F (36.6 C), temperature source Oral, resp. rate 18, height 5\' 2"  (1.575 m), weight 206  lb 4.8 oz (93.6 kg), SpO2 96 %.  LABORATORY DATA: Lab Results  Component Value Date   WBC 5.8 05/29/2017   HGB 9.5 (L) 05/29/2017   HCT 31.3 (L) 05/29/2017   MCV 90.7 05/29/2017   PLT 226 05/29/2017      Chemistry      Component Value Date/Time   NA 145 04/17/2017 1036   K 4.5 04/17/2017 1036   CL 113 (H) 04/24/2016 0852   CO2 25 04/17/2017 1036   BUN 48.4 (H) 04/17/2017 1036   CREATININE 2.3 (H) 04/17/2017 1036      Component Value Date/Time   CALCIUM 8.4 04/17/2017 1036   ALKPHOS 99 04/17/2017 1036   AST 12 04/17/2017 1036   ALT 10 04/17/2017 1036   BILITOT 0.52 04/17/2017 1036       RADIOGRAPHIC STUDIES: No results found.  ASSESSMENT AND PLAN:  This is a very pleasant 71 years old African-American female with anemia of chronic disease secondary to renal insufficiency plus/minus iron deficiency. The patient is currently on treatment with Aranesp 300 mcg every 3 weeks in addition to over-the-counter oral iron tablets.  Her serum iron as well as iron saturation or below the normal range today.  I recommended for her to increase her dose of the oral iron to 2 tablets a day.  I also recommend for the patient to continue her  current treatment with Aranesp for now. I will see her back for follow-up visit in 3 months for evaluation with repeat CBC, iron studies and ferritin. She was advised to call immediately if she has any concerning symptoms in the interval. The patient voices understanding of current disease status and treatment options and is in agreement with the current care plan. All questions were answered. The patient knows to call the clinic with any problems, questions or concerns. We can certainly see the patient much sooner if necessary.  Disclaimer: This note was dictated with voice recognition software. Similar sounding words can inadvertently be transcribed and may not be corrected upon review.

## 2017-05-29 NOTE — Patient Instructions (Signed)
Darbepoetin Alfa injection What is this medicine? DARBEPOETIN ALFA (dar be POE e tin AL fa) helps your body make more red blood cells. It is used to treat anemia caused by chronic kidney failure and chemotherapy. This medicine may be used for other purposes; ask your health care provider or pharmacist if you have questions. COMMON BRAND NAME(S): Aranesp What should I tell my health care provider before I take this medicine? They need to know if you have any of these conditions: -blood clotting disorders or history of blood clots -cancer patient not on chemotherapy -cystic fibrosis -heart disease, such as angina, heart failure, or a history of a heart attack -hemoglobin level of 12 g/dL or greater -high blood pressure -low levels of folate, iron, or vitamin B12 -seizures -an unusual or allergic reaction to darbepoetin, erythropoietin, albumin, hamster proteins, latex, other medicines, foods, dyes, or preservatives -pregnant or trying to get pregnant -breast-feeding How should I use this medicine? This medicine is for injection into a vein or under the skin. It is usually given by a health care professional in a hospital or clinic setting. If you get this medicine at home, you will be taught how to prepare and give this medicine. Use exactly as directed. Take your medicine at regular intervals. Do not take your medicine more often than directed. It is important that you put your used needles and syringes in a special sharps container. Do not put them in a trash can. If you do not have a sharps container, call your pharmacist or healthcare provider to get one. A special MedGuide will be given to you by the pharmacist with each prescription and refill. Be sure to read this information carefully each time. Talk to your pediatrician regarding the use of this medicine in children. While this medicine may be used in children as young as 1 year for selected conditions, precautions do  apply. Overdosage: If you think you have taken too much of this medicine contact a poison control center or emergency room at once. NOTE: This medicine is only for you. Do not share this medicine with others. What if I miss a dose? If you miss a dose, take it as soon as you can. If it is almost time for your next dose, take only that dose. Do not take double or extra doses. What may interact with this medicine? Do not take this medicine with any of the following medications: -epoetin alfa This list may not describe all possible interactions. Give your health care provider a list of all the medicines, herbs, non-prescription drugs, or dietary supplements you use. Also tell them if you smoke, drink alcohol, or use illegal drugs. Some items may interact with your medicine. What should I watch for while using this medicine? Your condition will be monitored carefully while you are receiving this medicine. You may need blood work done while you are taking this medicine. What side effects may I notice from receiving this medicine? Side effects that you should report to your doctor or health care professional as soon as possible: -allergic reactions like skin rash, itching or hives, swelling of the face, lips, or tongue -breathing problems -changes in vision -chest pain -confusion, trouble speaking or understanding -feeling faint or lightheaded, falls -high blood pressure -muscle aches or pains -pain, swelling, warmth in the leg -rapid weight gain -severe headaches -sudden numbness or weakness of the face, arm or leg -trouble walking, dizziness, loss of balance or coordination -seizures (convulsions) -swelling of the ankles, feet, hands -  unusually weak or tired Side effects that usually do not require medical attention (report to your doctor or health care professional if they continue or are bothersome): -diarrhea -fever, chills (flu-like symptoms) -headaches -nausea, vomiting -redness,  stinging, or swelling at site where injected This list may not describe all possible side effects. Call your doctor for medical advice about side effects. You may report side effects to FDA at 1-800-FDA-1088. Where should I keep my medicine? Keep out of the reach of children. Store in a refrigerator between 2 and 8 degrees C (36 and 46 degrees F). Do not freeze. Do not shake. Throw away any unused portion if using a single-dose vial. Throw away any unused medicine after the expiration date. NOTE: This sheet is a summary. It may not cover all possible information. If you have questions about this medicine, talk to your doctor, pharmacist, or health care provider.  2018 Elsevier/Gold Standard (2016-02-13 19:52:26)  Iron-Rich Diet Iron is a mineral that helps your body to produce hemoglobin. Hemoglobin is a protein in your red blood cells that carries oxygen to your body's tissues. Eating too little iron may cause you to feel weak and tired, and it can increase your risk for infection. Eating enough iron is necessary for your body's metabolism, muscle function, and nervous system. Iron is naturally found in many foods. It can also be added to foods or fortified in foods. There are two types of dietary iron:  Heme iron. Heme iron is absorbed by the body more easily than nonheme iron. Heme iron is found in meat, poultry, and fish.  Nonheme iron. Nonheme iron is found in dietary supplements, iron-fortified grains, beans, and vegetables.  You may need to follow an iron-rich diet if:  You have been diagnosed with iron deficiency or iron-deficiency anemia.  You have a condition that prevents you from absorbing dietary iron, such as: ? Infection in your intestines. ? Celiac disease. This involves long-lasting (chronic) inflammation of your intestines.  You do not eat enough iron.  You eat a diet that is high in foods that impair iron absorption.  You have lost a lot of blood.  You have heavy  bleeding during your menstrual cycle.  You are pregnant.  What is my plan? Your health care provider may help you to determine how much iron you need per day based on your condition. Generally, when a person consumes sufficient amounts of iron in the diet, the following iron needs are met:  Men. ? 14-18 years old: 11 mg per day. ? 19-50 years old: 8 mg per day.  Women. ? 14-18 years old: 15 mg per day. ? 19-50 years old: 18 mg per day. ? Over 50 years old: 8 mg per day. ? Pregnant women: 27 mg per day. ? Breastfeeding women: 9 mg per day.  What do I need to know about an iron-rich diet?  Eat fresh fruits and vegetables that are high in vitamin C along with foods that are high in iron. This will help increase the amount of iron that your body absorbs from food, especially with foods containing nonheme iron. Foods that are high in vitamin C include oranges, peppers, tomatoes, and mango.  Take iron supplements only as directed by your health care provider. Overdose of iron can be life-threatening. If you were prescribed iron supplements, take them with orange juice or a vitamin C supplement.  Cook foods in pots and pans that are made from iron.  Eat nonheme iron-containing foods alongside foods   that are high in heme iron. This helps to improve your iron absorption.  Certain foods and drinks contain compounds that impair iron absorption. Avoid eating these foods in the same meal as iron-rich foods or with iron supplements. These include: ? Coffee, black tea, and red wine. ? Milk, dairy products, and foods that are high in calcium. ? Beans, soybeans, and peas. ? Whole grains.  When eating foods that contain both nonheme iron and compounds that impair iron absorption, follow these tips to absorb iron better. ? Soak beans overnight before cooking. ? Soak whole grains overnight and drain them before using. ? Ferment flours before baking, such as using yeast in bread dough. What foods  can I eat? Grains Iron-fortified breakfast cereal. Iron-fortified whole-wheat bread. Enriched rice. Sprouted grains. Vegetables Spinach. Potatoes with skin. Green peas. Broccoli. Red and green bell peppers. Fermented vegetables. Fruits Prunes. Raisins. Oranges. Strawberries. Mango. Grapefruit. Meats and Other Protein Sources Beef liver. Oysters. Beef. Shrimp. [place]. Chicken. [place]. Sardines. Chickpeas. Nuts. Tofu. Beverages Tomato juice. Fresh orange juice. Prune juice. Hibiscus tea. Fortified instant breakfast shakes. Condiments Tahini. Fermented soy sauce. Sweets and Desserts Black-strap molasses. Other Wheat germ. The items listed above may not be a complete list of recommended foods or beverages. Contact your dietitian for more options. What foods are not recommended? Grains Whole grains. Bran cereal. Bran flour. Oats. Vegetables Artichokes. Brussels sprouts. Kale. Fruits Blueberries. Raspberries. Strawberries. Figs. Meats and Other Protein Sources Soybeans. Products made from soy protein. Dairy Milk. Cream. Cheese. Yogurt. Cottage cheese. Beverages Coffee. Black tea. Red wine. Sweets and Desserts Cocoa. Chocolate. Ice cream. Other Basil. Oregano. Parsley. The items listed above may not be a complete list of foods and beverages to avoid. Contact your dietitian for more information. This information is not intended to replace advice given to you by your health care provider. Make sure you discuss any questions you have with your health care provider. Document Released: 02/06/2005 Document Revised: 01/13/2016 Document Reviewed: 01/20/2014 Elsevier Interactive Patient Education  [employer].

## 2017-06-12 ENCOUNTER — Telehealth: Payer: Self-pay | Admitting: Internal Medicine

## 2017-06-12 NOTE — Telephone Encounter (Signed)
Spoke with patient re appt. Schedule mailed °

## 2017-06-19 ENCOUNTER — Other Ambulatory Visit: Payer: Self-pay | Admitting: Medical Oncology

## 2017-06-19 ENCOUNTER — Telehealth: Payer: Self-pay | Admitting: Internal Medicine

## 2017-06-19 ENCOUNTER — Inpatient Hospital Stay: Payer: Medicare Other

## 2017-06-19 DIAGNOSIS — D638 Anemia in other chronic diseases classified elsewhere: Secondary | ICD-10-CM

## 2017-06-19 NOTE — Telephone Encounter (Signed)
Patient called in to reschedule her lab and inj due to weather so because they are every 3 wks we had reschedule every appointment after that.

## 2017-06-28 ENCOUNTER — Inpatient Hospital Stay (HOSPITAL_BASED_OUTPATIENT_CLINIC_OR_DEPARTMENT_OTHER): Payer: Medicare Other

## 2017-06-28 ENCOUNTER — Inpatient Hospital Stay: Payer: Medicare Other | Attending: Internal Medicine

## 2017-06-28 VITALS — BP 144/68 | HR 54 | Temp 98.2°F | Resp 20

## 2017-06-28 DIAGNOSIS — N183 Chronic kidney disease, stage 3 (moderate): Secondary | ICD-10-CM

## 2017-06-28 DIAGNOSIS — N184 Chronic kidney disease, stage 4 (severe): Secondary | ICD-10-CM

## 2017-06-28 DIAGNOSIS — D631 Anemia in chronic kidney disease: Secondary | ICD-10-CM | POA: Diagnosis not present

## 2017-06-28 DIAGNOSIS — D638 Anemia in other chronic diseases classified elsewhere: Secondary | ICD-10-CM

## 2017-06-28 LAB — CBC WITH DIFFERENTIAL/PLATELET
BASO%: 0.4 % (ref 0.0–2.0)
Basophils Absolute: 0 10*3/uL (ref 0.0–0.1)
EOS ABS: 0.1 10*3/uL (ref 0.0–0.5)
EOS%: 2.5 % (ref 0.0–7.0)
HEMATOCRIT: 31.8 % — AB (ref 34.8–46.6)
HEMOGLOBIN: 9.7 g/dL — AB (ref 11.6–15.9)
LYMPH#: 0.9 10*3/uL (ref 0.9–3.3)
LYMPH%: 18.3 % (ref 14.0–49.7)
MCH: 27.6 pg (ref 25.1–34.0)
MCHC: 30.5 g/dL — ABNORMAL LOW (ref 31.5–36.0)
MCV: 90.6 fL (ref 79.5–101.0)
MONO#: 0.4 10*3/uL (ref 0.1–0.9)
MONO%: 8 % (ref 0.0–14.0)
NEUT%: 70.8 % (ref 38.4–76.8)
NEUTROS ABS: 3.7 10*3/uL (ref 1.5–6.5)
PLATELETS: 229 10*3/uL (ref 145–400)
RBC: 3.51 10*6/uL — AB (ref 3.70–5.45)
RDW: 16.8 % — AB (ref 11.2–14.5)
WBC: 5.2 10*3/uL (ref 3.9–10.3)

## 2017-06-28 LAB — COMPREHENSIVE METABOLIC PANEL
ALBUMIN: 2.1 g/dL — AB (ref 3.5–5.0)
ALK PHOS: 86 U/L (ref 40–150)
ALT: <6 U/L (ref 0–55)
AST: 11 U/L (ref 5–34)
Anion Gap: 8 mEq/L (ref 3–11)
BUN: 48.6 mg/dL — ABNORMAL HIGH (ref 7.0–26.0)
CALCIUM: 8.3 mg/dL — AB (ref 8.4–10.4)
CHLORIDE: 113 meq/L — AB (ref 98–109)
CO2: 22 mEq/L (ref 22–29)
Creatinine: 2.9 mg/dL — ABNORMAL HIGH (ref 0.6–1.1)
EGFR: 18 mL/min/{1.73_m2} — AB (ref >60)
Glucose: 143 mg/dl — ABNORMAL HIGH (ref 70–140)
POTASSIUM: 4.8 meq/L (ref 3.5–5.1)
Sodium: 143 mEq/L (ref 136–145)
Total Bilirubin: 0.28 mg/dL (ref 0.20–1.20)
Total Protein: 5.6 g/dL — ABNORMAL LOW (ref 6.4–8.3)

## 2017-06-28 MED ORDER — DARBEPOETIN ALFA 300 MCG/0.6ML IJ SOSY
300.0000 ug | PREFILLED_SYRINGE | Freq: Once | INTRAMUSCULAR | Status: AC
  Administered 2017-06-28: 300 ug via SUBCUTANEOUS

## 2017-06-28 NOTE — Patient Instructions (Signed)
Darbepoetin Alfa injection What is this medicine? DARBEPOETIN ALFA (dar be POE e tin AL fa) helps your body make more red blood cells. It is used to treat anemia caused by chronic kidney failure and chemotherapy. This medicine may be used for other purposes; ask your health care provider or pharmacist if you have questions. COMMON BRAND NAME(S): Aranesp What should I tell my health care provider before I take this medicine? They need to know if you have any of these conditions: -blood clotting disorders or history of blood clots -cancer patient not on chemotherapy -cystic fibrosis -heart disease, such as angina, heart failure, or a history of a heart attack -hemoglobin level of 12 g/dL or greater -high blood pressure -low levels of folate, iron, or vitamin B12 -seizures -an unusual or allergic reaction to darbepoetin, erythropoietin, albumin, hamster proteins, latex, other medicines, foods, dyes, or preservatives -pregnant or trying to get pregnant -breast-feeding How should I use this medicine? This medicine is for injection into a vein or under the skin. It is usually given by a health care professional in a hospital or clinic setting. If you get this medicine at home, you will be taught how to prepare and give this medicine. Use exactly as directed. Take your medicine at regular intervals. Do not take your medicine more often than directed. It is important that you put your used needles and syringes in a special sharps container. Do not put them in a trash can. If you do not have a sharps container, call your pharmacist or healthcare provider to get one. A special MedGuide will be given to you by the pharmacist with each prescription and refill. Be sure to read this information carefully each time. Talk to your pediatrician regarding the use of this medicine in children. While this medicine may be used in children as young as 1 year for selected conditions, precautions do  apply. Overdosage: If you think you have taken too much of this medicine contact a poison control center or emergency room at once. NOTE: This medicine is only for you. Do not share this medicine with others. What if I miss a dose? If you miss a dose, take it as soon as you can. If it is almost time for your next dose, take only that dose. Do not take double or extra doses. What may interact with this medicine? Do not take this medicine with any of the following medications: -epoetin alfa This list may not describe all possible interactions. Give your health care provider a list of all the medicines, herbs, non-prescription drugs, or dietary supplements you use. Also tell them if you smoke, drink alcohol, or use illegal drugs. Some items may interact with your medicine. What should I watch for while using this medicine? Your condition will be monitored carefully while you are receiving this medicine. You may need blood work done while you are taking this medicine. What side effects may I notice from receiving this medicine? Side effects that you should report to your doctor or health care professional as soon as possible: -allergic reactions like skin rash, itching or hives, swelling of the face, lips, or tongue -breathing problems -changes in vision -chest pain -confusion, trouble speaking or understanding -feeling faint or lightheaded, falls -high blood pressure -muscle aches or pains -pain, swelling, warmth in the leg -rapid weight gain -severe headaches -sudden numbness or weakness of the face, arm or leg -trouble walking, dizziness, loss of balance or coordination -seizures (convulsions) -swelling of the ankles, feet, hands -  unusually weak or tired Side effects that usually do not require medical attention (report to your doctor or health care professional if they continue or are bothersome): -diarrhea -fever, chills (flu-like symptoms) -headaches -nausea, vomiting -redness,  stinging, or swelling at site where injected This list may not describe all possible side effects. Call your doctor for medical advice about side effects. You may report side effects to FDA at 1-800-FDA-1088. Where should I keep my medicine? Keep out of the reach of children. Store in a refrigerator between 2 and 8 degrees C (36 and 46 degrees F). Do not freeze. Do not shake. Throw away any unused portion if using a single-dose vial. Throw away any unused medicine after the expiration date. NOTE: This sheet is a summary. It may not cover all possible information. If you have questions about this medicine, talk to your doctor, pharmacist, or health care provider.  2018 Elsevier/Gold Standard (2016-02-13 19:52:26)  Iron-Rich Diet Iron is a mineral that helps your body to produce hemoglobin. Hemoglobin is a protein in your red blood cells that carries oxygen to your body's tissues. Eating too little iron may cause you to feel weak and tired, and it can increase your risk for infection. Eating enough iron is necessary for your body's metabolism, muscle function, and nervous system. Iron is naturally found in many foods. It can also be added to foods or fortified in foods. There are two types of dietary iron:  Heme iron. Heme iron is absorbed by the body more easily than nonheme iron. Heme iron is found in meat, poultry, and fish.  Nonheme iron. Nonheme iron is found in dietary supplements, iron-fortified grains, beans, and vegetables.  You may need to follow an iron-rich diet if:  You have been diagnosed with iron deficiency or iron-deficiency anemia.  You have a condition that prevents you from absorbing dietary iron, such as: ? Infection in your intestines. ? Celiac disease. This involves long-lasting (chronic) inflammation of your intestines.  You do not eat enough iron.  You eat a diet that is high in foods that impair iron absorption.  You have lost a lot of blood.  You have heavy  bleeding during your menstrual cycle.  You are pregnant.  What is my plan? Your health care provider may help you to determine how much iron you need per day based on your condition. Generally, when a person consumes sufficient amounts of iron in the diet, the following iron needs are met:  Men. ? 14-18 years old: 11 mg per day. ? 19-50 years old: 8 mg per day.  Women. ? 14-18 years old: 15 mg per day. ? 19-50 years old: 18 mg per day. ? Over 50 years old: 8 mg per day. ? Pregnant women: 27 mg per day. ? Breastfeeding women: 9 mg per day.  What do I need to know about an iron-rich diet?  Eat fresh fruits and vegetables that are high in vitamin C along with foods that are high in iron. This will help increase the amount of iron that your body absorbs from food, especially with foods containing nonheme iron. Foods that are high in vitamin C include oranges, peppers, tomatoes, and mango.  Take iron supplements only as directed by your health care provider. Overdose of iron can be life-threatening. If you were prescribed iron supplements, take them with orange juice or a vitamin C supplement.  Cook foods in pots and pans that are made from iron.  Eat nonheme iron-containing foods alongside foods   that are high in heme iron. This helps to improve your iron absorption.  Certain foods and drinks contain compounds that impair iron absorption. Avoid eating these foods in the same meal as iron-rich foods or with iron supplements. These include: ? Coffee, black tea, and red wine. ? Milk, dairy products, and foods that are high in calcium. ? Beans, soybeans, and peas. ? Whole grains.  When eating foods that contain both nonheme iron and compounds that impair iron absorption, follow these tips to absorb iron better. ? Soak beans overnight before cooking. ? Soak whole grains overnight and drain them before using. ? Ferment flours before baking, such as using yeast in bread dough. What foods  can I eat? Grains Iron-fortified breakfast cereal. Iron-fortified whole-wheat bread. Enriched rice. Sprouted grains. Vegetables Spinach. Potatoes with skin. Green peas. Broccoli. Red and green bell peppers. Fermented vegetables. Fruits Prunes. Raisins. Oranges. Strawberries. Mango. Grapefruit. Meats and Other Protein Sources Beef liver. Oysters. Beef. Shrimp. [place]. Chicken. [place]. Sardines. Chickpeas. Nuts. Tofu. Beverages Tomato juice. Fresh orange juice. Prune juice. Hibiscus tea. Fortified instant breakfast shakes. Condiments Tahini. Fermented soy sauce. Sweets and Desserts Black-strap molasses. Other Wheat germ. The items listed above may not be a complete list of recommended foods or beverages. Contact your dietitian for more options. What foods are not recommended? Grains Whole grains. Bran cereal. Bran flour. Oats. Vegetables Artichokes. Brussels sprouts. Kale. Fruits Blueberries. Raspberries. Strawberries. Figs. Meats and Other Protein Sources Soybeans. Products made from soy protein. Dairy Milk. Cream. Cheese. Yogurt. Cottage cheese. Beverages Coffee. Black tea. Red wine. Sweets and Desserts Cocoa. Chocolate. Ice cream. Other Basil. Oregano. Parsley. The items listed above may not be a complete list of foods and beverages to avoid. Contact your dietitian for more information. This information is not intended to replace advice given to you by your health care provider. Make sure you discuss any questions you have with your health care provider. Document Released: 02/06/2005 Document Revised: 01/13/2016 Document Reviewed: 01/20/2014 Elsevier Interactive Patient Education  [employer].

## 2017-07-10 ENCOUNTER — Inpatient Hospital Stay: Payer: Medicare Other

## 2017-07-18 ENCOUNTER — Inpatient Hospital Stay: Payer: Medicare Other

## 2017-07-18 ENCOUNTER — Inpatient Hospital Stay: Payer: Medicare Other | Attending: Internal Medicine

## 2017-07-18 ENCOUNTER — Ambulatory Visit (INDEPENDENT_AMBULATORY_CARE_PROVIDER_SITE_OTHER): Payer: Medicare Other | Admitting: Orthopedic Surgery

## 2017-07-18 VITALS — BP 144/82 | HR 56 | Temp 98.0°F | Resp 18

## 2017-07-18 DIAGNOSIS — D638 Anemia in other chronic diseases classified elsewhere: Secondary | ICD-10-CM

## 2017-07-18 DIAGNOSIS — D631 Anemia in chronic kidney disease: Secondary | ICD-10-CM | POA: Diagnosis not present

## 2017-07-18 DIAGNOSIS — I129 Hypertensive chronic kidney disease with stage 1 through stage 4 chronic kidney disease, or unspecified chronic kidney disease: Secondary | ICD-10-CM | POA: Insufficient documentation

## 2017-07-18 DIAGNOSIS — N184 Chronic kidney disease, stage 4 (severe): Secondary | ICD-10-CM

## 2017-07-18 DIAGNOSIS — N183 Chronic kidney disease, stage 3 (moderate): Secondary | ICD-10-CM | POA: Insufficient documentation

## 2017-07-18 DIAGNOSIS — Z79899 Other long term (current) drug therapy: Secondary | ICD-10-CM | POA: Insufficient documentation

## 2017-07-18 LAB — CBC WITH DIFFERENTIAL/PLATELET
BASOS ABS: 0 10*3/uL (ref 0.0–0.1)
Basophils Relative: 1 %
Eosinophils Absolute: 0.2 10*3/uL (ref 0.0–0.5)
Eosinophils Relative: 3 %
HEMATOCRIT: 31.5 % — AB (ref 34.8–46.6)
HEMOGLOBIN: 9.8 g/dL — AB (ref 11.6–15.9)
Lymphocytes Relative: 14 %
Lymphs Abs: 0.7 10*3/uL — ABNORMAL LOW (ref 0.9–3.3)
MCH: 27.5 pg (ref 25.1–34.0)
MCHC: 31 g/dL — ABNORMAL LOW (ref 31.5–36.0)
MCV: 88.8 fL (ref 79.5–101.0)
Monocytes Absolute: 0.3 10*3/uL (ref 0.1–0.9)
Monocytes Relative: 7 %
NEUTROS ABS: 3.9 10*3/uL (ref 1.5–6.5)
Neutrophils Relative %: 75 %
Platelets: 247 10*3/uL (ref 145–400)
RBC: 3.55 MIL/uL — AB (ref 3.70–5.45)
RDW: 18.4 % — ABNORMAL HIGH (ref 11.2–16.1)
WBC: 5.1 10*3/uL (ref 3.9–10.3)

## 2017-07-18 LAB — IRON AND TIBC
Iron: 46 ug/dL (ref 41–142)
SATURATION RATIOS: 25 % (ref 21–57)
TIBC: 180 ug/dL — ABNORMAL LOW (ref 236–444)
UIBC: 134 ug/dL

## 2017-07-18 LAB — FERRITIN: Ferritin: 104 ng/mL (ref 9–269)

## 2017-07-18 MED ORDER — DARBEPOETIN ALFA 300 MCG/0.6ML IJ SOSY
300.0000 ug | PREFILLED_SYRINGE | Freq: Once | INTRAMUSCULAR | Status: AC
  Administered 2017-07-18: 300 ug via SUBCUTANEOUS

## 2017-07-18 NOTE — Patient Instructions (Signed)
Darbepoetin Alfa injection What is this medicine? DARBEPOETIN ALFA (dar be POE e tin AL fa) helps your body make more red blood cells. It is used to treat anemia caused by chronic kidney failure and chemotherapy. This medicine may be used for other purposes; ask your health care provider or pharmacist if you have questions. COMMON BRAND NAME(S): Aranesp What should I tell my health care provider before I take this medicine? They need to know if you have any of these conditions: -blood clotting disorders or history of blood clots -cancer patient not on chemotherapy -cystic fibrosis -heart disease, such as angina, heart failure, or a history of a heart attack -hemoglobin level of 12 g/dL or greater -high blood pressure -low levels of folate, iron, or vitamin B12 -seizures -an unusual or allergic reaction to darbepoetin, erythropoietin, albumin, hamster proteins, latex, other medicines, foods, dyes, or preservatives -pregnant or trying to get pregnant -breast-feeding How should I use this medicine? This medicine is for injection into a vein or under the skin. It is usually given by a health care professional in a hospital or clinic setting. If you get this medicine at home, you will be taught how to prepare and give this medicine. Use exactly as directed. Take your medicine at regular intervals. Do not take your medicine more often than directed. It is important that you put your used needles and syringes in a special sharps container. Do not put them in a trash can. If you do not have a sharps container, call your pharmacist or healthcare provider to get one. A special MedGuide will be given to you by the pharmacist with each prescription and refill. Be sure to read this information carefully each time. Talk to your pediatrician regarding the use of this medicine in children. While this medicine may be used in children as young as 1 year for selected conditions, precautions do  apply. Overdosage: If you think you have taken too much of this medicine contact a poison control center or emergency room at once. NOTE: This medicine is only for you. Do not share this medicine with others. What if I miss a dose? If you miss a dose, take it as soon as you can. If it is almost time for your next dose, take only that dose. Do not take double or extra doses. What may interact with this medicine? Do not take this medicine with any of the following medications: -epoetin alfa This list may not describe all possible interactions. Give your health care provider a list of all the medicines, herbs, non-prescription drugs, or dietary supplements you use. Also tell them if you smoke, drink alcohol, or use illegal drugs. Some items may interact with your medicine. What should I watch for while using this medicine? Your condition will be monitored carefully while you are receiving this medicine. You may need blood work done while you are taking this medicine. What side effects may I notice from receiving this medicine? Side effects that you should report to your doctor or health care professional as soon as possible: -allergic reactions like skin rash, itching or hives, swelling of the face, lips, or tongue -breathing problems -changes in vision -chest pain -confusion, trouble speaking or understanding -feeling faint or lightheaded, falls -high blood pressure -muscle aches or pains -pain, swelling, warmth in the leg -rapid weight gain -severe headaches -sudden numbness or weakness of the face, arm or leg -trouble walking, dizziness, loss of balance or coordination -seizures (convulsions) -swelling of the ankles, feet, hands -  unusually weak or tired Side effects that usually do not require medical attention (report to your doctor or health care professional if they continue or are bothersome): -diarrhea -fever, chills (flu-like symptoms) -headaches -nausea, vomiting -redness,  stinging, or swelling at site where injected This list may not describe all possible side effects. Call your doctor for medical advice about side effects. You may report side effects to FDA at 1-800-FDA-1088. Where should I keep my medicine? Keep out of the reach of children. Store in a refrigerator between 2 and 8 degrees C (36 and 46 degrees F). Do not freeze. Do not shake. Throw away any unused portion if using a single-dose vial. Throw away any unused medicine after the expiration date. NOTE: This sheet is a summary. It may not cover all possible information. If you have questions about this medicine, talk to your doctor, pharmacist, or health care provider.  2018 Elsevier/Gold Standard (2016-02-13 19:52:26)  Iron-Rich Diet Iron is a mineral that helps your body to produce hemoglobin. Hemoglobin is a protein in your red blood cells that carries oxygen to your body's tissues. Eating too little iron may cause you to feel weak and tired, and it can increase your risk for infection. Eating enough iron is necessary for your body's metabolism, muscle function, and nervous system. Iron is naturally found in many foods. It can also be added to foods or fortified in foods. There are two types of dietary iron:  Heme iron. Heme iron is absorbed by the body more easily than nonheme iron. Heme iron is found in meat, poultry, and fish.  Nonheme iron. Nonheme iron is found in dietary supplements, iron-fortified grains, beans, and vegetables.  You may need to follow an iron-rich diet if:  You have been diagnosed with iron deficiency or iron-deficiency anemia.  You have a condition that prevents you from absorbing dietary iron, such as: ? Infection in your intestines. ? Celiac disease. This involves long-lasting (chronic) inflammation of your intestines.  You do not eat enough iron.  You eat a diet that is high in foods that impair iron absorption.  You have lost a lot of blood.  You have heavy  bleeding during your menstrual cycle.  You are pregnant.  What is my plan? Your health care provider may help you to determine how much iron you need per day based on your condition. Generally, when a person consumes sufficient amounts of iron in the diet, the following iron needs are met:  Men. ? 14-18 years old: 11 mg per day. ? 19-50 years old: 8 mg per day.  Women. ? 14-18 years old: 15 mg per day. ? 19-50 years old: 18 mg per day. ? Over 50 years old: 8 mg per day. ? Pregnant women: 27 mg per day. ? Breastfeeding women: 9 mg per day.  What do I need to know about an iron-rich diet?  Eat fresh fruits and vegetables that are high in vitamin C along with foods that are high in iron. This will help increase the amount of iron that your body absorbs from food, especially with foods containing nonheme iron. Foods that are high in vitamin C include oranges, peppers, tomatoes, and mango.  Take iron supplements only as directed by your health care provider. Overdose of iron can be life-threatening. If you were prescribed iron supplements, take them with orange juice or a vitamin C supplement.  Cook foods in pots and pans that are made from iron.  Eat nonheme iron-containing foods alongside foods   that are high in heme iron. This helps to improve your iron absorption.  Certain foods and drinks contain compounds that impair iron absorption. Avoid eating these foods in the same meal as iron-rich foods or with iron supplements. These include: ? Coffee, black tea, and red wine. ? Milk, dairy products, and foods that are high in calcium. ? Beans, soybeans, and peas. ? Whole grains.  When eating foods that contain both nonheme iron and compounds that impair iron absorption, follow these tips to absorb iron better. ? Soak beans overnight before cooking. ? Soak whole grains overnight and drain them before using. ? Ferment flours before baking, such as using yeast in bread dough. What foods  can I eat? Grains Iron-fortified breakfast cereal. Iron-fortified whole-wheat bread. Enriched rice. Sprouted grains. Vegetables Spinach. Potatoes with skin. Green peas. Broccoli. Red and green bell peppers. Fermented vegetables. Fruits Prunes. Raisins. Oranges. Strawberries. Mango. Grapefruit. Meats and Other Protein Sources Beef liver. Oysters. Beef. Shrimp. [place]. Chicken. [place]. Sardines. Chickpeas. Nuts. Tofu. Beverages Tomato juice. Fresh orange juice. Prune juice. Hibiscus tea. Fortified instant breakfast shakes. Condiments Tahini. Fermented soy sauce. Sweets and Desserts Black-strap molasses. Other Wheat germ. The items listed above may not be a complete list of recommended foods or beverages. Contact your dietitian for more options. What foods are not recommended? Grains Whole grains. Bran cereal. Bran flour. Oats. Vegetables Artichokes. Brussels sprouts. Kale. Fruits Blueberries. Raspberries. Strawberries. Figs. Meats and Other Protein Sources Soybeans. Products made from soy protein. Dairy Milk. Cream. Cheese. Yogurt. Cottage cheese. Beverages Coffee. Black tea. Red wine. Sweets and Desserts Cocoa. Chocolate. Ice cream. Other Basil. Oregano. Parsley. The items listed above may not be a complete list of foods and beverages to avoid. Contact your dietitian for more information. This information is not intended to replace advice given to you by your health care provider. Make sure you discuss any questions you have with your health care provider. Document Released: 02/06/2005 Document Revised: 01/13/2016 Document Reviewed: 01/20/2014 Elsevier Interactive Patient Education  [employer].

## 2017-07-18 NOTE — Progress Notes (Deleted)
Norma [name] Date of Birth: 1945-07-18 Medical Record #[id number]  History of Present Illness: Norma [name] is seen for followup CAD and PAD. She has a hsitory of  complex vascular surgery in November of 2012 including aorto-bifemoral bypass for aortic occlusive disease. She had CABG back in June of 2011. She has chronic swelling of her legs from venous insufficiency.  Echo in Feb. 2014  showed a normal EF and moderate PHTN and grade 1 diastolic dysfunction.. She has a history of bradycardia - she remains on a lower dose of her Coreg to 12.5 mg BID.  She has refractory HTN.  She has a history of pulsatile tinnitus.  Carotid dopplers in August 2015 showed no obstructive disease. She underwent CT of the aorta which showed no obstructive disease in the aorta or great vessels. There was no aneurysm. Unfortunately she developed contrast induced nephropathy with increased BUN to 85 and creatinine to 3.57. Her renal function later improved.   In September 2017 she underwent right THR. This was complicated by a wound infection that required debridement and wound VAC as well as antibiotics. She also has significant lumbar arthropathy. In February she was seen by hematology with complaints of fatigue and was severely anemic. Hgb 7.5. She was transfused 2 units PRBCs.   She is followed by hematology for chronic anemia. She sees Dr. Florene Glen for her CKD. She denies any significant chest pain. Did note more dyspnea and fatigue with anemia that has improved post transfusion. Feels occasional sensation in her chest of a grip that then releases. She is op a 5 day course of  Prednisone for left shoulder pain. BP on 08/29/16 was 142/70.   Current Outpatient Medications  Medication Sig Dispense Refill  . allopurinol (ZYLOPRIM) 100 MG tablet Take 100 mg by mouth daily as needed.    Marland Kitchen aspirin EC 325 MG EC tablet Take 1 tablet (325 mg total) by mouth daily with breakfast. 30 tablet 0  . atorvastatin (LIPITOR) 80 MG tablet  Take 1 tablet (80 mg total) by mouth daily. (Patient taking differently: Take 80 mg by mouth every morning. ) 30 tablet 6  . carvedilol (COREG) 12.5 MG tablet Take 1 tablet (12.5 mg total) by mouth 2 (two) times daily. 180 tablet 3  . cholecalciferol (VITAMIN D) 1000 units tablet Take 1,000 Units by mouth daily as needed (supplement).    . cloNIDine (CATAPRES) 0.3 MG tablet Take 0.3 mg by mouth 2 (two) times daily.     . Coenzyme Q10 200 MG capsule Take 200 mg by mouth every morning.     . Cyanocobalamin 500 MCG SUBL Take 1 tablet by mouth daily.    . ferrous sulfate 325 (65 FE) MG tablet Take 325 mg by mouth daily with breakfast.    . ferrous sulfate 325 (65 FE) MG tablet Take 1 tablet by mouth daily.    . furosemide (LASIX) 80 MG tablet Take 80 mg by mouth daily.     Marland Kitchen glucose blood test strip 1.8 each by Other route as needed for other. Use as instructed    . hydrALAZINE (APRESOLINE) 50 MG tablet Take 50 mg by mouth 3 (three) times daily.    Marland Kitchen HYDROcodone-acetaminophen (NORCO/VICODIN) 5-325 MG tablet Take 1 tablet by mouth every 6 (six) hours as needed for moderate pain. 60 tablet 0  . insulin glargine (LANTUS) 100 UNIT/ML injection Inject 0.2 mLs (20 Units total) into the skin 2 (two) times daily. 10 mL 11  . isosorbide dinitrate (ISORDIL)  20 MG tablet Take 40 mg by mouth 2 (two) times daily.     . Liraglutide (VICTOZA [place]) Inject 1.8 mg into the skin daily.    Marland Kitchen liraglutide (VICTOZA) 18 MG/3ML SOPN Inject 1.8 mg into the skin every morning.    Marland Kitchen losartan (COZAAR) 100 MG tablet Take 100 mg by mouth daily.    . Magnesium 250 MG TABS Take 250 mg by mouth every morning.     Marland Kitchen MAVYRET 100-40 MG TABS   2  . OVER THE COUNTER MEDICATION Over the counter laxative from [facility]    . predniSONE (DELTASONE) 10 MG tablet Take 30 mg by mouth daily with breakfast.    . Probiotic Product (ACIDOPHILUS/BIFIDUS PO) Take 1 tablet by mouth daily.    . Probiotic Product (PROBIOTIC DAILY PO) Take 1 tablet by mouth  daily as needed (supplement).     . vitamin B-12 (CYANOCOBALAMIN) 500 MCG tablet Take 500 mcg by mouth every morning.      No current facility-administered medications for this visit.     Allergies  Allergen Reactions  . Iohexol Itching and Rash     Code: RASH, Desc: pt called 1 day post scanning stating that skin was red and "itching all over" some what better but still had symptom.. instructed pt to take benadryl to relieve symptoms,per dr Alvester Chou.if any problems call back/mms, Onset Date: [id number]   . Sulfa Antibiotics Other (See Comments)    UNSPECIFIED REACTION     Past Medical History:  Diagnosis Date  . Anxiety   . Carotid artery occlusion   . CKD (chronic kidney disease) stage 3, GFR 30-59 ml/min (HCC) 04/18/2014  . Claudication ([place])   . Constipation    Due to patient taking iron supplements  . Coronary artery disease   . DDD (degenerative disc disease)   . Diabetes mellitus   . Diabetic coma ([place])   . DJD (degenerative joint disease)   . DJD (degenerative joint disease)   . Edema    Bilateral lower extermities  . Gout   . Hyperlipidemia   . Hypertension   . Iron deficiency anemia   . Leg pain   . Obesity   . PAD (peripheral artery disease) ([place])   . Shortness of breath    exertion    Past Surgical History:  Procedure Laterality Date  . AORTA - BILATERAL FEMORAL ARTERY BYPASS GRAFT  05/25/2011   Procedure: AORTA BIFEMORAL BYPASS GRAFT;  Surgeon: Mal Misty, MD;  Location: [facility];  Service: Vascular;  Laterality: N/A;  . APPLICATION OF WOUND VAC Right 04/23/2016   Procedure: APPLICATION OF WOUND VAC CHANGED;  Surgeon: Meredith Pel, MD;  Location: [facility];  Service: Orthopedics;  Laterality: Right;  . BREAST EXCISIONAL BIOPSY Right   . BREAST SURGERY    . CARDIAC CATHETERIZATION  12/01/2009   EF 65%  . CARDIOVASCULAR STRESS TEST  11/28/2009   EF 75%  . COLONOSCOPY W/ POLYPECTOMY    . CORONARY ARTERY BYPASS GRAFT  12/08/2009   LIMA GRAFT TO THE DISTAL  LAD AND SAPHENOUS VEIN GRAFT TO THE OBTUSE MARGINAL VESSEL  . EYE SURGERY    . I&D EXTREMITY Right 04/23/2016   Procedure: IRRIGATION AND DEBRIDEMENT EXTREMITY;  Surgeon: Meredith Pel, MD;  Location: [facility];  Service: Orthopedics;  Laterality: Right;  . INCISION AND DRAINAGE HIP Right 04/20/2016   Procedure: IRRIGATION AND DEBRIDEMENT HIP WITH POLY EXCHANGE, ABX BEAD PLACEMENT, WOUND VAC ;  Surgeon: Meredith Pel, MD;  Location: [facility];  Service: Orthopedics;  Laterality: Right;  RIGHT HIP SUPERFICIAL I&D, POSSIBLE DEEP I&D, LINER EXCHANGE, ABX BEAD PLACEMENT, WOUND VAC.   Marland Kitchen PR VEIN BYPASS GRAFT,AORTO-FEM-POP  05/25/11  . REMOVAL OF FIBROUS CYST FROM RIGHT BREAST  10+ YEARS  . RETINAL DETACHMENT SURGERY  10+ YEARS   LEFT EYE  . TOTAL HIP ARTHROPLASTY Right 03/06/2016  . TOTAL HIP ARTHROPLASTY Right 03/06/2016   Procedure: RIGHT TOTAL HIP ARTHROPLASTY ANTERIOR APPROACH;  Surgeon: Meredith Pel, MD;  Location: [facility];  Service: Orthopedics;  Laterality: Right;  . TRANSTHORACIC ECHOCARDIOGRAM  12/01/2009   EF 60-65%    Social History   Tobacco Use  Smoking Status Former Smoker  . Years: 20.00  . Types: Cigarettes  . Last attempt to quit: 07/10/1991  . Years since quitting: 26.0  Smokeless Tobacco Never Used    Social History   Substance and Sexual Activity  Alcohol Use No    Family History  Problem Relation Age of Onset  . Hypertension Mother   . Coronary artery disease Father   . Hypertension Sister   . Cirrhosis Brother   . Kidney disease Daughter     Review of Systems: The review of systems is per the HPI.   All other systems were reviewed and are negative.  Physical Exam: There were no vitals taken for this visit. Patient is very pleasant and in no acute distress. She is in a wheelchair. She is obese. Skin is warm and dry. Color is normal.  HEENT is unremarkable. Normocephalic/atraumatic. PERRL. Sclera are nonicteric. Neck is supple. No masses. No JVD. There  are bilateral loud bruits R>L at the base of the neck and subclavian areas. Lungs are clear. Cardiac exam shows a regular rate and rhythm. Normal S1-2, no gallop or cardiac murmur. Abdomen is soft. NT, BS +. No masses. Extremities have 1+  Edema- compression hose on. Gait and ROM are intact. No gross neurologic deficits noted.  LABORATORY DATA:   Lab Results  Component Value Date   WBC 5.2 06/28/2017   HGB 9.7 (L) 06/28/2017   HCT 31.8 (L) 06/28/2017   PLT 229 06/28/2017   GLUCOSE 143 (H) 06/28/2017   CHOL 138 04/30/2014   TRIG 169 (H) 04/30/2014   HDL 29 (L) 04/30/2014   LDLCALC 75 04/30/2014   ALT <6 06/28/2017   AST 11 06/28/2017   NA 143 06/28/2017   K 4.8 06/28/2017   CL 113 (H) 04/24/2016   CREATININE 2.9 (H) 06/28/2017   BUN 48.6 (H) 06/28/2017   CO2 22 06/28/2017   TSH 2.193 04/16/2014   INR 1.09 01/18/2015   HGBA1C 6.9 (H) 02/27/2016   Labs dated 08/15/16: A1c 8.1%. Cholesterol 181, triglycerides 93, HDL 62, LDL 100.   Ecg today shows NSR with ST- T changes c/w inferior ischemia- unchanged from prior. I have personally reviewed and interpreted this study.   Assessment / Plan: 1. HTN - Blood pressure has been difficult to control - on multiple medications. Continue current therapy.   I have made no changes today.   2. CAD - with past CABG - asymptomatic.   3. PVD - with past aortobifem bypass in 2012 - by Dr. Kellie Simmering  4. Palpitations. Event monitor was unremarkable except for benign PACs.   5. Severe aortic atherosclerosis. No obstructive disease or aneurysm. Her pulsatile tinnitus is related to radiated bruits from her vascular disease.   6. Anemia of chronic disease. Followed by hematology. On aranesp and iron supplementation.  7. 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 Avoid contrast studies in the future unless absolutely necessary.  8. Severe osteoarthritis of right hip. S/p right THR.   I will follow up in 6 months.

## 2017-07-23 ENCOUNTER — Ambulatory Visit: Payer: Medicare Other | Admitting: Cardiology

## 2017-07-25 ENCOUNTER — Encounter (INDEPENDENT_AMBULATORY_CARE_PROVIDER_SITE_OTHER): Payer: Self-pay | Admitting: Orthopedic Surgery

## 2017-07-25 ENCOUNTER — Ambulatory Visit (INDEPENDENT_AMBULATORY_CARE_PROVIDER_SITE_OTHER): Payer: Medicare Other | Admitting: Orthopedic Surgery

## 2017-07-25 DIAGNOSIS — Z96659 Presence of unspecified artificial knee joint: Secondary | ICD-10-CM

## 2017-07-25 DIAGNOSIS — T8459XD Infection and inflammatory reaction due to other internal joint prosthesis, subsequent encounter: Secondary | ICD-10-CM | POA: Diagnosis not present

## 2017-07-27 NOTE — Progress Notes (Signed)
Office Visit Note   Patient: Norma [name]           Date of Birth: 1946-03-24           MRN: [id number] Visit Date: 07/25/2017 Requested by: Bartholome Bill, [place],  [id number] PCP: Bartholome Bill, MD  Subjective: Chief Complaint  Patient presents with  . Right Hip - Follow-up    HPI: Rosie to is a patient who is now over a year out from right hip I&D.  He had right hip replacement 04/20/2016.  She is doing well with her right hip and has no issues.  He ambulates with a cane.  She had some gout last week in the left leg and is currently taking allopurinol.  She has some anemia and was also diagnosed with hepatitis C since I last seen her.              ROS: All systems reviewed are negative as they relate to the chief complaint within the history of present illness.  Patient denies  fevers or chills.   Assessment & Plan: Visit Diagnoses:  1. Infected prosthetic knee joint, subsequent encounter     Plan: Impression is well-functioning right hip replacement.  No issues.  Follow-up as needed.  No evidence of recurrent infection.  No pain.  Follow-Up Instructions: Return if symptoms worsen or fail to improve.   Orders:  No orders of the defined types were placed in this encounter.  No orders of the defined types were placed in this encounter.     Procedures: No procedures performed   Clinical Data: No additional findings.  Objective: Vital Signs: There were no vitals taken for this visit.  Physical Exam:   Constitutional: Patient appears well-developed HEENT:  Head: Normocephalic Eyes:EOM are normal Neck: Normal range of motion Cardiovascular: Normal rate Pulmonary/chest: Effort normal Neurologic: Patient is alert Skin: Skin is warm Psychiatric: Patient has normal mood and affect    Ortho Exam: Orthopedic exam demonstrates no pain with right hip range of motion.  Incision is intact.  Ankle dorsiflexion  plantarflexion is intact.  No other masses lymphadenopathy or skin changes noted in the right hip region.  Specialty Comments:  No specialty comments available.  Imaging: No results found.   PMFS History: Patient Active Problem List   Diagnosis Date Noted  . Anemia of chronic disease 08/27/2016  . Presence of right artificial knee joint 04/30/2016  . Postoperative wound infection of right hip 04/24/2016  . Prosthetic hip infection ([place]) 04/20/2016  . Degenerative arthritis of hip 03/06/2016  . Gout of left knee 10/31/2015  . Right hip pain 10/29/2015  . Acute pain of left knee 10/29/2015  . DM type 2 causing CKD stage 3 ([place]) 10/29/2015  . Morbid obesity ([place]) 10/29/2015  . CKD (chronic kidney disease) stage 4, GFR 15-29 ml/min (HCC) 04/18/2014  . Aortic atherosclerosis ([place]) 04/18/2014  . Bilateral carotid bruits 04/16/2014  . Pulsatile tinnitus of right ear 04/16/2014  . Diarrhea 09/18/2011  . Peripheral vascular disease, unspecified ([place]) 08/14/2011  . Claudication ([place]) 08/14/2011  . Edema 06/29/2011  . Leucocytosis 06/12/2011  . Acute on chronic kidney disease, stage 4 05/28/2011  . Respiratory failure, acute ([place]) 05/25/2011    Class: Acute  . S/P aorto-bifemoral bypass surgery 05/25/2011    Class: Acute  . PAD (peripheral artery disease) ([place])   . Coronary artery disease   . Hyperlipidemia   . Hypertension  Past Medical History:  Diagnosis Date  . Anxiety   . Carotid artery occlusion   . CKD (chronic kidney disease) stage 3, GFR 30-59 ml/min (HCC) 04/18/2014  . Claudication ([place])   . Constipation    Due to patient taking iron supplements  . Coronary artery disease   . DDD (degenerative disc disease)   . Diabetes mellitus   . Diabetic coma ([place])   . DJD (degenerative joint disease)   . DJD (degenerative joint disease)   . Edema    Bilateral lower extermities  . Gout   . Hyperlipidemia   . Hypertension   . Iron deficiency anemia   . Leg pain   .  Obesity   . PAD (peripheral artery disease) ([place])   . Shortness of breath    exertion    Family History  Problem Relation Age of Onset  . Hypertension Mother   . Coronary artery disease Father   . Hypertension Sister   . Cirrhosis Brother   . Kidney disease Daughter     Past Surgical History:  Procedure Laterality Date  . AORTA - BILATERAL FEMORAL ARTERY BYPASS GRAFT  05/25/2011   Procedure: AORTA BIFEMORAL BYPASS GRAFT;  Surgeon: Mal Misty, MD;  Location: [facility];  Service: Vascular;  Laterality: N/A;  . APPLICATION OF WOUND VAC Right 04/23/2016   Procedure: APPLICATION OF WOUND VAC CHANGED;  Surgeon: Meredith Pel, MD;  Location: [facility];  Service: Orthopedics;  Laterality: Right;  . BREAST EXCISIONAL BIOPSY Right   . BREAST SURGERY    . CARDIAC CATHETERIZATION  12/01/2009   EF 65%  . CARDIOVASCULAR STRESS TEST  11/28/2009   EF 75%  . COLONOSCOPY W/ POLYPECTOMY    . CORONARY ARTERY BYPASS GRAFT  12/08/2009   LIMA GRAFT TO THE DISTAL LAD AND SAPHENOUS VEIN GRAFT TO THE OBTUSE MARGINAL VESSEL  . EYE SURGERY    . I&D EXTREMITY Right 04/23/2016   Procedure: IRRIGATION AND DEBRIDEMENT EXTREMITY;  Surgeon: Meredith Pel, MD;  Location: [facility];  Service: Orthopedics;  Laterality: Right;  . INCISION AND DRAINAGE HIP Right 04/20/2016   Procedure: IRRIGATION AND DEBRIDEMENT HIP WITH POLY EXCHANGE, ABX BEAD PLACEMENT, WOUND VAC ;  Surgeon: Meredith Pel, MD;  Location: [facility];  Service: Orthopedics;  Laterality: Right;  RIGHT HIP SUPERFICIAL I&D, POSSIBLE DEEP I&D, LINER EXCHANGE, ABX BEAD PLACEMENT, WOUND VAC.   Marland Kitchen PR VEIN BYPASS GRAFT,AORTO-FEM-POP  05/25/11  . REMOVAL OF FIBROUS CYST FROM RIGHT BREAST  10+ YEARS  . RETINAL DETACHMENT SURGERY  10+ YEARS   LEFT EYE  . TOTAL HIP ARTHROPLASTY Right 03/06/2016  . TOTAL HIP ARTHROPLASTY Right 03/06/2016   Procedure: RIGHT TOTAL HIP ARTHROPLASTY ANTERIOR APPROACH;  Surgeon: Meredith Pel, MD;  Location: [facility];  Service:  Orthopedics;  Laterality: Right;  . TRANSTHORACIC ECHOCARDIOGRAM  12/01/2009   EF 60-65%   Social History   Occupational History  . Occupation: [employer]    Employer: RETIRED    Comment: retired  Tobacco Use  . Smoking status: Former Smoker    Years: 20.00    Types: Cigarettes    Last attempt to quit: 07/10/1991    Years since quitting: 26.0  . Smokeless tobacco: Never Used  Substance and Sexual Activity  . Alcohol use: No  . Drug use: No  . Sexual activity: No

## 2017-07-30 ENCOUNTER — Inpatient Hospital Stay: Payer: Medicare Other

## 2017-07-30 ENCOUNTER — Ambulatory Visit: Payer: Medicare Other

## 2017-07-31 ENCOUNTER — Inpatient Hospital Stay: Payer: Medicare Other

## 2017-08-07 ENCOUNTER — Other Ambulatory Visit: Payer: Self-pay | Admitting: Medical Oncology

## 2017-08-08 ENCOUNTER — Inpatient Hospital Stay: Payer: Medicare Other

## 2017-08-08 ENCOUNTER — Other Ambulatory Visit: Payer: Self-pay | Admitting: Medical Oncology

## 2017-08-08 VITALS — BP 172/60

## 2017-08-08 DIAGNOSIS — N184 Chronic kidney disease, stage 4 (severe): Secondary | ICD-10-CM

## 2017-08-08 DIAGNOSIS — I129 Hypertensive chronic kidney disease with stage 1 through stage 4 chronic kidney disease, or unspecified chronic kidney disease: Secondary | ICD-10-CM | POA: Diagnosis not present

## 2017-08-08 DIAGNOSIS — D72829 Elevated white blood cell count, unspecified: Secondary | ICD-10-CM

## 2017-08-08 DIAGNOSIS — D638 Anemia in other chronic diseases classified elsewhere: Secondary | ICD-10-CM

## 2017-08-08 LAB — CMP (CANCER CENTER ONLY)
ALT: 4 U/L (ref 0–55)
ANION GAP: 8 (ref 3–11)
AST: 11 U/L (ref 5–34)
Albumin: 2.2 g/dL — ABNORMAL LOW (ref 3.5–5.0)
Alkaline Phosphatase: 75 U/L (ref 40–150)
BUN: 68 mg/dL — ABNORMAL HIGH (ref 7–26)
CHLORIDE: 114 mmol/L — AB (ref 98–109)
CO2: 21 mmol/L — ABNORMAL LOW (ref 22–29)
Calcium: 8.4 mg/dL (ref 8.4–10.4)
Creatinine: 3.31 mg/dL (ref 0.60–1.10)
GFR, EST AFRICAN AMERICAN: 15 mL/min — AB (ref >60)
GFR, EST NON AFRICAN AMERICAN: 13 mL/min — AB (ref >60)
Glucose, Bld: 196 mg/dL — ABNORMAL HIGH (ref 70–140)
POTASSIUM: 4.5 mmol/L (ref 3.5–5.1)
Sodium: 143 mmol/L (ref 136–145)
TOTAL PROTEIN: 5.8 g/dL — AB (ref 6.4–8.3)
Total Bilirubin: 0.4 mg/dL (ref 0.2–1.2)

## 2017-08-08 LAB — CBC WITH DIFFERENTIAL (CANCER CENTER ONLY)
BASOS ABS: 0 10*3/uL (ref 0.0–0.1)
Basophils Relative: 0 %
EOS PCT: 2 %
Eosinophils Absolute: 0.1 10*3/uL (ref 0.0–0.5)
HEMATOCRIT: 33.2 % — AB (ref 34.8–46.6)
HEMOGLOBIN: 10 g/dL — AB (ref 11.6–15.9)
LYMPHS PCT: 10 %
Lymphs Abs: 0.5 10*3/uL — ABNORMAL LOW (ref 0.9–3.3)
MCH: 27 pg (ref 25.1–34.0)
MCHC: 30.1 g/dL — ABNORMAL LOW (ref 31.5–36.0)
MCV: 89.5 fL (ref 79.5–101.0)
Monocytes Absolute: 0.4 10*3/uL (ref 0.1–0.9)
Monocytes Relative: 8 %
NEUTROS ABS: 4 10*3/uL (ref 1.5–6.5)
NEUTROS PCT: 80 %
PLATELETS: 239 10*3/uL (ref 145–400)
RBC: 3.71 MIL/uL (ref 3.70–5.45)
RDW: 17.5 % — ABNORMAL HIGH (ref 11.2–14.5)
WBC: 5 10*3/uL (ref 3.9–10.3)

## 2017-08-08 MED ORDER — DARBEPOETIN ALFA 300 MCG/0.6ML IJ SOSY
300.0000 ug | PREFILLED_SYRINGE | Freq: Once | INTRAMUSCULAR | Status: AC
  Administered 2017-08-08: 300 ug via SUBCUTANEOUS

## 2017-08-21 ENCOUNTER — Inpatient Hospital Stay: Payer: Medicare Other

## 2017-08-28 ENCOUNTER — Ambulatory Visit: Payer: Medicare Other | Admitting: Internal Medicine

## 2017-08-29 ENCOUNTER — Telehealth: Payer: Self-pay | Admitting: Internal Medicine

## 2017-08-29 ENCOUNTER — Inpatient Hospital Stay: Payer: Medicare Other

## 2017-08-29 ENCOUNTER — Inpatient Hospital Stay: Payer: Medicare Other | Attending: Internal Medicine | Admitting: Internal Medicine

## 2017-08-29 ENCOUNTER — Encounter: Payer: Self-pay | Admitting: Internal Medicine

## 2017-08-29 VITALS — BP 160/68 | HR 52 | Temp 98.4°F | Resp 24 | Ht 62.0 in | Wt 200.6 lb

## 2017-08-29 DIAGNOSIS — N189 Chronic kidney disease, unspecified: Secondary | ICD-10-CM

## 2017-08-29 DIAGNOSIS — E611 Iron deficiency: Secondary | ICD-10-CM | POA: Diagnosis not present

## 2017-08-29 DIAGNOSIS — D638 Anemia in other chronic diseases classified elsewhere: Secondary | ICD-10-CM

## 2017-08-29 DIAGNOSIS — D631 Anemia in chronic kidney disease: Secondary | ICD-10-CM | POA: Diagnosis not present

## 2017-08-29 DIAGNOSIS — I129 Hypertensive chronic kidney disease with stage 1 through stage 4 chronic kidney disease, or unspecified chronic kidney disease: Secondary | ICD-10-CM

## 2017-08-29 DIAGNOSIS — N184 Chronic kidney disease, stage 4 (severe): Secondary | ICD-10-CM

## 2017-08-29 LAB — CBC WITH DIFFERENTIAL/PLATELET
BASOS ABS: 0.1 10*3/uL (ref 0.0–0.1)
Basophils Relative: 1 %
EOS ABS: 0.1 10*3/uL (ref 0.0–0.5)
EOS PCT: 3 %
HCT: 33.1 % — ABNORMAL LOW (ref 34.8–46.6)
Hemoglobin: 10.1 g/dL — ABNORMAL LOW (ref 11.6–15.9)
Lymphocytes Relative: 10 %
Lymphs Abs: 0.5 10*3/uL — ABNORMAL LOW (ref 0.9–3.3)
MCH: 26.9 pg (ref 25.1–34.0)
MCHC: 30.5 g/dL — ABNORMAL LOW (ref 31.5–36.0)
MCV: 88.1 fL (ref 79.5–101.0)
Monocytes Absolute: 0.3 10*3/uL (ref 0.1–0.9)
Monocytes Relative: 7 %
Neutro Abs: 3.5 10*3/uL (ref 1.5–6.5)
Neutrophils Relative %: 79 %
PLATELETS: 214 10*3/uL (ref 145–400)
RBC: 3.75 MIL/uL (ref 3.70–5.45)
RDW: 19.3 % — ABNORMAL HIGH (ref 11.2–14.5)
WBC: 4.4 10*3/uL (ref 3.9–10.3)

## 2017-08-29 MED ORDER — DARBEPOETIN ALFA 300 MCG/0.6ML IJ SOSY
300.0000 ug | PREFILLED_SYRINGE | Freq: Once | INTRAMUSCULAR | Status: AC
  Administered 2017-08-29: 300 ug via SUBCUTANEOUS

## 2017-08-29 NOTE — Telephone Encounter (Signed)
Scheduled appt per 2/21 los - left message with appt date and time.

## 2017-08-29 NOTE — Progress Notes (Signed)
[place] [phone]   [phone]  OFFICE PROGRESS NOTE  Bartholome Bill, MD [place] [id number]  DIAGNOSIS: Anemia of chronic disease secondary to chronic renal insufficiency.  PRIOR THERAPY: None  CURRENT THERAPY: Aranesp 300 g subcutaneously every 3 weeks. First dose 03/06/2017. Over-the-counter ferrous sulfate.  INTERVAL HISTORY: Norma [name] 72 y.o. female returns to the clinic today for follow-up visit.  The patient is feeling fine today with no specific complaints except for mild fatigue.  She noticed improvement in her condition with the current Aranesp injection and also after increasing the oral iron tablet to twice daily.  She denied having any bleeding issues.  She has no chest pain, shortness breath, cough or hemoptysis.  She denied having any fever or chills.  She has no nausea, vomiting, diarrhea or constipation.  She is here today for evaluation with repeat CBC.  MEDICAL HISTORY: Past Medical History:  Diagnosis Date  . Anxiety   . Carotid artery occlusion   . CKD (chronic kidney disease) stage 3, GFR 30-59 ml/min (HCC) 04/18/2014  . Claudication ([place])   . Constipation    Due to patient taking iron supplements  . Coronary artery disease   . DDD (degenerative disc disease)   . Diabetes mellitus   . Diabetic coma ([place])   . DJD (degenerative joint disease)   . DJD (degenerative joint disease)   . Edema    Bilateral lower extermities  . Gout   . Hyperlipidemia   . Hypertension   . Iron deficiency anemia   . Leg pain   . Obesity   . PAD (peripheral artery disease) ([place])   . Shortness of breath    exertion    ALLERGIES:  is allergic to iohexol and sulfa antibiotics.  MEDICATIONS:  Current Outpatient Medications  Medication Sig Dispense Refill  . allopurinol (ZYLOPRIM) 100 MG tablet Take 100 mg by mouth daily as needed.    Marland Kitchen aspirin EC 325 MG EC tablet Take 1 tablet (325  mg total) by mouth daily with breakfast. 30 tablet 0  . atorvastatin (LIPITOR) 80 MG tablet Take 1 tablet (80 mg total) by mouth daily. (Patient taking differently: Take 80 mg by mouth every morning. ) 30 tablet 6  . carvedilol (COREG) 12.5 MG tablet Take 1 tablet (12.5 mg total) by mouth 2 (two) times daily. 180 tablet 3  . cholecalciferol (VITAMIN D) 1000 units tablet Take 1,000 Units by mouth daily as needed (supplement).    . cloNIDine (CATAPRES) 0.3 MG tablet Take 0.3 mg by mouth 2 (two) times daily.     . Coenzyme Q10 200 MG capsule Take 200 mg by mouth every morning.     . Cyanocobalamin 500 MCG SUBL Take 1 tablet by mouth daily.    . ferrous sulfate 325 (65 FE) MG tablet Take 325 mg by mouth daily with breakfast.    . ferrous sulfate 325 (65 FE) MG tablet Take 1 tablet by mouth daily.    . furosemide (LASIX) 80 MG tablet Take 80 mg by mouth daily.     Marland Kitchen glucose blood test strip 1.8 each by Other route as needed for other. Use as instructed    . hydrALAZINE (APRESOLINE) 50 MG tablet Take 50 mg by mouth 3 (three) times daily.    Marland Kitchen HYDROcodone-acetaminophen (NORCO/VICODIN) 5-325 MG tablet Take 1 tablet by mouth every 6 (six) hours as needed for moderate pain. [place]  tablet 0  . insulin glargine (LANTUS) 100 UNIT/ML injection Inject 0.2 mLs (20 Units total) into the skin 2 (two) times daily. 10 mL 11  . isosorbide dinitrate (ISORDIL) 20 MG tablet Take 40 mg by mouth 2 (two) times daily.     . Liraglutide (VICTOZA [place]) Inject 1.8 mg into the skin daily.    Marland Kitchen liraglutide (VICTOZA) 18 MG/3ML SOPN Inject 1.8 mg into the skin every morning.    Marland Kitchen losartan (COZAAR) 100 MG tablet Take 100 mg by mouth daily.    . Magnesium 250 MG TABS Take 250 mg by mouth every morning.     Marland Kitchen MAVYRET 100-40 MG TABS   2  . OVER THE COUNTER MEDICATION Over the counter laxative from [facility]    . predniSONE (DELTASONE) 10 MG tablet Take 30 mg by mouth daily with breakfast.    . Probiotic Product (ACIDOPHILUS/BIFIDUS PO) Take  1 tablet by mouth daily.    . Probiotic Product (PROBIOTIC DAILY PO) Take 1 tablet by mouth daily as needed (supplement).     . vitamin B-12 (CYANOCOBALAMIN) 500 MCG tablet Take 500 mcg by mouth every morning.      No current facility-administered medications for this visit.     SURGICAL HISTORY:  Past Surgical History:  Procedure Laterality Date  . AORTA - BILATERAL FEMORAL ARTERY BYPASS GRAFT  05/25/2011   Procedure: AORTA BIFEMORAL BYPASS GRAFT;  Surgeon: Mal Misty, MD;  Location: [facility];  Service: Vascular;  Laterality: N/A;  . APPLICATION OF WOUND VAC Right 04/23/2016   Procedure: APPLICATION OF WOUND VAC CHANGED;  Surgeon: Meredith Pel, MD;  Location: [facility];  Service: Orthopedics;  Laterality: Right;  . BREAST EXCISIONAL BIOPSY Right   . BREAST SURGERY    . CARDIAC CATHETERIZATION  12/01/2009   EF 65%  . CARDIOVASCULAR STRESS TEST  11/28/2009   EF 75%  . COLONOSCOPY W/ POLYPECTOMY    . CORONARY ARTERY BYPASS GRAFT  12/08/2009   LIMA GRAFT TO THE DISTAL LAD AND SAPHENOUS VEIN GRAFT TO THE OBTUSE MARGINAL VESSEL  . EYE SURGERY    . I&D EXTREMITY Right 04/23/2016   Procedure: IRRIGATION AND DEBRIDEMENT EXTREMITY;  Surgeon: Meredith Pel, MD;  Location: [facility];  Service: Orthopedics;  Laterality: Right;  . INCISION AND DRAINAGE HIP Right 04/20/2016   Procedure: IRRIGATION AND DEBRIDEMENT HIP WITH POLY EXCHANGE, ABX BEAD PLACEMENT, WOUND VAC ;  Surgeon: Meredith Pel, MD;  Location: [facility];  Service: Orthopedics;  Laterality: Right;  RIGHT HIP SUPERFICIAL I&D, POSSIBLE DEEP I&D, LINER EXCHANGE, ABX BEAD PLACEMENT, WOUND VAC.   Marland Kitchen PR VEIN BYPASS GRAFT,AORTO-FEM-POP  05/25/11  . REMOVAL OF FIBROUS CYST FROM RIGHT BREAST  10+ YEARS  . RETINAL DETACHMENT SURGERY  10+ YEARS   LEFT EYE  . TOTAL HIP ARTHROPLASTY Right 03/06/2016  . TOTAL HIP ARTHROPLASTY Right 03/06/2016   Procedure: RIGHT TOTAL HIP ARTHROPLASTY ANTERIOR APPROACH;  Surgeon: Meredith Pel, MD;  Location:  [facility];  Service: Orthopedics;  Laterality: Right;  . TRANSTHORACIC ECHOCARDIOGRAM  12/01/2009   EF 60-65%    REVIEW OF SYSTEMS:  A comprehensive review of systems was negative except for: Constitutional: positive for fatigue   PHYSICAL EXAMINATION: General appearance: alert, cooperative, fatigued and no distress Head: Normocephalic, without obvious abnormality, atraumatic Neck: no adenopathy, no JVD, supple, symmetrical, trachea midline and thyroid not enlarged, symmetric, no tenderness/mass/nodules Lymph nodes: Cervical, supraclavicular, and axillary nodes normal. Resp: clear to auscultation bilaterally Back: symmetric, no curvature. ROM  normal. No CVA tenderness. Cardio: regular rate and rhythm, S1, S2 normal, no murmur, click, rub or gallop GI: soft, non-tender; bowel sounds normal; no masses,  no organomegaly Extremities: extremities normal, atraumatic, no cyanosis or edema  ECOG PERFORMANCE STATUS: 1 - Symptomatic but completely ambulatory  Blood pressure (!) 160/68, pulse (!) 52, temperature 98.4 F (36.9 C), temperature source Oral, resp. rate (!) 24, height 5\' 2"  (1.575 m), weight 200 lb 9.6 oz (91 kg), SpO2 98 %.  LABORATORY DATA: Lab Results  Component Value Date   WBC 4.4 08/29/2017   HGB 10.1 (L) 08/29/2017   HCT 33.1 (L) 08/29/2017   MCV 88.1 08/29/2017   PLT 214 08/29/2017      Chemistry      Component Value Date/Time   NA 143 08/08/2017 1113   NA 143 06/28/2017 1239   K 4.5 08/08/2017 1113   K 4.8 06/28/2017 1239   CL 114 (H) 08/08/2017 1113   CO2 21 (L) 08/08/2017 1113   CO2 22 06/28/2017 1239   BUN 68 (H) 08/08/2017 1113   BUN 48.6 (H) 06/28/2017 1239   CREATININE 3.31 (HH) 08/08/2017 1113   CREATININE 2.9 (H) 06/28/2017 1239      Component Value Date/Time   CALCIUM 8.4 08/08/2017 1113   CALCIUM 8.3 (L) 06/28/2017 1239   ALKPHOS 75 08/08/2017 1113   ALKPHOS 86 06/28/2017 1239   AST 11 08/08/2017 1113   AST 11 06/28/2017 1239   ALT 4 08/08/2017  1113   ALT <6 06/28/2017 1239   BILITOT 0.4 08/08/2017 1113   BILITOT 0.28 06/28/2017 1239       RADIOGRAPHIC STUDIES: No results found.  ASSESSMENT AND PLAN:  This is a very pleasant 72 years old African-American female with anemia of chronic disease secondary to renal insufficiency plus/minus iron deficiency. The patient is currently on treatment with Aranesp 300 mcg every 3 weeks in addition to over-the-counter oral iron tablets.  CBC today showed a stable hemoglobin and hematocrit.  I recommended for the patient to continue her current treatment with Aranesp and the over-the-counter oral iron tablets twice daily. For hypertension, strongly recommended for the patient to take her blood pressure medication as prescribed and to discuss with her primary care physician for adjustment of her medication if needed. I will see her back for follow-up visit in 3 months for evaluation after repeating CBC, iron study and ferritin. She was advised to call immediately if she has any concerning symptoms in the interval. The patient voices understanding of current disease status and treatment options and is in agreement with the current care plan. All questions were answered. The patient knows to call the clinic with any problems, questions or concerns. We can certainly see the patient much sooner if necessary.  Disclaimer: This note was dictated with voice recognition software. Similar sounding words can inadvertently be transcribed and may not be corrected upon review.

## 2017-08-29 NOTE — Patient Instructions (Signed)
Darbepoetin Alfa injection What is this medicine? DARBEPOETIN ALFA (dar be POE e tin AL fa) helps your body make more red blood cells. It is used to treat anemia caused by chronic kidney failure and chemotherapy. This medicine may be used for other purposes; ask your health care provider or pharmacist if you have questions. COMMON BRAND NAME(S): Aranesp What should I tell my health care provider before I take this medicine? They need to know if you have any of these conditions: -blood clotting disorders or history of blood clots -cancer patient not on chemotherapy -cystic fibrosis -heart disease, such as angina, heart failure, or a history of a heart attack -hemoglobin level of 12 g/dL or greater -high blood pressure -low levels of folate, iron, or vitamin B12 -seizures -an unusual or allergic reaction to darbepoetin, erythropoietin, albumin, hamster proteins, latex, other medicines, foods, dyes, or preservatives -pregnant or trying to get pregnant -breast-feeding How should I use this medicine? This medicine is for injection into a vein or under the skin. It is usually given by a health care professional in a hospital or clinic setting. If you get this medicine at home, you will be taught how to prepare and give this medicine. Use exactly as directed. Take your medicine at regular intervals. Do not take your medicine more often than directed. It is important that you put your used needles and syringes in a special sharps container. Do not put them in a trash can. If you do not have a sharps container, call your pharmacist or healthcare provider to get one. A special MedGuide will be given to you by the pharmacist with each prescription and refill. Be sure to read this information carefully each time. Talk to your pediatrician regarding the use of this medicine in children. While this medicine may be used in children as young as 1 year for selected conditions, precautions do  apply. Overdosage: If you think you have taken too much of this medicine contact a poison control center or emergency room at once. NOTE: This medicine is only for you. Do not share this medicine with others. What if I miss a dose? If you miss a dose, take it as soon as you can. If it is almost time for your next dose, take only that dose. Do not take double or extra doses. What may interact with this medicine? Do not take this medicine with any of the following medications: -epoetin alfa This list may not describe all possible interactions. Give your health care provider a list of all the medicines, herbs, non-prescription drugs, or dietary supplements you use. Also tell them if you smoke, drink alcohol, or use illegal drugs. Some items may interact with your medicine. What should I watch for while using this medicine? Your condition will be monitored carefully while you are receiving this medicine. You may need blood work done while you are taking this medicine. What side effects may I notice from receiving this medicine? Side effects that you should report to your doctor or health care professional as soon as possible: -allergic reactions like skin rash, itching or hives, swelling of the face, lips, or tongue -breathing problems -changes in vision -chest pain -confusion, trouble speaking or understanding -feeling faint or lightheaded, falls -high blood pressure -muscle aches or pains -pain, swelling, warmth in the leg -rapid weight gain -severe headaches -sudden numbness or weakness of the face, arm or leg -trouble walking, dizziness, loss of balance or coordination -seizures (convulsions) -swelling of the ankles, feet, hands -  unusually weak or tired Side effects that usually do not require medical attention (report to your doctor or health care professional if they continue or are bothersome): -diarrhea -fever, chills (flu-like symptoms) -headaches -nausea, vomiting -redness,  stinging, or swelling at site where injected This list may not describe all possible side effects. Call your doctor for medical advice about side effects. You may report side effects to FDA at 1-800-FDA-1088. Where should I keep my medicine? Keep out of the reach of children. Store in a refrigerator between 2 and 8 degrees C (36 and 46 degrees F). Do not freeze. Do not shake. Throw away any unused portion if using a single-dose vial. Throw away any unused medicine after the expiration date. NOTE: This sheet is a summary. It may not cover all possible information. If you have questions about this medicine, talk to your doctor, pharmacist, or health care provider.  2018 Elsevier/Gold Standard (2016-02-13 19:52:26)  Iron-Rich Diet Iron is a mineral that helps your body to produce hemoglobin. Hemoglobin is a protein in your red blood cells that carries oxygen to your body's tissues. Eating too little iron may cause you to feel weak and tired, and it can increase your risk for infection. Eating enough iron is necessary for your body's metabolism, muscle function, and nervous system. Iron is naturally found in many foods. It can also be added to foods or fortified in foods. There are two types of dietary iron:  Heme iron. Heme iron is absorbed by the body more easily than nonheme iron. Heme iron is found in meat, poultry, and fish.  Nonheme iron. Nonheme iron is found in dietary supplements, iron-fortified grains, beans, and vegetables.  You may need to follow an iron-rich diet if:  You have been diagnosed with iron deficiency or iron-deficiency anemia.  You have a condition that prevents you from absorbing dietary iron, such as: ? Infection in your intestines. ? Celiac disease. This involves long-lasting (chronic) inflammation of your intestines.  You do not eat enough iron.  You eat a diet that is high in foods that impair iron absorption.  You have lost a lot of blood.  You have heavy  bleeding during your menstrual cycle.  You are pregnant.  What is my plan? Your health care provider may help you to determine how much iron you need per day based on your condition. Generally, when a person consumes sufficient amounts of iron in the diet, the following iron needs are met:  Men. ? 14-18 years old: 11 mg per day. ? 19-50 years old: 8 mg per day.  Women. ? 14-18 years old: 15 mg per day. ? 19-50 years old: 18 mg per day. ? Over 50 years old: 8 mg per day. ? Pregnant women: 27 mg per day. ? Breastfeeding women: 9 mg per day.  What do I need to know about an iron-rich diet?  Eat fresh fruits and vegetables that are high in vitamin C along with foods that are high in iron. This will help increase the amount of iron that your body absorbs from food, especially with foods containing nonheme iron. Foods that are high in vitamin C include oranges, peppers, tomatoes, and mango.  Take iron supplements only as directed by your health care provider. Overdose of iron can be life-threatening. If you were prescribed iron supplements, take them with orange juice or a vitamin C supplement.  Cook foods in pots and pans that are made from iron.  Eat nonheme iron-containing foods alongside foods   that are high in heme iron. This helps to improve your iron absorption.  Certain foods and drinks contain compounds that impair iron absorption. Avoid eating these foods in the same meal as iron-rich foods or with iron supplements. These include: ? Coffee, black tea, and red wine. ? Milk, dairy products, and foods that are high in calcium. ? Beans, soybeans, and peas. ? Whole grains.  When eating foods that contain both nonheme iron and compounds that impair iron absorption, follow these tips to absorb iron better. ? Soak beans overnight before cooking. ? Soak whole grains overnight and drain them before using. ? Ferment flours before baking, such as using yeast in bread dough. What foods  can I eat? Grains Iron-fortified breakfast cereal. Iron-fortified whole-wheat bread. Enriched rice. Sprouted grains. Vegetables Spinach. Potatoes with skin. Green peas. Broccoli. Red and green bell peppers. Fermented vegetables. Fruits Prunes. Raisins. Oranges. Strawberries. Mango. Grapefruit. Meats and Other Protein Sources Beef liver. Oysters. Beef. Shrimp. [place]. Chicken. [place]. Sardines. Chickpeas. Nuts. Tofu. Beverages Tomato juice. Fresh orange juice. Prune juice. Hibiscus tea. Fortified instant breakfast shakes. Condiments Tahini. Fermented soy sauce. Sweets and Desserts Black-strap molasses. Other Wheat germ. The items listed above may not be a complete list of recommended foods or beverages. Contact your dietitian for more options. What foods are not recommended? Grains Whole grains. Bran cereal. Bran flour. Oats. Vegetables Artichokes. Brussels sprouts. Kale. Fruits Blueberries. Raspberries. Strawberries. Figs. Meats and Other Protein Sources Soybeans. Products made from soy protein. Dairy Milk. Cream. Cheese. Yogurt. Cottage cheese. Beverages Coffee. Black tea. Red wine. Sweets and Desserts Cocoa. Chocolate. Ice cream. Other Basil. Oregano. Parsley. The items listed above may not be a complete list of foods and beverages to avoid. Contact your dietitian for more information. This information is not intended to replace advice given to you by your health care provider. Make sure you discuss any questions you have with your health care provider. Document Released: 02/06/2005 Document Revised: 01/13/2016 Document Reviewed: 01/20/2014 Elsevier Interactive Patient Education  [employer].

## 2017-08-29 NOTE — Progress Notes (Signed)
Verbal order per Dr. Julien Nordmann  To give Aranesp with BP of 160/69.

## 2017-08-31 NOTE — Progress Notes (Signed)
Norma [name] Date of Birth: [birth year] Medical Record #[id number]  History of Present Illness: Norma [name] is seen for followup CAD and PAD. She has a hsitory of  complex vascular surgery in November of 2012 including aorto-bifemoral bypass for aortic occlusive disease. She had CABG back in June of 2011. She has chronic swelling of her legs from venous insufficiency, pulmonary HTN,  and CKD.  Echo in Feb. 2014  showed a normal EF and moderate PHTN and grade 1 diastolic dysfunction.. She has a history of bradycardia.  She has refractory HTN.  She has a history of pulsatile tinnitus.  Carotid dopplers in August 2015 showed no obstructive disease. She underwent CT of the aorta which showed no obstructive disease in the aorta or great vessels. There was no aneurysm. Unfortunately she developed contrast induced nephropathy with increased BUN to 85 and creatinine to 3.57. Her renal function later improved.   In September 2017 she underwent right THR. This was complicated by a wound infection that required debridement and wound VAC as well as antibiotics. She also has significant lumbar arthropathy. In February she was seen by hematology with complaints of fatigue and was severely anemic. Hgb 7.5. She was transfused 2 units PRBCs.   She is followed by hematology for chronic anemia. She sees Dr. Florene [name] for her CKD. She is on Aranesp and iron. Last Hgb 10.1. She was diagnosed with Hepatitis C and has undergone treatment. Is scheduled for lab work to see if she is cured. Also diagnosed with kidney stones but no additional therapy planned.   On follow up today she does note chronic dyspnea and fatigue.  She has no chest pain or claudication. She notes that Dr. Florene [name] has increased hydralazine dose and lasix.   Current Outpatient Medications  Medication Sig Dispense Refill  . allopurinol (ZYLOPRIM) 100 MG tablet Take 100 mg by mouth daily as needed.    Marland Kitchen aspirin EC 325 MG EC tablet Take 1 tablet (325 mg total)  by mouth daily with breakfast. 30 tablet 0  . atorvastatin (LIPITOR) 80 MG tablet Take 1 tablet (80 mg total) by mouth daily. (Patient taking differently: Take 80 mg by mouth every morning. ) 30 tablet 6  . carvedilol (COREG) 12.5 MG tablet Take 1 tablet (12.5 mg total) by mouth 2 (two) times daily. 180 tablet 3  . cholecalciferol (VITAMIN D) 1000 units tablet Take 1,000 Units by mouth daily as needed (supplement).    . cloNIDine (CATAPRES) 0.3 MG tablet Take 0.3 mg by mouth 2 (two) times daily.     . Coenzyme Q10 200 MG capsule Take 200 mg by mouth every morning.     Marland Kitchen DARBEPOETIN ALFA IJ Inject as directed.    . ferrous sulfate 325 (65 FE) MG tablet Take 325 mg by mouth daily with breakfast.    . furosemide (LASIX) 80 MG tablet Take 80 mg by mouth daily.     Marland Kitchen glucose blood test strip 1.8 each by Other route as needed for other. Use as instructed    . hydrALAZINE (APRESOLINE) 50 MG tablet Take 50 mg by mouth 3 (three) times daily.    Marland Kitchen HYDROcodone-acetaminophen (NORCO/VICODIN) 5-325 MG tablet Take 1 tablet by mouth every 6 (six) hours as needed for moderate pain. 60 tablet 0  . insulin glargine (LANTUS) 100 UNIT/ML injection Inject 0.2 mLs (20 Units total) into the skin 2 (two) times daily. 10 mL 11  . isosorbide dinitrate (ISORDIL) 20 MG tablet Take 40 mg by mouth  2 (two) times daily.     . Liraglutide (VICTOZA [place]) Inject 1.8 mg into the skin daily.    Marland Kitchen losartan (COZAAR) 100 MG tablet Take 100 mg by mouth daily.    . Magnesium 250 MG TABS Take 250 mg by mouth every morning.     Marland Kitchen OVER THE COUNTER MEDICATION Over the counter laxative from [facility]    . Probiotic Product (PROBIOTIC DAILY PO) Take 1 tablet by mouth daily as needed (supplement).     . vitamin B-12 (CYANOCOBALAMIN) 500 MCG tablet Take 500 mcg by mouth every morning.      No current facility-administered medications for this visit.     Allergies  Allergen Reactions  . Iohexol Itching and Rash     Code: RASH, Desc: pt called 1  day post scanning stating that skin was red and "itching all over" some what better but still had symptom.. instructed pt to take benadryl to relieve symptoms,per dr Alvester Chou.if any problems call back/mms, Onset Date: [id number]   . Sulfa Antibiotics Other (See Comments)    UNSPECIFIED REACTION     Past Medical History:  Diagnosis Date  . Anxiety   . Carotid artery occlusion   . CKD (chronic kidney disease) stage 3, GFR 30-59 ml/min (HCC) 04/18/2014  . Claudication ([place])   . Constipation    Due to patient taking iron supplements  . Coronary artery disease   . DDD (degenerative disc disease)   . Diabetes mellitus   . Diabetic coma ([place])   . DJD (degenerative joint disease)   . DJD (degenerative joint disease)   . Edema    Bilateral lower extermities  . Gout   . Hyperlipidemia   . Hypertension   . Iron deficiency anemia   . Leg pain   . Obesity   . PAD (peripheral artery disease) ([place])   . Shortness of breath    exertion    Past Surgical History:  Procedure Laterality Date  . AORTA - BILATERAL FEMORAL ARTERY BYPASS GRAFT  05/25/2011   Procedure: AORTA BIFEMORAL BYPASS GRAFT;  Surgeon: Mal Misty, MD;  Location: [facility];  Service: Vascular;  Laterality: N/A;  . APPLICATION OF WOUND VAC Right 04/23/2016   Procedure: APPLICATION OF WOUND VAC CHANGED;  Surgeon: Meredith Pel, MD;  Location: [facility];  Service: Orthopedics;  Laterality: Right;  . BREAST EXCISIONAL BIOPSY Right   . BREAST SURGERY    . CARDIAC CATHETERIZATION  12/01/2009   EF 65%  . CARDIOVASCULAR STRESS TEST  11/28/2009   EF 75%  . COLONOSCOPY W/ POLYPECTOMY    . CORONARY ARTERY BYPASS GRAFT  12/08/2009   LIMA GRAFT TO THE DISTAL LAD AND SAPHENOUS VEIN GRAFT TO THE OBTUSE MARGINAL VESSEL  . EYE SURGERY    . I&D EXTREMITY Right 04/23/2016   Procedure: IRRIGATION AND DEBRIDEMENT EXTREMITY;  Surgeon: Meredith Pel, MD;  Location: [facility];  Service: Orthopedics;  Laterality: Right;  . INCISION AND DRAINAGE HIP  Right 04/20/2016   Procedure: IRRIGATION AND DEBRIDEMENT HIP WITH POLY EXCHANGE, ABX BEAD PLACEMENT, WOUND VAC ;  Surgeon: Meredith Pel, MD;  Location: [facility];  Service: Orthopedics;  Laterality: Right;  RIGHT HIP SUPERFICIAL I&D, POSSIBLE DEEP I&D, LINER EXCHANGE, ABX BEAD PLACEMENT, WOUND VAC.   Marland Kitchen PR VEIN BYPASS GRAFT,AORTO-FEM-POP  05/25/11  . REMOVAL OF FIBROUS CYST FROM RIGHT BREAST  10+ YEARS  . RETINAL DETACHMENT SURGERY  10+ YEARS   LEFT EYE  . TOTAL HIP ARTHROPLASTY Right 03/06/2016  .  TOTAL HIP ARTHROPLASTY Right 03/06/2016   Procedure: RIGHT TOTAL HIP ARTHROPLASTY ANTERIOR APPROACH;  Surgeon: Meredith Pel, MD;  Location: [facility];  Service: Orthopedics;  Laterality: Right;  . TRANSTHORACIC ECHOCARDIOGRAM  12/01/2009   EF 60-65%    Social History   Tobacco Use  Smoking Status Former Smoker  . Years: 20.00  . Types: Cigarettes  . Last attempt to quit: 07/10/1991  . Years since quitting: 26.1  Smokeless Tobacco Never Used    Social History   Substance and Sexual Activity  Alcohol Use No    Family History  Problem Relation Age of Onset  . Hypertension Mother   . Coronary artery disease Father   . Hypertension Sister   . Cirrhosis Brother   . Kidney disease Daughter     Review of Systems: The review of systems is per the HPI.   All other systems were reviewed and are negative.  Physical Exam: BP (!) 150/54 (BP Location: Right Arm, Patient Position: Sitting, Cuff Size: Large)   Pulse (!) 49   Ht 5\' 2"  (1.575 m)   BMI 36.69 kg/m  GENERAL:  Well appearing BF seen in wheelchair. HEENT:  PERRL, EOMI, sclera are clear. Oropharynx is clear. NECK:  No jugular venous distention, carotid upstroke brisk and symmetric, loud bilateral carotid and subclavian  bruits, no thyromegaly or adenopathy LUNGS:  Clear to auscultation bilaterally CHEST:  Unremarkable HEART:  RRR,  PMI not displaced or sustained,S1 and S2 within normal limits, no S3, no S4: no clicks, no rubs,  no murmurs ABD:  Soft, nontender. BS +, no masses or bruits. No hepatomegaly, no splenomegaly EXT:  2 + pulses throughout, 1+ edema- support hose in place, no cyanosis no clubbing SKIN:  Warm and dry.  No rashes NEURO:  Alert and oriented x 3. Cranial nerves II through XII intact. PSYCH:  Cognitively intact    LABORATORY DATA:   Lab Results  Component Value Date   WBC 4.4 08/29/2017   HGB 10.1 (L) 08/29/2017   HCT 33.1 (L) 08/29/2017   PLT 214 08/29/2017   GLUCOSE 196 (H) 08/08/2017   CHOL 138 04/30/2014   TRIG 169 (H) 04/30/2014   HDL 29 (L) 04/30/2014   LDLCALC 75 04/30/2014   ALT 4 08/08/2017   AST 11 08/08/2017   NA 143 08/08/2017   K 4.5 08/08/2017   CL 114 (H) 08/08/2017   CREATININE 3.31 (HH) 08/08/2017   BUN 68 (H) 08/08/2017   CO2 21 (L) 08/08/2017   TSH 2.193 04/16/2014   INR 1.09 01/18/2015   HGBA1C 6.9 (H) 02/27/2016   Labs dated 08/15/16: A1c 8.1%. Cholesterol 181, triglycerides 93, HDL 62, LDL 100.   Ecg today shows sinus brady with rate 49.  Nonspecific TWA.  I have personally reviewed and interpreted this study.   Assessment / Plan: 1. HTN - Chronic and resistant. On multiple medications. BP actually looks pretty good for her today.  Continue current therapy.   2. CAD - with past CABG - asymptomatic. No plans for additional work up unless she has clear cut symptoms.   3. PVD - with past aortobifem bypass in 2012 - by Dr. Kellie Simmering. Stable claudication.  4. Palpitations. Event monitor was unremarkable except for benign PACs.   5. Severe aortic atherosclerosis. No obstructive disease or aneurysm.   6. Anemia. s/p transfusion. Followed by hematology. Now on Aranesp and iron.  7. 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 Avoid contrast studies in the future  unless absolutely necessary. Followed by Nephrology.    I will follow up in 1 year

## 2017-09-05 ENCOUNTER — Ambulatory Visit: Payer: Medicare Other | Admitting: Cardiology

## 2017-09-05 ENCOUNTER — Encounter: Payer: Self-pay | Admitting: Cardiology

## 2017-09-05 VITALS — BP 150/54 | HR 49 | Ht 62.0 in

## 2017-09-05 DIAGNOSIS — N184 Chronic kidney disease, stage 4 (severe): Secondary | ICD-10-CM

## 2017-09-05 DIAGNOSIS — I10 Essential (primary) hypertension: Secondary | ICD-10-CM

## 2017-09-05 DIAGNOSIS — Z95828 Presence of other vascular implants and grafts: Secondary | ICD-10-CM | POA: Diagnosis not present

## 2017-09-05 DIAGNOSIS — I2510 Atherosclerotic heart disease of native coronary artery without angina pectoris: Secondary | ICD-10-CM

## 2017-09-05 NOTE — Patient Instructions (Signed)
Continue your current therapy  I will see you in one year   

## 2017-09-11 ENCOUNTER — Inpatient Hospital Stay: Payer: Medicare Other

## 2017-09-18 ENCOUNTER — Other Ambulatory Visit: Payer: Self-pay | Admitting: Family Medicine

## 2017-09-18 DIAGNOSIS — Z1231 Encounter for screening mammogram for malignant neoplasm of breast: Secondary | ICD-10-CM

## 2017-09-20 ENCOUNTER — Other Ambulatory Visit: Payer: Self-pay | Admitting: Nurse Practitioner

## 2017-09-20 ENCOUNTER — Inpatient Hospital Stay: Payer: Medicare Other | Attending: Internal Medicine

## 2017-09-20 ENCOUNTER — Inpatient Hospital Stay: Payer: Medicare Other

## 2017-09-20 VITALS — BP 140/56 | HR 55 | Temp 98.3°F | Resp 20

## 2017-09-20 DIAGNOSIS — I129 Hypertensive chronic kidney disease with stage 1 through stage 4 chronic kidney disease, or unspecified chronic kidney disease: Secondary | ICD-10-CM | POA: Diagnosis not present

## 2017-09-20 DIAGNOSIS — D638 Anemia in other chronic diseases classified elsewhere: Secondary | ICD-10-CM

## 2017-09-20 DIAGNOSIS — Z79899 Other long term (current) drug therapy: Secondary | ICD-10-CM | POA: Insufficient documentation

## 2017-09-20 DIAGNOSIS — N189 Chronic kidney disease, unspecified: Secondary | ICD-10-CM | POA: Insufficient documentation

## 2017-09-20 DIAGNOSIS — N184 Chronic kidney disease, stage 4 (severe): Secondary | ICD-10-CM

## 2017-09-20 DIAGNOSIS — K7469 Other cirrhosis of liver: Secondary | ICD-10-CM

## 2017-09-20 DIAGNOSIS — D631 Anemia in chronic kidney disease: Secondary | ICD-10-CM | POA: Insufficient documentation

## 2017-09-20 LAB — CBC WITH DIFFERENTIAL/PLATELET
BASOS ABS: 0 10*3/uL (ref 0.0–0.1)
BASOS PCT: 1 %
EOS PCT: 3 %
Eosinophils Absolute: 0.1 10*3/uL (ref 0.0–0.5)
HEMATOCRIT: 32.8 % — AB (ref 34.8–46.6)
Hemoglobin: 10 g/dL — ABNORMAL LOW (ref 11.6–15.9)
Lymphocytes Relative: 16 %
Lymphs Abs: 0.6 10*3/uL — ABNORMAL LOW (ref 0.9–3.3)
MCH: 27.3 pg (ref 25.1–34.0)
MCHC: 30.5 g/dL — ABNORMAL LOW (ref 31.5–36.0)
MCV: 89.6 fL (ref 79.5–101.0)
MONO ABS: 0.2 10*3/uL (ref 0.1–0.9)
Monocytes Relative: 6 %
NEUTROS ABS: 3 10*3/uL (ref 1.5–6.5)
Neutrophils Relative %: 74 %
PLATELETS: 231 10*3/uL (ref 145–400)
RBC: 3.66 MIL/uL — ABNORMAL LOW (ref 3.70–5.45)
RDW: 18.3 % — AB (ref 11.2–14.5)
WBC: 4 10*3/uL (ref 3.9–10.3)

## 2017-09-20 MED ORDER — DARBEPOETIN ALFA 300 MCG/0.6ML IJ SOSY
300.0000 ug | PREFILLED_SYRINGE | Freq: Once | INTRAMUSCULAR | Status: AC
  Administered 2017-09-20: 300 ug via SUBCUTANEOUS

## 2017-09-20 NOTE — Patient Instructions (Signed)

## 2017-10-02 ENCOUNTER — Ambulatory Visit
Admission: RE | Admit: 2017-10-02 | Discharge: 2017-10-02 | Disposition: A | Discharge status: Home / Self Care | Payer: Medicare Other | Source: Ambulatory Visit | Attending: Nurse Practitioner | Admitting: Nurse Practitioner

## 2017-10-02 DIAGNOSIS — K7469 Other cirrhosis of liver: Secondary | ICD-10-CM

## 2017-10-11 ENCOUNTER — Inpatient Hospital Stay: Payer: Medicare Other | Attending: Internal Medicine

## 2017-10-11 ENCOUNTER — Inpatient Hospital Stay: Payer: Medicare Other

## 2017-10-11 VITALS — BP 142/70 | HR 60 | Temp 98.0°F | Resp 18

## 2017-10-11 DIAGNOSIS — Z79899 Other long term (current) drug therapy: Secondary | ICD-10-CM | POA: Insufficient documentation

## 2017-10-11 DIAGNOSIS — N184 Chronic kidney disease, stage 4 (severe): Secondary | ICD-10-CM

## 2017-10-11 DIAGNOSIS — N189 Chronic kidney disease, unspecified: Secondary | ICD-10-CM | POA: Insufficient documentation

## 2017-10-11 DIAGNOSIS — I129 Hypertensive chronic kidney disease with stage 1 through stage 4 chronic kidney disease, or unspecified chronic kidney disease: Secondary | ICD-10-CM | POA: Diagnosis present

## 2017-10-11 DIAGNOSIS — D631 Anemia in chronic kidney disease: Secondary | ICD-10-CM | POA: Diagnosis not present

## 2017-10-11 DIAGNOSIS — D638 Anemia in other chronic diseases classified elsewhere: Secondary | ICD-10-CM

## 2017-10-11 LAB — CBC WITH DIFFERENTIAL/PLATELET
Basophils Absolute: 0 10*3/uL (ref 0.0–0.1)
Basophils Relative: 1 %
EOS ABS: 0.1 10*3/uL (ref 0.0–0.5)
EOS PCT: 2 %
HCT: 34.2 % — ABNORMAL LOW (ref 34.8–46.6)
Hemoglobin: 10.3 g/dL — ABNORMAL LOW (ref 11.6–15.9)
LYMPHS ABS: 0.6 10*3/uL — AB (ref 0.9–3.3)
Lymphocytes Relative: 16 %
MCH: 27.5 pg (ref 25.1–34.0)
MCHC: 30.1 g/dL — AB (ref 31.5–36.0)
MCV: 91.2 fL (ref 79.5–101.0)
MONOS PCT: 8 %
Monocytes Absolute: 0.3 10*3/uL (ref 0.1–0.9)
Neutro Abs: 2.8 10*3/uL (ref 1.5–6.5)
Neutrophils Relative %: 73 %
PLATELETS: 206 10*3/uL (ref 145–400)
RBC: 3.75 MIL/uL (ref 3.70–5.45)
RDW: 18.9 % — ABNORMAL HIGH (ref 11.2–14.5)
WBC: 3.8 10*3/uL — AB (ref 3.9–10.3)

## 2017-10-11 MED ORDER — DARBEPOETIN ALFA 300 MCG/0.6ML IJ SOSY
PREFILLED_SYRINGE | INTRAMUSCULAR | Status: AC
  Filled 2017-10-11: qty 0.6

## 2017-10-11 MED ORDER — DARBEPOETIN ALFA 300 MCG/0.6ML IJ SOSY
300.0000 ug | PREFILLED_SYRINGE | Freq: Once | INTRAMUSCULAR | Status: AC
  Administered 2017-10-11: 300 ug via SUBCUTANEOUS

## 2017-10-11 NOTE — Patient Instructions (Signed)

## 2017-10-23 ENCOUNTER — Ambulatory Visit
Admission: RE | Admit: 2017-10-23 | Discharge: 2017-10-23 | Disposition: A | Discharge status: Home / Self Care | Payer: Medicare Other | Source: Ambulatory Visit | Attending: Family Medicine | Admitting: Family Medicine

## 2017-10-23 DIAGNOSIS — Z1231 Encounter for screening mammogram for malignant neoplasm of breast: Secondary | ICD-10-CM

## 2017-11-01 ENCOUNTER — Inpatient Hospital Stay: Payer: Medicare Other

## 2017-11-01 VITALS — BP 140/60 | HR 55 | Temp 98.3°F | Resp 20

## 2017-11-01 DIAGNOSIS — N184 Chronic kidney disease, stage 4 (severe): Secondary | ICD-10-CM

## 2017-11-01 DIAGNOSIS — D638 Anemia in other chronic diseases classified elsewhere: Secondary | ICD-10-CM

## 2017-11-01 DIAGNOSIS — I129 Hypertensive chronic kidney disease with stage 1 through stage 4 chronic kidney disease, or unspecified chronic kidney disease: Secondary | ICD-10-CM | POA: Diagnosis not present

## 2017-11-01 LAB — CBC WITH DIFFERENTIAL/PLATELET
Basophils Absolute: 0 10*3/uL (ref 0.0–0.1)
Basophils Relative: 1 %
EOS PCT: 3 %
Eosinophils Absolute: 0.1 10*3/uL (ref 0.0–0.5)
HEMATOCRIT: 31.3 % — AB (ref 34.8–46.6)
Hemoglobin: 9.8 g/dL — ABNORMAL LOW (ref 11.6–15.9)
LYMPHS ABS: 0.5 10*3/uL — AB (ref 0.9–3.3)
Lymphocytes Relative: 11 %
MCH: 27.4 pg (ref 25.1–34.0)
MCHC: 31.2 g/dL — ABNORMAL LOW (ref 31.5–36.0)
MCV: 88 fL (ref 79.5–101.0)
MONO ABS: 0.4 10*3/uL (ref 0.1–0.9)
Monocytes Relative: 9 %
NEUTROS ABS: 3.1 10*3/uL (ref 1.5–6.5)
Neutrophils Relative %: 76 %
PLATELETS: 199 10*3/uL (ref 145–400)
RBC: 3.56 MIL/uL — AB (ref 3.70–5.45)
RDW: 19.4 % — AB (ref 11.2–14.5)
WBC: 4.1 10*3/uL (ref 3.9–10.3)

## 2017-11-01 MED ORDER — DARBEPOETIN ALFA 300 MCG/0.6ML IJ SOSY
300.0000 ug | PREFILLED_SYRINGE | Freq: Once | INTRAMUSCULAR | Status: AC
  Administered 2017-11-01: 300 ug via SUBCUTANEOUS

## 2017-11-01 NOTE — Patient Instructions (Signed)
Darbepoetin Alfa injection What is this medicine? DARBEPOETIN ALFA (dar be POE e tin AL fa) helps your body make more red blood cells. It is used to treat anemia caused by chronic kidney failure and chemotherapy. This medicine may be used for other purposes; ask your health care provider or pharmacist if you have questions. COMMON BRAND NAME(S): Aranesp What should I tell my health care provider before I take this medicine? They need to know if you have any of these conditions: -blood clotting disorders or history of blood clots -cancer patient not on chemotherapy -cystic fibrosis -heart disease, such as angina, heart failure, or a history of a heart attack -hemoglobin level of 12 g/dL or greater -high blood pressure -low levels of folate, iron, or vitamin B12 -seizures -an unusual or allergic reaction to darbepoetin, erythropoietin, albumin, hamster proteins, latex, other medicines, foods, dyes, or preservatives -pregnant or trying to get pregnant -breast-feeding How should I use this medicine? This medicine is for injection into a vein or under the skin. It is usually given by a health care professional in a hospital or clinic setting. If you get this medicine at home, you will be taught how to prepare and give this medicine. Use exactly as directed. Take your medicine at regular intervals. Do not take your medicine more often than directed. It is important that you put your used needles and syringes in a special sharps container. Do not put them in a trash can. If you do not have a sharps container, call your pharmacist or healthcare provider to get one. A special MedGuide will be given to you by the pharmacist with each prescription and refill. Be sure to read this information carefully each time. Talk to your pediatrician regarding the use of this medicine in children. While this medicine may be used in children as young as 1 year for selected conditions, precautions do  apply. Overdosage: If you think you have taken too much of this medicine contact a poison control center or emergency room at once. NOTE: This medicine is only for you. Do not share this medicine with others. What if I miss a dose? If you miss a dose, take it as soon as you can. If it is almost time for your next dose, take only that dose. Do not take double or extra doses. What may interact with this medicine? Do not take this medicine with any of the following medications: -epoetin alfa This list may not describe all possible interactions. Give your health care provider a list of all the medicines, herbs, non-prescription drugs, or dietary supplements you use. Also tell them if you smoke, drink alcohol, or use illegal drugs. Some items may interact with your medicine. What should I watch for while using this medicine? Your condition will be monitored carefully while you are receiving this medicine. You may need blood work done while you are taking this medicine. What side effects may I notice from receiving this medicine? Side effects that you should report to your doctor or health care professional as soon as possible: -allergic reactions like skin rash, itching or hives, swelling of the face, lips, or tongue -breathing problems -changes in vision -chest pain -confusion, trouble speaking or understanding -feeling faint or lightheaded, falls -high blood pressure -muscle aches or pains -pain, swelling, warmth in the leg -rapid weight gain -severe headaches -sudden numbness or weakness of the face, arm or leg -trouble walking, dizziness, loss of balance or coordination -seizures (convulsions) -swelling of the ankles, feet, hands -  unusually weak or tired Side effects that usually do not require medical attention (report to your doctor or health care professional if they continue or are bothersome): -diarrhea -fever, chills (flu-like symptoms) -headaches -nausea, vomiting -redness,  stinging, or swelling at site where injected This list may not describe all possible side effects. Call your doctor for medical advice about side effects. You may report side effects to FDA at 1-800-FDA-1088. Where should I keep my medicine? Keep out of the reach of children. Store in a refrigerator between 2 and 8 degrees C (36 and 46 degrees F). Do not freeze. Do not shake. Throw away any unused portion if using a single-dose vial. Throw away any unused medicine after the expiration date. NOTE: This sheet is a summary. It may not cover all possible information. If you have questions about this medicine, talk to your doctor, pharmacist, or health care provider.  2018 Elsevier/Gold Standard (2016-02-13 19:52:26)  Iron-Rich Diet Iron is a mineral that helps your body to produce hemoglobin. Hemoglobin is a protein in your red blood cells that carries oxygen to your body's tissues. Eating too little iron may cause you to feel weak and tired, and it can increase your risk for infection. Eating enough iron is necessary for your body's metabolism, muscle function, and nervous system. Iron is naturally found in many foods. It can also be added to foods or fortified in foods. There are two types of dietary iron:  Heme iron. Heme iron is absorbed by the body more easily than nonheme iron. Heme iron is found in meat, poultry, and fish.  Nonheme iron. Nonheme iron is found in dietary supplements, iron-fortified grains, beans, and vegetables.  You may need to follow an iron-rich diet if:  You have been diagnosed with iron deficiency or iron-deficiency anemia.  You have a condition that prevents you from absorbing dietary iron, such as: ? Infection in your intestines. ? Celiac disease. This involves long-lasting (chronic) inflammation of your intestines.  You do not eat enough iron.  You eat a diet that is high in foods that impair iron absorption.  You have lost a lot of blood.  You have heavy  bleeding during your menstrual cycle.  You are pregnant.  What is my plan? Your health care provider may help you to determine how much iron you need per day based on your condition. Generally, when a person consumes sufficient amounts of iron in the diet, the following iron needs are met:  Men. ? 14-18 years old: 11 mg per day. ? 19-50 years old: 8 mg per day.  Women. ? 14-18 years old: 15 mg per day. ? 19-50 years old: 18 mg per day. ? Over 50 years old: 8 mg per day. ? Pregnant women: 27 mg per day. ? Breastfeeding women: 9 mg per day.  What do I need to know about an iron-rich diet?  Eat fresh fruits and vegetables that are high in vitamin C along with foods that are high in iron. This will help increase the amount of iron that your body absorbs from food, especially with foods containing nonheme iron. Foods that are high in vitamin C include oranges, peppers, tomatoes, and mango.  Take iron supplements only as directed by your health care provider. Overdose of iron can be life-threatening. If you were prescribed iron supplements, take them with orange juice or a vitamin C supplement.  Cook foods in pots and pans that are made from iron.  Eat nonheme iron-containing foods alongside foods   that are high in heme iron. This helps to improve your iron absorption.  Certain foods and drinks contain compounds that impair iron absorption. Avoid eating these foods in the same meal as iron-rich foods or with iron supplements. These include: ? Coffee, black tea, and red wine. ? Milk, dairy products, and foods that are high in calcium. ? Beans, soybeans, and peas. ? Whole grains.  When eating foods that contain both nonheme iron and compounds that impair iron absorption, follow these tips to absorb iron better. ? Soak beans overnight before cooking. ? Soak whole grains overnight and drain them before using. ? Ferment flours before baking, such as using yeast in bread dough. What foods  can I eat? Grains Iron-fortified breakfast cereal. Iron-fortified whole-wheat bread. Enriched rice. Sprouted grains. Vegetables Spinach. Potatoes with skin. Green peas. Broccoli. Red and green bell peppers. Fermented vegetables. Fruits Prunes. Raisins. Oranges. Strawberries. Mango. Grapefruit. Meats and Other Protein Sources Beef liver. Oysters. Beef. Shrimp. [place]. Chicken. [place]. Sardines. Chickpeas. Nuts. Tofu. Beverages Tomato juice. Fresh orange juice. Prune juice. Hibiscus tea. Fortified instant breakfast shakes. Condiments Tahini. Fermented soy sauce. Sweets and Desserts Black-strap molasses. Other Wheat germ. The items listed above may not be a complete list of recommended foods or beverages. Contact your dietitian for more options. What foods are not recommended? Grains Whole grains. Bran cereal. Bran flour. Oats. Vegetables Artichokes. Brussels sprouts. Kale. Fruits Blueberries. Raspberries. Strawberries. Figs. Meats and Other Protein Sources Soybeans. Products made from soy protein. Dairy Milk. Cream. Cheese. Yogurt. Cottage cheese. Beverages Coffee. Black tea. Red wine. Sweets and Desserts Cocoa. Chocolate. Ice cream. Other Basil. Oregano. Parsley. The items listed above may not be a complete list of foods and beverages to avoid. Contact your dietitian for more information. This information is not intended to replace advice given to you by your health care provider. Make sure you discuss any questions you have with your health care provider. Document Released: 02/06/2005 Document Revised: 01/13/2016 Document Reviewed: 01/20/2014 Elsevier Interactive Patient Education  [employer].

## 2017-11-21 ENCOUNTER — Inpatient Hospital Stay: Payer: Medicare Other | Attending: Internal Medicine

## 2017-11-21 ENCOUNTER — Telehealth: Payer: Self-pay | Admitting: Internal Medicine

## 2017-11-21 ENCOUNTER — Inpatient Hospital Stay: Payer: Medicare Other

## 2017-11-21 ENCOUNTER — Inpatient Hospital Stay (HOSPITAL_BASED_OUTPATIENT_CLINIC_OR_DEPARTMENT_OTHER): Payer: Medicare Other | Admitting: Internal Medicine

## 2017-11-21 ENCOUNTER — Encounter: Payer: Self-pay | Admitting: Internal Medicine

## 2017-11-21 VITALS — BP 142/58 | HR 52 | Temp 98.3°F | Resp 17 | Ht 62.0 in | Wt 195.3 lb

## 2017-11-21 DIAGNOSIS — Z79899 Other long term (current) drug therapy: Secondary | ICD-10-CM

## 2017-11-21 DIAGNOSIS — I129 Hypertensive chronic kidney disease with stage 1 through stage 4 chronic kidney disease, or unspecified chronic kidney disease: Secondary | ICD-10-CM

## 2017-11-21 DIAGNOSIS — D631 Anemia in chronic kidney disease: Secondary | ICD-10-CM

## 2017-11-21 DIAGNOSIS — I10 Essential (primary) hypertension: Secondary | ICD-10-CM

## 2017-11-21 DIAGNOSIS — D638 Anemia in other chronic diseases classified elsewhere: Secondary | ICD-10-CM

## 2017-11-21 DIAGNOSIS — N183 Chronic kidney disease, stage 3 (moderate): Secondary | ICD-10-CM | POA: Insufficient documentation

## 2017-11-21 DIAGNOSIS — N184 Chronic kidney disease, stage 4 (severe): Secondary | ICD-10-CM

## 2017-11-21 LAB — CBC WITH DIFFERENTIAL (CANCER CENTER ONLY)
BASOS PCT: 1 %
Basophils Absolute: 0.1 10*3/uL (ref 0.0–0.1)
EOS ABS: 0.3 10*3/uL (ref 0.0–0.5)
Eosinophils Relative: 5 %
HEMATOCRIT: 31.9 % — AB (ref 34.8–46.6)
HEMOGLOBIN: 10 g/dL — AB (ref 11.6–15.9)
Lymphocytes Relative: 6 %
Lymphs Abs: 0.3 10*3/uL — ABNORMAL LOW (ref 0.9–3.3)
MCH: 27.2 pg (ref 25.1–34.0)
MCHC: 31.4 g/dL — AB (ref 31.5–36.0)
MCV: 86.5 fL (ref 79.5–101.0)
MONOS PCT: 7 %
Monocytes Absolute: 0.4 10*3/uL (ref 0.1–0.9)
NEUTROS ABS: 4.1 10*3/uL (ref 1.5–6.5)
NEUTROS PCT: 81 %
Platelet Count: 238 10*3/uL (ref 145–400)
RBC: 3.69 MIL/uL — ABNORMAL LOW (ref 3.70–5.45)
RDW: 19.3 % — ABNORMAL HIGH (ref 11.2–14.5)
WBC: 5.1 10*3/uL (ref 3.9–10.3)

## 2017-11-21 LAB — IRON AND TIBC
IRON: 25 ug/dL — AB (ref 41–142)
SATURATION RATIOS: 14 % — AB (ref 21–57)
TIBC: 173 ug/dL — ABNORMAL LOW (ref 236–444)
UIBC: 148 ug/dL

## 2017-11-21 LAB — FERRITIN: FERRITIN: 138 ng/mL (ref 9–269)

## 2017-11-21 MED ORDER — DARBEPOETIN ALFA 300 MCG/0.6ML IJ SOSY
PREFILLED_SYRINGE | INTRAMUSCULAR | Status: AC
  Filled 2017-11-21: qty 0.6

## 2017-11-21 MED ORDER — DARBEPOETIN ALFA 300 MCG/0.6ML IJ SOSY
300.0000 ug | PREFILLED_SYRINGE | Freq: Once | INTRAMUSCULAR | Status: AC
  Administered 2017-11-21: 300 ug via SUBCUTANEOUS

## 2017-11-21 NOTE — Telephone Encounter (Signed)
Scheduled appt per 5/16 los - sent reminder in the mail. -

## 2017-11-21 NOTE — Patient Instructions (Signed)
Darbepoetin Alfa injection What is this medicine? DARBEPOETIN ALFA (dar be POE e tin AL fa) helps your body make more red blood cells. It is used to treat anemia caused by chronic kidney failure and chemotherapy. This medicine may be used for other purposes; ask your health care provider or pharmacist if you have questions. COMMON BRAND NAME(S): Aranesp What should I tell my health care provider before I take this medicine? They need to know if you have any of these conditions: -blood clotting disorders or history of blood clots -cancer patient not on chemotherapy -cystic fibrosis -heart disease, such as angina, heart failure, or a history of a heart attack -hemoglobin level of 12 g/dL or greater -high blood pressure -low levels of folate, iron, or vitamin B12 -seizures -an unusual or allergic reaction to darbepoetin, erythropoietin, albumin, hamster proteins, latex, other medicines, foods, dyes, or preservatives -pregnant or trying to get pregnant -breast-feeding How should I use this medicine? This medicine is for injection into a vein or under the skin. It is usually given by a health care professional in a hospital or clinic setting. If you get this medicine at home, you will be taught how to prepare and give this medicine. Use exactly as directed. Take your medicine at regular intervals. Do not take your medicine more often than directed. It is important that you put your used needles and syringes in a special sharps container. Do not put them in a trash can. If you do not have a sharps container, call your pharmacist or healthcare provider to get one. A special MedGuide will be given to you by the pharmacist with each prescription and refill. Be sure to read this information carefully each time. Talk to your pediatrician regarding the use of this medicine in children. While this medicine may be used in children as young as 1 year for selected conditions, precautions do  apply. Overdosage: If you think you have taken too much of this medicine contact a poison control center or emergency room at once. NOTE: This medicine is only for you. Do not share this medicine with others. What if I miss a dose? If you miss a dose, take it as soon as you can. If it is almost time for your next dose, take only that dose. Do not take double or extra doses. What may interact with this medicine? Do not take this medicine with any of the following medications: -epoetin alfa This list may not describe all possible interactions. Give your health care provider a list of all the medicines, herbs, non-prescription drugs, or dietary supplements you use. Also tell them if you smoke, drink alcohol, or use illegal drugs. Some items may interact with your medicine. What should I watch for while using this medicine? Your condition will be monitored carefully while you are receiving this medicine. You may need blood work done while you are taking this medicine. What side effects may I notice from receiving this medicine? Side effects that you should report to your doctor or health care professional as soon as possible: -allergic reactions like skin rash, itching or hives, swelling of the face, lips, or tongue -breathing problems -changes in vision -chest pain -confusion, trouble speaking or understanding -feeling faint or lightheaded, falls -high blood pressure -muscle aches or pains -pain, swelling, warmth in the leg -rapid weight gain -severe headaches -sudden numbness or weakness of the face, arm or leg -trouble walking, dizziness, loss of balance or coordination -seizures (convulsions) -swelling of the ankles, feet, hands -  unusually weak or tired Side effects that usually do not require medical attention (report to your doctor or health care professional if they continue or are bothersome): -diarrhea -fever, chills (flu-like symptoms) -headaches -nausea, vomiting -redness,  stinging, or swelling at site where injected This list may not describe all possible side effects. Call your doctor for medical advice about side effects. You may report side effects to FDA at 1-800-FDA-1088. Where should I keep my medicine? Keep out of the reach of children. Store in a refrigerator between 2 and 8 degrees C (36 and 46 degrees F). Do not freeze. Do not shake. Throw away any unused portion if using a single-dose vial. Throw away any unused medicine after the expiration date. NOTE: This sheet is a summary. It may not cover all possible information. If you have questions about this medicine, talk to your doctor, pharmacist, or health care provider.  2018 Elsevier/Gold Standard (2016-02-13 19:52:26)  Iron-Rich Diet Iron is a mineral that helps your body to produce hemoglobin. Hemoglobin is a protein in your red blood cells that carries oxygen to your body's tissues. Eating too little iron may cause you to feel weak and tired, and it can increase your risk for infection. Eating enough iron is necessary for your body's metabolism, muscle function, and nervous system. Iron is naturally found in many foods. It can also be added to foods or fortified in foods. There are two types of dietary iron:  Heme iron. Heme iron is absorbed by the body more easily than nonheme iron. Heme iron is found in meat, poultry, and fish.  Nonheme iron. Nonheme iron is found in dietary supplements, iron-fortified grains, beans, and vegetables.  You may need to follow an iron-rich diet if:  You have been diagnosed with iron deficiency or iron-deficiency anemia.  You have a condition that prevents you from absorbing dietary iron, such as: ? Infection in your intestines. ? Celiac disease. This involves long-lasting (chronic) inflammation of your intestines.  You do not eat enough iron.  You eat a diet that is high in foods that impair iron absorption.  You have lost a lot of blood.  You have heavy  bleeding during your menstrual cycle.  You are pregnant.  What is my plan? Your health care provider may help you to determine how much iron you need per day based on your condition. Generally, when a person consumes sufficient amounts of iron in the diet, the following iron needs are met:  Men. ? 14-18 years old: 11 mg per day. ? 19-50 years old: 8 mg per day.  Women. ? 14-18 years old: 15 mg per day. ? 19-50 years old: 18 mg per day. ? Over 50 years old: 8 mg per day. ? Pregnant women: 27 mg per day. ? Breastfeeding women: 9 mg per day.  What do I need to know about an iron-rich diet?  Eat fresh fruits and vegetables that are high in vitamin C along with foods that are high in iron. This will help increase the amount of iron that your body absorbs from food, especially with foods containing nonheme iron. Foods that are high in vitamin C include oranges, peppers, tomatoes, and mango.  Take iron supplements only as directed by your health care provider. Overdose of iron can be life-threatening. If you were prescribed iron supplements, take them with orange juice or a vitamin C supplement.  Cook foods in pots and pans that are made from iron.  Eat nonheme iron-containing foods alongside foods   that are high in heme iron. This helps to improve your iron absorption.  Certain foods and drinks contain compounds that impair iron absorption. Avoid eating these foods in the same meal as iron-rich foods or with iron supplements. These include: ? Coffee, black tea, and red wine. ? Milk, dairy products, and foods that are high in calcium. ? Beans, soybeans, and peas. ? Whole grains.  When eating foods that contain both nonheme iron and compounds that impair iron absorption, follow these tips to absorb iron better. ? Soak beans overnight before cooking. ? Soak whole grains overnight and drain them before using. ? Ferment flours before baking, such as using yeast in bread dough. What foods  can I eat? Grains Iron-fortified breakfast cereal. Iron-fortified whole-wheat bread. Enriched rice. Sprouted grains. Vegetables Spinach. Potatoes with skin. Green peas. Broccoli. Red and green bell peppers. Fermented vegetables. Fruits Prunes. Raisins. Oranges. Strawberries. Mango. Grapefruit. Meats and Other Protein Sources Beef liver. Oysters. Beef. Shrimp. [place]. Chicken. [place]. Sardines. Chickpeas. Nuts. Tofu. Beverages Tomato juice. Fresh orange juice. Prune juice. Hibiscus tea. Fortified instant breakfast shakes. Condiments Tahini. Fermented soy sauce. Sweets and Desserts Black-strap molasses. Other Wheat germ. The items listed above may not be a complete list of recommended foods or beverages. Contact your dietitian for more options. What foods are not recommended? Grains Whole grains. Bran cereal. Bran flour. Oats. Vegetables Artichokes. Brussels sprouts. Kale. Fruits Blueberries. Raspberries. Strawberries. Figs. Meats and Other Protein Sources Soybeans. Products made from soy protein. Dairy Milk. Cream. Cheese. Yogurt. Cottage cheese. Beverages Coffee. Black tea. Red wine. Sweets and Desserts Cocoa. Chocolate. Ice cream. Other Basil. Oregano. Parsley. The items listed above may not be a complete list of foods and beverages to avoid. Contact your dietitian for more information. This information is not intended to replace advice given to you by your health care provider. Make sure you discuss any questions you have with your health care provider. Document Released: 02/06/2005 Document Revised: 01/13/2016 Document Reviewed: 01/20/2014 Elsevier Interactive Patient Education  [employer].

## 2017-11-21 NOTE — Progress Notes (Signed)
[facility] [phone]   [phone]  OFFICE PROGRESS NOTE  Bartholome Bill, MD [place] [id number]  DIAGNOSIS: Anemia of chronic disease secondary to chronic renal insufficiency.  PRIOR THERAPY: None  CURRENT THERAPY: Aranesp 300 g subcutaneously every 3 weeks. First dose 03/06/2017. Over-the-counter ferrous sulfate.  INTERVAL HISTORY: DAILY DOE 72 y.o. female returns to the clinic today for 3 months follow-up visit.  The patient is feeling fine today with no specific complaints.  She denied having any chest pain, shortness of breath, cough or hemoptysis.  She denied having any recent weight loss or night sweats.  She has no nausea, vomiting, diarrhea or constipation.  She had cataract surgery performed recently.  The patient has been tolerating her treatment with Aranesp fairly well.  She is here today for evaluation with repeat CBC, iron study and ferritin.  MEDICAL HISTORY: Past Medical History:  Diagnosis Date  . Anxiety   . Carotid artery occlusion   . CKD (chronic kidney disease) stage 3, GFR 30-59 ml/min (HCC) 04/18/2014  . Claudication ([place])   . Constipation    Due to patient taking iron supplements  . Coronary artery disease   . DDD (degenerative disc disease)   . Diabetes mellitus   . Diabetic coma ([place])   . DJD (degenerative joint disease)   . DJD (degenerative joint disease)   . Edema    Bilateral lower extermities  . Gout   . Hyperlipidemia   . Hypertension   . Iron deficiency anemia   . Leg pain   . Obesity   . PAD (peripheral artery disease) ([place])   . Shortness of breath    exertion    ALLERGIES:  is allergic to sulfasalazine; iohexol; and sulfa antibiotics.  MEDICATIONS:  Current Outpatient Medications  Medication Sig Dispense Refill  . colesevelam (WELCHOL) 625 MG tablet Take by mouth.    . predniSONE (DELTASONE) 10 MG tablet Taper 6 tab daily x 1 day, 5 tab daily  x 1 day, 4 tab daily x 1 day, 3 tab daily x 1 day, 2 tab daily x 1 day, 1 tab daily x 1 day    . triamcinolone ointment (KENALOG) 0.5 % Apply topically.    Marland Kitchen acetaminophen (TYLENOL) 325 MG tablet Take by mouth.    Marland Kitchen allopurinol (ZYLOPRIM) 100 MG tablet Take 100 mg by mouth daily as needed.    Marland Kitchen aspirin EC 325 MG EC tablet Take 1 tablet (325 mg total) by mouth daily with breakfast. 30 tablet 0  . atorvastatin (LIPITOR) 80 MG tablet Take 1 tablet (80 mg total) by mouth daily. (Patient taking differently: Take 80 mg by mouth every morning. ) 30 tablet 6  . brimonidine (ALPHAGAN) 0.2 % ophthalmic solution   1  . carvedilol (COREG) 12.5 MG tablet Take 1 tablet (12.5 mg total) by mouth 2 (two) times daily. 180 tablet 3  . cholecalciferol (VITAMIN D) 1000 units tablet Take 1,000 Units by mouth daily as needed (supplement).    . cloNIDine (CATAPRES) 0.3 MG tablet Take 0.3 mg by mouth 2 (two) times daily.     . Coenzyme Q10 200 MG capsule Take 200 mg by mouth every morning.     Marland Kitchen DARBEPOETIN ALFA IJ Inject as directed.    . ferrous sulfate 325 (65 FE) MG tablet Take 325 mg by mouth daily with breakfast.    . furosemide (LASIX) 80 MG tablet Take  80 mg by mouth daily.     Marland Kitchen glucose blood test strip 1.8 each by Other route as needed for other. Use as instructed    . hydrALAZINE (APRESOLINE) 50 MG tablet Take 50 mg by mouth 3 (three) times daily.    Marland Kitchen HYDROcodone-acetaminophen (NORCO/VICODIN) 5-325 MG tablet Take 1 tablet by mouth every 6 (six) hours as needed for moderate pain. 60 tablet 0  . insulin glargine (LANTUS) 100 UNIT/ML injection Inject 0.2 mLs (20 Units total) into the skin 2 (two) times daily. 10 mL 11  . isosorbide dinitrate (ISORDIL) 20 MG tablet Take 40 mg by mouth 2 (two) times daily.     Marland Kitchen ketorolac (ACULAR) 0.5 % ophthalmic solution   4  . Liraglutide (VICTOZA [place]) Inject 1.8 mg into the skin daily.    Marland Kitchen losartan (COZAAR) 100 MG tablet Take 100 mg by mouth daily.    . Magnesium 250 MG TABS  Take 250 mg by mouth every morning.     Marland Kitchen ofloxacin (OCUFLOX) 0.3 % ophthalmic solution   1  . OVER THE COUNTER MEDICATION Over the counter laxative from [facility]    . prednisoLONE acetate (PRED FORTE) 1 % ophthalmic suspension   1  . Probiotic Product (PROBIOTIC DAILY PO) Take 1 tablet by mouth daily as needed (supplement).     . vitamin B-12 (CYANOCOBALAMIN) 500 MCG tablet Take 500 mcg by mouth every morning.      No current facility-administered medications for this visit.     SURGICAL HISTORY:  Past Surgical History:  Procedure Laterality Date  . AORTA - BILATERAL FEMORAL ARTERY BYPASS GRAFT  05/25/2011   Procedure: AORTA BIFEMORAL BYPASS GRAFT;  Surgeon: Mal Misty, MD;  Location: [facility];  Service: Vascular;  Laterality: N/A;  . APPLICATION OF WOUND VAC Right 04/23/2016   Procedure: APPLICATION OF WOUND VAC CHANGED;  Surgeon: Meredith Pel, MD;  Location: [facility];  Service: Orthopedics;  Laterality: Right;  . BREAST EXCISIONAL BIOPSY Right   . BREAST SURGERY    . CARDIAC CATHETERIZATION  12/01/2009   EF 65%  . CARDIOVASCULAR STRESS TEST  11/28/2009   EF 75%  . COLONOSCOPY W/ POLYPECTOMY    . CORONARY ARTERY BYPASS GRAFT  12/08/2009   LIMA GRAFT TO THE DISTAL LAD AND SAPHENOUS VEIN GRAFT TO THE OBTUSE MARGINAL VESSEL  . EYE SURGERY    . I&D EXTREMITY Right 04/23/2016   Procedure: IRRIGATION AND DEBRIDEMENT EXTREMITY;  Surgeon: Meredith Pel, MD;  Location: [facility];  Service: Orthopedics;  Laterality: Right;  . INCISION AND DRAINAGE HIP Right 04/20/2016   Procedure: IRRIGATION AND DEBRIDEMENT HIP WITH POLY EXCHANGE, ABX BEAD PLACEMENT, WOUND VAC ;  Surgeon: Meredith Pel, MD;  Location: [facility];  Service: Orthopedics;  Laterality: Right;  RIGHT HIP SUPERFICIAL I&D, POSSIBLE DEEP I&D, LINER EXCHANGE, ABX BEAD PLACEMENT, WOUND VAC.   Marland Kitchen PR VEIN BYPASS GRAFT,AORTO-FEM-POP  05/25/11  . REMOVAL OF FIBROUS CYST FROM RIGHT BREAST  10+ YEARS  . RETINAL DETACHMENT SURGERY  10+ YEARS    LEFT EYE  . TOTAL HIP ARTHROPLASTY Right 03/06/2016  . TOTAL HIP ARTHROPLASTY Right 03/06/2016   Procedure: RIGHT TOTAL HIP ARTHROPLASTY ANTERIOR APPROACH;  Surgeon: Meredith Pel, MD;  Location: [facility];  Service: Orthopedics;  Laterality: Right;  . TRANSTHORACIC ECHOCARDIOGRAM  12/01/2009   EF 60-65%    REVIEW OF SYSTEMS:  A comprehensive review of systems was negative except for: Constitutional: positive for fatigue   PHYSICAL EXAMINATION: General appearance:  alert, cooperative, fatigued and no distress Head: Normocephalic, without obvious abnormality, atraumatic Neck: no adenopathy, no JVD, supple, symmetrical, trachea midline and thyroid not enlarged, symmetric, no tenderness/mass/nodules Lymph nodes: Cervical, supraclavicular, and axillary nodes normal. Resp: clear to auscultation bilaterally Back: symmetric, no curvature. ROM normal. No CVA tenderness. Cardio: regular rate and rhythm, S1, S2 normal, no murmur, click, rub or gallop GI: soft, non-tender; bowel sounds normal; no masses,  no organomegaly Extremities: extremities normal, atraumatic, no cyanosis or edema  ECOG PERFORMANCE STATUS: 1 - Symptomatic but completely ambulatory  Blood pressure (!) 142/58, pulse (!) 52, temperature 98.3 F (36.8 C), temperature source Oral, resp. rate 17, height 5\' 2"  (1.575 m), weight 195 lb 4.8 oz (88.6 kg), SpO2 97 %.  LABORATORY DATA: Lab Results  Component Value Date   WBC 5.1 11/21/2017   HGB 10.0 (L) 11/21/2017   HCT 31.9 (L) 11/21/2017   MCV 86.5 11/21/2017   PLT 238 11/21/2017      Chemistry      Component Value Date/Time   NA 143 08/08/2017 1113   NA 143 06/28/2017 1239   K 4.5 08/08/2017 1113   K 4.8 06/28/2017 1239   CL 114 (H) 08/08/2017 1113   CO2 21 (L) 08/08/2017 1113   CO2 22 06/28/2017 1239   BUN 68 (H) 08/08/2017 1113   BUN 48.6 (H) 06/28/2017 1239   CREATININE 3.31 (HH) 08/08/2017 1113   CREATININE 2.9 (H) 06/28/2017 1239      Component Value  Date/Time   CALCIUM 8.4 08/08/2017 1113   CALCIUM 8.3 (L) 06/28/2017 1239   ALKPHOS 75 08/08/2017 1113   ALKPHOS 86 06/28/2017 1239   AST 11 08/08/2017 1113   AST 11 06/28/2017 1239   ALT 4 08/08/2017 1113   ALT <6 06/28/2017 1239   BILITOT 0.4 08/08/2017 1113   BILITOT 0.28 06/28/2017 1239       RADIOGRAPHIC STUDIES: Mm Screening Breast Tomo Bilateral  Result Date: 10/24/2017 CLINICAL DATA:  Screening. EXAM: DIGITAL SCREENING BILATERAL MAMMOGRAM WITH TOMO AND CAD COMPARISON:  Previous exam(s). ACR Breast Density Category b: There are scattered areas of fibroglandular density. FINDINGS: There are no findings suspicious for malignancy. Images were processed with CAD. IMPRESSION: No mammographic evidence of malignancy. A result letter of this screening mammogram will be mailed directly to the patient. RECOMMENDATION: Screening mammogram in one year. (Code:SM-B-01Y) BI-RADS CATEGORY  1: Negative. Electronically Signed   By: Everlean Alstrom M.D.   On: 10/24/2017 09:20    ASSESSMENT AND PLAN:  This is a very pleasant 72 years old African-American female with anemia of chronic disease secondary to renal insufficiency plus/minus iron deficiency. The patient is currently on treatment with Aranesp 300 mcg every 3 weeks in addition to over-the-counter oral iron tablets.  Her CBC today showed a stable hemoglobin and hematocrit.  Iron study and ferritin are still pending. I discussed the lab results with the patient.  I recommended for her to continue her current treatment with Aranesp every 3 weeks in addition to the oral iron tablets. I will see her back for follow-up visit in in 3 months with repeat CBC, iron study and ferritin. For hypertension, she was advised to take her blood pressure medication as prescribed and to monitor it closely at home. She was advised to call immediately if she has any concerning symptoms in the interval. The patient voices understanding of current disease status and  treatment options and is in agreement with the current care plan. All questions were answered.  The patient knows to call the clinic with any problems, questions or concerns. We can certainly see the patient much sooner if necessary.  Disclaimer: This note was dictated with voice recognition software. Similar sounding words can inadvertently be transcribed and may not be corrected upon review.

## 2017-12-10 ENCOUNTER — Other Ambulatory Visit: Payer: Self-pay | Admitting: Internal Medicine

## 2017-12-12 ENCOUNTER — Other Ambulatory Visit: Payer: Self-pay | Admitting: Medical Oncology

## 2017-12-12 DIAGNOSIS — D638 Anemia in other chronic diseases classified elsewhere: Secondary | ICD-10-CM

## 2017-12-13 ENCOUNTER — Inpatient Hospital Stay: Payer: Medicare Other

## 2017-12-13 ENCOUNTER — Inpatient Hospital Stay: Payer: Medicare Other | Attending: Internal Medicine

## 2017-12-13 VITALS — BP 142/62 | HR 53 | Temp 98.2°F | Resp 18

## 2017-12-13 DIAGNOSIS — N184 Chronic kidney disease, stage 4 (severe): Secondary | ICD-10-CM

## 2017-12-13 DIAGNOSIS — D638 Anemia in other chronic diseases classified elsewhere: Secondary | ICD-10-CM

## 2017-12-13 DIAGNOSIS — I129 Hypertensive chronic kidney disease with stage 1 through stage 4 chronic kidney disease, or unspecified chronic kidney disease: Secondary | ICD-10-CM | POA: Diagnosis present

## 2017-12-13 DIAGNOSIS — Z79899 Other long term (current) drug therapy: Secondary | ICD-10-CM | POA: Diagnosis not present

## 2017-12-13 DIAGNOSIS — D631 Anemia in chronic kidney disease: Secondary | ICD-10-CM | POA: Insufficient documentation

## 2017-12-13 DIAGNOSIS — N183 Chronic kidney disease, stage 3 (moderate): Secondary | ICD-10-CM | POA: Insufficient documentation

## 2017-12-13 LAB — CBC WITH DIFFERENTIAL (CANCER CENTER ONLY)
BASOS PCT: 1 %
Basophils Absolute: 0 10*3/uL (ref 0.0–0.1)
EOS ABS: 0.3 10*3/uL (ref 0.0–0.5)
Eosinophils Relative: 7 %
HCT: 31.2 % — ABNORMAL LOW (ref 34.8–46.6)
HEMOGLOBIN: 9.6 g/dL — AB (ref 11.6–15.9)
Lymphocytes Relative: 11 %
Lymphs Abs: 0.4 10*3/uL — ABNORMAL LOW (ref 0.9–3.3)
MCH: 26.6 pg (ref 25.1–34.0)
MCHC: 30.9 g/dL — AB (ref 31.5–36.0)
MCV: 86.2 fL (ref 79.5–101.0)
MONO ABS: 0.3 10*3/uL (ref 0.1–0.9)
MONOS PCT: 7 %
NEUTROS PCT: 74 %
Neutro Abs: 3 10*3/uL (ref 1.5–6.5)
PLATELETS: 198 10*3/uL (ref 145–400)
RBC: 3.62 MIL/uL — ABNORMAL LOW (ref 3.70–5.45)
RDW: 19.9 % — AB (ref 11.2–14.5)
WBC Count: 4 10*3/uL (ref 3.9–10.3)

## 2017-12-13 MED ORDER — DARBEPOETIN ALFA 300 MCG/0.6ML IJ SOSY
300.0000 ug | PREFILLED_SYRINGE | Freq: Once | INTRAMUSCULAR | Status: AC
  Administered 2017-12-13: 300 ug via SUBCUTANEOUS

## 2017-12-13 NOTE — Patient Instructions (Signed)
Darbepoetin Alfa injection What is this medicine? DARBEPOETIN ALFA (dar be POE e tin AL fa) helps your body make more red blood cells. It is used to treat anemia caused by chronic kidney failure and chemotherapy. This medicine may be used for other purposes; ask your health care provider or pharmacist if you have questions. COMMON BRAND NAME(S): Aranesp What should I tell my health care provider before I take this medicine? They need to know if you have any of these conditions: -blood clotting disorders or history of blood clots -cancer patient not on chemotherapy -cystic fibrosis -heart disease, such as angina, heart failure, or a history of a heart attack -hemoglobin level of 12 g/dL or greater -high blood pressure -low levels of folate, iron, or vitamin B12 -seizures -an unusual or allergic reaction to darbepoetin, erythropoietin, albumin, hamster proteins, latex, other medicines, foods, dyes, or preservatives -pregnant or trying to get pregnant -breast-feeding How should I use this medicine? This medicine is for injection into a vein or under the skin. It is usually given by a health care professional in a hospital or clinic setting. If you get this medicine at home, you will be taught how to prepare and give this medicine. Use exactly as directed. Take your medicine at regular intervals. Do not take your medicine more often than directed. It is important that you put your used needles and syringes in a special sharps container. Do not put them in a trash can. If you do not have a sharps container, call your pharmacist or healthcare provider to get one. A special MedGuide will be given to you by the pharmacist with each prescription and refill. Be sure to read this information carefully each time. Talk to your pediatrician regarding the use of this medicine in children. While this medicine may be used in children as young as 1 year for selected conditions, precautions do  apply. Overdosage: If you think you have taken too much of this medicine contact a poison control center or emergency room at once. NOTE: This medicine is only for you. Do not share this medicine with others. What if I miss a dose? If you miss a dose, take it as soon as you can. If it is almost time for your next dose, take only that dose. Do not take double or extra doses. What may interact with this medicine? Do not take this medicine with any of the following medications: -epoetin alfa This list may not describe all possible interactions. Give your health care provider a list of all the medicines, herbs, non-prescription drugs, or dietary supplements you use. Also tell them if you smoke, drink alcohol, or use illegal drugs. Some items may interact with your medicine. What should I watch for while using this medicine? Your condition will be monitored carefully while you are receiving this medicine. You may need blood work done while you are taking this medicine. What side effects may I notice from receiving this medicine? Side effects that you should report to your doctor or health care professional as soon as possible: -allergic reactions like skin rash, itching or hives, swelling of the face, lips, or tongue -breathing problems -changes in vision -chest pain -confusion, trouble speaking or understanding -feeling faint or lightheaded, falls -high blood pressure -muscle aches or pains -pain, swelling, warmth in the leg -rapid weight gain -severe headaches -sudden numbness or weakness of the face, arm or leg -trouble walking, dizziness, loss of balance or coordination -seizures (convulsions) -swelling of the ankles, feet, hands -  unusually weak or tired Side effects that usually do not require medical attention (report to your doctor or health care professional if they continue or are bothersome): -diarrhea -fever, chills (flu-like symptoms) -headaches -nausea, vomiting -redness,  stinging, or swelling at site where injected This list may not describe all possible side effects. Call your doctor for medical advice about side effects. You may report side effects to FDA at 1-800-FDA-1088. Where should I keep my medicine? Keep out of the reach of children. Store in a refrigerator between 2 and 8 degrees C (36 and 46 degrees F). Do not freeze. Do not shake. Throw away any unused portion if using a single-dose vial. Throw away any unused medicine after the expiration date. NOTE: This sheet is a summary. It may not cover all possible information. If you have questions about this medicine, talk to your doctor, pharmacist, or health care provider.  2018 Elsevier/Gold Standard (2016-02-13 19:52:26)  Iron-Rich Diet Iron is a mineral that helps your body to produce hemoglobin. Hemoglobin is a protein in your red blood cells that carries oxygen to your body's tissues. Eating too little iron may cause you to feel weak and tired, and it can increase your risk for infection. Eating enough iron is necessary for your body's metabolism, muscle function, and nervous system. Iron is naturally found in many foods. It can also be added to foods or fortified in foods. There are two types of dietary iron:  Heme iron. Heme iron is absorbed by the body more easily than nonheme iron. Heme iron is found in meat, poultry, and fish.  Nonheme iron. Nonheme iron is found in dietary supplements, iron-fortified grains, beans, and vegetables.  You may need to follow an iron-rich diet if:  You have been diagnosed with iron deficiency or iron-deficiency anemia.  You have a condition that prevents you from absorbing dietary iron, such as: ? Infection in your intestines. ? Celiac disease. This involves long-lasting (chronic) inflammation of your intestines.  You do not eat enough iron.  You eat a diet that is high in foods that impair iron absorption.  You have lost a lot of blood.  You have heavy  bleeding during your menstrual cycle.  You are pregnant.  What is my plan? Your health care provider may help you to determine how much iron you need per day based on your condition. Generally, when a person consumes sufficient amounts of iron in the diet, the following iron needs are met:  Men. ? 14-18 years old: 11 mg per day. ? 19-50 years old: 8 mg per day.  Women. ? 14-18 years old: 15 mg per day. ? 19-50 years old: 18 mg per day. ? Over 50 years old: 8 mg per day. ? Pregnant women: 27 mg per day. ? Breastfeeding women: 9 mg per day.  What do I need to know about an iron-rich diet?  Eat fresh fruits and vegetables that are high in vitamin C along with foods that are high in iron. This will help increase the amount of iron that your body absorbs from food, especially with foods containing nonheme iron. Foods that are high in vitamin C include oranges, peppers, tomatoes, and mango.  Take iron supplements only as directed by your health care provider. Overdose of iron can be life-threatening. If you were prescribed iron supplements, take them with orange juice or a vitamin C supplement.  Cook foods in pots and pans that are made from iron.  Eat nonheme iron-containing foods alongside foods   that are high in heme iron. This helps to improve your iron absorption.  Certain foods and drinks contain compounds that impair iron absorption. Avoid eating these foods in the same meal as iron-rich foods or with iron supplements. These include: ? Coffee, black tea, and red wine. ? Milk, dairy products, and foods that are high in calcium. ? Beans, soybeans, and peas. ? Whole grains.  When eating foods that contain both nonheme iron and compounds that impair iron absorption, follow these tips to absorb iron better. ? Soak beans overnight before cooking. ? Soak whole grains overnight and drain them before using. ? Ferment flours before baking, such as using yeast in bread dough. What foods  can I eat? Grains Iron-fortified breakfast cereal. Iron-fortified whole-wheat bread. Enriched rice. Sprouted grains. Vegetables Spinach. Potatoes with skin. Green peas. Broccoli. Red and green bell peppers. Fermented vegetables. Fruits Prunes. Raisins. Oranges. Strawberries. Mango. Grapefruit. Meats and Other Protein Sources Beef liver. Oysters. Beef. Shrimp. [place]. Chicken. [place]. Sardines. Chickpeas. Nuts. Tofu. Beverages Tomato juice. Fresh orange juice. Prune juice. Hibiscus tea. Fortified instant breakfast shakes. Condiments Tahini. Fermented soy sauce. Sweets and Desserts Black-strap molasses. Other Wheat germ. The items listed above may not be a complete list of recommended foods or beverages. Contact your dietitian for more options. What foods are not recommended? Grains Whole grains. Bran cereal. Bran flour. Oats. Vegetables Artichokes. Brussels sprouts. Kale. Fruits Blueberries. Raspberries. Strawberries. Figs. Meats and Other Protein Sources Soybeans. Products made from soy protein. Dairy Milk. Cream. Cheese. Yogurt. Cottage cheese. Beverages Coffee. Black tea. Red wine. Sweets and Desserts Cocoa. Chocolate. Ice cream. Other Basil. Oregano. Parsley. The items listed above may not be a complete list of foods and beverages to avoid. Contact your dietitian for more information. This information is not intended to replace advice given to you by your health care provider. Make sure you discuss any questions you have with your health care provider. Document Released: 02/06/2005 Document Revised: 01/13/2016 Document Reviewed: 01/20/2014 Elsevier Interactive Patient Education  [employer].

## 2018-01-01 ENCOUNTER — Other Ambulatory Visit: Payer: Self-pay | Admitting: Medical Oncology

## 2018-01-01 DIAGNOSIS — D638 Anemia in other chronic diseases classified elsewhere: Secondary | ICD-10-CM

## 2018-01-02 ENCOUNTER — Inpatient Hospital Stay: Payer: Medicare Other

## 2018-01-02 VITALS — BP 142/58 | HR 65 | Temp 98.5°F

## 2018-01-02 DIAGNOSIS — D638 Anemia in other chronic diseases classified elsewhere: Secondary | ICD-10-CM

## 2018-01-02 DIAGNOSIS — I129 Hypertensive chronic kidney disease with stage 1 through stage 4 chronic kidney disease, or unspecified chronic kidney disease: Secondary | ICD-10-CM | POA: Diagnosis not present

## 2018-01-02 DIAGNOSIS — N184 Chronic kidney disease, stage 4 (severe): Secondary | ICD-10-CM

## 2018-01-02 LAB — CBC WITH DIFFERENTIAL (CANCER CENTER ONLY)
BASOS ABS: 0 10*3/uL (ref 0.0–0.1)
Basophils Relative: 1 %
EOS PCT: 5 %
Eosinophils Absolute: 0.2 10*3/uL (ref 0.0–0.5)
HEMATOCRIT: 31.3 % — AB (ref 34.8–46.6)
Hemoglobin: 9.9 g/dL — ABNORMAL LOW (ref 11.6–15.9)
LYMPHS PCT: 10 %
Lymphs Abs: 0.5 10*3/uL — ABNORMAL LOW (ref 0.9–3.3)
MCH: 27.6 pg (ref 25.1–34.0)
MCHC: 31.6 g/dL (ref 31.5–36.0)
MCV: 87.3 fL (ref 79.5–101.0)
MONO ABS: 0.4 10*3/uL (ref 0.1–0.9)
MONOS PCT: 8 %
Neutro Abs: 3.6 10*3/uL (ref 1.5–6.5)
Neutrophils Relative %: 76 %
PLATELETS: 182 10*3/uL (ref 145–400)
RBC: 3.59 MIL/uL — ABNORMAL LOW (ref 3.70–5.45)
RDW: 21.8 % — AB (ref 11.2–14.5)
WBC Count: 4.8 10*3/uL (ref 3.9–10.3)

## 2018-01-02 MED ORDER — DARBEPOETIN ALFA 300 MCG/0.6ML IJ SOSY
300.0000 ug | PREFILLED_SYRINGE | Freq: Once | INTRAMUSCULAR | Status: AC
  Administered 2018-01-02: 300 ug via SUBCUTANEOUS

## 2018-01-02 NOTE — Patient Instructions (Signed)
Darbepoetin Alfa injection What is this medicine? DARBEPOETIN ALFA (dar be POE e tin AL fa) helps your body make more red blood cells. It is used to treat anemia caused by chronic kidney failure and chemotherapy. This medicine may be used for other purposes; ask your health care provider or pharmacist if you have questions. COMMON BRAND NAME(S): Aranesp What should I tell my health care provider before I take this medicine? They need to know if you have any of these conditions: -blood clotting disorders or history of blood clots -cancer patient not on chemotherapy -cystic fibrosis -heart disease, such as angina, heart failure, or a history of a heart attack -hemoglobin level of 12 g/dL or greater -high blood pressure -low levels of folate, iron, or vitamin B12 -seizures -an unusual or allergic reaction to darbepoetin, erythropoietin, albumin, hamster proteins, latex, other medicines, foods, dyes, or preservatives -pregnant or trying to get pregnant -breast-feeding How should I use this medicine? This medicine is for injection into a vein or under the skin. It is usually given by a health care professional in a hospital or clinic setting. If you get this medicine at home, you will be taught how to prepare and give this medicine. Use exactly as directed. Take your medicine at regular intervals. Do not take your medicine more often than directed. It is important that you put your used needles and syringes in a special sharps container. Do not put them in a trash can. If you do not have a sharps container, call your pharmacist or healthcare provider to get one. A special MedGuide will be given to you by the pharmacist with each prescription and refill. Be sure to read this information carefully each time. Talk to your pediatrician regarding the use of this medicine in children. While this medicine may be used in children as young as 1 year for selected conditions, precautions do  apply. Overdosage: If you think you have taken too much of this medicine contact a poison control center or emergency room at once. NOTE: This medicine is only for you. Do not share this medicine with others. What if I miss a dose? If you miss a dose, take it as soon as you can. If it is almost time for your next dose, take only that dose. Do not take double or extra doses. What may interact with this medicine? Do not take this medicine with any of the following medications: -epoetin alfa This list may not describe all possible interactions. Give your health care provider a list of all the medicines, herbs, non-prescription drugs, or dietary supplements you use. Also tell them if you smoke, drink alcohol, or use illegal drugs. Some items may interact with your medicine. What should I watch for while using this medicine? Your condition will be monitored carefully while you are receiving this medicine. You may need blood work done while you are taking this medicine. What side effects may I notice from receiving this medicine? Side effects that you should report to your doctor or health care professional as soon as possible: -allergic reactions like skin rash, itching or hives, swelling of the face, lips, or tongue -breathing problems -changes in vision -chest pain -confusion, trouble speaking or understanding -feeling faint or lightheaded, falls -high blood pressure -muscle aches or pains -pain, swelling, warmth in the leg -rapid weight gain -severe headaches -sudden numbness or weakness of the face, arm or leg -trouble walking, dizziness, loss of balance or coordination -seizures (convulsions) -swelling of the ankles, feet, hands -  unusually weak or tired Side effects that usually do not require medical attention (report to your doctor or health care professional if they continue or are bothersome): -diarrhea -fever, chills (flu-like symptoms) -headaches -nausea, vomiting -redness,  stinging, or swelling at site where injected This list may not describe all possible side effects. Call your doctor for medical advice about side effects. You may report side effects to FDA at 1-800-FDA-1088. Where should I keep my medicine? Keep out of the reach of children. Store in a refrigerator between 2 and 8 degrees C (36 and 46 degrees F). Do not freeze. Do not shake. Throw away any unused portion if using a single-dose vial. Throw away any unused medicine after the expiration date. NOTE: This sheet is a summary. It may not cover all possible information. If you have questions about this medicine, talk to your doctor, pharmacist, or health care provider.  2018 Elsevier/Gold Standard (2016-02-13 19:52:26)  Iron-Rich Diet Iron is a mineral that helps your body to produce hemoglobin. Hemoglobin is a protein in your red blood cells that carries oxygen to your body's tissues. Eating too little iron may cause you to feel weak and tired, and it can increase your risk for infection. Eating enough iron is necessary for your body's metabolism, muscle function, and nervous system. Iron is naturally found in many foods. It can also be added to foods or fortified in foods. There are two types of dietary iron:  Heme iron. Heme iron is absorbed by the body more easily than nonheme iron. Heme iron is found in meat, poultry, and fish.  Nonheme iron. Nonheme iron is found in dietary supplements, iron-fortified grains, beans, and vegetables.  You may need to follow an iron-rich diet if:  You have been diagnosed with iron deficiency or iron-deficiency anemia.  You have a condition that prevents you from absorbing dietary iron, such as: ? Infection in your intestines. ? Celiac disease. This involves long-lasting (chronic) inflammation of your intestines.  You do not eat enough iron.  You eat a diet that is high in foods that impair iron absorption.  You have lost a lot of blood.  You have heavy  bleeding during your menstrual cycle.  You are pregnant.  What is my plan? Your health care provider may help you to determine how much iron you need per day based on your condition. Generally, when a person consumes sufficient amounts of iron in the diet, the following iron needs are met:  Men. ? 14-18 years old: 11 mg per day. ? 19-50 years old: 8 mg per day.  Women. ? 14-18 years old: 15 mg per day. ? 19-50 years old: 18 mg per day. ? Over 50 years old: 8 mg per day. ? Pregnant women: 27 mg per day. ? Breastfeeding women: 9 mg per day.  What do I need to know about an iron-rich diet?  Eat fresh fruits and vegetables that are high in vitamin C along with foods that are high in iron. This will help increase the amount of iron that your body absorbs from food, especially with foods containing nonheme iron. Foods that are high in vitamin C include oranges, peppers, tomatoes, and mango.  Take iron supplements only as directed by your health care provider. Overdose of iron can be life-threatening. If you were prescribed iron supplements, take them with orange juice or a vitamin C supplement.  Cook foods in pots and pans that are made from iron.  Eat nonheme iron-containing foods alongside foods   that are high in heme iron. This helps to improve your iron absorption.  Certain foods and drinks contain compounds that impair iron absorption. Avoid eating these foods in the same meal as iron-rich foods or with iron supplements. These include: ? Coffee, black tea, and red wine. ? Milk, dairy products, and foods that are high in calcium. ? Beans, soybeans, and peas. ? Whole grains.  When eating foods that contain both nonheme iron and compounds that impair iron absorption, follow these tips to absorb iron better. ? Soak beans overnight before cooking. ? Soak whole grains overnight and drain them before using. ? Ferment flours before baking, such as using yeast in bread dough. What foods  can I eat? Grains Iron-fortified breakfast cereal. Iron-fortified whole-wheat bread. Enriched rice. Sprouted grains. Vegetables Spinach. Potatoes with skin. Green peas. Broccoli. Red and green bell peppers. Fermented vegetables. Fruits Prunes. Raisins. Oranges. Strawberries. Mango. Grapefruit. Meats and Other Protein Sources Beef liver. Oysters. Beef. Shrimp. [place]. Chicken. Festus. Sardines. Chickpeas. Nuts. Tofu. Beverages Tomato juice. Fresh orange juice. Prune juice. Hibiscus tea. Fortified instant breakfast shakes. Condiments Tahini. Fermented soy sauce. Sweets and Desserts Black-strap molasses. Other Wheat germ. The items listed above may not be a complete list of recommended foods or beverages. Contact your dietitian for more options. What foods are not recommended? Grains Whole grains. Bran cereal. Bran flour. Oats. Vegetables Artichokes. Brussels sprouts. Kale. Fruits Blueberries. Raspberries. Strawberries. Figs. Meats and Other Protein Sources Soybeans. Products made from soy protein. Dairy Milk. Cream. Cheese. Yogurt. Cottage cheese. Beverages Coffee. Black tea. Red wine. Sweets and Desserts Cocoa. Chocolate. Ice cream. Other Basil. Oregano. Parsley. The items listed above may not be a complete list of foods and beverages to avoid. Contact your dietitian for more information. This information is not intended to replace advice given to you by your health care provider. Make sure you discuss any questions you have with your health care provider. Document Released: 02/06/2005 Document Revised: 01/13/2016 Document Reviewed: 01/20/2014 Elsevier Interactive Patient Education  [employer].

## 2018-01-06 ENCOUNTER — Other Ambulatory Visit: Payer: Self-pay

## 2018-01-06 DIAGNOSIS — N184 Chronic kidney disease, stage 4 (severe): Secondary | ICD-10-CM

## 2018-01-06 DIAGNOSIS — Z01812 Encounter for preprocedural laboratory examination: Secondary | ICD-10-CM

## 2018-01-08 ENCOUNTER — Encounter (HOSPITAL_COMMUNITY): Payer: Medicare Other

## 2018-01-08 ENCOUNTER — Other Ambulatory Visit (HOSPITAL_COMMUNITY): Payer: Medicare Other

## 2018-01-10 ENCOUNTER — Ambulatory Visit (HOSPITAL_COMMUNITY)
Admission: RE | Admit: 2018-01-10 | Discharge: 2018-01-10 | Disposition: A | Discharge status: Home / Self Care | Payer: Medicare Other | Source: Ambulatory Visit | Attending: Vascular Surgery | Admitting: Vascular Surgery

## 2018-01-10 ENCOUNTER — Ambulatory Visit (INDEPENDENT_AMBULATORY_CARE_PROVIDER_SITE_OTHER)
Admission: RE | Admit: 2018-01-10 | Discharge: 2018-01-10 | Disposition: A | Discharge status: Home / Self Care | Payer: Medicare Other | Source: Ambulatory Visit | Attending: Vascular Surgery | Admitting: Vascular Surgery

## 2018-01-10 DIAGNOSIS — Z01812 Encounter for preprocedural laboratory examination: Secondary | ICD-10-CM | POA: Diagnosis present

## 2018-01-10 DIAGNOSIS — N184 Chronic kidney disease, stage 4 (severe): Secondary | ICD-10-CM | POA: Diagnosis not present

## 2018-01-16 ENCOUNTER — Ambulatory Visit (INDEPENDENT_AMBULATORY_CARE_PROVIDER_SITE_OTHER): Payer: Medicare Other | Admitting: Vascular Surgery

## 2018-01-16 ENCOUNTER — Encounter: Payer: Self-pay | Admitting: *Deleted

## 2018-01-16 ENCOUNTER — Other Ambulatory Visit: Payer: Self-pay

## 2018-01-16 ENCOUNTER — Encounter: Payer: Self-pay | Admitting: Vascular Surgery

## 2018-01-16 ENCOUNTER — Other Ambulatory Visit: Payer: Self-pay | Admitting: *Deleted

## 2018-01-16 VITALS — BP 160/84 | HR 57 | Temp 98.9°F | Resp 20 | Ht 62.0 in | Wt 178.0 lb

## 2018-01-16 DIAGNOSIS — N185 Chronic kidney disease, stage 5: Secondary | ICD-10-CM | POA: Diagnosis not present

## 2018-01-16 NOTE — Progress Notes (Signed)
Referring Physician: Dr. Erling Cruz  Patient name: Norma [name] MRN: [id number] DOB: 02-24-1946 Sex: female  REASON FOR CONSULT: Dialysis access  HPI: Norma [name] is a 72 y.o. female referred for placement of a long term hemodialysis access.  Patient is right-handed.  She is not currently on hemodialysis.  He has not had any prior access procedures.  She is CKD 5 presumably secondary to diabetes.  Other chronic medical problems include coronary artery disease diabetes anemia all of which are currently stable.  Past Medical History:  Diagnosis Date  . Anxiety   . Carotid artery occlusion   . CKD (chronic kidney disease) stage 3, GFR 30-59 ml/min (HCC) 04/18/2014  . Claudication ([place])   . Constipation    Due to patient taking iron supplements  . Coronary artery disease   . DDD (degenerative disc disease)   . Diabetes mellitus   . Diabetic coma ([place])   . DJD (degenerative joint disease)   . DJD (degenerative joint disease)   . Edema    Bilateral lower extermities  . Gout   . Hyperlipidemia   . Hypertension   . Iron deficiency anemia   . Leg pain   . Obesity   . PAD (peripheral artery disease) ([place])   . Shortness of breath    exertion   Past Surgical History:  Procedure Laterality Date  . AORTA - BILATERAL FEMORAL ARTERY BYPASS GRAFT  05/25/2011   Procedure: AORTA BIFEMORAL BYPASS GRAFT;  Surgeon: Mal Misty, MD;  Location: [place];  Service: Vascular;  Laterality: N/A;  . APPLICATION OF WOUND VAC Right 04/23/2016   Procedure: APPLICATION OF WOUND VAC CHANGED;  Surgeon: Meredith Pel, MD;  Location: [facility];  Service: Orthopedics;  Laterality: Right;  . BREAST EXCISIONAL BIOPSY Right   . BREAST SURGERY    . CARDIAC CATHETERIZATION  12/01/2009   EF 65%  . CARDIOVASCULAR STRESS TEST  11/28/2009   EF 75%  . COLONOSCOPY W/ POLYPECTOMY    . CORONARY ARTERY BYPASS GRAFT  12/08/2009   LIMA GRAFT TO THE DISTAL LAD AND SAPHENOUS VEIN GRAFT TO THE OBTUSE MARGINAL VESSEL   . EYE SURGERY    . I&D EXTREMITY Right 04/23/2016   Procedure: IRRIGATION AND DEBRIDEMENT EXTREMITY;  Surgeon: Meredith Pel, MD;  Location: [place];  Service: Orthopedics;  Laterality: Right;  . INCISION AND DRAINAGE HIP Right 04/20/2016   Procedure: IRRIGATION AND DEBRIDEMENT HIP WITH POLY EXCHANGE, ABX BEAD PLACEMENT, WOUND VAC ;  Surgeon: Meredith Pel, MD;  Location: [facility];  Service: Orthopedics;  Laterality: Right;  RIGHT HIP SUPERFICIAL I&D, POSSIBLE DEEP I&D, LINER EXCHANGE, ABX BEAD PLACEMENT, WOUND VAC.   Marland Kitchen PR VEIN BYPASS GRAFT,AORTO-FEM-POP  05/25/11  . REMOVAL OF FIBROUS CYST FROM RIGHT BREAST  10+ YEARS  . RETINAL DETACHMENT SURGERY  10+ YEARS   LEFT EYE  . TOTAL HIP ARTHROPLASTY Right 03/06/2016  . TOTAL HIP ARTHROPLASTY Right 03/06/2016   Procedure: RIGHT TOTAL HIP ARTHROPLASTY ANTERIOR APPROACH;  Surgeon: Meredith Pel, MD;  Location: [facility];  Service: Orthopedics;  Laterality: Right;  . TRANSTHORACIC ECHOCARDIOGRAM  12/01/2009   EF 60-65%    Family History  Problem Relation Age of Onset  . Hypertension Mother   . Coronary artery disease Father   . Hypertension Sister   . Cirrhosis Brother   . Kidney disease Daughter   . Breast cancer Neg Hx     SOCIAL HISTORY: Social History   Socioeconomic History  .  Marital status: Single    Spouse name: Not on file  . Number of children: 2  . Years of education: Not on file  . Highest education level: Not on file  Occupational History  . 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 Financial resource strain: Not on file  . Food insecurity:    Worry: Not on file    Inability: Not on file  . Transportation needs:    Medical: Not on file    Non-medical: Not on file  Tobacco Use  . Smoking status: Former Smoker    Years: 20.00    Types: Cigarettes    Last attempt to quit: 07/10/1991    Years since quitting: 26.5  . Smokeless tobacco: Never Used  Substance and Sexual  Activity  . Alcohol use: No  . Drug use: No  . Sexual activity: Never  Lifestyle  . Physical activity:    Days per week: Not on file    Minutes per session: Not on file  . Stress: Not on file  Relationships  . Social connections:    Talks on phone: Not on file    Gets together: Not on file    Attends religious service: Not on file    Active member of club or organization: Not on file    Attends meetings of clubs or organizations: Not on file    Relationship status: Not on file  . Intimate partner violence:    Fear of current or ex partner: Not on file    Emotionally abused: Not on file    Physically abused: Not on file    Forced sexual activity: Not on file  Other Topics Concern  . Not on file  Social History Narrative  . Not on file    Allergies  Allergen Reactions  . Sulfasalazine   . Iohexol Itching and Rash     Code: RASH, Desc: pt called 1 day post scanning stating that skin was red and "itching all over" some what better but still had symptom.. instructed pt to take benadryl to relieve symptoms,per dr Alvester Chou.if any problems call back/mms, Onset Date: [id number]   . Sulfa Antibiotics Other (See Comments)    UNSPECIFIED REACTION     Current Outpatient Medications  Medication Sig Dispense Refill  . acetaminophen (TYLENOL) 325 MG tablet Take by mouth.    Marland Kitchen allopurinol (ZYLOPRIM) 100 MG tablet Take 100 mg by mouth daily as needed.    Marland Kitchen aspirin EC 325 MG EC tablet Take 1 tablet (325 mg total) by mouth daily with breakfast. 30 tablet 0  . atorvastatin (LIPITOR) 80 MG tablet Take 1 tablet (80 mg total) by mouth daily. (Patient taking differently: Take 80 mg by mouth every morning. ) 30 tablet 6  . carvedilol (COREG) 12.5 MG tablet Take 1 tablet (12.5 mg total) by mouth 2 (two) times daily. 180 tablet 3  . cholecalciferol (VITAMIN D) 1000 units tablet Take 1,000 Units by mouth daily as needed (supplement).    . cloNIDine (CATAPRES) 0.3 MG tablet Take 0.3 mg by mouth 2 (two)  times daily.     . Coenzyme Q10 200 MG capsule Take 200 mg by mouth every morning.     . colesevelam (WELCHOL) 625 MG tablet Take by mouth.    . DARBEPOETIN ALFA IJ Inject as directed.    . ferrous sulfate 325 (65 FE) MG tablet Take 325 mg by mouth daily with breakfast.    .  furosemide (LASIX) 80 MG tablet Take 160 mg by mouth daily.     Marland Kitchen glucose blood test strip 1.8 each by Other route as needed for other. Use as instructed    . hydrALAZINE (APRESOLINE) 50 MG tablet Take 50 mg by mouth 3 (three) times daily.    Marland Kitchen HYDROcodone-acetaminophen (NORCO/VICODIN) 5-325 MG tablet Take 1 tablet by mouth every 6 (six) hours as needed for moderate pain. 60 tablet 0  . insulin glargine (LANTUS) 100 UNIT/ML injection Inject 0.2 mLs (20 Units total) into the skin 2 (two) times daily. 10 mL 11  . isosorbide dinitrate (ISORDIL) 20 MG tablet Take 40 mg by mouth 2 (two) times daily.     . Liraglutide (VICTOZA [place]) Inject 1.8 mg into the skin daily.    . Magnesium 250 MG TABS Take 250 mg by mouth every morning.     Marland Kitchen OVER THE COUNTER MEDICATION Over the counter laxative from [facility]    . Probiotic Product (PROBIOTIC DAILY PO) Take 1 tablet by mouth daily as needed (supplement).     . triamcinolone ointment (KENALOG) 0.5 % Apply topically.    . vitamin B-12 (CYANOCOBALAMIN) 500 MCG tablet Take 500 mcg by mouth every morning.      No current facility-administered medications for this visit.     ROS:   General:  No weight loss, Fever, chills  HEENT: No recent headaches, no nasal bleeding, no visual changes, no sore throat  Neurologic: No dizziness, blackouts, seizures. No recent symptoms of stroke or mini- stroke. No recent episodes of slurred speech, or temporary blindness.  Cardiac: No recent episodes of chest pain/pressure, no shortness of breath at rest.  + shortness of breath with exertion.  Denies history of atrial fibrillation or irregular heartbeat  Vascular: No history of rest pain in feet.  No  history of claudication.  No history of non-healing ulcer, No history of DVT   Pulmonary: No home oxygen, no productive cough, no hemoptysis,  No asthma or wheezing  Musculoskeletal:  [ ]  Arthritis, [ ]  Low back pain,  [ ]  Joint pain  Hematologic:No history of hypercoagulable state.  No history of easy bleeding.  No history of anemia  Gastrointestinal: No hematochezia or melena,  No gastroesophageal reflux, no trouble swallowing  Urinary: [X]  chronic Kidney disease, [ ]  on HD - [ ]  MWF or [ ]  TTHS, [ ]  Burning with urination, [ ]  Frequent urination, [ ]  Difficulty urinating;   Skin: No rashes  Psychological: No history of anxiety,  No history of depression   Physical Examination  Vitals:   01/16/18 1533  BP: (!) 160/84  Pulse: (!) 57  Resp: 20  Temp: 98.9 F (37.2 C)  TempSrc: Oral  SpO2: 97%  Weight: 178 lb (80.7 kg)  Height: 5\' 2"  (1.575 m)    Body mass index is 32.56 kg/m.  General:  Alert and oriented, no acute distress HEENT: Normal Neck: No bruit or JVD Pulmonary: Clear to auscultation bilaterally Cardiac: Regular Rate and Rhythm without murmur Abdomen: Soft, non-tender, non-distended, no mass Skin: No rash Extremity Pulses:  2+ radial, brachial,pulses bilaterally Musculoskeletal: No deformity or edema  Neurologic: Upper and lower extremity motor 5/5 and symmetric  DATA:  She had a venous duplex of her upper extremities today which showed the vein diameter was greater than 3 mm cephalic and basilic bilaterally.  The radial artery was quite small less than 2 mm.  Brachial artery was normal caliber.  ASSESSMENT: Patient needs long-term hemodialysis access.  PLAN: Left brachiocephalic AV fistula scheduled for January 22, 2018 risk benefits possible complications and procedure details including but not limited to bleeding infection ischemic steal non-maturation of the fistula were discussed with the patient today.  She understands and agrees to proceed.  Ruta Hinds, MD [facility] Office: [phone] Pager: [phone]   Ruta Hinds, MD [facility]: [phone] Pager: [phone]

## 2018-01-22 ENCOUNTER — Ambulatory Visit (HOSPITAL_COMMUNITY): Admission: RE | Admit: 2018-01-22 | Payer: Medicare Other | Source: Ambulatory Visit | Admitting: Vascular Surgery

## 2018-01-22 ENCOUNTER — Encounter (HOSPITAL_COMMUNITY): Admission: RE | Payer: Self-pay | Source: Ambulatory Visit

## 2018-01-22 SURGERY — ARTERIOVENOUS (AV) FISTULA CREATION
Anesthesia: Monitor Anesthesia Care | Laterality: Left

## 2018-01-23 ENCOUNTER — Other Ambulatory Visit: Payer: Self-pay | Admitting: *Deleted

## 2018-01-23 ENCOUNTER — Inpatient Hospital Stay: Payer: Medicare Other

## 2018-01-23 ENCOUNTER — Inpatient Hospital Stay: Payer: Medicare Other | Attending: Internal Medicine

## 2018-01-23 DIAGNOSIS — D631 Anemia in chronic kidney disease: Secondary | ICD-10-CM | POA: Insufficient documentation

## 2018-01-23 DIAGNOSIS — N183 Chronic kidney disease, stage 3 (moderate): Secondary | ICD-10-CM | POA: Diagnosis not present

## 2018-01-23 DIAGNOSIS — D638 Anemia in other chronic diseases classified elsewhere: Secondary | ICD-10-CM

## 2018-01-23 LAB — CBC WITH DIFFERENTIAL (CANCER CENTER ONLY)
Basophils Absolute: 0.1 10*3/uL (ref 0.0–0.1)
Basophils Relative: 1 %
EOS PCT: 4 %
Eosinophils Absolute: 0.2 10*3/uL (ref 0.0–0.5)
HEMATOCRIT: 34.9 % (ref 34.8–46.6)
Hemoglobin: 11 g/dL — ABNORMAL LOW (ref 11.6–15.9)
LYMPHS ABS: 0.5 10*3/uL — AB (ref 0.9–3.3)
LYMPHS PCT: 12 %
MCH: 27.1 pg (ref 25.1–34.0)
MCHC: 31.4 g/dL — AB (ref 31.5–36.0)
MCV: 86.3 fL (ref 79.5–101.0)
MONO ABS: 0.3 10*3/uL (ref 0.1–0.9)
MONOS PCT: 7 %
Neutro Abs: 3.4 10*3/uL (ref 1.5–6.5)
Neutrophils Relative %: 76 %
PLATELETS: 196 10*3/uL (ref 145–400)
RBC: 4.04 MIL/uL (ref 3.70–5.45)
RDW: 19.9 % — AB (ref 11.2–14.5)
WBC Count: 4.4 10*3/uL (ref 3.9–10.3)

## 2018-01-23 LAB — CMP (CANCER CENTER ONLY)
ALT: 8 U/L (ref 0–44)
AST: 11 U/L — ABNORMAL LOW (ref 15–41)
Albumin: 2.4 g/dL — ABNORMAL LOW (ref 3.5–5.0)
Alkaline Phosphatase: 74 U/L (ref 38–126)
Anion gap: 11 (ref 5–15)
BUN: 78 mg/dL — AB (ref 8–23)
CHLORIDE: 103 mmol/L (ref 98–111)
CO2: 27 mmol/L (ref 22–32)
CREATININE: 5.78 mg/dL — AB (ref 0.44–1.00)
Calcium: 8.4 mg/dL — ABNORMAL LOW (ref 8.9–10.3)
GFR, EST AFRICAN AMERICAN: 8 mL/min — AB (ref >60)
GFR, EST NON AFRICAN AMERICAN: 7 mL/min — AB (ref >60)
Glucose, Bld: 168 mg/dL — ABNORMAL HIGH (ref 70–99)
POTASSIUM: 4.3 mmol/L (ref 3.5–5.1)
Sodium: 141 mmol/L (ref 135–145)
TOTAL PROTEIN: 6 g/dL — AB (ref 6.5–8.1)
Total Bilirubin: 0.4 mg/dL (ref 0.3–1.2)

## 2018-01-23 LAB — IRON AND TIBC
Iron: 67 ug/dL (ref 41–142)
Saturation Ratios: 35 % (ref 21–57)
TIBC: 190 ug/dL — ABNORMAL LOW (ref 236–444)
UIBC: 123 ug/dL

## 2018-01-23 LAB — FERRITIN: Ferritin: 184 ng/mL (ref 11–307)

## 2018-01-23 MED ORDER — DARBEPOETIN ALFA 300 MCG/0.6ML IJ SOSY
PREFILLED_SYRINGE | INTRAMUSCULAR | Status: AC
Start: 2018-01-23 — End: ?
  Filled 2018-01-23: qty 0.6

## 2018-01-23 NOTE — Progress Notes (Signed)
Pt did not meet parameters to receive injection. Hgb 11. Lab results were given to pt. Allyson Tineo LPN

## 2018-02-13 ENCOUNTER — Other Ambulatory Visit: Payer: Self-pay | Admitting: *Deleted

## 2018-02-13 ENCOUNTER — Encounter: Payer: Self-pay | Admitting: Internal Medicine

## 2018-02-13 ENCOUNTER — Telehealth: Payer: Self-pay | Admitting: Internal Medicine

## 2018-02-13 ENCOUNTER — Inpatient Hospital Stay: Payer: Medicare Other | Attending: Internal Medicine

## 2018-02-13 ENCOUNTER — Inpatient Hospital Stay (HOSPITAL_BASED_OUTPATIENT_CLINIC_OR_DEPARTMENT_OTHER): Payer: Medicare Other | Admitting: Internal Medicine

## 2018-02-13 ENCOUNTER — Inpatient Hospital Stay: Payer: Medicare Other

## 2018-02-13 VITALS — BP 145/45 | HR 47 | Temp 97.7°F | Resp 16 | Ht 62.0 in | Wt 180.9 lb

## 2018-02-13 DIAGNOSIS — D631 Anemia in chronic kidney disease: Secondary | ICD-10-CM

## 2018-02-13 DIAGNOSIS — N189 Chronic kidney disease, unspecified: Secondary | ICD-10-CM

## 2018-02-13 DIAGNOSIS — D638 Anemia in other chronic diseases classified elsewhere: Secondary | ICD-10-CM

## 2018-02-13 DIAGNOSIS — I129 Hypertensive chronic kidney disease with stage 1 through stage 4 chronic kidney disease, or unspecified chronic kidney disease: Secondary | ICD-10-CM

## 2018-02-13 DIAGNOSIS — N184 Chronic kidney disease, stage 4 (severe): Secondary | ICD-10-CM

## 2018-02-13 LAB — CBC WITH DIFFERENTIAL (CANCER CENTER ONLY)
BASOS PCT: 0 %
Basophils Absolute: 0 10*3/uL (ref 0.0–0.1)
EOS ABS: 0.2 10*3/uL (ref 0.0–0.5)
Eosinophils Relative: 5 %
HCT: 30.6 % — ABNORMAL LOW (ref 34.8–46.6)
HEMOGLOBIN: 9.3 g/dL — AB (ref 11.6–15.9)
LYMPHS ABS: 0.7 10*3/uL — AB (ref 0.9–3.3)
Lymphocytes Relative: 15 %
MCH: 26.6 pg (ref 25.1–34.0)
MCHC: 30.4 g/dL — ABNORMAL LOW (ref 31.5–36.0)
MCV: 87.7 fL (ref 79.5–101.0)
Monocytes Absolute: 0.4 10*3/uL (ref 0.1–0.9)
Monocytes Relative: 8 %
NEUTROS PCT: 72 %
Neutro Abs: 3.2 10*3/uL (ref 1.5–6.5)
Platelet Count: 171 10*3/uL (ref 145–400)
RBC: 3.49 MIL/uL — AB (ref 3.70–5.45)
RDW: 17.8 % — ABNORMAL HIGH (ref 11.2–14.5)
WBC: 4.4 10*3/uL (ref 3.9–10.3)

## 2018-02-13 LAB — CMP (CANCER CENTER ONLY)
ALT: 8 U/L (ref 0–44)
ANION GAP: 15 (ref 5–15)
AST: 10 U/L — ABNORMAL LOW (ref 15–41)
Albumin: 2.5 g/dL — ABNORMAL LOW (ref 3.5–5.0)
Alkaline Phosphatase: 66 U/L (ref 38–126)
BUN: 96 mg/dL — ABNORMAL HIGH (ref 8–23)
CHLORIDE: 108 mmol/L (ref 98–111)
CO2: 20 mmol/L — ABNORMAL LOW (ref 22–32)
CREATININE: 6.63 mg/dL — AB (ref 0.44–1.00)
Calcium: 8 mg/dL — ABNORMAL LOW (ref 8.9–10.3)
GFR, EST AFRICAN AMERICAN: 6 mL/min — AB (ref >60)
GFR, EST NON AFRICAN AMERICAN: 6 mL/min — AB (ref >60)
Glucose, Bld: 92 mg/dL (ref 70–99)
Potassium: 4.4 mmol/L (ref 3.5–5.1)
Sodium: 143 mmol/L (ref 135–145)
Total Bilirubin: 0.7 mg/dL (ref 0.3–1.2)
Total Protein: 5.9 g/dL — ABNORMAL LOW (ref 6.5–8.1)

## 2018-02-13 MED ORDER — DARBEPOETIN ALFA 300 MCG/0.6ML IJ SOSY
PREFILLED_SYRINGE | INTRAMUSCULAR | Status: AC
  Filled 2018-02-13: qty 0.6

## 2018-02-13 MED ORDER — DARBEPOETIN ALFA 300 MCG/0.6ML IJ SOSY
300.0000 ug | PREFILLED_SYRINGE | Freq: Once | INTRAMUSCULAR | Status: AC
  Administered 2018-02-13: 300 ug via SUBCUTANEOUS

## 2018-02-13 NOTE — Telephone Encounter (Signed)
Scheduled appt per 8/8 los - sent reminder letter in the mail with appt date and time - pt aware of appts .

## 2018-02-13 NOTE — Patient Instructions (Signed)

## 2018-02-13 NOTE — Progress Notes (Signed)
Patient instructed to be at [facility] admitting department at 10 am on 03/04/18 for surgery. NPO past MN night prior. Must have a driver and caregiver for discharge. Expect a call and follow the detailed instructions received from pre-admission testing department about this surgery.

## 2018-02-13 NOTE — Progress Notes (Signed)
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  PRIOR THERAPY: None  CURRENT THERAPY: Aranesp 300 g subcutaneously every 3 weeks. First dose 03/06/2017. Over-the-counter ferrous sulfate.  INTERVAL HISTORY: Norma [name] 72 y.o. female returns to the clinic today for follow-up visit.  The patient is feeling fine today with no specific complaints.  She was seen recently by her nephrologist and was referred to vascular surgery for consideration of AV fistula in preparation for hemodialysis.  She was seen by Dr. Oneida Alar.  The patient is feeling fine today with no specific complaints except for mild fatigue.  She did not receive Aranesp injections 3 weeks ago because of hemoglobin was 11.0.  She is here today for evaluation with repeat CBC, iron study and ferritin.  She has no nausea, vomiting, diarrhea or constipation.  She denied having any weight loss or night sweats.  She has no chest pain, shortness breath, cough or hemoptysis.  MEDICAL HISTORY: Past Medical History:  Diagnosis Date  . Anxiety   . Carotid artery occlusion   . CKD (chronic kidney disease) stage 3, GFR 30-59 ml/min (HCC) 04/18/2014  . Claudication ([place])   . Constipation    Due to patient taking iron supplements  . Coronary artery disease   . DDD (degenerative disc disease)   . Diabetes mellitus   . Diabetic coma ([place])   . DJD (degenerative joint disease)   . DJD (degenerative joint disease)   . Edema    Bilateral lower extermities  . Gout   . Hyperlipidemia   . Hypertension   . Iron deficiency anemia   . Leg pain   . Obesity   . PAD (peripheral artery disease) ([place])   . Shortness of breath    exertion    ALLERGIES:  is allergic to sulfasalazine; iohexol; and sulfa antibiotics.  MEDICATIONS:    Current Outpatient Medications  Medication Sig Dispense Refill  . acetaminophen (TYLENOL) 325 MG tablet Take 650 mg by mouth every 6 (six) hours as needed for moderate pain or headache.     . allopurinol (ZYLOPRIM) 100 MG tablet Take 100 mg by mouth daily as needed (for gout).     Marland Kitchen aspirin EC 81 MG tablet Take 81 mg by mouth daily.    . calcitRIOL (ROCALTROL) 0.25 MCG capsule Take 0.25 mcg by mouth daily.    . carvedilol (COREG) 12.5 MG tablet Take 1 tablet (12.5 mg total) by mouth 2 (two) times daily. 180 tablet 3  . cholecalciferol (VITAMIN D) 1000 units tablet Take 1,000 Units by mouth daily.     . cloNIDine (CATAPRES) 0.3 MG tablet Take 0.3 mg by mouth 2 (two) times daily.     . Coenzyme Q10 200 MG capsule Take 200 mg by mouth every morning.     . colesevelam (WELCHOL) 625 MG tablet Take 1,875 mg by mouth 2 (two) times daily with a meal.     . ferrous sulfate 325 (65 FE) MG tablet Take 325 mg by mouth daily with breakfast.    . furosemide (LASIX) 80 MG tablet Take 160 mg by mouth daily.     . hydrALAZINE (APRESOLINE) 50 MG tablet Take 50 mg by mouth 3 (three) times daily.    Marland Kitchen HYDROcodone-acetaminophen (NORCO/VICODIN) 5-325 MG tablet Take 1 tablet by mouth  every 6 (six) hours as needed for moderate pain. (Patient taking differently: Take 1 tablet by mouth daily as needed for severe pain. ) 60 tablet 0  . insulin glargine (LANTUS) 100 UNIT/ML injection Inject 0.2 mLs (20 Units total) into the skin 2 (two) times daily. (Patient taking differently: Inject 20-24 Units into the skin See admin instructions. Inject 24 units SQ in the morning and inject 20 units SQ in the evening) 10 mL 11  . isosorbide dinitrate (ISORDIL) 20 MG tablet Take 40 mg by mouth 2 (two) times daily.     Marland Kitchen liraglutide (VICTOZA) 18 MG/3ML SOPN Inject 1.8 mg into the skin daily.     . Probiotic Product (PROBIOTIC DAILY PO) Take 1 tablet by mouth daily.     . sodium bicarbonate 650 MG tablet Take 650 mg by mouth 2 (two) times  daily.  3  . triamcinolone ointment (KENALOG) 0.5 % Apply 1 application topically daily as needed (for rash on leg).     . vitamin B-12 (CYANOCOBALAMIN) 500 MCG tablet Take 500 mcg by mouth every morning.      No current facility-administered medications for this visit.     SURGICAL HISTORY:  Past Surgical History:  Procedure Laterality Date  . AORTA - BILATERAL FEMORAL ARTERY BYPASS GRAFT  05/25/2011   Procedure: AORTA BIFEMORAL BYPASS GRAFT;  Surgeon: Mal Misty, MD;  Location: [place];  Service: Vascular;  Laterality: N/A;  . APPLICATION OF WOUND VAC Right 04/23/2016   Procedure: APPLICATION OF WOUND VAC CHANGED;  Surgeon: Meredith Pel, MD;  Location: [place];  Service: Orthopedics;  Laterality: Right;  . BREAST EXCISIONAL BIOPSY Right   . BREAST SURGERY    . CARDIAC CATHETERIZATION  12/01/2009   EF 65%  . CARDIOVASCULAR STRESS TEST  11/28/2009   EF 75%  . COLONOSCOPY W/ POLYPECTOMY    . CORONARY ARTERY BYPASS GRAFT  12/08/2009   LIMA GRAFT TO THE DISTAL LAD AND SAPHENOUS VEIN GRAFT TO THE OBTUSE MARGINAL VESSEL  . EYE SURGERY    . I&D EXTREMITY Right 04/23/2016   Procedure: IRRIGATION AND DEBRIDEMENT EXTREMITY;  Surgeon: Meredith Pel, MD;  Location: [place];  Service: Orthopedics;  Laterality: Right;  . INCISION AND DRAINAGE HIP Right 04/20/2016   Procedure: IRRIGATION AND DEBRIDEMENT HIP WITH POLY EXCHANGE, ABX BEAD PLACEMENT, WOUND VAC ;  Surgeon: Meredith Pel, MD;  Location: [place];  Service: Orthopedics;  Laterality: Right;  RIGHT HIP SUPERFICIAL I&D, POSSIBLE DEEP I&D, LINER EXCHANGE, ABX BEAD PLACEMENT, WOUND VAC.   Marland Kitchen PR VEIN BYPASS GRAFT,AORTO-FEM-POP  05/25/11  . REMOVAL OF FIBROUS CYST FROM RIGHT BREAST  10+ YEARS  . RETINAL DETACHMENT SURGERY  10+ YEARS   LEFT EYE  . TOTAL HIP ARTHROPLASTY Right 03/06/2016  . TOTAL HIP ARTHROPLASTY Right 03/06/2016   Procedure: RIGHT TOTAL HIP ARTHROPLASTY ANTERIOR APPROACH;  Surgeon: Meredith Pel, MD;  Location: [place];  Service: Orthopedics;  Laterality: Right;  . TRANSTHORACIC ECHOCARDIOGRAM  12/01/2009   EF 60-65%    REVIEW OF SYSTEMS:  A comprehensive review of systems was negative except for: Constitutional: positive for fatigue   PHYSICAL EXAMINATION: General appearance: alert, cooperative, fatigued and no distress Head: Normocephalic, without obvious abnormality, atraumatic Neck: no adenopathy, no JVD, supple, symmetrical, trachea midline and thyroid not enlarged, symmetric, no tenderness/mass/nodules Lymph nodes: Cervical, supraclavicular, and axillary nodes normal. Resp: clear to auscultation bilaterally Back: symmetric, no curvature. ROM normal. No CVA tenderness. Cardio: regular rate and rhythm, S1, S2  normal, no murmur, click, rub or gallop GI: soft, non-tender; bowel sounds normal; no masses,  no organomegaly Extremities: extremities normal, atraumatic, no cyanosis or edema  ECOG PERFORMANCE STATUS: 1 - Symptomatic but completely ambulatory  Blood pressure (!) 145/45, pulse (!) 47, temperature 97.7 F (36.5 C), temperature source Oral, resp. rate 16, height 5\' 2"  (1.575 m), weight 180 lb 14.4 oz (82.1 kg), SpO2 99 %.  LABORATORY DATA: Lab Results  Component Value Date   WBC 4.4 02/13/2018   HGB 9.3 (L) 02/13/2018   HCT 30.6 (L) 02/13/2018   MCV 87.7 02/13/2018   PLT 171 02/13/2018      Chemistry      Component Value Date/Time   NA 143 02/13/2018 1035   NA 143 06/28/2017 1239   K 4.4 02/13/2018 1035   K 4.8 06/28/2017 1239   CL 108 02/13/2018 1035   CO2 20 (L) 02/13/2018 1035   CO2 22 06/28/2017 1239   BUN 96 (H) 02/13/2018 1035   BUN 48.6 (H) 06/28/2017 1239   CREATININE 6.63 (HH) 02/13/2018 1035   CREATININE 2.9 (H) 06/28/2017 1239      Component Value Date/Time   CALCIUM 8.0 (L) 02/13/2018 1035   CALCIUM 8.3 (L) 06/28/2017 1239   ALKPHOS 66 02/13/2018 1035   ALKPHOS 86 06/28/2017 1239   AST 10 (L) 02/13/2018 1035   AST 11 06/28/2017 1239   ALT 8 02/13/2018 1035    ALT <6 06/28/2017 1239   BILITOT 0.7 02/13/2018 1035   BILITOT 0.28 06/28/2017 1239       RADIOGRAPHIC STUDIES: No results found.  ASSESSMENT AND PLAN:  This is a very pleasant 72 years old African-American female with anemia of chronic disease secondary to renal insufficiency plus/minus iron deficiency. The patient is currently on treatment with Aranesp 300 mcg every 3 weeks in addition to over-the-counter oral iron tablets.  The patient continues to tolerate this treatment well.  It has maintained her hemoglobin around 10. 0 G/DL. I recommended for the patient to continue her current treatment with Aranesp and oral iron tablets for now. I will see her back for follow-up visit in 3 months for evaluation after repeating CBC, iron study and ferritin. For the chronic renal insufficiency she is followed by nephrology and expected to start hemodialysis soon. She was advised to call immediately if she has any concerning symptoms in the interval. The patient voices understanding of current disease status and treatment options and is in agreement with the current care plan. All questions were answered. The patient knows to call the clinic with any problems, questions or concerns. We can certainly see the patient much sooner if necessary.  Disclaimer: This note was dictated with voice recognition software. Similar sounding words can inadvertently be transcribed and may not be corrected upon review.

## 2018-02-17 ENCOUNTER — Telehealth: Payer: Self-pay | Admitting: *Deleted

## 2018-02-17 NOTE — Telephone Encounter (Signed)
Patient called and instructed to be at [facility] admitting department at 11 am on 03/03/18. All other instructions unchanged .

## 2018-02-28 ENCOUNTER — Encounter (HOSPITAL_COMMUNITY): Payer: Self-pay | Admitting: *Deleted

## 2018-02-28 ENCOUNTER — Other Ambulatory Visit: Payer: Self-pay

## 2018-02-28 NOTE — Progress Notes (Signed)
Anesthesia Chart Review:  Case:  [id number] Date/Time:  03/03/18 1213   Procedure:  BRACHIOCEPHALIC ARTERIOVENOUS (AV) FISTULA CREATION LEFT ARM (Left )   Anesthesia type:  Monitor Anesthesia Care   Pre-op diagnosis:  CHRONIC KIDNEY DISEASE FOR HEMODIALYSIS ACCESS   Location:  [facility] OR ROOM 50 / [facility]   Surgeon:  Elam Dutch, MD      DISCUSSION: 72 yo female former smoker for above procedure. 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   Pt underwent Right THA 03/06/2016 with 2 subsequent surgeries due to infection on 04/20/16 and 04/24/2016. All procedures under general anesthesia and no complications noted. Notes in Epic.  Anticipate she can proceed as planned barring acute status change and DOS labs acceptable.  VS: There were no vitals taken for this visit.  PROVIDERS: Bartholome Bill, MD is PCP  Erling Cruz, MD is Nephrologist  Martinique, Peter, MD is Cardiologist last seen 09/05/2017, advised 83yr f/u.  LABS: Most recent labs displayed. Will need DOS istat4.   Ref. Range 02/13/2018 10:35  Sodium Latest Ref Range: 135 - 145 mmol/L 143  Potassium Latest Ref Range: 3.5 - 5.1 mmol/L 4.4  Chloride Latest Ref Range: 98 - 111 mmol/L 108  CO2 Latest Ref Range: 22 - 32 mmol/L 20 (L)  Glucose Latest Ref Range: 70 - 99 mg/dL 92  BUN Latest Ref Range: 8 - 23 mg/dL 96 (H)  Creatinine Latest Ref Range: 0.44 - 1.00 mg/dL 6.63 (HH)  Calcium Latest Ref Range: 8.9 - 10.3 mg/dL 8.0 (L)  Anion gap Latest Ref Range: 5 - 15  15  Alkaline Phosphatase Latest Ref Range: 38 - 126 U/L 66  Albumin Latest Ref Range: 3.5 - 5.0 g/dL 2.5 (L)  AST Latest Ref Range: 15 - 41 U/L 10 (L)  ALT Latest Ref Range: 0 - 44 U/L 8  Total Protein Latest Ref Range: 6.5 - 8.1 g/dL 5.9 (L)  Total Bilirubin Latest Ref Range: 0.3 - 1.2 mg/dL 0.7  GFR, Est Non African American Latest Ref Range:  >60 mL/min 6 (L)  GFR, Est African American Latest Ref Range: >60 mL/min 6 (L)  WBC Latest Ref Range: 3.9 - 10.3 K/uL 4.4  RBC Latest Ref Range: 3.70 - 5.45 MIL/uL 3.49 (L)  Hemoglobin Latest Ref Range: 11.6 - 15.9 g/dL 9.3 (L)  HCT Latest Ref Range: 34.8 - 46.6 % 30.6 (L)  MCV Latest Ref Range: 79.5 - 101.0 fL 87.7  MCH Latest Ref Range: 25.1 - 34.0 pg 26.6  MCHC Latest Ref Range: 31.5 - 36.0 g/dL 30.4 (L)  RDW Latest Ref Range: 11.2 - 14.5 % 17.8 (H)  Platelets Latest Ref Range: 145 - 400 K/uL 171  Neutrophils Latest Units: % 72  Lymphocytes Latest Units: % 15  Monocytes Relative Latest Units: % 8  Eosinophil Latest Units: % 5  Basophil Latest Units: % 0  NEUT# Latest Ref Range: 1.5 - 6.5 K/uL 3.2  Lymphocyte # Latest Ref Range: 0.9 - 3.3 K/uL 0.7 (L)  Monocyte # Latest Ref Range: 0.1 - 0.9 K/uL 0.4  Eosinophils Absolute Latest Ref Range: 0.0 - 0.5 K/uL 0.2  Basophils Absolute Latest Ref Range: 0.0 - 0.1 K/uL 0.0      IMAGES: EXAM: CHEST  2 VIEW 02/27/2016  COMPARISON:  Chest x-ray dated 03/02/2014.  FINDINGS: Mild cardiomegaly is stable. Median sternotomy wires appear intact and  stable in alignment. Atherosclerotic changes again noted at the aortic arch. Lungs are clear. Slight elevation of the right hemidiaphragm is stable.  IMPRESSION: No active cardiopulmonary disease. Stable mild cardiomegaly. Aortic atherosclerosis.  CT Aorta 04/27/2014: IMPRESSION: 1. Bovine aortic arch. Branch vessels arising from the aortic arch are patent without stenosis. The proximal portions of bilateral carotid arteries and subclavian arteries are patent.  EKG: 09/05/2017: Sinus brady (49bpm), nonspecific t abnromality   CV: Cardiac monitor 10/13/15:   Normal sinus rhythm/ sinus bradycardia.  Rare PACs  No other arrhythmia  symptoms of fluttering/skipped beats correlates with PACs  symptoms of rapid heart beat/racing do not correlate with any arrhythmia.  Carotid duplex  03/01/14: B proximal ICAs demonstrate mild amount of fibrous plaque with no evidence of hemodynamically significant stenosis.   Carotid duplex 03/01/14:  B proximal ICAs demonstrate mild amount of fibrous plaque with no evidence of hemodynamically significant stenosis.   Echo 09/04/12:  - Left ventricle: The cavity size was normal. Wall thickness was increased in a pattern of severe LVH. Systolicfunction was normal. The estimated ejection fraction wasin the range of 60% to 65%. Wall motion was normal; there were no regional wall motion abnormalities. Dopplerparameters are consistent with abnormal left ventricularrelaxation (grade 1 diastolic dysfunction). Dopplerparameters are consistent with high ventricular filling pressure. - Mitral valve: Calcified annulus. - Left atrium: The atrium was moderately dilated. - Pulmonary arteries: Systolic pressure was moderatelyincreased. PA peak pressure: 68mm Hg (S). - Pericardium, extracardiac: A trivial pericardial effusion was identified. Past Medical History:  Diagnosis Date  . Anxiety   . Carotid artery occlusion   . CKD (chronic kidney disease) stage 3, GFR 30-59 ml/min (HCC) 04/18/2014  . Claudication ([place])   . Constipation    Due to patient taking iron supplements  . Coronary artery disease   . DDD (degenerative disc disease)   . Diabetes mellitus   . Diabetic coma ([place])   . DJD (degenerative joint disease)   . DJD (degenerative joint disease)   . Edema    Bilateral lower extermities  . Gout   . Hyperlipidemia   . Hypertension   . Iron deficiency anemia   . Leg pain   . Obesity   . PAD (peripheral artery disease) ([place])   . Shortness of breath    exertion    Past Surgical History:  Procedure Laterality Date  . AORTA - BILATERAL FEMORAL ARTERY BYPASS GRAFT  05/25/2011   Procedure: AORTA BIFEMORAL BYPASS GRAFT;  Surgeon: Mal Misty, MD;  Location: [facility];  Service: Vascular;  Laterality: N/A;  . APPLICATION OF WOUND VAC  Right 04/23/2016   Procedure: APPLICATION OF WOUND VAC CHANGED;  Surgeon: Meredith Pel, MD;  Location: [facility];  Service: Orthopedics;  Laterality: Right;  . BREAST EXCISIONAL BIOPSY Right   . BREAST SURGERY    . CARDIAC CATHETERIZATION  12/01/2009   EF 65%  . CARDIOVASCULAR STRESS TEST  11/28/2009   EF 75%  . COLONOSCOPY W/ POLYPECTOMY    . CORONARY ARTERY BYPASS GRAFT  12/08/2009   LIMA GRAFT TO THE DISTAL LAD AND SAPHENOUS VEIN GRAFT TO THE OBTUSE MARGINAL VESSEL  . EYE SURGERY    . I&D EXTREMITY Right 04/23/2016   Procedure: IRRIGATION AND DEBRIDEMENT EXTREMITY;  Surgeon: Meredith Pel, MD;  Location: [facility];  Service: Orthopedics;  Laterality: Right;  . INCISION AND DRAINAGE HIP Right 04/20/2016   Procedure: IRRIGATION AND DEBRIDEMENT HIP WITH POLY EXCHANGE, ABX BEAD PLACEMENT, WOUND VAC ;  Surgeon:  Meredith Pel, MD;  Location: [facility];  Service: Orthopedics;  Laterality: Right;  RIGHT HIP SUPERFICIAL I&D, POSSIBLE DEEP I&D, LINER EXCHANGE, ABX BEAD PLACEMENT, WOUND VAC.   Marland Kitchen PR VEIN BYPASS GRAFT,AORTO-FEM-POP  05/25/11  . REMOVAL OF FIBROUS CYST FROM RIGHT BREAST  10+ YEARS  . RETINAL DETACHMENT SURGERY  10+ YEARS   LEFT EYE  . TOTAL HIP ARTHROPLASTY Right 03/06/2016  . TOTAL HIP ARTHROPLASTY Right 03/06/2016   Procedure: RIGHT TOTAL HIP ARTHROPLASTY ANTERIOR APPROACH;  Surgeon: Meredith Pel, MD;  Location: [facility];  Service: Orthopedics;  Laterality: Right;  . TRANSTHORACIC ECHOCARDIOGRAM  12/01/2009   EF 60-65%    MEDICATIONS: No current facility-administered medications for this encounter.    Marland Kitchen acetaminophen (TYLENOL) 500 MG tablet  . allopurinol (ZYLOPRIM) 100 MG tablet  . aspirin EC 81 MG tablet  . calcitRIOL (ROCALTROL) 0.25 MCG capsule  . carvedilol (COREG) 12.5 MG tablet  . cloNIDine (CATAPRES) 0.3 MG tablet  . Coenzyme Q10 300 MG CAPS  . colesevelam (WELCHOL) 625 MG tablet  . ferrous sulfate 325 (65 FE) MG tablet  . furosemide (LASIX) 80 MG tablet  .  hydrALAZINE (APRESOLINE) 50 MG tablet  . HYDROcodone-acetaminophen (NORCO/VICODIN) 5-325 MG tablet  . insulin glargine (LANTUS) 100 UNIT/ML injection  . isosorbide dinitrate (ISORDIL) 20 MG tablet  . liraglutide (VICTOZA) 18 MG/3ML SOPN  . Multiple Vitamins-Minerals (MULTIVITAMIN PO)  . Sennosides 25 MG TABS     Wynonia Musty [facility] Short Stay Center/Anesthesiology Phone [phone] 02/28/2018 11:08 AM

## 2018-02-28 NOTE — Progress Notes (Signed)
Pt denies SOB and chest pain. Pt under the care of Dr. Peter Martinique, Cardiology. Pt denies having a cardiac cath but stated that a stress test was performed by Dr. Martinique; records requested. Pt denies having a chest x ray within the last year. Pt denies having an A1c within the last 60 days. Pt made aware to stop taking vitamins, fish oil, CO Q 10 and herbal medications. Do not take any NSAIDs ie: Ibuprofen, Advil, Naproxen (Aleve), Motrin, BC and Goody Powder. Pt made aware to take 10 units of Lantus the night before surgery and 12 units DOS if blood glucose (BG) > 70. Pt made aware to hold Victoza on DOS. Pt made aware to check BG every 2 hours prior to arrival to hospital on DOS. Pt made aware to treat a BG < 70 with 4 ounces of  cranberry juice, wait 15 minutes after intervention to recheck BG, if BG remains < 70, call Short Stay unit to speak with a nurse. Pt verbalized understanding of all pre-op instructions. Anesthesia asked to review pt cardiac history (see note).

## 2018-03-03 ENCOUNTER — Ambulatory Visit (HOSPITAL_COMMUNITY): Payer: Medicare Other | Admitting: Physician Assistant

## 2018-03-03 ENCOUNTER — Encounter (HOSPITAL_COMMUNITY)
Admission: RE | Disposition: A | Discharge status: Home / Self Care | Payer: Self-pay | Source: Ambulatory Visit | Attending: Vascular Surgery

## 2018-03-03 ENCOUNTER — Ambulatory Visit (HOSPITAL_COMMUNITY)
Admission: RE | Admit: 2018-03-03 | Discharge: 2018-03-03 | Disposition: A | Discharge status: Home / Self Care | Payer: Medicare Other | Source: Ambulatory Visit | Attending: Vascular Surgery | Admitting: Vascular Surgery

## 2018-03-03 ENCOUNTER — Encounter (HOSPITAL_COMMUNITY): Payer: Self-pay | Admitting: Certified Registered"

## 2018-03-03 DIAGNOSIS — E669 Obesity, unspecified: Secondary | ICD-10-CM | POA: Diagnosis not present

## 2018-03-03 DIAGNOSIS — I2510 Atherosclerotic heart disease of native coronary artery without angina pectoris: Secondary | ICD-10-CM | POA: Diagnosis not present

## 2018-03-03 DIAGNOSIS — Z951 Presence of aortocoronary bypass graft: Secondary | ICD-10-CM | POA: Diagnosis not present

## 2018-03-03 DIAGNOSIS — Z882 Allergy status to sulfonamides status: Secondary | ICD-10-CM | POA: Diagnosis not present

## 2018-03-03 DIAGNOSIS — Z841 Family history of disorders of kidney and ureter: Secondary | ICD-10-CM | POA: Insufficient documentation

## 2018-03-03 DIAGNOSIS — I6529 Occlusion and stenosis of unspecified carotid artery: Secondary | ICD-10-CM | POA: Insufficient documentation

## 2018-03-03 DIAGNOSIS — Z794 Long term (current) use of insulin: Secondary | ICD-10-CM | POA: Diagnosis not present

## 2018-03-03 DIAGNOSIS — N186 End stage renal disease: Secondary | ICD-10-CM | POA: Diagnosis present

## 2018-03-03 DIAGNOSIS — Z8249 Family history of ischemic heart disease and other diseases of the circulatory system: Secondary | ICD-10-CM | POA: Insufficient documentation

## 2018-03-03 DIAGNOSIS — D509 Iron deficiency anemia, unspecified: Secondary | ICD-10-CM | POA: Diagnosis not present

## 2018-03-03 DIAGNOSIS — Z888 Allergy status to other drugs, medicaments and biological substances status: Secondary | ICD-10-CM | POA: Diagnosis not present

## 2018-03-03 DIAGNOSIS — E785 Hyperlipidemia, unspecified: Secondary | ICD-10-CM | POA: Diagnosis not present

## 2018-03-03 DIAGNOSIS — M109 Gout, unspecified: Secondary | ICD-10-CM | POA: Insufficient documentation

## 2018-03-03 DIAGNOSIS — Z96641 Presence of right artificial hip joint: Secondary | ICD-10-CM | POA: Insufficient documentation

## 2018-03-03 DIAGNOSIS — Z7982 Long term (current) use of aspirin: Secondary | ICD-10-CM | POA: Insufficient documentation

## 2018-03-03 DIAGNOSIS — I120 Hypertensive chronic kidney disease with stage 5 chronic kidney disease or end stage renal disease: Secondary | ICD-10-CM | POA: Insufficient documentation

## 2018-03-03 DIAGNOSIS — Z79899 Other long term (current) drug therapy: Secondary | ICD-10-CM | POA: Diagnosis not present

## 2018-03-03 DIAGNOSIS — Z87891 Personal history of nicotine dependence: Secondary | ICD-10-CM | POA: Diagnosis not present

## 2018-03-03 DIAGNOSIS — Z9889 Other specified postprocedural states: Secondary | ICD-10-CM | POA: Diagnosis not present

## 2018-03-03 DIAGNOSIS — E1122 Type 2 diabetes mellitus with diabetic chronic kidney disease: Secondary | ICD-10-CM | POA: Diagnosis not present

## 2018-03-03 DIAGNOSIS — N185 Chronic kidney disease, stage 5: Secondary | ICD-10-CM

## 2018-03-03 HISTORY — DX: Inflammatory liver disease, unspecified: K75.9

## 2018-03-03 HISTORY — PX: AV FISTULA PLACEMENT: SHX1204

## 2018-03-03 LAB — POCT I-STAT 4, (NA,K, GLUC, HGB,HCT)
Glucose, Bld: 138 mg/dL — ABNORMAL HIGH (ref 70–99)
HCT: 30 % — ABNORMAL LOW (ref 36.0–46.0)
Hemoglobin: 10.2 g/dL — ABNORMAL LOW (ref 12.0–15.0)
POTASSIUM: 4.6 mmol/L (ref 3.5–5.1)
SODIUM: 144 mmol/L (ref 135–145)

## 2018-03-03 LAB — GLUCOSE, CAPILLARY
GLUCOSE-CAPILLARY: 106 mg/dL — AB (ref 70–99)
Glucose-Capillary: 146 mg/dL — ABNORMAL HIGH (ref 70–99)

## 2018-03-03 LAB — HEMOGLOBIN A1C
HEMOGLOBIN A1C: 6.3 % — AB (ref 4.8–5.6)
Mean Plasma Glucose: 134.11 mg/dL

## 2018-03-03 SURGERY — ARTERIOVENOUS (AV) FISTULA CREATION
Anesthesia: Monitor Anesthesia Care | Site: Arm Upper | Laterality: Left

## 2018-03-03 MED ORDER — SODIUM CHLORIDE 0.9 % IV SOLN
INTRAVENOUS | Status: AC
  Filled 2018-03-03: qty 1.2

## 2018-03-03 MED ORDER — ONDANSETRON HCL 4 MG/2ML IJ SOLN: 4.0000 mg | Freq: Once | INTRAMUSCULAR | Status: DC | PRN

## 2018-03-03 MED ORDER — EPHEDRINE 5 MG/ML INJ
INTRAVENOUS | Status: AC
  Filled 2018-03-03: qty 10

## 2018-03-03 MED ORDER — DEXAMETHASONE SODIUM PHOSPHATE 10 MG/ML IJ SOLN
INTRAMUSCULAR | Status: DC | PRN
  Administered 2018-03-03: 5 mg via INTRAVENOUS

## 2018-03-03 MED ORDER — FENTANYL CITRATE (PF) 100 MCG/2ML IJ SOLN: 25.0000 ug | INTRAMUSCULAR | Status: DC | PRN

## 2018-03-03 MED ORDER — HYDROCODONE-ACETAMINOPHEN 5-325 MG PO TABS: 1.0000 | ORAL_TABLET | Freq: Four times a day (QID) | ORAL | 0 refills | Status: DC | PRN

## 2018-03-03 MED ORDER — ONDANSETRON HCL 4 MG/2ML IJ SOLN
INTRAMUSCULAR | Status: DC | PRN
  Administered 2018-03-03: 4 mg via INTRAVENOUS

## 2018-03-03 MED ORDER — HEPARIN SODIUM (PORCINE) 1000 UNIT/ML IJ SOLN
INTRAMUSCULAR | Status: DC | PRN
  Administered 2018-03-03: 3000 [IU] via INTRAVENOUS

## 2018-03-03 MED ORDER — SODIUM CHLORIDE 0.9 % IV SOLN
INTRAVENOUS | Status: DC | PRN
  Administered 2018-03-03: 30 ug/min via INTRAVENOUS

## 2018-03-03 MED ORDER — CHLORHEXIDINE GLUCONATE 4 % EX LIQD: 60.0000 mL | Freq: Once | CUTANEOUS | Status: DC

## 2018-03-03 MED ORDER — SODIUM CHLORIDE 0.9 % IV SOLN
INTRAVENOUS | Status: DC
  Administered 2018-03-03: 10:00:00 via INTRAVENOUS

## 2018-03-03 MED ORDER — LIDOCAINE 2% (20 MG/ML) 5 ML SYRINGE
INTRAMUSCULAR | Status: AC
  Filled 2018-03-03: qty 5

## 2018-03-03 MED ORDER — ONDANSETRON HCL 4 MG/2ML IJ SOLN
INTRAMUSCULAR | Status: AC
  Filled 2018-03-03: qty 2

## 2018-03-03 MED ORDER — LIDOCAINE HCL (PF) 1 % IJ SOLN
INTRAMUSCULAR | Status: AC
  Filled 2018-03-03: qty 30

## 2018-03-03 MED ORDER — FENTANYL CITRATE (PF) 250 MCG/5ML IJ SOLN
INTRAMUSCULAR | Status: AC
  Filled 2018-03-03: qty 5

## 2018-03-03 MED ORDER — OXYCODONE HCL 5 MG PO TABS: 5.0000 mg | ORAL_TABLET | Freq: Once | ORAL | Status: DC | PRN

## 2018-03-03 MED ORDER — DEXAMETHASONE SODIUM PHOSPHATE 10 MG/ML IJ SOLN
INTRAMUSCULAR | Status: AC
  Filled 2018-03-03: qty 1

## 2018-03-03 MED ORDER — CEFAZOLIN SODIUM-DEXTROSE 2-4 GM/100ML-% IV SOLN
2.0000 g | INTRAVENOUS | Status: AC
  Administered 2018-03-03: 2 g via INTRAVENOUS

## 2018-03-03 MED ORDER — EPHEDRINE SULFATE-NACL 50-0.9 MG/10ML-% IV SOSY
PREFILLED_SYRINGE | INTRAVENOUS | Status: DC | PRN
  Administered 2018-03-03: 10 mg via INTRAVENOUS

## 2018-03-03 MED ORDER — FENTANYL CITRATE (PF) 100 MCG/2ML IJ SOLN
INTRAMUSCULAR | Status: DC | PRN
  Administered 2018-03-03 (×2): 50 ug via INTRAVENOUS
  Administered 2018-03-03: 25 ug via INTRAVENOUS

## 2018-03-03 MED ORDER — OXYCODONE HCL 5 MG/5ML PO SOLN: 5.0000 mg | Freq: Once | ORAL | Status: DC | PRN

## 2018-03-03 MED ORDER — 0.9 % SODIUM CHLORIDE (POUR BTL) OPTIME
TOPICAL | Status: DC | PRN
  Administered 2018-03-03: 1000 mL

## 2018-03-03 MED ORDER — PROPOFOL 500 MG/50ML IV EMUL
INTRAVENOUS | Status: DC | PRN
  Administered 2018-03-03: 100 ug/kg/min via INTRAVENOUS

## 2018-03-03 MED ORDER — PROPOFOL 10 MG/ML IV BOLUS
INTRAVENOUS | Status: AC
  Filled 2018-03-03: qty 20

## 2018-03-03 MED ORDER — SODIUM CHLORIDE 0.9 % IV SOLN
INTRAVENOUS | Status: DC | PRN
  Administered 2018-03-03: 500 mL

## 2018-03-03 MED ORDER — CEFAZOLIN SODIUM-DEXTROSE 2-4 GM/100ML-% IV SOLN
INTRAVENOUS | Status: AC
  Filled 2018-03-03: qty 100

## 2018-03-03 MED ORDER — LIDOCAINE HCL (PF) 1 % IJ SOLN
INTRAMUSCULAR | Status: DC | PRN
  Administered 2018-03-03: 8 mL

## 2018-03-03 SURGICAL SUPPLY — 36 items
ARMBAND PINK RESTRICT EXTREMIT (MISCELLANEOUS) ×4 IMPLANT
CANISTER SUCT 3000ML PPV (MISCELLANEOUS) ×2 IMPLANT
CANNULA VESSEL 3MM 2 BLNT TIP (CANNULA) ×2 IMPLANT
CLIP VESOCCLUDE MED 6/CT (CLIP) ×2 IMPLANT
CLIP VESOCCLUDE SM WIDE 6/CT (CLIP) ×2 IMPLANT
COVER PROBE W GEL 5X96 (DRAPES) IMPLANT
DECANTER SPIKE VIAL GLASS SM (MISCELLANEOUS) ×2 IMPLANT
DERMABOND ADVANCED (GAUZE/BANDAGES/DRESSINGS) ×1
DERMABOND ADVANCED .7 DNX12 (GAUZE/BANDAGES/DRESSINGS) ×1 IMPLANT
DRAIN PENROSE 1/4X12 LTX STRL (WOUND CARE) ×2 IMPLANT
ELECT REM PT RETURN 9FT ADLT (ELECTROSURGICAL) ×2
ELECTRODE REM PT RTRN 9FT ADLT (ELECTROSURGICAL) ×1 IMPLANT
GLOVE BIO SURGEON STRL SZ7.5 (GLOVE) ×2 IMPLANT
GLOVE BIOGEL PI IND STRL 6.5 (GLOVE) ×1 IMPLANT
GLOVE BIOGEL PI IND STRL 7.0 (GLOVE) ×2 IMPLANT
GLOVE BIOGEL PI IND STRL 7.5 (GLOVE) ×1 IMPLANT
GLOVE BIOGEL PI INDICATOR 6.5 (GLOVE) ×1
GLOVE BIOGEL PI INDICATOR 7.0 (GLOVE) ×2
GLOVE BIOGEL PI INDICATOR 7.5 (GLOVE) ×1
GOWN STRL REUS W/ TWL LRG LVL3 (GOWN DISPOSABLE) ×3 IMPLANT
GOWN STRL REUS W/TWL LRG LVL3 (GOWN DISPOSABLE) ×3
KIT BASIN OR (CUSTOM PROCEDURE TRAY) ×2 IMPLANT
KIT TURNOVER KIT B (KITS) ×2 IMPLANT
LOOP VESSEL MINI RED (MISCELLANEOUS) IMPLANT
NS IRRIG 1000ML POUR BTL (IV SOLUTION) ×2 IMPLANT
PACK CV ACCESS (CUSTOM PROCEDURE TRAY) ×2 IMPLANT
PAD ARMBOARD 7.5X6 YLW CONV (MISCELLANEOUS) ×4 IMPLANT
SPONGE SURGIFOAM ABS GEL 100 (HEMOSTASIS) IMPLANT
SUT PROLENE 6 0 BV (SUTURE) ×2 IMPLANT
SUT PROLENE 7 0 BV 1 (SUTURE) ×6 IMPLANT
SUT VIC AB 3-0 SH 27 (SUTURE) ×1
SUT VIC AB 3-0 SH 27X BRD (SUTURE) ×1 IMPLANT
SUT VICRYL 4-0 PS2 18IN ABS (SUTURE) ×2 IMPLANT
TOWEL GREEN STERILE (TOWEL DISPOSABLE) ×2 IMPLANT
UNDERPAD 30X30 (UNDERPADS AND DIAPERS) ×2 IMPLANT
WATER STERILE IRR 1000ML POUR (IV SOLUTION) ×2 IMPLANT

## 2018-03-03 NOTE — Op Note (Signed)
Procedure: Left Brachial Cephalic AV fistula  Preop: ESRD  Postop: ESRD  Anesthesia: Local with IV sedation  Assistant: Gerri Lins PA-C  Findings: 4 mm cephalic vein  Procedure: After obtaining informed consent, the patient was taken to the operating room.  After adequate sedation, the left upper extremity was prepped and draped in usual sterile fashion.  Local anesthesia was infiltrated near the antecubital crease.  A transverse incision was then made near the antecubital crease the left arm. The incision was carried into the subcutaneous tissues down to level of the cephalic vein. The cephalic vein was approximately 4 mm in diameter. It was of good quality. This was dissected free circumferentially and small side branches ligated and divided between silk ties or clips. Next the brachial artery was dissected free in the medial portion of the incision. The artery was  3-4 mm in diameter. The vessel loops were placed proximal and distal to the planned site of arteriotomy. The patient was given 3000 units of intravenous heparin. After appropriate circulation time, the vessel loops were used to control the artery. A longitudinal opening was made in the brachial artery.  The vein was ligated distally with a 2-0 silk tie. The vein was controlled proximally with a fine bulldog clamp. The vein was then swung over to the artery and sewn end of vein to side of artery using a running 7-0 Prolene suture. Just prior to completion of the anastomosis, everything was fore bled back bled and thoroughly flushed. The anastomosis was secured, vessel loops released, and there was a palpable thrill in the fistula immediately. After hemostasis was obtained, the subcutaneous tissues were reapproximated using a running 3-0 Vicryl suture. The skin was then closed with a 4 Vicryl subcuticular stitch. Dermabond was applied to the skin incision.  The patient had a palpable radial pulse at the end of the case.  Ruta Hinds, MD [facility] Office: [phone] Pager: [phone]

## 2018-03-03 NOTE — Anesthesia Preprocedure Evaluation (Signed)
Anesthesia Evaluation  Patient identified by MRN, date of birth, ID band Patient awake    Reviewed: Allergy & Precautions, NPO status , Patient's Chart, lab work & pertinent test results  Airway Mallampati: II  TM Distance: >3 FB Neck ROM: Full    Dental  (+) Teeth Intact, Dental Advisory Given   Pulmonary former smoker,    breath sounds clear to auscultation       Cardiovascular hypertension,  Rhythm:Regular Rate:Normal     Neuro/Psych    GI/Hepatic   Endo/Other  diabetes  Renal/GU      Musculoskeletal   Abdominal   Peds  Hematology   Anesthesia Other Findings   Reproductive/Obstetrics                             Anesthesia Physical Anesthesia Plan  ASA: III  Anesthesia Plan: MAC   Post-op Pain Management:    Induction: Intravenous  PONV Risk Score and Plan: Ondansetron  Airway Management Planned: Natural Airway and Simple Face Mask  Additional Equipment:   Intra-op Plan:   Post-operative Plan:   Informed Consent: I have reviewed the patients History and Physical, chart, labs and discussed the procedure including the risks, benefits and alternatives for the proposed anesthesia with the patient or authorized representative who has indicated his/her understanding and acceptance.   Dental advisory given  Plan Discussed with: CRNA and Anesthesiologist  Anesthesia Plan Comments:         Anesthesia Quick Evaluation

## 2018-03-03 NOTE — Anesthesia Postprocedure Evaluation (Signed)
Anesthesia Post Note  Patient: Norma [name]  Procedure(s) Performed: BRACHIOCEPHALIC ARTERIOVENOUS FISTULA CREATION LEFT ARM (Left Arm Upper)     Patient location during evaluation: PACU Anesthesia Type: MAC Level of consciousness: awake and alert Pain management: pain level controlled Vital Signs Assessment: post-procedure vital signs reviewed and stable Respiratory status: spontaneous breathing, nonlabored ventilation, respiratory function stable and patient connected to nasal cannula oxygen Cardiovascular status: blood pressure returned to baseline and stable Postop Assessment: no apparent nausea or vomiting Anesthetic complications: no    Last Vitals:  Vitals:   03/03/18 1500 03/03/18 1507  BP:  (!) 157/53  Pulse: (!) 54 (!) 54  Resp: 20 18  Temp: (!) 36.1 C   SpO2: 94% 93%    Last Pain:  Vitals:   03/03/18 1507  TempSrc:   PainSc: 0-No pain                 [name] COKER

## 2018-03-03 NOTE — Transfer of Care (Signed)
Immediate Anesthesia Transfer of Care Note  Patient: Norma [name]  Procedure(s) Performed: BRACHIOCEPHALIC ARTERIOVENOUS FISTULA CREATION LEFT ARM (Left Arm Upper)  Patient Location: PACU  Anesthesia Type:MAC  Level of Consciousness: awake, alert  and oriented  Airway & Oxygen Therapy: Patient Spontanous Breathing  Post-op Assessment: Report given to RN and Post -op Vital signs reviewed and stable  Post vital signs: Reviewed and stable  Last Vitals:  Vitals Value Taken Time  BP 155/54 03/03/2018  2:20 PM  Temp    Pulse 54 03/03/2018  2:22 PM  Resp 18 03/03/2018  2:22 PM  SpO2 95 % 03/03/2018  2:22 PM  Vitals shown include unvalidated device data.  Last Pain:  Vitals:   03/03/18 0955  TempSrc: Oral         Complications: No apparent anesthesia complications

## 2018-03-03 NOTE — Discharge Instructions (Signed)
° °  [facility] ° °Discharge Instructions ° °AV Fistula or Graft Surgery for Dialysis Access ° °Please refer to the following instructions for your post-procedure care. Your surgeon or physician assistant will discuss any changes with you. ° °Activity ° °You may drive the day following your surgery, if you are comfortable and no longer taking prescription pain medication. Resume full activity as the soreness in your incision resolves. ° °Bathing/Showering ° °You may shower after you go home. Keep your incision dry for 48 hours. Do not soak in a bathtub, hot tub, or swim until the incision heals completely. You may not shower if you have a hemodialysis catheter. ° °Incision Care ° °Clean your incision with mild soap and water after 48 hours. Pat the area dry with a clean towel. You do not need a bandage unless otherwise instructed. Do not apply any ointments or creams to your incision. You may have skin glue on your incision. Do not peel it off. It will come off on its own in about one week. Your arm may swell a bit after surgery. To reduce swelling use pillows to elevate your arm so it is above your heart. Your doctor will tell you if you need to lightly wrap your arm with an ACE bandage. ° °Diet ° °Resume your normal diet. There are not special food restrictions following this procedure. In order to heal from your surgery, it is CRITICAL to get adequate nutrition. Your body requires vitamins, minerals, and protein. Vegetables are the best source of vitamins and minerals. Vegetables also provide the perfect balance of protein. Processed food has little nutritional value, so try to avoid this. ° °Medications ° °Resume taking all of your medications. If your incision is causing pain, you may take over-the counter pain relievers such as acetaminophen (Tylenol). If you were prescribed a stronger pain medication, please be aware these medications can cause nausea and constipation. Prevent  nausea by taking the medication with a snack or meal. Avoid constipation by drinking plenty of fluids and eating foods with high amount of fiber, such as fruits, vegetables, and grains. Do not take Tylenol if you are taking prescription pain medications. ° ° ° ° °Follow up °Your surgeon may want to see you in the office following your access surgery. If so, this will be arranged at the time of your surgery. ° °Please call us immediately for any of the following conditions: ° °Increased pain, redness, drainage (pus) from your incision site °Fever of 101 degrees or higher °Severe or worsening pain at your incision site °Hand pain or numbness. ° °Reduce your risk of vascular disease: ° °Stop smoking. If you would like help, call [facility] at 1-800-QUIT-NOW (1-800-784-8669) or [place] at 336-586-4000 ° °Manage your cholesterol °Maintain a desired weight °Control your diabetes °Keep your blood pressure down ° °Dialysis ° °It will take several weeks to several months for your new dialysis access to be ready for use. Your surgeon will determine when it is OK to use it. Your nephrologist will continue to direct your dialysis. You can continue to use your Permcath until your new access is ready for use. ° °If you have any questions, please call the office at [phone]. ° °

## 2018-03-03 NOTE — H&P (Signed)
Referring Physician: Dr. Erling Cruz  Patient name: Norma [name] MRN: [id number] DOB: [birth year] Sex: female  REASON FOR CONSULT: Dialysis access  HPI:  Norma [name] is a 72 y.o. female referred for placement of a long term hemodialysis access. Patient is right-handed. She is not currently on hemodialysis. He has not had any prior access procedures. She is CKD 5 presumably secondary to diabetes. Other chronic medical problems include coronary artery disease diabetes anemia all of which are currently stable.      Past Medical History:  Diagnosis Date  . Anxiety   . Carotid artery occlusion   . CKD (chronic kidney disease) stage 3, GFR 30-59 ml/min (HCC) 04/18/2014  . Claudication ([place])   . Constipation    Due to patient taking iron supplements  . Coronary artery disease   . DDD (degenerative disc disease)   . Diabetes mellitus   . Diabetic coma ([place])   . DJD (degenerative joint disease)   . DJD (degenerative joint disease)   . Edema    Bilateral lower extermities  . Gout   . Hyperlipidemia   . Hypertension   . Iron deficiency anemia   . Leg pain   . Obesity   . PAD (peripheral artery disease) ([place])   . Shortness of breath    exertion        Past Surgical History:  Procedure Laterality Date  . AORTA - BILATERAL FEMORAL ARTERY BYPASS GRAFT  05/25/2011   Procedure: AORTA BIFEMORAL BYPASS GRAFT; Surgeon: Mal Misty, MD; Location: [facility]; Service: Vascular; Laterality: N/A;  . APPLICATION OF WOUND VAC Right 04/23/2016   Procedure: APPLICATION OF WOUND VAC CHANGED; Surgeon: Meredith Pel, MD; Location: [facility]; Service: Orthopedics; Laterality: Right;  . BREAST EXCISIONAL BIOPSY Right   . BREAST SURGERY    . CARDIAC CATHETERIZATION  12/01/2009   EF 65%  . CARDIOVASCULAR STRESS TEST  11/28/2009   EF 75%  . COLONOSCOPY W/ POLYPECTOMY    . CORONARY ARTERY BYPASS GRAFT  12/08/2009   LIMA GRAFT TO THE DISTAL LAD AND SAPHENOUS VEIN GRAFT TO THE OBTUSE MARGINAL VESSEL  . EYE  SURGERY    . I&D EXTREMITY Right 04/23/2016   Procedure: IRRIGATION AND DEBRIDEMENT EXTREMITY; Surgeon: Meredith Pel, MD; Location: [facility]; Service: Orthopedics; Laterality: Right;  . INCISION AND DRAINAGE HIP Right 04/20/2016   Procedure: IRRIGATION AND DEBRIDEMENT HIP WITH POLY EXCHANGE, ABX BEAD PLACEMENT, WOUND VAC ; Surgeon: Meredith Pel, MD; Location: [facility]; Service: Orthopedics; Laterality: Right; RIGHT HIP SUPERFICIAL I&D, POSSIBLE DEEP I&D, LINER EXCHANGE, ABX BEAD PLACEMENT, WOUND VAC.   Marland Kitchen PR VEIN BYPASS GRAFT,AORTO-FEM-POP  05/25/11  . REMOVAL OF FIBROUS CYST FROM RIGHT BREAST  10+ YEARS  . RETINAL DETACHMENT SURGERY  10+ YEARS   LEFT EYE  . TOTAL HIP ARTHROPLASTY Right 03/06/2016  . TOTAL HIP ARTHROPLASTY Right 03/06/2016   Procedure: RIGHT TOTAL HIP ARTHROPLASTY ANTERIOR APPROACH; Surgeon: Meredith Pel, MD; Location: [facility]; Service: Orthopedics; Laterality: Right;  . TRANSTHORACIC ECHOCARDIOGRAM  12/01/2009   EF 60-65%        Family History  Problem Relation Age of Onset  . Hypertension Mother   . Coronary artery disease Father   . Hypertension Sister   . Cirrhosis Brother   . Kidney disease Daughter   . Breast cancer Neg Hx    SOCIAL HISTORY:  Social History        Socioeconomic History  . Marital status: Single    Spouse name:  Not on file  . Number of children: 2  . Years of education: Not on file  . Highest education level: Not on file  Occupational History  . 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 Financial resource strain: Not on file  . Food insecurity:    Worry: Not on file    Inability: Not on file  . Transportation needs:    Medical: Not on file    Non-medical: Not on file  Tobacco Use  . Smoking status: Former Smoker    Years: 20.00    Types: Cigarettes    Last attempt to quit: 07/10/1991    Years since quitting: 26.5  . Smokeless tobacco: Never Used  Substance and Sexual Activity  .  Alcohol use: No  . Drug use: No  . Sexual activity: Never  Lifestyle  . Physical activity:    Days per week: Not on file    Minutes per session: Not on file  . Stress: Not on file  Relationships  . Social connections:    Talks on phone: Not on file    Gets together: Not on file    Attends religious service: Not on file    Active member of club or organization: Not on file    Attends meetings of clubs or organizations: Not on file    Relationship status: Not on file  . Intimate partner violence:    Fear of current or ex partner: Not on file    Emotionally abused: Not on file    Physically abused: Not on file    Forced sexual activity: Not on file  Other Topics Concern  . Not on file  Social History Narrative  . Not on file        Allergies  Allergen Reactions  . Sulfasalazine   . Iohexol Itching and Rash    Code: RASH, Desc: pt called 1 day post scanning stating that skin was red and "itching all over" some what better but still had symptom.. instructed pt to take benadryl to relieve symptoms,per dr Alvester Chou.if any problems call back/mms, Onset Date: [id number]   . Sulfa Antibiotics Other (See Comments)    UNSPECIFIED REACTION          Current Outpatient Medications  Medication Sig Dispense Refill  . acetaminophen (TYLENOL) 325 MG tablet Take by mouth.    Marland Kitchen allopurinol (ZYLOPRIM) 100 MG tablet Take 100 mg by mouth daily as needed.    Marland Kitchen aspirin EC 325 MG EC tablet Take 1 tablet (325 mg total) by mouth daily with breakfast. 30 tablet 0  . atorvastatin (LIPITOR) 80 MG tablet Take 1 tablet (80 mg total) by mouth daily. (Patient taking differently: Take 80 mg by mouth every morning. ) 30 tablet 6  . carvedilol (COREG) 12.5 MG tablet Take 1 tablet (12.5 mg total) by mouth 2 (two) times daily. 180 tablet 3  . cholecalciferol (VITAMIN D) 1000 units tablet Take 1,000 Units by mouth daily as needed (supplement).    . cloNIDine (CATAPRES) 0.3 MG tablet Take 0.3 mg by mouth 2 (two) times  daily.     . Coenzyme Q10 200 MG capsule Take 200 mg by mouth every morning.     . colesevelam (WELCHOL) 625 MG tablet Take by mouth.    . DARBEPOETIN ALFA IJ Inject as directed.    . ferrous sulfate 325 (65 FE) MG tablet Take 325 mg by mouth daily with breakfast.    .  furosemide (LASIX) 80 MG tablet Take 160 mg by mouth daily.     Marland Kitchen glucose blood test strip 1.8 each by Other route as needed for other. Use as instructed    . hydrALAZINE (APRESOLINE) 50 MG tablet Take 50 mg by mouth 3 (three) times daily.    Marland Kitchen HYDROcodone-acetaminophen (NORCO/VICODIN) 5-325 MG tablet Take 1 tablet by mouth every 6 (six) hours as needed for moderate pain. 60 tablet 0  . insulin glargine (LANTUS) 100 UNIT/ML injection Inject 0.2 mLs (20 Units total) into the skin 2 (two) times daily. 10 mL 11  . isosorbide dinitrate (ISORDIL) 20 MG tablet Take 40 mg by mouth 2 (two) times daily.     . Liraglutide (VICTOZA [place]) Inject 1.8 mg into the skin daily.    . Magnesium 250 MG TABS Take 250 mg by mouth every morning.     Marland Kitchen OVER THE COUNTER MEDICATION Over the counter laxative from [facility]    . Probiotic Product (PROBIOTIC DAILY PO) Take 1 tablet by mouth daily as needed (supplement).     . triamcinolone ointment (KENALOG) 0.5 % Apply topically.    . vitamin B-12 (CYANOCOBALAMIN) 500 MCG tablet Take 500 mcg by mouth every morning.      No current facility-administered medications for this visit.    ROS:  General: No weight loss, Fever, chills  HEENT: No recent headaches, no nasal bleeding, no visual changes, no sore throat  Neurologic: No dizziness, blackouts, seizures. No recent symptoms of stroke or mini- stroke. No recent episodes of slurred speech, or temporary blindness.  Cardiac: No recent episodes of chest pain/pressure, no shortness of breath at rest. + shortness of breath with exertion. Denies history of atrial fibrillation or irregular heartbeat  Vascular: No history of rest pain in feet. No history of  claudication. No history of non-healing ulcer, No history of DVT  Pulmonary: No home oxygen, no productive cough, no hemoptysis, No asthma or wheezing  Musculoskeletal: [ ]  Arthritis, [ ]  Low back pain, [ ]  Joint pain  Hematologic:No history of hypercoagulable state. No history of easy bleeding. No history of anemia  Gastrointestinal: No hematochezia or melena, No gastroesophageal reflux, no trouble swallowing  Urinary: [X]  chronic Kidney disease, [ ]  on HD - [ ]  MWF or [ ]  TTHS, [ ]  Burning with urination, [ ]  Frequent urination, [ ]  Difficulty urinating;  Skin: No rashes  Psychological: No history of anxiety, No history of depression  Physical Examination   Vitals:   03/03/18 0955  BP: (!) 142/42  Pulse: (!) 48  Resp: 18  Temp: 97.8 F (36.6 C)  TempSrc: Oral  SpO2: 98%  Weight: 80.3 kg  Height: 5\' 2"  (1.575 m)    General: Alert and oriented, no acute distress  HEENT: Normal  Neck: No bruit or JVD  Pulmonary: Clear to auscultation bilaterally  Cardiac: Regular Rate and Rhythm without murmur  Abdomen: Soft, non-tender, non-distended, no mass  Skin: No rash  Extremity Pulses: 2+ radial, brachial,pulses bilaterally  Musculoskeletal: No deformity or edema  Neurologic: Upper and lower extremity motor 5/5 and symmetric  DATA:  She had a venous duplex of her upper extremities today which showed the vein diameter was greater than 3 mm cephalic and basilic bilaterally. The radial artery was quite small less than 2 mm. Brachial artery was normal caliber.    ASSESSMENT: Patient needs long-term hemodialysis access.    PLAN: Left brachiocephalic AV fistula scheduled for March 03, 2018 risk benefits possible complications and procedure  details including but not limited to bleeding infection ischemic steal non-maturation of the fistula were discussed with the patient today. She understands and agrees to proceed.   Ruta Hinds, MD  [facility]    Office: [phone]  Pager: [phone]

## 2018-03-04 ENCOUNTER — Encounter (HOSPITAL_COMMUNITY): Payer: Self-pay | Admitting: Vascular Surgery

## 2018-03-04 ENCOUNTER — Telehealth: Payer: Self-pay | Admitting: Vascular Surgery

## 2018-03-04 NOTE — Telephone Encounter (Signed)
sch appt mld ltr 04/10/18 1pm Dialysis Duplex 2pm p/o PA

## 2018-03-05 ENCOUNTER — Other Ambulatory Visit: Payer: Self-pay

## 2018-03-05 DIAGNOSIS — N185 Chronic kidney disease, stage 5: Secondary | ICD-10-CM

## 2018-03-06 ENCOUNTER — Inpatient Hospital Stay: Payer: Medicare Other

## 2018-03-06 VITALS — BP 130/45 | HR 42 | Temp 98.1°F | Resp 18

## 2018-03-06 DIAGNOSIS — I129 Hypertensive chronic kidney disease with stage 1 through stage 4 chronic kidney disease, or unspecified chronic kidney disease: Secondary | ICD-10-CM | POA: Diagnosis not present

## 2018-03-06 DIAGNOSIS — D638 Anemia in other chronic diseases classified elsewhere: Secondary | ICD-10-CM

## 2018-03-06 DIAGNOSIS — N184 Chronic kidney disease, stage 4 (severe): Secondary | ICD-10-CM

## 2018-03-06 LAB — IRON AND TIBC
IRON: 53 ug/dL (ref 41–142)
Saturation Ratios: 28 % (ref 21–57)
TIBC: 189 ug/dL — AB (ref 236–444)
UIBC: 135 ug/dL

## 2018-03-06 LAB — CBC WITH DIFFERENTIAL (CANCER CENTER ONLY)
BASOS ABS: 0 10*3/uL (ref 0.0–0.1)
Basophils Relative: 1 %
EOS ABS: 0.2 10*3/uL (ref 0.0–0.5)
EOS PCT: 4 %
HCT: 30 % — ABNORMAL LOW (ref 34.8–46.6)
Hemoglobin: 9.2 g/dL — ABNORMAL LOW (ref 11.6–15.9)
LYMPHS PCT: 11 %
Lymphs Abs: 0.4 10*3/uL — ABNORMAL LOW (ref 0.9–3.3)
MCH: 27.4 pg (ref 25.1–34.0)
MCHC: 30.8 g/dL — ABNORMAL LOW (ref 31.5–36.0)
MCV: 88.9 fL (ref 79.5–101.0)
Monocytes Absolute: 0.3 10*3/uL (ref 0.1–0.9)
Monocytes Relative: 8 %
Neutro Abs: 3.2 10*3/uL (ref 1.5–6.5)
Neutrophils Relative %: 76 %
Platelet Count: 201 10*3/uL (ref 145–400)
RBC: 3.37 MIL/uL — ABNORMAL LOW (ref 3.70–5.45)
RDW: 19.9 % — ABNORMAL HIGH (ref 11.2–14.5)
WBC: 4.1 10*3/uL (ref 3.9–10.3)

## 2018-03-06 LAB — FERRITIN: Ferritin: 180 ng/mL (ref 11–307)

## 2018-03-06 MED ORDER — DARBEPOETIN ALFA 300 MCG/0.6ML IJ SOSY
300.0000 ug | PREFILLED_SYRINGE | Freq: Once | INTRAMUSCULAR | Status: AC
  Administered 2018-03-06: 300 ug via SUBCUTANEOUS

## 2018-03-06 MED ORDER — DARBEPOETIN ALFA 300 MCG/0.6ML IJ SOSY
PREFILLED_SYRINGE | INTRAMUSCULAR | Status: AC
  Filled 2018-03-06: qty 0.6

## 2018-03-06 NOTE — Patient Instructions (Signed)

## 2018-03-26 ENCOUNTER — Other Ambulatory Visit: Payer: Self-pay | Admitting: *Deleted

## 2018-03-26 DIAGNOSIS — D638 Anemia in other chronic diseases classified elsewhere: Secondary | ICD-10-CM

## 2018-03-27 ENCOUNTER — Inpatient Hospital Stay: Payer: Medicare Other | Attending: Internal Medicine

## 2018-03-27 ENCOUNTER — Inpatient Hospital Stay: Payer: Medicare Other

## 2018-03-27 VITALS — BP 130/50 | HR 50 | Temp 97.0°F | Resp 20

## 2018-03-27 DIAGNOSIS — D638 Anemia in other chronic diseases classified elsewhere: Secondary | ICD-10-CM

## 2018-03-27 DIAGNOSIS — I129 Hypertensive chronic kidney disease with stage 1 through stage 4 chronic kidney disease, or unspecified chronic kidney disease: Secondary | ICD-10-CM | POA: Insufficient documentation

## 2018-03-27 DIAGNOSIS — N184 Chronic kidney disease, stage 4 (severe): Secondary | ICD-10-CM

## 2018-03-27 DIAGNOSIS — N189 Chronic kidney disease, unspecified: Secondary | ICD-10-CM | POA: Insufficient documentation

## 2018-03-27 DIAGNOSIS — D631 Anemia in chronic kidney disease: Secondary | ICD-10-CM | POA: Diagnosis present

## 2018-03-27 LAB — CBC WITH DIFFERENTIAL (CANCER CENTER ONLY)
BASOS PCT: 1 %
Basophils Absolute: 0 10*3/uL (ref 0.0–0.1)
Eosinophils Absolute: 0.2 10*3/uL (ref 0.0–0.5)
Eosinophils Relative: 5 %
HEMATOCRIT: 30.1 % — AB (ref 34.8–46.6)
Hemoglobin: 9.3 g/dL — ABNORMAL LOW (ref 11.6–15.9)
LYMPHS PCT: 14 %
Lymphs Abs: 0.4 10*3/uL — ABNORMAL LOW (ref 0.9–3.3)
MCH: 27.6 pg (ref 25.1–34.0)
MCHC: 30.8 g/dL — ABNORMAL LOW (ref 31.5–36.0)
MCV: 89.6 fL (ref 79.5–101.0)
MONOS PCT: 7 %
Monocytes Absolute: 0.2 10*3/uL (ref 0.1–0.9)
NEUTROS ABS: 2.4 10*3/uL (ref 1.5–6.5)
NEUTROS PCT: 73 %
Platelet Count: 204 10*3/uL (ref 145–400)
RBC: 3.36 MIL/uL — ABNORMAL LOW (ref 3.70–5.45)
RDW: 19.3 % — ABNORMAL HIGH (ref 11.2–14.5)
WBC Count: 3.3 10*3/uL — ABNORMAL LOW (ref 3.9–10.3)

## 2018-03-27 LAB — CMP (CANCER CENTER ONLY)
ALK PHOS: 85 U/L (ref 38–126)
AST: 9 U/L — AB (ref 15–41)
Albumin: 2.7 g/dL — ABNORMAL LOW (ref 3.5–5.0)
Anion gap: 13 (ref 5–15)
BUN: 88 mg/dL — ABNORMAL HIGH (ref 8–23)
CALCIUM: 8.2 mg/dL — AB (ref 8.9–10.3)
CO2: 17 mmol/L — AB (ref 22–32)
Chloride: 115 mmol/L — ABNORMAL HIGH (ref 98–111)
Creatinine: 6.51 mg/dL (ref 0.44–1.00)
GFR, EST NON AFRICAN AMERICAN: 6 mL/min — AB (ref >60)
GFR, Est AFR Am: 7 mL/min — ABNORMAL LOW (ref >60)
Glucose, Bld: 140 mg/dL — ABNORMAL HIGH (ref 70–99)
POTASSIUM: 4.9 mmol/L (ref 3.5–5.1)
Sodium: 145 mmol/L (ref 135–145)
TOTAL PROTEIN: 5.8 g/dL — AB (ref 6.5–8.1)
Total Bilirubin: 0.5 mg/dL (ref 0.3–1.2)

## 2018-03-27 MED ORDER — DARBEPOETIN ALFA 300 MCG/0.6ML IJ SOSY
300.0000 ug | PREFILLED_SYRINGE | Freq: Once | INTRAMUSCULAR | Status: AC
  Administered 2018-03-27: 300 ug via SUBCUTANEOUS

## 2018-03-27 NOTE — Patient Instructions (Signed)
Darbepoetin Alfa injection What is this medicine? DARBEPOETIN ALFA (dar be POE e tin AL fa) helps your body make more red blood cells. It is used to treat anemia caused by chronic kidney failure and chemotherapy. This medicine may be used for other purposes; ask your health care provider or pharmacist if you have questions. COMMON BRAND NAME(S): Aranesp What should I tell my health care provider before I take this medicine? They need to know if you have any of these conditions: -blood clotting disorders or history of blood clots -cancer patient not on chemotherapy -cystic fibrosis -heart disease, such as angina, heart failure, or a history of a heart attack -hemoglobin level of 12 g/dL or greater -high blood pressure -low levels of folate, iron, or vitamin B12 -seizures -an unusual or allergic reaction to darbepoetin, erythropoietin, albumin, hamster proteins, latex, other medicines, foods, dyes, or preservatives -pregnant or trying to get pregnant -breast-feeding How should I use this medicine? This medicine is for injection into a vein or under the skin. It is usually given by a health care professional in a hospital or clinic setting. If you get this medicine at home, you will be taught how to prepare and give this medicine. Use exactly as directed. Take your medicine at regular intervals. Do not take your medicine more often than directed. It is important that you put your used needles and syringes in a special sharps container. Do not put them in a trash can. If you do not have a sharps container, call your pharmacist or healthcare provider to get one. A special MedGuide will be given to you by the pharmacist with each prescription and refill. Be sure to read this information carefully each time. Talk to your pediatrician regarding the use of this medicine in children. While this medicine may be used in children as young as 1 year for selected conditions, precautions do  apply. Overdosage: If you think you have taken too much of this medicine contact a poison control center or emergency room at once. NOTE: This medicine is only for you. Do not share this medicine with others. What if I miss a dose? If you miss a dose, take it as soon as you can. If it is almost time for your next dose, take only that dose. Do not take double or extra doses. What may interact with this medicine? Do not take this medicine with any of the following medications: -epoetin alfa This list may not describe all possible interactions. Give your health care provider a list of all the medicines, herbs, non-prescription drugs, or dietary supplements you use. Also tell them if you smoke, drink alcohol, or use illegal drugs. Some items may interact with your medicine. What should I watch for while using this medicine? Your condition will be monitored carefully while you are receiving this medicine. You may need blood work done while you are taking this medicine. What side effects may I notice from receiving this medicine? Side effects that you should report to your doctor or health care professional as soon as possible: -allergic reactions like skin rash, itching or hives, swelling of the face, lips, or tongue -breathing problems -changes in vision -chest pain -confusion, trouble speaking or understanding -feeling faint or lightheaded, falls -high blood pressure -muscle aches or pains -pain, swelling, warmth in the leg -rapid weight gain -severe headaches -sudden numbness or weakness of the face, arm or leg -trouble walking, dizziness, loss of balance or coordination -seizures (convulsions) -swelling of the ankles, feet, hands -  unusually weak or tired Side effects that usually do not require medical attention (report to your doctor or health care professional if they continue or are bothersome): -diarrhea -fever, chills (flu-like symptoms) -headaches -nausea, vomiting -redness,  stinging, or swelling at site where injected This list may not describe all possible side effects. Call your doctor for medical advice about side effects. You may report side effects to FDA at 1-800-FDA-1088. Where should I keep my medicine? Keep out of the reach of children. Store in a refrigerator between 2 and 8 degrees C (36 and 46 degrees F). Do not freeze. Do not shake. Throw away any unused portion if using a single-dose vial. Throw away any unused medicine after the expiration date. NOTE: This sheet is a summary. It may not cover all possible information. If you have questions about this medicine, talk to your doctor, pharmacist, or health care provider.  2018 Elsevier/Gold Standard (2016-02-13 19:52:26)  Iron-Rich Diet Iron is a mineral that helps your body to produce hemoglobin. Hemoglobin is a protein in your red blood cells that carries oxygen to your body's tissues. Eating too little iron may cause you to feel weak and tired, and it can increase your risk for infection. Eating enough iron is necessary for your body's metabolism, muscle function, and nervous system. Iron is naturally found in many foods. It can also be added to foods or fortified in foods. There are two types of dietary iron:  Heme iron. Heme iron is absorbed by the body more easily than nonheme iron. Heme iron is found in meat, poultry, and fish.  Nonheme iron. Nonheme iron is found in dietary supplements, iron-fortified grains, beans, and vegetables.  You may need to follow an iron-rich diet if:  You have been diagnosed with iron deficiency or iron-deficiency anemia.  You have a condition that prevents you from absorbing dietary iron, such as: ? Infection in your intestines. ? Celiac disease. This involves long-lasting (chronic) inflammation of your intestines.  You do not eat enough iron.  You eat a diet that is high in foods that impair iron absorption.  You have lost a lot of blood.  You have heavy  bleeding during your menstrual cycle.  You are pregnant.  What is my plan? Your health care provider may help you to determine how much iron you need per day based on your condition. Generally, when a person consumes sufficient amounts of iron in the diet, the following iron needs are met:  Men. ? 27-69 years old: 11 mg per day. ? 15-68 years old: 8 mg per day.  Women. ? 75-17 years old: 15 mg per day. ? 86-42 years old: 18 mg per day. ? Over 22 years old: 8 mg per day. ? Pregnant women: 27 mg per day. ? Breastfeeding women: 9 mg per day.  What do I need to know about an iron-rich diet?  Eat fresh fruits and vegetables that are high in vitamin C along with foods that are high in iron. This will help increase the amount of iron that your body absorbs from food, especially with foods containing nonheme iron. Foods that are high in vitamin C include oranges, peppers, tomatoes, and mango.  Take iron supplements only as directed by your health care provider. Overdose of iron can be life-threatening. If you were prescribed iron supplements, take them with orange juice or a vitamin C supplement.  Cook foods in pots and pans that are made from iron.  Eat nonheme iron-containing foods alongside foods  that are high in heme iron. This helps to improve your iron absorption.  Certain foods and drinks contain compounds that impair iron absorption. Avoid eating these foods in the same meal as iron-rich foods or with iron supplements. These include: ? Coffee, black tea, and red wine. ? Milk, dairy products, and foods that are high in calcium. ? Beans, soybeans, and peas. ? Whole grains.  When eating foods that contain both nonheme iron and compounds that impair iron absorption, follow these tips to absorb iron better. ? Soak beans overnight before cooking. ? Soak whole grains overnight and drain them before using. ? Ferment flours before baking, such as using yeast in bread dough. What foods  can I eat? Grains Iron-fortified breakfast cereal. Iron-fortified whole-wheat bread. Enriched rice. Sprouted grains. Vegetables Spinach. Potatoes with skin. Green peas. Broccoli. Red and green bell peppers. Fermented vegetables. Fruits Prunes. Raisins. Oranges. Strawberries. Mango. Grapefruit. Meats and Other Protein Sources Beef liver. Oysters. Beef. Shrimp. [place]. Chicken. [place]. Sardines. Chickpeas. Nuts. Tofu. Beverages Tomato juice. Fresh orange juice. Prune juice. Hibiscus tea. Fortified instant breakfast shakes. Condiments Tahini. Fermented soy sauce. Sweets and Desserts Black-strap molasses. Other Wheat germ. The items listed above may not be a complete list of recommended foods or beverages. Contact your dietitian for more options. What foods are not recommended? Grains Whole grains. Bran cereal. Bran flour. Oats. Vegetables Artichokes. Brussels sprouts. Kale. Fruits Blueberries. Raspberries. Strawberries. Figs. Meats and Other Protein Sources Soybeans. Products made from soy protein. Dairy Milk. Cream. Cheese. Yogurt. Cottage cheese. Beverages Coffee. Black tea. Red wine. Sweets and Desserts Cocoa. Chocolate. Ice cream. Other Basil. Oregano. Parsley. The items listed above may not be a complete list of foods and beverages to avoid. Contact your dietitian for more information. This information is not intended to replace advice given to you by your health care provider. Make sure you discuss any questions you have with your health care provider. Document Released: 02/06/2005 Document Revised: 01/13/2016 Document Reviewed: 01/20/2014 Elsevier Interactive Patient Education  [employer].

## 2018-04-03 ENCOUNTER — Other Ambulatory Visit: Payer: Self-pay | Admitting: Nurse Practitioner

## 2018-04-03 DIAGNOSIS — K7469 Other cirrhosis of liver: Secondary | ICD-10-CM

## 2018-04-10 ENCOUNTER — Ambulatory Visit (INDEPENDENT_AMBULATORY_CARE_PROVIDER_SITE_OTHER): Payer: Self-pay | Admitting: Physician Assistant

## 2018-04-10 ENCOUNTER — Ambulatory Visit (HOSPITAL_COMMUNITY)
Admission: RE | Admit: 2018-04-10 | Discharge: 2018-04-10 | Disposition: A | Discharge status: Home / Self Care | Payer: Medicare Other | Source: Ambulatory Visit | Attending: Vascular Surgery | Admitting: Vascular Surgery

## 2018-04-10 VITALS — BP 146/54 | HR 42 | Temp 97.6°F | Resp 16 | Ht 62.0 in | Wt 185.5 lb

## 2018-04-10 DIAGNOSIS — Z48812 Encounter for surgical aftercare following surgery on the circulatory system: Secondary | ICD-10-CM | POA: Diagnosis not present

## 2018-04-10 DIAGNOSIS — N184 Chronic kidney disease, stage 4 (severe): Secondary | ICD-10-CM

## 2018-04-10 DIAGNOSIS — N185 Chronic kidney disease, stage 5: Secondary | ICD-10-CM

## 2018-04-10 NOTE — Progress Notes (Signed)
    Postoperative Access Visit   History of Present Illness   Norma [name] is a 72 y.o. year old female who presents for postoperative follow-up for: left brachiocephalic arteriovenous fistula by Dr. Oneida Alar (Date: 03/03/18).  The patient's wounds are healed.  The patient denies steal symptoms.  The patient is able to complete their activities of daily living.  The patient is not yet on hemodialysis.  She is following up with Dr. Florene Glen for management of CKD about every 6 weeks.  Physical Examination   Vitals:   04/10/18 1419  BP: (!) 146/54  Pulse: (!) 42  Resp: 16  Temp: 97.6 F (36.4 C)  TempSrc: Oral  SpO2: 98%  Weight: 185 lb 8 oz (84.1 kg)  Height: 5\' 2"  (1.575 m)   Body mass index is 33.93 kg/m.  left arm Incision is healed, hand grip is 5/5, sensation in digits is intact, palpable thrill near [facility] fossa, bruit can be auscultated; palpable L radial pulse     Medical Decision Making   Norma [name] is a 72 y.o. year old female who presents s/p left brachiocephalic arteriovenous fistula   Based on duplex and physical exam, L brachiocephalic fistula appears to be slightly small in diameter and slightly deep  Because the patient is not yet on dialysis we will allow more time for fistula to mature  We will repeat fistula duplex at the 12 week post op mark  Patient is aware she may require superficialization of fistula prior to using fistula for HD   Dagoberto Ligas PA-C [facility] Office: [phone]

## 2018-04-14 ENCOUNTER — Ambulatory Visit
Admission: RE | Admit: 2018-04-14 | Discharge: 2018-04-14 | Disposition: A | Discharge status: Home / Self Care | Payer: Medicare Other | Source: Ambulatory Visit | Attending: Nurse Practitioner | Admitting: Nurse Practitioner

## 2018-04-14 DIAGNOSIS — K7469 Other cirrhosis of liver: Secondary | ICD-10-CM

## 2018-04-15 ENCOUNTER — Other Ambulatory Visit: Payer: Self-pay

## 2018-04-15 DIAGNOSIS — N184 Chronic kidney disease, stage 4 (severe): Secondary | ICD-10-CM

## 2018-04-17 ENCOUNTER — Inpatient Hospital Stay: Payer: Medicare Other | Attending: Internal Medicine

## 2018-04-17 ENCOUNTER — Other Ambulatory Visit: Payer: Self-pay | Admitting: *Deleted

## 2018-04-17 ENCOUNTER — Inpatient Hospital Stay: Payer: Medicare Other

## 2018-04-17 VITALS — BP 150/82 | HR 50 | Temp 98.3°F | Resp 16

## 2018-04-17 DIAGNOSIS — Z79899 Other long term (current) drug therapy: Secondary | ICD-10-CM | POA: Insufficient documentation

## 2018-04-17 DIAGNOSIS — D638 Anemia in other chronic diseases classified elsewhere: Secondary | ICD-10-CM

## 2018-04-17 DIAGNOSIS — N189 Chronic kidney disease, unspecified: Secondary | ICD-10-CM | POA: Insufficient documentation

## 2018-04-17 DIAGNOSIS — I129 Hypertensive chronic kidney disease with stage 1 through stage 4 chronic kidney disease, or unspecified chronic kidney disease: Secondary | ICD-10-CM | POA: Insufficient documentation

## 2018-04-17 DIAGNOSIS — N184 Chronic kidney disease, stage 4 (severe): Secondary | ICD-10-CM

## 2018-04-17 DIAGNOSIS — D631 Anemia in chronic kidney disease: Secondary | ICD-10-CM | POA: Diagnosis not present

## 2018-04-17 LAB — CMP (CANCER CENTER ONLY)
ALT: 15 U/L (ref 0–44)
AST: 15 U/L (ref 15–41)
Albumin: 2.9 g/dL — ABNORMAL LOW (ref 3.5–5.0)
Alkaline Phosphatase: 101 U/L (ref 38–126)
Anion gap: 13 (ref 5–15)
BILIRUBIN TOTAL: 0.7 mg/dL (ref 0.3–1.2)
BUN: 91 mg/dL — AB (ref 8–23)
CHLORIDE: 114 mmol/L — AB (ref 98–111)
CO2: 17 mmol/L — ABNORMAL LOW (ref 22–32)
Calcium: 7.9 mg/dL — ABNORMAL LOW (ref 8.9–10.3)
Creatinine: 5.95 mg/dL (ref 0.44–1.00)
GFR, Est AFR Am: 7 mL/min — ABNORMAL LOW (ref >60)
GFR, Estimated: 6 mL/min — ABNORMAL LOW (ref >60)
GLUCOSE: 119 mg/dL — AB (ref 70–99)
POTASSIUM: 4.5 mmol/L (ref 3.5–5.1)
Sodium: 144 mmol/L (ref 135–145)
Total Protein: 6 g/dL — ABNORMAL LOW (ref 6.5–8.1)

## 2018-04-17 LAB — CBC WITH DIFFERENTIAL (CANCER CENTER ONLY)
ABS IMMATURE GRANULOCYTES: 0.02 10*3/uL (ref 0.00–0.07)
Basophils Absolute: 0 10*3/uL (ref 0.0–0.1)
Basophils Relative: 0 %
Eosinophils Absolute: 0.2 10*3/uL (ref 0.0–0.5)
Eosinophils Relative: 4 %
HCT: 31.9 % — ABNORMAL LOW (ref 36.0–46.0)
Hemoglobin: 9.5 g/dL — ABNORMAL LOW (ref 12.0–15.0)
Immature Granulocytes: 0 %
LYMPHS PCT: 9 %
Lymphs Abs: 0.4 10*3/uL — ABNORMAL LOW (ref 0.7–4.0)
MCH: 27.6 pg (ref 26.0–34.0)
MCHC: 29.8 g/dL — AB (ref 30.0–36.0)
MCV: 92.7 fL (ref 80.0–100.0)
MONO ABS: 0.4 10*3/uL (ref 0.1–1.0)
MONOS PCT: 9 %
NEUTROS ABS: 3.5 10*3/uL (ref 1.7–7.7)
Neutrophils Relative %: 78 %
PLATELETS: 188 10*3/uL (ref 150–400)
RBC: 3.44 MIL/uL — ABNORMAL LOW (ref 3.87–5.11)
RDW: 18.8 % — AB (ref 11.5–15.5)
WBC Count: 4.6 10*3/uL (ref 4.0–10.5)
nRBC: 0 % (ref 0.0–0.2)

## 2018-04-17 MED ORDER — DARBEPOETIN ALFA 300 MCG/0.6ML IJ SOSY
PREFILLED_SYRINGE | INTRAMUSCULAR | Status: AC
  Filled 2018-04-17: qty 0.6

## 2018-04-17 MED ORDER — DARBEPOETIN ALFA 300 MCG/0.6ML IJ SOSY
300.0000 ug | PREFILLED_SYRINGE | Freq: Once | INTRAMUSCULAR | Status: AC
  Administered 2018-04-17: 300 ug via SUBCUTANEOUS

## 2018-04-17 NOTE — Patient Instructions (Signed)

## 2018-05-08 ENCOUNTER — Inpatient Hospital Stay: Payer: Medicare Other

## 2018-05-08 VITALS — BP 138/58 | HR 68 | Temp 98.2°F | Resp 16

## 2018-05-08 DIAGNOSIS — D638 Anemia in other chronic diseases classified elsewhere: Secondary | ICD-10-CM

## 2018-05-08 DIAGNOSIS — I129 Hypertensive chronic kidney disease with stage 1 through stage 4 chronic kidney disease, or unspecified chronic kidney disease: Secondary | ICD-10-CM | POA: Diagnosis not present

## 2018-05-08 DIAGNOSIS — N184 Chronic kidney disease, stage 4 (severe): Secondary | ICD-10-CM

## 2018-05-08 LAB — CBC WITH DIFFERENTIAL (CANCER CENTER ONLY)
ABS IMMATURE GRANULOCYTES: 0.02 10*3/uL (ref 0.00–0.07)
BASOS PCT: 0 %
Basophils Absolute: 0 10*3/uL (ref 0.0–0.1)
EOS ABS: 0.2 10*3/uL (ref 0.0–0.5)
Eosinophils Relative: 3 %
HCT: 32.2 % — ABNORMAL LOW (ref 36.0–46.0)
Hemoglobin: 9.6 g/dL — ABNORMAL LOW (ref 12.0–15.0)
IMMATURE GRANULOCYTES: 0 %
Lymphocytes Relative: 9 %
Lymphs Abs: 0.4 10*3/uL — ABNORMAL LOW (ref 0.7–4.0)
MCH: 27.4 pg (ref 26.0–34.0)
MCHC: 29.8 g/dL — ABNORMAL LOW (ref 30.0–36.0)
MCV: 92 fL (ref 80.0–100.0)
MONO ABS: 0.4 10*3/uL (ref 0.1–1.0)
Monocytes Relative: 8 %
NEUTROS ABS: 3.8 10*3/uL (ref 1.7–7.7)
NEUTROS PCT: 80 %
PLATELETS: 167 10*3/uL (ref 150–400)
RBC: 3.5 MIL/uL — AB (ref 3.87–5.11)
RDW: 17.2 % — ABNORMAL HIGH (ref 11.5–15.5)
WBC: 4.7 10*3/uL (ref 4.0–10.5)
nRBC: 0 % (ref 0.0–0.2)

## 2018-05-08 LAB — FERRITIN: Ferritin: 156 ng/mL (ref 11–307)

## 2018-05-08 LAB — IRON AND TIBC
IRON: 27 ug/dL — AB (ref 41–142)
Saturation Ratios: 15 % — ABNORMAL LOW (ref 21–57)
TIBC: 173 ug/dL — ABNORMAL LOW (ref 236–444)
UIBC: 146 ug/dL (ref 120–384)

## 2018-05-08 MED ORDER — DARBEPOETIN ALFA 300 MCG/0.6ML IJ SOSY
300.0000 ug | PREFILLED_SYRINGE | Freq: Once | INTRAMUSCULAR | Status: AC
  Administered 2018-05-08: 300 ug via SUBCUTANEOUS

## 2018-05-08 MED ORDER — DARBEPOETIN ALFA 300 MCG/0.6ML IJ SOSY
PREFILLED_SYRINGE | INTRAMUSCULAR | Status: AC
  Filled 2018-05-08: qty 0.6

## 2018-05-21 ENCOUNTER — Other Ambulatory Visit: Payer: Self-pay

## 2018-05-21 ENCOUNTER — Ambulatory Visit (INDEPENDENT_AMBULATORY_CARE_PROVIDER_SITE_OTHER): Payer: Self-pay | Admitting: Physician Assistant

## 2018-05-21 ENCOUNTER — Ambulatory Visit (HOSPITAL_COMMUNITY)
Admission: RE | Admit: 2018-05-21 | Discharge: 2018-05-21 | Disposition: A | Discharge status: Home / Self Care | Payer: Medicare Other | Source: Ambulatory Visit | Attending: Family Medicine | Admitting: Family Medicine

## 2018-05-21 VITALS — BP 149/53 | HR 55 | Resp 16 | Ht 62.0 in | Wt 185.0 lb

## 2018-05-21 DIAGNOSIS — N184 Chronic kidney disease, stage 4 (severe): Secondary | ICD-10-CM

## 2018-05-21 NOTE — Progress Notes (Signed)
POST OPERATIVE OFFICE NOTE    CC:  F/u for surgery  HPI:  This is a 72 y.o. female who is s/p left brachial cephalic av fistula creation.  She is not on HD.  She denise pain, loss of motor or loss of sensation.  She has had no changes in her medical history since she was last seen.  Allergies  Allergen Reactions  . Sulfasalazine     UNSPECIFIED REACTION   . Iohexol Itching, Rash and Other (See Comments)     Code: RASH, Desc: pt called 1 day post scanning stating that skin was red and "itching all over" some what better but still had symptom.. instructed pt to take benadryl to relieve symptoms,per dr Alvester Chou.if any problems call back/mms, Onset Date: [id number]   . Sulfa Antibiotics Other (See Comments)    UNSPECIFIED REACTION     Current Outpatient Medications  Medication Sig Dispense Refill  . acetaminophen (TYLENOL) 500 MG tablet Take 500 mg by mouth every 6 (six) hours as needed for moderate pain or headache.     . allopurinol (ZYLOPRIM) 100 MG tablet Take 100 mg by mouth daily as needed (for gout).     Marland Kitchen aspirin EC 81 MG tablet Take 81 mg by mouth daily.    . calcitRIOL (ROCALTROL) 0.25 MCG capsule Take 0.25 mcg by mouth daily.    . calcium acetate (PHOSLO) 667 MG capsule TAKE 1 CAPSULE BY MOUTH THREE TIMES DAILY BEFORE MEAL(S)  6  . carvedilol (COREG) 12.5 MG tablet Take 1 tablet (12.5 mg total) by mouth 2 (two) times daily. 180 tablet 3  . Cholecalciferol (VITAMIN D) 2000 units tablet Take by mouth.    . cloNIDine (CATAPRES) 0.3 MG tablet Take 0.3 mg by mouth 2 (two) times daily.     . Coenzyme Q10 300 MG CAPS Take 300 mg by mouth daily.     . colesevelam (WELCHOL) 625 MG tablet Take 1,875 mg by mouth 2 (two) times daily with a meal.     . ferrous sulfate 325 (65 FE) MG tablet Take 325 mg by mouth 2 (two) times daily.     . furosemide (LASIX) 80 MG tablet Take 160 mg by mouth daily.     Marland Kitchen glucose blood (PRECISION QID TEST) test strip by Other route Three (3) times a day.    .  hydrALAZINE (APRESOLINE) 50 MG tablet Take 50 mg by mouth 3 (three) times daily.    Marland Kitchen HYDROcodone-acetaminophen (NORCO/VICODIN) 5-325 MG tablet Take 1 tablet by mouth every 6 (six) hours as needed for moderate pain. 6 tablet 0  . insulin glargine (LANTUS) 100 UNIT/ML injection Inject 0.2 mLs (20 Units total) into the skin 2 (two) times daily. (Patient taking differently: Inject 20-24 Units into the skin See admin instructions. Inject 24 units SQ in the morning and inject 20 units SQ in the evening) 10 mL 11  . isosorbide dinitrate (ISORDIL) 20 MG tablet Take 40 mg by mouth 2 (two) times daily.     Marland Kitchen liraglutide (VICTOZA) 18 MG/3ML SOPN Inject 1.8 mg into the skin daily.     . Multiple Vitamins-Minerals (MULTIVITAMIN PO) Take 1 tablet by mouth daily.    Marland Kitchen omeprazole (PRILOSEC) 40 MG capsule     . Probiotic Product (TRUNATURE DIGESTIVE PROBIOTIC) CAPS Take by mouth.    . promethazine (PHENERGAN) 12.5 MG tablet     . Sennosides 25 MG TABS Take 25 mg by mouth daily as needed (for constipation).    . vitamin  B-12 (CYANOCOBALAMIN) 500 MCG tablet Take by mouth.     No current facility-administered medications for this visit.      ROS:  See HPI  Physical Exam:  Vitals:   05/21/18 1341  BP: (!) 149/53  Pulse: (!) 55  Resp: 16  SpO2: 95%    Incision:  Well healed incision at the [facility] Extremities:  Palpable thrill in the fistula, palpable radial pulse, sensation and motor intact left UE    Assessment/Plan:  This is a 72 y.o. female who is s/p:left BC fistula creation CKD stage IV/V  She is not on HD at this time.  Fistula duplex performed 05/21/2018 shows a well maturing fistula with diameter> 0.6 cm, bu the depth is > 0.6cm at the mid arm and above.  She will need superficialization of the fistula at some point if she gets closer to needing HD.  At this time we will wait until her Nephrologist deems the need to start HD.     Roxy Horseman PA-C [facility] [phone]

## 2018-05-28 ENCOUNTER — Other Ambulatory Visit: Payer: Self-pay | Admitting: *Deleted

## 2018-05-28 DIAGNOSIS — D638 Anemia in other chronic diseases classified elsewhere: Secondary | ICD-10-CM

## 2018-05-29 ENCOUNTER — Inpatient Hospital Stay: Payer: Medicare Other | Attending: Internal Medicine

## 2018-05-29 ENCOUNTER — Inpatient Hospital Stay: Payer: Medicare Other

## 2018-05-29 ENCOUNTER — Inpatient Hospital Stay: Payer: Medicare Other | Admitting: Internal Medicine

## 2018-05-29 MED ORDER — DARBEPOETIN ALFA 300 MCG/0.6ML IJ SOSY
PREFILLED_SYRINGE | INTRAMUSCULAR | Status: AC
  Filled 2018-05-29: qty 0.6

## 2018-06-18 ENCOUNTER — Telehealth: Payer: Self-pay | Admitting: Internal Medicine

## 2018-06-18 NOTE — Telephone Encounter (Signed)
Scheduled appt per 12/11 sch message - pt is aware of appt date and time   

## 2018-06-19 ENCOUNTER — Inpatient Hospital Stay: Payer: Medicare Other

## 2018-06-23 ENCOUNTER — Ambulatory Visit: Payer: Medicare Other

## 2018-06-25 ENCOUNTER — Ambulatory Visit: Payer: Medicare Other

## 2018-06-27 ENCOUNTER — Inpatient Hospital Stay: Payer: Medicare Other

## 2018-06-27 ENCOUNTER — Inpatient Hospital Stay: Payer: Medicare Other | Attending: Internal Medicine

## 2018-06-27 ENCOUNTER — Telehealth: Payer: Self-pay | Admitting: *Deleted

## 2018-06-27 VITALS — BP 148/46

## 2018-06-27 DIAGNOSIS — N189 Chronic kidney disease, unspecified: Secondary | ICD-10-CM | POA: Insufficient documentation

## 2018-06-27 DIAGNOSIS — D638 Anemia in other chronic diseases classified elsewhere: Secondary | ICD-10-CM

## 2018-06-27 DIAGNOSIS — Z79899 Other long term (current) drug therapy: Secondary | ICD-10-CM | POA: Insufficient documentation

## 2018-06-27 DIAGNOSIS — I129 Hypertensive chronic kidney disease with stage 1 through stage 4 chronic kidney disease, or unspecified chronic kidney disease: Secondary | ICD-10-CM | POA: Diagnosis present

## 2018-06-27 DIAGNOSIS — D631 Anemia in chronic kidney disease: Secondary | ICD-10-CM | POA: Insufficient documentation

## 2018-06-27 DIAGNOSIS — N184 Chronic kidney disease, stage 4 (severe): Secondary | ICD-10-CM

## 2018-06-27 LAB — CBC WITH DIFFERENTIAL (CANCER CENTER ONLY)
ABS IMMATURE GRANULOCYTES: 0.03 10*3/uL (ref 0.00–0.07)
BASOS ABS: 0 10*3/uL (ref 0.0–0.1)
Basophils Relative: 0 %
EOS PCT: 2 %
Eosinophils Absolute: 0.1 10*3/uL (ref 0.0–0.5)
HEMATOCRIT: 28.6 % — AB (ref 36.0–46.0)
Hemoglobin: 8.3 g/dL — ABNORMAL LOW (ref 12.0–15.0)
Immature Granulocytes: 1 %
LYMPHS ABS: 0.4 10*3/uL — AB (ref 0.7–4.0)
Lymphocytes Relative: 8 %
MCH: 26.9 pg (ref 26.0–34.0)
MCHC: 29 g/dL — ABNORMAL LOW (ref 30.0–36.0)
MCV: 92.6 fL (ref 80.0–100.0)
MONO ABS: 0.3 10*3/uL (ref 0.1–1.0)
Monocytes Relative: 6 %
NEUTROS ABS: 4.3 10*3/uL (ref 1.7–7.7)
Neutrophils Relative %: 83 %
Platelet Count: 224 10*3/uL (ref 150–400)
RBC: 3.09 MIL/uL — ABNORMAL LOW (ref 3.87–5.11)
RDW: 17.3 % — ABNORMAL HIGH (ref 11.5–15.5)
WBC Count: 5.1 10*3/uL (ref 4.0–10.5)
nRBC: 0 % (ref 0.0–0.2)

## 2018-06-27 LAB — CMP (CANCER CENTER ONLY)
ALBUMIN: 2.7 g/dL — AB (ref 3.5–5.0)
ALT: 8 U/L (ref 0–44)
AST: 10 U/L — AB (ref 15–41)
Alkaline Phosphatase: 88 U/L (ref 38–126)
Anion gap: 14 (ref 5–15)
BUN: 86 mg/dL — AB (ref 8–23)
CO2: 21 mmol/L — AB (ref 22–32)
CREATININE: 6.56 mg/dL — AB (ref 0.44–1.00)
Calcium: 8.7 mg/dL — ABNORMAL LOW (ref 8.9–10.3)
Chloride: 109 mmol/L (ref 98–111)
GFR, Est AFR Am: 7 mL/min — ABNORMAL LOW (ref >60)
GFR, Estimated: 6 mL/min — ABNORMAL LOW (ref >60)
Glucose, Bld: 228 mg/dL — ABNORMAL HIGH (ref 70–99)
POTASSIUM: 4.6 mmol/L (ref 3.5–5.1)
SODIUM: 144 mmol/L (ref 135–145)
Total Bilirubin: 0.6 mg/dL (ref 0.3–1.2)
Total Protein: 6.4 g/dL — ABNORMAL LOW (ref 6.5–8.1)

## 2018-06-27 LAB — IRON AND TIBC
Iron: 32 ug/dL — ABNORMAL LOW (ref 41–142)
Saturation Ratios: 17 % — ABNORMAL LOW (ref 21–57)
TIBC: 190 ug/dL — AB (ref 236–444)
UIBC: 158 ug/dL (ref 120–384)

## 2018-06-27 LAB — FERRITIN: Ferritin: 274 ng/mL (ref 11–307)

## 2018-06-27 MED ORDER — DARBEPOETIN ALFA 300 MCG/0.6ML IJ SOSY
PREFILLED_SYRINGE | INTRAMUSCULAR | Status: AC
  Filled 2018-06-27: qty 0.6

## 2018-06-27 MED ORDER — DARBEPOETIN ALFA 300 MCG/0.6ML IJ SOSY
300.0000 ug | PREFILLED_SYRINGE | Freq: Once | INTRAMUSCULAR | Status: AC
  Administered 2018-06-27: 300 ug via SUBCUTANEOUS

## 2018-06-27 NOTE — Telephone Encounter (Signed)
Received lab results with elevated creatinine of 6.56 Pt is Stg 4 kidney disease. This is baseline for her.

## 2018-06-30 ENCOUNTER — Encounter: Payer: Self-pay | Admitting: Surgery

## 2018-06-30 ENCOUNTER — Telehealth: Payer: Self-pay | Admitting: *Deleted

## 2018-06-30 ENCOUNTER — Ambulatory Visit (INDEPENDENT_AMBULATORY_CARE_PROVIDER_SITE_OTHER): Payer: Medicare Other | Admitting: Physician Assistant

## 2018-06-30 ENCOUNTER — Other Ambulatory Visit: Payer: Self-pay

## 2018-06-30 ENCOUNTER — Telehealth: Payer: Self-pay | Admitting: Internal Medicine

## 2018-06-30 VITALS — BP 152/54 | HR 46 | Temp 97.1°F | Resp 20 | Ht 62.0 in | Wt 181.0 lb

## 2018-06-30 DIAGNOSIS — N185 Chronic kidney disease, stage 5: Secondary | ICD-10-CM | POA: Diagnosis not present

## 2018-06-30 NOTE — Telephone Encounter (Signed)
Spoke with patient re next appointment for January 9th at 11:30 am. Patient will get updated scheduled at January 9th visit. Per staff message response from MM patient to continue lab/inj and see him at next available opening.

## 2018-06-30 NOTE — H&P (View-Only) (Signed)
[facility]  HISTORY AND PHYSICAL   History of Present Illness:  Patient is a 72 y.o. year old female who presents for follow up s/p Left Brachial Cephalic AV fistula.  She is no currently on HD.  On her last f/u the duplex revealed proximal and mid fistula are too deep 1.19 cm and 1.12cm.  The diameter is acceptable.  She wished to wait before returning to the OR for further surgery.  She denise pain, loss of motor and numbness in the left UE.  Past Medical History:  Diagnosis Date  . Anxiety   . Carotid artery occlusion   . CKD (chronic kidney disease) stage 3, GFR 30-59 ml/min (HCC) 04/18/2014  . Claudication ([place])   . Constipation    Due to patient taking iron supplements  . Coronary artery disease   . DDD (degenerative disc disease)   . Diabetes mellitus   . Diabetic coma ([place])   . DJD (degenerative joint disease)   . DJD (degenerative joint disease)   . Edema    Bilateral lower extermities  . Gout   . Hepatitis    Hep C  . Hyperlipidemia   . Hypertension   . Iron deficiency anemia   . Leg pain   . Obesity   . PAD (peripheral artery disease) ([place])   . Shortness of breath    exertion    Past Surgical History:  Procedure Laterality Date  . AORTA - BILATERAL FEMORAL ARTERY BYPASS GRAFT  05/25/2011   Procedure: AORTA BIFEMORAL BYPASS GRAFT;  Surgeon: Mal Misty, MD;  Location: [place];  Service: Vascular;  Laterality: N/A;  . APPLICATION OF WOUND VAC Right 04/23/2016   Procedure: APPLICATION OF WOUND VAC CHANGED;  Surgeon: Meredith Pel, MD;  Location: [place];  Service: Orthopedics;  Laterality: Right;  . AV FISTULA PLACEMENT Left 03/03/2018   Procedure: BRACHIOCEPHALIC ARTERIOVENOUS FISTULA CREATION LEFT ARM;  Surgeon: Elam Dutch, MD;  Location: [place];  Service: Vascular;  Laterality: Left;  . BREAST EXCISIONAL BIOPSY Right   . BREAST SURGERY    . CARDIAC CATHETERIZATION  12/01/2009   EF 65%  . CARDIOVASCULAR STRESS TEST   11/28/2009   EF 75%  . COLONOSCOPY W/ POLYPECTOMY    . CORONARY ARTERY BYPASS GRAFT  12/08/2009   LIMA GRAFT TO THE DISTAL LAD AND SAPHENOUS VEIN GRAFT TO THE OBTUSE MARGINAL VESSEL  . EYE SURGERY    . I&D EXTREMITY Right 04/23/2016   Procedure: IRRIGATION AND DEBRIDEMENT EXTREMITY;  Surgeon: Meredith Pel, MD;  Location: [place];  Service: Orthopedics;  Laterality: Right;  . INCISION AND DRAINAGE HIP Right 04/20/2016   Procedure: IRRIGATION AND DEBRIDEMENT HIP WITH POLY EXCHANGE, ABX BEAD PLACEMENT, WOUND VAC ;  Surgeon: Meredith Pel, MD;  Location: [place];  Service: Orthopedics;  Laterality: Right;  RIGHT HIP SUPERFICIAL I&D, POSSIBLE DEEP I&D, LINER EXCHANGE, ABX BEAD PLACEMENT, WOUND VAC.   Marland Kitchen PR VEIN BYPASS GRAFT,AORTO-FEM-POP  05/25/11  . REMOVAL OF FIBROUS CYST FROM RIGHT BREAST  10+ YEARS  . RETINAL DETACHMENT SURGERY  10+ YEARS   LEFT EYE  . TOTAL HIP ARTHROPLASTY Right 03/06/2016  . TOTAL HIP ARTHROPLASTY Right 03/06/2016   Procedure: RIGHT TOTAL HIP ARTHROPLASTY ANTERIOR APPROACH;  Surgeon: Meredith Pel, MD;  Location: [place];  Service: Orthopedics;  Laterality: Right;  . TRANSTHORACIC ECHOCARDIOGRAM  12/01/2009   EF 60-65%     Social History Social History   Tobacco Use  . Smoking  status: Former Smoker    Years: 20.00    Types: Cigarettes    Last attempt to quit: 07/10/1991    Years since quitting: 26.9  . Smokeless tobacco: Never Used  Substance Use Topics  . Alcohol use: No  . Drug use: No    Family History Family History  Problem Relation Age of Onset  . Hypertension Mother   . Coronary artery disease Father   . Hypertension Sister   . Cirrhosis Brother   . Kidney disease Daughter   . Breast cancer Neg Hx     Allergies  Allergies  Allergen Reactions  . Sulfasalazine     UNSPECIFIED REACTION   . Iohexol Itching, Rash and Other (See Comments)     Code: RASH, Desc: pt called 1 day post scanning stating that skin was red and "itching all over"  some what better but still had symptom.. instructed pt to take benadryl to relieve symptoms,per dr Alvester Chou.if any problems call back/mms, Onset Date: [id number]   . Sulfa Antibiotics Other (See Comments)    UNSPECIFIED REACTION      Current Outpatient Medications  Medication Sig Dispense Refill  . acetaminophen (TYLENOL) 500 MG tablet Take 500 mg by mouth every 6 (six) hours as needed for moderate pain or headache.     . allopurinol (ZYLOPRIM) 100 MG tablet Take 100 mg by mouth daily as needed (for gout).     Marland Kitchen aspirin EC 81 MG tablet Take 81 mg by mouth daily.    . calcitRIOL (ROCALTROL) 0.5 MCG capsule Take 0.5 mcg by mouth daily.    . calcium acetate (PHOSLO) 667 MG capsule TAKE 1 CAPSULE BY MOUTH THREE TIMES DAILY BEFORE MEAL(S)  6  . carvedilol (COREG) 12.5 MG tablet Take 1 tablet (12.5 mg total) by mouth 2 (two) times daily. 180 tablet 3  . Cholecalciferol (VITAMIN D) 2000 units tablet Take by mouth.    . cloNIDine (CATAPRES) 0.3 MG tablet Take 0.3 mg by mouth 2 (two) times daily.     . Coenzyme Q10 300 MG CAPS Take 300 mg by mouth daily.     . colesevelam (WELCHOL) 625 MG tablet Take 1,875 mg by mouth 2 (two) times daily with a meal.     . ferrous sulfate 325 (65 FE) MG tablet Take 325 mg by mouth 2 (two) times daily.     . furosemide (LASIX) 80 MG tablet Take 160 mg by mouth daily.     Marland Kitchen glucose blood (PRECISION QID TEST) test strip by Other route Three (3) times a day.    . hydrALAZINE (APRESOLINE) 50 MG tablet Take 50 mg by mouth 3 (three) times daily.    Marland Kitchen HYDROcodone-acetaminophen (NORCO/VICODIN) 5-325 MG tablet Take 1 tablet by mouth every 6 (six) hours as needed for moderate pain. 6 tablet 0  . insulin glargine (LANTUS) 100 UNIT/ML injection Inject 0.2 mLs (20 Units total) into the skin 2 (two) times daily. (Patient taking differently: Inject 20-24 Units into the skin See admin instructions. Inject 24 units SQ in the morning and inject 20 units SQ in the evening) 10 mL 11  .  isosorbide dinitrate (ISORDIL) 20 MG tablet Take 40 mg by mouth 2 (two) times daily.     Marland Kitchen liraglutide (VICTOZA) 18 MG/3ML SOPN Inject 1.8 mg into the skin daily.     . Multiple Vitamins-Minerals (MULTIVITAMIN PO) Take 1 tablet by mouth daily.    Marland Kitchen omeprazole (PRILOSEC) 40 MG capsule     . Probiotic Product (  TRUNATURE DIGESTIVE PROBIOTIC) CAPS Take by mouth.    . promethazine (PHENERGAN) 12.5 MG tablet     . Sennosides 25 MG TABS Take 25 mg by mouth daily as needed (for constipation).    . vitamin B-12 (CYANOCOBALAMIN) 500 MCG tablet Take by mouth.     No current facility-administered medications for this visit.     ROS:   General:  No weight loss, Fever, chills  HEENT: No recent headaches, no nasal bleeding, no visual changes, no sore throat  Neurologic: No dizziness, blackouts, seizures. No recent symptoms of stroke or mini- stroke. No recent episodes of slurred speech, or temporary blindness.  Cardiac: No recent episodes of chest pain/pressure, no shortness of breath at rest.  Positve shortness of breath with exertion.  Denies history of atrial fibrillation or irregular heartbeat  Vascular: No history of rest pain in feet.  No history of claudication.  No history of non-healing ulcer, No history of DVT   Pulmonary: No home oxygen, no productive cough, no hemoptysis,  No asthma or wheezing  Musculoskeletal:  [ ]  Arthritis, [ ]  Low back pain,  [ ]  Joint pain  Hematologic:No history of hypercoagulable state.  No history of easy bleeding.  No history of anemia  Gastrointestinal: No hematochezia or melena,  No gastroesophageal reflux, no trouble swallowing  Urinary: [x ] chronic Kidney disease, [ ]  on HD - [ ]  MWF or [ ]  TTHS, [ ]  Burning with urination, [ ]  Frequent urination, [ ]  Difficulty urinating;   Skin: No rashes  Psychological: No history of anxiety,  No history of depression   Physical Examination  Vitals:   06/30/18 1323  BP: (!) 152/54  Pulse: (!) 46  Resp: 20   Temp: (!) 97.1 F (36.2 C)  TempSrc: Oral  SpO2: 97%  Weight: 181 lb (82.1 kg)  Height: 5\' 2"  (1.575 m)    Body mass index is 33.11 kg/m.  General:  Alert and oriented, no acute distress HEENT: Normal Neck: No bruit or JVD Pulmonary: Clear to auscultation bilaterally Cardiac: Regular Rate and Rhythm without murmur Gastrointestinal: Soft, non-tender, non-distended, no mass, no scars Skin: No rash Extremity Pulses:  2+ radial, brachial pulses bilaterally, palpable thrill in the distal fistula.  Musculoskeletal: No deformity or edema  Neurologic: Upper and lower extremity motor intact  DATA:  OUTFLOW VEINPSV (cm/s)Diameter (cm)Depth (cm)Describe +------------+----------+-------------+----------+--------+ Prox UA         98        0.64        1.19            +------------+----------+-------------+----------+--------+ Mid UA         165        0.85        1.12            +------------+----------+-------------+----------+--------+ Dist UA        111        1.11        0.46            +------------+----------+-------------+----------+--------+ AC Fossa        82        1.84        0.05            +------------+----------+-------------+----------+--------+   ASSESSMENT:  CKD not currently on HD s/p left Brachial Cephalic AV fistula   PLAN: She will be scheduled for left AV fistula superficialization.  She has agreed with this plan.  Her next f/u appt. With  her Nephrologist is mid to late [place] PA-C [facility] [place] Office: [phone]

## 2018-06-30 NOTE — Telephone Encounter (Signed)
Call to patient OR time change. Instructed to be at [facility] admitting at 9 am on 07/28/18. Patient choice .

## 2018-06-30 NOTE — Progress Notes (Signed)
[facility] [place] HISTORY AND PHYSICAL   History of Present Illness:  Patient is a 72 y.o. year old female who presents for follow up s/p Left Brachial Cephalic AV fistula.  She is no currently on HD.  On her last f/u the duplex revealed proximal and mid fistula are too deep 1.19 cm and 1.12cm.  The diameter is acceptable.  She wished to wait before returning to the OR for further surgery.  She denise pain, loss of motor and numbness in the left UE.  Past Medical History:  Diagnosis Date  . Anxiety   . Carotid artery occlusion   . CKD (chronic kidney disease) stage 3, GFR 30-59 ml/min (HCC) 04/18/2014  . Claudication ([place])   . Constipation    Due to patient taking iron supplements  . Coronary artery disease   . DDD (degenerative disc disease)   . Diabetes mellitus   . Diabetic coma ([place])   . DJD (degenerative joint disease)   . DJD (degenerative joint disease)   . Edema    Bilateral lower extermities  . Gout   . Hepatitis    Hep C  . Hyperlipidemia   . Hypertension   . Iron deficiency anemia   . Leg pain   . Obesity   . PAD (peripheral artery disease) ([place])   . Shortness of breath    exertion    Past Surgical History:  Procedure Laterality Date  . AORTA - BILATERAL FEMORAL ARTERY BYPASS GRAFT  05/25/2011   Procedure: AORTA BIFEMORAL BYPASS GRAFT;  Surgeon: Mal Misty, MD;  Location: [place];  Service: Vascular;  Laterality: N/A;  . APPLICATION OF WOUND VAC Right 04/23/2016   Procedure: APPLICATION OF WOUND VAC CHANGED;  Surgeon: Meredith Pel, MD;  Location: [place];  Service: Orthopedics;  Laterality: Right;  . AV FISTULA PLACEMENT Left 03/03/2018   Procedure: BRACHIOCEPHALIC ARTERIOVENOUS FISTULA CREATION LEFT ARM;  Surgeon: Elam Dutch, MD;  Location: [place];  Service: Vascular;  Laterality: Left;  . BREAST EXCISIONAL BIOPSY Right   . BREAST SURGERY    . CARDIAC CATHETERIZATION  12/01/2009   EF 65%  . CARDIOVASCULAR STRESS TEST   11/28/2009   EF 75%  . COLONOSCOPY W/ POLYPECTOMY    . CORONARY ARTERY BYPASS GRAFT  12/08/2009   LIMA GRAFT TO THE DISTAL LAD AND SAPHENOUS VEIN GRAFT TO THE OBTUSE MARGINAL VESSEL  . EYE SURGERY    . I&D EXTREMITY Right 04/23/2016   Procedure: IRRIGATION AND DEBRIDEMENT EXTREMITY;  Surgeon: Meredith Pel, MD;  Location: [place];  Service: Orthopedics;  Laterality: Right;  . INCISION AND DRAINAGE HIP Right 04/20/2016   Procedure: IRRIGATION AND DEBRIDEMENT HIP WITH POLY EXCHANGE, ABX BEAD PLACEMENT, WOUND VAC ;  Surgeon: Meredith Pel, MD;  Location: [place];  Service: Orthopedics;  Laterality: Right;  RIGHT HIP SUPERFICIAL I&D, POSSIBLE DEEP I&D, LINER EXCHANGE, ABX BEAD PLACEMENT, WOUND VAC.   Marland Kitchen PR VEIN BYPASS GRAFT,AORTO-FEM-POP  05/25/11  . REMOVAL OF FIBROUS CYST FROM RIGHT BREAST  10+ YEARS  . RETINAL DETACHMENT SURGERY  10+ YEARS   LEFT EYE  . TOTAL HIP ARTHROPLASTY Right 03/06/2016  . TOTAL HIP ARTHROPLASTY Right 03/06/2016   Procedure: RIGHT TOTAL HIP ARTHROPLASTY ANTERIOR APPROACH;  Surgeon: Meredith Pel, MD;  Location: [place];  Service: Orthopedics;  Laterality: Right;  . TRANSTHORACIC ECHOCARDIOGRAM  12/01/2009   EF 60-65%     Social History Social History   Tobacco Use  . Smoking  status: Former Smoker    Years: 20.00    Types: Cigarettes    Last attempt to quit: 07/10/1991    Years since quitting: 26.9  . Smokeless tobacco: Never Used  Substance Use Topics  . Alcohol use: No  . Drug use: No    Family History Family History  Problem Relation Age of Onset  . Hypertension Mother   . Coronary artery disease Father   . Hypertension Sister   . Cirrhosis Brother   . Kidney disease Daughter   . Breast cancer Neg Hx     Allergies  Allergies  Allergen Reactions  . Sulfasalazine     UNSPECIFIED REACTION   . Iohexol Itching, Rash and Other (See Comments)     Code: RASH, Desc: pt called 1 day post scanning stating that skin was red and "itching all over"  some what better but still had symptom.. instructed pt to take benadryl to relieve symptoms,per dr Alvester Chou.if any problems call back/mms, Onset Date: [id number]   . Sulfa Antibiotics Other (See Comments)    UNSPECIFIED REACTION      Current Outpatient Medications  Medication Sig Dispense Refill  . acetaminophen (TYLENOL) 500 MG tablet Take 500 mg by mouth every 6 (six) hours as needed for moderate pain or headache.     . allopurinol (ZYLOPRIM) 100 MG tablet Take 100 mg by mouth daily as needed (for gout).     Marland Kitchen aspirin EC 81 MG tablet Take 81 mg by mouth daily.    . calcitRIOL (ROCALTROL) 0.5 MCG capsule Take 0.5 mcg by mouth daily.    . calcium acetate (PHOSLO) 667 MG capsule TAKE 1 CAPSULE BY MOUTH THREE TIMES DAILY BEFORE MEAL(S)  6  . carvedilol (COREG) 12.5 MG tablet Take 1 tablet (12.5 mg total) by mouth 2 (two) times daily. 180 tablet 3  . Cholecalciferol (VITAMIN D) 2000 units tablet Take by mouth.    . cloNIDine (CATAPRES) 0.3 MG tablet Take 0.3 mg by mouth 2 (two) times daily.     . Coenzyme Q10 300 MG CAPS Take 300 mg by mouth daily.     . colesevelam (WELCHOL) 625 MG tablet Take 1,875 mg by mouth 2 (two) times daily with a meal.     . ferrous sulfate 325 (65 FE) MG tablet Take 325 mg by mouth 2 (two) times daily.     . furosemide (LASIX) 80 MG tablet Take 160 mg by mouth daily.     Marland Kitchen glucose blood (PRECISION QID TEST) test strip by Other route Three (3) times a day.    . hydrALAZINE (APRESOLINE) 50 MG tablet Take 50 mg by mouth 3 (three) times daily.    Marland Kitchen HYDROcodone-acetaminophen (NORCO/VICODIN) 5-325 MG tablet Take 1 tablet by mouth every 6 (six) hours as needed for moderate pain. 6 tablet 0  . insulin glargine (LANTUS) 100 UNIT/ML injection Inject 0.2 mLs (20 Units total) into the skin 2 (two) times daily. (Patient taking differently: Inject 20-24 Units into the skin See admin instructions. Inject 24 units SQ in the morning and inject 20 units SQ in the evening) 10 mL 11  .  isosorbide dinitrate (ISORDIL) 20 MG tablet Take 40 mg by mouth 2 (two) times daily.     Marland Kitchen liraglutide (VICTOZA) 18 MG/3ML SOPN Inject 1.8 mg into the skin daily.     . Multiple Vitamins-Minerals (MULTIVITAMIN PO) Take 1 tablet by mouth daily.    Marland Kitchen omeprazole (PRILOSEC) 40 MG capsule     . Probiotic Product (  TRUNATURE DIGESTIVE PROBIOTIC) CAPS Take by mouth.    . promethazine (PHENERGAN) 12.5 MG tablet     . Sennosides 25 MG TABS Take 25 mg by mouth daily as needed (for constipation).    . vitamin B-12 (CYANOCOBALAMIN) 500 MCG tablet Take by mouth.     No current facility-administered medications for this visit.     ROS:   General:  No weight loss, Fever, chills  HEENT: No recent headaches, no nasal bleeding, no visual changes, no sore throat  Neurologic: No dizziness, blackouts, seizures. No recent symptoms of stroke or mini- stroke. No recent episodes of slurred speech, or temporary blindness.  Cardiac: No recent episodes of chest pain/pressure, no shortness of breath at rest.  Positve shortness of breath with exertion.  Denies history of atrial fibrillation or irregular heartbeat  Vascular: No history of rest pain in feet.  No history of claudication.  No history of non-healing ulcer, No history of DVT   Pulmonary: No home oxygen, no productive cough, no hemoptysis,  No asthma or wheezing  Musculoskeletal:  [ ]  Arthritis, [ ]  Low back pain,  [ ]  Joint pain  Hematologic:No history of hypercoagulable state.  No history of easy bleeding.  No history of anemia  Gastrointestinal: No hematochezia or melena,  No gastroesophageal reflux, no trouble swallowing  Urinary: [x ] chronic Kidney disease, [ ]  on HD - [ ]  MWF or [ ]  TTHS, [ ]  Burning with urination, [ ]  Frequent urination, [ ]  Difficulty urinating;   Skin: No rashes  Psychological: No history of anxiety,  No history of depression   Physical Examination  Vitals:   06/30/18 1323  BP: (!) 152/54  Pulse: (!) 46  Resp: 20   Temp: (!) 97.1 F (36.2 C)  TempSrc: Oral  SpO2: 97%  Weight: 181 lb (82.1 kg)  Height: 5\' 2"  (1.575 m)    Body mass index is 33.11 kg/m.  General:  Alert and oriented, no acute distress HEENT: Normal Neck: No bruit or JVD Pulmonary: Clear to auscultation bilaterally Cardiac: Regular Rate and Rhythm without murmur Gastrointestinal: Soft, non-tender, non-distended, no mass, no scars Skin: No rash Extremity Pulses:  2+ radial, brachial pulses bilaterally, palpable thrill in the distal fistula.  Musculoskeletal: No deformity or edema  Neurologic: Upper and lower extremity motor intact  DATA:  OUTFLOW VEINPSV (cm/s)Diameter (cm)Depth (cm)Describe +------------+----------+-------------+----------+--------+ Prox UA         98        0.64        1.19            +------------+----------+-------------+----------+--------+ Mid UA         165        0.85        1.12            +------------+----------+-------------+----------+--------+ Dist UA        111        1.11        0.46            +------------+----------+-------------+----------+--------+ AC Fossa        82        1.84        0.05            +------------+----------+-------------+----------+--------+   ASSESSMENT:  CKD not currently on HD s/p left Brachial Cephalic AV fistula   PLAN: She will be scheduled for left AV fistula superficialization.  She has agreed with this plan.  Her next f/u appt. With  her Nephrologist is mid to late [place] PA-C [facility] [place] Office: [phone]

## 2018-07-16 ENCOUNTER — Other Ambulatory Visit: Payer: Self-pay | Admitting: *Deleted

## 2018-07-16 ENCOUNTER — Telehealth: Payer: Self-pay | Admitting: Internal Medicine

## 2018-07-16 DIAGNOSIS — D508 Other iron deficiency anemias: Secondary | ICD-10-CM

## 2018-07-16 NOTE — Telephone Encounter (Signed)
[place] patient per 1/8 sch message - per patient does not want to come in until end of the month 1/30

## 2018-07-17 ENCOUNTER — Inpatient Hospital Stay: Payer: Medicare Other

## 2018-07-25 ENCOUNTER — Encounter (HOSPITAL_COMMUNITY): Payer: Self-pay | Admitting: *Deleted

## 2018-07-25 ENCOUNTER — Other Ambulatory Visit: Payer: Self-pay

## 2018-07-25 NOTE — Progress Notes (Signed)
Spoke with pt for pre-op call. Pt has hx of CAD with CABG in 2011. Dr. Martinique is her cardiologist, last visit was 09/05/17 with instructions to follow up in a year. Pt denies any recent chest pain or sob. Pt is a type 2 diabetic. Last A1C was 6.3 on 03/03/18. Pt states her fasting blood sugar is usually between 130-140. Instructed pt to take 1/2 of her regular dose of Lantus Sunday evening (she will take 10 units) and 1/2 of her regular dose of Lantus on Monday AM (will take 12 units). Instructed pt to check her blood sugar when she gets up and every 2 hours until she leaves for the hospital. If blood sugar is 70 or below, treat with 1/2 cup of clear juice (apple or cranberry) and recheck blood sugar 15 minutes after drinking juice. If blood sugar continues to be 70 or below, call the Short Stay department and ask to speak to a nurse. Pt voiced understanding.

## 2018-07-28 ENCOUNTER — Encounter (HOSPITAL_COMMUNITY)
Admission: RE | Disposition: A | Discharge status: Home / Self Care | Payer: Self-pay | Source: Home / Self Care | Attending: Vascular Surgery

## 2018-07-28 ENCOUNTER — Ambulatory Visit (HOSPITAL_COMMUNITY)
Admission: RE | Admit: 2018-07-28 | Discharge: 2018-07-28 | Disposition: A | Discharge status: Home / Self Care | Payer: Medicare Other | Attending: Vascular Surgery | Admitting: Vascular Surgery

## 2018-07-28 ENCOUNTER — Ambulatory Visit (HOSPITAL_COMMUNITY): Payer: Medicare Other | Admitting: Anesthesiology

## 2018-07-28 ENCOUNTER — Encounter (HOSPITAL_COMMUNITY): Payer: Self-pay

## 2018-07-28 ENCOUNTER — Other Ambulatory Visit: Payer: Self-pay

## 2018-07-28 DIAGNOSIS — B1920 Unspecified viral hepatitis C without hepatic coma: Secondary | ICD-10-CM | POA: Diagnosis not present

## 2018-07-28 DIAGNOSIS — Z6832 Body mass index (BMI) 32.0-32.9, adult: Secondary | ICD-10-CM | POA: Insufficient documentation

## 2018-07-28 DIAGNOSIS — I2510 Atherosclerotic heart disease of native coronary artery without angina pectoris: Secondary | ICD-10-CM | POA: Diagnosis not present

## 2018-07-28 DIAGNOSIS — Z79899 Other long term (current) drug therapy: Secondary | ICD-10-CM | POA: Insufficient documentation

## 2018-07-28 DIAGNOSIS — Z888 Allergy status to other drugs, medicaments and biological substances status: Secondary | ICD-10-CM | POA: Insufficient documentation

## 2018-07-28 DIAGNOSIS — E785 Hyperlipidemia, unspecified: Secondary | ICD-10-CM | POA: Diagnosis not present

## 2018-07-28 DIAGNOSIS — K5900 Constipation, unspecified: Secondary | ICD-10-CM | POA: Insufficient documentation

## 2018-07-28 DIAGNOSIS — T82898A Other specified complication of vascular prosthetic devices, implants and grafts, initial encounter: Secondary | ICD-10-CM | POA: Insufficient documentation

## 2018-07-28 DIAGNOSIS — Z7982 Long term (current) use of aspirin: Secondary | ICD-10-CM | POA: Insufficient documentation

## 2018-07-28 DIAGNOSIS — Z951 Presence of aortocoronary bypass graft: Secondary | ICD-10-CM | POA: Diagnosis not present

## 2018-07-28 DIAGNOSIS — M1990 Unspecified osteoarthritis, unspecified site: Secondary | ICD-10-CM | POA: Insufficient documentation

## 2018-07-28 DIAGNOSIS — E669 Obesity, unspecified: Secondary | ICD-10-CM | POA: Diagnosis not present

## 2018-07-28 DIAGNOSIS — E1122 Type 2 diabetes mellitus with diabetic chronic kidney disease: Secondary | ICD-10-CM | POA: Diagnosis not present

## 2018-07-28 DIAGNOSIS — N183 Chronic kidney disease, stage 3 (moderate): Secondary | ICD-10-CM | POA: Diagnosis not present

## 2018-07-28 DIAGNOSIS — Z87891 Personal history of nicotine dependence: Secondary | ICD-10-CM | POA: Diagnosis not present

## 2018-07-28 DIAGNOSIS — N186 End stage renal disease: Secondary | ICD-10-CM

## 2018-07-28 DIAGNOSIS — M109 Gout, unspecified: Secondary | ICD-10-CM | POA: Insufficient documentation

## 2018-07-28 DIAGNOSIS — Z794 Long term (current) use of insulin: Secondary | ICD-10-CM | POA: Insufficient documentation

## 2018-07-28 DIAGNOSIS — E1151 Type 2 diabetes mellitus with diabetic peripheral angiopathy without gangrene: Secondary | ICD-10-CM | POA: Diagnosis not present

## 2018-07-28 DIAGNOSIS — Y832 Surgical operation with anastomosis, bypass or graft as the cause of abnormal reaction of the patient, or of later complication, without mention of misadventure at the time of the procedure: Secondary | ICD-10-CM | POA: Diagnosis not present

## 2018-07-28 DIAGNOSIS — Z882 Allergy status to sulfonamides status: Secondary | ICD-10-CM | POA: Diagnosis not present

## 2018-07-28 DIAGNOSIS — I129 Hypertensive chronic kidney disease with stage 1 through stage 4 chronic kidney disease, or unspecified chronic kidney disease: Secondary | ICD-10-CM | POA: Insufficient documentation

## 2018-07-28 HISTORY — PX: FISTULA SUPERFICIALIZATION: SHX6341

## 2018-07-28 LAB — POCT I-STAT 4, (NA,K, GLUC, HGB,HCT)
Glucose, Bld: 94 mg/dL (ref 70–99)
HCT: 28 % — ABNORMAL LOW (ref 36.0–46.0)
Hemoglobin: 9.5 g/dL — ABNORMAL LOW (ref 12.0–15.0)
Potassium: 4.3 mmol/L (ref 3.5–5.1)
Sodium: 144 mmol/L (ref 135–145)

## 2018-07-28 LAB — GLUCOSE, CAPILLARY
GLUCOSE-CAPILLARY: 115 mg/dL — AB (ref 70–99)
Glucose-Capillary: 97 mg/dL (ref 70–99)
Glucose-Capillary: 76 mg/dL (ref 70–99)

## 2018-07-28 SURGERY — FISTULA SUPERFICIALIZATION
Anesthesia: General | Site: Arm Upper | Laterality: Left

## 2018-07-28 MED ORDER — PROTAMINE SULFATE 10 MG/ML IV SOLN
INTRAVENOUS | Status: AC
  Filled 2018-07-28: qty 5

## 2018-07-28 MED ORDER — PROPOFOL 10 MG/ML IV BOLUS
INTRAVENOUS | Status: DC | PRN
  Administered 2018-07-28: 200 mg via INTRAVENOUS

## 2018-07-28 MED ORDER — LIDOCAINE-EPINEPHRINE (PF) 1 %-1:200000 IJ SOLN
INTRAMUSCULAR | Status: AC
  Filled 2018-07-28: qty 30

## 2018-07-28 MED ORDER — SODIUM CHLORIDE 0.9 % IV SOLN
INTRAVENOUS | Status: DC
  Administered 2018-07-28: 09:00:00 via INTRAVENOUS

## 2018-07-28 MED ORDER — CHLORHEXIDINE GLUCONATE 4 % EX LIQD: 60.0000 mL | Freq: Once | CUTANEOUS | Status: DC

## 2018-07-28 MED ORDER — HYDROCODONE-ACETAMINOPHEN 5-325 MG PO TABS: 1.0000 | ORAL_TABLET | Freq: Four times a day (QID) | ORAL | 0 refills | Status: DC | PRN

## 2018-07-28 MED ORDER — HEPARIN SODIUM (PORCINE) 1000 UNIT/ML IJ SOLN
INTRAMUSCULAR | Status: AC
  Filled 2018-07-28: qty 1

## 2018-07-28 MED ORDER — CEFAZOLIN SODIUM-DEXTROSE 2-4 GM/100ML-% IV SOLN
2.0000 g | INTRAVENOUS | Status: AC
  Administered 2018-07-28: 2 g via INTRAVENOUS

## 2018-07-28 MED ORDER — 0.9 % SODIUM CHLORIDE (POUR BTL) OPTIME
TOPICAL | Status: DC | PRN
  Administered 2018-07-28: 1000 mL

## 2018-07-28 MED ORDER — FENTANYL CITRATE (PF) 250 MCG/5ML IJ SOLN
INTRAMUSCULAR | Status: AC
  Filled 2018-07-28: qty 5

## 2018-07-28 MED ORDER — FENTANYL CITRATE (PF) 100 MCG/2ML IJ SOLN
INTRAMUSCULAR | Status: DC | PRN
  Administered 2018-07-28: 25 ug via INTRAVENOUS

## 2018-07-28 MED ORDER — SODIUM CHLORIDE 0.9 % IV SOLN
INTRAVENOUS | Status: DC | PRN
  Administered 2018-07-28: 13:00:00

## 2018-07-28 MED ORDER — DEXAMETHASONE SODIUM PHOSPHATE 10 MG/ML IJ SOLN
INTRAMUSCULAR | Status: DC | PRN
  Administered 2018-07-28: 5 mg via INTRAVENOUS

## 2018-07-28 MED ORDER — PROPOFOL 10 MG/ML IV BOLUS
INTRAVENOUS | Status: AC
  Filled 2018-07-28: qty 20

## 2018-07-28 MED ORDER — ONDANSETRON HCL 4 MG/2ML IJ SOLN
INTRAMUSCULAR | Status: DC | PRN
  Administered 2018-07-28: 4 mg via INTRAVENOUS

## 2018-07-28 MED ORDER — SODIUM CHLORIDE 0.9 % IV SOLN
INTRAVENOUS | Status: AC
  Filled 2018-07-28: qty 1.2

## 2018-07-28 MED ORDER — LIDOCAINE 2% (20 MG/ML) 5 ML SYRINGE
INTRAMUSCULAR | Status: DC | PRN
  Administered 2018-07-28: 60 mg via INTRAVENOUS

## 2018-07-28 MED ORDER — LIDOCAINE HCL (PF) 1 % IJ SOLN
INTRAMUSCULAR | Status: AC
  Filled 2018-07-28: qty 30

## 2018-07-28 MED ORDER — CEFAZOLIN SODIUM-DEXTROSE 2-4 GM/100ML-% IV SOLN
INTRAVENOUS | Status: AC
  Filled 2018-07-28: qty 100

## 2018-07-28 SURGICAL SUPPLY — 36 items
ARMBAND PINK RESTRICT EXTREMIT (MISCELLANEOUS) ×2 IMPLANT
CANISTER SUCT 3000ML PPV (MISCELLANEOUS) ×2 IMPLANT
CLIP VESOCCLUDE MED 6/CT (CLIP) ×2 IMPLANT
CLIP VESOCCLUDE SM WIDE 6/CT (CLIP) ×3 IMPLANT
COVER PROBE W GEL 5X96 (DRAPES) IMPLANT
COVER SURGICAL LIGHT HANDLE (MISCELLANEOUS) ×1 IMPLANT
COVER WAND RF STERILE (DRAPES) ×2 IMPLANT
DERMABOND ADVANCED (GAUZE/BANDAGES/DRESSINGS) ×1
DERMABOND ADVANCED .7 DNX12 (GAUZE/BANDAGES/DRESSINGS) ×1 IMPLANT
DRAIN PENROSE 1/4X12 LTX STRL (WOUND CARE) IMPLANT
ELECT REM PT RETURN 9FT ADLT (ELECTROSURGICAL) ×2
ELECTRODE REM PT RTRN 9FT ADLT (ELECTROSURGICAL) ×1 IMPLANT
GLOVE BIO SURGEON STRL SZ 6.5 (GLOVE) ×1 IMPLANT
GLOVE BIO SURGEON STRL SZ7.5 (GLOVE) ×2 IMPLANT
GLOVE BIOGEL PI IND STRL 6.5 (GLOVE) IMPLANT
GLOVE BIOGEL PI IND STRL 7.0 (GLOVE) IMPLANT
GLOVE BIOGEL PI INDICATOR 6.5 (GLOVE) ×1
GLOVE BIOGEL PI INDICATOR 7.0 (GLOVE) ×1
GLOVE ECLIPSE 6.5 STRL STRAW (GLOVE) ×1 IMPLANT
GLOVE SURG SS PI 6.5 STRL IVOR (GLOVE) ×2 IMPLANT
GOWN STRL REUS W/ TWL LRG LVL3 (GOWN DISPOSABLE) ×3 IMPLANT
GOWN STRL REUS W/TWL LRG LVL3 (GOWN DISPOSABLE) ×3
HEMOSTAT SPONGE AVITENE ULTRA (HEMOSTASIS) IMPLANT
KIT BASIN OR (CUSTOM PROCEDURE TRAY) ×2 IMPLANT
KIT TURNOVER KIT B (KITS) ×2 IMPLANT
LOOP VESSEL MINI RED (MISCELLANEOUS) IMPLANT
NS IRRIG 1000ML POUR BTL (IV SOLUTION) ×2 IMPLANT
PACK CV ACCESS (CUSTOM PROCEDURE TRAY) ×2 IMPLANT
PAD ARMBOARD 7.5X6 YLW CONV (MISCELLANEOUS) ×4 IMPLANT
SUT PROLENE 7 0 BV 1 (SUTURE) ×3 IMPLANT
SUT VIC AB 3-0 SH 27 (SUTURE) ×3
SUT VIC AB 3-0 SH 27X BRD (SUTURE) ×1 IMPLANT
SUT VICRYL 4-0 PS2 18IN ABS (SUTURE) ×4 IMPLANT
TOWEL GREEN STERILE (TOWEL DISPOSABLE) ×2 IMPLANT
UNDERPAD 30X30 (UNDERPADS AND DIAPERS) ×2 IMPLANT
WATER STERILE IRR 1000ML POUR (IV SOLUTION) ×2 IMPLANT

## 2018-07-28 NOTE — Op Note (Signed)
Procedure: Left Brachial Cephalic AV Fistula side branch ligation and superficialization PreOp: Poorly functioning AVF PostOp: same Anesthesia: General Asst. Luanne Bras RNFA Findings: 3 large side branches ligated, diameter of main fistula 6-8 mm  Operative details: After obtaining informed consent, the patient was taken to the operating room. The patient was placed in supine position on the operating table. After commencing general anesthesia the left upper extremity was prepped and draped in the usual sterile fashion.   A longitudinal incision was made over the proximal aspect of the fistula just above the antecubital area. The incision was carried into the subcutaneous tissues down to level of the pre-existing AV fistula. The fistula was patent and did have a thrill within it. The fistula was dissected free circumferentially in each incision and all side branches identified and ligated and divided between silk ties.  The main fistula was 6-8 mm diameter.  After all side branches were ligated the subcutaneous tissues under each skin bridge were debrided of subcutaneous fat to superficialize the fistula.    The skin of each incision was closed with a 4  0 Vicryl subcuticular stitch followed by dermabond.   The patient tolerated the procedure well and there were no complications. Instrument sponge and needle counts were correct at the end of the case. The patient was taken to the recovery room in stable condition.  Ruta Hinds, MD [facility] Office: [phone] Pager: [phone]

## 2018-07-28 NOTE — Anesthesia Preprocedure Evaluation (Signed)
Anesthesia Evaluation  Patient identified by MRN, date of birth, ID band Patient awake    Reviewed: Allergy & Precautions, NPO status , Patient's Chart, lab work & pertinent test results, reviewed documented beta blocker date and time   Airway Mallampati: II  TM Distance: >3 FB Neck ROM: Full    Dental  (+) Dental Advisory Given   Pulmonary former smoker,    breath sounds clear to auscultation       Cardiovascular hypertension, Pt. on medications and Pt. on home beta blockers + CAD and + Peripheral Vascular Disease   Rhythm:Regular Rate:Normal     Neuro/Psych negative neurological ROS     GI/Hepatic negative GI ROS, (+) Hepatitis -, C  Endo/Other  diabetes, Type 2, Insulin Dependent  Renal/GU ESRFRenal disease     Musculoskeletal   Abdominal   Peds  Hematology  (+) anemia ,   Anesthesia Other Findings   Reproductive/Obstetrics                             Lab Results  Component Value Date   WBC 5.1 06/27/2018   HGB 9.5 (L) 07/28/2018   HCT 28.0 (L) 07/28/2018   MCV 92.6 06/27/2018   PLT 224 06/27/2018   Lab Results  Component Value Date   CREATININE 6.56 (HH) 06/27/2018   BUN 86 (H) 06/27/2018   NA 144 07/28/2018   K 4.3 07/28/2018   CL 109 06/27/2018   CO2 21 (L) 06/27/2018    Anesthesia Physical Anesthesia Plan  ASA: III  Anesthesia Plan: General   Post-op Pain Management:    Induction: Intravenous  PONV Risk Score and Plan: 3 and Dexamethasone, Treatment may vary due to age or medical condition and Ondansetron  Airway Management Planned: LMA  Additional Equipment:   Intra-op Plan:   Post-operative Plan: Extubation in OR  Informed Consent: I have reviewed the patients History and Physical, chart, labs and discussed the procedure including the risks, benefits and alternatives for the proposed anesthesia with the patient or authorized representative who has  indicated his/her understanding and acceptance.     Dental advisory given  Plan Discussed with: CRNA  Anesthesia Plan Comments:         Anesthesia Quick Evaluation

## 2018-07-28 NOTE — Interval H&P Note (Signed)
History and Physical Interval Note:  07/28/2018 11:43 AM  Norma [name]  has presented today for surgery, with the diagnosis of COMPLICATION WITH FISTULA -LEFT ARM  The various methods of treatment have been discussed with the patient and family. After consideration of risks, benefits and other options for treatment, the patient has consented to  Procedure(s): FISTULA SUPERFICIALIZATION (Left) as a surgical intervention .  The patient's history has been reviewed, patient examined, no change in status, stable for surgery.  I have reviewed the patient's chart and labs.  Questions were answered to the patient's satisfaction.     Ruta Hinds

## 2018-07-28 NOTE — Transfer of Care (Signed)
Immediate Anesthesia Transfer of Care Note  Patient: Norma [name]  Procedure(s) Performed: LEFT ARM FISTULA SUPERFICIALIZATION (Left Arm Upper)  Patient Location: PACU  Anesthesia Type:General  Level of Consciousness: awake, alert  and oriented  Airway & Oxygen Therapy: Patient Spontanous Breathing and Patient connected to nasal cannula oxygen  Post-op Assessment: Report given to RN, Post -op Vital signs reviewed and stable and Patient moving all extremities X 4  Post vital signs: Reviewed and stable  Last Vitals:  Vitals Value Taken Time  BP 156/52 07/28/2018  2:26 PM  Temp    Pulse 47 07/28/2018  2:29 PM  Resp 18 07/28/2018  2:29 PM  SpO2 100 % 07/28/2018  2:29 PM  Vitals shown include unvalidated device data.  Last Pain:  Vitals:   07/28/18 0905  TempSrc:   PainSc: 0-No pain      Patients Stated Pain Goal: 2 (31/28/11 8867)  Complications: No apparent anesthesia complications

## 2018-07-28 NOTE — Anesthesia Procedure Notes (Addendum)
Procedure Name: LMA Insertion Date/Time: 07/28/2018 12:09 PM Performed by: Neldon Newport, CRNA Pre-anesthesia Checklist: Patient identified, Emergency Drugs available, Suction available and Patient being monitored Patient Re-evaluated:Patient Re-evaluated prior to induction Oxygen Delivery Method: Circle System Utilized Preoxygenation: Pre-oxygenation with 100% oxygen Induction Type: IV induction Ventilation: Mask ventilation without difficulty LMA: LMA inserted LMA Size: 4.0 Number of attempts: 1 Airway Equipment and Method: Bite block Placement Confirmation: positive ETCO2 Tube secured with: Tape Dental Injury: Teeth and Oropharynx as per pre-operative assessment

## 2018-07-28 NOTE — Anesthesia Postprocedure Evaluation (Signed)
Anesthesia Post Note  Patient: Norma [name]  Procedure(s) Performed: LEFT ARM FISTULA SUPERFICIALIZATION (Left Arm Upper)     Patient location during evaluation: PACU Anesthesia Type: General Level of consciousness: awake and alert Pain management: pain level controlled Vital Signs Assessment: post-procedure vital signs reviewed and stable Respiratory status: spontaneous breathing, nonlabored ventilation, respiratory function stable and patient connected to nasal cannula oxygen Cardiovascular status: blood pressure returned to baseline and stable Postop Assessment: no apparent nausea or vomiting Anesthetic complications: no    Last Vitals:  Vitals:   07/28/18 1500 07/28/18 1526  BP: (!) 153/50 (!) 161/53  Pulse: (!) 47 (!) 48  Resp: (!) 23   Temp:    SpO2: 91% 92%    Last Pain:  Vitals:   07/28/18 1526  TempSrc:   PainSc: 0-No pain                 Tiajuana Amass

## 2018-07-29 ENCOUNTER — Encounter (HOSPITAL_COMMUNITY): Payer: Self-pay | Admitting: Vascular Surgery

## 2018-07-29 ENCOUNTER — Telehealth: Payer: Self-pay | Admitting: Vascular Surgery

## 2018-07-29 NOTE — Telephone Encounter (Signed)
sch appt phone NA mld ltr 08/14/2018 3pm wound check MD

## 2018-07-29 NOTE — Telephone Encounter (Signed)
-----   Message from Mena Goes, RN sent at 07/28/2018  1:51 PM EST ----- Regarding: 2 weeks for wound check  ----- Message ----- From: Elam Dutch, MD Sent: 07/28/2018   1:35 PM EST To: [facility] Charge Pool  Left arm fistula superficialization with side branch ligation Nurse asst  Pt needs follow up appt with me in 2 weeks to check incisions  Ruta Hinds

## 2018-08-07 ENCOUNTER — Inpatient Hospital Stay: Payer: Medicare Other | Attending: Internal Medicine

## 2018-08-07 ENCOUNTER — Inpatient Hospital Stay: Payer: Medicare Other

## 2018-08-07 VITALS — BP 145/53 | HR 48

## 2018-08-07 DIAGNOSIS — N189 Chronic kidney disease, unspecified: Secondary | ICD-10-CM | POA: Diagnosis present

## 2018-08-07 DIAGNOSIS — N184 Chronic kidney disease, stage 4 (severe): Secondary | ICD-10-CM

## 2018-08-07 DIAGNOSIS — D638 Anemia in other chronic diseases classified elsewhere: Secondary | ICD-10-CM

## 2018-08-07 DIAGNOSIS — D631 Anemia in chronic kidney disease: Secondary | ICD-10-CM | POA: Insufficient documentation

## 2018-08-07 DIAGNOSIS — D508 Other iron deficiency anemias: Secondary | ICD-10-CM

## 2018-08-07 LAB — CBC WITH DIFFERENTIAL (CANCER CENTER ONLY)
Abs Immature Granulocytes: 0.04 10*3/uL (ref 0.00–0.07)
Basophils Absolute: 0 10*3/uL (ref 0.0–0.1)
Basophils Relative: 0 %
Eosinophils Absolute: 0.1 10*3/uL (ref 0.0–0.5)
Eosinophils Relative: 3 %
HEMATOCRIT: 26.2 % — AB (ref 36.0–46.0)
HEMOGLOBIN: 7.6 g/dL — AB (ref 12.0–15.0)
Immature Granulocytes: 1 %
LYMPHS PCT: 12 %
Lymphs Abs: 0.5 10*3/uL — ABNORMAL LOW (ref 0.7–4.0)
MCH: 28.1 pg (ref 26.0–34.0)
MCHC: 29 g/dL — ABNORMAL LOW (ref 30.0–36.0)
MCV: 97 fL (ref 80.0–100.0)
Monocytes Absolute: 0.4 10*3/uL (ref 0.1–1.0)
Monocytes Relative: 10 %
Neutro Abs: 2.8 10*3/uL (ref 1.7–7.7)
Neutrophils Relative %: 74 %
Platelet Count: 200 10*3/uL (ref 150–400)
RBC: 2.7 MIL/uL — ABNORMAL LOW (ref 3.87–5.11)
RDW: 18.6 % — ABNORMAL HIGH (ref 11.5–15.5)
WBC Count: 3.8 10*3/uL — ABNORMAL LOW (ref 4.0–10.5)
nRBC: 0 % (ref 0.0–0.2)

## 2018-08-07 LAB — CMP (CANCER CENTER ONLY)
AST: 7 U/L — ABNORMAL LOW (ref 15–41)
Albumin: 2.7 g/dL — ABNORMAL LOW (ref 3.5–5.0)
Alkaline Phosphatase: 70 U/L (ref 38–126)
Anion gap: 12 (ref 5–15)
BUN: 72 mg/dL — ABNORMAL HIGH (ref 8–23)
CO2: 22 mmol/L (ref 22–32)
Calcium: 8.2 mg/dL — ABNORMAL LOW (ref 8.9–10.3)
Chloride: 111 mmol/L (ref 98–111)
Creatinine: 5.21 mg/dL (ref 0.44–1.00)
GFR, Est AFR Am: 9 mL/min — ABNORMAL LOW (ref >60)
GFR, Estimated: 8 mL/min — ABNORMAL LOW (ref >60)
Glucose, Bld: 122 mg/dL — ABNORMAL HIGH (ref 70–99)
Potassium: 4.4 mmol/L (ref 3.5–5.1)
Sodium: 145 mmol/L (ref 135–145)
Total Bilirubin: 0.5 mg/dL (ref 0.3–1.2)
Total Protein: 5.6 g/dL — ABNORMAL LOW (ref 6.5–8.1)

## 2018-08-07 MED ORDER — DARBEPOETIN ALFA 300 MCG/0.6ML IJ SOSY
PREFILLED_SYRINGE | INTRAMUSCULAR | Status: AC
  Filled 2018-08-07: qty 0.6

## 2018-08-07 MED ORDER — DARBEPOETIN ALFA 300 MCG/0.6ML IJ SOSY
300.0000 ug | PREFILLED_SYRINGE | Freq: Once | INTRAMUSCULAR | Status: AC
  Administered 2018-08-07: 300 ug via SUBCUTANEOUS

## 2018-08-08 ENCOUNTER — Telehealth: Payer: Self-pay | Admitting: *Deleted

## 2018-08-08 ENCOUNTER — Other Ambulatory Visit: Payer: Self-pay | Admitting: *Deleted

## 2018-08-08 DIAGNOSIS — D638 Anemia in other chronic diseases classified elsewhere: Secondary | ICD-10-CM

## 2018-08-08 NOTE — Telephone Encounter (Signed)
TCT patient regarding appts for blood transfusion etc.. Informed patient that she can have her blood transfusion on Wednesday, 08/13/18 @ 8am at the [facility] [place] on [facility]. Pt will also need to have type and cross done prior to that. Pt states she can come in on Monday, 08/11/18 for lab appt.  She states her niece will be taking her to both appts.  Orders entered.

## 2018-08-08 NOTE — Telephone Encounter (Signed)
TCT patient regarding need for blood transfusion. Pt's HGB was 7.6 yesterday.  Spoke with patient and made her aware of this.. She needs to talk with her niece for transportation.  She will call back and let me know . Pt is only symptomatic when she walks any distance, she states she gets worn out.  Pt will need type and cross as well. For 2 units of blood.

## 2018-08-11 ENCOUNTER — Other Ambulatory Visit: Payer: Self-pay | Admitting: *Deleted

## 2018-08-11 ENCOUNTER — Inpatient Hospital Stay: Payer: Medicare Other | Attending: Internal Medicine

## 2018-08-11 DIAGNOSIS — I2510 Atherosclerotic heart disease of native coronary artery without angina pectoris: Secondary | ICD-10-CM | POA: Insufficient documentation

## 2018-08-11 DIAGNOSIS — M109 Gout, unspecified: Secondary | ICD-10-CM | POA: Diagnosis not present

## 2018-08-11 DIAGNOSIS — M1990 Unspecified osteoarthritis, unspecified site: Secondary | ICD-10-CM | POA: Diagnosis not present

## 2018-08-11 DIAGNOSIS — B1920 Unspecified viral hepatitis C without hepatic coma: Secondary | ICD-10-CM | POA: Insufficient documentation

## 2018-08-11 DIAGNOSIS — D638 Anemia in other chronic diseases classified elsewhere: Secondary | ICD-10-CM

## 2018-08-11 DIAGNOSIS — Z79899 Other long term (current) drug therapy: Secondary | ICD-10-CM | POA: Insufficient documentation

## 2018-08-11 DIAGNOSIS — I129 Hypertensive chronic kidney disease with stage 1 through stage 4 chronic kidney disease, or unspecified chronic kidney disease: Secondary | ICD-10-CM | POA: Diagnosis not present

## 2018-08-11 DIAGNOSIS — E669 Obesity, unspecified: Secondary | ICD-10-CM | POA: Insufficient documentation

## 2018-08-11 DIAGNOSIS — N183 Chronic kidney disease, stage 3 (moderate): Secondary | ICD-10-CM | POA: Insufficient documentation

## 2018-08-11 DIAGNOSIS — Z951 Presence of aortocoronary bypass graft: Secondary | ICD-10-CM | POA: Insufficient documentation

## 2018-08-11 DIAGNOSIS — F419 Anxiety disorder, unspecified: Secondary | ICD-10-CM | POA: Diagnosis not present

## 2018-08-11 DIAGNOSIS — Z794 Long term (current) use of insulin: Secondary | ICD-10-CM | POA: Diagnosis not present

## 2018-08-11 DIAGNOSIS — E1122 Type 2 diabetes mellitus with diabetic chronic kidney disease: Secondary | ICD-10-CM | POA: Insufficient documentation

## 2018-08-11 DIAGNOSIS — D631 Anemia in chronic kidney disease: Secondary | ICD-10-CM | POA: Insufficient documentation

## 2018-08-11 DIAGNOSIS — E785 Hyperlipidemia, unspecified: Secondary | ICD-10-CM | POA: Diagnosis not present

## 2018-08-11 DIAGNOSIS — Z7982 Long term (current) use of aspirin: Secondary | ICD-10-CM | POA: Insufficient documentation

## 2018-08-11 LAB — PREPARE RBC (CROSSMATCH)

## 2018-08-11 LAB — SAMPLE TO BLOOD BANK

## 2018-08-12 LAB — ABO/RH: ABO/RH(D): A POS

## 2018-08-13 ENCOUNTER — Telehealth: Payer: Self-pay | Admitting: Medical Oncology

## 2018-08-13 ENCOUNTER — Ambulatory Visit (HOSPITAL_COMMUNITY)
Admission: RE | Admit: 2018-08-13 | Discharge: 2018-08-13 | Disposition: A | Discharge status: Home / Self Care | Payer: Medicare Other | Source: Ambulatory Visit | Attending: Internal Medicine | Admitting: Internal Medicine

## 2018-08-13 DIAGNOSIS — N184 Chronic kidney disease, stage 4 (severe): Secondary | ICD-10-CM | POA: Insufficient documentation

## 2018-08-13 DIAGNOSIS — D638 Anemia in other chronic diseases classified elsewhere: Secondary | ICD-10-CM | POA: Insufficient documentation

## 2018-08-13 MED ORDER — DIPHENHYDRAMINE HCL 25 MG PO CAPS
25.0000 mg | ORAL_CAPSULE | Freq: Once | ORAL | Status: AC
  Administered 2018-08-13: 25 mg via ORAL
  Filled 2018-08-13: qty 1

## 2018-08-13 MED ORDER — SODIUM CHLORIDE 0.9% IV SOLUTION
250.0000 mL | Freq: Once | INTRAVENOUS | Status: AC
  Administered 2018-08-13: 250 mL via INTRAVENOUS

## 2018-08-13 MED ORDER — ACETAMINOPHEN 325 MG PO TABS
650.0000 mg | ORAL_TABLET | Freq: Once | ORAL | Status: AC
  Administered 2018-08-13: 650 mg via ORAL
  Filled 2018-08-13: qty 2

## 2018-08-13 NOTE — Progress Notes (Signed)
                   Patient Care Center Note   Diagnosis: Anemia    Provider: Dr. Mckinley Jewel     Procedure: 2 units of PRBC    Note: Norma [name] came to the Patient [place] to receive 2 units of PRBC via PIV. Patient blood pressure was elevated post first unit. Patient took her own home clonidine and was  asymptomatic. MD office made aware. Second unit of blood administered at a slower rate. Patient received total of 2 units as ordered. Upon completion of second unit, pt blood pressure remained elevated, asymptomatic. Patient stated she was going home to take her lasix.   Discharge instructions reviewed and understanding verbalized. Patient alert, and orientated at time of discharge. Patient discharge via wheelchair with family member.   Otho Bellows, RN

## 2018-08-13 NOTE — Discharge Instructions (Signed)
Blood Transfusion, Adult °A blood transfusion is a procedure in which you are given blood through an IV tube. You may need this procedure because of: °· Illness. °· Surgery. °· Injury. °The blood may come from someone else (a donor). You may also be able to donate blood for yourself (autologous blood donation). The blood given in a transfusion is made up of different types of cells. You may get: °· Red blood cells. These carry oxygen to the cells in the body. °· White blood cells. These help you fight infections. °· Platelets. These help your blood to clot. °· Plasma. This is the liquid part of your blood. It helps with fluid imbalances. °If you have a clotting disorder, you may also get other types of blood products. °What happens before the procedure? °· You will have a blood test to find out your blood type. The test also finds out what type of blood your body will accept and matches it to the donor type. °· If you are going to have a planned surgery, you may be able to donate your own blood. This may be done in case you need a transfusion. °· If you have had an allergic reaction to a transfusion in the past, you may be given medicine to help prevent a reaction. This medicine may be given to you by mouth or through an IV. °· You will have your temperature, blood pressure, and pulse checked. °· Follow instructions from your doctor about what you cannot eat or drink. °· Ask your doctor about: °? Changing or stopping your regular medicines. This is important if you take diabetes medicines or blood thinners. °? Taking medicines such as aspirin and ibuprofen. These medicines can thin your blood. Do not take these medicines before your procedure if your doctor tells you not to. °What happens during the procedure? °· An IV tube will be put into one of your veins. °· The bag of donated blood will be attached to your IV tube. Then, the blood will enter through your vein. °· Your temperature, blood pressure, and pulse will  be checked regularly during the procedure. This is done to find early signs of a transfusion reaction. °· If you have any signs or symptoms of a reaction, your transfusion will be stopped. You may also be given medicine. °· When the transfusion is done, your IV tube will be taken out. °· Pressure may be applied to the IV site for a few minutes. °· A bandage (dressing) will be put on the IV site. °The procedure may vary among doctors and hospitals. °What happens after the procedure? °· Your temperature, blood pressure, heart rate, breathing rate, and blood oxygen level will be checked often. °· Your blood may be tested to see how you are responding to the transfusion. °· You may be warmed with fluids or blankets. This is done to keep the temperature of your body normal. °Summary °· A blood transfusion is a procedure in which you are given blood through an IV tube. °· The blood may come from someone else (a donor). You may also be able to donate blood for yourself. °· If you have had an allergic reaction to a transfusion in the past, you may be given medicine to help prevent a reaction. This medicine may be given to you by mouth or through an IV tube. °· Your temperature, blood pressure, heart rate, breathing rate, and blood oxygen level will be checked often. °· Your blood may be tested to see   how you are responding to the transfusion. This information is not intended to replace advice given to you by your health care provider. Make sure you discuss any questions you have with your health care provider. Document Released: 09/21/2008 Document Revised: 02/17/2016 Document Reviewed: 02/17/2016 Elsevier Interactive Patient Education  2019 [employer].   Anemia  Anemia is a condition in which you do not have enough red blood cells or hemoglobin. Hemoglobin is a substance in red blood cells that carries oxygen. When you do not have enough red blood cells or hemoglobin (are anemic), your body cannot get enough  oxygen and your organs may not work properly. As a result, you may feel very tired or have other problems. What are the causes? Common causes of anemia include:  Excessive bleeding. Anemia can be caused by excessive bleeding inside or outside the body, including bleeding from the intestine or from periods in women.  Poor nutrition.  Long-lasting (chronic) kidney, thyroid, and liver disease.  Bone marrow disorders.  Cancer and treatments for cancer.  HIV (human immunodeficiency virus) and AIDS (acquired immunodeficiency syndrome).  Treatments for HIV and AIDS.  Spleen problems.  Blood disorders.  Infections, medicines, and autoimmune disorders that destroy red blood cells. What are the signs or symptoms? Symptoms of this condition include:  Minor weakness.  Dizziness.  Headache.  Feeling heartbeats that are irregular or faster than normal (palpitations).  Shortness of breath, especially with exercise.  Paleness.  Cold sensitivity.  Indigestion.  Nausea.  Difficulty sleeping.  Difficulty concentrating. Symptoms may occur suddenly or develop slowly. If your anemia is mild, you may not have symptoms. How is this diagnosed? This condition is diagnosed based on:  Blood tests.  Your medical history.  A physical exam.  Bone marrow biopsy. Your health care provider may also check your stool (feces) for blood and may do additional testing to look for the cause of your bleeding. You may also have other tests, including:  Imaging tests, such as a CT scan or MRI.  Endoscopy.  Colonoscopy. How is this treated? Treatment for this condition depends on the cause. If you continue to lose a lot of blood, you may need to be treated at a hospital. Treatment may include:  Taking supplements of iron, vitamin B93, or folic acid.  Taking a hormone medicine (erythropoietin) that can help to stimulate red blood cell growth.  Having a blood transfusion. This may be needed  if you lose a lot of blood.  Making changes to your diet.  Having surgery to remove your spleen. Follow these instructions at home:  Take over-the-counter and prescription medicines only as told by your health care provider.  Take supplements only as told by your health care provider.  Follow any diet instructions that you were given.  Keep all follow-up visits as told by your health care provider. This is important. Contact a health care provider if:  You develop new bleeding anywhere in the body. Get help right away if:  You are very weak.  You are short of breath.  You have pain in your abdomen or chest.  You are dizzy or feel faint.  You have trouble concentrating.  You have bloody or black, tarry stools.  You vomit repeatedly or you vomit up blood. Summary  Anemia is a condition in which you do not have enough red blood cells or enough of a substance in your red blood cells that carries oxygen (hemoglobin).  Symptoms may occur suddenly or develop slowly.  If your anemia is mild, you may not have symptoms.  This condition is diagnosed with blood tests as well as a medical history and physical exam. Other tests may be needed.  Treatment for this condition depends on the cause of the anemia. This information is not intended to replace advice given to you by your health care provider. Make sure you discuss any questions you have with your health care provider. Document Released: 08/02/2004 Document Revised: 07/27/2016 Document Reviewed: 07/27/2016 Elsevier Interactive Patient Education  2019 [employer].

## 2018-08-13 NOTE — Telephone Encounter (Signed)
Norma [name] called to report bp 172/54, 46  post 1 st unit blood. Pt took her own clonidine . No other symptoms.

## 2018-08-14 ENCOUNTER — Ambulatory Visit: Payer: Medicare Other | Admitting: Vascular Surgery

## 2018-08-14 ENCOUNTER — Other Ambulatory Visit: Payer: Self-pay

## 2018-08-14 ENCOUNTER — Encounter: Payer: Self-pay | Admitting: Vascular Surgery

## 2018-08-14 VITALS — BP 185/67 | HR 51 | Temp 97.4°F | Resp 20 | Ht 62.0 in | Wt 178.0 lb

## 2018-08-14 DIAGNOSIS — N184 Chronic kidney disease, stage 4 (severe): Secondary | ICD-10-CM

## 2018-08-14 LAB — TYPE AND SCREEN
ABO/RH(D): A POS
Antibody Screen: NEGATIVE
UNIT DIVISION: 0
Unit division: 0

## 2018-08-14 LAB — BPAM RBC
Blood Product Expiration Date: 202002242359
Blood Product Expiration Date: 202002282359
ISSUE DATE / TIME: 202002050842
ISSUE DATE / TIME: 202002050842
Unit Type and Rh: 6200
Unit Type and Rh: 6200

## 2018-08-14 NOTE — Progress Notes (Signed)
Patient is a 73 year old female who returns for postoperative follow-up today after recent sidebranch ligation July 28, 2018 and Superficialization of her left brachiocephalic AV fistula.  She reports no numbness or tingling in her hand.  She has no incisional drainage.  Physical exam:  Vitals:   08/14/18 1447  BP: (!) 185/67  Pulse: (!) 51  Resp: 20  Temp: (!) 97.4 F (36.3 C)  SpO2: 93%  Weight: 178 lb (80.7 kg)  Height: 5\' 2"  (1.575 m)   Left upper extremity healing upper arm incisions left hand is pink and warm palpable thrill audible bruit in fistula  Assessment: Doing well status post Superficialization and ligation of side branches left upper arm AV fistula.  Plan: The patient will follow-up with me on an as-needed basis.  The fistula should be ready for use in about 1 month if required.  Ruta Hinds, MD [facility] Office: [phone] Pager: [phone]

## 2018-08-27 ENCOUNTER — Other Ambulatory Visit: Payer: Self-pay | Admitting: Medical Oncology

## 2018-08-27 DIAGNOSIS — D638 Anemia in other chronic diseases classified elsewhere: Secondary | ICD-10-CM

## 2018-08-28 ENCOUNTER — Inpatient Hospital Stay: Payer: Medicare Other

## 2018-08-28 ENCOUNTER — Telehealth: Payer: Self-pay | Admitting: Internal Medicine

## 2018-08-28 ENCOUNTER — Inpatient Hospital Stay (HOSPITAL_BASED_OUTPATIENT_CLINIC_OR_DEPARTMENT_OTHER): Payer: Medicare Other | Admitting: Internal Medicine

## 2018-08-28 ENCOUNTER — Encounter: Payer: Self-pay | Admitting: Internal Medicine

## 2018-08-28 VITALS — BP 160/60 | HR 51 | Temp 97.9°F | Resp 18 | Ht 62.0 in | Wt 181.9 lb

## 2018-08-28 DIAGNOSIS — Z79899 Other long term (current) drug therapy: Secondary | ICD-10-CM | POA: Diagnosis not present

## 2018-08-28 DIAGNOSIS — N183 Chronic kidney disease, stage 3 (moderate): Secondary | ICD-10-CM | POA: Diagnosis not present

## 2018-08-28 DIAGNOSIS — D631 Anemia in chronic kidney disease: Secondary | ICD-10-CM | POA: Diagnosis not present

## 2018-08-28 DIAGNOSIS — D638 Anemia in other chronic diseases classified elsewhere: Secondary | ICD-10-CM

## 2018-08-28 DIAGNOSIS — I10 Essential (primary) hypertension: Secondary | ICD-10-CM

## 2018-08-28 DIAGNOSIS — I129 Hypertensive chronic kidney disease with stage 1 through stage 4 chronic kidney disease, or unspecified chronic kidney disease: Secondary | ICD-10-CM

## 2018-08-28 LAB — CBC WITH DIFFERENTIAL (CANCER CENTER ONLY)
Abs Immature Granulocytes: 0.01 10*3/uL (ref 0.00–0.07)
Basophils Absolute: 0 10*3/uL (ref 0.0–0.1)
Basophils Relative: 0 %
EOS ABS: 0.1 10*3/uL (ref 0.0–0.5)
Eosinophils Relative: 1 %
HCT: 36.5 % (ref 36.0–46.0)
Hemoglobin: 10.7 g/dL — ABNORMAL LOW (ref 12.0–15.0)
Immature Granulocytes: 0 %
Lymphocytes Relative: 10 %
Lymphs Abs: 0.4 10*3/uL — ABNORMAL LOW (ref 0.7–4.0)
MCH: 28 pg (ref 26.0–34.0)
MCHC: 29.3 g/dL — ABNORMAL LOW (ref 30.0–36.0)
MCV: 95.5 fL (ref 80.0–100.0)
MONO ABS: 0.4 10*3/uL (ref 0.1–1.0)
Monocytes Relative: 9 %
Neutro Abs: 3.3 10*3/uL (ref 1.7–7.7)
Neutrophils Relative %: 80 %
Platelet Count: 145 10*3/uL — ABNORMAL LOW (ref 150–400)
RBC: 3.82 MIL/uL — ABNORMAL LOW (ref 3.87–5.11)
RDW: 19.9 % — AB (ref 11.5–15.5)
WBC Count: 4.2 10*3/uL (ref 4.0–10.5)
nRBC: 0 % (ref 0.0–0.2)

## 2018-08-28 LAB — SAMPLE TO BLOOD BANK

## 2018-08-28 LAB — CMP (CANCER CENTER ONLY)
ALT: <6 U/L (ref 0–44)
AST: 6 U/L — ABNORMAL LOW (ref 15–41)
Albumin: 3.1 g/dL — ABNORMAL LOW (ref 3.5–5.0)
Alkaline Phosphatase: 61 U/L (ref 38–126)
Anion gap: 15 (ref 5–15)
BUN: 65 mg/dL — AB (ref 8–23)
CALCIUM: 8.6 mg/dL — AB (ref 8.9–10.3)
CO2: 24 mmol/L (ref 22–32)
Chloride: 106 mmol/L (ref 98–111)
Creatinine: 5.52 mg/dL (ref 0.44–1.00)
GFR, Est AFR Am: 8 mL/min — ABNORMAL LOW (ref >60)
GFR, Estimated: 7 mL/min — ABNORMAL LOW (ref >60)
Glucose, Bld: 192 mg/dL — ABNORMAL HIGH (ref 70–99)
Potassium: 4.3 mmol/L (ref 3.5–5.1)
Sodium: 145 mmol/L (ref 135–145)
Total Bilirubin: 1.2 mg/dL (ref 0.3–1.2)
Total Protein: 5.9 g/dL — ABNORMAL LOW (ref 6.5–8.1)

## 2018-08-28 LAB — IRON AND TIBC
Iron: 40 ug/dL — ABNORMAL LOW (ref 41–142)
Saturation Ratios: 20 % — ABNORMAL LOW (ref 21–57)
TIBC: 200 ug/dL — ABNORMAL LOW (ref 236–444)
UIBC: 160 ug/dL (ref 120–384)

## 2018-08-28 LAB — FERRITIN: Ferritin: 256 ng/mL (ref 11–307)

## 2018-08-28 NOTE — Progress Notes (Signed)
Received call from lab with pt creatinine of 5.6

## 2018-08-28 NOTE — Progress Notes (Signed)
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  PRIOR THERAPY: None  CURRENT THERAPY: Aranesp 300 g subcutaneously every 3 weeks. First dose 03/06/2017. Over-the-counter ferrous sulfate.  INTERVAL HISTORY: Norma [name] 73 y.o. female returns to the clinic today for follow-up visit.  The patient is feeling fine today with no concerning complaints except for mild fatigue.  She received 2 units of PRBCs transfusion 3 weeks ago because of her anemia and drop in her hemoglobin less than 8.0G/DL.  The patient continues on the Aranesp injection every 3 weeks and she is tolerating it fairly well.  She also take oral iron tablets at regular basis.  She denied having any current chest pain, shortness of breath, cough or hemoptysis.  She denied having any fever or chills.  She has no nausea, vomiting, diarrhea or constipation.  She is here today for evaluation and repeat blood work.  MEDICAL HISTORY: Past Medical History:  Diagnosis Date  . Anxiety   . Carotid artery occlusion   . CKD (chronic kidney disease) stage 3, GFR 30-59 ml/min (HCC) 04/18/2014  . Claudication ([place])   . Constipation    Due to patient taking iron supplements  . Coronary artery disease   . DDD (degenerative disc disease)   . Diabetes mellitus   . Diabetic coma ([place])   . DJD (degenerative joint disease)   . DJD (degenerative joint disease)   . Edema    Bilateral lower extermities  . Gout   . Hepatitis    Hep C, has been treated with "[place]" - cured per pt  . Hyperlipidemia   . Hypertension   . Iron deficiency anemia   . Leg pain   . Obesity   . PAD (peripheral artery disease) ([place])   . Shortness of breath    exertion    ALLERGIES:  is allergic to sulfasalazine; iohexol; and sulfa  antibiotics.  MEDICATIONS:  Current Outpatient Medications  Medication Sig Dispense Refill  . acetaminophen (TYLENOL) 500 MG tablet Take 500 mg by mouth every 6 (six) hours as needed for moderate pain or headache.     . allopurinol (ZYLOPRIM) 100 MG tablet Take 100 mg by mouth daily as needed (for gout).     Marland Kitchen aspirin EC 81 MG tablet Take 81 mg by mouth daily.    . calcitRIOL (ROCALTROL) 0.5 MCG capsule Take 0.5 mcg by mouth daily.    . calcium acetate (PHOSLO) 667 MG capsule Take 667 mg by mouth 3 (three) times daily before meals.   6  . carvedilol (COREG) 12.5 MG tablet Take 1 tablet (12.5 mg total) by mouth 2 (two) times daily. 180 tablet 3  . Cholecalciferol (VITAMIN D) 2000 units tablet Take 2,000 Units by mouth 2 (two) times a week.     . cloNIDine (CATAPRES) 0.3 MG tablet Take 0.3 mg by mouth 2 (two) times daily.     . colesevelam (WELCHOL) 625 MG tablet Take 1,875 mg by mouth 2 (two) times daily with a meal.     . Cyanocobalamin (VITAMIN B-12) 5000 MCG TBDP Take 5,000 mcg by mouth daily.     . ferrous sulfate 325 (65 FE) MG tablet Take 325 mg by mouth 2 (two) times daily.     . furosemide (LASIX) 80 MG tablet Take 160  mg by mouth daily.     Marland Kitchen glucose blood (PRECISION QID TEST) test strip by Other route Three (3) times a day.    . hydrALAZINE (APRESOLINE) 50 MG tablet Take 50 mg by mouth 3 (three) times daily.    Marland Kitchen HYDROcodone-acetaminophen (NORCO) 5-325 MG tablet Take 1 tablet by mouth every 6 (six) hours as needed for moderate pain or severe pain. 10 tablet 0  . HYDROcodone-acetaminophen (NORCO/VICODIN) 5-325 MG tablet Take 1 tablet by mouth every 6 (six) hours as needed for moderate pain. (Patient taking differently: Take 1 tablet by mouth every 6 (six) hours as needed for severe pain. ) 6 tablet 0  . insulin glargine (LANTUS) 100 UNIT/ML injection Inject 0.2 mLs (20 Units total) into the skin 2 (two) times daily. (Patient taking differently: Inject 20-24 Units into the skin See admin  instructions. Inject 24 units SQ in the morning and inject 20 units SQ in the evening) 10 mL 11  . isosorbide dinitrate (ISORDIL) 20 MG tablet Take 20 mg by mouth 2 (two) times daily.     . Multiple Vitamins-Minerals (MULTIVITAMIN PO) Take 1 tablet by mouth 2 (two) times a week.     Marland Kitchen omeprazole (PRILOSEC) 40 MG capsule Take 40 mg by mouth at bedtime.     . promethazine (PHENERGAN) 12.5 MG tablet Take 12.5 mg by mouth every 8 (eight) hours as needed for nausea or vomiting.     . Sennosides 25 MG TABS Take 25 mg by mouth every 3 (three) days.    . sodium bicarbonate 650 MG tablet Take 650 mg by mouth 2 (two) times daily.     No current facility-administered medications for this visit.     SURGICAL HISTORY:  Past Surgical History:  Procedure Laterality Date  . AORTA - BILATERAL FEMORAL ARTERY BYPASS GRAFT  05/25/2011   Procedure: AORTA BIFEMORAL BYPASS GRAFT;  Surgeon: Mal Misty, MD;  Location: [facility];  Service: Vascular;  Laterality: N/A;  . APPLICATION OF WOUND VAC Right 04/23/2016   Procedure: APPLICATION OF WOUND VAC CHANGED;  Surgeon: Meredith Pel, MD;  Location: [facility];  Service: Orthopedics;  Laterality: Right;  . AV FISTULA PLACEMENT Left 03/03/2018   Procedure: BRACHIOCEPHALIC ARTERIOVENOUS FISTULA CREATION LEFT ARM;  Surgeon: Elam Dutch, MD;  Location: [facility];  Service: Vascular;  Laterality: Left;  . BREAST EXCISIONAL BIOPSY Right   . BREAST SURGERY    . CARDIAC CATHETERIZATION  12/01/2009   EF 65%  . CARDIOVASCULAR STRESS TEST  11/28/2009   EF 75%  . COLONOSCOPY W/ POLYPECTOMY    . CORONARY ARTERY BYPASS GRAFT  12/08/2009   LIMA GRAFT TO THE DISTAL LAD AND SAPHENOUS VEIN GRAFT TO THE OBTUSE MARGINAL VESSEL  . EYE SURGERY    . FISTULA SUPERFICIALIZATION Left 07/28/2018   Procedure: LEFT ARM FISTULA SUPERFICIALIZATION;  Surgeon: Elam Dutch, MD;  Location: [facility];  Service: Vascular;  Laterality: Left;  . I&D EXTREMITY Right 04/23/2016   Procedure: IRRIGATION  AND DEBRIDEMENT EXTREMITY;  Surgeon: Meredith Pel, MD;  Location: [facility];  Service: Orthopedics;  Laterality: Right;  . INCISION AND DRAINAGE HIP Right 04/20/2016   Procedure: IRRIGATION AND DEBRIDEMENT HIP WITH POLY EXCHANGE, ABX BEAD PLACEMENT, WOUND VAC ;  Surgeon: Meredith Pel, MD;  Location: [facility];  Service: Orthopedics;  Laterality: Right;  RIGHT HIP SUPERFICIAL I&D, POSSIBLE DEEP I&D, LINER EXCHANGE, ABX BEAD PLACEMENT, WOUND VAC.   Marland Kitchen PR VEIN BYPASS GRAFT,AORTO-FEM-POP  05/25/11  . REMOVAL  OF FIBROUS CYST FROM RIGHT BREAST  10+ YEARS  . RETINAL DETACHMENT SURGERY  10+ YEARS   LEFT EYE  . TOTAL HIP ARTHROPLASTY Right 03/06/2016  . TOTAL HIP ARTHROPLASTY Right 03/06/2016   Procedure: RIGHT TOTAL HIP ARTHROPLASTY ANTERIOR APPROACH;  Surgeon: Meredith Pel, MD;  Location: [facility];  Service: Orthopedics;  Laterality: Right;  . TRANSTHORACIC ECHOCARDIOGRAM  12/01/2009   EF 60-65%    REVIEW OF SYSTEMS:  A comprehensive review of systems was negative except for: Constitutional: positive for fatigue   PHYSICAL EXAMINATION: General appearance: alert, cooperative, fatigued and no distress Head: Normocephalic, without obvious abnormality, atraumatic Neck: no adenopathy, no JVD, supple, symmetrical, trachea midline and thyroid not enlarged, symmetric, no tenderness/mass/nodules Lymph nodes: Cervical, supraclavicular, and axillary nodes normal. Resp: clear to auscultation bilaterally Back: symmetric, no curvature. ROM normal. No CVA tenderness. Cardio: regular rate and rhythm, S1, S2 normal, no murmur, click, rub or gallop GI: soft, non-tender; bowel sounds normal; no masses,  no organomegaly Extremities: extremities normal, atraumatic, no cyanosis or edema  ECOG PERFORMANCE STATUS: 1 - Symptomatic but completely ambulatory  Blood pressure (!) 160/60, pulse (!) 51, temperature 97.9 F (36.6 C), temperature source Oral, resp. rate 18, height 5\' 2"  (1.575 m), weight 181 lb 14.4 oz  (82.5 kg), SpO2 96 %.  LABORATORY DATA: Lab Results  Component Value Date   WBC 4.2 08/28/2018   HGB 10.7 (L) 08/28/2018   HCT 36.5 08/28/2018   MCV 95.5 08/28/2018   PLT 145 (L) 08/28/2018      Chemistry      Component Value Date/Time   NA 145 08/07/2018 1127   NA 143 06/28/2017 1239   K 4.4 08/07/2018 1127   K 4.8 06/28/2017 1239   CL 111 08/07/2018 1127   CO2 22 08/07/2018 1127   CO2 22 06/28/2017 1239   BUN 72 (H) 08/07/2018 1127   BUN 48.6 (H) 06/28/2017 1239   CREATININE 5.21 (HH) 08/07/2018 1127   CREATININE 2.9 (H) 06/28/2017 1239      Component Value Date/Time   CALCIUM 8.2 (L) 08/07/2018 1127   CALCIUM 8.3 (L) 06/28/2017 1239   ALKPHOS 70 08/07/2018 1127   ALKPHOS 86 06/28/2017 1239   AST 7 (L) 08/07/2018 1127   AST 11 06/28/2017 1239   ALT <6 08/07/2018 1127   ALT <6 06/28/2017 1239   BILITOT 0.5 08/07/2018 1127   BILITOT 0.28 06/28/2017 1239       RADIOGRAPHIC STUDIES: No results found.  ASSESSMENT AND PLAN:  This is a very pleasant 73 years old African-American female with anemia of chronic disease secondary to renal insufficiency plus/minus iron deficiency. The patient is currently on treatment with Aranesp 300 mcg every 3 weeks in addition to over-the-counter oral iron tablets.  The patient continues to tolerate this treatment well. Repeat CBC today showed improvement of her hemoglobin and hematocrit but this is after she received 2 units of PRBCs transfusion 3 weeks ago. I recommended for her to continue her current treatment with Aranesp and iron tablets. I will see her back for follow-up visit in 3 months for evaluation with repeat blood work. For the hypertension, the patient was advised to take her blood pressure medication as prescribed and to consult with her primary care physician for adjustment of her medication. She was advised to call immediately if she has any concerning symptoms in the interval. The patient voices understanding of  current disease status and treatment options and is in agreement with the current care  plan. All questions were answered. The patient knows to call the clinic with any problems, questions or concerns. We can certainly see the patient much sooner if necessary.  Disclaimer: This note was dictated with voice recognition software. Similar sounding words can inadvertently be transcribed and may not be corrected upon review.

## 2018-08-28 NOTE — Telephone Encounter (Signed)
Scheduled appt per 2/20 los. ° °Printed calendar and avs. °

## 2018-09-17 ENCOUNTER — Other Ambulatory Visit: Payer: Self-pay | Admitting: Medical Oncology

## 2018-09-17 DIAGNOSIS — D72829 Elevated white blood cell count, unspecified: Secondary | ICD-10-CM

## 2018-09-18 ENCOUNTER — Inpatient Hospital Stay: Payer: Medicare Other | Attending: Internal Medicine

## 2018-09-18 ENCOUNTER — Other Ambulatory Visit: Payer: Self-pay

## 2018-09-18 ENCOUNTER — Inpatient Hospital Stay: Payer: Medicare Other

## 2018-09-18 DIAGNOSIS — N183 Chronic kidney disease, stage 3 (moderate): Secondary | ICD-10-CM | POA: Insufficient documentation

## 2018-09-18 DIAGNOSIS — N184 Chronic kidney disease, stage 4 (severe): Secondary | ICD-10-CM

## 2018-09-18 DIAGNOSIS — M1990 Unspecified osteoarthritis, unspecified site: Secondary | ICD-10-CM | POA: Diagnosis not present

## 2018-09-18 DIAGNOSIS — Z794 Long term (current) use of insulin: Secondary | ICD-10-CM | POA: Diagnosis not present

## 2018-09-18 DIAGNOSIS — Z951 Presence of aortocoronary bypass graft: Secondary | ICD-10-CM | POA: Insufficient documentation

## 2018-09-18 DIAGNOSIS — E669 Obesity, unspecified: Secondary | ICD-10-CM | POA: Insufficient documentation

## 2018-09-18 DIAGNOSIS — Z79899 Other long term (current) drug therapy: Secondary | ICD-10-CM | POA: Insufficient documentation

## 2018-09-18 DIAGNOSIS — M109 Gout, unspecified: Secondary | ICD-10-CM | POA: Diagnosis not present

## 2018-09-18 DIAGNOSIS — E1122 Type 2 diabetes mellitus with diabetic chronic kidney disease: Secondary | ICD-10-CM | POA: Insufficient documentation

## 2018-09-18 DIAGNOSIS — E785 Hyperlipidemia, unspecified: Secondary | ICD-10-CM | POA: Insufficient documentation

## 2018-09-18 DIAGNOSIS — Z7982 Long term (current) use of aspirin: Secondary | ICD-10-CM | POA: Insufficient documentation

## 2018-09-18 DIAGNOSIS — B1920 Unspecified viral hepatitis C without hepatic coma: Secondary | ICD-10-CM | POA: Diagnosis not present

## 2018-09-18 DIAGNOSIS — F419 Anxiety disorder, unspecified: Secondary | ICD-10-CM | POA: Diagnosis not present

## 2018-09-18 DIAGNOSIS — I2510 Atherosclerotic heart disease of native coronary artery without angina pectoris: Secondary | ICD-10-CM | POA: Diagnosis not present

## 2018-09-18 DIAGNOSIS — D631 Anemia in chronic kidney disease: Secondary | ICD-10-CM | POA: Insufficient documentation

## 2018-09-18 DIAGNOSIS — D638 Anemia in other chronic diseases classified elsewhere: Secondary | ICD-10-CM

## 2018-09-18 DIAGNOSIS — I129 Hypertensive chronic kidney disease with stage 1 through stage 4 chronic kidney disease, or unspecified chronic kidney disease: Secondary | ICD-10-CM | POA: Diagnosis present

## 2018-09-18 DIAGNOSIS — D72829 Elevated white blood cell count, unspecified: Secondary | ICD-10-CM

## 2018-09-18 LAB — CBC WITH DIFFERENTIAL (CANCER CENTER ONLY)
Abs Immature Granulocytes: 0.01 10*3/uL (ref 0.00–0.07)
Basophils Absolute: 0 10*3/uL (ref 0.0–0.1)
Basophils Relative: 0 %
Eosinophils Absolute: 0.1 10*3/uL (ref 0.0–0.5)
Eosinophils Relative: 1 %
HEMATOCRIT: 33 % — AB (ref 36.0–46.0)
Hemoglobin: 9.8 g/dL — ABNORMAL LOW (ref 12.0–15.0)
Immature Granulocytes: 0 %
LYMPHS PCT: 9 %
Lymphs Abs: 0.5 10*3/uL — ABNORMAL LOW (ref 0.7–4.0)
MCH: 28.4 pg (ref 26.0–34.0)
MCHC: 29.7 g/dL — ABNORMAL LOW (ref 30.0–36.0)
MCV: 95.7 fL (ref 80.0–100.0)
MONOS PCT: 9 %
Monocytes Absolute: 0.5 10*3/uL (ref 0.1–1.0)
Neutro Abs: 4.5 10*3/uL (ref 1.7–7.7)
Neutrophils Relative %: 81 %
Platelet Count: 179 10*3/uL (ref 150–400)
RBC: 3.45 MIL/uL — ABNORMAL LOW (ref 3.87–5.11)
RDW: 18 % — AB (ref 11.5–15.5)
WBC Count: 5.7 10*3/uL (ref 4.0–10.5)
nRBC: 0 % (ref 0.0–0.2)

## 2018-09-18 LAB — CMP (CANCER CENTER ONLY)
ALT: <6 U/L (ref 0–44)
AST: 8 U/L — AB (ref 15–41)
Albumin: 3.3 g/dL — ABNORMAL LOW (ref 3.5–5.0)
Alkaline Phosphatase: 64 U/L (ref 38–126)
Anion gap: 16 — ABNORMAL HIGH (ref 5–15)
BUN: 69 mg/dL — ABNORMAL HIGH (ref 8–23)
CO2: 24 mmol/L (ref 22–32)
Calcium: 9.3 mg/dL (ref 8.9–10.3)
Chloride: 107 mmol/L (ref 98–111)
Creatinine: 6.42 mg/dL (ref 0.44–1.00)
GFR, Est AFR Am: 7 mL/min — ABNORMAL LOW (ref >60)
GFR, Estimated: 6 mL/min — ABNORMAL LOW (ref >60)
Glucose, Bld: 73 mg/dL (ref 70–99)
Potassium: 3.4 mmol/L — ABNORMAL LOW (ref 3.5–5.1)
Sodium: 147 mmol/L — ABNORMAL HIGH (ref 135–145)
Total Bilirubin: 1.2 mg/dL (ref 0.3–1.2)
Total Protein: 6.3 g/dL — ABNORMAL LOW (ref 6.5–8.1)

## 2018-09-18 MED ORDER — DARBEPOETIN ALFA 300 MCG/0.6ML IJ SOSY
PREFILLED_SYRINGE | INTRAMUSCULAR | Status: AC
  Filled 2018-09-18: qty 0.6

## 2018-09-18 MED ORDER — DARBEPOETIN ALFA 300 MCG/0.6ML IJ SOSY
300.0000 ug | PREFILLED_SYRINGE | Freq: Once | INTRAMUSCULAR | Status: AC
  Administered 2018-09-18: 300 ug via SUBCUTANEOUS

## 2018-09-18 NOTE — Patient Instructions (Signed)

## 2018-09-19 ENCOUNTER — Other Ambulatory Visit: Payer: Self-pay | Admitting: Family Medicine

## 2018-09-19 DIAGNOSIS — Z1231 Encounter for screening mammogram for malignant neoplasm of breast: Secondary | ICD-10-CM

## 2018-10-08 ENCOUNTER — Other Ambulatory Visit: Payer: Self-pay | Admitting: Medical Oncology

## 2018-10-08 DIAGNOSIS — D72829 Elevated white blood cell count, unspecified: Secondary | ICD-10-CM

## 2018-10-09 ENCOUNTER — Inpatient Hospital Stay: Payer: Medicare Other

## 2018-10-09 ENCOUNTER — Telehealth: Payer: Self-pay | Admitting: *Deleted

## 2018-10-09 ENCOUNTER — Other Ambulatory Visit: Payer: Self-pay

## 2018-10-09 ENCOUNTER — Inpatient Hospital Stay: Payer: Medicare Other | Attending: Internal Medicine

## 2018-10-09 VITALS — BP 152/49 | HR 54 | Resp 18

## 2018-10-09 DIAGNOSIS — Z79899 Other long term (current) drug therapy: Secondary | ICD-10-CM | POA: Insufficient documentation

## 2018-10-09 DIAGNOSIS — D631 Anemia in chronic kidney disease: Secondary | ICD-10-CM | POA: Diagnosis not present

## 2018-10-09 DIAGNOSIS — Z7982 Long term (current) use of aspirin: Secondary | ICD-10-CM | POA: Diagnosis not present

## 2018-10-09 DIAGNOSIS — E1122 Type 2 diabetes mellitus with diabetic chronic kidney disease: Secondary | ICD-10-CM | POA: Diagnosis not present

## 2018-10-09 DIAGNOSIS — N184 Chronic kidney disease, stage 4 (severe): Secondary | ICD-10-CM

## 2018-10-09 DIAGNOSIS — B1920 Unspecified viral hepatitis C without hepatic coma: Secondary | ICD-10-CM | POA: Diagnosis not present

## 2018-10-09 DIAGNOSIS — I2510 Atherosclerotic heart disease of native coronary artery without angina pectoris: Secondary | ICD-10-CM | POA: Insufficient documentation

## 2018-10-09 DIAGNOSIS — Z951 Presence of aortocoronary bypass graft: Secondary | ICD-10-CM | POA: Diagnosis not present

## 2018-10-09 DIAGNOSIS — N183 Chronic kidney disease, stage 3 (moderate): Secondary | ICD-10-CM | POA: Insufficient documentation

## 2018-10-09 DIAGNOSIS — M1990 Unspecified osteoarthritis, unspecified site: Secondary | ICD-10-CM | POA: Diagnosis not present

## 2018-10-09 DIAGNOSIS — Z794 Long term (current) use of insulin: Secondary | ICD-10-CM | POA: Diagnosis not present

## 2018-10-09 DIAGNOSIS — D72829 Elevated white blood cell count, unspecified: Secondary | ICD-10-CM

## 2018-10-09 DIAGNOSIS — E669 Obesity, unspecified: Secondary | ICD-10-CM | POA: Insufficient documentation

## 2018-10-09 DIAGNOSIS — F419 Anxiety disorder, unspecified: Secondary | ICD-10-CM | POA: Diagnosis not present

## 2018-10-09 DIAGNOSIS — I129 Hypertensive chronic kidney disease with stage 1 through stage 4 chronic kidney disease, or unspecified chronic kidney disease: Secondary | ICD-10-CM | POA: Diagnosis present

## 2018-10-09 DIAGNOSIS — M109 Gout, unspecified: Secondary | ICD-10-CM | POA: Diagnosis not present

## 2018-10-09 DIAGNOSIS — D638 Anemia in other chronic diseases classified elsewhere: Secondary | ICD-10-CM

## 2018-10-09 DIAGNOSIS — E785 Hyperlipidemia, unspecified: Secondary | ICD-10-CM | POA: Insufficient documentation

## 2018-10-09 LAB — CBC WITH DIFFERENTIAL (CANCER CENTER ONLY)
Abs Immature Granulocytes: 0.02 10*3/uL (ref 0.00–0.07)
Basophils Absolute: 0 10*3/uL (ref 0.0–0.1)
Basophils Relative: 0 %
Eosinophils Absolute: 0.1 10*3/uL (ref 0.0–0.5)
Eosinophils Relative: 1 %
HCT: 30.4 % — ABNORMAL LOW (ref 36.0–46.0)
Hemoglobin: 9.5 g/dL — ABNORMAL LOW (ref 12.0–15.0)
Immature Granulocytes: 0 %
Lymphocytes Relative: 9 %
Lymphs Abs: 0.5 10*3/uL — ABNORMAL LOW (ref 0.7–4.0)
MCH: 30.1 pg (ref 26.0–34.0)
MCHC: 31.3 g/dL (ref 30.0–36.0)
MCV: 96.2 fL (ref 80.0–100.0)
Monocytes Absolute: 0.4 10*3/uL (ref 0.1–1.0)
Monocytes Relative: 8 %
Neutro Abs: 4.3 10*3/uL (ref 1.7–7.7)
Neutrophils Relative %: 82 %
Platelet Count: 161 10*3/uL (ref 150–400)
RBC: 3.16 MIL/uL — ABNORMAL LOW (ref 3.87–5.11)
RDW: 18.4 % — ABNORMAL HIGH (ref 11.5–15.5)
WBC Count: 5.3 10*3/uL (ref 4.0–10.5)
nRBC: 0 % (ref 0.0–0.2)

## 2018-10-09 LAB — CMP (CANCER CENTER ONLY)
ALT: <6 U/L (ref 0–44)
AST: 9 U/L — ABNORMAL LOW (ref 15–41)
Albumin: 3 g/dL — ABNORMAL LOW (ref 3.5–5.0)
Alkaline Phosphatase: 57 U/L (ref 38–126)
Anion gap: 13 (ref 5–15)
BUN: 57 mg/dL — ABNORMAL HIGH (ref 8–23)
CO2: 25 mmol/L (ref 22–32)
Calcium: 8.8 mg/dL — ABNORMAL LOW (ref 8.9–10.3)
Chloride: 107 mmol/L (ref 98–111)
Creatinine: 5.84 mg/dL (ref 0.44–1.00)
GFR, Est AFR Am: 8 mL/min — ABNORMAL LOW (ref >60)
GFR, Estimated: 7 mL/min — ABNORMAL LOW (ref >60)
Glucose, Bld: 151 mg/dL — ABNORMAL HIGH (ref 70–99)
Potassium: 3.9 mmol/L (ref 3.5–5.1)
Sodium: 145 mmol/L (ref 135–145)
Total Bilirubin: 1.1 mg/dL (ref 0.3–1.2)
Total Protein: 5.7 g/dL — ABNORMAL LOW (ref 6.5–8.1)

## 2018-10-09 MED ORDER — DARBEPOETIN ALFA 300 MCG/0.6ML IJ SOSY
300.0000 ug | PREFILLED_SYRINGE | Freq: Once | INTRAMUSCULAR | Status: AC
  Administered 2018-10-09: 300 ug via SUBCUTANEOUS

## 2018-10-09 MED ORDER — DARBEPOETIN ALFA 300 MCG/0.6ML IJ SOSY
PREFILLED_SYRINGE | INTRAMUSCULAR | Status: AC
  Filled 2018-10-09: qty 0.6

## 2018-10-09 NOTE — Telephone Encounter (Signed)
She has chronic renal sufficiency.  Nothing need to be done at this point.

## 2018-10-09 NOTE — Telephone Encounter (Signed)
Received call report from [facility].  "Today's Creat = 5.84 mg/dL."  Message left for collaborative with results.

## 2018-10-09 NOTE — Patient Instructions (Signed)

## 2018-10-29 ENCOUNTER — Other Ambulatory Visit: Payer: Self-pay | Admitting: Medical Oncology

## 2018-10-29 DIAGNOSIS — D638 Anemia in other chronic diseases classified elsewhere: Secondary | ICD-10-CM

## 2018-10-30 ENCOUNTER — Inpatient Hospital Stay: Payer: Medicare Other

## 2018-10-30 ENCOUNTER — Other Ambulatory Visit: Payer: Self-pay

## 2018-10-30 VITALS — BP 153/46 | HR 46 | Temp 98.1°F

## 2018-10-30 DIAGNOSIS — I129 Hypertensive chronic kidney disease with stage 1 through stage 4 chronic kidney disease, or unspecified chronic kidney disease: Secondary | ICD-10-CM | POA: Diagnosis not present

## 2018-10-30 DIAGNOSIS — N184 Chronic kidney disease, stage 4 (severe): Secondary | ICD-10-CM

## 2018-10-30 DIAGNOSIS — D638 Anemia in other chronic diseases classified elsewhere: Secondary | ICD-10-CM

## 2018-10-30 LAB — CBC WITH DIFFERENTIAL (CANCER CENTER ONLY)
Abs Immature Granulocytes: 0.01 10*3/uL (ref 0.00–0.07)
Basophils Absolute: 0 10*3/uL (ref 0.0–0.1)
Basophils Relative: 1 %
Eosinophils Absolute: 0.1 10*3/uL (ref 0.0–0.5)
Eosinophils Relative: 3 %
HCT: 32.9 % — ABNORMAL LOW (ref 36.0–46.0)
Hemoglobin: 10.2 g/dL — ABNORMAL LOW (ref 12.0–15.0)
Immature Granulocytes: 0 %
Lymphocytes Relative: 12 %
Lymphs Abs: 0.5 10*3/uL — ABNORMAL LOW (ref 0.7–4.0)
MCH: 29.2 pg (ref 26.0–34.0)
MCHC: 31 g/dL (ref 30.0–36.0)
MCV: 94.3 fL (ref 80.0–100.0)
Monocytes Absolute: 0.3 10*3/uL (ref 0.1–1.0)
Monocytes Relative: 8 %
Neutro Abs: 3.2 10*3/uL (ref 1.7–7.7)
Neutrophils Relative %: 76 %
Platelet Count: 165 10*3/uL (ref 150–400)
RBC: 3.49 MIL/uL — ABNORMAL LOW (ref 3.87–5.11)
RDW: 17.2 % — ABNORMAL HIGH (ref 11.5–15.5)
WBC Count: 4.2 10*3/uL (ref 4.0–10.5)
nRBC: 0 % (ref 0.0–0.2)

## 2018-10-30 MED ORDER — DARBEPOETIN ALFA 300 MCG/0.6ML IJ SOSY
300.0000 ug | PREFILLED_SYRINGE | Freq: Once | INTRAMUSCULAR | Status: AC
  Administered 2018-10-30: 300 ug via SUBCUTANEOUS

## 2018-10-30 MED ORDER — DARBEPOETIN ALFA 300 MCG/0.6ML IJ SOSY
PREFILLED_SYRINGE | INTRAMUSCULAR | Status: AC
  Filled 2018-10-30: qty 0.6

## 2018-11-03 ENCOUNTER — Ambulatory Visit: Payer: Medicare Other

## 2018-11-20 ENCOUNTER — Other Ambulatory Visit: Payer: Self-pay

## 2018-11-20 ENCOUNTER — Inpatient Hospital Stay: Payer: Medicare Other | Attending: Internal Medicine

## 2018-11-20 ENCOUNTER — Inpatient Hospital Stay: Payer: Medicare Other

## 2018-11-20 ENCOUNTER — Other Ambulatory Visit: Payer: Self-pay | Admitting: Medical Oncology

## 2018-11-20 VITALS — BP 158/48 | HR 55 | Temp 98.0°F | Resp 18

## 2018-11-20 DIAGNOSIS — D638 Anemia in other chronic diseases classified elsewhere: Secondary | ICD-10-CM

## 2018-11-20 DIAGNOSIS — D631 Anemia in chronic kidney disease: Secondary | ICD-10-CM | POA: Diagnosis present

## 2018-11-20 DIAGNOSIS — I509 Heart failure, unspecified: Secondary | ICD-10-CM | POA: Insufficient documentation

## 2018-11-20 DIAGNOSIS — I129 Hypertensive chronic kidney disease with stage 1 through stage 4 chronic kidney disease, or unspecified chronic kidney disease: Secondary | ICD-10-CM | POA: Insufficient documentation

## 2018-11-20 DIAGNOSIS — N184 Chronic kidney disease, stage 4 (severe): Secondary | ICD-10-CM

## 2018-11-20 DIAGNOSIS — N189 Chronic kidney disease, unspecified: Secondary | ICD-10-CM | POA: Diagnosis present

## 2018-11-20 LAB — CBC WITH DIFFERENTIAL (CANCER CENTER ONLY)
Abs Immature Granulocytes: 0.02 10*3/uL (ref 0.00–0.07)
Basophils Absolute: 0 10*3/uL (ref 0.0–0.1)
Basophils Relative: 0 %
Eosinophils Absolute: 0.1 10*3/uL (ref 0.0–0.5)
Eosinophils Relative: 2 %
HCT: 29.9 % — ABNORMAL LOW (ref 36.0–46.0)
Hemoglobin: 9.3 g/dL — ABNORMAL LOW (ref 12.0–15.0)
Immature Granulocytes: 0 %
Lymphocytes Relative: 7 %
Lymphs Abs: 0.4 10*3/uL — ABNORMAL LOW (ref 0.7–4.0)
MCH: 29.3 pg (ref 26.0–34.0)
MCHC: 31.1 g/dL (ref 30.0–36.0)
MCV: 94.3 fL (ref 80.0–100.0)
Monocytes Absolute: 0.4 10*3/uL (ref 0.1–1.0)
Monocytes Relative: 7 %
Neutro Abs: 4.6 10*3/uL (ref 1.7–7.7)
Neutrophils Relative %: 84 %
Platelet Count: 166 10*3/uL (ref 150–400)
RBC: 3.17 MIL/uL — ABNORMAL LOW (ref 3.87–5.11)
RDW: 18.8 % — ABNORMAL HIGH (ref 11.5–15.5)
WBC Count: 5.5 10*3/uL (ref 4.0–10.5)
nRBC: 0 % (ref 0.0–0.2)

## 2018-11-20 MED ORDER — DARBEPOETIN ALFA 300 MCG/0.6ML IJ SOSY
PREFILLED_SYRINGE | INTRAMUSCULAR | Status: AC
  Filled 2018-11-20: qty 0.6

## 2018-11-20 MED ORDER — DARBEPOETIN ALFA 300 MCG/0.6ML IJ SOSY
300.0000 ug | PREFILLED_SYRINGE | Freq: Once | INTRAMUSCULAR | Status: AC
  Administered 2018-11-20: 300 ug via SUBCUTANEOUS

## 2018-11-27 ENCOUNTER — Other Ambulatory Visit: Payer: Self-pay

## 2018-11-27 ENCOUNTER — Encounter: Payer: Self-pay | Admitting: Internal Medicine

## 2018-11-27 ENCOUNTER — Inpatient Hospital Stay: Payer: Medicare Other

## 2018-11-27 ENCOUNTER — Inpatient Hospital Stay (HOSPITAL_BASED_OUTPATIENT_CLINIC_OR_DEPARTMENT_OTHER): Payer: Medicare Other | Admitting: Internal Medicine

## 2018-11-27 VITALS — BP 149/41 | HR 55 | Temp 98.4°F | Resp 20 | Ht 62.0 in | Wt 182.0 lb

## 2018-11-27 DIAGNOSIS — I10 Essential (primary) hypertension: Secondary | ICD-10-CM

## 2018-11-27 DIAGNOSIS — I129 Hypertensive chronic kidney disease with stage 1 through stage 4 chronic kidney disease, or unspecified chronic kidney disease: Secondary | ICD-10-CM

## 2018-11-27 DIAGNOSIS — N189 Chronic kidney disease, unspecified: Secondary | ICD-10-CM

## 2018-11-27 DIAGNOSIS — D631 Anemia in chronic kidney disease: Secondary | ICD-10-CM | POA: Diagnosis not present

## 2018-11-27 DIAGNOSIS — I509 Heart failure, unspecified: Secondary | ICD-10-CM | POA: Diagnosis not present

## 2018-11-27 DIAGNOSIS — D638 Anemia in other chronic diseases classified elsewhere: Secondary | ICD-10-CM

## 2018-11-27 DIAGNOSIS — N184 Chronic kidney disease, stage 4 (severe): Secondary | ICD-10-CM

## 2018-11-27 LAB — CBC WITH DIFFERENTIAL (CANCER CENTER ONLY)
Abs Immature Granulocytes: 0.02 10*3/uL (ref 0.00–0.07)
Basophils Absolute: 0 10*3/uL (ref 0.0–0.1)
Basophils Relative: 0 %
Eosinophils Absolute: 0.2 10*3/uL (ref 0.0–0.5)
Eosinophils Relative: 3 %
HCT: 31.6 % — ABNORMAL LOW (ref 36.0–46.0)
Hemoglobin: 9.9 g/dL — ABNORMAL LOW (ref 12.0–15.0)
Immature Granulocytes: 0 %
Lymphocytes Relative: 7 %
Lymphs Abs: 0.4 10*3/uL — ABNORMAL LOW (ref 0.7–4.0)
MCH: 29.6 pg (ref 26.0–34.0)
MCHC: 31.3 g/dL (ref 30.0–36.0)
MCV: 94.6 fL (ref 80.0–100.0)
Monocytes Absolute: 0.3 10*3/uL (ref 0.1–1.0)
Monocytes Relative: 5 %
Neutro Abs: 5.3 10*3/uL (ref 1.7–7.7)
Neutrophils Relative %: 85 %
Platelet Count: 161 10*3/uL (ref 150–400)
RBC: 3.34 MIL/uL — ABNORMAL LOW (ref 3.87–5.11)
RDW: 19.4 % — ABNORMAL HIGH (ref 11.5–15.5)
WBC Count: 6.2 10*3/uL (ref 4.0–10.5)
nRBC: 0.6 % — ABNORMAL HIGH (ref 0.0–0.2)

## 2018-11-27 LAB — IRON AND TIBC
Iron: 40 ug/dL — ABNORMAL LOW (ref 41–142)
Saturation Ratios: 22 % (ref 21–57)
TIBC: 184 ug/dL — ABNORMAL LOW (ref 236–444)
UIBC: 144 ug/dL (ref 120–384)

## 2018-11-27 LAB — FERRITIN: Ferritin: 230 ng/mL (ref 11–307)

## 2018-11-27 NOTE — Patient Instructions (Signed)
Food Basics for Chronic Kidney Disease  When your kidneys are not working well, they cannot remove waste and excess substances from your blood as effectively as they did before. This can lead to a buildup and imbalance of these substances, which can worsen kidney damage and affect how your body functions. Certain foods lead to a buildup of these substances in the body. By changing your diet as recommended by your diet and nutrition specialist (dietitian) or health care provider, you could help prevent further kidney damage and delay or prevent the need for dialysis.  What are tips for following this plan?  General instructions     Work with your health care provider and dietitian to develop a meal plan that is right for you. Foods you can eat, limit, or avoid will be different for each person depending on the stage of kidney disease and any other existing health conditions.   Talk with your health care provider about whether you should take a vitamin and mineral supplement.   Use standard measuring cups and spoons to measure servings of foods. Use a kitchen scale to measure portions of protein foods.   If directed by your health care provider, avoid drinking too much fluid. Measure and count all liquids, including water, ice, soups, flavored gelatin, and frozen desserts such as popsicles or ice cream.  Reading food labels   Check the amount of sodium in foods. Choose foods that have less than 300 milligrams (mg) per serving.   Check the ingredient list for phosphorus or potassium-based additives or preservatives.   Check the amount of saturated and trans fat. Limit or avoid these fats as told by your dietitian.  Shopping   Avoid buying foods that are:  ? Processed, frozen, or prepackaged.  ? Calcium-enriched or fortified.   Do not buy foods that have salt or sodium listed among the first five ingredients.   Do not buy canned vegetables.  Cooking   Replace animal proteins, such as meat, fish, eggs, or  dairy, with plant proteins from beans, nuts, and soy.  ? Use soy milk instead of cow's milk.  ? Add beans or tofu to soups, casseroles, or pasta dishes instead of meat.   Soak vegetables, such as potatoes, before cooking to reduce potassium. To do this:  ? Peel and cut into small pieces.  ? Soak in warm water for at least 2 hours. For every 1 cup of vegetables, use 10 cups of water.  ? Drain and rinse with warm water.  ? Boil for at least 5 minutes.  Meal planning   Limit the amount of protein from plant and animal sources you eat each day.   Do not add salt to food when cooking or before eating.   Eat meals and snacks at around the same time each day.  If you have diabetes:   If you have diabetes (diabetes mellitus) and chronic kidney disease, it is important to keep your blood glucose in the target range recommended by your health care provider. Follow your diabetes management plan. This may include:  ? Checking your blood glucose regularly.  ? Taking oral medicines, insulin, or both.  ? Exercising for at least 30 minutes on 5 or more days each week, or as told by your health care provider.  ? Tracking how many servings of carbohydrates you eat at each meal.   You may be given specific guidelines on how much of certain foods and nutrients you may eat, depending on your   stage of kidney disease and whether you have high blood pressure (hypertension). Follow your meal plan as told by your dietitian.  What nutrients should be limited?  The items listed are not a complete list. Talk with your dietitian about what dietary choices are best for you.  Potassium  Potassium affects how steadily your heart beats. If too much potassium builds up in your blood, it can cause an irregular heartbeat or even a heart attack.  You may need to eat less potassium, depending on your blood potassium levels and the stage of kidney disease. Talk to your dietitian about how much potassium you may have each day.  You may need to limit  or avoid foods that are high in potassium, such as:   Milk and soy milk.   Fruits, such as bananas, papaya, apricots, nectarines, melon, prunes, raisins, kiwi, and oranges.   Vegetables, such as potatoes, sweet potatoes, yams, tomatoes, leafy greens, beets, okra, avocado, pumpkin, and winter squash.   White and lima beans.  Phosphorus  Phosphorus is a mineral found in your bones. A balance between calcium and phosphorous is needed to build and maintain healthy bones. Too much phosphorus pulls calcium from your bones. This can make your bones weak and more likely to break. Too much phosphorus can also make your skin itch.  You may need to eat less phosphorus depending on your blood phosphorus levels and the stage of kidney disease. Talk to your dietitian about how much potassium you may have each day. You may need to take medicine to lower your blood phosphorus levels if diet changes do not help.  You may need to limit or avoid foods that are high in phosphorus, such as:   Milk and dairy products.   Dried beans and peas.   Tofu, soy milk, and other soy-based meat replacements.   Colas.   Nuts and peanut butter.   Meat, poultry, and fish.   Bran cereals and oatmeals.  Protein  Protein helps you to make and keep muscle. It also helps in the repair of your body's cells and tissues. One of the natural breakdown products of protein is a waste product called urea. When your kidneys are not working properly, they cannot remove wastes, such as urea, like they did before you developed chronic kidney disease. Reducing how much protein you eat can help prevent a buildup of urea in your blood.  Depending on your stage of kidney disease, you may need to limit foods that are high in protein. Sources of animal protein include:   Meat (all types).   Fish and seafood.   Poultry.   Eggs.   Dairy.  Other protein foods include:   Beans and legumes.   Nuts and nut butter.   Soy and tofu.  Sodium  Sodium, which is found  in salt, helps maintain a healthy balance of fluids in your body. Too much sodium can increase your blood pressure and have a negative effect on the function of your heart and lungs. Too much sodium can also cause your body to retain too much fluid, making your kidneys work harder.  Most people should have less than 2,300 milligrams (mg) of sodium each day. If you have hypertension, you may need to limit your sodium to 1,500 mg each day. Talk to your dietitian about how much sodium you may have each day.  You may need to limit or avoid foods that are high in sodium, such as:   Salt seasonings.     Soy sauce.   Cured and processed meats.   Salted crackers and snack foods.   Fast food.   Canned soups and most canned foods.   Pickled foods.   Vegetable juice.   Boxed mixes or ready-to-eat boxed meals and side dishes.   Bottled dressings, sauces, and marinades.  Summary   Chronic kidney disease can lead to a buildup and imbalance of waste and excess substances in the body. Certain foods lead to a buildup of these substances. By adjusting your intake of these foods, you could help prevent more kidney damage and delay or prevent the need for dialysis.   Food adjustments are different for each person with chronic kidney disease. Work with a dietitian to set up nutrient goals and a meal plan that is right for you.   If you have diabetes and chronic kidney disease, it is important to keep your blood glucose in the target range recommended by your health care provider.  This information is not intended to replace advice given to you by your health care provider. Make sure you discuss any questions you have with your health care provider.  Document Released: 09/15/2002 Document Revised: 06/20/2016 Document Reviewed: 06/20/2016  Elsevier Interactive Patient Education  2019 [employer]

## 2018-11-27 NOTE — Progress Notes (Signed)
Mailed information on Chronic Kidney disease food basics pt.

## 2018-11-27 NOTE — Progress Notes (Signed)
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  PRIOR THERAPY: None  CURRENT THERAPY: Aranesp 300 g subcutaneously every 3 weeks. First dose 03/06/2017. Over-the-counter ferrous sulfate.  INTERVAL HISTORY: Norma [name] 73 y.o. female returns to the clinic today for follow-up visit.  The patient is feeling fine today except for fatigue.  She also had swelling of the lower extremities and she was restarted on Lasix by her cardiologist for acute congestive heart failure.  The patient denied having any chest pain, shortness of breath, cough or hemoptysis.  She is also followed by Dr. Justin Mend for her kidney disease.  She denied having any fever or chills.  She has no nausea, vomiting, diarrhea or constipation.  She continues to tolerate her treatment with Aranesp fairly well.  She had repeat CBC, iron study and ferritin performed earlier today and she is here for evaluation and discussion of her lab results.  MEDICAL HISTORY: Past Medical History:  Diagnosis Date  . Anxiety   . Carotid artery occlusion   . CKD (chronic kidney disease) stage 3, GFR 30-59 ml/min (HCC) 04/18/2014  . Claudication ([place])   . Constipation    Due to patient taking iron supplements  . Coronary artery disease   . DDD (degenerative disc disease)   . Diabetes mellitus   . Diabetic coma ([place])   . DJD (degenerative joint disease)   . DJD (degenerative joint disease)   . Edema    Bilateral lower extermities  . Gout   . Hepatitis    Hep C, has been treated with "Maverick" - cured per pt  . Hyperlipidemia   . Hypertension   . Iron deficiency anemia   . Leg pain   . Obesity   . PAD (peripheral artery disease) ([place])   . Shortness of breath    exertion    ALLERGIES:  is allergic to sulfasalazine;  iohexol; and sulfa antibiotics.  MEDICATIONS:  Current Outpatient Medications  Medication Sig Dispense Refill  . acetaminophen (TYLENOL) 500 MG tablet Take 500 mg by mouth every 6 (six) hours as needed for moderate pain or headache.     . allopurinol (ZYLOPRIM) 100 MG tablet Take 100 mg by mouth daily as needed (for gout).     Marland Kitchen aspirin EC 81 MG tablet Take 81 mg by mouth daily.    . calcitRIOL (ROCALTROL) 0.5 MCG capsule Take 0.5 mcg by mouth daily.    . calcium acetate (PHOSLO) 667 MG capsule Take 667 mg by mouth 3 (three) times daily before meals.   6  . carvedilol (COREG) 12.5 MG tablet Take 1 tablet (12.5 mg total) by mouth 2 (two) times daily. 180 tablet 3  . Cholecalciferol (VITAMIN D) 2000 units tablet Take 2,000 Units by mouth 2 (two) times a week.     . cloNIDine (CATAPRES) 0.3 MG tablet Take 0.3 mg by mouth 2 (two) times daily.     . colesevelam (WELCHOL) 625 MG tablet Take 1,875 mg by mouth 2 (two) times daily with a meal.     . Cyanocobalamin (VITAMIN B-12) 5000 MCG TBDP Take 5,000 mcg by mouth daily.     . ferrous sulfate 325 (65 FE) MG tablet Take 325 mg by mouth 2 (two) times daily.     . furosemide (LASIX) 80 MG  tablet Take 160 mg by mouth daily.     Marland Kitchen glucose blood (PRECISION QID TEST) test strip by Other route Three (3) times a day.    . hydrALAZINE (APRESOLINE) 50 MG tablet Take 50 mg by mouth 3 (three) times daily.    Marland Kitchen HYDROcodone-acetaminophen (NORCO) 5-325 MG tablet Take 1 tablet by mouth every 6 (six) hours as needed for moderate pain or severe pain. 10 tablet 0  . HYDROcodone-acetaminophen (NORCO/VICODIN) 5-325 MG tablet Take 1 tablet by mouth every 6 (six) hours as needed for moderate pain. (Patient taking differently: Take 1 tablet by mouth every 6 (six) hours as needed for severe pain. ) 6 tablet 0  . insulin glargine (LANTUS) 100 UNIT/ML injection Inject 0.2 mLs (20 Units total) into the skin 2 (two) times daily. (Patient taking differently: Inject 20-24 Units into  the skin See admin instructions. Inject 24 units SQ in the morning and inject 20 units SQ in the evening) 10 mL 11  . isosorbide dinitrate (ISORDIL) 20 MG tablet Take 20 mg by mouth 2 (two) times daily.     . Multiple Vitamins-Minerals (MULTIVITAMIN PO) Take 1 tablet by mouth 2 (two) times a week.     Marland Kitchen omeprazole (PRILOSEC) 40 MG capsule Take 40 mg by mouth at bedtime.     . promethazine (PHENERGAN) 12.5 MG tablet Take 12.5 mg by mouth every 8 (eight) hours as needed for nausea or vomiting.     . Sennosides 25 MG TABS Take 25 mg by mouth every 3 (three) days.    . sodium bicarbonate 650 MG tablet Take 650 mg by mouth 2 (two) times daily.     No current facility-administered medications for this visit.     SURGICAL HISTORY:  Past Surgical History:  Procedure Laterality Date  . AORTA - BILATERAL FEMORAL ARTERY BYPASS GRAFT  05/25/2011   Procedure: AORTA BIFEMORAL BYPASS GRAFT;  Surgeon: Mal Misty, MD;  Location: [facility];  Service: Vascular;  Laterality: N/A;  . APPLICATION OF WOUND VAC Right 04/23/2016   Procedure: APPLICATION OF WOUND VAC CHANGED;  Surgeon: Meredith Pel, MD;  Location: [facility];  Service: Orthopedics;  Laterality: Right;  . AV FISTULA PLACEMENT Left 03/03/2018   Procedure: BRACHIOCEPHALIC ARTERIOVENOUS FISTULA CREATION LEFT ARM;  Surgeon: Elam Dutch, MD;  Location: [facility];  Service: Vascular;  Laterality: Left;  . BREAST EXCISIONAL BIOPSY Right   . BREAST SURGERY    . CARDIAC CATHETERIZATION  12/01/2009   EF 65%  . CARDIOVASCULAR STRESS TEST  11/28/2009   EF 75%  . COLONOSCOPY W/ POLYPECTOMY    . CORONARY ARTERY BYPASS GRAFT  12/08/2009   LIMA GRAFT TO THE DISTAL LAD AND SAPHENOUS VEIN GRAFT TO THE OBTUSE MARGINAL VESSEL  . EYE SURGERY    . FISTULA SUPERFICIALIZATION Left 07/28/2018   Procedure: LEFT ARM FISTULA SUPERFICIALIZATION;  Surgeon: Elam Dutch, MD;  Location: [facility];  Service: Vascular;  Laterality: Left;  . I&D EXTREMITY Right 04/23/2016    Procedure: IRRIGATION AND DEBRIDEMENT EXTREMITY;  Surgeon: Meredith Pel, MD;  Location: [facility];  Service: Orthopedics;  Laterality: Right;  . INCISION AND DRAINAGE HIP Right 04/20/2016   Procedure: IRRIGATION AND DEBRIDEMENT HIP WITH POLY EXCHANGE, ABX BEAD PLACEMENT, WOUND VAC ;  Surgeon: Meredith Pel, MD;  Location: [facility];  Service: Orthopedics;  Laterality: Right;  RIGHT HIP SUPERFICIAL I&D, POSSIBLE DEEP I&D, LINER EXCHANGE, ABX BEAD PLACEMENT, WOUND VAC.   Marland Kitchen PR VEIN BYPASS GRAFT,AORTO-FEM-POP  05/25/11  .  REMOVAL OF FIBROUS CYST FROM RIGHT BREAST  10+ YEARS  . RETINAL DETACHMENT SURGERY  10+ YEARS   LEFT EYE  . TOTAL HIP ARTHROPLASTY Right 03/06/2016  . TOTAL HIP ARTHROPLASTY Right 03/06/2016   Procedure: RIGHT TOTAL HIP ARTHROPLASTY ANTERIOR APPROACH;  Surgeon: Meredith Pel, MD;  Location: [facility];  Service: Orthopedics;  Laterality: Right;  . TRANSTHORACIC ECHOCARDIOGRAM  12/01/2009   EF 60-65%    REVIEW OF SYSTEMS:  A comprehensive review of systems was negative except for: Constitutional: positive for fatigue   PHYSICAL EXAMINATION: General appearance: alert, cooperative, fatigued and no distress Head: Normocephalic, without obvious abnormality, atraumatic Neck: no adenopathy, no JVD, supple, symmetrical, trachea midline and thyroid not enlarged, symmetric, no tenderness/mass/nodules Lymph nodes: Cervical, supraclavicular, and axillary nodes normal. Resp: clear to auscultation bilaterally Back: symmetric, no curvature. ROM normal. No CVA tenderness. Cardio: regular rate and rhythm, S1, S2 normal, no murmur, click, rub or gallop GI: soft, non-tender; bowel sounds normal; no masses,  no organomegaly Extremities: extremities normal, atraumatic, no cyanosis or edema  ECOG PERFORMANCE STATUS: 1 - Symptomatic but completely ambulatory  Blood pressure (!) 149/41, pulse (!) 55, temperature 98.4 F (36.9 C), temperature source Oral, resp. rate 20, height 5\' 2"  (1.575 m),  weight 182 lb (82.6 kg), SpO2 98 %.  LABORATORY DATA: Lab Results  Component Value Date   WBC 6.2 11/27/2018   HGB 9.9 (L) 11/27/2018   HCT 31.6 (L) 11/27/2018   MCV 94.6 11/27/2018   PLT 161 11/27/2018      Chemistry      Component Value Date/Time   NA 145 10/09/2018 1054   NA 143 06/28/2017 1239   K 3.9 10/09/2018 1054   K 4.8 06/28/2017 1239   CL 107 10/09/2018 1054   CO2 25 10/09/2018 1054   CO2 22 06/28/2017 1239   BUN 57 (H) 10/09/2018 1054   BUN 48.6 (H) 06/28/2017 1239   CREATININE 5.84 (HH) 10/09/2018 1054   CREATININE 2.9 (H) 06/28/2017 1239      Component Value Date/Time   CALCIUM 8.8 (L) 10/09/2018 1054   CALCIUM 8.3 (L) 06/28/2017 1239   ALKPHOS 57 10/09/2018 1054   ALKPHOS 86 06/28/2017 1239   AST 9 (L) 10/09/2018 1054   AST 11 06/28/2017 1239   ALT <6 10/09/2018 1054   ALT <6 06/28/2017 1239   BILITOT 1.1 10/09/2018 1054   BILITOT 0.28 06/28/2017 1239       RADIOGRAPHIC STUDIES: No results found.  ASSESSMENT AND PLAN:  This is a very pleasant 73 years old African-American female with anemia of chronic disease secondary to renal insufficiency plus/minus iron deficiency. The patient is currently on treatment with Aranesp 300 mcg every 3 weeks in addition to over-the-counter oral iron tablets.  The patient has been tolerating this treatment well with no concerning adverse effects. CBC today showed a stable hemoglobin and hematocrit. I recommended for the patient to continue her current treatment with Aranesp with the same dose. For the hypertension and congestive heart failure, the patient will continue follow-up visit by her cardiologist and primary care physician. I will see her back for follow-up visit in 3 months for evaluation with repeat CBC, iron study and ferritin. The patient voices understanding of current disease status and treatment options and is in agreement with the current care plan. All questions were answered. The patient knows to  call the clinic with any problems, questions or concerns. We can certainly see the patient much sooner if necessary.  Disclaimer:  This note was dictated with voice recognition software. Similar sounding words can inadvertently be transcribed and may not be corrected upon review.

## 2018-12-02 ENCOUNTER — Telehealth: Payer: Self-pay | Admitting: Internal Medicine

## 2018-12-02 NOTE — Telephone Encounter (Signed)
Called patient per 5/21 sch message - unable to reach patient due to blocked call - mailed letter with appt date and time

## 2018-12-04 ENCOUNTER — Other Ambulatory Visit: Payer: Self-pay | Admitting: Nurse Practitioner

## 2018-12-04 DIAGNOSIS — K7469 Other cirrhosis of liver: Secondary | ICD-10-CM

## 2018-12-10 ENCOUNTER — Other Ambulatory Visit: Payer: Self-pay | Admitting: Medical Oncology

## 2018-12-10 ENCOUNTER — Telehealth: Payer: Self-pay | Admitting: Cardiology

## 2018-12-10 DIAGNOSIS — D638 Anemia in other chronic diseases classified elsewhere: Secondary | ICD-10-CM

## 2018-12-10 NOTE — Telephone Encounter (Signed)
home phone/ consent/ my chart declined/ pre reg completed °

## 2018-12-11 ENCOUNTER — Inpatient Hospital Stay: Payer: Medicare Other

## 2018-12-11 ENCOUNTER — Other Ambulatory Visit: Payer: Self-pay

## 2018-12-11 ENCOUNTER — Inpatient Hospital Stay: Payer: Medicare Other | Attending: Internal Medicine

## 2018-12-11 VITALS — BP 156/42 | HR 61 | Temp 98.2°F | Resp 18

## 2018-12-11 DIAGNOSIS — D631 Anemia in chronic kidney disease: Secondary | ICD-10-CM | POA: Insufficient documentation

## 2018-12-11 DIAGNOSIS — N184 Chronic kidney disease, stage 4 (severe): Secondary | ICD-10-CM

## 2018-12-11 DIAGNOSIS — I129 Hypertensive chronic kidney disease with stage 1 through stage 4 chronic kidney disease, or unspecified chronic kidney disease: Secondary | ICD-10-CM | POA: Insufficient documentation

## 2018-12-11 DIAGNOSIS — N189 Chronic kidney disease, unspecified: Secondary | ICD-10-CM | POA: Diagnosis present

## 2018-12-11 DIAGNOSIS — D638 Anemia in other chronic diseases classified elsewhere: Secondary | ICD-10-CM

## 2018-12-11 LAB — CBC WITH DIFFERENTIAL (CANCER CENTER ONLY)
Basophils Absolute: 0 10*3/uL (ref 0.0–0.1)
Basophils Relative: 0 %
Eosinophils Absolute: 0.1 10*3/uL (ref 0.0–0.5)
Eosinophils Relative: 2 %
HCT: 31.2 % — ABNORMAL LOW (ref 36.0–46.0)
Hemoglobin: 9.6 g/dL — ABNORMAL LOW (ref 12.0–15.0)
Lymphocytes Relative: 7 %
Lymphs Abs: 0.5 10*3/uL — ABNORMAL LOW (ref 0.7–4.0)
MCH: 29 pg (ref 26.0–34.0)
MCHC: 30.8 g/dL (ref 30.0–36.0)
MCV: 94.3 fL (ref 80.0–100.0)
Monocytes Absolute: 0.6 10*3/uL (ref 0.1–1.0)
Monocytes Relative: 9 %
Neutro Abs: 5.4 10*3/uL (ref 1.7–7.7)
Neutrophils Relative %: 82 %
Platelet Count: 255 10*3/uL (ref 150–400)
RBC: 3.31 MIL/uL — ABNORMAL LOW (ref 3.87–5.11)
RDW: 18.3 % — ABNORMAL HIGH (ref 11.5–15.5)
WBC Count: 6.6 10*3/uL (ref 4.0–10.5)
nRBC: 0 % (ref 0.0–0.2)

## 2018-12-11 MED ORDER — DARBEPOETIN ALFA 300 MCG/0.6ML IJ SOSY
PREFILLED_SYRINGE | INTRAMUSCULAR | Status: AC
  Filled 2018-12-11: qty 0.6

## 2018-12-11 MED ORDER — DARBEPOETIN ALFA 300 MCG/0.6ML IJ SOSY
300.0000 ug | PREFILLED_SYRINGE | Freq: Once | INTRAMUSCULAR | Status: AC
  Administered 2018-12-11: 300 ug via SUBCUTANEOUS

## 2018-12-11 NOTE — Progress Notes (Signed)
Virtual Visit via Telephone Note   This visit type was conducted due to national recommendations for restrictions regarding the COVID-19 Pandemic (e.g. social distancing) in an effort to limit this patient's exposure and mitigate transmission in our community.  Due to her co-morbid illnesses, this patient is at least at moderate risk for complications without adequate follow up.  This format is felt to be most appropriate for this patient at this time.  The patient did not have access to video technology/had technical difficulties with video requiring transitioning to audio format only (telephone).  All issues noted in this document were discussed and addressed.  No physical exam could be performed with this format.  Please refer to the patient's chart for her  consent to telehealth for [facility].   Date:  12/15/2018   ID:  Norma [name], DOB [birth year], MRN [id number]  Patient Location: Home Provider Location: Office  PCP:  Bartholome Bill, MD  Cardiologist:  Blease Capaldi [place], MD  Electrophysiologist:  None   Evaluation Performed:  Follow-Up Visit  Chief Complaint:  Follow up CAD/PAD  History of Present Illness:    Norma [name] is a 73 y.o. female seen for followup CAD and PAD. Last seen by me in February 2019. She has a hsitory of  complex vascular surgery in November of 2012 including aorto-bifemoral bypass for aortic occlusive disease. She had CABG back in June of 2011. She has chronic swelling of her legs from venous insufficiency, pulmonary HTN,  and CKD.  Echo in Feb. 2014  showed a normal EF and moderate PHTN and grade 1 diastolic dysfunction.. She has a history of bradycardia.  She has refractory HTN.  She has a history of pulsatile tinnitus.  Carotid dopplers in August 2015 showed no obstructive disease. She underwent CT of the aorta which showed no obstructive disease in the aorta or great vessels. There was no aneurysm. Unfortunately she developed contrast induced  nephropathy with increased BUN to 85 and creatinine to 3.57. Her renal function later improved.   In September 2017 she underwent right THR. This was complicated by a wound infection that required debridement and wound VAC as well as antibiotics. She also has significant lumbar arthropathy. In February 2018 she was seen by hematology with complaints of fatigue and was severely anemic. Hgb 7.5. She was transfused 2 units PRBCs.   She is followed by hematology for chronic anemia. She sees Dr. Justin Mend for her CKD. This year she had AV fistula placed in her left arm. She is on Aranesp and iron. Last Hgb 10.1. She was diagnosed with Hepatitis C and has undergone treatment.Also diagnosed with kidney stones but no additional therapy planned.   She states she has had a rough year. She became quite anemic and required transfusion. She is getting Aranesp every 3 weeks. She said she got so bad she couldn't walk. Now able to walk with a rollator. One month ago she states she had a horrible pain in her left breast radiating to her right breast. She heard a "pop" or explosion and her breasts went flat. The pain lasted 1-2 minutes.   She states Dr Justin Mend increased her diuretic and the fluid is "coming out" but is painful. She denies any other chest pain or dyspnea. No palpitations. She is diabetic. Has a history of statin intolerance and is on Welchol.   The patient does not have symptoms concerning for COVID-19 infection (fever, chills, cough, or new shortness of breath).    Past  Medical History:  Diagnosis Date  . Anxiety   . Carotid artery occlusion   . CKD (chronic kidney disease) stage 3, GFR 30-59 ml/min (HCC) 04/18/2014  . Claudication ([place])   . Constipation    Due to patient taking iron supplements  . Coronary artery disease   . DDD (degenerative disc disease)   . Diabetes mellitus   . Diabetic coma ([place])   . DJD (degenerative joint disease)   . DJD (degenerative joint disease)   . Edema     Bilateral lower extermities  . Gout   . Hepatitis    Hep C, has been treated with "Maverick" - cured per pt  . Hyperlipidemia   . Hypertension   . Iron deficiency anemia   . Leg pain   . Obesity   . PAD (peripheral artery disease) ([place])   . Shortness of breath    exertion   Past Surgical History:  Procedure Laterality Date  . AORTA - BILATERAL FEMORAL ARTERY BYPASS GRAFT  05/25/2011   Procedure: AORTA BIFEMORAL BYPASS GRAFT;  Surgeon: Mal Misty, MD;  Location: [facility];  Service: Vascular;  Laterality: N/A;  . APPLICATION OF WOUND VAC Right 04/23/2016   Procedure: APPLICATION OF WOUND VAC CHANGED;  Surgeon: Meredith Pel, MD;  Location: [facility];  Service: Orthopedics;  Laterality: Right;  . AV FISTULA PLACEMENT Left 03/03/2018   Procedure: BRACHIOCEPHALIC ARTERIOVENOUS FISTULA CREATION LEFT ARM;  Surgeon: Elam Dutch, MD;  Location: [facility];  Service: Vascular;  Laterality: Left;  . BREAST EXCISIONAL BIOPSY Right   . BREAST SURGERY    . CARDIAC CATHETERIZATION  12/01/2009   EF 65%  . CARDIOVASCULAR STRESS TEST  11/28/2009   EF 75%  . COLONOSCOPY W/ POLYPECTOMY    . CORONARY ARTERY BYPASS GRAFT  12/08/2009   LIMA GRAFT TO THE DISTAL LAD AND SAPHENOUS VEIN GRAFT TO THE OBTUSE MARGINAL VESSEL  . EYE SURGERY    . FISTULA SUPERFICIALIZATION Left 07/28/2018   Procedure: LEFT ARM FISTULA SUPERFICIALIZATION;  Surgeon: Elam Dutch, MD;  Location: [facility];  Service: Vascular;  Laterality: Left;  . I&D EXTREMITY Right 04/23/2016   Procedure: IRRIGATION AND DEBRIDEMENT EXTREMITY;  Surgeon: Meredith Pel, MD;  Location: [facility];  Service: Orthopedics;  Laterality: Right;  . INCISION AND DRAINAGE HIP Right 04/20/2016   Procedure: IRRIGATION AND DEBRIDEMENT HIP WITH POLY EXCHANGE, ABX BEAD PLACEMENT, WOUND VAC ;  Surgeon: Meredith Pel, MD;  Location: [facility];  Service: Orthopedics;  Laterality: Right;  RIGHT HIP SUPERFICIAL I&D, POSSIBLE DEEP I&D, LINER EXCHANGE, ABX BEAD  PLACEMENT, WOUND VAC.   Marland Kitchen PR VEIN BYPASS GRAFT,AORTO-FEM-POP  05/25/11  . REMOVAL OF FIBROUS CYST FROM RIGHT BREAST  10+ YEARS  . RETINAL DETACHMENT SURGERY  10+ YEARS   LEFT EYE  . TOTAL HIP ARTHROPLASTY Right 03/06/2016  . TOTAL HIP ARTHROPLASTY Right 03/06/2016   Procedure: RIGHT TOTAL HIP ARTHROPLASTY ANTERIOR APPROACH;  Surgeon: Meredith Pel, MD;  Location: [facility];  Service: Orthopedics;  Laterality: Right;  . TRANSTHORACIC ECHOCARDIOGRAM  12/01/2009   EF 60-65%     Current Meds  Medication Sig  . acetaminophen (TYLENOL) 500 MG tablet Take 500 mg by mouth every 6 (six) hours as needed for moderate pain or headache.   . allopurinol (ZYLOPRIM) 100 MG tablet Take 100 mg by mouth daily as needed (for gout).   Marland Kitchen aspirin EC 81 MG tablet Take 81 mg by mouth daily.  . calcitRIOL (ROCALTROL) 0.5 MCG capsule  Take 0.5 mcg by mouth daily.  . calcium acetate (PHOSLO) 667 MG capsule Take 667 mg by mouth 3 (three) times daily before meals.   . carvedilol (COREG) 12.5 MG tablet Take 1 tablet (12.5 mg total) by mouth 2 (two) times daily.  . Cholecalciferol (VITAMIN D) 2000 units tablet Take 2,000 Units by mouth 2 (two) times a week.   . cloNIDine (CATAPRES) 0.3 MG tablet Take 0.3 mg by mouth 2 (two) times daily.   . colesevelam (WELCHOL) 625 MG tablet Take 1,875 mg by mouth 2 (two) times daily with a meal.   . Cyanocobalamin (VITAMIN B-12) 5000 MCG TBDP Take 5,000 mcg by mouth daily.   . ferrous sulfate 325 (65 FE) MG tablet Take 325 mg by mouth 2 (two) times daily.   . furosemide (LASIX) 80 MG tablet Take 80 mg by mouth 2 (two) times daily. Takes 2 tablets  ( 160 mg ) twice a day  . glucose blood (PRECISION QID TEST) test strip by Other route Three (3) times a day.  Marland Kitchen HYDROcodone-acetaminophen (NORCO/VICODIN) 5-325 MG tablet Take 1 tablet by mouth every 6 (six) hours as needed for moderate pain. (Patient taking differently: Take 1 tablet by mouth every 6 (six) hours as needed for severe pain. )   . insulin glargine (LANTUS) 100 UNIT/ML injection Inject 0.2 mLs (20 Units total) into the skin 2 (two) times daily. (Patient taking differently: Inject 20-24 Units into the skin See admin instructions. Inject 24 units SQ in the morning and inject 20 units SQ in the evening)  . isosorbide dinitrate (ISORDIL) 20 MG tablet Take 20 mg by mouth 2 (two) times daily.   . Multiple Vitamins-Minerals (MULTIVITAMIN PO) Take 1 tablet by mouth 2 (two) times a week.   Marland Kitchen omeprazole (PRILOSEC) 40 MG capsule Take 40 mg by mouth at bedtime.   . promethazine (PHENERGAN) 12.5 MG tablet Take 12.5 mg by mouth every 8 (eight) hours as needed for nausea or vomiting.   . Sennosides 25 MG TABS Take 25 mg by mouth every 3 (three) days.  . sodium bicarbonate 650 MG tablet Take 650 mg by mouth 2 (two) times daily.     Allergies:   Sulfasalazine; Iohexol; and Sulfa antibiotics   Social History   Tobacco Use  . Smoking status: Former Smoker    Years: 20.00    Types: Cigarettes    Last attempt to quit: 07/10/1991    Years since quitting: 27.4  . Smokeless tobacco: Never Used  Substance Use Topics  . Alcohol use: No  . Drug use: No     Family Hx: The patient's family history includes Cirrhosis in her brother; Coronary artery disease in her father; Hypertension in her mother and sister; Kidney disease in her daughter. There is no history of Breast cancer.  ROS:   Please see the history of present illness.    All other systems reviewed and are negative.   Prior CV studies:   The following studies were reviewed today:  None   Labs/Other Tests and Data Reviewed:    EKG:  No ECG reviewed.  Recent Labs: 10/09/2018: ALT <6; BUN 57; Creatinine 5.84; Potassium 3.9; Sodium 145 12/11/2018: Hemoglobin 9.6; Platelet Count 255  Dated 11/13/18: creatinine 5.23, BUN 97.   Recent Lipid Panel Lab Results  Component Value Date/Time   CHOL 138 04/30/2014 09:48 AM   TRIG 169 (H) 04/30/2014 09:48 AM   HDL 29 (L) 04/30/2014  09:48 AM   CHOLHDL 4.8 04/30/2014 09:48  AM   LDLCALC 75 04/30/2014 09:48 AM    Wt Readings from Last 3 Encounters:  12/15/18 181 lb (82.1 kg)  11/27/18 182 lb (82.6 kg)  08/28/18 181 lb 14.4 oz (82.5 kg)     Objective:    Vital Signs:  BP (!) 147/86   Ht 5\' 2"  (1.575 m)   Wt 181 lb (82.1 kg)   BMI 33.11 kg/m    VITAL SIGNS:  reviewed  ASSESSMENT & PLAN:    1. HTN - Chronic and resistant. On multiple medications. BP actually looks pretty good for her today.  Continue current therapy.   2. CAD - with past CABG 2011- asymptomatic. No plans for additional work up unless she has clear cut symptoms.   3. PVD - with past aortobifem bypass in 2012 - by Dr. Kellie Simmering. Stable claudication. Seen by [facility] for AV fistula. No recent dopplers.   4.  Severe aortic atherosclerosis. No obstructive disease or aneurysm.   5. Anemia. s/p transfusion.  Now on Aranesp and iron.  6. CKD stage 5- followed by Dr Justin Mend.  Avoid contrast studies in the future unless absolutely necessary.  7. Hypercholesterolemia. Last lipid panel I can find was Feb 2018 and LDL was 100. Given extensive vascular disease goal is < 70. Is scheduled for follow up with primary care. Would consider addition of a PCSK 9 inhibitor such as Repatha or bempidoic acid.     COVID-19 Education: The signs and symptoms of COVID-19 were discussed with the patient and how to seek care for testing (follow up with PCP or arrange E-visit).  The importance of social distancing was discussed today.  Time:   Today, I have spent 20 minutes with the patient with telehealth technology discussing the above problems.     Medication Adjustments/Labs and Tests Ordered: Current medicines are reviewed at length with the patient today.  Concerns regarding medicines are outlined above.   Tests Ordered: No orders of the defined types were placed in this encounter.   Medication Changes: No orders of the defined types were placed in this  encounter.   Disposition:  Follow up in 1 year(s)  Signed, Elleni Mozingo [place], MD  12/15/2018 10:30 AM    [facility]

## 2018-12-12 ENCOUNTER — Telehealth: Payer: Self-pay | Admitting: Internal Medicine

## 2018-12-12 NOTE — Telephone Encounter (Signed)
Returned call re rescheduling appointments. Per patient needs AM due to transportation. Moved 6/25 and 7/16 appointments to AM. Other appointments remain the same conformed with patient.

## 2018-12-15 ENCOUNTER — Telehealth (INDEPENDENT_AMBULATORY_CARE_PROVIDER_SITE_OTHER): Payer: Medicare Other | Admitting: Cardiology

## 2018-12-15 ENCOUNTER — Encounter: Payer: Self-pay | Admitting: Cardiology

## 2018-12-15 VITALS — BP 147/86 | Ht 62.0 in | Wt 181.0 lb

## 2018-12-15 DIAGNOSIS — I10 Essential (primary) hypertension: Secondary | ICD-10-CM

## 2018-12-15 DIAGNOSIS — I739 Peripheral vascular disease, unspecified: Secondary | ICD-10-CM

## 2018-12-15 DIAGNOSIS — I25708 Atherosclerosis of coronary artery bypass graft(s), unspecified, with other forms of angina pectoris: Secondary | ICD-10-CM | POA: Diagnosis not present

## 2018-12-15 DIAGNOSIS — Z95828 Presence of other vascular implants and grafts: Secondary | ICD-10-CM

## 2018-12-15 DIAGNOSIS — N185 Chronic kidney disease, stage 5: Secondary | ICD-10-CM

## 2018-12-15 DIAGNOSIS — E7800 Pure hypercholesterolemia, unspecified: Secondary | ICD-10-CM

## 2018-12-15 NOTE — Patient Instructions (Signed)
Medication Instructions:  Continue same medications If you need a refill on your cardiac medications before your next appointment, please call your pharmacy.   Lab work: None ordered   Testing/Procedures: None ordered  Follow-Up: At [facility], you and your health needs are our priority.  As part of our continuing mission to provide you with exceptional heart care, we have created designated Provider Care Teams.  These Care Teams include your primary Cardiologist (physician) and Advanced Practice Providers (APPs -  Physician Assistants and Nurse Practitioners) who all work together to provide you with the care you need, when you need it. . Schedule follow up appointment in 1 year     Call 3 months before to schedule

## 2018-12-19 ENCOUNTER — Ambulatory Visit: Payer: Medicare Other | Admitting: Cardiology

## 2018-12-24 ENCOUNTER — Ambulatory Visit: Payer: Medicare Other

## 2019-01-01 ENCOUNTER — Other Ambulatory Visit: Payer: Self-pay

## 2019-01-01 ENCOUNTER — Ambulatory Visit: Payer: Medicare Other

## 2019-01-01 ENCOUNTER — Inpatient Hospital Stay: Payer: Medicare Other

## 2019-01-01 ENCOUNTER — Other Ambulatory Visit: Payer: Medicare Other

## 2019-01-01 VITALS — BP 155/68 | HR 66 | Temp 98.1°F | Resp 18

## 2019-01-01 DIAGNOSIS — I129 Hypertensive chronic kidney disease with stage 1 through stage 4 chronic kidney disease, or unspecified chronic kidney disease: Secondary | ICD-10-CM | POA: Diagnosis not present

## 2019-01-01 DIAGNOSIS — N184 Chronic kidney disease, stage 4 (severe): Secondary | ICD-10-CM

## 2019-01-01 DIAGNOSIS — D638 Anemia in other chronic diseases classified elsewhere: Secondary | ICD-10-CM

## 2019-01-01 LAB — CBC WITH DIFFERENTIAL (CANCER CENTER ONLY)
Abs Immature Granulocytes: 0.01 10*3/uL (ref 0.00–0.07)
Basophils Absolute: 0 10*3/uL (ref 0.0–0.1)
Basophils Relative: 1 %
Eosinophils Absolute: 0.2 10*3/uL (ref 0.0–0.5)
Eosinophils Relative: 5 %
HCT: 31 % — ABNORMAL LOW (ref 36.0–46.0)
Hemoglobin: 9.1 g/dL — ABNORMAL LOW (ref 12.0–15.0)
Immature Granulocytes: 0 %
Lymphocytes Relative: 17 %
Lymphs Abs: 0.6 10*3/uL — ABNORMAL LOW (ref 0.7–4.0)
MCH: 28.6 pg (ref 26.0–34.0)
MCHC: 29.4 g/dL — ABNORMAL LOW (ref 30.0–36.0)
MCV: 97.5 fL (ref 80.0–100.0)
Monocytes Absolute: 0.4 10*3/uL (ref 0.1–1.0)
Monocytes Relative: 12 %
Neutro Abs: 2.2 10*3/uL (ref 1.7–7.7)
Neutrophils Relative %: 65 %
Platelet Count: 191 10*3/uL (ref 150–400)
RBC: 3.18 MIL/uL — ABNORMAL LOW (ref 3.87–5.11)
RDW: 19.9 % — ABNORMAL HIGH (ref 11.5–15.5)
WBC Count: 3.4 10*3/uL — ABNORMAL LOW (ref 4.0–10.5)
nRBC: 0 % (ref 0.0–0.2)

## 2019-01-01 MED ORDER — DARBEPOETIN ALFA 300 MCG/0.6ML IJ SOSY
300.0000 ug | PREFILLED_SYRINGE | Freq: Once | INTRAMUSCULAR | Status: AC
  Administered 2019-01-01: 300 ug via SUBCUTANEOUS

## 2019-01-01 MED ORDER — DARBEPOETIN ALFA 300 MCG/0.6ML IJ SOSY
PREFILLED_SYRINGE | INTRAMUSCULAR | Status: AC
  Filled 2019-01-01: qty 0.6

## 2019-01-01 NOTE — Patient Instructions (Signed)

## 2019-01-22 ENCOUNTER — Other Ambulatory Visit: Payer: Self-pay

## 2019-01-22 ENCOUNTER — Inpatient Hospital Stay: Payer: Medicare Other

## 2019-01-22 ENCOUNTER — Inpatient Hospital Stay: Payer: Medicare Other | Attending: Internal Medicine

## 2019-01-22 ENCOUNTER — Other Ambulatory Visit: Payer: Medicare Other

## 2019-01-22 ENCOUNTER — Ambulatory Visit: Payer: Medicare Other

## 2019-01-22 VITALS — BP 149/41 | HR 56 | Temp 98.7°F | Resp 18

## 2019-01-22 DIAGNOSIS — D638 Anemia in other chronic diseases classified elsewhere: Secondary | ICD-10-CM

## 2019-01-22 DIAGNOSIS — N184 Chronic kidney disease, stage 4 (severe): Secondary | ICD-10-CM

## 2019-01-22 DIAGNOSIS — D631 Anemia in chronic kidney disease: Secondary | ICD-10-CM | POA: Insufficient documentation

## 2019-01-22 LAB — CBC WITH DIFFERENTIAL (CANCER CENTER ONLY)
Abs Immature Granulocytes: 0.02 10*3/uL (ref 0.00–0.07)
Basophils Absolute: 0 10*3/uL (ref 0.0–0.1)
Basophils Relative: 0 %
Eosinophils Absolute: 0.1 10*3/uL (ref 0.0–0.5)
Eosinophils Relative: 1 %
HCT: 31.3 % — ABNORMAL LOW (ref 36.0–46.0)
Hemoglobin: 9.4 g/dL — ABNORMAL LOW (ref 12.0–15.0)
Immature Granulocytes: 0 %
Lymphocytes Relative: 12 %
Lymphs Abs: 0.6 10*3/uL — ABNORMAL LOW (ref 0.7–4.0)
MCH: 28.9 pg (ref 26.0–34.0)
MCHC: 30 g/dL (ref 30.0–36.0)
MCV: 96.3 fL (ref 80.0–100.0)
Monocytes Absolute: 0.4 10*3/uL (ref 0.1–1.0)
Monocytes Relative: 8 %
Neutro Abs: 4 10*3/uL (ref 1.7–7.7)
Neutrophils Relative %: 79 %
Platelet Count: 274 10*3/uL (ref 150–400)
RBC: 3.25 MIL/uL — ABNORMAL LOW (ref 3.87–5.11)
RDW: 18 % — ABNORMAL HIGH (ref 11.5–15.5)
WBC Count: 5.1 10*3/uL (ref 4.0–10.5)
nRBC: 0 % (ref 0.0–0.2)

## 2019-01-22 MED ORDER — DARBEPOETIN ALFA 300 MCG/0.6ML IJ SOSY
300.0000 ug | PREFILLED_SYRINGE | Freq: Once | INTRAMUSCULAR | Status: AC
  Administered 2019-01-22: 300 ug via SUBCUTANEOUS

## 2019-01-22 MED ORDER — DARBEPOETIN ALFA 300 MCG/0.6ML IJ SOSY
PREFILLED_SYRINGE | INTRAMUSCULAR | Status: AC
  Filled 2019-01-22: qty 0.6

## 2019-01-22 NOTE — Patient Instructions (Signed)
Darbepoetin Alfa injection What is this medicine? DARBEPOETIN ALFA (dar be POE e tin AL fa) helps your body make more red blood cells. It is used to treat anemia caused by chronic kidney failure and chemotherapy. This medicine may be used for other purposes; ask your health care provider or pharmacist if you have questions. COMMON BRAND NAME(S): Aranesp What should I tell my health care provider before I take this medicine? They need to know if you have any of these conditions:  blood clotting disorders or history of blood clots  cancer patient not on chemotherapy  cystic fibrosis  heart disease, such as angina, heart failure, or a history of a heart attack  hemoglobin level of 12 g/dL or greater  high blood pressure  low levels of folate, iron, or vitamin B12  seizures  an unusual or allergic reaction to darbepoetin, erythropoietin, albumin, hamster proteins, latex, other medicines, foods, dyes, or preservatives  pregnant or trying to get pregnant  breast-feeding How should I use this medicine? This medicine is for injection into a vein or under the skin. It is usually given by a health care professional in a hospital or clinic setting. If you get this medicine at home, you will be taught how to prepare and give this medicine. Use exactly as directed. Take your medicine at regular intervals. Do not take your medicine more often than directed. It is important that you put your used needles and syringes in a special sharps container. Do not put them in a trash can. If you do not have a sharps container, call your pharmacist or healthcare provider to get one. A special MedGuide will be given to you by the pharmacist with each prescription and refill. Be sure to read this information carefully each time. Talk to your pediatrician regarding the use of this medicine in children. While this medicine may be used in children as young as 1 month of age for selected conditions, precautions do  apply. Overdosage: If you think you have taken too much of this medicine contact a poison control center or emergency room at once. NOTE: This medicine is only for you. Do not share this medicine with others. What if I miss a dose? If you miss a dose, take it as soon as you can. If it is almost time for your next dose, take only that dose. Do not take double or extra doses. What may interact with this medicine? Do not take this medicine with any of the following medications:  epoetin alfa This list may not describe all possible interactions. Give your health care provider a list of all the medicines, herbs, non-prescription drugs, or dietary supplements you use. Also tell them if you smoke, drink alcohol, or use illegal drugs. Some items may interact with your medicine. What should I watch for while using this medicine? Your condition will be monitored carefully while you are receiving this medicine. You may need blood work done while you are taking this medicine. This medicine may cause a decrease in vitamin B6. You should make sure that you get enough vitamin B6 while you are taking this medicine. Discuss the foods you eat and the vitamins you take with your health care professional. What side effects may I notice from receiving this medicine? Side effects that you should report to your doctor or health care professional as soon as possible:  allergic reactions like skin rash, itching or hives, swelling of the face, lips, or tongue  breathing problems  changes in   vision  chest pain  confusion, trouble speaking or understanding  feeling faint or lightheaded, falls  high blood pressure  muscle aches or pains  pain, swelling, warmth in the leg  rapid weight gain  severe headaches  sudden numbness or weakness of the face, arm or leg  trouble walking, dizziness, loss of balance or coordination  seizures (convulsions)  swelling of the ankles, feet, hands  unusually weak or  tired Side effects that usually do not require medical attention (report to your doctor or health care professional if they continue or are bothersome):  diarrhea  fever, chills (flu-like symptoms)  headaches  nausea, vomiting  redness, stinging, or swelling at site where injected This list may not describe all possible side effects. Call your doctor for medical advice about side effects. You may report side effects to FDA at 1-800-FDA-1088. Where should I keep my medicine? Keep out of the reach of children. Store in a refrigerator between 2 and 8 degrees C (36 and 46 degrees F). Do not freeze. Do not shake. Throw away any unused portion if using a single-dose vial. Throw away any unused medicine after the expiration date. NOTE: This sheet is a summary. It may not cover all possible information. If you have questions about this medicine, talk to your doctor, pharmacist, or health care provider.  2020 Elsevier/Gold Standard (2017-07-10 16:44:20)  

## 2019-01-23 ENCOUNTER — Inpatient Hospital Stay (HOSPITAL_COMMUNITY)
Admission: RE | Admit: 2019-01-23 | Discharge: 2019-01-23 | Disposition: A | Discharge status: Home / Self Care | Payer: Medicare Other | Source: Ambulatory Visit | Attending: Nephrology | Admitting: Nephrology

## 2019-01-27 ENCOUNTER — Ambulatory Visit: Payer: Medicare Other

## 2019-02-06 ENCOUNTER — Encounter (HOSPITAL_COMMUNITY): Payer: Medicare Other

## 2019-02-12 ENCOUNTER — Other Ambulatory Visit: Payer: Self-pay

## 2019-02-12 ENCOUNTER — Other Ambulatory Visit: Payer: Medicare Other

## 2019-02-12 ENCOUNTER — Inpatient Hospital Stay: Payer: Medicare Other

## 2019-02-12 ENCOUNTER — Ambulatory Visit: Payer: Medicare Other | Admitting: Internal Medicine

## 2019-02-12 ENCOUNTER — Inpatient Hospital Stay (HOSPITAL_BASED_OUTPATIENT_CLINIC_OR_DEPARTMENT_OTHER): Payer: Medicare Other | Admitting: Physician Assistant

## 2019-02-12 ENCOUNTER — Inpatient Hospital Stay: Payer: Medicare Other | Attending: Internal Medicine

## 2019-02-12 ENCOUNTER — Other Ambulatory Visit: Payer: Self-pay | Admitting: *Deleted

## 2019-02-12 ENCOUNTER — Ambulatory Visit: Payer: Medicare Other

## 2019-02-12 ENCOUNTER — Encounter: Payer: Self-pay | Admitting: Physician Assistant

## 2019-02-12 VITALS — BP 155/52 | HR 82 | Temp 98.3°F | Resp 18

## 2019-02-12 VITALS — BP 161/49 | HR 81 | Temp 98.3°F | Resp 20 | Ht 62.0 in

## 2019-02-12 DIAGNOSIS — F419 Anxiety disorder, unspecified: Secondary | ICD-10-CM | POA: Insufficient documentation

## 2019-02-12 DIAGNOSIS — B1920 Unspecified viral hepatitis C without hepatic coma: Secondary | ICD-10-CM | POA: Insufficient documentation

## 2019-02-12 DIAGNOSIS — E876 Hypokalemia: Secondary | ICD-10-CM | POA: Diagnosis not present

## 2019-02-12 DIAGNOSIS — D631 Anemia in chronic kidney disease: Secondary | ICD-10-CM | POA: Diagnosis not present

## 2019-02-12 DIAGNOSIS — Z951 Presence of aortocoronary bypass graft: Secondary | ICD-10-CM | POA: Diagnosis not present

## 2019-02-12 DIAGNOSIS — E785 Hyperlipidemia, unspecified: Secondary | ICD-10-CM | POA: Insufficient documentation

## 2019-02-12 DIAGNOSIS — E1122 Type 2 diabetes mellitus with diabetic chronic kidney disease: Secondary | ICD-10-CM | POA: Insufficient documentation

## 2019-02-12 DIAGNOSIS — I120 Hypertensive chronic kidney disease with stage 5 chronic kidney disease or end stage renal disease: Secondary | ICD-10-CM | POA: Insufficient documentation

## 2019-02-12 DIAGNOSIS — M1990 Unspecified osteoarthritis, unspecified site: Secondary | ICD-10-CM | POA: Insufficient documentation

## 2019-02-12 DIAGNOSIS — N186 End stage renal disease: Secondary | ICD-10-CM | POA: Diagnosis not present

## 2019-02-12 DIAGNOSIS — M109 Gout, unspecified: Secondary | ICD-10-CM | POA: Insufficient documentation

## 2019-02-12 DIAGNOSIS — N184 Chronic kidney disease, stage 4 (severe): Secondary | ICD-10-CM

## 2019-02-12 DIAGNOSIS — Z79899 Other long term (current) drug therapy: Secondary | ICD-10-CM | POA: Diagnosis not present

## 2019-02-12 DIAGNOSIS — E669 Obesity, unspecified: Secondary | ICD-10-CM | POA: Insufficient documentation

## 2019-02-12 DIAGNOSIS — D638 Anemia in other chronic diseases classified elsewhere: Secondary | ICD-10-CM | POA: Diagnosis not present

## 2019-02-12 DIAGNOSIS — I2510 Atherosclerotic heart disease of native coronary artery without angina pectoris: Secondary | ICD-10-CM | POA: Insufficient documentation

## 2019-02-12 DIAGNOSIS — Z794 Long term (current) use of insulin: Secondary | ICD-10-CM | POA: Diagnosis not present

## 2019-02-12 DIAGNOSIS — Z7982 Long term (current) use of aspirin: Secondary | ICD-10-CM | POA: Insufficient documentation

## 2019-02-12 LAB — CBC WITH DIFFERENTIAL (CANCER CENTER ONLY)
Abs Immature Granulocytes: 0.05 10*3/uL (ref 0.00–0.07)
Basophils Absolute: 0 10*3/uL (ref 0.0–0.1)
Basophils Relative: 0 %
Eosinophils Absolute: 0 10*3/uL (ref 0.0–0.5)
Eosinophils Relative: 0 %
HCT: 30.3 % — ABNORMAL LOW (ref 36.0–46.0)
Hemoglobin: 9.2 g/dL — ABNORMAL LOW (ref 12.0–15.0)
Immature Granulocytes: 1 %
Lymphocytes Relative: 4 %
Lymphs Abs: 0.3 10*3/uL — ABNORMAL LOW (ref 0.7–4.0)
MCH: 29.4 pg (ref 26.0–34.0)
MCHC: 30.4 g/dL (ref 30.0–36.0)
MCV: 96.8 fL (ref 80.0–100.0)
Monocytes Absolute: 0.1 10*3/uL (ref 0.1–1.0)
Monocytes Relative: 2 %
Neutro Abs: 7.5 10*3/uL (ref 1.7–7.7)
Neutrophils Relative %: 93 %
Platelet Count: 195 10*3/uL (ref 150–400)
RBC: 3.13 MIL/uL — ABNORMAL LOW (ref 3.87–5.11)
RDW: 19.1 % — ABNORMAL HIGH (ref 11.5–15.5)
WBC Count: 8 10*3/uL (ref 4.0–10.5)
nRBC: 0 % (ref 0.0–0.2)

## 2019-02-12 MED ORDER — DARBEPOETIN ALFA 300 MCG/0.6ML IJ SOSY
PREFILLED_SYRINGE | INTRAMUSCULAR | Status: AC
  Filled 2019-02-12: qty 0.6

## 2019-02-12 MED ORDER — DARBEPOETIN ALFA 300 MCG/0.6ML IJ SOSY
300.0000 ug | PREFILLED_SYRINGE | Freq: Once | INTRAMUSCULAR | Status: AC
  Administered 2019-02-12: 300 ug via SUBCUTANEOUS

## 2019-02-12 NOTE — Patient Instructions (Signed)

## 2019-02-12 NOTE — Progress Notes (Signed)
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 First dose 03/06/2017. Over-the-counter ferrous sulfate  INTERVAL HISTORY: Norma [name] 73 y.o. female returns to the clinic for a follow up visit. The patient is feeling well today without any concerning complaints except for fatigue secondary to her anemia. The patient has anemia of chronic disease secondary to her chronic kidney disease.The patient is followed closely by Dr. Justin Mend from [facility]. She states that she should be hearing from their office soon regarding a follow up appointment. Otherwise the patient denies any recent fevers, chills, night sweats, or weight loss. She denies any chest pain, shortness of breath, cough, palpitations, or hemoptysis. She continues to tolerate her treatment with Aranesp well without any adverse side effects. The patient continues to take supplemental iron tablets OTC. The patient is here today for evaluation and repeat blood work.  MEDICAL HISTORY: Past Medical History:  Diagnosis Date  . Anxiety   . Carotid artery occlusion   . CKD (chronic kidney disease) stage 3, GFR 30-59 ml/min (HCC) 04/18/2014  . Claudication ([place])   . Constipation    Due to patient taking iron supplements  . Coronary artery disease   . DDD (degenerative disc disease)   . Diabetes mellitus   . Diabetic coma ([place])   . DJD (degenerative joint disease)   . DJD (degenerative joint disease)   . Edema    Bilateral lower extermities  . Gout   . Hepatitis    Hep C, has been treated with "[place]" - cured per pt  . Hyperlipidemia   . Hypertension   . Iron deficiency anemia   . Leg pain   . Obesity   . PAD (peripheral artery disease) ([place])   . Shortness of breath    exertion     ALLERGIES:  is allergic to sulfasalazine; iohexol; and sulfa antibiotics.  MEDICATIONS:  Current Outpatient Medications  Medication Sig Dispense Refill  . acetaminophen (TYLENOL) 500 MG tablet Take 500 mg by mouth every 6 (six) hours as needed for moderate pain or headache.     . allopurinol (ZYLOPRIM) 100 MG tablet Take 100 mg by mouth daily as needed (for gout).     Marland Kitchen aspirin EC 81 MG tablet Take 81 mg by mouth daily.    . calcitRIOL (ROCALTROL) 0.5 MCG capsule Take 0.5 mcg by mouth daily.    . calcium acetate (PHOSLO) 667 MG capsule Take 667 mg by mouth 3 (three) times daily before meals.   6  . carvedilol (COREG) 12.5 MG tablet Take 1 tablet (12.5 mg total) by mouth 2 (two) times daily. (Patient taking differently: Take 12.5 mg by mouth daily. ) 180 tablet 3  . Cholecalciferol (VITAMIN D) 2000 units tablet Take 2,000 Units by mouth 2 (two) times a week.     . cloNIDine (CATAPRES) 0.3 MG tablet Take 0.3 mg by mouth daily.     . colesevelam (WELCHOL) 625 MG tablet Take 1,875 mg by mouth 2 (two) times daily with a meal.     . Cyanocobalamin (VITAMIN B-12) 5000 MCG TBDP Take 5,000 mcg by mouth daily.     . ferrous sulfate 325 (65 FE) MG tablet Take 325 mg by mouth 2 (two) times daily.     . furosemide (LASIX)  80 MG tablet Take 80 mg by mouth 2 (two) times daily. Takes 2 tablets  ( 160 mg ) twice a day    . glucose blood (PRECISION QID TEST) test strip by Other route Three (3) times a day.    Marland Kitchen HYDROcodone-acetaminophen (NORCO/VICODIN) 5-325 MG tablet Take 1 tablet by mouth every 6 (six) hours as needed for moderate pain. (Patient taking differently: Take 1 tablet by mouth every 6 (six) hours as needed for severe pain. ) 6 tablet 0  . insulin glargine (LANTUS) 100 UNIT/ML injection Inject 0.2 mLs (20 Units total) into the skin 2 (two) times daily. (Patient taking differently: Inject 20-24 Units into the skin See admin instructions. Inject 24 units SQ in the morning and inject 20 units SQ in  the evening) 10 mL 11  . Multiple Vitamins-Minerals (MULTIVITAMIN PO) Take 1 tablet by mouth 2 (two) times a week.     Marland Kitchen omeprazole (PRILOSEC) 40 MG capsule Take 40 mg by mouth at bedtime.     . promethazine (PHENERGAN) 12.5 MG tablet Take 12.5 mg by mouth every 8 (eight) hours as needed for nausea or vomiting.     . Sennosides 25 MG TABS Take 25 mg by mouth every 3 (three) days.    . sodium bicarbonate 650 MG tablet Take 650 mg by mouth 2 (two) times daily.    . isosorbide dinitrate (ISORDIL) 20 MG tablet Take 20 mg by mouth 2 (two) times daily.      No current facility-administered medications for this visit.     SURGICAL HISTORY:  Past Surgical History:  Procedure Laterality Date  . AORTA - BILATERAL FEMORAL ARTERY BYPASS GRAFT  05/25/2011   Procedure: AORTA BIFEMORAL BYPASS GRAFT;  Surgeon: Mal Misty, MD;  Location: [facility];  Service: Vascular;  Laterality: N/A;  . APPLICATION OF WOUND VAC Right 04/23/2016   Procedure: APPLICATION OF WOUND VAC CHANGED;  Surgeon: Meredith Pel, MD;  Location: [facility];  Service: Orthopedics;  Laterality: Right;  . AV FISTULA PLACEMENT Left 03/03/2018   Procedure: BRACHIOCEPHALIC ARTERIOVENOUS FISTULA CREATION LEFT ARM;  Surgeon: Elam Dutch, MD;  Location: [facility];  Service: Vascular;  Laterality: Left;  . BREAST EXCISIONAL BIOPSY Right   . BREAST SURGERY    . CARDIAC CATHETERIZATION  12/01/2009   EF 65%  . CARDIOVASCULAR STRESS TEST  11/28/2009   EF 75%  . COLONOSCOPY W/ POLYPECTOMY    . CORONARY ARTERY BYPASS GRAFT  12/08/2009   LIMA GRAFT TO THE DISTAL LAD AND SAPHENOUS VEIN GRAFT TO THE OBTUSE MARGINAL VESSEL  . EYE SURGERY    . FISTULA SUPERFICIALIZATION Left 07/28/2018   Procedure: LEFT ARM FISTULA SUPERFICIALIZATION;  Surgeon: Elam Dutch, MD;  Location: [facility];  Service: Vascular;  Laterality: Left;  . I&D EXTREMITY Right 04/23/2016   Procedure: IRRIGATION AND DEBRIDEMENT EXTREMITY;  Surgeon: Meredith Pel, MD;  Location: [facility];  Service: Orthopedics;  Laterality: Right;  . INCISION AND DRAINAGE HIP Right 04/20/2016   Procedure: IRRIGATION AND DEBRIDEMENT HIP WITH POLY EXCHANGE, ABX BEAD PLACEMENT, WOUND VAC ;  Surgeon: Meredith Pel, MD;  Location: [facility];  Service: Orthopedics;  Laterality: Right;  RIGHT HIP SUPERFICIAL I&D, POSSIBLE DEEP I&D, LINER EXCHANGE, ABX BEAD PLACEMENT, WOUND VAC.   Marland Kitchen PR VEIN BYPASS GRAFT,AORTO-FEM-POP  05/25/11  . REMOVAL OF FIBROUS CYST FROM RIGHT BREAST  10+ YEARS  . RETINAL DETACHMENT SURGERY  10+ YEARS   LEFT EYE  . TOTAL HIP ARTHROPLASTY Right  03/06/2016  . TOTAL HIP ARTHROPLASTY Right 03/06/2016   Procedure: RIGHT TOTAL HIP ARTHROPLASTY ANTERIOR APPROACH;  Surgeon: Meredith Pel, MD;  Location: [facility];  Service: Orthopedics;  Laterality: Right;  . TRANSTHORACIC ECHOCARDIOGRAM  12/01/2009   EF 60-65%    REVIEW OF SYSTEMS:   Review of Systems  Constitutional: Positive for fatigue. Negative for appetite change, chills, fever and unexpected weight change.  HENT:   Negative for mouth sores, nosebleeds, sore throat and trouble swallowing.   Eyes: Negative for eye problems and icterus.  Respiratory: Negative for cough, hemoptysis, shortness of breath and wheezing.   Cardiovascular: Positive for bilateral lower extremity swelling. Negative for chest pain. Gastrointestinal: Negative for abdominal pain, constipation, diarrhea, nausea and vomiting.  Genitourinary: Negative for bladder incontinence, difficulty urinating, dysuria, frequency and hematuria.   Musculoskeletal: Negative for back pain, gait problem, neck pain and neck stiffness.  Skin: Negative for itching and rash.  Neurological: Negative for dizziness, extremity weakness, gait problem, headaches, light-headedness and seizures.  Hematological: Negative for adenopathy. Does not bruise/bleed easily.  Psychiatric/Behavioral: Negative for confusion, depression and sleep disturbance. The patient is not nervous/anxious.      PHYSICAL EXAMINATION:  Blood pressure (!) 161/49, pulse 81, temperature 98.3 F (36.8 C), temperature source Oral, resp. rate 20, height 5\' 2"  (1.575 m), SpO2 100 %.  ECOG PERFORMANCE STATUS: 1 - Symptomatic but completely ambulatory  Physical Exam  Constitutional: Oriented to person, place, and time and chronically-ill appearing female and in no distress. She was examined in a wheelchair. HENT:  Head: Normocephalic and atraumatic.  Mouth/Throat: Oropharynx is clear. No oropharyngeal exudate.  Eyes: Conjunctivae are normal. Right eye exhibits no discharge. Left eye exhibits no discharge. No scleral icterus.  Neck: Normal range of motion. Neck supple.  Cardiovascular: Normal rate, regular rhythm, normal heart sounds and intact distal pulses.   Pulmonary/Chest: Effort normal and breath sounds normal. No respiratory distress. No wheezes. No rales.  Abdominal: Soft. Bowel sounds are normal. Exhibits no distension and no mass. There is no tenderness.  Musculoskeletal: Bilateral lower extremity edema. Normal range of motion.  Lymphadenopathy:    No cervical adenopathy.  Neurological: Alert and oriented to person, place, and time. Exhibits normal muscle tone. Gait normal. Coordination normal.  Skin: Skin is warm and dry. No rash noted. Not diaphoretic. No erythema. No pallor.  Psychiatric: Mood, memory and judgment normal.  Vitals reviewed.  LABORATORY DATA: Lab Results  Component Value Date   WBC 8.0 02/12/2019   HGB 9.2 (L) 02/12/2019   HCT 30.3 (L) 02/12/2019   MCV 96.8 02/12/2019   PLT 195 02/12/2019      Chemistry      Component Value Date/Time   NA 145 10/09/2018 1054   NA 143 06/28/2017 1239   K 3.9 10/09/2018 1054   K 4.8 06/28/2017 1239   CL 107 10/09/2018 1054   CO2 25 10/09/2018 1054   CO2 22 06/28/2017 1239   BUN 57 (H) 10/09/2018 1054   BUN 48.6 (H) 06/28/2017 1239   CREATININE 5.84 (HH) 10/09/2018 1054   CREATININE 2.9 (H) 06/28/2017 1239      Component  Value Date/Time   CALCIUM 8.8 (L) 10/09/2018 1054   CALCIUM 8.3 (L) 06/28/2017 1239   ALKPHOS 57 10/09/2018 1054   ALKPHOS 86 06/28/2017 1239   AST 9 (L) 10/09/2018 1054   AST 11 06/28/2017 1239   ALT <6 10/09/2018 1054   ALT <6 06/28/2017 1239   BILITOT 1.1 10/09/2018 1054   BILITOT  0.28 06/28/2017 1239       RADIOGRAPHIC STUDIES:  No results found.   ASSESSMENT/PLAN:  This is a very pleasant 73 year old African-American female diagnosed with anemia of chronic disease secondary to chronic kidney disease +/-deficiency anemia.  The patient is currently undergoing treatment with Aranesp 300 mcg every 3 weeks in addition to over-the-counter iron supplements.   The patient has been tolerating her treatment well without any adverse side effects.  The patient was seen with Dr. Julien Nordmann today.  Labs were reviewed with the patient.  Her CBC today shows stable hemoglobin and hematocrit.  Her iron studies are pending.  Dr. Julien Nordmann recommends that the patient continue on her current treatment with Aranesp the same dose.  We will see the patient back for follow-up visit in 3 months for evaluation with a repeat CBC, iron study, and ferritin.  The patient's blood pressure was noted to be elevated on exam today.  The patient states that her dose of clonidine was recently reduced.  The patient also states that she was taken off hydralazine. She is planning on following up with her prescribing physician soon.  The patient was encouraged to monitor her blood pressure at home and record her readings and bring this information to her follow-up office visit for her hypertension.  The patient will continue to be followed closely by Dr. Justin Mend for her history of chronic kidney disease.  I provided patient handout of iron rich foods and foods low potassium as well as phosphorus at the patient's request.   The patient was advised to call immediately if she has any concerning symptoms in the interval. The  patient voices understanding of current disease status and treatment options and is in agreement with the current care plan. All questions were answered. The patient knows to call the clinic with any problems, questions or concerns. We can certainly see the patient much sooner if necessary  Orders Placed This Encounter  Procedures  . CBC with Differential (Cancer Center Only)    Standing Status:   Standing    Number of Occurrences:   12    Standing Expiration Date:   02/12/2020  . Ferritin    Standing Status:   Future    Standing Expiration Date:   02/12/2020  . Iron and TIBC    Standing Status:   Future    Standing Expiration Date:   02/12/2020     Child Campoy L Valton Schwartz, PA-C 02/12/19  ADDENDUM: Hematology/Oncology Attending: I had a face-to-face encounter with the patient today.  I recommended her care plan.  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  The patient is followed by Dr. Justin Mend from nephrology but has not started hemodialysis yet.  Her creatinine has been very elevated and her GFR is less than 10%. She is currently on treatment with Aranesp 300 mcg subcutaneously every 3 weeks in addition to oral iron tablets.  Her hemoglobin and hematocrit are low but stable. We will check her iron studies today. I recommended for the patient to continue her current treatment with Aranesp with the same dose. She will come back for follow-up visit in 3 months for evaluation with repeat CBC, iron study and ferritin. She was advised to call immediately if she has any concerning symptoms in the interval.  Disclaimer: This note was dictated with voice recognition software. Similar sounding words can inadvertently be transcribed and may be missed upon review. Eilleen Kempf,  MD 02/12/19

## 2019-02-13 LAB — IRON AND TIBC
Iron: 26 ug/dL — ABNORMAL LOW (ref 41–142)
Saturation Ratios: 15 % — ABNORMAL LOW (ref 21–57)
TIBC: 168 ug/dL — ABNORMAL LOW (ref 236–444)
UIBC: 142 ug/dL (ref 120–384)

## 2019-02-13 LAB — FERRITIN: Ferritin: 516 ng/mL — ABNORMAL HIGH (ref 11–307)

## 2019-02-16 ENCOUNTER — Telehealth: Payer: Self-pay | Admitting: Internal Medicine

## 2019-02-16 NOTE — Telephone Encounter (Signed)
Scheduled appt per 8/06 los - pt to get an updated schedule next visit. 

## 2019-02-19 ENCOUNTER — Other Ambulatory Visit: Payer: Self-pay

## 2019-02-19 ENCOUNTER — Emergency Department (HOSPITAL_COMMUNITY): Payer: Medicare Other

## 2019-02-19 ENCOUNTER — Emergency Department (HOSPITAL_COMMUNITY)
Admission: EM | Admit: 2019-02-19 | Discharge: 2019-02-19 | Disposition: A | Discharge status: Home / Self Care | Payer: Medicare Other | Attending: Emergency Medicine | Admitting: Emergency Medicine

## 2019-02-19 ENCOUNTER — Encounter (HOSPITAL_COMMUNITY): Payer: Self-pay | Admitting: Emergency Medicine

## 2019-02-19 DIAGNOSIS — Z951 Presence of aortocoronary bypass graft: Secondary | ICD-10-CM | POA: Insufficient documentation

## 2019-02-19 DIAGNOSIS — Z79899 Other long term (current) drug therapy: Secondary | ICD-10-CM | POA: Insufficient documentation

## 2019-02-19 DIAGNOSIS — N184 Chronic kidney disease, stage 4 (severe): Secondary | ICD-10-CM | POA: Insufficient documentation

## 2019-02-19 DIAGNOSIS — Z87891 Personal history of nicotine dependence: Secondary | ICD-10-CM | POA: Insufficient documentation

## 2019-02-19 DIAGNOSIS — Z96641 Presence of right artificial hip joint: Secondary | ICD-10-CM | POA: Diagnosis not present

## 2019-02-19 DIAGNOSIS — Y999 Unspecified external cause status: Secondary | ICD-10-CM | POA: Insufficient documentation

## 2019-02-19 DIAGNOSIS — L89219 Pressure ulcer of right hip, unspecified stage: Secondary | ICD-10-CM | POA: Diagnosis not present

## 2019-02-19 DIAGNOSIS — S8991XA Unspecified injury of right lower leg, initial encounter: Secondary | ICD-10-CM | POA: Diagnosis present

## 2019-02-19 DIAGNOSIS — Z7982 Long term (current) use of aspirin: Secondary | ICD-10-CM | POA: Insufficient documentation

## 2019-02-19 DIAGNOSIS — X58XXXA Exposure to other specified factors, initial encounter: Secondary | ICD-10-CM | POA: Diagnosis not present

## 2019-02-19 DIAGNOSIS — Y939 Activity, unspecified: Secondary | ICD-10-CM | POA: Diagnosis not present

## 2019-02-19 DIAGNOSIS — Z96651 Presence of right artificial knee joint: Secondary | ICD-10-CM | POA: Diagnosis not present

## 2019-02-19 DIAGNOSIS — E1122 Type 2 diabetes mellitus with diabetic chronic kidney disease: Secondary | ICD-10-CM | POA: Insufficient documentation

## 2019-02-19 DIAGNOSIS — S81801A Unspecified open wound, right lower leg, initial encounter: Secondary | ICD-10-CM | POA: Insufficient documentation

## 2019-02-19 DIAGNOSIS — I2510 Atherosclerotic heart disease of native coronary artery without angina pectoris: Secondary | ICD-10-CM | POA: Insufficient documentation

## 2019-02-19 DIAGNOSIS — R2243 Localized swelling, mass and lump, lower limb, bilateral: Secondary | ICD-10-CM | POA: Diagnosis not present

## 2019-02-19 DIAGNOSIS — L89209 Pressure ulcer of unspecified hip, unspecified stage: Secondary | ICD-10-CM

## 2019-02-19 DIAGNOSIS — L89229 Pressure ulcer of left hip, unspecified stage: Secondary | ICD-10-CM | POA: Insufficient documentation

## 2019-02-19 DIAGNOSIS — Z794 Long term (current) use of insulin: Secondary | ICD-10-CM | POA: Insufficient documentation

## 2019-02-19 DIAGNOSIS — I129 Hypertensive chronic kidney disease with stage 1 through stage 4 chronic kidney disease, or unspecified chronic kidney disease: Secondary | ICD-10-CM | POA: Diagnosis not present

## 2019-02-19 DIAGNOSIS — Y929 Unspecified place or not applicable: Secondary | ICD-10-CM | POA: Insufficient documentation

## 2019-02-19 LAB — CBC WITH DIFFERENTIAL/PLATELET
Abs Immature Granulocytes: 0.07 10*3/uL (ref 0.00–0.07)
Basophils Absolute: 0 10*3/uL (ref 0.0–0.1)
Basophils Relative: 0 %
Eosinophils Absolute: 0.1 10*3/uL (ref 0.0–0.5)
Eosinophils Relative: 1 %
HCT: 29.2 % — ABNORMAL LOW (ref 36.0–46.0)
Hemoglobin: 8.6 g/dL — ABNORMAL LOW (ref 12.0–15.0)
Immature Granulocytes: 1 %
Lymphocytes Relative: 5 %
Lymphs Abs: 0.4 10*3/uL — ABNORMAL LOW (ref 0.7–4.0)
MCH: 29.6 pg (ref 26.0–34.0)
MCHC: 29.5 g/dL — ABNORMAL LOW (ref 30.0–36.0)
MCV: 100.3 fL — ABNORMAL HIGH (ref 80.0–100.0)
Monocytes Absolute: 0.5 10*3/uL (ref 0.1–1.0)
Monocytes Relative: 6 %
Neutro Abs: 7.4 10*3/uL (ref 1.7–7.7)
Neutrophils Relative %: 87 %
Platelets: 186 10*3/uL (ref 150–400)
RBC: 2.91 MIL/uL — ABNORMAL LOW (ref 3.87–5.11)
RDW: 20.4 % — ABNORMAL HIGH (ref 11.5–15.5)
WBC: 8.4 10*3/uL (ref 4.0–10.5)
nRBC: 0.2 % (ref 0.0–0.2)

## 2019-02-19 LAB — COMPREHENSIVE METABOLIC PANEL
ALT: 10 U/L (ref 0–44)
AST: 10 U/L — ABNORMAL LOW (ref 15–41)
Albumin: 2.6 g/dL — ABNORMAL LOW (ref 3.5–5.0)
Alkaline Phosphatase: 85 U/L (ref 38–126)
Anion gap: 13 (ref 5–15)
BUN: 78 mg/dL — ABNORMAL HIGH (ref 8–23)
CO2: 26 mmol/L (ref 22–32)
Calcium: 8.5 mg/dL — ABNORMAL LOW (ref 8.9–10.3)
Chloride: 101 mmol/L (ref 98–111)
Creatinine, Ser: 5.12 mg/dL — ABNORMAL HIGH (ref 0.44–1.00)
GFR calc Af Amer: 9 mL/min — ABNORMAL LOW (ref >60)
GFR calc non Af Amer: 8 mL/min — ABNORMAL LOW (ref >60)
Glucose, Bld: 252 mg/dL — ABNORMAL HIGH (ref 70–99)
Potassium: 3.8 mmol/L (ref 3.5–5.1)
Sodium: 140 mmol/L (ref 135–145)
Total Bilirubin: 1.3 mg/dL — ABNORMAL HIGH (ref 0.3–1.2)
Total Protein: 5.5 g/dL — ABNORMAL LOW (ref 6.5–8.1)

## 2019-02-19 LAB — LACTIC ACID, PLASMA: Lactic Acid, Venous: 1.7 mmol/L (ref 0.5–1.9)

## 2019-02-19 MED ORDER — SODIUM CHLORIDE 0.9% FLUSH: 3.0000 mL | Freq: Once | INTRAVENOUS | Status: DC

## 2019-02-19 MED ORDER — ACETAMINOPHEN 325 MG PO TABS
650.0000 mg | ORAL_TABLET | Freq: Once | ORAL | Status: AC
  Administered 2019-02-19: 650 mg via ORAL
  Filled 2019-02-19: qty 2

## 2019-02-19 MED ORDER — DOXYCYCLINE HYCLATE 100 MG PO CAPS: 100.0000 mg | ORAL_CAPSULE | Freq: Two times a day (BID) | ORAL | 0 refills | Status: DC

## 2019-02-19 MED ORDER — HYDROCODONE-ACETAMINOPHEN 5-325 MG PO TABS: 1.0000 | ORAL_TABLET | Freq: Four times a day (QID) | ORAL | 0 refills | Status: AC | PRN

## 2019-02-19 MED ORDER — HYDROCODONE-ACETAMINOPHEN 5-325 MG PO TABS
1.0000 | ORAL_TABLET | Freq: Once | ORAL | Status: AC
  Administered 2019-02-19: 1 via ORAL
  Filled 2019-02-19: qty 1

## 2019-02-19 MED ORDER — DOXYCYCLINE HYCLATE 100 MG PO TABS
100.0000 mg | ORAL_TABLET | Freq: Once | ORAL | Status: AC
  Administered 2019-02-19: 100 mg via ORAL
  Filled 2019-02-19: qty 1

## 2019-02-19 NOTE — ED Provider Notes (Signed)
[facility] EMERGENCY DEPARTMENT Provider Note   CSN: [id number] Arrival date & time: 02/19/19  1115     History   Chief Complaint No chief complaint on file.   HPI Norma [name] is a 73 y.o. female.     HPI  73 year old female presents with painful leg and wounds.  She is vague about the time course but it sounds like these wounds have been there for a couple weeks.  Have been draining some.  No fevers or vomiting.  The pain seemed to be a little more today so she came in.  Daughter endorses that she lives at home alone but family is able to help her throughout the day.  The patient thinks that the bilateral hip wounds came from pressure from her walker chair.  Past Medical History:  Diagnosis Date   Anxiety    Carotid artery occlusion    CKD (chronic kidney disease) stage 3, GFR 30-59 ml/min (HCC) 04/18/2014   Claudication ([place])    Constipation    Due to patient taking iron supplements   Coronary artery disease    DDD (degenerative disc disease)    Diabetes mellitus    Diabetic coma (HCC)    DJD (degenerative joint disease)    DJD (degenerative joint disease)    Edema    Bilateral lower extermities   Gout    Hepatitis    Hep C, has been treated with "[place]" - cured per pt   Hyperlipidemia    Hypertension    Iron deficiency anemia    Leg pain    Obesity    PAD (peripheral artery disease) (HCC)    Shortness of breath    exertion    Patient Active Problem List   Diagnosis Date Noted   Anemia of chronic disease 08/27/2016   Presence of right artificial knee joint 04/30/2016   Postoperative wound infection of right hip 04/24/2016   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   Degenerative arthritis of hip 03/06/2016   Gout of left knee 10/31/2015   Right hip pain 10/29/2015   Acute pain of left knee 10/29/2015   DM type 2 causing CKD stage 3 ([place]) 10/29/2015   Morbid obesity ([place]) 10/29/2015   CKD (chronic  kidney disease) stage 4, GFR 15-29 ml/min (HCC) 04/18/2014   Aortic atherosclerosis ([place]) 04/18/2014   Bilateral carotid bruits 04/16/2014   Pulsatile tinnitus of right ear 04/16/2014   Diarrhea 09/18/2011   Peripheral vascular disease, unspecified ([place]) 08/14/2011   Claudication ([place]) 08/14/2011   Edema 06/29/2011   Leucocytosis 06/12/2011   Acute on chronic kidney disease, stage 4 05/28/2011   Respiratory failure, acute ([place]) 05/25/2011    Class: Acute   S/P aorto-bifemoral bypass surgery 05/25/2011    Class: Acute   PAD (peripheral artery disease) ([place])    Coronary artery disease    Hyperlipidemia    Hypertension     Past Surgical History:  Procedure Laterality Date   AORTA - BILATERAL FEMORAL ARTERY BYPASS GRAFT  05/25/2011   Procedure: AORTA BIFEMORAL BYPASS GRAFT;  Surgeon: Mal Misty, MD;  Location: [facility];  Service: Vascular;  Laterality: N/A;   APPLICATION OF WOUND VAC Right 04/23/2016   Procedure: APPLICATION OF WOUND VAC CHANGED;  Surgeon: Meredith Pel, MD;  Location: [facility];  Service: Orthopedics;  Laterality: Right;   AV FISTULA PLACEMENT Left 03/03/2018   Procedure: BRACHIOCEPHALIC ARTERIOVENOUS FISTULA CREATION LEFT ARM;  Surgeon: Elam Dutch, MD;  Location: [facility];  Service: Vascular;  Laterality: Left;   BREAST EXCISIONAL BIOPSY Right    BREAST SURGERY     CARDIAC CATHETERIZATION  12/01/2009   EF 65%   CARDIOVASCULAR STRESS TEST  11/28/2009   EF 75%   COLONOSCOPY W/ POLYPECTOMY     CORONARY ARTERY BYPASS GRAFT  12/08/2009   LIMA GRAFT TO THE DISTAL LAD AND SAPHENOUS VEIN GRAFT TO THE OBTUSE MARGINAL VESSEL   EYE SURGERY     FISTULA SUPERFICIALIZATION Left 07/28/2018   Procedure: LEFT ARM FISTULA SUPERFICIALIZATION;  Surgeon: Elam Dutch, MD;  Location: [facility];  Service: Vascular;  Laterality: Left;   I&D EXTREMITY Right 04/23/2016   Procedure: IRRIGATION AND DEBRIDEMENT EXTREMITY;  Surgeon: Meredith Pel, MD;   Location: [facility];  Service: Orthopedics;  Laterality: Right;   INCISION AND DRAINAGE HIP Right 04/20/2016   Procedure: IRRIGATION AND DEBRIDEMENT HIP WITH POLY EXCHANGE, ABX BEAD PLACEMENT, WOUND VAC ;  Surgeon: Meredith Pel, MD;  Location: [facility];  Service: Orthopedics;  Laterality: Right;  RIGHT HIP SUPERFICIAL I&D, POSSIBLE DEEP I&D, LINER EXCHANGE, ABX BEAD PLACEMENT, WOUND VAC.    PR VEIN BYPASS GRAFT,AORTO-FEM-POP  05/25/11   REMOVAL OF FIBROUS CYST FROM RIGHT BREAST  10+ YEARS   RETINAL DETACHMENT SURGERY  10+ YEARS   LEFT EYE   TOTAL HIP ARTHROPLASTY Right 03/06/2016   TOTAL HIP ARTHROPLASTY Right 03/06/2016   Procedure: RIGHT TOTAL HIP ARTHROPLASTY ANTERIOR APPROACH;  Surgeon: Meredith Pel, MD;  Location: [facility];  Service: Orthopedics;  Laterality: Right;   TRANSTHORACIC ECHOCARDIOGRAM  12/01/2009   EF 60-65%     OB History   No obstetric history on file.      Home Medications    Prior to Admission medications   Medication Sig Start Date End Date Taking? Authorizing Provider  acetaminophen (TYLENOL) 500 MG tablet Take 500 mg by mouth every 6 (six) hours as needed for moderate pain or headache.    Yes [provider]  allopurinol (ZYLOPRIM) 100 MG tablet Take 100 mg by mouth daily as needed (for gout).    Yes [provider]  aspirin EC 81 MG tablet Take 81 mg by mouth daily.   Yes [provider]  calcitRIOL (ROCALTROL) 0.5 MCG capsule Take 0.5 mcg by mouth daily.   Yes [provider]  calcium acetate (PHOSLO) 667 MG capsule Take 667 mg by mouth 3 (three) times daily before meals.  03/19/18  Yes [provider]  carvedilol (COREG) 12.5 MG tablet Take 1 tablet (12.5 mg total) by mouth 2 (two) times daily. Patient taking differently: Take 12.5 mg by mouth daily.  09/23/13  Yes Martinique, Peter M, MD  cloNIDine (CATAPRES) 0.3 MG tablet Take 0.3 mg by mouth daily.  12/30/14  Yes [provider]  ferrous sulfate 325  (65 FE) MG tablet Take 325 mg by mouth 2 (two) times daily.    Yes [provider]  furosemide (LASIX) 80 MG tablet Take 160 mg by mouth 2 (two) times daily.    Yes [provider]  HYDROcodone-acetaminophen (NORCO/VICODIN) 5-325 MG tablet Take 1 tablet by mouth every 6 (six) hours as needed for moderate pain. Patient taking differently: Take 1 tablet by mouth every 6 (six) hours as needed for severe pain.  03/03/18  Yes Laurence Slate M, PA-C  insulin glargine (LANTUS) 100 UNIT/ML injection Inject 0.2 mLs (20 Units total) into the skin 2 (two) times daily. Patient taking differently: Inject 20-24 Units into the skin See  admin instructions. Inject 24 units SQ in the morning and inject 20 units SQ in the evening 04/25/16  Yes Dean, Tonna Corner, MD  isosorbide dinitrate (ISORDIL) 20 MG tablet Take 20 mg by mouth 2 (two) times daily.  11/24/14  Yes [provider]  promethazine (PHENERGAN) 12.5 MG tablet Take 12.5 mg by mouth every 8 (eight) hours as needed for nausea or vomiting.  03/18/18  Yes [provider]  Sennosides 25 MG TABS Take 25 mg by mouth every 3 (three) days.   Yes [provider]  sodium bicarbonate 650 MG tablet Take 650 mg by mouth 2 (two) times daily.   Yes [provider]  doxycycline (VIBRAMYCIN) 100 MG capsule Take 1 capsule (100 mg total) by mouth 2 (two) times daily. One po bid x 7 days 02/19/19   Sherwood Gambler, MD  glucose blood (PRECISION QID TEST) test strip by Other route Three (3) times a day. 04/08/15   [provider]    Family History Family History  Problem Relation Age of Onset   Hypertension Mother    Coronary artery disease Father    Hypertension Sister    Cirrhosis Brother    Kidney disease Daughter    Breast cancer Neg Hx     Social History Social History   Tobacco Use   Smoking status: Former Smoker    Years: 20.00    Types: Cigarettes    Quit date: 07/10/1991    Years since  quitting: 27.6   Smokeless tobacco: Never Used  Substance Use Topics   Alcohol use: No   Drug use: No     Allergies   Sulfasalazine, Iohexol, and Sulfa antibiotics   Review of Systems Review of Systems  Constitutional: Negative for fever.  Musculoskeletal: Positive for arthralgias.  Skin: Positive for wound.  All other systems reviewed and are negative.    Physical Exam Updated Vital Signs BP (!) 163/47    Pulse (!) 45    Temp 98.3 F (36.8 C) (Oral)    Resp 17    Ht 5\' 2"  (1.575 m)    Wt 81.6 kg    SpO2 96%    BMI 32.92 kg/m   Physical Exam Vitals signs and nursing note reviewed.  Constitutional:      Appearance: She is well-developed.  HENT:     Head: Normocephalic and atraumatic.     Right Ear: External ear normal.     Left Ear: External ear normal.     Nose: Nose normal.  Eyes:     General:        Right eye: No discharge.        Left eye: No discharge.  Cardiovascular:     Rate and Rhythm: Normal rate and regular rhythm.     Heart sounds: Normal heart sounds.  Pulmonary:     Effort: Pulmonary effort is normal.     Breath sounds: Normal breath sounds.  Abdominal:     General: There is no distension.     Palpations: Abdomen is soft.     Tenderness: There is no abdominal tenderness.  Musculoskeletal:     Right lower leg: Edema present.     Left lower leg: Edema present.     Comments: See pictures. No significant tenderness to any of these wounds. Some drainage from the lateral hip wounds.   Skin:    General: Skin is warm and dry.  Neurological:     Mental Status: She is alert.  Psychiatric:  Mood and Affect: Mood is not anxious.            ED Treatments / Results  Labs (all labs ordered are listed, but only abnormal results are displayed) Labs Reviewed  COMPREHENSIVE METABOLIC PANEL - Abnormal; Notable for the following components:      Result Value   Glucose, Bld 252 (*)    BUN 78 (*)    Creatinine, Ser 5.12 (*)    Calcium 8.5  (*)    Total Protein 5.5 (*)    Albumin 2.6 (*)    AST 10 (*)    Total Bilirubin 1.3 (*)    GFR calc non Af Amer 8 (*)    GFR calc Af Amer 9 (*)    All other components within normal limits  CBC WITH DIFFERENTIAL/PLATELET - Abnormal; Notable for the following components:   RBC 2.91 (*)    Hemoglobin 8.6 (*)    HCT 29.2 (*)    MCV 100.3 (*)    MCHC 29.5 (*)    RDW 20.4 (*)    Lymphs Abs 0.4 (*)    All other components within normal limits  LACTIC ACID, PLASMA  URINALYSIS, ROUTINE W REFLEX MICROSCOPIC    EKG None  Radiology Dg Tibia/fibula Right  Result Date: 02/19/2019 CLINICAL DATA:  Right lower leg wound. EXAM: RIGHT TIBIA AND FIBULA - 2 VIEW COMPARISON:  None. FINDINGS: There is no evidence of fracture or other focal bone lesions. Vascular calcifications are noted. No lytic destruction is seen to suggest osteomyelitis. IMPRESSION: Normal right tibia and fibula. Electronically Signed   By: Marijo Conception M.D.   On: 02/19/2019 13:52   Dg Hips Bilat W Or Wo Pelvis 3-4 Views  Result Date: 02/19/2019 CLINICAL DATA:  Bilateral hip wounds. EXAM: DG HIP (WITH OR WITHOUT PELVIS) 3-4V BILAT COMPARISON:  None. FINDINGS: There is no evidence of hip fracture or dislocation. Status post right shoulder arthroplasty. Severe degenerative changes seen involving the left hip. No lytic destruction is seen to suggest osteomyelitis. IMPRESSION: Postsurgical and degenerative changes as described above. No acute abnormality seen in the pelvis or hips. Electronically Signed   By: Marijo Conception M.D.   On: 02/19/2019 13:51    Procedures Procedures (including critical care time)  Medications Ordered in ED Medications  sodium chloride flush (NS) 0.9 % injection 3 mL (has no administration in time range)  acetaminophen (TYLENOL) tablet 650 mg (650 mg Oral Given 02/19/19 1305)  doxycycline (VIBRA-TABS) tablet 100 mg (100 mg Oral Given 02/19/19 1420)     Initial Impression / Assessment and Plan / ED  Course  I have reviewed the triage vital signs and the nursing notes.  Pertinent labs & imaging results that were available during my care of the patient were reviewed by me and considered in my medical decision making (see chart for details).        These wounds do appear to have some degree of infection given the mild drainage noted.  However there is no overt tenderness or concern for deep space infection such as necrotizing fasciitis.  X-ray is unremarkable.  Vital signs are okay including no fever.  Screening for sepsis is negative.  These appear to be subacute wounds.  I discussed with daughter and case management has helped with home health.  Otherwise I will give her doxycycline and refer her to the wound center.  I do not think she needs IV antibiotics at this time.  We will give Norco for  pain control.  Final Clinical Impressions(s) / ED Diagnoses   Final diagnoses:  Pressure injury of skin of hip, unspecified injury stage, unspecified laterality    ED Discharge Orders         [place]     02/19/19 1422    Face-to-face encounter (required for Medicare/Medicaid patients)    Comments: I Ephraim Hamburger certify that this patient is under my care and that I, or a nurse practitioner or physician's assistant working with me, had a face-to-face encounter that meets the physician face-to-face encounter requirements with this patient on 02/19/2019. The encounter with the patient was in whole, or in part for the following medical condition(s) which is the primary reason for home health care (List medical condition): wounds, needs wet to dry dressing changes   02/19/19 1422    doxycycline (VIBRAMYCIN) 100 MG capsule  2 times daily     02/19/19 1432           Sherwood Gambler, MD 02/19/19 1447

## 2019-02-19 NOTE — Discharge Instructions (Signed)
If you develop fever, vomiting, worsening pain or drainage, or any other new/concerning symptoms then return to the ER for evaluation.

## 2019-02-19 NOTE — ED Triage Notes (Signed)
Pt reports rt lower leg wound with drainage. Pt also reports bilateral hip wounds. Family states that she has not been ambulatory and has had urinary incontinence for 4 days. Pt alert answering questions appropriately in triage.

## 2019-02-19 NOTE — ED Notes (Signed)
Patient transported to X-ray 

## 2019-02-20 NOTE — TOC Initial Note (Addendum)
Transition of Care [facility]) - Initial/Assessment Note    Patient Details  Name: Norma [name] MRN: [id number] Date of Birth: 27-Jun-1946  [facility] CM/SW Contact:    Erenest Rasher, RN Phone Number: [phone] 02/20/2019, 11:50 AM  Clinical Narrative:                 02/19/2019 200 pm Attempted call to dtr, Leonides Grills, left HIPAA compliant voice mail for return call.   02/20/2019 1120 am Contacted pt and states she had HH but not sure the name. Pt states she is disoriented this am. Gave permission to speak to sister, [name]. Spoke to sister. Offered choice for [facility]. Sister agreeable to [facility]. Will attempt to find out her agency name. Pt reports having RW and bedside commode. Sister states they are working on rotating staying with pt, another sister and daughter. Contacted Dr Merilyn Baba office and pt [facility] was arranged with [facility]. The office will arrange appt for pt to come in next week. Explained to office pt has wounds. Contacted [facility] with additions to [facility]. Pt will need HHRN to do dressing changes. Waiting for call back to confirm [facility] RN.   02/20/2019 Received call from [facility] and they will have [place] do start of care on 02/23/2019. Faxed orders to [facility]. Made sister, [place] aware.   Expected Discharge Plan: [place] Barriers to Discharge: No Barriers Identified   Patient Goals and CMS Choice Patient states their goals for this hospitalization and ongoing recovery are:: be able to stay in my home independently CMS Medicare.gov Compare Post Acute Care list provided to:: Patient Choice offered to / list presented to : Patient  Expected Discharge Plan and Services Expected Discharge Plan: [place]   Discharge Planning Services: CM Consult Post Acute Care Choice: [place] arrangements for the past 2 months: Apartment                           HH Arranged: PT, RN, OT, Nurse's Aide, Social Work [employer] Agency: [facility]  Healthcare Date [place]: 02/20/19 Time [place]: [id number]    Prior Living Arrangements/Services Living arrangements for the past 2 months: Apartment Lives with:: Self Patient language and need for interpreter reviewed:: Yes Do you feel safe going back to the place where you live?: Yes      Need for Family Participation in Patient Care: Yes (Comment) Care giver support system in place?: Yes (comment) Current home services: DME(rolling walker, bedside commode) Criminal Activity/Legal Involvement Pertinent to Current Situation/Hospitalization: No - Comment as needed  Activities of Daily Living      Permission Sought/Granted Permission sought to share information with : Case Manager, PCP Permission granted to share information with : Yes, Verbal Permission Granted  Share Information with NAME: Brynda Rim  Permission granted to share info w AGENCY: [facility]  Permission granted to share info w Relationship: sister, daughter  Permission granted to share info w Contact Information: [phone]  Emotional Assessment       Orientation: : Oriented to Self, Oriented to Place   Psych Involvement: No (comment)  Admission diagnosis:  wound check Patient Active Problem List   Diagnosis Date Noted  . Anemia of chronic disease 08/27/2016  . Presence of right artificial knee joint 04/30/2016  . Postoperative wound infection of right hip 04/24/2016  . 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 Degenerative arthritis of hip  03/06/2016  . Gout of left knee 10/31/2015  . Right hip pain 10/29/2015  . Acute pain of left knee 10/29/2015  . DM type 2 causing CKD stage 3 ([place]) 10/29/2015  . Morbid obesity ([place]) 10/29/2015  . CKD (chronic kidney disease) stage 4, GFR 15-29 ml/min (HCC) 04/18/2014  . Aortic atherosclerosis ([place]) 04/18/2014  . Bilateral carotid bruits 04/16/2014  . Pulsatile tinnitus of right ear 04/16/2014  . Diarrhea 09/18/2011  . Peripheral  vascular disease, unspecified ([place]) 08/14/2011  . Claudication ([place]) 08/14/2011  . Edema 06/29/2011  . Leucocytosis 06/12/2011  . Acute on chronic kidney disease, stage 4 05/28/2011  . Respiratory failure, acute ([place]) 05/25/2011    Class: Acute  . S/P aorto-bifemoral bypass surgery 05/25/2011    Class: Acute  . PAD (peripheral artery disease) ([place])   . Coronary artery disease   . Hyperlipidemia   . Hypertension    PCP:  Bartholome Bill, MD Pharmacy:   [facility] [place] [address] Phone: [phone] Fax: [phone]     Social Determinants of Health (SDOH) Interventions    Readmission Risk Interventions No flowsheet data found.

## 2019-03-02 ENCOUNTER — Other Ambulatory Visit: Payer: Self-pay | Admitting: Internal Medicine

## 2019-03-03 ENCOUNTER — Other Ambulatory Visit: Payer: Self-pay | Admitting: Internal Medicine

## 2019-03-05 ENCOUNTER — Inpatient Hospital Stay: Payer: Medicare Other

## 2019-03-05 ENCOUNTER — Telehealth: Payer: Self-pay | Admitting: Internal Medicine

## 2019-03-05 NOTE — Telephone Encounter (Signed)
Pt called to cancel all injections appts per 8/27 sch message - pt says she is not getting injections with dialysis - message sent to MD so he is aware.

## 2019-03-08 ENCOUNTER — Encounter (HOSPITAL_COMMUNITY): Payer: Self-pay | Admitting: Emergency Medicine

## 2019-03-08 ENCOUNTER — Emergency Department (HOSPITAL_COMMUNITY)
Admission: EM | Admit: 2019-03-08 | Discharge: 2019-03-08 | Disposition: A | Discharge status: Home / Self Care | Payer: Medicare Other | Attending: Emergency Medicine | Admitting: Emergency Medicine

## 2019-03-08 ENCOUNTER — Other Ambulatory Visit: Payer: Self-pay

## 2019-03-08 DIAGNOSIS — Z79899 Other long term (current) drug therapy: Secondary | ICD-10-CM | POA: Insufficient documentation

## 2019-03-08 DIAGNOSIS — Y929 Unspecified place or not applicable: Secondary | ICD-10-CM | POA: Insufficient documentation

## 2019-03-08 DIAGNOSIS — S31829A Unspecified open wound of left buttock, initial encounter: Secondary | ICD-10-CM | POA: Diagnosis present

## 2019-03-08 DIAGNOSIS — S71002A Unspecified open wound, left hip, initial encounter: Secondary | ICD-10-CM

## 2019-03-08 DIAGNOSIS — Z87891 Personal history of nicotine dependence: Secondary | ICD-10-CM | POA: Diagnosis not present

## 2019-03-08 DIAGNOSIS — E1122 Type 2 diabetes mellitus with diabetic chronic kidney disease: Secondary | ICD-10-CM | POA: Diagnosis not present

## 2019-03-08 DIAGNOSIS — Y999 Unspecified external cause status: Secondary | ICD-10-CM | POA: Diagnosis not present

## 2019-03-08 DIAGNOSIS — I129 Hypertensive chronic kidney disease with stage 1 through stage 4 chronic kidney disease, or unspecified chronic kidney disease: Secondary | ICD-10-CM | POA: Insufficient documentation

## 2019-03-08 DIAGNOSIS — Y939 Activity, unspecified: Secondary | ICD-10-CM | POA: Insufficient documentation

## 2019-03-08 DIAGNOSIS — X58XXXA Exposure to other specified factors, initial encounter: Secondary | ICD-10-CM | POA: Diagnosis not present

## 2019-03-08 DIAGNOSIS — N183 Chronic kidney disease, stage 3 (moderate): Secondary | ICD-10-CM | POA: Diagnosis not present

## 2019-03-08 DIAGNOSIS — Z7982 Long term (current) use of aspirin: Secondary | ICD-10-CM | POA: Insufficient documentation

## 2019-03-08 LAB — CBC WITH DIFFERENTIAL/PLATELET
Abs Immature Granulocytes: 0.16 10*3/uL — ABNORMAL HIGH (ref 0.00–0.07)
Basophils Absolute: 0 10*3/uL (ref 0.0–0.1)
Basophils Relative: 0 %
Eosinophils Absolute: 0.1 10*3/uL (ref 0.0–0.5)
Eosinophils Relative: 1 %
HCT: 24.4 % — ABNORMAL LOW (ref 36.0–46.0)
Hemoglobin: 7.2 g/dL — ABNORMAL LOW (ref 12.0–15.0)
Immature Granulocytes: 2 %
Lymphocytes Relative: 7 %
Lymphs Abs: 0.6 10*3/uL — ABNORMAL LOW (ref 0.7–4.0)
MCH: 29.1 pg (ref 26.0–34.0)
MCHC: 29.5 g/dL — ABNORMAL LOW (ref 30.0–36.0)
MCV: 98.8 fL (ref 80.0–100.0)
Monocytes Absolute: 0.7 10*3/uL (ref 0.1–1.0)
Monocytes Relative: 8 %
Neutro Abs: 6.6 10*3/uL (ref 1.7–7.7)
Neutrophils Relative %: 82 %
Platelets: 304 10*3/uL (ref 150–400)
RBC: 2.47 MIL/uL — ABNORMAL LOW (ref 3.87–5.11)
RDW: 18.6 % — ABNORMAL HIGH (ref 11.5–15.5)
WBC: 8.1 10*3/uL (ref 4.0–10.5)
nRBC: 0 % (ref 0.0–0.2)

## 2019-03-08 LAB — BASIC METABOLIC PANEL
Anion gap: 12 (ref 5–15)
BUN: 28 mg/dL — ABNORMAL HIGH (ref 8–23)
CO2: 27 mmol/L (ref 22–32)
Calcium: 8.4 mg/dL — ABNORMAL LOW (ref 8.9–10.3)
Chloride: 98 mmol/L (ref 98–111)
Creatinine, Ser: 3.3 mg/dL — ABNORMAL HIGH (ref 0.44–1.00)
GFR calc Af Amer: 15 mL/min — ABNORMAL LOW (ref >60)
GFR calc non Af Amer: 13 mL/min — ABNORMAL LOW (ref >60)
Glucose, Bld: 69 mg/dL — ABNORMAL LOW (ref 70–99)
Potassium: 3.5 mmol/L (ref 3.5–5.1)
Sodium: 137 mmol/L (ref 135–145)

## 2019-03-08 MED ORDER — OXYCODONE HCL 5 MG PO TABS: 5.0000 mg | ORAL_TABLET | Freq: Once | ORAL | Status: DC

## 2019-03-08 MED ORDER — CALCIUM ACETATE (PHOS BINDER) 667 MG PO CAPS: 667.0000 mg | ORAL_CAPSULE | Freq: Three times a day (TID) | ORAL | 0 refills | Status: DC

## 2019-03-08 MED ORDER — ACETAMINOPHEN 500 MG PO TABS
1000.0000 mg | ORAL_TABLET | Freq: Once | ORAL | Status: AC
  Administered 2019-03-08: 1000 mg via ORAL
  Filled 2019-03-08: qty 2

## 2019-03-08 MED ORDER — LIDOCAINE-EPINEPHRINE (PF) 2 %-1:200000 IJ SOLN
INTRAMUSCULAR | Status: AC
  Administered 2019-03-08: 11:00:00 via INTRAMUSCULAR
  Filled 2019-03-08: qty 20

## 2019-03-08 NOTE — ED Provider Notes (Signed)
[facility] EMERGENCY DEPARTMENT Provider Note   CSN: [id number] Arrival date & time: 03/08/19  1015     History   Chief Complaint Chief Complaint  Patient presents with  . wound bleed    HPI Norma [name] is a 73 y.o. female.     72 yo F with a significant past medical history of calciphylaxis comes in with a chief complaints of a bleeding ulcer.  Patient has had issues with ulcers to her buttock.  Is currently getting dressing changes 3 times a week.  Is scheduled to see in wound care in about a week's time.  No fevers.  Per her dressing change nurse the wounds are actually getting better.  She started having some bleeding last night and then this morning felt like the bleeding was much worse.  Eventually called 911 for evaluation.  Found to have what was reported as pulsatile bleeding and was sent here for evaluation.  The history is provided by the patient.  Illness Severity:  Moderate Onset quality:  Gradual Duration:  2 days Timing:  Constant Progression:  Worsening Chronicity:  New Associated symptoms: no chest pain, no congestion, no fever, no headaches, no myalgias, no nausea, no rhinorrhea, no shortness of breath, no vomiting and no wheezing     Past Medical History:  Diagnosis Date  . Anxiety   . Carotid artery occlusion   . CKD (chronic kidney disease) stage 3, GFR 30-59 ml/min (HCC) 04/18/2014  . Claudication ([place])   . Constipation    Due to patient taking iron supplements  . Coronary artery disease   . DDD (degenerative disc disease)   . Diabetes mellitus   . Diabetic coma ([place])   . DJD (degenerative joint disease)   . DJD (degenerative joint disease)   . Edema    Bilateral lower extermities  . Gout   . Hepatitis    Hep C, has been treated with "[place]" - cured per pt  . Hyperlipidemia   . Hypertension   . Iron deficiency anemia   . Leg pain   . Obesity   . PAD (peripheral artery disease) ([place])   . Shortness of breath    exertion    Patient Active Problem List   Diagnosis Date Noted  . Anemia of chronic disease 08/27/2016  . Presence of right artificial knee joint 04/30/2016  . Postoperative wound infection of right hip 04/24/2016  . 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 Degenerative arthritis of hip 03/06/2016  . Gout of left knee 10/31/2015  . Right hip pain 10/29/2015  . Acute pain of left knee 10/29/2015  . DM type 2 causing CKD stage 3 ([place]) 10/29/2015  . Morbid obesity ([place]) 10/29/2015  . CKD (chronic kidney disease) stage 4, GFR 15-29 ml/min (HCC) 04/18/2014  . Aortic atherosclerosis ([place]) 04/18/2014  . Bilateral carotid bruits 04/16/2014  . Pulsatile tinnitus of right ear 04/16/2014  . Diarrhea 09/18/2011  . Peripheral vascular disease, unspecified ([place]) 08/14/2011  . Claudication ([place]) 08/14/2011  . Edema 06/29/2011  . Leucocytosis 06/12/2011  . Acute on chronic kidney disease, stage 4 05/28/2011  . Respiratory failure, acute ([place]) 05/25/2011    Class: Acute  . S/P aorto-bifemoral bypass surgery 05/25/2011    Class: Acute  . PAD (peripheral artery disease) ([place])   . Coronary artery disease   . Hyperlipidemia   . Hypertension     Past Surgical History:  Procedure Laterality Date  . AORTA - BILATERAL FEMORAL ARTERY BYPASS GRAFT  05/25/2011   Procedure: AORTA BIFEMORAL BYPASS GRAFT;  Surgeon: Mal Misty, MD;  Location: [employer] [facility];  Service: Vascular;  Laterality: N/A;  . APPLICATION OF WOUND VAC Right 04/23/2016   Procedure: APPLICATION OF WOUND VAC CHANGED;  Surgeon: Meredith Pel, MD;  Location: [facility];  Service: Orthopedics;  Laterality: Right;  . AV FISTULA PLACEMENT Left 03/03/2018   Procedure: BRACHIOCEPHALIC ARTERIOVENOUS FISTULA CREATION LEFT ARM;  Surgeon: Elam Dutch, MD;  Location: [facility];  Service: Vascular;  Laterality: Left;  . BREAST EXCISIONAL BIOPSY Right   . BREAST SURGERY    . CARDIAC CATHETERIZATION  12/01/2009   EF 65%  . CARDIOVASCULAR STRESS  TEST  11/28/2009   EF 75%  . COLONOSCOPY W/ POLYPECTOMY    . CORONARY ARTERY BYPASS GRAFT  12/08/2009   LIMA GRAFT TO THE DISTAL LAD AND SAPHENOUS VEIN GRAFT TO THE OBTUSE MARGINAL VESSEL  . EYE SURGERY    . FISTULA SUPERFICIALIZATION Left 07/28/2018   Procedure: LEFT ARM FISTULA SUPERFICIALIZATION;  Surgeon: Elam Dutch, MD;  Location: [facility];  Service: Vascular;  Laterality: Left;  . I&D EXTREMITY Right 04/23/2016   Procedure: IRRIGATION AND DEBRIDEMENT EXTREMITY;  Surgeon: Meredith Pel, MD;  Location: [facility];  Service: Orthopedics;  Laterality: Right;  . INCISION AND DRAINAGE HIP Right 04/20/2016   Procedure: IRRIGATION AND DEBRIDEMENT HIP WITH POLY EXCHANGE, ABX BEAD PLACEMENT, WOUND VAC ;  Surgeon: Meredith Pel, MD;  Location: [facility];  Service: Orthopedics;  Laterality: Right;  RIGHT HIP SUPERFICIAL I&D, POSSIBLE DEEP I&D, LINER EXCHANGE, ABX BEAD PLACEMENT, WOUND VAC.   Marland Kitchen PR VEIN BYPASS GRAFT,AORTO-FEM-POP  05/25/11  . REMOVAL OF FIBROUS CYST FROM RIGHT BREAST  10+ YEARS  . RETINAL DETACHMENT SURGERY  10+ YEARS   LEFT EYE  . TOTAL HIP ARTHROPLASTY Right 03/06/2016  . TOTAL HIP ARTHROPLASTY Right 03/06/2016   Procedure: RIGHT TOTAL HIP ARTHROPLASTY ANTERIOR APPROACH;  Surgeon: Meredith Pel, MD;  Location: [facility];  Service: Orthopedics;  Laterality: Right;  . TRANSTHORACIC ECHOCARDIOGRAM  12/01/2009   EF 60-65%     OB History   No obstetric history on file.      Home Medications    Prior to Admission medications   Medication Sig Start Date End Date Taking? Authorizing Provider  allopurinol (ZYLOPRIM) 100 MG tablet Take 100 mg by mouth daily as needed (for gout).    Yes [provider]  carvedilol (COREG) 12.5 MG tablet Take 1 tablet (12.5 mg total) by mouth 2 (two) times daily. Patient taking differently: Take 12.5 mg by mouth 2 (two) times daily with a meal.  09/23/13  Yes Martinique, Peter M, MD  cloNIDine (CATAPRES) 0.3 MG tablet Take 0.3 mg by mouth 2  (two) times daily.  12/30/14  Yes [provider]  ferrous sulfate 325 (65 FE) MG tablet Take 325 mg by mouth 2 (two) times daily.    Yes [provider]  HYDROcodone-acetaminophen (NORCO) 5-325 MG tablet Take 1 tablet by mouth every 6 (six) hours as needed for severe pain. 02/19/19  Yes Sherwood Gambler, MD  insulin glargine (LANTUS) 100 UNIT/ML injection Inject 0.2 mLs (20 Units total) into the skin 2 (two) times daily. Patient taking differently: Inject 20-24 Units into the skin See admin instructions. Inject 24 units SQ in the morning and inject 20 units SQ in the evening 04/25/16  Yes Dean, Tonna Corner, MD  acetaminophen (TYLENOL) 500 MG tablet Take 500 mg by mouth every 6 (six) hours  as needed for moderate pain or headache.     [provider]  aspirin EC 81 MG tablet Take 81 mg by mouth daily.    [provider]  calcium acetate (PHOSLO) 667 MG capsule Take 1 capsule (667 mg total) by mouth 3 (three) times daily with meals. 03/08/19   Deno Etienne, DO  doxycycline (VIBRAMYCIN) 100 MG capsule Take 1 capsule (100 mg total) by mouth 2 (two) times daily. One po bid x 7 days Patient not taking: Reported on 03/08/2019 02/19/19   Sherwood Gambler, MD  furosemide (LASIX) 80 MG tablet Take 160 mg by mouth See admin instructions. Take onTues, Thurs,, Sat, Sun    [provider]  glucose blood (PRECISION QID TEST) test strip by Other route Three (3) times a day. 04/08/15   [provider]  isosorbide dinitrate (ISORDIL) 20 MG tablet Take 20 mg by mouth 2 (two) times daily.  11/24/14   [provider]  promethazine (PHENERGAN) 12.5 MG tablet Take 12.5 mg by mouth every 8 (eight) hours as needed for nausea or vomiting.  03/18/18   [provider]  Sennosides 25 MG TABS Take 25 mg by mouth every 3 (three) days.    [provider]  sodium bicarbonate 650 MG tablet Take 650 mg by mouth 2 (two) times daily.    [provider]     Family History Family History  Problem Relation Age of Onset  . Hypertension Mother   . Coronary artery disease Father   . Hypertension Sister   . Cirrhosis Brother   . Kidney disease Daughter   . Breast cancer Neg Hx     Social History Social History   Tobacco Use  . Smoking status: Former Smoker    Years: 20.00    Types: Cigarettes    Quit date: 07/10/1991    Years since quitting: 27.6  . Smokeless tobacco: Never Used  Substance Use Topics  . Alcohol use: No  . Drug use: No     Allergies   Sulfasalazine, Iohexol, and Sulfa antibiotics   Review of Systems Review of Systems  Constitutional: Negative for chills and fever.  HENT: Negative for congestion and rhinorrhea.   Eyes: Negative for redness and visual disturbance.  Respiratory: Negative for shortness of breath and wheezing.   Cardiovascular: Negative for chest pain and palpitations.  Gastrointestinal: Negative for nausea and vomiting.  Genitourinary: Negative for dysuria and urgency.  Musculoskeletal: Negative for arthralgias and myalgias.  Skin: Positive for wound. Negative for pallor.  Neurological: Negative for dizziness and headaches.     Physical Exam Updated Vital Signs BP (!) 152/72   Pulse (!) 59   Resp 19   Ht 5\' 2"  (1.575 m)   Wt 81 kg   SpO2 99%   BMI 32.66 kg/m   Physical Exam Vitals signs and nursing note reviewed.  Constitutional:      General: She is not in acute distress.    Appearance: She is well-developed. She is not diaphoretic.  HENT:     Head: Normocephalic and atraumatic.  Eyes:     Pupils: Pupils are equal, round, and reactive to light.  Neck:     Musculoskeletal: Normal range of motion and neck supple.  Cardiovascular:     Rate and Rhythm: Normal rate and regular rhythm.     Heart sounds: No murmur. No friction rub. No gallop.   Pulmonary:     Effort: Pulmonary effort is normal.     Breath  sounds: No wheezing or rales.  Abdominal:     General: There is no  distension.     Palpations: Abdomen is soft.     Tenderness: There is no abdominal tenderness.  Musculoskeletal:        General: No tenderness.     Comments: Wound to the left lateral buttock with capillary bleeding.  Skin:    General: Skin is warm and dry.  Neurological:     Mental Status: She is alert and oriented to person, place, and time.  Psychiatric:        Behavior: Behavior normal.      ED Treatments / Results  Labs (all labs ordered are listed, but only abnormal results are displayed) Labs Reviewed  CBC WITH DIFFERENTIAL/PLATELET - Abnormal; Notable for the following components:      Result Value   RBC 2.47 (*)    Hemoglobin 7.2 (*)    HCT 24.4 (*)    MCHC 29.5 (*)    RDW 18.6 (*)    Lymphs Abs 0.6 (*)    Abs Immature Granulocytes 0.16 (*)    All other components within normal limits  BASIC METABOLIC PANEL - Abnormal; Notable for the following components:   Glucose, Bld 69 (*)    BUN 28 (*)    Creatinine, Ser 3.30 (*)    Calcium 8.4 (*)    GFR calc non Af Amer 13 (*)    GFR calc Af Amer 15 (*)    All other components within normal limits    EKG None  Radiology No results found.  Procedures .Marland KitchenLaceration Repair  Date/Time: 03/08/2019 11:04 AM Performed by: Deno Etienne, DO Authorized by: Deno Etienne, DO   Consent:    Consent obtained:  Verbal   Consent given by:  Patient   Risks discussed:  Infection and pain   Alternatives discussed:  No treatment and delayed treatment Anesthesia (see MAR for exact dosages):    Anesthesia method:  Local infiltration   Local anesthetic:  Lidocaine 2% WITH epi Laceration details:    Location:  Pelvis   Pelvis location:  L buttock Exploration:    Hemostasis achieved with:  Epinephrine and direct pressure Post-procedure details:    Dressing:  Bulky dressing   Patient tolerance of procedure:  Tolerated well, no immediate complications Comments:     Patient's bleeding is controlled with direct pressure and  epinephrine and lidocaine injection.   (including critical care time)  Medications Ordered in ED Medications  oxyCODONE (Oxy IR/ROXICODONE) immediate release tablet 5 mg (has no administration in time range)  lidocaine-EPINEPHrine (XYLOCAINE W/EPI) 2 %-1:200000 (PF) injection ( Injection Given 03/08/19 1034)  acetaminophen (TYLENOL) tablet 1,000 mg (1,000 mg Oral Given 03/08/19 1127)     Initial Impression / Assessment and Plan / ED Course  I have reviewed the triage vital signs and the nursing notes.  Pertinent labs & imaging results that were available during my care of the patient were reviewed by me and considered in my medical decision making (see chart for details).        73 yo F with a chief complaints of bleeding from a wound on her buttock.  Started last night and then continued into this morning.  She is somewhat pale on my exam though she denies any feeling of near syncope or syncope.  Will obtain a laboratory evaluation to assess for blood loss or hyperkalemia.  Bleeding controlled at bedside with epinephrine and direct pressure.  The patient's hemoglobin has dropped  since her last visit.  7.3.  Patient asymptomatic from her hemoglobin level.  Wound was reassessed and there is no continued bleeding.  No hyperkalemia or acidosis.  I will discharge the patient and have her follow-up with her nephrologist.  The patient was somewhat confused and I talked to her about phosphate binders.  It appears she has not been taking them at home.  Family is requesting that I prescribe them for her here.  12:10 PM:  I have discussed the diagnosis/risks/treatment options with the patient and family and believe the pt to be eligible for discharge home to follow-up with PCP, nephrology. We also discussed returning to the ED immediately if new or worsening sx occur. We discussed the sx which are most concerning (e.g., sudden worsening pain, fever, inability to tolerate by mouth) that necessitate  immediate return. Medications administered to the patient during their visit and any new prescriptions provided to the patient are listed below.  Medications given during this visit Medications  oxyCODONE (Oxy IR/ROXICODONE) immediate release tablet 5 mg (has no administration in time range)  lidocaine-EPINEPHrine (XYLOCAINE W/EPI) 2 %-1:200000 (PF) injection ( Injection Given 03/08/19 1034)  acetaminophen (TYLENOL) tablet 1,000 mg (1,000 mg Oral Given 03/08/19 1127)     The patient appears reasonably screen and/or stabilized for discharge and I doubt any other medical condition or other [facility] requiring further screening, evaluation, or treatment in the ED at this time prior to discharge.    Final Clinical Impressions(s) / ED Diagnoses   Final diagnoses:  Bleeding from left hip wound, initial encounter    ED Discharge Orders         Ordered    calcium acetate (PHOSLO) 667 MG capsule  3 times daily with meals     03/08/19 [place], Linna Hoff, DO 03/08/19 1210

## 2019-03-08 NOTE — ED Triage Notes (Signed)
Pt's family called EMS for bilateral hip wound uncontrolled bleeding. New to dialysis, just started Monday. A/O x4 Bilateral pitting edema in lower extremities.

## 2019-03-13 ENCOUNTER — Other Ambulatory Visit (HOSPITAL_BASED_OUTPATIENT_CLINIC_OR_DEPARTMENT_OTHER): Payer: Self-pay | Admitting: Internal Medicine

## 2019-03-13 ENCOUNTER — Encounter (HOSPITAL_BASED_OUTPATIENT_CLINIC_OR_DEPARTMENT_OTHER): Payer: Medicare Other | Attending: Internal Medicine

## 2019-03-13 ENCOUNTER — Other Ambulatory Visit: Payer: Self-pay

## 2019-03-13 DIAGNOSIS — E1122 Type 2 diabetes mellitus with diabetic chronic kidney disease: Secondary | ICD-10-CM | POA: Diagnosis not present

## 2019-03-13 DIAGNOSIS — E1151 Type 2 diabetes mellitus with diabetic peripheral angiopathy without gangrene: Secondary | ICD-10-CM | POA: Diagnosis not present

## 2019-03-13 DIAGNOSIS — L89224 Pressure ulcer of left hip, stage 4: Secondary | ICD-10-CM | POA: Diagnosis not present

## 2019-03-13 DIAGNOSIS — Z794 Long term (current) use of insulin: Secondary | ICD-10-CM | POA: Insufficient documentation

## 2019-03-13 DIAGNOSIS — I87333 Chronic venous hypertension (idiopathic) with ulcer and inflammation of bilateral lower extremity: Secondary | ICD-10-CM | POA: Diagnosis not present

## 2019-03-13 DIAGNOSIS — Z992 Dependence on renal dialysis: Secondary | ICD-10-CM | POA: Insufficient documentation

## 2019-03-13 DIAGNOSIS — N185 Chronic kidney disease, stage 5: Secondary | ICD-10-CM | POA: Insufficient documentation

## 2019-03-13 DIAGNOSIS — L89210 Pressure ulcer of right hip, unstageable: Secondary | ICD-10-CM | POA: Insufficient documentation

## 2019-03-13 DIAGNOSIS — I120 Hypertensive chronic kidney disease with stage 5 chronic kidney disease or end stage renal disease: Secondary | ICD-10-CM | POA: Insufficient documentation

## 2019-03-13 DIAGNOSIS — Z87891 Personal history of nicotine dependence: Secondary | ICD-10-CM | POA: Insufficient documentation

## 2019-03-13 DIAGNOSIS — L97812 Non-pressure chronic ulcer of other part of right lower leg with fat layer exposed: Secondary | ICD-10-CM | POA: Insufficient documentation

## 2019-03-17 ENCOUNTER — Other Ambulatory Visit (HOSPITAL_COMMUNITY): Payer: Self-pay | Admitting: Family Medicine

## 2019-03-17 ENCOUNTER — Telehealth: Payer: Self-pay | Admitting: Cardiology

## 2019-03-17 DIAGNOSIS — R209 Unspecified disturbances of skin sensation: Secondary | ICD-10-CM

## 2019-03-17 NOTE — Telephone Encounter (Signed)
  Patient wants to speak to the nurse because she needs to be seen due to her heart murmur and other issues she is having. She wants to be seen ASAP but she can only do Tuesday or Thursday due to her dialysis. She could not do the appts on Friday that were available. Please call patient to discuss.

## 2019-03-18 ENCOUNTER — Other Ambulatory Visit (HOSPITAL_COMMUNITY): Payer: Medicare Other

## 2019-03-19 ENCOUNTER — Telehealth: Payer: Self-pay | Admitting: *Deleted

## 2019-03-19 ENCOUNTER — Other Ambulatory Visit: Payer: Self-pay

## 2019-03-19 ENCOUNTER — Ambulatory Visit (HOSPITAL_COMMUNITY)
Admission: RE | Admit: 2019-03-19 | Discharge: 2019-03-19 | Disposition: A | Discharge status: Home / Self Care | Payer: Medicare Other | Source: Ambulatory Visit | Attending: Family Medicine | Admitting: Family Medicine

## 2019-03-19 DIAGNOSIS — I87333 Chronic venous hypertension (idiopathic) with ulcer and inflammation of bilateral lower extremity: Secondary | ICD-10-CM | POA: Diagnosis not present

## 2019-03-19 DIAGNOSIS — R209 Unspecified disturbances of skin sensation: Secondary | ICD-10-CM

## 2019-03-19 NOTE — Telephone Encounter (Signed)
Returned call to patient no answer.LMTC. 

## 2019-03-19 NOTE — Telephone Encounter (Signed)
Spoke with patient and sister. Appointment time given.

## 2019-03-19 NOTE — Telephone Encounter (Signed)
Called patient no answer.Left message on personal voice mail appointment scheduled with Jory Sims DNP 03/31/19 at 2:15 pm.

## 2019-03-19 NOTE — Telephone Encounter (Signed)
Follow Up  Patient is returning phone call. Please give patient a call back. Patient states that it is urgent.

## 2019-03-19 NOTE — Telephone Encounter (Signed)
Attempted to call patient to give appointment time. Dr. Precious Haws called this office requesting appointment due to discolored finger/black finger tips. US duplex upper extremity done 03/19/2019. No answer no voice mail. Message left with office contact [facility]) at Dr. Merilyn Baba office.

## 2019-03-19 NOTE — Progress Notes (Signed)
VASCULAR LAB PRELIMINARY  PRELIMINARY  PRELIMINARY  PRELIMINARY  Bilateral upper extremity arterial duplex completed.    Preliminary report:  See CV proc for preliminary results.   Called provider and suggested they schedule patient with Dr. Oneida Alar at [facility].  Mauro Kaufmann, [facility], RVT 03/19/2019, 3:46 PM

## 2019-03-20 ENCOUNTER — Telehealth: Payer: Self-pay | Admitting: Cardiology

## 2019-03-20 NOTE — Telephone Encounter (Signed)
Pt would like a call back this morning please before she goes to dialysis. Pt stated she wants to see doctor Martinique.

## 2019-03-20 NOTE — Telephone Encounter (Signed)
Patient call returned, she states she was returning Cheryls phone call- advised that an appointment had been made for her on 09/22 at 2:15.  Patient states she will check with her sister who takes care of her if this appointment is okay and will let us know.

## 2019-03-23 NOTE — Telephone Encounter (Signed)
Spoke to patient she stated she cannot come to appointment scheduled 9/22 with Jory Sims DNP.Stated she has another appointment that day.Stated she has not been feeling well.Stated she was told by another Dr.she has a heart murmur and needs to see Dr.Jordan.Advised Dr.Jordan's schedule is full.Appointment scheduled with Jory Sims DNP 9/24 at 11:30 am.

## 2019-03-26 ENCOUNTER — Ambulatory Visit: Payer: Medicare Other

## 2019-03-26 ENCOUNTER — Ambulatory Visit (INDEPENDENT_AMBULATORY_CARE_PROVIDER_SITE_OTHER): Payer: Medicare Other | Admitting: Physician Assistant

## 2019-03-26 ENCOUNTER — Other Ambulatory Visit: Payer: Medicare Other

## 2019-03-26 ENCOUNTER — Other Ambulatory Visit: Payer: Self-pay | Admitting: *Deleted

## 2019-03-26 ENCOUNTER — Other Ambulatory Visit: Payer: Self-pay

## 2019-03-26 ENCOUNTER — Encounter: Payer: Self-pay | Admitting: *Deleted

## 2019-03-26 VITALS — BP 120/64 | HR 70 | Temp 97.9°F | Resp 14 | Ht 60.0 in | Wt 178.0 lb

## 2019-03-26 DIAGNOSIS — N186 End stage renal disease: Secondary | ICD-10-CM

## 2019-03-26 DIAGNOSIS — Z992 Dependence on renal dialysis: Secondary | ICD-10-CM | POA: Diagnosis not present

## 2019-03-26 NOTE — Progress Notes (Signed)
HISTORY AND PHYSICAL     CC:  follow up Requesting Provider:  Bartholome Bill, MD  HPI: Norma [name] is a 73 y.o. (July 25, 1945) female who presents today for evaluation for discoloration of her fingers and pain.  She had a left BC AVF on 03/03/18 by Dr. Oneida Alar and subsequently underwent side branch ligation pooring functioning AVF on 07/28/2018 also by Dr. Oneida Alar.   She was last seen on 08/14/2018 for post op visit and she did not have any numbness or tingling in her hand.   She called the office on 9/10 requesting appt to be seen for discolored black finger tips.  She had a duplex on 03/19/2019, which reveals the fistula is patent.  She is here today for evaluation.  She states that she started HD about 4 weeks ago.  Since starting dialysis, she has had pain in her left hand and it has progressively gotten worse.  She states that when she is on dialysis, the pain is excruciating.  Her hand is sore off dialysis.  She states that her fingers started turning colors.  She went to her PCP and had u/s and was referred for evaluation.  The pt is not on a statin for cholesterol management.  The pt is on a daily aspirin.   Other AC:  none The pt is on clonidine for hypertension.   The pt is diabetic.   Tobacco hx:  remote  Past Medical History:  Diagnosis Date  . Anxiety   . Carotid artery occlusion   . CKD (chronic kidney disease) stage 3, GFR 30-59 ml/min (HCC) 04/18/2014  . Claudication ([place])   . Constipation    Due to patient taking iron supplements  . Coronary artery disease   . DDD (degenerative disc disease)   . Diabetes mellitus   . Diabetic coma ([place])   . DJD (degenerative joint disease)   . DJD (degenerative joint disease)   . Edema    Bilateral lower extermities  . Gout   . Hepatitis    Hep C, has been treated with "Maverick" - cured per pt  . Hyperlipidemia   . Hypertension   . Iron deficiency anemia   . Leg pain   . Obesity   . PAD (peripheral artery disease)  ([place])   . Shortness of breath    exertion    Past Surgical History:  Procedure Laterality Date  . AORTA - BILATERAL FEMORAL ARTERY BYPASS GRAFT  05/25/2011   Procedure: AORTA BIFEMORAL BYPASS GRAFT;  Surgeon: Mal Misty, MD;  Location: [place];  Service: Vascular;  Laterality: N/A;  . APPLICATION OF WOUND VAC Right 04/23/2016   Procedure: APPLICATION OF WOUND VAC CHANGED;  Surgeon: Meredith Pel, MD;  Location: [facility];  Service: Orthopedics;  Laterality: Right;  . AV FISTULA PLACEMENT Left 03/03/2018   Procedure: BRACHIOCEPHALIC ARTERIOVENOUS FISTULA CREATION LEFT ARM;  Surgeon: Elam Dutch, MD;  Location: [place];  Service: Vascular;  Laterality: Left;  . BREAST EXCISIONAL BIOPSY Right   . BREAST SURGERY    . CARDIAC CATHETERIZATION  12/01/2009   EF 65%  . CARDIOVASCULAR STRESS TEST  11/28/2009   EF 75%  . COLONOSCOPY W/ POLYPECTOMY    . CORONARY ARTERY BYPASS GRAFT  12/08/2009   LIMA GRAFT TO THE DISTAL LAD AND SAPHENOUS VEIN GRAFT TO THE OBTUSE MARGINAL VESSEL  . EYE SURGERY    . FISTULA SUPERFICIALIZATION Left 07/28/2018   Procedure: LEFT ARM FISTULA SUPERFICIALIZATION;  Surgeon: Elam Dutch,  MD;  Location: [place];  Service: Vascular;  Laterality: Left;  . I&D EXTREMITY Right 04/23/2016   Procedure: IRRIGATION AND DEBRIDEMENT EXTREMITY;  Surgeon: Meredith Pel, MD;  Location: [place];  Service: Orthopedics;  Laterality: Right;  . INCISION AND DRAINAGE HIP Right 04/20/2016   Procedure: IRRIGATION AND DEBRIDEMENT HIP WITH POLY EXCHANGE, ABX BEAD PLACEMENT, WOUND VAC ;  Surgeon: Meredith Pel, MD;  Location: [facility];  Service: Orthopedics;  Laterality: Right;  RIGHT HIP SUPERFICIAL I&D, POSSIBLE DEEP I&D, LINER EXCHANGE, ABX BEAD PLACEMENT, WOUND VAC.   Marland Kitchen PR VEIN BYPASS GRAFT,AORTO-FEM-POP  05/25/11  . REMOVAL OF FIBROUS CYST FROM RIGHT BREAST  10+ YEARS  . RETINAL DETACHMENT SURGERY  10+ YEARS   LEFT EYE  . TOTAL HIP ARTHROPLASTY Right 03/06/2016  . TOTAL HIP  ARTHROPLASTY Right 03/06/2016   Procedure: RIGHT TOTAL HIP ARTHROPLASTY ANTERIOR APPROACH;  Surgeon: Meredith Pel, MD;  Location: [facility];  Service: Orthopedics;  Laterality: Right;  . TRANSTHORACIC ECHOCARDIOGRAM  12/01/2009   EF 60-65%    Social History   Socioeconomic History  . Marital status: Single    Spouse name: Not on file  . Number of children: 2  . Years of education: Not on file  . Highest education level: Not on file  Occupational History  . 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 Financial resource strain: Not on file  . Food insecurity    Worry: Not on file    Inability: Not on file  . Transportation needs    Medical: Not on file    Non-medical: Not on file  Tobacco Use  . Smoking status: Former Smoker    Years: 20.00    Types: Cigarettes    Quit date: 07/10/1991    Years since quitting: 27.7  . Smokeless tobacco: Never Used  Substance and Sexual Activity  . Alcohol use: No  . Drug use: No  . Sexual activity: Never  Lifestyle  . Physical activity    Days per week: Not on file    Minutes per session: Not on file  . Stress: Not on file  Relationships  . Social Herbalist on phone: Not on file    Gets together: Not on file    Attends religious service: Not on file    Active member of club or organization: Not on file    Attends meetings of clubs or organizations: Not on file    Relationship status: Not on file  . Intimate partner violence    Fear of current or ex partner: Not on file    Emotionally abused: Not on file    Physically abused: Not on file    Forced sexual activity: Not on file  Other Topics Concern  . Not on file  Social History Narrative  . Not on file    Family History  Problem Relation Age of Onset  . Hypertension Mother   . Coronary artery disease Father   . Hypertension Sister   . Cirrhosis Brother   . Kidney disease Daughter   . Breast cancer Neg Hx     Current  Outpatient Medications  Medication Sig Dispense Refill  . acetaminophen (TYLENOL) 500 MG tablet Take 500 mg by mouth every 6 (six) hours as needed for moderate pain or headache.     . allopurinol (ZYLOPRIM) 100 MG tablet Take 100 mg by mouth daily as needed (for gout).     Marland Kitchen  aspirin EC 81 MG tablet Take 81 mg by mouth daily.    . calcium acetate (PHOSLO) 667 MG capsule Take 1 capsule (667 mg total) by mouth 3 (three) times daily with meals. 90 capsule 0  . carvedilol (COREG) 12.5 MG tablet Take 1 tablet (12.5 mg total) by mouth 2 (two) times daily. (Patient taking differently: Take 12.5 mg by mouth 2 (two) times daily with a meal. ) 180 tablet 3  . cloNIDine (CATAPRES) 0.3 MG tablet Take 0.3 mg by mouth 2 (two) times daily.     Marland Kitchen doxycycline (VIBRAMYCIN) 100 MG capsule Take 1 capsule (100 mg total) by mouth 2 (two) times daily. One po bid x 7 days (Patient not taking: Reported on 03/08/2019) 14 capsule 0  . ferrous sulfate 325 (65 FE) MG tablet Take 325 mg by mouth 2 (two) times daily.     . furosemide (LASIX) 80 MG tablet Take 160 mg by mouth See admin instructions. Take onTues, Thurs,, Sat, Sun    . glucose blood (PRECISION QID TEST) test strip by Other route Three (3) times a day.    Marland Kitchen HYDROcodone-acetaminophen (NORCO) 5-325 MG tablet Take 1 tablet by mouth every 6 (six) hours as needed for severe pain. 10 tablet 0  . insulin glargine (LANTUS) 100 UNIT/ML injection Inject 0.2 mLs (20 Units total) into the skin 2 (two) times daily. (Patient taking differently: Inject 20-24 Units into the skin See admin instructions. Inject 24 units SQ in the morning and inject 20 units SQ in the evening) 10 mL 11  . isosorbide dinitrate (ISORDIL) 20 MG tablet Take 20 mg by mouth 2 (two) times daily.     . promethazine (PHENERGAN) 12.5 MG tablet Take 12.5 mg by mouth every 8 (eight) hours as needed for nausea or vomiting.     . Sennosides 25 MG TABS Take 25 mg by mouth every 3 (three) days.    . sodium bicarbonate  650 MG tablet Take 650 mg by mouth 2 (two) times daily.     No current facility-administered medications for this visit.     Allergies  Allergen Reactions  . Sulfasalazine     UNSPECIFIED REACTION   . Iohexol Itching, Rash and Other (See Comments)     Code: RASH, Desc: pt called 1 day post scanning stating that skin was red and "itching all over" some what better but still had symptom.. instructed pt to take benadryl to relieve symptoms,per dr Alvester Chou.if any problems call back/mms, Onset Date: [id number]   . Sulfa Antibiotics Other (See Comments)    UNSPECIFIED REACTION      REVIEW OF SYSTEMS:   [X]  denotes positive finding, [ ]  denotes negative finding Cardiac  Comments:  Chest pain or chest pressure:    Shortness of breath upon exertion:    Short of breath when lying flat:    Irregular heart rhythm:        Vascular    Pain in calf, thigh, or hip brought on by ambulation:    Pain in feet at night that wakes you up from your sleep:     Blood clot in your veins:    Leg swelling:         Pulmonary    Oxygen at home:    Productive cough:     Wheezing:         Neurologic    Sudden weakness in arms or legs:     Sudden numbness in arms or legs:  Sudden onset of difficulty speaking or slurred speech:    Temporary loss of vision in one eye:     Problems with dizziness:         Gastrointestinal    Blood in stool:     Vomited blood:         Genitourinary    Burning when urinating:     Blood in urine:        Psychiatric    Major depression:         Hematologic    Bleeding problems:    Problems with blood clotting too easily:        Skin    Rashes or ulcers:        Constitutional    Fever or chills:      PHYSICAL EXAMINATION:  Today's Vitals   03/26/19 1424 03/26/19 1427  BP: 120/64   Pulse: 70   Resp: 14   Temp: 97.9 F (36.6 C)   TempSrc: Temporal   SpO2: 97%   Weight: 178 lb (80.7 kg)   Height: 5' (1.524 m)   PainSc:  10-Worst pain ever   Body  mass index is 34.76 kg/m.   General:  WDWN in NAD; vital signs documented above Gait: Not observed HENT: WNL, normocephalic Pulmonary: normal non-labored breathing , without Rales, rhonchi,  wheezing Cardiac: regular HR, without  Murmurs Skin: without rashes Vascular Exam/Pulses: Left radial pulse is not palpable.  Right radial pulse is palpable Extremities: 3rd finger of the left hand black on the tip and there is discoloration on the dorsum of the hand; the fistula has an excellent thrill.   Musculoskeletal: no muscle wasting or atrophy  Neurologic: A&O X 3;  No focal weakness or paresthesias are detected Psychiatric:  The pt has Normal affect.   Non-Invasive Vascular Imaging:   Patent fistula on 03/19/2019    ASSESSMENT/PLAN:: 73 y.o. female who is s/p left BC AVF on 03/03/18 by Dr. Oneida Alar and subsequently underwent side branch ligation pooring functioning AVF on 07/28/2018 also by Dr. 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  She has decreased motor changes as well.   -pt dialyzes M/W/F 2nd shift.  Will have dialysis center change her to 1st shift on Monday and proceed with fistula ligation and placement of TDC on Monday afternoon.  Dr. Oneida Alar did discuss with pt that she is at risk for losing her finger.   Pt and her sister are in agreement.   -will place catheter and evaluate for new access in a few weeks.   Leontine Locket, PA-C [facility] [phone]  Clinic MD:   Pt seen and examined with Dr.Fields

## 2019-03-26 NOTE — H&P (View-Only) (Signed)
HISTORY AND PHYSICAL     CC:  follow up Requesting Provider:  Bartholome Bill, MD  HPI: Norma [name] is a 73 y.o. (06/07/1946) female who presents today for evaluation for discoloration of her fingers and pain.  She had a left BC AVF on 03/03/18 by Dr. Oneida Alar and subsequently underwent side branch ligation pooring functioning AVF on 07/28/2018 also by Dr. Oneida Alar.   She was last seen on 08/14/2018 for post op visit and she did not have any numbness or tingling in her hand.   She called the office on 9/10 requesting appt to be seen for discolored black finger tips.  She had a duplex on 03/19/2019, which reveals the fistula is patent.  She is here today for evaluation.  She states that she started HD about 4 weeks ago.  Since starting dialysis, she has had pain in her left hand and it has progressively gotten worse.  She states that when she is on dialysis, the pain is excruciating.  Her hand is sore off dialysis.  She states that her fingers started turning colors.  She went to her PCP and had u/s and was referred for evaluation.  The pt is not on a statin for cholesterol management.  The pt is on a daily aspirin.   Other AC:  none The pt is on clonidine for hypertension.   The pt is diabetic.   Tobacco hx:  remote  Past Medical History:  Diagnosis Date  . Anxiety   . Carotid artery occlusion   . CKD (chronic kidney disease) stage 3, GFR 30-59 ml/min (HCC) 04/18/2014  . Claudication ([place])   . Constipation    Due to patient taking iron supplements  . Coronary artery disease   . DDD (degenerative disc disease)   . Diabetes mellitus   . Diabetic coma ([place])   . DJD (degenerative joint disease)   . DJD (degenerative joint disease)   . Edema    Bilateral lower extermities  . Gout   . Hepatitis    Hep C, has been treated with "Maverick" - cured per pt  . Hyperlipidemia   . Hypertension   . Iron deficiency anemia   . Leg pain   . Obesity   . PAD (peripheral artery disease)  ([place])   . Shortness of breath    exertion    Past Surgical History:  Procedure Laterality Date  . AORTA - BILATERAL FEMORAL ARTERY BYPASS GRAFT  05/25/2011   Procedure: AORTA BIFEMORAL BYPASS GRAFT;  Surgeon: Mal Misty, MD;  Location: [facility];  Service: Vascular;  Laterality: N/A;  . APPLICATION OF WOUND VAC Right 04/23/2016   Procedure: APPLICATION OF WOUND VAC CHANGED;  Surgeon: Meredith Pel, MD;  Location: [facility];  Service: Orthopedics;  Laterality: Right;  . AV FISTULA PLACEMENT Left 03/03/2018   Procedure: BRACHIOCEPHALIC ARTERIOVENOUS FISTULA CREATION LEFT ARM;  Surgeon: Elam Dutch, MD;  Location: [facility];  Service: Vascular;  Laterality: Left;  . BREAST EXCISIONAL BIOPSY Right   . BREAST SURGERY    . CARDIAC CATHETERIZATION  12/01/2009   EF 65%  . CARDIOVASCULAR STRESS TEST  11/28/2009   EF 75%  . COLONOSCOPY W/ POLYPECTOMY    . CORONARY ARTERY BYPASS GRAFT  12/08/2009   LIMA GRAFT TO THE DISTAL LAD AND SAPHENOUS VEIN GRAFT TO THE OBTUSE MARGINAL VESSEL  . EYE SURGERY    . FISTULA SUPERFICIALIZATION Left 07/28/2018   Procedure: LEFT ARM FISTULA SUPERFICIALIZATION;  Surgeon: Elam Dutch,  MD;  Location: [facility];  Service: Vascular;  Laterality: Left;  . I&D EXTREMITY Right 04/23/2016   Procedure: IRRIGATION AND DEBRIDEMENT EXTREMITY;  Surgeon: Meredith Pel, MD;  Location: [facility];  Service: Orthopedics;  Laterality: Right;  . INCISION AND DRAINAGE HIP Right 04/20/2016   Procedure: IRRIGATION AND DEBRIDEMENT HIP WITH POLY EXCHANGE, ABX BEAD PLACEMENT, WOUND VAC ;  Surgeon: Meredith Pel, MD;  Location: [facility];  Service: Orthopedics;  Laterality: Right;  RIGHT HIP SUPERFICIAL I&D, POSSIBLE DEEP I&D, LINER EXCHANGE, ABX BEAD PLACEMENT, WOUND VAC.   Marland Kitchen PR VEIN BYPASS GRAFT,AORTO-FEM-POP  05/25/11  . REMOVAL OF FIBROUS CYST FROM RIGHT BREAST  10+ YEARS  . RETINAL DETACHMENT SURGERY  10+ YEARS   LEFT EYE  . TOTAL HIP ARTHROPLASTY Right 03/06/2016  . TOTAL HIP  ARTHROPLASTY Right 03/06/2016   Procedure: RIGHT TOTAL HIP ARTHROPLASTY ANTERIOR APPROACH;  Surgeon: Meredith Pel, MD;  Location: [facility];  Service: Orthopedics;  Laterality: Right;  . TRANSTHORACIC ECHOCARDIOGRAM  12/01/2009   EF 60-65%    Social History   Socioeconomic History  . Marital status: Single    Spouse name: Not on file  . Number of children: 2  . Years of education: Not on file  . Highest education level: Not on file  Occupational History  . 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 Financial resource strain: Not on file  . Food insecurity    Worry: Not on file    Inability: Not on file  . Transportation needs    Medical: Not on file    Non-medical: Not on file  Tobacco Use  . Smoking status: Former Smoker    Years: 20.00    Types: Cigarettes    Quit date: 07/10/1991    Years since quitting: 27.7  . Smokeless tobacco: Never Used  Substance and Sexual Activity  . Alcohol use: No  . Drug use: No  . Sexual activity: Never  Lifestyle  . Physical activity    Days per week: Not on file    Minutes per session: Not on file  . Stress: Not on file  Relationships  . Social Herbalist on phone: Not on file    Gets together: Not on file    Attends religious service: Not on file    Active member of club or organization: Not on file    Attends meetings of clubs or organizations: Not on file    Relationship status: Not on file  . Intimate partner violence    Fear of current or ex partner: Not on file    Emotionally abused: Not on file    Physically abused: Not on file    Forced sexual activity: Not on file  Other Topics Concern  . Not on file  Social History Narrative  . Not on file    Family History  Problem Relation Age of Onset  . Hypertension Mother   . Coronary artery disease Father   . Hypertension Sister   . Cirrhosis Brother   . Kidney disease Daughter   . Breast cancer Neg Hx     Current  Outpatient Medications  Medication Sig Dispense Refill  . acetaminophen (TYLENOL) 500 MG tablet Take 500 mg by mouth every 6 (six) hours as needed for moderate pain or headache.     . allopurinol (ZYLOPRIM) 100 MG tablet Take 100 mg by mouth daily as needed (for gout).     Marland Kitchen  aspirin EC 81 MG tablet Take 81 mg by mouth daily.    . calcium acetate (PHOSLO) 667 MG capsule Take 1 capsule (667 mg total) by mouth 3 (three) times daily with meals. 90 capsule 0  . carvedilol (COREG) 12.5 MG tablet Take 1 tablet (12.5 mg total) by mouth 2 (two) times daily. (Patient taking differently: Take 12.5 mg by mouth 2 (two) times daily with a meal. ) 180 tablet 3  . cloNIDine (CATAPRES) 0.3 MG tablet Take 0.3 mg by mouth 2 (two) times daily.     Marland Kitchen doxycycline (VIBRAMYCIN) 100 MG capsule Take 1 capsule (100 mg total) by mouth 2 (two) times daily. One po bid x 7 days (Patient not taking: Reported on 03/08/2019) 14 capsule 0  . ferrous sulfate 325 (65 FE) MG tablet Take 325 mg by mouth 2 (two) times daily.     . furosemide (LASIX) 80 MG tablet Take 160 mg by mouth See admin instructions. Take onTues, Thurs,, Sat, Sun    . glucose blood (PRECISION QID TEST) test strip by Other route Three (3) times a day.    Marland Kitchen HYDROcodone-acetaminophen (NORCO) 5-325 MG tablet Take 1 tablet by mouth every 6 (six) hours as needed for severe pain. 10 tablet 0  . insulin glargine (LANTUS) 100 UNIT/ML injection Inject 0.2 mLs (20 Units total) into the skin 2 (two) times daily. (Patient taking differently: Inject 20-24 Units into the skin See admin instructions. Inject 24 units SQ in the morning and inject 20 units SQ in the evening) 10 mL 11  . isosorbide dinitrate (ISORDIL) 20 MG tablet Take 20 mg by mouth 2 (two) times daily.     . promethazine (PHENERGAN) 12.5 MG tablet Take 12.5 mg by mouth every 8 (eight) hours as needed for nausea or vomiting.     . Sennosides 25 MG TABS Take 25 mg by mouth every 3 (three) days.    . sodium bicarbonate  650 MG tablet Take 650 mg by mouth 2 (two) times daily.     No current facility-administered medications for this visit.     Allergies  Allergen Reactions  . Sulfasalazine     UNSPECIFIED REACTION   . Iohexol Itching, Rash and Other (See Comments)     Code: RASH, Desc: pt called 1 day post scanning stating that skin was red and "itching all over" some what better but still had symptom.. instructed pt to take benadryl to relieve symptoms,per dr Alvester Chou.if any problems call back/mms, Onset Date: [id number]   . Sulfa Antibiotics Other (See Comments)    UNSPECIFIED REACTION      REVIEW OF SYSTEMS:   [X]  denotes positive finding, [ ]  denotes negative finding Cardiac  Comments:  Chest pain or chest pressure:    Shortness of breath upon exertion:    Short of breath when lying flat:    Irregular heart rhythm:        Vascular    Pain in calf, thigh, or hip brought on by ambulation:    Pain in feet at night that wakes you up from your sleep:     Blood clot in your veins:    Leg swelling:         Pulmonary    Oxygen at home:    Productive cough:     Wheezing:         Neurologic    Sudden weakness in arms or legs:     Sudden numbness in arms or legs:  Sudden onset of difficulty speaking or slurred speech:    Temporary loss of vision in one eye:     Problems with dizziness:         Gastrointestinal    Blood in stool:     Vomited blood:         Genitourinary    Burning when urinating:     Blood in urine:        Psychiatric    Major depression:         Hematologic    Bleeding problems:    Problems with blood clotting too easily:        Skin    Rashes or ulcers:        Constitutional    Fever or chills:      PHYSICAL EXAMINATION:  Today's Vitals   03/26/19 1424 03/26/19 1427  BP: 120/64   Pulse: 70   Resp: 14   Temp: 97.9 F (36.6 C)   TempSrc: Temporal   SpO2: 97%   Weight: 178 lb (80.7 kg)   Height: 5' (1.524 m)   PainSc:  10-Worst pain ever   Body  mass index is 34.76 kg/m.   General:  WDWN in NAD; vital signs documented above Gait: Not observed HENT: WNL, normocephalic Pulmonary: normal non-labored breathing , without Rales, rhonchi,  wheezing Cardiac: regular HR, without  Murmurs Skin: without rashes Vascular Exam/Pulses: Left radial pulse is not palpable.  Right radial pulse is palpable Extremities: 3rd finger of the left hand black on the tip and there is discoloration on the dorsum of the hand; the fistula has an excellent thrill.   Musculoskeletal: no muscle wasting or atrophy  Neurologic: A&O X 3;  No focal weakness or paresthesias are detected Psychiatric:  The pt has Normal affect.   Non-Invasive Vascular Imaging:   Patent fistula on 03/19/2019    ASSESSMENT/PLAN:: 73 y.o. female who is s/p left BC AVF on 03/03/18 by Dr. Oneida Alar and subsequently underwent side branch ligation pooring functioning AVF on 07/28/2018 also by Dr. 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  She has decreased motor changes as well.   -pt dialyzes M/W/F 2nd shift.  Will have dialysis center change her to 1st shift on Monday and proceed with fistula ligation and placement of TDC on Monday afternoon.  Dr. Oneida Alar did discuss with pt that she is at risk for losing her finger.   Pt and her sister are in agreement.   -will place catheter and evaluate for new access in a few weeks.   Leontine Locket, PA-C [facility] [phone]  Clinic MD:   Pt seen and examined with Dr.Fields

## 2019-03-27 NOTE — Progress Notes (Signed)
SDW pre-op instructions give to pt daughter, Norma [name] [facility]). Norma [name] suggested that pt complete her pre-op assessment questions on DOS. Daughter denies that pt had a chest x ray and EKG in the last year. Daughter made aware to have pt stop taking vitamins, fish oil and herbal medications. Do not take any NSAIDs ie: Ibuprofen, Advil, Naproxen (Aleve), Motrin, BC and Goody Powder. Daughter made aware to have pt take 6 units of Lantus insulin on DOS if CBG > 70. Pt made aware to check CBG every 2 hours prior to arrival to hospital on DOS. Pt made aware to treat a CBG < 70 with 4 ounces of apple or cranberry juice, wait 15 minutes after intervention to recheck BG, if BG remains < 70, call Short Stay unit to speak with a nurse. Daughter verbalized understanding of all pre-op instructions.

## 2019-03-28 ENCOUNTER — Other Ambulatory Visit (HOSPITAL_COMMUNITY)
Admission: RE | Admit: 2019-03-28 | Discharge: 2019-03-28 | Disposition: A | Discharge status: Home / Self Care | Payer: Medicare Other | Source: Ambulatory Visit | Attending: Vascular Surgery | Admitting: Vascular Surgery

## 2019-03-28 DIAGNOSIS — Z20828 Contact with and (suspected) exposure to other viral communicable diseases: Secondary | ICD-10-CM | POA: Insufficient documentation

## 2019-03-28 DIAGNOSIS — T82898A Other specified complication of vascular prosthetic devices, implants and grafts, initial encounter: Secondary | ICD-10-CM | POA: Insufficient documentation

## 2019-03-28 DIAGNOSIS — Z01812 Encounter for preprocedural laboratory examination: Secondary | ICD-10-CM | POA: Diagnosis present

## 2019-03-29 LAB — SARS CORONAVIRUS 2 (TAT 6-24 HRS): SARS Coronavirus 2: NEGATIVE

## 2019-03-30 ENCOUNTER — Ambulatory Visit (HOSPITAL_COMMUNITY): Payer: Medicare Other | Admitting: Anesthesiology

## 2019-03-30 ENCOUNTER — Ambulatory Visit (HOSPITAL_COMMUNITY): Payer: Medicare Other

## 2019-03-30 ENCOUNTER — Encounter (HOSPITAL_COMMUNITY)
Admission: RE | Disposition: A | Discharge status: Home / Self Care | Payer: Self-pay | Source: Home / Self Care | Attending: Vascular Surgery

## 2019-03-30 ENCOUNTER — Ambulatory Visit (HOSPITAL_COMMUNITY)
Admission: RE | Admit: 2019-03-30 | Discharge: 2019-03-30 | Disposition: A | Discharge status: Home / Self Care | Payer: Medicare Other | Attending: Vascular Surgery | Admitting: Vascular Surgery

## 2019-03-30 ENCOUNTER — Other Ambulatory Visit: Payer: Self-pay | Admitting: *Deleted

## 2019-03-30 DIAGNOSIS — Z882 Allergy status to sulfonamides status: Secondary | ICD-10-CM | POA: Insufficient documentation

## 2019-03-30 DIAGNOSIS — Z794 Long term (current) use of insulin: Secondary | ICD-10-CM | POA: Insufficient documentation

## 2019-03-30 DIAGNOSIS — K5903 Drug induced constipation: Secondary | ICD-10-CM | POA: Diagnosis not present

## 2019-03-30 DIAGNOSIS — T454X5A Adverse effect of iron and its compounds, initial encounter: Secondary | ICD-10-CM | POA: Insufficient documentation

## 2019-03-30 DIAGNOSIS — I2510 Atherosclerotic heart disease of native coronary artery without angina pectoris: Secondary | ICD-10-CM

## 2019-03-30 DIAGNOSIS — Z419 Encounter for procedure for purposes other than remedying health state, unspecified: Secondary | ICD-10-CM

## 2019-03-30 DIAGNOSIS — Z992 Dependence on renal dialysis: Secondary | ICD-10-CM | POA: Insufficient documentation

## 2019-03-30 DIAGNOSIS — Z951 Presence of aortocoronary bypass graft: Secondary | ICD-10-CM | POA: Diagnosis not present

## 2019-03-30 DIAGNOSIS — I132 Hypertensive heart and chronic kidney disease with heart failure and with stage 5 chronic kidney disease, or end stage renal disease: Secondary | ICD-10-CM | POA: Insufficient documentation

## 2019-03-30 DIAGNOSIS — I480 Paroxysmal atrial fibrillation: Secondary | ICD-10-CM | POA: Insufficient documentation

## 2019-03-30 DIAGNOSIS — Z79899 Other long term (current) drug therapy: Secondary | ICD-10-CM | POA: Insufficient documentation

## 2019-03-30 DIAGNOSIS — E669 Obesity, unspecified: Secondary | ICD-10-CM | POA: Insufficient documentation

## 2019-03-30 DIAGNOSIS — Z87891 Personal history of nicotine dependence: Secondary | ICD-10-CM | POA: Diagnosis not present

## 2019-03-30 DIAGNOSIS — Z96641 Presence of right artificial hip joint: Secondary | ICD-10-CM | POA: Diagnosis not present

## 2019-03-30 DIAGNOSIS — I272 Pulmonary hypertension, unspecified: Secondary | ICD-10-CM | POA: Insufficient documentation

## 2019-03-30 DIAGNOSIS — Y832 Surgical operation with anastomosis, bypass or graft as the cause of abnormal reaction of the patient, or of later complication, without mention of misadventure at the time of the procedure: Secondary | ICD-10-CM | POA: Insufficient documentation

## 2019-03-30 DIAGNOSIS — E1122 Type 2 diabetes mellitus with diabetic chronic kidney disease: Secondary | ICD-10-CM | POA: Insufficient documentation

## 2019-03-30 DIAGNOSIS — N186 End stage renal disease: Secondary | ICD-10-CM

## 2019-03-30 DIAGNOSIS — T82898A Other specified complication of vascular prosthetic devices, implants and grafts, initial encounter: Secondary | ICD-10-CM

## 2019-03-30 DIAGNOSIS — Z7982 Long term (current) use of aspirin: Secondary | ICD-10-CM | POA: Insufficient documentation

## 2019-03-30 DIAGNOSIS — I5033 Acute on chronic diastolic (congestive) heart failure: Secondary | ICD-10-CM | POA: Insufficient documentation

## 2019-03-30 DIAGNOSIS — Z6832 Body mass index (BMI) 32.0-32.9, adult: Secondary | ICD-10-CM | POA: Diagnosis not present

## 2019-03-30 DIAGNOSIS — D509 Iron deficiency anemia, unspecified: Secondary | ICD-10-CM | POA: Diagnosis not present

## 2019-03-30 DIAGNOSIS — M1990 Unspecified osteoarthritis, unspecified site: Secondary | ICD-10-CM | POA: Diagnosis not present

## 2019-03-30 DIAGNOSIS — L98493 Non-pressure chronic ulcer of skin of other sites with necrosis of muscle: Secondary | ICD-10-CM

## 2019-03-30 DIAGNOSIS — Z452 Encounter for adjustment and management of vascular access device: Secondary | ICD-10-CM

## 2019-03-30 DIAGNOSIS — E1151 Type 2 diabetes mellitus with diabetic peripheral angiopathy without gangrene: Secondary | ICD-10-CM | POA: Insufficient documentation

## 2019-03-30 HISTORY — PX: LIGATION OF ARTERIOVENOUS  FISTULA: SHX5948

## 2019-03-30 HISTORY — PX: INSERTION OF DIALYSIS CATHETER: SHX1324

## 2019-03-30 LAB — POCT I-STAT 4, (NA,K, GLUC, HGB,HCT)
Glucose, Bld: 94 mg/dL (ref 70–99)
HCT: 29 % — ABNORMAL LOW (ref 36.0–46.0)
Hemoglobin: 9.9 g/dL — ABNORMAL LOW (ref 12.0–15.0)
Potassium: 3.7 mmol/L (ref 3.5–5.1)
Sodium: 133 mmol/L — ABNORMAL LOW (ref 135–145)

## 2019-03-30 LAB — GLUCOSE, CAPILLARY
Glucose-Capillary: 197 mg/dL — ABNORMAL HIGH (ref 70–99)
Glucose-Capillary: 46 mg/dL — ABNORMAL LOW (ref 70–99)

## 2019-03-30 SURGERY — LIGATION OF ARTERIOVENOUS  FISTULA
Anesthesia: Monitor Anesthesia Care | Site: Chest | Laterality: Right

## 2019-03-30 MED ORDER — FENTANYL CITRATE (PF) 100 MCG/2ML IJ SOLN
INTRAMUSCULAR | Status: DC | PRN
  Administered 2019-03-30: 50 ug via INTRAVENOUS

## 2019-03-30 MED ORDER — ONDANSETRON HCL 4 MG/2ML IJ SOLN
INTRAMUSCULAR | Status: AC
  Administered 2019-03-30: 4 mg
  Filled 2019-03-30: qty 2

## 2019-03-30 MED ORDER — ACETAMINOPHEN-CODEINE #3 300-30 MG PO TABS: 1.0000 | ORAL_TABLET | ORAL | 0 refills | Status: DC | PRN

## 2019-03-30 MED ORDER — CEFAZOLIN SODIUM-DEXTROSE 2-4 GM/100ML-% IV SOLN
INTRAVENOUS | Status: AC
  Filled 2019-03-30: qty 100

## 2019-03-30 MED ORDER — ONDANSETRON HCL 4 MG/2ML IJ SOLN: 4.0000 mg | Freq: Once | INTRAMUSCULAR | Status: DC

## 2019-03-30 MED ORDER — LACTATED RINGERS IV SOLN: INTRAVENOUS | Status: DC

## 2019-03-30 MED ORDER — LIDOCAINE HCL (PF) 1 % IJ SOLN
INTRAMUSCULAR | Status: DC | PRN
  Administered 2019-03-30: 17 mL

## 2019-03-30 MED ORDER — PROPOFOL 500 MG/50ML IV EMUL
INTRAVENOUS | Status: DC | PRN
  Administered 2019-03-30: 25 ug/kg/min via INTRAVENOUS

## 2019-03-30 MED ORDER — CEFAZOLIN SODIUM-DEXTROSE 2-4 GM/100ML-% IV SOLN
2.0000 g | INTRAVENOUS | Status: AC
  Administered 2019-03-30: 14:00:00 2 g via INTRAVENOUS

## 2019-03-30 MED ORDER — MEPERIDINE HCL 25 MG/ML IJ SOLN: 6.2500 mg | INTRAMUSCULAR | Status: DC | PRN

## 2019-03-30 MED ORDER — FENTANYL CITRATE (PF) 100 MCG/2ML IJ SOLN: 25.0000 ug | INTRAMUSCULAR | Status: DC | PRN

## 2019-03-30 MED ORDER — MIDAZOLAM HCL 2 MG/2ML IJ SOLN
INTRAMUSCULAR | Status: AC
  Filled 2019-03-30: qty 2

## 2019-03-30 MED ORDER — SODIUM CHLORIDE 0.9 % IV SOLN
INTRAVENOUS | Status: DC
  Administered 2019-03-30: 12:00:00 via INTRAVENOUS

## 2019-03-30 MED ORDER — BUPIVACAINE-EPINEPHRINE (PF) 0.25% -1:200000 IJ SOLN
INTRAMUSCULAR | Status: AC
  Filled 2019-03-30: qty 30

## 2019-03-30 MED ORDER — ONDANSETRON HCL 4 MG/2ML IJ SOLN
INTRAMUSCULAR | Status: AC
  Filled 2019-03-30: qty 2

## 2019-03-30 MED ORDER — APIXABAN 5 MG PO TABS: 5.0000 mg | ORAL_TABLET | Freq: Two times a day (BID) | ORAL | Status: DC

## 2019-03-30 MED ORDER — FUROSEMIDE 10 MG/ML IJ SOLN
INTRAMUSCULAR | Status: AC
  Filled 2019-03-30: qty 8

## 2019-03-30 MED ORDER — FENTANYL CITRATE (PF) 250 MCG/5ML IJ SOLN
INTRAMUSCULAR | Status: AC
  Filled 2019-03-30: qty 5

## 2019-03-30 MED ORDER — HEPARIN SODIUM (PORCINE) 1000 UNIT/ML IJ SOLN
INTRAMUSCULAR | Status: DC | PRN
  Administered 2019-03-30: 3.4 [IU] via INTRAVENOUS

## 2019-03-30 MED ORDER — DEXTROSE 50 % IV SOLN
1.0000 | Freq: Once | INTRAVENOUS | Status: AC
  Administered 2019-03-30: 20:00:00 50 mL via INTRAVENOUS

## 2019-03-30 MED ORDER — 0.9 % SODIUM CHLORIDE (POUR BTL) OPTIME
TOPICAL | Status: DC | PRN
  Administered 2019-03-30: 1000 mL

## 2019-03-30 MED ORDER — MIDAZOLAM HCL 2 MG/2ML IJ SOLN: 0.5000 mg | Freq: Once | INTRAMUSCULAR | Status: DC | PRN

## 2019-03-30 MED ORDER — ONDANSETRON HCL 4 MG/2ML IJ SOLN
INTRAMUSCULAR | Status: DC | PRN
  Administered 2019-03-30: 4 mg via INTRAVENOUS

## 2019-03-30 MED ORDER — PROPOFOL 10 MG/ML IV BOLUS
INTRAVENOUS | Status: AC
  Filled 2019-03-30: qty 20

## 2019-03-30 MED ORDER — MIDAZOLAM HCL 5 MG/5ML IJ SOLN
INTRAMUSCULAR | Status: DC | PRN
  Administered 2019-03-30 (×2): 1 mg via INTRAVENOUS

## 2019-03-30 MED ORDER — FUROSEMIDE 10 MG/ML IJ SOLN
80.0000 mg | Freq: Once | INTRAMUSCULAR | Status: AC
  Administered 2019-03-30: 80 mg via INTRAVENOUS
  Filled 2019-03-30: qty 8

## 2019-03-30 MED ORDER — PHENYLEPHRINE 40 MCG/ML (10ML) SYRINGE FOR IV PUSH (FOR BLOOD PRESSURE SUPPORT)
PREFILLED_SYRINGE | INTRAVENOUS | Status: DC | PRN
  Administered 2019-03-30: 160 ug via INTRAVENOUS

## 2019-03-30 MED ORDER — SODIUM CHLORIDE 0.9 % IV SOLN
INTRAVENOUS | Status: AC
  Filled 2019-03-30: qty 1.2

## 2019-03-30 MED ORDER — DEXTROSE 50 % IV SOLN
INTRAVENOUS | Status: AC
  Administered 2019-03-30: 50 mL via INTRAVENOUS
  Filled 2019-03-30: qty 50

## 2019-03-30 MED ORDER — LIDOCAINE HCL (PF) 1 % IJ SOLN
INTRAMUSCULAR | Status: AC
  Filled 2019-03-30: qty 30

## 2019-03-30 MED ORDER — PROMETHAZINE HCL 25 MG/ML IJ SOLN: 6.2500 mg | INTRAMUSCULAR | Status: DC | PRN

## 2019-03-30 MED ORDER — SODIUM CHLORIDE 0.9 % IV SOLN
INTRAVENOUS | Status: DC | PRN
  Administered 2019-03-30: 14:00:00 500 mL

## 2019-03-30 MED ORDER — SODIUM CHLORIDE 0.9 % IV SOLN
INTRAVENOUS | Status: DC | PRN
  Administered 2019-03-30: 14:00:00 50 ug/min via INTRAVENOUS

## 2019-03-30 SURGICAL SUPPLY — 62 items
BAG DECANTER FOR FLEXI CONT (MISCELLANEOUS) ×3 IMPLANT
BIOPATCH RED 1 DISK 7.0 (GAUZE/BANDAGES/DRESSINGS) ×3 IMPLANT
CANISTER SUCT 3000ML PPV (MISCELLANEOUS) ×3 IMPLANT
CATH PALINDROME RT-P 15FX19CM (CATHETERS) IMPLANT
CATH PALINDROME RT-P 15FX23CM (CATHETERS) ×1 IMPLANT
CATH PALINDROME RT-P 15FX28CM (CATHETERS) IMPLANT
CATH PALINDROME RT-P 15FX55CM (CATHETERS) IMPLANT
CATH STRAIGHT 5FR 65CM (CATHETERS) IMPLANT
CHLORAPREP W/TINT 26 (MISCELLANEOUS) ×3 IMPLANT
COVER PROBE W GEL 5X96 (DRAPES) IMPLANT
COVER SURGICAL LIGHT HANDLE (MISCELLANEOUS) ×3 IMPLANT
COVER WAND RF STERILE (DRAPES) ×3 IMPLANT
DECANTER SPIKE VIAL GLASS SM (MISCELLANEOUS) IMPLANT
DERMABOND ADVANCED (GAUZE/BANDAGES/DRESSINGS) ×2
DERMABOND ADVANCED .7 DNX12 (GAUZE/BANDAGES/DRESSINGS) ×2 IMPLANT
DRAPE C-ARM 42X72 X-RAY (DRAPES) ×3 IMPLANT
DRAPE CHEST BREAST 15X10 FENES (DRAPES) ×3 IMPLANT
ELECT REM PT RETURN 9FT ADLT (ELECTROSURGICAL) ×3
ELECTRODE REM PT RTRN 9FT ADLT (ELECTROSURGICAL) ×2 IMPLANT
GAUZE 4X4 16PLY RFD (DISPOSABLE) ×3 IMPLANT
GLOVE BIO SURGEON STRL SZ 6 (GLOVE) ×1 IMPLANT
GLOVE BIO SURGEON STRL SZ 6.5 (GLOVE) ×1 IMPLANT
GLOVE BIO SURGEON STRL SZ7 (GLOVE) ×1 IMPLANT
GLOVE BIO SURGEON STRL SZ7.5 (GLOVE) ×5 IMPLANT
GLOVE BIOGEL M 7.0 STRL (GLOVE) ×1 IMPLANT
GLOVE BIOGEL PI IND STRL 6 (GLOVE) IMPLANT
GLOVE BIOGEL PI IND STRL 7.0 (GLOVE) IMPLANT
GLOVE BIOGEL PI IND STRL 8 (GLOVE) IMPLANT
GLOVE BIOGEL PI INDICATOR 6 (GLOVE) ×1
GLOVE BIOGEL PI INDICATOR 7.0 (GLOVE) ×1
GLOVE BIOGEL PI INDICATOR 8 (GLOVE) ×1
GOWN STRL REUS W/ TWL LRG LVL3 (GOWN DISPOSABLE) ×6 IMPLANT
GOWN STRL REUS W/TWL LRG LVL3 (GOWN DISPOSABLE) ×3
HEMOSTAT SPONGE AVITENE ULTRA (HEMOSTASIS) IMPLANT
KIT BASIN OR (CUSTOM PROCEDURE TRAY) ×3 IMPLANT
KIT TURNOVER KIT B (KITS) ×3 IMPLANT
LOOP VESSEL MINI RED (MISCELLANEOUS) IMPLANT
NDL 18GX1X1/2 (RX/OR ONLY) (NEEDLE) ×2 IMPLANT
NDL HYPO 25GX1X1/2 BEV (NEEDLE) ×2 IMPLANT
NEEDLE 18GX1X1/2 (RX/OR ONLY) (NEEDLE) ×3 IMPLANT
NEEDLE HYPO 25GX1X1/2 BEV (NEEDLE) ×3 IMPLANT
NS IRRIG 1000ML POUR BTL (IV SOLUTION) ×3 IMPLANT
PACK CV ACCESS (CUSTOM PROCEDURE TRAY) ×3 IMPLANT
PACK SURGICAL SETUP 50X90 (CUSTOM PROCEDURE TRAY) ×3 IMPLANT
PAD ARMBOARD 7.5X6 YLW CONV (MISCELLANEOUS) ×6 IMPLANT
SET MICROPUNCTURE 5F STIFF (MISCELLANEOUS) IMPLANT
SUT ETHILON 3 0 PS 1 (SUTURE) ×3 IMPLANT
SUT PROLENE 6 0 CC (SUTURE) IMPLANT
SUT SILK 0 TIES 10X30 (SUTURE) ×1 IMPLANT
SUT VIC AB 3-0 SH 27 (SUTURE) ×1
SUT VIC AB 3-0 SH 27X BRD (SUTURE) ×2 IMPLANT
SUT VIC AB 4-0 PS2 18 (SUTURE) ×1 IMPLANT
SUT VICRYL 4-0 PS2 18IN ABS (SUTURE) ×3 IMPLANT
SYR 10ML LL (SYRINGE) ×3 IMPLANT
SYR 20ML LL LF (SYRINGE) ×6 IMPLANT
SYR 5ML LL (SYRINGE) ×3 IMPLANT
SYR CONTROL 10ML LL (SYRINGE) ×3 IMPLANT
TOWEL GREEN STERILE (TOWEL DISPOSABLE) ×6 IMPLANT
TOWEL GREEN STERILE FF (TOWEL DISPOSABLE) ×3 IMPLANT
UNDERPAD 30X30 (UNDERPADS AND DIAPERS) ×3 IMPLANT
WATER STERILE IRR 1000ML POUR (IV SOLUTION) ×3 IMPLANT
WIRE AMPLATZ SS-J .035X180CM (WIRE) IMPLANT

## 2019-03-30 NOTE — Op Note (Signed)
Procedure: Ligation left arm AV fistula, placement right internal jugular vein 23 cm palindrome catheter  Preoperative diagnosis: #1 end-stage renal disease  2.  Ischemic steal left hand  Postoperative diagnosis: Same  Anesthesia: Local with IV sedation  Operative findings: 23 cm palindrome catheter right internal jugular vein  Operative details: After obtaining form consent, patient taken the operating.  The patient placed supine position operating table.  Patient's entire left upper extremities prepped and draped in usual sterile fashion.  Local anesthesia was infiltrated over the proximal aspect of the left arm AV fistula.  Incision was made in this location carried down through the subcutaneous tissues down the level of fistula.  The fistula was pulsatile and slightly aneurysmal about 8 mm diameter.  It was dissected free circumferentially.  It was then doubly ligated with 2-0 silk ties.  There was no further flow in the fistula.  Patient had a biphasic radial Doppler signal at this point.  There was no ulnar Doppler.  Next hemostasis was obtained.  Subcutaneous tissues and skin were closed with a 4-0 Vicryl subcuticular stitch.  Dermabond was applied.  At this point the patient's entire neck and chest were prepped and draped in usual sterile fashion.  Local anesthesia was infiltrated over the right internal jugular vein.  Ultrasound was used to identify the right internal jugular vein which had normal compressibility and respiratory variation.  An introducer needle was then used to cannulate the right internal jugular vein and a 035 J-tip guidewire threaded down into the right atrium under fluoroscopic guidance.  Sequential 1214 and 16 French dilators were placed over the guidewire in the right atrium.  23 cm palindrome catheter was then advanced and through the peel-away sheath to the right atrium.  Peel-away sheath was removed catheter was tunneled subcutaneously cut the length and the hub  attached.  It was noted to flush and draw easily.  Catheter was sutured the skin with nylon sutures.  Neck insertion site was closed with a Vicryl stitch.  Dermabond was applied.  Catheter was low the concentrated heparin solution.  The patient tired procedure well and there were no complications.  The instrument sponge needle count was correct end of the case.  Patient taken the recovery in stable condition.  Patient was noted to be in atrial fibrillation preoperatively.  This is apparently new onset.  She will be evaluated by cardiology in the recovery room.  Ruta Hinds, MD [facility] Office: [phone] Pager: [phone]

## 2019-03-30 NOTE — Anesthesia Postprocedure Evaluation (Signed)
Anesthesia Post Note  Patient: Norma [name]  Procedure(s) Performed: LIGATION OF ARTERIOVENOUS  FISTULA LEFT ARM (Left Arm Lower) INSERTION OF Right Internal Jugular DIALYSIS CATHETER (Right Chest)     Patient location during evaluation: PACU Anesthesia Type: MAC Level of consciousness: awake and alert, patient cooperative and oriented Pain management: pain level controlled Vital Signs Assessment: post-procedure vital signs reviewed and stable Respiratory status: spontaneous breathing, nonlabored ventilation and respiratory function stable Cardiovascular status: blood pressure returned to baseline and stable Postop Assessment: no apparent nausea or vomiting Anesthetic complications: no    Last Vitals:  Vitals:   03/30/19 1530 03/30/19 1552  BP: (!) 148/81 (!) 146/69  Pulse: 70 72  Resp: 18 20  Temp: (!) 36.2 C   SpO2: 100% 100%    Last Pain:  Vitals:   03/30/19 1600  TempSrc:   PainSc: Asleep                 Keefer Soulliere,E. Adolph Clutter

## 2019-03-30 NOTE — Transfer of Care (Signed)
Immediate Anesthesia Transfer of Care Note  Patient: Norma [name]  Procedure(s) Performed: LIGATION OF ARTERIOVENOUS  FISTULA LEFT ARM (Left Arm Lower) INSERTION OF Right Internal Jugular DIALYSIS CATHETER (Right Chest)  Patient Location: PACU  Anesthesia Type:MAC  Level of Consciousness: drowsy and patient cooperative  Airway & Oxygen Therapy: Patient Spontanous Breathing  Post-op Assessment: Report given to RN and Post -op Vital signs reviewed and stable  Post vital signs: Reviewed and stable  Last Vitals:  Vitals Value Taken Time  BP    Temp    Pulse    Resp    SpO2      Last Pain:  Vitals:   03/30/19 1147  TempSrc: Oral         Complications: No apparent anesthesia complications

## 2019-03-30 NOTE — Progress Notes (Signed)
1932 - pt c/o feeling light headed - CBG recheck at [facility]- report off to nurse  (CJ), call to Dr Fransisco Beau, no answer, f/u call to [facility] (charge CRNA) -will follow up w/ Dr Fransisco Beau. Pt given OJ by CJ (RN at bedside). Pt A &Ox4. See read back telephone orders from Dr Fransisco Beau for 1 amp D50 - given as charted. Will recheck CBG per protocol.

## 2019-03-30 NOTE — Anesthesia Preprocedure Evaluation (Addendum)
Anesthesia Evaluation  Patient identified by MRN, date of birth, ID band Patient awake    Reviewed: Allergy & Precautions, NPO status , Patient's Chart, lab work & pertinent test results  History of Anesthesia Complications Negative for: history of anesthetic complications  Airway Mallampati: I  TM Distance: >3 FB Neck ROM: Full    Dental  (+) Dental Advisory Given   Pulmonary former smoker,  03/28/2019 SARS coronavirus NEG   breath sounds clear to auscultation       Cardiovascular hypertension, Pt. on medications and Pt. on home beta blockers (-) angina+ CAD, + CABG and + Peripheral Vascular Disease (s/p aorto-bifem)   Rhythm:Irregular Rate:Normal  '14 ECHO: EF 60-65%, valves OK   Neuro/Psych negative neurological ROS     GI/Hepatic Neg liver ROS, GERD  Controlled,  Endo/Other  diabetes (glu 94), Insulin Dependent  Renal/GU ESRFRenal disease (MWF,  K+ 3.7)     Musculoskeletal  (+) Arthritis ,   Abdominal   Peds  Hematology  (+) Blood dyscrasia (Hb 9.9), anemia ,   Anesthesia Other Findings   Reproductive/Obstetrics                            Anesthesia Physical Anesthesia Plan  ASA: III  Anesthesia Plan: MAC   Post-op Pain Management:    Induction:   PONV Risk Score and Plan: 2 and Treatment may vary due to age or medical condition and Ondansetron  Airway Management Planned: Natural Airway and Simple Face Mask  Additional Equipment:   Intra-op Plan:   Post-operative Plan:   Informed Consent: I have reviewed the patients History and Physical, chart, labs and discussed the procedure including the risks, benefits and alternatives for the proposed anesthesia with the patient or authorized representative who has indicated his/her understanding and acceptance.     Dental advisory given  Plan Discussed with: CRNA and Surgeon  Anesthesia Plan Comments: (Possible new onset  Afib, rate OK, will make sure Dr Martinique aware)        Anesthesia Quick Evaluation

## 2019-03-30 NOTE — Consult Note (Signed)
Cardiology Consultation:   Patient ID: Norma [name] MRN: [id number]; DOB: 19-Jul-1945  Admit date: 03/30/2019 Date of Consult: 03/30/2019  Primary Care Provider: Bartholome Bill, MD Primary Cardiologist: Peter Martinique, MD  Primary Electrophysiologist:  None    Patient Profile:   Norma [name] is a 73 y.o. female with a hx of coronary artery disease and peripheral arterial disease, end-stage renal disease.  Who is being seen today for the evaluation of atrial fibrillation with rapid ventricular response at the request of Dr. fields.  History of Present Illness:   Norma [name] is a 73 year old female with a history of coronary artery disease and peripheral arterial disease.  She has a history of CABG in June, 2011.  She has had aortobifem bypass for an occluded aorta.  She has pulmonary hypertension and chronic kidney disease.  She presented to the hospital for placement of a dialysis catheter.  She was found to have atrial fib when she was admitted to the short stay center this am . She could not tell that her HR was irregular,  Denies any dyspnea or cp.   Heart Pathway Score:     Past Medical History:  Diagnosis Date  . Anxiety   . Carotid artery occlusion   . CKD (chronic kidney disease) stage 3, GFR 30-59 ml/min (HCC) 04/18/2014  . Claudication ([place])   . Constipation    Due to patient taking iron supplements  . Coronary artery disease   . DDD (degenerative disc disease)   . Diabetes mellitus   . Diabetic coma ([place])   . DJD (degenerative joint disease)   . DJD (degenerative joint disease)   . Edema    Bilateral lower extermities  . Gout   . Hepatitis    Hep C, has been treated with "[place]" - cured per pt  . Hyperlipidemia   . Hypertension   . Iron deficiency anemia   . Leg pain   . Obesity   . PAD (peripheral artery disease) ([place])   . Shortness of breath    exertion    Past Surgical History:  Procedure Laterality Date  . AORTA - BILATERAL FEMORAL ARTERY  BYPASS GRAFT  05/25/2011   Procedure: AORTA BIFEMORAL BYPASS GRAFT;  Surgeon: Mal Misty, MD;  Location: [place];  Service: Vascular;  Laterality: N/A;  . APPLICATION OF WOUND VAC Right 04/23/2016   Procedure: APPLICATION OF WOUND VAC CHANGED;  Surgeon: Meredith Pel, MD;  Location: [place];  Service: Orthopedics;  Laterality: Right;  . AV FISTULA PLACEMENT Left 03/03/2018   Procedure: BRACHIOCEPHALIC ARTERIOVENOUS FISTULA CREATION LEFT ARM;  Surgeon: Elam Dutch, MD;  Location: [place];  Service: Vascular;  Laterality: Left;  . BREAST EXCISIONAL BIOPSY Right   . BREAST SURGERY    . CARDIAC CATHETERIZATION  12/01/2009   EF 65%  . CARDIOVASCULAR STRESS TEST  11/28/2009   EF 75%  . COLONOSCOPY W/ POLYPECTOMY    . CORONARY ARTERY BYPASS GRAFT  12/08/2009   LIMA GRAFT TO THE DISTAL LAD AND SAPHENOUS VEIN GRAFT TO THE OBTUSE MARGINAL VESSEL  . EYE SURGERY    . FISTULA SUPERFICIALIZATION Left 07/28/2018   Procedure: LEFT ARM FISTULA SUPERFICIALIZATION;  Surgeon: Elam Dutch, MD;  Location: [place];  Service: Vascular;  Laterality: Left;  . I&D EXTREMITY Right 04/23/2016   Procedure: IRRIGATION AND DEBRIDEMENT EXTREMITY;  Surgeon: Meredith Pel, MD;  Location: [place];  Service: Orthopedics;  Laterality: Right;  . INCISION AND DRAINAGE HIP Right 04/20/2016  Procedure: IRRIGATION AND DEBRIDEMENT HIP WITH POLY EXCHANGE, ABX BEAD PLACEMENT, WOUND VAC ;  Surgeon: Meredith Pel, MD;  Location: [place];  Service: Orthopedics;  Laterality: Right;  RIGHT HIP SUPERFICIAL I&D, POSSIBLE DEEP I&D, LINER EXCHANGE, ABX BEAD PLACEMENT, WOUND VAC.   Marland Kitchen PR VEIN BYPASS GRAFT,AORTO-FEM-POP  05/25/11  . REMOVAL OF FIBROUS CYST FROM RIGHT BREAST  10+ YEARS  . RETINAL DETACHMENT SURGERY  10+ YEARS   LEFT EYE  . TOTAL HIP ARTHROPLASTY Right 03/06/2016  . TOTAL HIP ARTHROPLASTY Right 03/06/2016   Procedure: RIGHT TOTAL HIP ARTHROPLASTY ANTERIOR APPROACH;  Surgeon: Meredith Pel, MD;  Location: [place];   Service: Orthopedics;  Laterality: Right;  . TRANSTHORACIC ECHOCARDIOGRAM  12/01/2009   EF 60-65%     Home Medications:  Prior to Admission medications   Medication Sig Start Date End Date Taking? Authorizing Provider  acetaminophen (TYLENOL) 500 MG tablet Take 500-1,000 mg by mouth every 6 (six) hours as needed for moderate pain or headache.    Yes [provider]  aspirin EC 81 MG tablet Take 81 mg by mouth daily.   Yes [provider]  calcium acetate (PHOSLO) 667 MG capsule Take 1 capsule (667 mg total) by mouth 3 (three) times daily with meals. 03/08/19  Yes Deno Etienne, DO  carvedilol (COREG) 12.5 MG tablet Take 1 tablet (12.5 mg total) by mouth 2 (two) times daily. Patient taking differently: Take 12.5 mg by mouth See admin instructions. Take 1 tablet (12.5 mg) by mouth once daily in the evenings ONLY on Mondays, Wednesdays, & Fridays. Take 1 tablet (12.5 mg) by mouth twice daily on Sundays, Tuesdays, Thursdays, & Saturdays. 09/23/13  Yes Martinique, Peter M, MD  cloNIDine (CATAPRES) 0.3 MG tablet Take 0.3 mg by mouth 2 (two) times daily. Take 1 tablet (0.3 mg) by mouth once daily in the evenings ONLY on Mondays, Wednesdays, & Fridays. Take 1 tablet (0.3 mg) by mouth twice daily on Sundays, Tuesdays, Thursdays, & Saturdays. 12/30/14  Yes [provider]  furosemide (LASIX) 80 MG tablet Take 160 mg by mouth See admin instructions. Take 2 tablets (160 mg) by mouth twice daily on Sundays,  Tuesdays, Thursdays, & Saturdays.   Yes [provider]  insulin glargine (LANTUS) 100 UNIT/ML injection Inject 0.2 mLs (20 Units total) into the skin 2 (two) times daily. Patient taking differently: Inject 12 Units into the skin daily.  04/25/16  Yes Meredith Pel, MD  ondansetron (ZOFRAN) 4 MG tablet Take 4 mg by mouth every 8 (eight) hours as needed for nausea or vomiting.   Yes [provider]  promethazine (PHENERGAN) 12.5 MG tablet Take 12.5 mg by mouth every  8 (eight) hours as needed for nausea or vomiting.  03/18/18  Yes [provider]  Sennosides 25 MG TABS Take 25 mg by mouth every 3 (three) days.   Yes [provider]  traMADol (ULTRAM) 50 MG tablet Take 50-100 mg by mouth every 8 (eight) hours as needed for pain. 03/09/19  Yes [provider]  glucose blood (PRECISION QID TEST) test strip by Other route Three (3) times a day. 04/08/15   [provider]  HYDROcodone-acetaminophen (NORCO) 5-325 MG tablet Take 1 tablet by mouth every 6 (six) hours as needed for severe pain. Patient not taking: Reported on 03/26/2019 02/19/19   Sherwood Gambler, MD    Inpatient Medications: Scheduled Meds: . ondansetron (ZOFRAN) IV  4 mg Intravenous Once   Continuous Infusions: . sodium chloride Stopped (  03/30/19 1503)  . lactated ringers     PRN Meds: fentaNYL (SUBLIMAZE) injection, meperidine (DEMEROL) injection, midazolam, promethazine  Allergies:    Allergies  Allergen Reactions  . Sulfasalazine     UNSPECIFIED REACTION   . Iohexol Itching, Rash and Other (See Comments)     Code: RASH, Desc: pt called 1 day post scanning stating that skin was red and "itching all over" some what better but still had symptom.. instructed pt to take benadryl to relieve symptoms,per dr Alvester Chou.if any problems call back/mms, Onset Date: [id number]   . Sulfa Antibiotics Other (See Comments)    UNSPECIFIED REACTION     Social History:   Social History   Socioeconomic History  . Marital status: Single    Spouse name: Not on file  . Number of children: 2  . Years of education: Not on file  . Highest education level: Not on file  Occupational History  . 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 Financial resource strain: Not on file  . Food insecurity    Worry: Not on file    Inability: Not on file  . Transportation needs    Medical: Not on file    Non-medical: Not on file  Tobacco Use  .  Smoking status: Former Smoker    Years: 20.00    Types: Cigarettes    Quit date: 07/10/1991    Years since quitting: 27.7  . Smokeless tobacco: Never Used  Substance and Sexual Activity  . Alcohol use: No  . Drug use: No  . Sexual activity: Never  Lifestyle  . Physical activity    Days per week: Not on file    Minutes per session: Not on file  . Stress: Not on file  Relationships  . Social Herbalist on phone: Not on file    Gets together: Not on file    Attends religious service: Not on file    Active member of club or organization: Not on file    Attends meetings of clubs or organizations: Not on file    Relationship status: Not on file  . Intimate partner violence    Fear of current or ex partner: Not on file    Emotionally abused: Not on file    Physically abused: Not on file    Forced sexual activity: Not on file  Other Topics Concern  . Not on file  Social History Narrative  . Not on file    Family History:    Family History  Problem Relation Age of Onset  . Hypertension Mother   . Coronary artery disease Father   . Hypertension Sister   . Cirrhosis Brother   . Kidney disease Daughter   . Breast cancer Neg Hx      ROS:  Please see the history of present illness.   All other ROS reviewed and negative.     Physical Exam/Data:   Vitals:   03/30/19 1147 03/30/19 1530  BP: (!) 144/76 (!) 148/81  Pulse: (!) 102 70  Resp: 18 18  Temp: 97.6 F (36.4 C) (!) 97.1 F (36.2 C)  TempSrc: Oral   SpO2: 100% 100%  Weight: 80.7 kg   Height: 5\' 2"  (1.575 m)     Intake/Output Summary (Last 24 hours) at 03/30/2019 1551 Last data filed at 03/30/2019 1503 Gross per 24 hour  Intake 500 ml  Output 25 ml  Net 475 ml  Last 3 Weights 03/30/2019 03/26/2019 03/08/2019  Weight (lbs) 178 lb 178 lb 178 lb 9.2 oz  Weight (kg) 80.74 kg 80.74 kg 81 kg     Body mass index is 32.56 kg/m.  General:  Chronically ill appearing female.  NAD  HEENT: normal Lymph: no  adenopathy Neck: no JVD Endocrine:  No thryomegaly Vascular: No carotid bruits; FA pulses 2+ bilaterally without bruits  Cardiac:  RR, [id number] systolic murmur ,  Dialysis catheter in her R subclavian  Lungs: rales bilaterally   Abd: soft, nontender, no hepatomegaly  Ext: no edema Musculoskeletal:  No deformities, BUE and BLE strength normal and equal Skin: warm and dry  Neuro:  CNs 2-12 intact, no focal abnormalities noted Psych:  Normal affect   EKG:    Pre-op - atrial fib with V rate of 99.  RBBB    Telemetry:  Telemetry was personally reviewed and demonstrates:  NSR   Relevant CV Studies: \  Laboratory Data:  High Sensitivity Troponin:  No results for input(s): TROPONINIHS in the last 720 hours.   Chemistry Recent Labs  Lab 03/30/19 1213  NA 133*  K 3.7  GLUCOSE 94    No results for input(s): PROT, ALBUMIN, AST, ALT, ALKPHOS, BILITOT in the last 168 hours. Hematology Recent Labs  Lab 03/30/19 1213  HGB 9.9*  HCT 29.0*   BNPNo results for input(s): BNP, PROBNP in the last 168 hours.  DDimer No results for input(s): DDIMER in the last 168 hours.   Radiology/Studies:  Dg Fluoro Guide Cv Line-no Report  Result Date: 03/30/2019 Fluoroscopy was utilized by the requesting physician.  No radiographic interpretation.    Assessment and Plan:   1. Paroxysmal atrial fibrillation: Mrs. Diluzio presents to the hospital today for an elective procedure and was incidentally found to have atrial fibrillation. CHADS2VASC =   4 ( female, age 60, CAD, CHF, )  She converted back to sinus rhythm on her own.  She is completely asymptomatic.  She could not tell that her heart rate was irregular and the rate was not fast.  I think our best option is to start her on Eliquis.  Based on her age, weight and creatinine she will need Eliquis 5 mg twice a day. We will send in a prescription to her pharmacy .  2.  Acute on chronic diastolic CHF: She has some rales on exam.  This might be  because she skipped her morning dose of Lasix for her surgery.  We will give her a dose of IV Lasix now.  She will continue her home dose of Lasix starting tomorrow.  3.  Coronary artery disease: She denies having any episodes of angina.  4.  Chronic kidney disease/end-stage renal disease: Further plans per nephrology.  She had a dialysis catheter placed today.         For questions or updates, please contact [place] Please consult [email or web address] for contact info under     Signed, Mertie Moores, MD  03/30/2019 3:51 PM

## 2019-03-30 NOTE — Interval H&P Note (Signed)
History and Physical Interval Note:  03/30/2019 1:14 PM  Norma [name]  has presented today for surgery, with the diagnosis of STEAL SYNDROME LEFT HAND.  The various methods of treatment have been discussed with the patient and family. After consideration of risks, benefits and other options for treatment, the patient has consented to  Procedure(s): LIGATION OF ARTERIOVENOUS  FISTULA LEFT ARM (Left) INSERTION OF DIALYSIS CATHETER (N/A) as a surgical intervention.  The patient's history has been reviewed, patient examined, no change in status, stable for surgery.  I have reviewed the patient's chart and labs.  Questions were answered to the patient's satisfaction.     Ruta Hinds

## 2019-03-30 NOTE — Discharge Instructions (Signed)
Information on my medicine - ELIQUIS (apixaban)  This medication education was reviewed with me or my healthcare representative as part of my discharge preparation.  The pharmacist that spoke with me during my hospital stay was:  Alaa Mullally Z  Ed Rayson,, RPH  Why was Eliquis prescribed for you? Eliquis was prescribed for you to reduce the risk of forming blood clots that can cause a stroke if you have a medical condition called atrial fibrillation (a type of irregular heartbeat).  What do You need to know about Eliquis ? Take your Eliquis TWICE DAILY - one tablet in the morning and one tablet in the evening with or without food.  It would be best to take the doses about the same time each day.  If you have difficulty swallowing the tablet whole please discuss with your pharmacist how to take the medication safely.  Take Eliquis exactly as prescribed by your doctor and DO NOT stop taking Eliquis without talking to the doctor who prescribed the medication.  Stopping may increase your risk of developing a new clot or stroke.  Refill your prescription before you run out.  After discharge, you should have regular check-up appointments with your healthcare provider that is prescribing your Eliquis.  In the future your dose may need to be changed if your kidney function or weight changes by a significant amount or as you get older.  What do you do if you miss a dose? If you miss a dose, take it as soon as you remember on the same day and resume taking twice daily.  Do not take more than one dose of ELIQUIS at the same time.  Important Safety Information A possible side effect of Eliquis is bleeding. You should call your healthcare provider right away if you experience any of the following: ? Bleeding from an injury or your nose that does not stop. ? Unusual colored urine (red or dark brown) or unusual colored stools (red or black). ? Unusual bruising for unknown reasons. ? A serious fall or if  you hit your head (even if there is no bleeding).  Some medicines may interact with Eliquis and might increase your risk of bleeding or clotting while on Eliquis. To help avoid this, consult your healthcare provider or pharmacist prior to using any new prescription or non-prescription medications, including herbals, vitamins, non-steroidal anti-inflammatory drugs (NSAIDs) and supplements.  This website has more information on Eliquis (apixaban): [email or web address].no.

## 2019-03-31 ENCOUNTER — Other Ambulatory Visit: Payer: Self-pay

## 2019-03-31 ENCOUNTER — Encounter (HOSPITAL_COMMUNITY): Payer: Self-pay | Admitting: Vascular Surgery

## 2019-03-31 ENCOUNTER — Telehealth: Payer: Self-pay | Admitting: Cardiology

## 2019-03-31 ENCOUNTER — Other Ambulatory Visit (HOSPITAL_COMMUNITY)
Admission: RE | Admit: 2019-03-31 | Discharge: 2019-03-31 | Disposition: A | Discharge status: Home / Self Care | Payer: Medicare Other | Source: Other Acute Inpatient Hospital | Attending: Internal Medicine | Admitting: Internal Medicine

## 2019-03-31 ENCOUNTER — Ambulatory Visit: Payer: Medicare Other | Admitting: Adult Health

## 2019-03-31 DIAGNOSIS — L89224 Pressure ulcer of left hip, stage 4: Secondary | ICD-10-CM | POA: Insufficient documentation

## 2019-03-31 DIAGNOSIS — I87333 Chronic venous hypertension (idiopathic) with ulcer and inflammation of bilateral lower extremity: Secondary | ICD-10-CM | POA: Diagnosis not present

## 2019-03-31 MED ORDER — APIXABAN 5 MG PO TABS: 5.0000 mg | ORAL_TABLET | Freq: Two times a day (BID) | ORAL | 3 refills | Status: DC

## 2019-03-31 NOTE — Telephone Encounter (Signed)
Spoke to the patient. It looks like another triage nurse had sent in her RX for Eliquis. Made patient aware that she needs to stop ASA and start Eliquis 5 mg BID. Instructed the patient to avoid NSAIDs and monitor for S/Sx of bleeding. Patient verbalized understanding and thanked me for the call.

## 2019-03-31 NOTE — Telephone Encounter (Signed)
°*  STAT* If patient is at the pharmacy, call can be transferred to refill team.   1. Which medications need to be refilled? (please list name of each medication and dose if known)  Eliquis  2. Which pharmacy/location (including street and city if local pharmacy) is medication to be sent to?  [place], [address]  3. Do they need a 30 day or 90 day supply? 30    Patient had a procedure done by Dr. Eden Lathe [facility] yesterday. At this appointment, she was told that she needs to be on Eliquis or another blood thinner, and Dr. Martinique would send the rx over to her pharmacy. Please verify with Dr. Eden Lathe by contacting the office at [phone]. The patient was getting ready to go to another appointment this afternoon, and did not know when she would be home.

## 2019-03-31 NOTE — Telephone Encounter (Signed)
Left message for patient to call back regarding her Eliquis Rx. Need to verify which pharmacy to send it to.   Per Dr. Acie Fredrickson, patient is to stop ASA and start Eliquis 5 mg BID

## 2019-03-31 NOTE — Telephone Encounter (Signed)
-----   Message from Thayer Headings, MD sent at 03/30/2019  4:48 PM EDT ----- Elsworth Soho,   Norma [name] arrived for her dialysis catheter placement and was found to be in atrial fib  Converted on her own  We are adding Eliquis 5 mg po BID  Script needs to go to [facility], [facility].  I have added to her chart but I cant tell if it will be ordered when Dr. Oneida Alar does the AVS or not  Thanks  Abbe Amsterdam

## 2019-03-31 NOTE — Telephone Encounter (Signed)
Please advise if should be sent in?

## 2019-04-01 ENCOUNTER — Telehealth (HOSPITAL_COMMUNITY): Payer: Self-pay | Admitting: *Deleted

## 2019-04-01 NOTE — Progress Notes (Signed)
Cardiology Office Note   Date:  04/02/2019   ID:  FIDELA UHLENHAKE, DOB Mar 08, 1946, MRN [id number]  PCP:  Bartholome Bill, MD  Cardiologist: Dr. Martinique  No chief complaint on file.    History of Present Illness: Norma [name] is a 73 y.o. female who presents for ongoing assessment and management of CAD with CABG in June 2011, , PAD with Aortobifemoral, pulmonary hypertension,  and ESRD. She was recently seen on consultation by Dr. Acie Fredrickson for atrial fib with RVR on 03/30/2019.   She was to have elective procedure by Dr.Feilds, due to ischemic steal left hand with ligation of the left arm AV fistula, placement of right internal jugular vein with 23 cm palindrome catheter. She  was incidentally found to be in atrial fib. She converted to NSR on her own. She was started on Eliquis 5 mg BID  for CHADS VASC Score of 4. She was also noted to have mild diastolic CHF. She was treated with IV lasix.   She comes today with both of her daughters who are very attentive and care for her at home.  The patient continues to have a lot of soreness and pain in bilateral upper arms due to 2 AV fistula removal, new insertion, along with right upper chest external dialysis catheter placement.  She has not started Eliquis until today and is only taken 1 dose.  There were issues getting her prescription called to the pharmacy and they just picked it up last night.  She continues to have lower extremity dependent edema and also has wound care on bilateral's decubitus ulcers and right lower leg wound.  Labs are followed by dialysis center.  She has dialysis on Mondays Wednesdays and Fridays.  Past Medical History:  Diagnosis Date   Anxiety    Carotid artery occlusion    CKD (chronic kidney disease) stage 3, GFR 30-59 ml/min (HCC) 04/18/2014   Claudication ([place])    Constipation    Due to patient taking iron supplements   Coronary artery disease    DDD (degenerative disc disease)    Diabetes mellitus     Diabetic coma (HCC)    DJD (degenerative joint disease)    DJD (degenerative joint disease)    Edema    Bilateral lower extermities   Gout    Hepatitis    Hep C, has been treated with "Maverick" - cured per pt   Hyperlipidemia    Hypertension    Iron deficiency anemia    Leg pain    Obesity    PAD (peripheral artery disease) ([place])    Shortness of breath    exertion    Past Surgical History:  Procedure Laterality Date   AORTA - BILATERAL FEMORAL ARTERY BYPASS GRAFT  05/25/2011   Procedure: AORTA BIFEMORAL BYPASS GRAFT;  Surgeon: Mal Misty, MD;  Location: [facility] OR;  Service: Vascular;  Laterality: N/A;   APPLICATION OF WOUND VAC Right 04/23/2016   Procedure: APPLICATION OF WOUND VAC CHANGED;  Surgeon: Meredith Pel, MD;  Location: [place];  Service: Orthopedics;  Laterality: Right;   AV FISTULA PLACEMENT Left 03/03/2018   Procedure: BRACHIOCEPHALIC ARTERIOVENOUS FISTULA CREATION LEFT ARM;  Surgeon: Elam Dutch, MD;  Location: [facility];  Service: Vascular;  Laterality: Left;   BREAST EXCISIONAL BIOPSY Right    BREAST SURGERY     CARDIAC CATHETERIZATION  12/01/2009   EF 65%   CARDIOVASCULAR STRESS TEST  11/28/2009   EF 75%   COLONOSCOPY W/ POLYPECTOMY  CORONARY ARTERY BYPASS GRAFT  12/08/2009   LIMA GRAFT TO THE DISTAL LAD AND SAPHENOUS VEIN GRAFT TO THE OBTUSE MARGINAL VESSEL   EYE SURGERY     FISTULA SUPERFICIALIZATION Left 07/28/2018   Procedure: LEFT ARM FISTULA SUPERFICIALIZATION;  Surgeon: Elam Dutch, MD;  Location: [place];  Service: Vascular;  Laterality: Left;   I&D EXTREMITY Right 04/23/2016   Procedure: IRRIGATION AND DEBRIDEMENT EXTREMITY;  Surgeon: Meredith Pel, MD;  Location: [place];  Service: Orthopedics;  Laterality: Right;   INCISION AND DRAINAGE HIP Right 04/20/2016   Procedure: IRRIGATION AND DEBRIDEMENT HIP WITH POLY EXCHANGE, ABX BEAD PLACEMENT, WOUND VAC ;  Surgeon: Meredith Pel, MD;  Location: [place];  Service:  Orthopedics;  Laterality: Right;  RIGHT HIP SUPERFICIAL I&D, POSSIBLE DEEP I&D, LINER EXCHANGE, ABX BEAD PLACEMENT, WOUND VAC.    INSERTION OF DIALYSIS CATHETER Right 03/30/2019   Procedure: INSERTION OF Right Internal Jugular DIALYSIS CATHETER;  Surgeon: Elam Dutch, MD;  Location: [place];  Service: Vascular;  Laterality: Right;   LIGATION OF ARTERIOVENOUS  FISTULA Left 03/30/2019   Procedure: LIGATION OF ARTERIOVENOUS  FISTULA LEFT ARM;  Surgeon: Elam Dutch, MD;  Location: [place];  Service: Vascular;  Laterality: Left;   PR VEIN BYPASS GRAFT,AORTO-FEM-POP  05/25/11   REMOVAL OF FIBROUS CYST FROM RIGHT BREAST  10+ YEARS   RETINAL DETACHMENT SURGERY  10+ YEARS   LEFT EYE   TOTAL HIP ARTHROPLASTY Right 03/06/2016   TOTAL HIP ARTHROPLASTY Right 03/06/2016   Procedure: RIGHT TOTAL HIP ARTHROPLASTY ANTERIOR APPROACH;  Surgeon: Meredith Pel, MD;  Location: [place];  Service: Orthopedics;  Laterality: Right;   TRANSTHORACIC ECHOCARDIOGRAM  12/01/2009   EF 60-65%     Current Outpatient Medications  Medication Sig Dispense Refill   acetaminophen (TYLENOL) 500 MG tablet Take 500-1,000 mg by mouth every 6 (six) hours as needed for moderate pain or headache.      acetaminophen-codeine (TYLENOL #3) 300-30 MG tablet Take 1 tablet by mouth every 4 (four) hours as needed for moderate pain or severe pain. 8 tablet 0   apixaban (ELIQUIS) 5 MG TABS tablet Take 1 tablet (5 mg total) by mouth 2 (two) times daily. 90 tablet 3   calcium acetate (PHOSLO) 667 MG capsule Take 1 capsule (667 mg total) by mouth 3 (three) times daily with meals. 90 capsule 0   carvedilol (COREG) 12.5 MG tablet Take 1 tablet (12.5 mg total) by mouth 2 (two) times daily. (Patient taking differently: Take 12.5 mg by mouth See admin instructions. Take 1 tablet (12.5 mg) by mouth once daily in the evenings ONLY on Mondays, Wednesdays, & Fridays. Take 1 tablet (12.5 mg) by mouth twice daily on Sundays, Tuesdays,  Thursdays, & Saturdays.) 180 tablet 3   cloNIDine (CATAPRES) 0.3 MG tablet Take 0.3 mg by mouth 2 (two) times daily. Take 1 tablet (0.3 mg) by mouth once daily in the evenings ONLY on Mondays, Wednesdays, & Fridays. Take 1 tablet (0.3 mg) by mouth twice daily on Sundays, Tuesdays, Thursdays, & Saturdays.     furosemide (LASIX) 80 MG tablet Take 160 mg by mouth See admin instructions. Take 2 tablets (160 mg) by mouth twice daily on Sundays,  Tuesdays, Thursdays, & Saturdays.     glucose blood (PRECISION QID TEST) test strip by Other route Three (3) times a day.     heparin 1000 UNIT/ML injection Heparin Sodium (Porcine) 1,000 Units/mL Catheter Lock Arterial     HYDROcodone-acetaminophen (NORCO) 5-325 MG  tablet Take 1 tablet by mouth every 6 (six) hours as needed for severe pain. 10 tablet 0   insulin glargine (LANTUS) 100 UNIT/ML injection Inject 0.2 mLs (20 Units total) into the skin 2 (two) times daily. (Patient taking differently: Inject 12 Units into the skin daily. ) 10 mL 11   ondansetron (ZOFRAN) 4 MG tablet Take 4 mg by mouth every 8 (eight) hours as needed for nausea or vomiting.     traMADol (ULTRAM) 50 MG tablet Take 50-100 mg by mouth every 8 (eight) hours as needed for pain.     No current facility-administered medications for this visit.     Allergies:   Carnosine, Sulfasalazine, Iohexol, and Sulfa antibiotics    Social History:  The patient  reports that she quit smoking about 27 years ago. Her smoking use included cigarettes. She quit after 20.00 years of use. She has never used smokeless tobacco. She reports that she does not drink alcohol or use drugs.   Family History:  The patient's family history includes Cirrhosis in her brother; Coronary artery disease in her father; Hypertension in her mother and sister; Kidney disease in her daughter.    ROS: All other systems are reviewed and negative. Unless otherwise mentioned in H&P    PHYSICAL EXAM: VS:  Ht 5\' 2"  (1.575  m)    Wt 178 lb (80.7 kg) Comment: wheelchair   BMI 32.56 kg/m  , BMI Body mass index is 32.56 kg/m. GEN: Well nourished, well developed, in no acute distress HEENT: normal Neck: no JVD, carotid bruits, or masses Cardiac: RRR; no murmurs, rubs, or gallops, 2+ to 3+ pitting dependent edema  Respiratory:  Clear to auscultation bilaterally, normal work of breathing GI: soft, nontender, nondistended MS: Significant kyphosis, pain in the lumbar area with palpation. Skin: warm and dry, no rash, multiple areas of ecchymosis along the upper forearms bilaterally, she also has some severe dark ecchymosis in her left hand especially the left ring finger.  She has a wound to her lower right leg with dressing intact. Neuro:  Strength and sensation are intact Psych: euthymic mood, full affect   EKG: Normal sinus rhythm with sinus arrhythmia, no evidence of atrial fib today.  Right ventricular hypertrophy with T wave flattening noted in the lateral precordial leads.   Recent Labs: 02/19/2019: ALT 10 03/08/2019: BUN 28; Creatinine, Ser 3.30; Platelets 304 03/30/2019: Hemoglobin 9.9; Potassium 3.7; Sodium 133    Lipid Panel    Component Value Date/Time   CHOL 138 04/30/2014 0948   TRIG 169 (H) 04/30/2014 0948   HDL 29 (L) 04/30/2014 0948   CHOLHDL 4.8 04/30/2014 0948   VLDL 34 04/30/2014 0948   LDLCALC 75 04/30/2014 0948      Wt Readings from Last 3 Encounters:  04/02/19 178 lb (80.7 kg)  03/30/19 178 lb (80.7 kg)  03/26/19 178 lb (80.7 kg)      Other studies Reviewed: Echocardiogram 2011-07-17 Left ventricle: The cavity size was normal. Wall thickness  was increased in a pattern of mild LVH. Systolic function  was normal. The estimated ejection fraction was in the  range of 55% to 60%. Wall motion was normal; there were no  regional wall motion abnormalities. Features are  consistent with a pseudonormal left ventricular filling  pattern, with concomitant abnormal  relaxation and  increased filling pressure (grade 2 diastolic  dysfunction).  - Aortic valve: There was very mild stenosis.  - Mitral valve: Calcified annulus.  - Left atrium: The atrium was  mildly dilated.  - Atrial septum: No defect or patent foramen ovale was  identified.  - Pulmonary arteries: PA peak pressure: 103mm Hg (S).   ASSESSMENT AND PLAN:  1.  Paroxysmal atrial fibrillation: Noted during recent procedure for replacement of AV fistula with recannulization.  The patient was placed on Eliquis 5 mg twice daily.  She just started taking it this morning as they had trouble receiving her prescription.  She currently is at baseline.  She does have significant amount of ecchymosis noted in her forearms left hand with mild ecchymosis noted at the right subclavian dialysis catheter insertion site.  I have explained to the patient her ecchymosis may worsen with Eliquis.  They are to report any melena, hemoptysis, or epistaxis.  Dialysis is drawing blood during her sessions.  She is advised on symptoms of anemia.  2.  End-stage renal disease: Followed by nephrology.  Recent new AV fistula placement for Dr.Fields.  3.  Hypertension: Unable to ascertain blood pressures today due to new wounds to her right upper arms above brachial site, as a result of AV fistula removal and replacement.  She will continue on current medication regimen with carvedilol, clonidine, and Lasix as directed.   Current medicines are reviewed at length with the patient today.    Labs/ tests ordered today include: None  Phill Myron. [place] Pugh, ANP, AACC   04/02/2019 12:01 PM    [facility] [address] Office [phone] Fax [phone]

## 2019-04-01 NOTE — Telephone Encounter (Signed)
Left message to schedule appt requested by wound center. EM

## 2019-04-02 ENCOUNTER — Encounter: Payer: Self-pay | Admitting: Adult Health

## 2019-04-02 ENCOUNTER — Telehealth (HOSPITAL_COMMUNITY): Payer: Self-pay | Admitting: *Deleted

## 2019-04-02 ENCOUNTER — Ambulatory Visit (INDEPENDENT_AMBULATORY_CARE_PROVIDER_SITE_OTHER): Payer: Medicare Other | Admitting: Adult Health

## 2019-04-02 ENCOUNTER — Other Ambulatory Visit: Payer: Self-pay

## 2019-04-02 VITALS — HR 70 | Temp 97.7°F | Ht 62.0 in | Wt 178.0 lb

## 2019-04-02 DIAGNOSIS — I10 Essential (primary) hypertension: Secondary | ICD-10-CM

## 2019-04-02 DIAGNOSIS — I480 Paroxysmal atrial fibrillation: Secondary | ICD-10-CM

## 2019-04-02 DIAGNOSIS — N185 Chronic kidney disease, stage 5: Secondary | ICD-10-CM

## 2019-04-02 DIAGNOSIS — N186 End stage renal disease: Secondary | ICD-10-CM

## 2019-04-02 DIAGNOSIS — I700 Atherosclerosis of aorta: Secondary | ICD-10-CM | POA: Diagnosis not present

## 2019-04-02 DIAGNOSIS — Z992 Dependence on renal dialysis: Secondary | ICD-10-CM

## 2019-04-02 LAB — AEROBIC CULTURE W GRAM STAIN (SUPERFICIAL SPECIMEN): Culture: NO GROWTH

## 2019-04-02 MED ORDER — APIXABAN 5 MG PO TABS: 5.0000 mg | ORAL_TABLET | Freq: Two times a day (BID) | ORAL | 3 refills | Status: DC

## 2019-04-02 NOTE — Telephone Encounter (Signed)
Was speaking with patient and we got disconnected when she wanted me to speak with her sister to arrange appt. Tried calling back several times and only getting voicemail. I left a message for her to return my call.

## 2019-04-02 NOTE — Patient Instructions (Signed)
Medication Instructions:  Continue current medications  If you need a refill on your cardiac medications before your next appointment, please call your pharmacy.  Labwork: None ordered   Testing/Procedures: None Ordered  Follow-Up: You will need a follow up appointment in 3 months.  Please call our office 2 months in advance to schedule this appointment.  You may see Peter Martinique, MD or one of the following Advanced Practice Providers on your designated Care Team: [place], [place] . Fabian Sharp, PA-C     At [facility], you and your health needs are our priority.  As part of our continuing mission to provide you with exceptional heart care, we have created designated Provider Care Teams.  These Care Teams include your primary Cardiologist (physician) and Advanced Practice Providers (APPs -  Physician Assistants and Nurse Practitioners) who all work together to provide you with the care you need, when you need it.  Thank you for choosing [facility] at [facility]!!

## 2019-04-06 ENCOUNTER — Telehealth (HOSPITAL_COMMUNITY): Payer: Self-pay | Admitting: *Deleted

## 2019-04-06 NOTE — Telephone Encounter (Signed)
Spoke with pts sister to arrange appt.Earlier appt was offered, chose to schedule on 10/8 to coordiante with pts appt with [facility]

## 2019-04-07 DIAGNOSIS — I87333 Chronic venous hypertension (idiopathic) with ulcer and inflammation of bilateral lower extremity: Secondary | ICD-10-CM | POA: Diagnosis not present

## 2019-04-09 ENCOUNTER — Inpatient Hospital Stay (HOSPITAL_COMMUNITY)
Admission: EM | Admit: 2019-04-09 | Discharge: 2019-04-16 | DRG: 637 | Disposition: A | Discharge status: Hospice | Payer: Medicare Other | Attending: Internal Medicine | Admitting: Internal Medicine

## 2019-04-09 ENCOUNTER — Emergency Department (HOSPITAL_COMMUNITY): Payer: Medicare Other

## 2019-04-09 ENCOUNTER — Encounter (HOSPITAL_COMMUNITY): Payer: Self-pay | Admitting: Emergency Medicine

## 2019-04-09 ENCOUNTER — Other Ambulatory Visit: Payer: Self-pay

## 2019-04-09 DIAGNOSIS — L97929 Non-pressure chronic ulcer of unspecified part of left lower leg with unspecified severity: Secondary | ICD-10-CM

## 2019-04-09 DIAGNOSIS — E1152 Type 2 diabetes mellitus with diabetic peripheral angiopathy with gangrene: Secondary | ICD-10-CM | POA: Diagnosis present

## 2019-04-09 DIAGNOSIS — L89522 Pressure ulcer of left ankle, stage 2: Secondary | ICD-10-CM | POA: Diagnosis present

## 2019-04-09 DIAGNOSIS — I83009 Varicose veins of unspecified lower extremity with ulcer of unspecified site: Secondary | ICD-10-CM

## 2019-04-09 DIAGNOSIS — Z7901 Long term (current) use of anticoagulants: Secondary | ICD-10-CM

## 2019-04-09 DIAGNOSIS — R410 Disorientation, unspecified: Secondary | ICD-10-CM | POA: Diagnosis not present

## 2019-04-09 DIAGNOSIS — L89159 Pressure ulcer of sacral region, unspecified stage: Secondary | ICD-10-CM

## 2019-04-09 DIAGNOSIS — Z951 Presence of aortocoronary bypass graft: Secondary | ICD-10-CM

## 2019-04-09 DIAGNOSIS — E876 Hypokalemia: Secondary | ICD-10-CM | POA: Diagnosis present

## 2019-04-09 DIAGNOSIS — E1165 Type 2 diabetes mellitus with hyperglycemia: Secondary | ICD-10-CM | POA: Diagnosis present

## 2019-04-09 DIAGNOSIS — Z20828 Contact with and (suspected) exposure to other viral communicable diseases: Secondary | ICD-10-CM | POA: Diagnosis present

## 2019-04-09 DIAGNOSIS — D72829 Elevated white blood cell count, unspecified: Secondary | ICD-10-CM | POA: Diagnosis present

## 2019-04-09 DIAGNOSIS — D631 Anemia in chronic kidney disease: Secondary | ICD-10-CM | POA: Diagnosis present

## 2019-04-09 DIAGNOSIS — Z992 Dependence on renal dialysis: Secondary | ICD-10-CM

## 2019-04-09 DIAGNOSIS — E1122 Type 2 diabetes mellitus with diabetic chronic kidney disease: Secondary | ICD-10-CM | POA: Diagnosis present

## 2019-04-09 DIAGNOSIS — I959 Hypotension, unspecified: Secondary | ICD-10-CM | POA: Diagnosis present

## 2019-04-09 DIAGNOSIS — Z79899 Other long term (current) drug therapy: Secondary | ICD-10-CM

## 2019-04-09 DIAGNOSIS — I739 Peripheral vascular disease, unspecified: Secondary | ICD-10-CM

## 2019-04-09 DIAGNOSIS — E162 Hypoglycemia, unspecified: Secondary | ICD-10-CM | POA: Diagnosis present

## 2019-04-09 DIAGNOSIS — M898X9 Other specified disorders of bone, unspecified site: Secondary | ICD-10-CM | POA: Diagnosis present

## 2019-04-09 DIAGNOSIS — E11649 Type 2 diabetes mellitus with hypoglycemia without coma: Principal | ICD-10-CM | POA: Diagnosis present

## 2019-04-09 DIAGNOSIS — N2581 Secondary hyperparathyroidism of renal origin: Secondary | ICD-10-CM | POA: Diagnosis present

## 2019-04-09 DIAGNOSIS — D638 Anemia in other chronic diseases classified elsewhere: Secondary | ICD-10-CM | POA: Diagnosis present

## 2019-04-09 DIAGNOSIS — E872 Acidosis: Secondary | ICD-10-CM | POA: Diagnosis present

## 2019-04-09 DIAGNOSIS — L89890 Pressure ulcer of other site, unstageable: Secondary | ICD-10-CM | POA: Diagnosis present

## 2019-04-09 DIAGNOSIS — E11622 Type 2 diabetes mellitus with other skin ulcer: Secondary | ICD-10-CM | POA: Diagnosis not present

## 2019-04-09 DIAGNOSIS — L89214 Pressure ulcer of right hip, stage 4: Secondary | ICD-10-CM | POA: Diagnosis present

## 2019-04-09 DIAGNOSIS — L89150 Pressure ulcer of sacral region, unstageable: Secondary | ICD-10-CM | POA: Diagnosis present

## 2019-04-09 DIAGNOSIS — N184 Chronic kidney disease, stage 4 (severe): Secondary | ICD-10-CM | POA: Diagnosis present

## 2019-04-09 DIAGNOSIS — M109 Gout, unspecified: Secondary | ICD-10-CM | POA: Diagnosis present

## 2019-04-09 DIAGNOSIS — D649 Anemia, unspecified: Secondary | ICD-10-CM | POA: Diagnosis not present

## 2019-04-09 DIAGNOSIS — Z886 Allergy status to analgesic agent status: Secondary | ICD-10-CM

## 2019-04-09 DIAGNOSIS — I4891 Unspecified atrial fibrillation: Secondary | ICD-10-CM | POA: Diagnosis not present

## 2019-04-09 DIAGNOSIS — L8990 Pressure ulcer of unspecified site, unspecified stage: Secondary | ICD-10-CM

## 2019-04-09 DIAGNOSIS — L89153 Pressure ulcer of sacral region, stage 3: Secondary | ICD-10-CM | POA: Diagnosis present

## 2019-04-09 DIAGNOSIS — Z66 Do not resuscitate: Secondary | ICD-10-CM | POA: Diagnosis not present

## 2019-04-09 DIAGNOSIS — L89224 Pressure ulcer of left hip, stage 4: Secondary | ICD-10-CM | POA: Diagnosis present

## 2019-04-09 DIAGNOSIS — E785 Hyperlipidemia, unspecified: Secondary | ICD-10-CM | POA: Diagnosis present

## 2019-04-09 DIAGNOSIS — Z515 Encounter for palliative care: Secondary | ICD-10-CM | POA: Diagnosis not present

## 2019-04-09 DIAGNOSIS — I10 Essential (primary) hypertension: Secondary | ICD-10-CM | POA: Diagnosis present

## 2019-04-09 DIAGNOSIS — Z79891 Long term (current) use of opiate analgesic: Secondary | ICD-10-CM

## 2019-04-09 DIAGNOSIS — I4819 Other persistent atrial fibrillation: Secondary | ICD-10-CM | POA: Diagnosis present

## 2019-04-09 DIAGNOSIS — L97923 Non-pressure chronic ulcer of unspecified part of left lower leg with necrosis of muscle: Secondary | ICD-10-CM | POA: Diagnosis not present

## 2019-04-09 DIAGNOSIS — M1990 Unspecified osteoarthritis, unspecified site: Secondary | ICD-10-CM | POA: Diagnosis present

## 2019-04-09 DIAGNOSIS — Z8249 Family history of ischemic heart disease and other diseases of the circulatory system: Secondary | ICD-10-CM

## 2019-04-09 DIAGNOSIS — I132 Hypertensive heart and chronic kidney disease with heart failure and with stage 5 chronic kidney disease, or end stage renal disease: Secondary | ICD-10-CM | POA: Diagnosis present

## 2019-04-09 DIAGNOSIS — Z96641 Presence of right artificial hip joint: Secondary | ICD-10-CM | POA: Diagnosis present

## 2019-04-09 DIAGNOSIS — I120 Hypertensive chronic kidney disease with stage 5 chronic kidney disease or end stage renal disease: Secondary | ICD-10-CM | POA: Diagnosis not present

## 2019-04-09 DIAGNOSIS — I2510 Atherosclerotic heart disease of native coronary artery without angina pectoris: Secondary | ICD-10-CM | POA: Diagnosis present

## 2019-04-09 DIAGNOSIS — N186 End stage renal disease: Secondary | ICD-10-CM | POA: Diagnosis present

## 2019-04-09 DIAGNOSIS — I340 Nonrheumatic mitral (valve) insufficiency: Secondary | ICD-10-CM | POA: Diagnosis not present

## 2019-04-09 DIAGNOSIS — L97919 Non-pressure chronic ulcer of unspecified part of right lower leg with unspecified severity: Secondary | ICD-10-CM | POA: Diagnosis not present

## 2019-04-09 DIAGNOSIS — Z794 Long term (current) use of insulin: Secondary | ICD-10-CM

## 2019-04-09 DIAGNOSIS — Z23 Encounter for immunization: Secondary | ICD-10-CM

## 2019-04-09 DIAGNOSIS — I96 Gangrene, not elsewhere classified: Secondary | ICD-10-CM | POA: Diagnosis not present

## 2019-04-09 DIAGNOSIS — I5032 Chronic diastolic (congestive) heart failure: Secondary | ICD-10-CM | POA: Diagnosis present

## 2019-04-09 DIAGNOSIS — L89520 Pressure ulcer of left ankle, unstageable: Secondary | ICD-10-CM | POA: Diagnosis present

## 2019-04-09 DIAGNOSIS — L97913 Non-pressure chronic ulcer of unspecified part of right lower leg with necrosis of muscle: Secondary | ICD-10-CM | POA: Diagnosis not present

## 2019-04-09 DIAGNOSIS — I361 Nonrheumatic tricuspid (valve) insufficiency: Secondary | ICD-10-CM | POA: Diagnosis not present

## 2019-04-09 LAB — CBC WITH DIFFERENTIAL/PLATELET
Abs Immature Granulocytes: 0.15 10*3/uL — ABNORMAL HIGH (ref 0.00–0.07)
Basophils Absolute: 0 10*3/uL (ref 0.0–0.1)
Basophils Relative: 0 %
Eosinophils Absolute: 0 10*3/uL (ref 0.0–0.5)
Eosinophils Relative: 0 %
HCT: 23.5 % — ABNORMAL LOW (ref 36.0–46.0)
Hemoglobin: 6.9 g/dL — CL (ref 12.0–15.0)
Immature Granulocytes: 1 %
Lymphocytes Relative: 3 %
Lymphs Abs: 0.5 10*3/uL — ABNORMAL LOW (ref 0.7–4.0)
MCH: 29.9 pg (ref 26.0–34.0)
MCHC: 29.4 g/dL — ABNORMAL LOW (ref 30.0–36.0)
MCV: 101.7 fL — ABNORMAL HIGH (ref 80.0–100.0)
Monocytes Absolute: 0.7 10*3/uL (ref 0.1–1.0)
Monocytes Relative: 4 %
Neutro Abs: 14.4 10*3/uL — ABNORMAL HIGH (ref 1.7–7.7)
Neutrophils Relative %: 92 %
Platelets: 216 10*3/uL (ref 150–400)
RBC: 2.31 MIL/uL — ABNORMAL LOW (ref 3.87–5.11)
RDW: 22.6 % — ABNORMAL HIGH (ref 11.5–15.5)
WBC: 15.7 10*3/uL — ABNORMAL HIGH (ref 4.0–10.5)
nRBC: 0.2 % (ref 0.0–0.2)

## 2019-04-09 LAB — COMPREHENSIVE METABOLIC PANEL
ALT: 6 U/L (ref 0–44)
AST: 20 U/L (ref 15–41)
Albumin: 1.6 g/dL — ABNORMAL LOW (ref 3.5–5.0)
Alkaline Phosphatase: 83 U/L (ref 38–126)
Anion gap: 11 (ref 5–15)
BUN: 12 mg/dL (ref 8–23)
CO2: 29 mmol/L (ref 22–32)
Calcium: 7.6 mg/dL — ABNORMAL LOW (ref 8.9–10.3)
Chloride: 97 mmol/L — ABNORMAL LOW (ref 98–111)
Creatinine, Ser: 2.32 mg/dL — ABNORMAL HIGH (ref 0.44–1.00)
GFR calc Af Amer: 23 mL/min — ABNORMAL LOW (ref >60)
GFR calc non Af Amer: 20 mL/min — ABNORMAL LOW (ref >60)
Glucose, Bld: 148 mg/dL — ABNORMAL HIGH (ref 70–99)
Potassium: 3.2 mmol/L — ABNORMAL LOW (ref 3.5–5.1)
Sodium: 137 mmol/L (ref 135–145)
Total Bilirubin: 0.8 mg/dL (ref 0.3–1.2)
Total Protein: 4.6 g/dL — ABNORMAL LOW (ref 6.5–8.1)

## 2019-04-09 LAB — CBG MONITORING, ED
Glucose-Capillary: 95 mg/dL (ref 70–99)
Glucose-Capillary: 46 mg/dL — ABNORMAL LOW (ref 70–99)
Glucose-Capillary: 136 mg/dL — ABNORMAL HIGH (ref 70–99)
Glucose-Capillary: 142 mg/dL — ABNORMAL HIGH (ref 70–99)
Glucose-Capillary: 159 mg/dL — ABNORMAL HIGH (ref 70–99)
Glucose-Capillary: 169 mg/dL — ABNORMAL HIGH (ref 70–99)

## 2019-04-09 LAB — SARS CORONAVIRUS 2 BY RT PCR (HOSPITAL ORDER, PERFORMED IN ~~LOC~~ HOSPITAL LAB): SARS Coronavirus 2: NEGATIVE

## 2019-04-09 LAB — LACTIC ACID, PLASMA: Lactic Acid, Venous: 2.2 mmol/L (ref 0.5–1.9)

## 2019-04-09 LAB — BRAIN NATRIURETIC PEPTIDE: B Natriuretic Peptide: >4500 pg/mL — ABNORMAL HIGH (ref 0.0–100.0)

## 2019-04-09 LAB — VITAMIN B12: Vitamin B-12: 1751 pg/mL — ABNORMAL HIGH (ref 180–914)

## 2019-04-09 LAB — FOLATE: Folate: 8.9 ng/mL (ref >5.9)

## 2019-04-09 LAB — HEMOGLOBIN AND HEMATOCRIT, BLOOD
HCT: 30.1 % — ABNORMAL LOW (ref 36.0–46.0)
Hemoglobin: 9.2 g/dL — ABNORMAL LOW (ref 12.0–15.0)

## 2019-04-09 LAB — PREPARE RBC (CROSSMATCH)

## 2019-04-09 MED ORDER — CALCIUM ACETATE (PHOS BINDER) 667 MG PO CAPS
667.0000 mg | ORAL_CAPSULE | Freq: Three times a day (TID) | ORAL | Status: DC
  Administered 2019-04-09 – 2019-04-10 (×2): 667 mg via ORAL
  Filled 2019-04-09 (×4): qty 1

## 2019-04-09 MED ORDER — ACETAMINOPHEN 325 MG PO TABS
650.0000 mg | ORAL_TABLET | Freq: Four times a day (QID) | ORAL | Status: DC | PRN
  Administered 2019-04-13 – 2019-04-15 (×2): 650 mg via ORAL
  Filled 2019-04-09 (×2): qty 2

## 2019-04-09 MED ORDER — VANCOMYCIN HCL IN DEXTROSE 750-5 MG/150ML-% IV SOLN
750.0000 mg | INTRAVENOUS | Status: DC
  Administered 2019-04-11 – 2019-04-13 (×2): 750 mg via INTRAVENOUS
  Filled 2019-04-09 (×4): qty 150

## 2019-04-09 MED ORDER — SODIUM CHLORIDE 0.9 % IV SOLN
2.0000 g | Freq: Once | INTRAVENOUS | Status: AC
  Administered 2019-04-09: 2 g via INTRAVENOUS
  Filled 2019-04-09: qty 2

## 2019-04-09 MED ORDER — SODIUM CHLORIDE 0.9% IV SOLUTION: Freq: Once | INTRAVENOUS | Status: DC

## 2019-04-09 MED ORDER — CARVEDILOL 12.5 MG PO TABS: 12.5000 mg | ORAL_TABLET | ORAL | Status: DC

## 2019-04-09 MED ORDER — SODIUM CHLORIDE 0.9 % IV SOLN
2.0000 g | INTRAVENOUS | Status: DC
  Administered 2019-04-10 – 2019-04-13 (×2): 2 g via INTRAVENOUS
  Filled 2019-04-09 (×3): qty 2

## 2019-04-09 MED ORDER — HYDROCODONE-ACETAMINOPHEN 5-325 MG PO TABS
1.0000 | ORAL_TABLET | Freq: Four times a day (QID) | ORAL | Status: DC | PRN
  Administered 2019-04-09 – 2019-04-16 (×13): 1 via ORAL
  Filled 2019-04-09 (×15): qty 1

## 2019-04-09 MED ORDER — VANCOMYCIN HCL 10 G IV SOLR
1750.0000 mg | Freq: Once | INTRAVENOUS | Status: AC
  Administered 2019-04-09: 1750 mg via INTRAVENOUS
  Filled 2019-04-09: qty 1750

## 2019-04-09 MED ORDER — DEXTROSE 10 % IV SOLN: INTRAVENOUS | Status: DC

## 2019-04-09 MED ORDER — CLONIDINE HCL 0.2 MG PO TABS: 0.3000 mg | ORAL_TABLET | Freq: Two times a day (BID) | ORAL | Status: DC

## 2019-04-09 MED ORDER — DIPHENHYDRAMINE-ZINC ACETATE 2-0.1 % EX CREA
TOPICAL_CREAM | Freq: Two times a day (BID) | CUTANEOUS | Status: DC | PRN
  Filled 2019-04-09: qty 28

## 2019-04-09 MED ORDER — DEXTROSE 50 % IV SOLN
1.0000 | Freq: Once | INTRAVENOUS | Status: AC
  Administered 2019-04-09: 50 mL via INTRAVENOUS
  Filled 2019-04-09: qty 50

## 2019-04-09 MED ORDER — ACETAMINOPHEN 500 MG PO TABS
1000.0000 mg | ORAL_TABLET | Freq: Once | ORAL | Status: AC
  Administered 2019-04-09: 1000 mg via ORAL
  Filled 2019-04-09: qty 2

## 2019-04-09 MED ORDER — COLLAGENASE 250 UNIT/GM EX OINT
1.0000 "application " | TOPICAL_OINTMENT | Freq: Every day | CUTANEOUS | Status: DC
  Administered 2019-04-09: 1 via TOPICAL
  Filled 2019-04-09: qty 30

## 2019-04-09 NOTE — H&P (Signed)
Date: 04/09/2019               Patient Name:  Norma [name] MRN: [id number]  DOB: June 01, 1946 Age / Sex: 73 y.o., female   PCP: Bartholome Bill, MD         Medical Service: Internal Medicine Teaching Service         Attending Physician: Dr. Bartholomew Crews, MD    First Contact: Dr. Charleen Kirks Pager: [phone]  Second Contact: Dr. Myrtie Hawk Pager: [phone]       After Hours (After 5p/  First Contact Pager: [phone]  weekends / holidays): Second Contact Pager: [phone]   Chief Complaint: low blood sugar   History of Present Illness: Norma [name] is a 73 yo F w/ a PMHx notable for ESRD on (MWF) secondary to longstanding poorly controlled hypertension diabetes, diabetes mellitus type 2, HTN, CAD, gout, hepatitis C status post treatment with Maverick, decubitus sacral arthritis, and peripheral artery disease who presents to the ED due to persistent hypoglycemia and altered mental status.  The patient is unable to provide her entire history as she was hypoglycemic and unaware of events that took place.  As such the daughter contributes to her history.  Per the account of the daughter and the patient she was in her usual state of health after dialysis the day prior but had persistent evidence of hypoglycemia over the past several weeks.  They have been unable to determine the etiology.  The patient stated that she had gone to bed without event the prior night but awoke around 2 AM informing her daughter that she felt as if her blood sugar was low. She was given a piece of candy and juice at that time which improved her symptoms and as such she went back to bed. later that morning when she awoke again she did not feel herself and was noted to be confused.  Her CBG was noted to be 26 at that time by her daughter for which EMS was called.  Due to the patient's deconditioning, persistent hypoglycemia, weakness and bleeding sacral ulcers she was brought to the ED.  ED course: There w no overt signs  of sepsis presentation although she has a prominent leukocytosis to 15k. COVD-19 was negative. Blood cultures were obtained. BNP >4500, lactate 2.2, CMP with K+ 3.2, sCR of 2.32.   Meds:  Current Outpatient Medications  Medication Instructions  . acetaminophen (TYLENOL) 500-1,000 mg, Oral, Every 6 hours PRN  . apixaban (ELIQUIS) 5 mg, Oral, 2 times daily  . calcium acetate (PHOSLO) 667 mg, Oral, 3 times daily with meals  . carvedilol (COREG) 12.5 mg, Oral, 2 times daily  . cloNIDine (CATAPRES) 0.3 mg, Oral, 2 times daily, Take 1 tablet (0.3 mg) by mouth once daily in the evenings ONLY on Mondays, Wednesdays, & Fridays.Take 1 tablet (0.3 mg) by mouth twice daily on Sundays, Tuesdays, Thursdays, & Saturdays.  . furosemide (LASIX) 160 mg, Oral, See admin instructions, Take 2 tablets (160 mg) by mouth twice daily on Sundays,  Tuesdays, Thursdays, & Saturdays.  Mar[place] Kitchen glucose blood (PRECISION QID TEST) test strip by Other route Three (3) times a day.  . heparin 1000 UNIT/ML injection Heparin Sodium (Porcine) 1,000 Units/mL Catheter Lock Arterial  . HYDROcodone-acetaminophen (NORCO) 5-325 MG tablet 1 tablet, Oral, Every 6 hours PRN  . insulin glargine (LANTUS) 20 Units, Subcutaneous, 2 times daily  . ondansetron (ZOFRAN) 4 mg, Oral, Every 8 hours PRN  . traMADol (ULTRAM) 50-100 mg, Oral,  Every 8 hours PRN   Allergies: Allergies as of 04/09/2019 - Review Complete 04/09/2019  Allergen Reaction Noted  . Carnosine Other (See Comments) 04/02/2019  . Sulfasalazine  04/28/2014  . Iohexol Itching, Rash, and Other (See Comments) 11/23/2009  . Sulfa antibiotics Other (See Comments) 04/04/2011   Past Medical History:  Diagnosis Date  . Anxiety   . Carotid artery occlusion   . CKD (chronic kidney disease) stage 3, GFR 30-59 ml/min 04/18/2014  . Claudication ([place])   . Constipation    Due to patient taking iron supplements  . Coronary artery disease   . DDD (degenerative disc disease)   . Diabetes  mellitus   . Diabetic coma ([place])   . DJD (degenerative joint disease)   . DJD (degenerative joint disease)   . Edema    Bilateral lower extermities  . Gout   . Hepatitis    Hep C, has been treated with "Maverick" - cured per pt  . Hyperlipidemia   . Hypertension   . Iron deficiency anemia   . Leg pain   . Obesity   . PAD (peripheral artery disease) ([place])   . Shortness of breath    exertion   Family History:  Sister-DMII, HTN Mother-DMII, HTN Sister- Breast cancer  Social History:  Smoking-stopped ~30 years prior but had a 30 pack year history Lives alone, has wound care, daughter, and sisters to assist No pets  Review of Systems: A complete ROS was negative except as per HPI.   Physical Exam: Blood pressure (!) 166/61, pulse 67, temperature 98.2 F (36.8 C), temperature source Rectal, resp. rate 18, height 5\' 2"  (1.575 m), weight 80.7 kg, SpO2 98 %. Physical Exam Constitutional:      General: She is not in acute distress.    Appearance: She is well-developed. She is not diaphoretic.  HENT:     Head: Normocephalic and atraumatic.  Eyes:     Conjunctiva/sclera: Conjunctivae normal.  Neck:     Musculoskeletal: Normal range of motion and neck supple.  Cardiovascular:     Rate and Rhythm: Normal rate and regular rhythm.     Heart sounds: No murmur.  Pulmonary:     Effort: Pulmonary effort is normal. No respiratory distress.     Breath sounds: Normal breath sounds. No stridor. No wheezing.  Abdominal:     General: Bowel sounds are normal. There is no distension.     Palpations: Abdomen is soft.     Tenderness: There is no abdominal tenderness.  Musculoskeletal:        General: Tenderness, deformity and signs of injury (Sacral-bilateral lower extremity ulceration) present.  Skin:    General: Skin is warm.     Capillary Refill: Capillary refill takes less than 2 seconds.  Neurological:     Mental Status: She is alert and oriented to person, place, and time.      Motor: Weakness (Bilateral lower extremities) present.    EKG: personally reviewed my interpretation is atrial fibrillation, regular rate  CXR: personally reviewed my interpretation is enlarged cardiac [place]   Assessment & Plan by Problem: Principal Problem:   Hypoglycemia Active Problems:   ESRD (end stage renal disease) ([place])   Sacral decubitus ulcer   Venous stasis ulcer ([place])  Assessment: Norma [name] is a 73 yo F w/ a PMHx notable for ESRD on (MWF) secondary to longstanding poorly controlled hypertension diabetes, diabetes mellitus type 2, HTN, CAD, gout, hepatitis C status post treatment with Maverick, decubitus sacral arthritis, and  peripheral artery disease who presents to the ED due to persistent hypoglycemia and altered mental status.  Patient found to have persistent hypoglycemia likely secondary to combination of her ESRD and Lantus.  In addition she has multiple wounds due to her non-mobility status, anemia to 6.9, and is an ESRD patient.  We are holding her insulin, continuing glucose infusion as needed. I have consulted nephrology and she will likely need skilled nursing Foley placement prior to discharge.  Plan: Hypoglycemia: Would like secondary to decreased requirement for insulin in the setting of ESRD.  The patient endorsed consistently taking her 12 units of insulin every morning.  I doubt she has an insulinoma but would consider this is no improvement in the next 6 to 10 days. - Continue D10 infusion - Consider D50 pushes as needed low of D10 - Discontinue all insulin at this time - Monitor CBGs every 4 hours - Will initiate SSI sensitive for hypoglycemia as indicated - Renal diet ordered  ESRD: Patient has a prior history of ESRD most likely secondary to poorly controlled diabetes and her hypertension.  She also underwent dialysis on 04/08/2019, reportedly for a full session. -Appreciate nephrology's assistance with his patient, I have contacted Dr. Jonnie Finner  -Continue PhosLo -Renal panel in a.m.  Atrial fibrillation: Patient remains in atrial fibrillation on exam today but remains hemodynamically stable.  Given patient's persistent atrial fibrillation, generalized weakness and fatigue, dyspnea and markedly elevated BNP to greater than 4500 we will assess her cardiac function with an echocardiogram. - We will hold her Eliquis due to her ongoing blood loss - Continue carvedilol 12.5 mg twice daily - Echocardiogram ordered  Anemia: 6.9 on admission down from 9.9 10 days prior to 21st September.  This is likely acute on chronic severity of blood loss given her ESRD as her baseline appears to be anywhere from 7.2-9.2. - Ordered unit PRBCs to be transfused with post H&H to be ordered by nursing -CBC in a.m.  Sacral decubitus ulcers: Patient is reportedly following with an outpatient wound care facility, but I am unable to see any notes from this.  Her bleeding decubitus ulcers likely the source of her anemia.  She does have a prominent leukocytosis but no other signs of acute infection including being afebrile.  We will withhold antibiotics at this time as not currently clinically indicated. - Consult placed to wound care for assessment, grading and treatment recommendations - Low threshold to start vancomycin for possible osteomyelitis given the severity of her decubitus ulcers, would also consider gram-negative coverage given their location -Continue hydrocodone acetaminophen 5/325mg  every 6 hours as needed  HTN: Patient blood pressure remains elevated at 136/61 despite having completed HD the prior day.  We will continue her clonidine carvedilol at this time.  Would recommend considering an additional agent to her PCP on discharge if she remains hypertensive -Continue clonidine 0.5 mg twice daily -Continue carvedilol 12 5 mg twice daily  Venous Stasis Ulcer: Bilateral, and very stages of healing, will be seen by wound care for this as well.   Diet: Renal Code: Full DVT PPX: None due to anemia Dispo: Admit patient to Inpatient with expected length of stay greater than 2 midnights.  Signed: Kathi Ludwig, MD 04/09/2019, 4:11 PM  Pager: # [phone]

## 2019-04-09 NOTE — Consult Note (Addendum)
[facility] Nurse wound consult note Reason for Consult: Consult requested for bilat ischium and right leg wounds.  [facility] team is working remotely today; reviewed photos and progress notes in the EMR from a recent admission .  Pt has extensive areas of eschar to all these locations and was previously  in the ER on 8/13.  She was scheduled to be seen out the outpatient wound care center, but I was unable to locate any notes from the visit that was scheduled for 9/22.  Wound type: Unstageable pressure injuries Pressure Injury POA: Yes Dressing procedure/placement/frequency: [place] treatment orders provided for beside nurses to perform daily to assist with enzymatic debridement until further recommendations are available from the surgical team.:  Apply Santyl to left and right ischium and right leg wounds Q day, and cover with moist gauze dressings.  Recommend surgical consult for possible surgical debridement.  Secure chat message sent to the primary physician. Please re-consult if further assistance is needed.  Thank-you,  Julien Girt MSN, [place], [place], [place], [place]

## 2019-04-09 NOTE — Progress Notes (Signed)
Pharmacy Antibiotic Note  Norma [name] is a 73 y.o. female admitted on 04/09/2019 with hypoglycemia. Pharmacy has been consulted for vancomycin and cefepime dosing for sepsis.  Noted patient has sacral decubitus ulcer with possible osteo.  She also has ESRD on MWF HD, last session on 04/08/19.  Afebrile, WBC 15.7.  Current weight is 80.7 kg; however, pBNP is elevated.  Anticipate weight will trend down.  Plan: Vanc 1750mg  IV x 1, then 750mg  qHD MWF Cefepime 2gm IV qHD MWF Monitor HD schedule/tolerance, weight, clinical progress, vanc level as indicated  Height: 5\' 2"  (157.5 cm) Weight: 177 lb 14.6 oz (80.7 kg) IBW/kg (Calculated) : 50.1  Temp (24hrs), Avg:97.9 F (36.6 C), Min:97.7 F (36.5 C), Max:98.2 F (36.8 C)  Recent Labs  Lab 04/09/19 1000 04/09/19 1008  WBC  --  15.7*  CREATININE  --  2.32*  LATICACIDVEN 2.2*  --     Estimated Creatinine Clearance: 21.2 mL/min (A) (by C-G formula based on SCr of 2.32 mg/dL (H)).    Allergies  Allergen Reactions  . Carnosine Other (See Comments)  . Sulfasalazine     UNSPECIFIED REACTION   . Iohexol Itching, Rash and Other (See Comments)     Code: RASH, Desc: pt called 1 day post scanning stating that skin was red and "itching all over" some what better but still had symptom.. instructed pt to take benadryl to relieve symptoms,per dr Alvester Chou.if any problems call back/mms, Onset Date: [id number]   . Sulfa Antibiotics Other (See Comments)    UNSPECIFIED REACTION     Vanc 10/1 >> Cefepime 10/1 >>  10/1 covid - negative 10/1 UCx -  10/1 BCx -   [name] D. Mina Marble, PharmD, BCPS, [place] 04/09/2019, 10:06 PM

## 2019-04-09 NOTE — ED Notes (Signed)
Pt CBG was 46, notified Kelsey(PA) & Joyce(RN)

## 2019-04-09 NOTE — ED Triage Notes (Signed)
Patient arrived via EMS from home. Family called after taking patients blood sugar and being unable to get numbers up. Initial CBG 38. EMS provided an amp of dextrose. Per EMS, CBG 150 after tx. Patient alert and oriented.

## 2019-04-09 NOTE — ED Notes (Signed)
Admitting Physician Paged. MD verbalized pausing the D10 order to administer blood with continuing Q1 hour CBG checks.

## 2019-04-09 NOTE — ED Notes (Signed)
ED Provider at bedside. 

## 2019-04-09 NOTE — ED Notes (Signed)
Dinner tray ordered.

## 2019-04-09 NOTE — Progress Notes (Signed)
Patient noted to be hypotensive down to 85/39, was currently on a maintenance fluids drip.  Went to evaluate patient at bedside.  Patient was awake and alert, she was reporting significant bilateral lower extremity pain and was requesting an anti-itch medication be applied to her legs.  She denied any lightheadedness, dizziness, fatigue, headaches, chest pain, or shortness of breath.  Daughter was at bedside.  She does not have a known history of hypotension home.  She is typically hypertensive and is on antihypertensive medications at home.  On exam patient is awake, alert, no acute distress.  Cardiac exam showed a regular rate and rhythm, no murmurs rubs or gallops.  Pulmonary exam CTA BL, no wheezing, rhonchi, rales.  Right lower extremity showed open wounds, no diffuse bleeding noted. Pulses 2+ throughout. Blood pressure cuff on right forearm.   Hypotension: Blood pressure significantly low down to the 80s over 30s, however patient is alert and awake with no symptoms so unclear if this is accurate. This is decreased from her prior readings earlier today. LA was elevated to 2.2. WBC was elevated to 15.  Antibiotics were initially held however given her new hypotension and possible source of infection given the severity of her decubitus ulcers will start antibiotics.  Patient is currently on maintenance fluids at 125 cc/h. Repeat BP improved to 98/78. She received her pRBCs.  -Start Vancomycin and cefepime -Continue maintenance fluids -Hold clonidine, carvedilol -F/u Blood cultures  -F/u urinalysis -F/u urine culture -Follow-up H&H

## 2019-04-09 NOTE — ED Notes (Signed)
Pt CBG was 95, notified Joyce(RN)

## 2019-04-09 NOTE — ED Notes (Signed)
EDP made aware of critical lab. 

## 2019-04-09 NOTE — ED Provider Notes (Signed)
[facility] EMERGENCY DEPARTMENT Provider Note   CSN: [id number] Arrival date & time: 04/09/19  0946     History   Chief Complaint Chief Complaint  Patient presents with   Hypoglycemia    HPI Norma [name] is a 73 y.o. female.     Norma [name] is a 73 y.o. female history of ESRD on HD, diabetes, A. fib, peripheral arterial disease, hypertension, hyperlipidemia, hepatitis, and chronic decubitus ulcers, who presents to the emergency department via EMS for evaluation of hypoglycemia.  Patient reports that she was laying in bed and she woke up to EMS evaluating her.  She lives with her daughter who is her primary caretaker, patient is not able to do much on her own anymore.  Her daughter woke up and noted that she seemed slumped over and not right, checked her blood sugar and it was 21, attempted to give her orange juice, but would only get blood pressure improved to 38 and called EMS.  On arrival EMS gave an amp of D50 and got blood sugar to improve to 150.  Daughter reports patient last had insulin yesterday.  Patient had reported feeling like her blood sugar was getting low last night, daughter checked it and it was 56 but she ate a candy and drink orange juice and was feeling better, so went to bed.  Patient denies having any recent fevers, no chest pain, shortness of breath or cough.  She denies any abdominal pain, nausea, vomiting or diarrhea.  She does still make a small amount of urine, denies any dysuria or urinary frequency.  Last had dialysis yesterday, completed entire session with no complications.  She recently had her left AV fistula ligated due to ischemic changes to the left hand with dry gangrene of the distal portion of the left index finger, Dr. Oneida Alar with vascular surgery placed a external jugular dialysis catheter which patient reports has been working well without complication.  She has not noticed any redness warmth or pain over dialysis catheter.  She  has had some bruising over her extremities, is currently on Eliquis for A. fib.  She denies any headaches, numbness or weakness.  She has a large bilateral decubitus ulcers over the buttocks which she has been following for wound care with at [facility], last seen 2 days ago, at this time they were not concerned for infection, a few weeks ago she had a wound biopsy done which did not show infection. Daughter reports some increased bleeding from wounds.  Patient's daughter who is her primary caretaker is at bedside, very concerned about her mom, but reports that she is having a hard time taking care of her full-time at home, wound care doctor recommended patient start seeking placement at skilled nursing facility given the level of care she is requiring.  PCP started NCFL2 paperwork 2 days ago.  Daughter tearful reporting that she is a dialysis patient herself and she is afraid that she is not going to be able to take care of herself let alone her mom.     Past Medical History:  Diagnosis Date   Anxiety    Carotid artery occlusion    CKD (chronic kidney disease) stage 3, GFR 30-59 ml/min 04/18/2014   Claudication ([place])    Constipation    Due to patient taking iron supplements   Coronary artery disease    DDD (degenerative disc disease)    Diabetes mellitus    Diabetic coma ([place])    DJD (  degenerative joint disease)    DJD (degenerative joint disease)    Edema    Bilateral lower extermities   Gout    Hepatitis    Hep C, has been treated with "M[place]ick" - cured per pt   Hyperlipidemia    Hypertension    Iron deficiency anemia    Leg pain    Obesity    PAD (peripheral artery disease) (HCC)    Shortness of breath    exertion    Patient Active Problem List   Diagnosis Date Noted   Hypoglycemia 04/09/2019   ESRD (end stage renal disease) ([place]) 04/09/2019   Sacral decubitus ulcer 04/09/2019   Venous stasis ulcer ([place]) 04/09/2019   Anemia of chronic disease  08/27/2016   Presence of right artificial knee joint 04/30/2016   Postoperative wound infection of right hip 04/24/2016   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/20/2016   Degenerative arthritis of hip 03/06/2016   Gout of left knee 10/31/2015   Right hip pain 10/29/2015   Acute pain of left knee 10/29/2015   DM type 2 causing CKD stage 3 ([place]) 10/29/2015   Morbid obesity ([place]) 10/29/2015   CKD (chronic kidney disease) stage 4, GFR 15-29 ml/min (HCC) 04/18/2014   Aortic atherosclerosis ([place]) 04/18/2014   Bilateral carotid bruits 04/16/2014   Pulsatile tinnitus of right ear 04/16/2014   Diarrhea 09/18/2011   Peripheral vascular disease, unspecified ([place]) 08/14/2011   Claudication ([place]) 08/14/2011   Edema 06/29/2011   Leucocytosis 06/12/2011   Acute on chronic kidney disease, stage 4 05/28/2011   Respiratory failure, acute ([place]) 05/25/2011    Class: Acute   S/P aorto-bifemoral bypass surgery 05/25/2011    Class: Acute   PAD (peripheral artery disease) ([place])    Coronary artery disease    Hyperlipidemia    Hypertension     Past Surgical History:  Procedure Laterality Date   AORTA - BILATERAL FEMORAL ARTERY BYPASS GRAFT  05/25/2011   Procedure: AORTA BIFEMORAL BYPASS GRAFT;  Surgeon: Mal Misty, MD;  Location: [facility];  Service: Vascular;  Laterality: N/A;   APPLICATION OF WOUND VAC Right 04/23/2016   Procedure: APPLICATION OF WOUND VAC CHANGED;  Surgeon: Meredith Pel, MD;  Location: [facility];  Service: Orthopedics;  Laterality: Right;   AV FISTULA PLACEMENT Left 03/03/2018   Procedure: BRACHIOCEPHALIC ARTERIOVENOUS FISTULA CREATION LEFT ARM;  Surgeon: Elam Dutch, MD;  Location: [facility];  Service: Vascular;  Laterality: Left;   BREAST EXCISIONAL BIOPSY Right    BREAST SURGERY     CARDIAC CATHETERIZATION  12/01/2009   EF 65%   CARDIOVASCULAR STRESS TEST  11/28/2009   EF 75%   COLONOSCOPY W/ POLYPECTOMY     CORONARY ARTERY BYPASS GRAFT   12/08/2009   LIMA GRAFT TO THE DISTAL LAD AND SAPHENOUS VEIN GRAFT TO THE OBTUSE MARGINAL VESSEL   EYE SURGERY     FISTULA SUPERFICIALIZATION Left 07/28/2018   Procedure: LEFT ARM FISTULA SUPERFICIALIZATION;  Surgeon: Elam Dutch, MD;  Location: [place];  Service: Vascular;  Laterality: Left;   I&D EXTREMITY Right 04/23/2016   Procedure: IRRIGATION AND DEBRIDEMENT EXTREMITY;  Surgeon: Meredith Pel, MD;  Location: [facility];  Service: Orthopedics;  Laterality: Right;   INCISION AND DRAINAGE HIP Right 04/20/2016   Procedure: IRRIGATION AND DEBRIDEMENT HIP WITH POLY EXCHANGE, ABX BEAD PLACEMENT, WOUND VAC ;  Surgeon: Meredith Pel, MD;  Location: [facility] [place];  Service: Orthopedics;  Laterality: Right;  RIGHT HIP SUPERFICIAL I&D, POSSIBLE DEEP I&D, LINER EXCHANGE,  ABX BEAD PLACEMENT, WOUND VAC.    INSERTION OF DIALYSIS CATHETER Right 03/30/2019   Procedure: INSERTION OF Right Internal Jugular DIALYSIS CATHETER;  Surgeon: Elam Dutch, MD;  Location: [facility];  Service: Vascular;  Laterality: Right;   LIGATION OF ARTERIOVENOUS  FISTULA Left 03/30/2019   Procedure: LIGATION OF ARTERIOVENOUS  FISTULA LEFT ARM;  Surgeon: Elam Dutch, MD;  Location: [facility];  Service: Vascular;  Laterality: Left;   PR VEIN BYPASS GRAFT,AORTO-FEM-POP  05/25/11   REMOVAL OF FIBROUS CYST FROM RIGHT BREAST  10+ YEARS   RETINAL DETACHMENT SURGERY  10+ YEARS   LEFT EYE   TOTAL HIP ARTHROPLASTY Right 03/06/2016   TOTAL HIP ARTHROPLASTY Right 03/06/2016   Procedure: RIGHT TOTAL HIP ARTHROPLASTY ANTERIOR APPROACH;  Surgeon: Meredith Pel, MD;  Location: [facility];  Service: Orthopedics;  Laterality: Right;   TRANSTHORACIC ECHOCARDIOGRAM  12/01/2009   EF 60-65%     OB History   No obstetric history on file.      Home Medications    Prior to Admission medications   Medication Sig Start Date End Date Taking? Authorizing Provider  acetaminophen (TYLENOL) 500 MG tablet Take 500 mg by mouth 2 (two) times  daily.    Yes [provider]  apixaban (ELIQUIS) 5 MG TABS tablet Take 1 tablet (5 mg total) by mouth 2 (two) times daily. 04/02/19  Yes Lendon Colonel, NP  calcium acetate (PHOSLO) 667 MG capsule Take 1 capsule (667 mg total) by mouth 3 (three) times daily with meals. 03/08/19  Yes Deno Etienne, DO  carvedilol (COREG) 12.5 MG tablet Take 1 tablet (12.5 mg total) by mouth 2 (two) times daily. Patient taking differently: Take 12.5 mg by mouth See admin instructions. Take 1 tablet (12.5 mg) by mouth once daily in the evenings ONLY on Mondays, Wednesdays, & Fridays. Take 1 tablet (12.5 mg) by mouth twice daily on Sundays, Tuesdays, Thursdays, & Saturdays. 09/23/13  Yes [place]ique, Peter M, MD  furosemide (LASIX) 80 MG tablet Take 160 mg by mouth See admin instructions. Take 2 tablets (160 mg) by mouth twice daily on Sundays,  Tuesdays, Thursdays, & Saturdays.   Yes [provider]  HYDROcodone-acetaminophen (NORCO) 5-325 MG tablet Take 1 tablet by mouth every 6 (six) hours as needed for severe pain. 02/19/19  Yes Sherwood Gambler, MD  insulin glargine (LANTUS) 100 UNIT/ML injection Inject 0.2 mLs (20 Units total) into the skin 2 (two) times daily. Patient taking differently: Inject 12 Units into the skin daily.  04/25/16  Yes Meredith Pel, MD  Multiple Vitamin (MULTIVITAMIN WITH MINERALS) TABS tablet Take 1 tablet by mouth every evening.   Yes [provider]  ondansetron (ZOFRAN) 4 MG tablet Take 4 mg by mouth every 8 (eight) hours as needed for nausea or vomiting.   Yes [provider]  traMADol (ULTRAM) 50 MG tablet Take 50-100 mg by mouth every 8 (eight) hours as needed for pain. 03/09/19  Yes [provider]    Family History Family History  Problem Relation Age of Onset   Hypertension Mother    Coronary artery disease Father    Hypertension Sister    Cirrhosis Brother    Kidney disease Daughter    Breast cancer Neg Hx     Social  History Social History   Tobacco Use   Smoking status: Former Smoker    Years: 20.00    Types: Cigarettes    Quit date: 07/10/1991    Years since quitting:  27.7   Smokeless tobacco: Never Used  Substance Use Topics   Alcohol use: No   Drug use: No     Allergies   Carnosine, Sulfasalazine, Iohexol, and Sulfa antibiotics   Review of Systems Review of Systems  Constitutional: Negative for chills and fever.  HENT: Negative.   Respiratory: Negative for cough and shortness of breath.   Cardiovascular: Negative for chest pain.  Gastrointestinal: Negative for abdominal pain, diarrhea, nausea and vomiting.  Genitourinary: Negative for dysuria and frequency.  Skin: Positive for wound.  Neurological: Negative for dizziness, weakness, light-headedness, numbness and headaches.  All other systems reviewed and are negative.    Physical Exam Updated Vital Signs BP (!) 111/24    Pulse 75    Temp 97.9 F (36.6 C) (Oral)    Resp 16    Ht 5\' 2"  (1.575 m)    Wt 80.7 kg    SpO2 99%    BMI 32.54 kg/m   Physical Exam Vitals signs and nursing note reviewed.  Constitutional:      General: She is not in acute distress.    Appearance: Normal appearance. She is well-developed. She is not diaphoretic.     Comments: Elderly, chronically ill-appearing female, in no acute distress  HENT:     Head: Normocephalic and atraumatic.     Mouth/Throat:     Mouth: Mucous membranes are moist.     Pharynx: Oropharynx is clear.  Eyes:     General:        Right eye: No discharge.        Left eye: No discharge.     Pupils: Pupils are equal, round, and reactive to light.  Neck:     Musculoskeletal: Neck supple.  Cardiovascular:     Rate and Rhythm: Normal rate. Rhythm irregular.     Heart sounds: Normal heart sounds. No murmur. No friction rub. No gallop.      Comments: Regularly irregular rhythm, rate controlled Pulmonary:     Effort: Pulmonary effort is normal. No respiratory distress.     Breath  sounds: Rales present. No wheezing.     Comments: Respirations equal and unlabored, no tachypnea or respiratory distress, patient able to speak in full sentences.  On auscultation patient with some rales bilaterally, but good air movement. Abdominal:     General: Bowel sounds are normal. There is no distension.     Palpations: Abdomen is soft. There is no mass.     Tenderness: There is no abdominal tenderness. There is no guarding.     Comments: Abdomen soft, nondistended, nontender to palpation in all quadrants without guarding or peritoneal signs  Musculoskeletal:        General: Deformity present.     Comments: Large sacral decubitus ulcers extending to each buttock with some bleeding present. There are also venous stasis ulcers to bilateral lower legs.  No bleeding.  2+ pitting edema to bilateral lower extremities up to the midshin Dry gangrene of the distal tip of the left index finger with skin discoloration throughout the finger, 2+ radial pulse.  Skin:    General: Skin is warm and dry.     Capillary Refill: Capillary refill takes less than 2 seconds.  Neurological:     Mental Status: She is alert.     Coordination: Coordination normal.     Comments: Speech is clear, able to follow commands Moves extremities without ataxia, coordination intact   Psychiatric:        Mood and Affect:  Mood normal.        Behavior: Behavior normal.      ED Treatments / Results  Labs (all labs ordered are listed, but only abnormal results are displayed) Labs Reviewed  COMPREHENSIVE METABOLIC PANEL - Abnormal; Notable for the following components:      Result Value   Potassium 3.2 (*)    Chloride 97 (*)    Glucose, Bld 148 (*)    Creatinine, Ser 2.32 (*)    Calcium 7.6 (*)    Total Protein 4.6 (*)    Albumin 1.6 (*)    GFR calc non Af Amer 20 (*)    GFR calc Af Amer 23 (*)    All other components within normal limits  LACTIC ACID, PLASMA - Abnormal; Notable for the following components:    Lactic Acid, Venous 2.2 (*)    All other components within normal limits  CBC WITH DIFFERENTIAL/PLATELET - Abnormal; Notable for the following components:   WBC 15.7 (*)    RBC 2.31 (*)    Hemoglobin 6.9 (*)    HCT 23.5 (*)    MCV 101.7 (*)    MCHC 29.4 (*)    RDW 22.6 (*)    Neutro Abs 14.4 (*)    Lymphs Abs 0.5 (*)    Abs Immature Granulocytes 0.15 (*)    All other components within normal limits  BRAIN NATRIURETIC PEPTIDE - Abnormal; Notable for the following components:   B Natriuretic Peptide >4,500.0 (*)    All other components within normal limits  CBG MONITORING, ED - Abnormal; Notable for the following components:   Glucose-Capillary 46 (*)    All other components within normal limits  CBG MONITORING, ED - Abnormal; Notable for the following components:   Glucose-Capillary 136 (*)    All other components within normal limits  CBG MONITORING, ED - Abnormal; Notable for the following components:   Glucose-Capillary 142 (*)    All other components within normal limits  CBG MONITORING, ED - Abnormal; Notable for the following components:   Glucose-Capillary 159 (*)    All other components within normal limits  SARS CORONAVIRUS 2 (HOSPITAL ORDER, [facility])  CULTURE, BLOOD (ROUTINE X 2)  CULTURE, BLOOD (ROUTINE X 2)  URINE CULTURE  LACTIC ACID, PLASMA  URINALYSIS, ROUTINE W REFLEX MICROSCOPIC  CBG MONITORING, ED  TYPE AND SCREEN  PREPARE RBC (CROSSMATCH)    EKG EKG Interpretation  Date/Time:  Thursday April 09 2019 10:07:26 EDT Ventricular Rate:  78 PR Interval:    QRS Duration: 104 QT Interval:  610 QTC Calculation: 696 R Axis:   130 Text Interpretation:  Atrial fibrillation Probable right ventricular hypertrophy Abnormal T, consider ischemia, diffuse leads Prolonged QT interval Confirmed by Davonna Belling [phone]) on 04/09/2019 11:20:27 AM   Radiology Dg Pelvis 1-2 Views  Result Date: 04/09/2019 CLINICAL DATA:  Decubitus  ulcer, assess for osteomyelitis EXAM: PELVIS - 1-2 VIEW COMPARISON:  Hip radiograph 03/06/2016 FINDINGS: Total right hip arthroplasty. Advanced left hip osteoarthritis with joint narrowing. Generalized osteopenia. Gas, stool, pelvic calcifications, structure overlap, and osteopenia are all limiting factors in detecting osteomyelitis. IMPRESSION: 1. This modality is insufficient for detecting osteomyelitis. MRI or CT without contrast (less sensitive) would be needed to further assess. 2. Advanced left hip osteoarthritis. Electronically Signed   By: Monte Fantasia M.D.   On: 04/09/2019 11:07   Dg Chest Port 1 View  Result Date: 04/09/2019 CLINICAL DATA:  Decreased blood sugar. Hypoglycemia. Evaluate for infection.  EXAM: PORTABLE CHEST 1 VIEW COMPARISON:  03/30/2019 FINDINGS: Right chest wall port a catheter is identified with tip in the projection of the cavoatrial junction. Previous median sternotomy and CABG procedure. Cardiac enlargement. Aortic atherosclerotic calcifications. Bilateral pleural effusions with pulmonary vascular congestion. IMPRESSION: 1. Cardiac enlargement and mild CHF. 2.  Aortic Atherosclerosis (ICD10-I70.0). Electronically Signed   By: Kerby Moors M.D.   On: 04/09/2019 10:31   Dg Hand Complete Left  Result Date: 04/09/2019 CLINICAL DATA:  Gangrenous left index finger EXAM: LEFT HAND - COMPLETE 3+ VIEW COMPARISON:  None. FINDINGS: No fracture or dislocation of the left hand. Joint spaces are preserved. There is extensive vascular calcinosis about the hand and wrist. There is no specific radiographic soft tissue abnormality of the index digit. IMPRESSION: No fracture or dislocation of the left hand. Joint spaces are preserved. There is extensive vascular calcinosis about the hand and wrist. There is no specific radiographic soft tissue abnormality of the index digit. Electronically Signed   By: Eddie Candle M.D.   On: 04/09/2019 11:07    Procedures .Critical Care Performed by:  Jacqlyn Larsen, PA-C Authorized by: Jacqlyn Larsen, PA-C   Critical care provider statement:    Critical care time (minutes):  45   Critical care was necessary to treat or prevent imminent or life-threatening deterioration of the following conditions:  Metabolic crisis (Hypoglycemia)   Critical care was time spent personally by me on the following activities:  Discussions with consultants, evaluation of patient's response to treatment, examination of patient, ordering and performing treatments and interventions, ordering and review of laboratory studies, ordering and review of radiographic studies, pulse oximetry, re-evaluation of patient's condition, obtaining history from patient or surrogate and review of old charts   (including critical care time)  Medications Ordered in ED Medications  dextrose 10 % infusion (0 mL/hr Intravenous Paused 04/09/19 1312)  collagenase (SANTYL) ointment (has no administration in time range)  0.9 %  sodium chloride infusion (Manually program via Guardrails IV Fluids) (has no administration in time range)  calcium acetate (PHOSLO) capsule 667 mg (has no administration in time range)  carvedilol (COREG) tablet 12.5 mg (has no administration in time range)  cloNIDine (CATAPRES) tablet 0.3 mg (has no administration in time range)  HYDROcodone-acetaminophen (NORCO/VICODIN) 5-325 MG per tablet 1 tablet (has no administration in time range)  dextrose 50 % solution 50 mL (50 mLs Intravenous Given 04/09/19 1209)  acetaminophen (TYLENOL) tablet 1,000 mg (1,000 mg Oral Given 04/09/19 1417)     Initial Impression / Assessment and Plan / ED Course  I have reviewed the triage vital signs and the nursing notes.  Pertinent labs & imaging results that were available during my care of the patient were reviewed by me and considered in my medical decision making (see chart for details).  73 year old female with complex med history presenting with hypoglycemia, prior D50 from  EMS, sugar stable on arrival.  Has been taking 12 units of insulin every morning, patient with ESRD on hemodialysis, and I suspect the combination of these 2 is likely the cause of her hypoglycemia will also assess for sources of infection or other potential causes for hypo-rival patient is afebrile and vitals are normal, stable chronic hypertension.  Lungs with some mild rales, but no focal rhonchi, no productive cough.  Abdominal exam is benign.  Patient does have severe sacral decubitus ulcers as well as venous stasis ulcers to the lower extremities but these do not appear to be acutely infected  followed closely by wound care, but unable to see these notes in our system.  Lab evaluation significant for leukocytosis of 15.7, but remains afebrile will assess for source of infection but will hold off on starting antibiotics at this time, blood cultures collected and pending.  Hemoglobin of 6.9, patient with chronic anemia with baseline of 7.2- 9.9, certainly could be a worsening of chronic anemia related to CKD, the patient is having some blood loss from ulcers. Will defer to admitting team regarding transfusion.   Patient with mild hypokalemia, I expect this to increase with ESRD will hold off on potassium replacement.  Creatinine of 2.32, normal BUN.  Calcium of 7.6.  LFTs.  Lactic acid mildly elevated at 2.2.  Chest x-ray shows cardiomegaly with some pulmonary vascular congestion, consistent with rales heard on exam, will check BNP.  Notified by nursing staff on glucose recheck, sugar has dropped to 46, will give additional amp of D50 and start patient on D10 infusion.  Given significant decubitus ulcers, x-ray of the pelvis ordered to assess for any obvious osteomyelitis, none seen but if high concern would consider MRI during admission.  Patient also with dry gangrene of the distal tip of the left index finger secondary to ischemia from left AV fistula, which has since been ligated.  X-ray shows  significant vascular calcinosis throughout the left index finger and hand but no specific soft tissue or bony abnormality.  Suspect that patient may require amputation of some portion of the left index finger.  Patient with recurrent hypoglycemia, as well as other metabolic derangements, low hemoglobin and leukocytosis, will certainly require admission and will ultimately benefit from placement at a skilled nursing facility.  Consult placed for unassigned medical admission, case discussed with Dr. Berline Lopes with internal medicine teaching service who will see and admit the patient.   Final Clinical Impressions(s) / ED Diagnoses   Final diagnoses:  Hypoglycemia  Hypokalemia  Hypocalcemia  Pressure injury of skin, unspecified injury stage, unspecified location    ED Discharge Orders    None       Janet Berlin 04/09/19 Delorse Limber, MD 04/17/19 [phone]

## 2019-04-09 NOTE — ED Notes (Addendum)
CBG 143  

## 2019-04-09 NOTE — ED Notes (Signed)
Pt transferred to hospital bed. This RN offered to take black transport sheet out from underneath her. Pt refused d/t it being uncomfortable to move.

## 2019-04-10 ENCOUNTER — Inpatient Hospital Stay (HOSPITAL_COMMUNITY): Payer: Medicare Other

## 2019-04-10 DIAGNOSIS — D631 Anemia in chronic kidney disease: Secondary | ICD-10-CM

## 2019-04-10 DIAGNOSIS — I4891 Unspecified atrial fibrillation: Secondary | ICD-10-CM

## 2019-04-10 DIAGNOSIS — M1612 Unilateral primary osteoarthritis, left hip: Secondary | ICD-10-CM

## 2019-04-10 DIAGNOSIS — Z7901 Long term (current) use of anticoagulants: Secondary | ICD-10-CM

## 2019-04-10 DIAGNOSIS — E1122 Type 2 diabetes mellitus with diabetic chronic kidney disease: Secondary | ICD-10-CM

## 2019-04-10 DIAGNOSIS — I361 Nonrheumatic tricuspid (valve) insufficiency: Secondary | ICD-10-CM

## 2019-04-10 DIAGNOSIS — I96 Gangrene, not elsewhere classified: Secondary | ICD-10-CM

## 2019-04-10 DIAGNOSIS — Z794 Long term (current) use of insulin: Secondary | ICD-10-CM

## 2019-04-10 DIAGNOSIS — R110 Nausea: Secondary | ICD-10-CM

## 2019-04-10 DIAGNOSIS — I120 Hypertensive chronic kidney disease with stage 5 chronic kidney disease or end stage renal disease: Secondary | ICD-10-CM

## 2019-04-10 DIAGNOSIS — N186 End stage renal disease: Secondary | ICD-10-CM

## 2019-04-10 DIAGNOSIS — L97919 Non-pressure chronic ulcer of unspecified part of right lower leg with unspecified severity: Secondary | ICD-10-CM

## 2019-04-10 DIAGNOSIS — I959 Hypotension, unspecified: Secondary | ICD-10-CM

## 2019-04-10 DIAGNOSIS — L97929 Non-pressure chronic ulcer of unspecified part of left lower leg with unspecified severity: Secondary | ICD-10-CM

## 2019-04-10 DIAGNOSIS — E11649 Type 2 diabetes mellitus with hypoglycemia without coma: Principal | ICD-10-CM

## 2019-04-10 DIAGNOSIS — E11622 Type 2 diabetes mellitus with other skin ulcer: Secondary | ICD-10-CM

## 2019-04-10 DIAGNOSIS — Z992 Dependence on renal dialysis: Secondary | ICD-10-CM

## 2019-04-10 DIAGNOSIS — K5900 Constipation, unspecified: Secondary | ICD-10-CM

## 2019-04-10 DIAGNOSIS — G8929 Other chronic pain: Secondary | ICD-10-CM

## 2019-04-10 DIAGNOSIS — E871 Hypo-osmolality and hyponatremia: Secondary | ICD-10-CM

## 2019-04-10 DIAGNOSIS — Z9889 Other specified postprocedural states: Secondary | ICD-10-CM

## 2019-04-10 DIAGNOSIS — Z79899 Other long term (current) drug therapy: Secondary | ICD-10-CM

## 2019-04-10 DIAGNOSIS — L89159 Pressure ulcer of sacral region, unspecified stage: Secondary | ICD-10-CM

## 2019-04-10 DIAGNOSIS — I340 Nonrheumatic mitral (valve) insufficiency: Secondary | ICD-10-CM

## 2019-04-10 DIAGNOSIS — M79606 Pain in leg, unspecified: Secondary | ICD-10-CM

## 2019-04-10 DIAGNOSIS — E1152 Type 2 diabetes mellitus with diabetic peripheral angiopathy with gangrene: Secondary | ICD-10-CM

## 2019-04-10 LAB — RENAL FUNCTION PANEL
Albumin: 1.7 g/dL — ABNORMAL LOW (ref 3.5–5.0)
Anion gap: 13 (ref 5–15)
BUN: 19 mg/dL (ref 8–23)
CO2: 26 mmol/L (ref 22–32)
Calcium: 7.8 mg/dL — ABNORMAL LOW (ref 8.9–10.3)
Chloride: 95 mmol/L — ABNORMAL LOW (ref 98–111)
Creatinine, Ser: 2.8 mg/dL — ABNORMAL HIGH (ref 0.44–1.00)
GFR calc Af Amer: 19 mL/min — ABNORMAL LOW (ref >60)
GFR calc non Af Amer: 16 mL/min — ABNORMAL LOW (ref >60)
Glucose, Bld: 166 mg/dL — ABNORMAL HIGH (ref 70–99)
Phosphorus: 1.6 mg/dL — ABNORMAL LOW (ref 2.5–4.6)
Potassium: 3.8 mmol/L (ref 3.5–5.1)
Sodium: 134 mmol/L — ABNORMAL LOW (ref 135–145)

## 2019-04-10 LAB — CBC
HCT: 29.6 % — ABNORMAL LOW (ref 36.0–46.0)
Hemoglobin: 9.3 g/dL — ABNORMAL LOW (ref 12.0–15.0)
MCH: 30.7 pg (ref 26.0–34.0)
MCHC: 31.4 g/dL (ref 30.0–36.0)
MCV: 97.7 fL (ref 80.0–100.0)
Platelets: 209 10*3/uL (ref 150–400)
RBC: 3.03 MIL/uL — ABNORMAL LOW (ref 3.87–5.11)
RDW: 21.3 % — ABNORMAL HIGH (ref 11.5–15.5)
WBC: 15.6 10*3/uL — ABNORMAL HIGH (ref 4.0–10.5)
nRBC: 0.3 % — ABNORMAL HIGH (ref 0.0–0.2)

## 2019-04-10 LAB — TYPE AND SCREEN
ABO/RH(D): A POS
Antibody Screen: NEGATIVE
Unit division: 0

## 2019-04-10 LAB — ECHOCARDIOGRAM COMPLETE
Height: 62 in
Weight: 2846.58 oz

## 2019-04-10 LAB — GLUCOSE, CAPILLARY
Glucose-Capillary: 143 mg/dL — ABNORMAL HIGH (ref 70–99)
Glucose-Capillary: 224 mg/dL — ABNORMAL HIGH (ref 70–99)
Glucose-Capillary: 163 mg/dL — ABNORMAL HIGH (ref 70–99)
Glucose-Capillary: 155 mg/dL — ABNORMAL HIGH (ref 70–99)

## 2019-04-10 LAB — BPAM RBC
Blood Product Expiration Date: 202011012359
ISSUE DATE / TIME: 202010011643
Unit Type and Rh: 6200

## 2019-04-10 LAB — CBG MONITORING, ED: Glucose-Capillary: 142 mg/dL — ABNORMAL HIGH (ref 70–99)

## 2019-04-10 MED ORDER — CHLORHEXIDINE GLUCONATE CLOTH 2 % EX PADS: 6.0000 | MEDICATED_PAD | Freq: Every day | CUTANEOUS | Status: DC

## 2019-04-10 MED ORDER — CHLORHEXIDINE GLUCONATE CLOTH 2 % EX PADS
6.0000 | MEDICATED_PAD | Freq: Every day | CUTANEOUS | Status: DC
  Administered 2019-04-11 – 2019-04-16 (×6): 6 via TOPICAL

## 2019-04-10 MED ORDER — DARBEPOETIN ALFA 200 MCG/0.4ML IJ SOSY
200.0000 ug | PREFILLED_SYRINGE | INTRAMUSCULAR | Status: DC
  Administered 2019-04-11: 200 ug via INTRAVENOUS

## 2019-04-10 MED ORDER — DAKINS (1/4 STRENGTH) 0.125 % EX SOLN
Freq: Two times a day (BID) | CUTANEOUS | Status: AC
  Administered 2019-04-10 – 2019-04-12 (×6): via TOPICAL
  Filled 2019-04-10 (×3): qty 473

## 2019-04-10 MED ORDER — TRAMADOL HCL 50 MG PO TABS: 50.0000 mg | ORAL_TABLET | Freq: Once | ORAL | Status: DC | PRN

## 2019-04-10 MED ORDER — DAKINS (1/4 STRENGTH) 0.125 % EX SOLN
Freq: Two times a day (BID) | CUTANEOUS | Status: AC
  Administered 2019-04-10 – 2019-04-12 (×5): via TOPICAL
  Filled 2019-04-10: qty 473

## 2019-04-10 MED ORDER — DAKINS (1/4 STRENGTH) 0.125 % EX SOLN
Freq: Two times a day (BID) | CUTANEOUS | Status: AC
  Administered 2019-04-10 – 2019-04-12 (×5): via TOPICAL
  Filled 2019-04-10: qty 473

## 2019-04-10 MED ORDER — DAKINS (1/4 STRENGTH) 0.125 % EX SOLN
Freq: Two times a day (BID) | CUTANEOUS | Status: AC
  Administered 2019-04-10 – 2019-04-12 (×6): via TOPICAL
  Filled 2019-04-10: qty 473

## 2019-04-10 MED ORDER — INFLUENZA VAC A&B SA ADJ QUAD 0.5 ML IM PRSY
0.5000 mL | PREFILLED_SYRINGE | INTRAMUSCULAR | Status: AC
  Administered 2019-04-13: 09:00:00 0.5 mL via INTRAMUSCULAR
  Filled 2019-04-10: qty 0.5

## 2019-04-10 MED ORDER — K PHOS MONO-SOD PHOS DI & MONO 155-852-130 MG PO TABS
250.0000 mg | ORAL_TABLET | Freq: Two times a day (BID) | ORAL | Status: AC
  Administered 2019-04-10 – 2019-04-13 (×5): 250 mg via ORAL
  Filled 2019-04-10 (×7): qty 1

## 2019-04-10 MED ORDER — DAKINS (1/4 STRENGTH) 0.125 % EX SOLN
Freq: Two times a day (BID) | CUTANEOUS | Status: AC
  Administered 2019-04-10: 1 via TOPICAL
  Administered 2019-04-10 – 2019-04-12 (×5): via TOPICAL
  Filled 2019-04-10: qty 473

## 2019-04-10 MED ORDER — DAKINS (1/4 STRENGTH) 0.125 % EX SOLN
Freq: Two times a day (BID) | CUTANEOUS | Status: AC
  Administered 2019-04-10 – 2019-04-12 (×6): via TOPICAL
  Filled 2019-04-10: qty 473

## 2019-04-10 MED ORDER — ONDANSETRON HCL 4 MG/2ML IJ SOLN
4.0000 mg | Freq: Two times a day (BID) | INTRAMUSCULAR | Status: DC | PRN
  Administered 2019-04-10 – 2019-04-13 (×3): 4 mg via INTRAVENOUS
  Filled 2019-04-10 (×3): qty 2

## 2019-04-10 MED ORDER — DAKINS (1/4 STRENGTH) 0.125 % EX SOLN
Freq: Two times a day (BID) | CUTANEOUS | Status: AC
  Administered 2019-04-10 – 2019-04-12 (×6): via TOPICAL
  Filled 2019-04-10: qty 473

## 2019-04-10 MED ORDER — DARBEPOETIN ALFA 200 MCG/0.4ML IJ SOSY
200.0000 ug | PREFILLED_SYRINGE | INTRAMUSCULAR | Status: DC
  Filled 2019-04-10: qty 0.4

## 2019-04-10 MED ORDER — TRAMADOL HCL 50 MG PO TABS
50.0000 mg | ORAL_TABLET | Freq: Two times a day (BID) | ORAL | Status: AC | PRN
  Administered 2019-04-10: 50 mg via ORAL
  Filled 2019-04-10: qty 1

## 2019-04-10 MED ORDER — ONDANSETRON HCL 4 MG/2ML IJ SOLN: 4.0000 mg | Freq: Two times a day (BID) | INTRAMUSCULAR | Status: DC

## 2019-04-10 NOTE — ED Notes (Signed)
Report given to 4E RN. All questions answered 

## 2019-04-10 NOTE — Progress Notes (Signed)
Patient admitted with extensive wounds and pressure injuries. Measurements and staging by WOC. Patient states she was being followed by wound care. She is wheelchair bound. Lives alone with sisters taking turns staying overnight. States two sisters get her into wheelchair. Patient aware she will need to go to a facility for more care. Pt resting with call bell within reach.  Will continue to monitor.

## 2019-04-10 NOTE — Progress Notes (Signed)
Date: 04/10/2019  Patient name: Norma [name]  Medical record number: [id number]  Date of birth: 1946-02-28   I have seen and evaluated Norma [name] and discussed their care with the Residency Team.  Norma [name] is a 73 year old community dwelling woman with chronic wounds and end-stage renal disease on hemodialysis who is cared for by her daughter.  She presented for hypoglycemia which has responded to holding her Lantus and a D10 infusion.  Additionally, she has numerous painful wounds and has been seen by outpatient wound care but there are notes are not available.  Additionally, the daughter does not feel that she can provide the necessary care and is inquiring about SNF for her mother.  Overnight, the patient became hypotensive with a systolic blood pressure in the 80s.  Her only complaint at that time was chronic leg pain.  Lactic acid level was elevated at 2.2 and WBC elevated to 15.  Sepsis was felt to be high enough to warrant antibiotics and broad-spectrum antibiotics were started with vancomycin and cefepime.  Her antihypertensives were held and source investigations were started.  This morning, the patient complains of pain associated with the wounds.  The daughter was at the bedside and was updated on her mom's treatment plan.  Vitals:   04/10/19 0730 04/10/19 0748  BP: (!) 89/59 110/78  Pulse: (!) 44 93  Resp: 16 19  Temp:    SpO2: 98% 96%  T-max 97.9 General elderly, no acute distress H irr irr no MRG LCTAB with good airflow Skin exam showed eschar-like wound on right posterior calf.  Her left fourth finger is black consistent with dry gangrene.  The right upper thigh has a very large home with discharge.  The left groin skin fold is macerated, erythematous with discharge.  There are other numerous wounds as documented by wound care.  Albumin 1.7 Calcium 7.8 Phosphorus 1.6 Lactic acid 2.2 WBC 15.6 Hemoglobin stable at 9.3  Left hand plain film shows vascular calcinosis  of the hand and wrist but no bony abnormalities  Pelvis plain films showed generalized osteopenia, advanced left hip osteoarthritis, no changes of osteo-myelitis  I personally viewed the CXR images and confirmed my reading with the official read.  1 view AP, rotated, sternal wires, right chest wall Port-A-Cath, aortic atherosclerosis, pulmonary vascular congestion.  I personally viewed the EKG and confirmed my reading with the official read.  A. fib, RAD, no ischemic changes.  Assessment and Plan: I have seen and evaluated the patient as outlined above. I agree with the formulated Assessment and Plan as detailed in the residents' note, with the following changes: Norma [name] is a 72 year old community dwelling woman with chronic wounds and end-stage renal disease on hemodialysis who is cared for by her daughter.  She presents for hypoglycemia which has resolved, numerous concerning wounds, hypotension with lactic acidosis, and inability to remain under her daughter's care due to increased healthcare needs.  1.  Hypoglycemia -induced by Lantus use and end-stage renal disease.  A1c levels will be an accurate in her but I would be comfortable with higher glucose levels corresponding to an A1c of 8.  We are holding her Lantus and using sliding scale insulin for now.  2.  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  If these are representative of calciphylaxis, and even if they are not,  her prognosis is poor and I would encourage a goals of care conversation.  Appreciate wound care consult recommendations.  We will reach out to her outpatient wound doctor before proceeding with MRI to evaluate for osteomyelitis or consulting surgery for left fourth finger amputation.  3.  Hypotension concerning for sepsis -continue vancomycin and cefepime and follow-up  culture data results.  If no source is found, I would stop and observe.  The hypotension occurred when she had euglycemia.  4.  Atrial fibrillation -she is bradycardic despite holding her beta-blocker.  Her Eliquis is also been held as her wounds are bleeding.  5.  Anemia of chronic disease -her hemoglobin on admission was 6.9 and she was transfused 1 unit PRBCs.  Her posttransfusion hemoglobin has been 9.2 followed by a 9.3.  Therefore, the 6.9 was likely a false low.  Continue Aranesp  6. ESRD -appreciate nephrology's input.  Bartholomew Crews, MD 10/2/20204:03 PM

## 2019-04-10 NOTE — Progress Notes (Signed)
  Echocardiogram 2D Echocardiogram has been performed.  Norma [name] 04/10/2019, 9:02 AM

## 2019-04-10 NOTE — ED Notes (Signed)
SDU  Ordered breakfast

## 2019-04-10 NOTE — ED Notes (Signed)
Pt's BP checked again and positioned differently on right arm. Higher reading obtained. Gave pt pain meds and got pt comfortable in bed by removing all sheets/mat from underneath her

## 2019-04-10 NOTE — NC FL2 (Signed)
[place] MEDICAID FL2 LEVEL OF CARE SCREENING TOOL     IDENTIFICATION  Patient Name: Norma [name] Birthdate: [birth year] Sex: female Admission Date (Current Location): 04/09/2019  [facility] and [place] Number:  Herbalist and Address:  [facility] [place], [place] [address] [place], [place] [address]      Provider Number: [id number]  Attending Physician Name and Address:  Bartholomew Crews, MD  Relative Name and Phone Number:  Dan Europe daughter, [phone]    Current Level of Care: Hospital Recommended Level of Care: [place] Prior Approval Number:    Date Approved/Denied:   PASRR Number: [id number]  Discharge Plan: SNF    Current Diagnoses: Patient Active Problem List   Diagnosis Date Noted  . Hypoglycemia 04/09/2019  . ESRD (end stage renal disease) ([place]) 04/09/2019  . Sacral decubitus ulcer 04/09/2019  . Venous stasis ulcer ([place]) 04/09/2019  . Anemia of chronic disease 08/27/2016  . Presence of right artificial knee joint 04/30/2016  . Postoperative wound infection of right hip 04/24/2016  . 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 Degenerative arthritis of hip 03/06/2016  . Gout of left knee 10/31/2015  . Right hip pain 10/29/2015  . Acute pain of left knee 10/29/2015  . DM type 2 causing CKD stage 3 ([place]) 10/29/2015  . Morbid obesity ([place]) 10/29/2015  . CKD (chronic kidney disease) stage 4, GFR 15-29 ml/min (HCC) 04/18/2014  . Aortic atherosclerosis ([place]) 04/18/2014  . Bilateral carotid bruits 04/16/2014  . Pulsatile tinnitus of right ear 04/16/2014  . Diarrhea 09/18/2011  . Peripheral vascular disease, unspecified ([place]) 08/14/2011  . Claudication ([place]) 08/14/2011  . Edema 06/29/2011  . Leucocytosis 06/12/2011  . Acute on chronic kidney disease, stage 4 05/28/2011  . Respiratory failure, acute ([place]) 05/25/2011    Class: Acute  . S/P aorto-bifemoral bypass surgery 05/25/2011    Class: Acute  . PAD  (peripheral artery disease) ([place])   . Coronary artery disease   . Hyperlipidemia   . Hypertension     Orientation RESPIRATION BLADDER Height & Weight     Self, Time, Situation, Place  Normal Incontinent Weight: 177 lb 14.6 oz (80.7 kg) Height:  5\' 2"  (157.5 cm)  BEHAVIORAL SYMPTOMS/MOOD NEUROLOGICAL BOWEL NUTRITION STATUS      Incontinent Diet(Please see DC Summary)  AMBULATORY STATUS COMMUNICATION OF NEEDS Skin   Extensive Assist Verbally Surgical wounds(Incision on arm and chest)                       Personal Care Assistance Level of Assistance  Bathing, Feeding, Dressing Bathing Assistance: Maximum assistance Feeding assistance: Limited assistance Dressing Assistance: Limited assistance     Functional Limitations Info  Sight, Speech Sight Info: Adequate Hearing Info: Adequate Speech Info: Adequate    SPECIAL CARE FACTORS FREQUENCY  PT (By licensed PT), OT (By licensed OT)     PT Frequency: 5x OT Frequency: 5x            Contractures Contractures Info: Not present    Additional Factors Info  Code Status, Allergies Code Status Info: Full Allergies Info: Carnosine, Sulfasalazine, Iohexol, Sulfa Antibiotics           Current Medications (04/10/2019):  This is the current hospital active medication list Current Facility-Administered Medications  Medication Dose Route Frequency Provider Last Rate Last Dose  . 0.9 %  sodium chloride infusion (Manually program via Guardrails IV Fluids)   Intravenous Once Kathi Ludwig, MD  Stopped at 04/09/19 1944  . acetaminophen (TYLENOL) tablet 650 mg  650 mg Oral Q6H PRN Asencion Noble, MD      . ceFEPIme (MAXIPIME) 2 g in sodium chloride 0.9 % 100 mL IVPB  2 g Intravenous Q M,W,F-2000 Dang, Thuy D, RPH      . Chlorhexidine Gluconate Cloth 2 % PADS 6 each  6 each Topical Daily Bartholomew Crews, MD      . Derrill Memo ON 04/11/2019] Darbepoetin Alfa (ARANESP) injection 200 mcg  200 mcg Intravenous Q Sat-HD  Zeyfang, David, PA-C      . diphenhydrAMINE-zinc acetate (BENADRYL) 2-0.1 % cream   Topical BID PRN Asencion Noble, MD      . HYDROcodone-acetaminophen (NORCO/VICODIN) 5-325 MG per tablet 1 tablet  1 tablet Oral Q6H PRN Kathi Ludwig, MD   1 tablet at 04/10/19 0932  . [START ON 04/11/2019] influenza vaccine adjuvanted (FLUAD) injection 0.5 mL  0.5 mL Intramuscular Tomorrow-1000 Bartholomew Crews, MD      . ondansetron [facility]) injection 4 mg  4 mg Intravenous BID PRN Masoudi, Elhamalsadat, MD   4 mg at 04/10/19 1048  . phosphorus (K PHOS NEUTRAL) tablet 250 mg  250 mg Oral BID Zeyfang, David, PA-C      . sodium hypochlorite (DAKIN'S 1/4 STRENGTH) topical solution   Topical BID Bartholomew Crews, MD      . sodium hypochlorite (DAKIN'S 1/4 STRENGTH) topical solution   Topical BID Bartholomew Crews, MD      . sodium hypochlorite (DAKIN'S 1/4 STRENGTH) topical solution   Topical BID Bartholomew Crews, MD      . sodium hypochlorite (DAKIN'S 1/4 STRENGTH) topical solution   Topical BID Bartholomew Crews, MD      . sodium hypochlorite (DAKIN'S 1/4 STRENGTH) topical solution   Topical BID Bartholomew Crews, MD      . sodium hypochlorite (DAKIN'S 1/4 STRENGTH) topical solution   Topical BID Bartholomew Crews, MD      . sodium hypochlorite (DAKIN'S 1/4 STRENGTH) topical solution   Topical BID Bartholomew Crews, MD      . traMADol Veatrice Bourbon) tablet 50 mg  50 mg Oral Q12H PRN Jose Persia, MD   50 mg at 04/10/19 1356  . vancomycin (VANCOCIN) IVPB 750 mg/150 ml premix  750 mg Intravenous Q M,W,F-HD Dang, Thuy D, [facility]         Discharge Medications: Please see discharge summary for a list of discharge medications.  Relevant Imaging Results:  Relevant Lab Results:   Additional Information SSN: [id number]          COVID negative on 10/1  Benard Halsted, LCSW

## 2019-04-10 NOTE — Evaluation (Signed)
Physical Therapy Evaluation Patient Details Name: Norma [name] MRN: [id number] DOB: 04-22-1946 Today's Date: 04/10/2019   History of Present Illness  Norma [name] is a 73 yo F w/ a PMHx notable for ESRD on (MWF) secondary to longstanding poorly controlled hypertension diabetes, diabetes mellitus type 2, HTN, CAD, gout, hepatitis C status post treatment with [place], decubitus sacral arthritis, and peripheral artery disease who presents to the ED due to persistent hypoglycemia and altered mental status    Clinical Impression  Pt has been home alone some during the days - but family staying with her and they were doing +2 lift transfers to get her in and out of wheelchair.  Pt was going to HD and wound center via [facility].  HH was coming to see her.  Pt knows she needs higher level of care -she was looking into going to [facility].  Pt now with 8/10 low back pain - she didn't want to move so did bed eval at this time.  I recommend SNF level care for pt.  PT will follow while in hospital as pt allows.    Follow Up Recommendations SNF;Supervision/Assistance - 24 hour    Equipment Recommendations  None recommended by PT    Recommendations for Other Services       Precautions / Restrictions Precautions Precautions: Fall Precaution Comments: pt has multiple sores - heels floating Restrictions Weight Bearing Restrictions: No      Mobility  Bed Mobility               General bed mobility comments: talked to pt about rolling side to side for skin care - necessary to change positions f requently - had pt pull on rail to show her how to help adjust  Transfers                 General transfer comment: pt hurting and didnt want to move out of bed  Ambulation/Gait             General Gait Details: unable - hasnt been abel to do this in a long time  Stairs            Wheelchair Mobility    Modified Rankin (Stroke Patients Only)       Balance                                              Pertinent Vitals/Pain Pain Assessment: 0-10 Pain Score: 8  Pain Location: low back Pain Descriptors / Indicators: Burning;Constant;Crushing;Discomfort;Grimacing;Guarding;Sharp;Sore Pain Intervention(s): Limited activity within patient's tolerance(bed eval only)    Home Living Family/patient expects to be discharged to:: Skilled nursing facility                 Additional Comments: pt has been living alone with sister and daughter he lping    Prior Function Level of Independence: Needs assistance   Gait / Transfers Assistance Needed: Pt reports she was transfer level only - it was taking 2 people to lift her up and get her into chair     Comments: 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  Pt going to dialysis and wound center weekly via [facility]     Hand Dominance        Extremity/Trunk Assessment   Upper Extremity Assessment Upper Extremity Assessment: Defer to OT evaluation  Lower Extremity Assessment Lower Extremity Assessment: Generalized weakness(legs tested while supine - 2-/5 grossly)    Cervical / Trunk Assessment Cervical / Trunk Assessment: Kyphotic  Communication   Communication: No difficulties  Cognition Arousal/Alertness: Awake/alert Behavior During Therapy: WFL for tasks assessed/performed Overall Cognitive Status: Within Functional Limits for tasks assessed                                        General Comments General comments (skin integrity, edema, etc.): focused today on skin integrity and positioning - floating heels, shifting weight side to side etc    Exercises     Assessment/Plan    PT Assessment Patient needs continued PT services  PT Problem List Decreased strength;Decreased mobility;Decreased safety awareness;Decreased activity tolerance;Decreased skin integrity;Cardiopulmonary status limiting activity;Decreased balance;Pain       PT  Treatment Interventions Therapeutic activities;Therapeutic exercise;Patient/family education;Balance training;Functional mobility training    PT Goals (Current goals can be found in the Care Plan section)  Acute Rehab PT Goals Patient Stated Goal: to go to SNF and get better PT Goal Formulation: With patient Time For Goal Achievement: 04/24/19 Potential to Achieve Goals: Fair    Frequency Min 2X/week   Barriers to discharge Decreased caregiver support pt reprots present situation hard on family and they cant keep helping her at this +2 level    Co-evaluation               AM-PAC PT "6 Clicks" Mobility  Outcome Measure Help needed turning from your back to your side while in a flat bed without using bedrails?: A Lot Help needed moving from lying on your back to sitting on the side of a flat bed without using bedrails?: A Lot Help needed moving to and from a bed to a chair (including a wheelchair)?: Total Help needed standing up from a chair using your arms (e.g., wheelchair or bedside chair)?: Total Help needed to walk in hospital room?: Total Help needed climbing 3-5 steps with a railing? : Total 6 Click Score: 8    End of Session   Activity Tolerance: Patient limited by pain Patient left: in bed;with call bell/phone within reach Nurse Communication: Patient requests pain meds PT Visit Diagnosis: Muscle weakness (generalized) (M62.81);Adult, failure to thrive (R62.7);Pain    Time: 1400-1420 PT Time Calculation (min) (ACUTE ONLY): 20 min   Charges:   PT Evaluation $PT Eval Low Complexity: 1 Low          04/10/2019   Rande Lawman, PT   Loyal Buba 04/10/2019, 3:09 PM

## 2019-04-10 NOTE — Progress Notes (Signed)
OT Cancellation Note  Patient Details Name: Norma [name] MRN: [id number] DOB: 03/17/1946   Cancelled Treatment:    Reason Eval/Treat Not Completed: Fatigue/lethargy limiting ability to participate.  Pt reports she is exhausted.    Lucille Passy, OTR/L Acute Rehabilitation Services Pager [phone] Office [phone]   Lucille Passy M 04/10/2019, 2:15 PM

## 2019-04-10 NOTE — Progress Notes (Signed)
Subjective: Patient was seen at bedside. She states that she is fatigued and tired of being poked and prodded. She endorses nausea without vomiting and constipation. Some of her wounds were assessed as wound care was in the process of changing the dressings, and this was very painful for the patient.   Objective: Vital signs in last 24 hours: Vitals:   04/10/19 0500 04/10/19 0710 04/10/19 0730 04/10/19 0748  BP: (!) 81/55  (!) 89/59 110/78  Pulse: 88 89 (!) 44 93  Resp: 19 17 16 19   Temp:  (!) 97.5 F (36.4 C)    TempSrc:  Oral    SpO2: 96% 97% 98% 96%  Weight:      Height:       Weight change:  No intake or output data in the 24 hours ending 04/10/19 0716 General appearance: alert, cooperative and fatigued Head: Normocephalic, without obvious abnormality, atraumatic Lungs: clear to auscultation bilaterally Heart: regular rate and rhythm, S1, S2 normal, no murmur, click, rub or gallop Abdomen: soft, non-tender; bowel sounds normal Pulses: 2+ in upper extremities. 2+ in right dorsalis pedis; not appreciable in left dorsalis pedis or bilateral posterior tibialis Skin: Skin color, texture, turgor normal. Bilateral sacral decubitus ulcers. The right side is bleeding. Multiple ulcers on the right and left calves between 1x1cm and 3cmx5cm with erythematous borders and dark, thickened centers.  Lab Results: - Mildly hyponatremic to 134, hypochloremic to 95, hyperphosphatemic to 1.6, and hypocalcemic to 7.8. - CBC notable for WBC elevated to 15.3, Hbg and Hct low at 9.6 and 29.6 after 1U PRBCs.  Micro Results: - COVID-19 negative - Anterior right thigh wound cultures from [facility] on 9/22 did not grow anything after 2 days. Few WBC and rare gram positive cocci present on stain.  Studies/Results: Dg Pelvis 1-2 Views  Result Date: 04/09/2019 CLINICAL DATA:  Decubitus ulcer, assess for osteomyelitis EXAM: PELVIS - 1-2 VIEW COMPARISON:  Hip radiograph 03/06/2016 FINDINGS: Total right  hip arthroplasty. Advanced left hip osteoarthritis with joint narrowing. Generalized osteopenia. Gas, stool, pelvic calcifications, structure overlap, and osteopenia are all limiting factors in detecting osteomyelitis. IMPRESSION: 1. This modality is insufficient for detecting osteomyelitis. MRI or CT without contrast (less sensitive) would be needed to further assess. 2. Advanced left hip osteoarthritis. Electronically Signed   By: Monte Fantasia M.D.   On: 04/09/2019 11:07   Dg Chest Port 1 View  Result Date: 04/09/2019 CLINICAL DATA:  Decreased blood sugar. Hypoglycemia. Evaluate for infection. EXAM: PORTABLE CHEST 1 VIEW COMPARISON:  03/30/2019 FINDINGS: Right chest wall port a catheter is identified with tip in the projection of the cavoatrial junction. Previous median sternotomy and CABG procedure. Cardiac enlargement. Aortic atherosclerotic calcifications. Bilateral pleural effusions with pulmonary vascular congestion. IMPRESSION: 1. Cardiac enlargement and mild CHF. 2.  Aortic Atherosclerosis (ICD10-I70.0). Electronically Signed   By: Kerby Moors M.D.   On: 04/09/2019 10:31   Dg Hand Complete Left  Result Date: 04/09/2019 CLINICAL DATA:  Gangrenous left index finger EXAM: LEFT HAND - COMPLETE 3+ VIEW COMPARISON:  None. FINDINGS: No fracture or dislocation of the left hand. Joint spaces are preserved. There is extensive vascular calcinosis about the hand and wrist. There is no specific radiographic soft tissue abnormality of the index digit. IMPRESSION: No fracture or dislocation of the left hand. Joint spaces are preserved. There is extensive vascular calcinosis about the hand and wrist. There is no specific radiographic soft tissue abnormality of the index digit. Electronically Signed   By: Eddie Candle  M.D.   On: 04/09/2019 11:07   Medications: I have reviewed the patient's current medications. Scheduled Meds: . sodium chloride   Intravenous Once  . Chlorhexidine Gluconate Cloth  6 each  Topical Daily  . [START ON 04/11/2019] darbepoetin (ARANESP) injection - DIALYSIS  200 mcg Intravenous Q Sat-HD  . [START ON 04/11/2019] influenza vaccine adjuvanted  0.5 mL Intramuscular Tomorrow-1000  . phosphorus  250 mg Oral BID  . sodium hypochlorite   Topical BID  . sodium hypochlorite   Topical BID  . sodium hypochlorite   Topical BID  . sodium hypochlorite   Topical BID  . sodium hypochlorite   Topical BID  . sodium hypochlorite   Topical BID  . sodium hypochlorite   Topical BID   Continuous Infusions: . ceFEPime (MAXIPIME) IV    . vancomycin     PRN Meds:.acetaminophen, diphenhydrAMINE-zinc acetate, HYDROcodone-acetaminophen Assessment/Plan: Principal Problem:   Hypoglycemia Active Problems:   ESRD (end stage renal disease) (HCC)   Sacral decubitus ulcer   Venous stasis ulcer (HCC)  Hypoglycemia: Blood sugars stable overnight (136-166) without glucose infusion and in the 140s this morning on continuous infusion of dextrose 10%.  Likely secondary to decreased renal excretion of insulin in the setting of ESRD. In the context of many medical comorbidities, a higher A1C goal around 8% may be appropriate. - stopped D10 infusion - Monitor CBGs every 4 hours - consider SSI sensitive for hypoglycemia - Consider D50 pushes as needed for low CBG levels - Renal diet   ESRD: Patient has a prior history of ESRD recently started on HD most likely secondary to poorly controlled diabetes and her hypertension.  She gets hemodialysis on MWF. 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 Called nephrology to ask for their opinion and waiting for them to evaluate tomorrow. - Nephrology is following (Dr. Jonnie Finner). - HD will be Saturday instead of tonight due to limited space and more urgent cases in the hospital. - Continue PhosLo - Daily BMP  Atrial fibrillation: Patient remains in atrial fibrillation on exam today but is rate controlled. Echocardiogram showed decreased EF to  40-45% down from 60-65% in 2014.  - Hold Eliquis due to ongoing blood loss - Holding carvedilol 12.5 given low BP  Anemia: Received 1u PRBCs and Hbg increased from 6.9 to 9.6.  - trend H&H - transfuse if symptomatic and/or below 7.0  Sacral decubitus ulcers and leg ulcers Patient has bilateral sacral decubitus ulcers which are quite severe. Wound care is following and rebandaged the wounds today. Patient is followed outpatient for wound care by Dr. Dellia Nims at [facility]. She has recently had wound cultures but does not appear to have had imaging to work up [possible osteomyelitis. Outpatient wound care believes that the sacral decubitus ulcers may be due to panniculitis, which can sometimes lead to calciphylaxis. 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 Some of her leg ulcers may also be due to venous stasis. We are considering consulting surgery about possible debridement; however, it is possible the patient would not be a good surgical candidate due to her age and multiple comorbidities. Will also consider consulting palliative care.  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 This is a new problem likely secondary to decreased ciruculation during recent placement of AV fistula. Seen by wound care today. - consider consulting surgery for possible amputation.  Hypotension: Patient blood pressure was low overnight and remains low in the 80's/50's. Home BP medications held. Treating for possible infection given elevated WBCs and multiple wounds are possible source of infection.  - Hold  clonidine 0.5 mg twice daily - Hold carvedilol 12 5 mg twice daily - f/u blood cultures - f/u urine cultures - empiric vancomycin and cefepime   Diet: Renal Code: Full DVT PPX: None due to anemia  This is a Careers information officer Note.  The care of the patient was discussed with Dr. Lynnae January and the assessment and plan formulated with their assistance.  Please see their attached note for official documentation of the daily encounter.   LOS: 1 day   Lennie Hummer, Medical Student 04/10/2019, 7:16 AM

## 2019-04-10 NOTE — Consult Note (Signed)
[facility] Nurse wound consult note Patient receiving care in [facility] 4E10.  Assisted with positioning patient by primary RN and NT.  Reason for Consult: assessment of multiple wounds  The left lateral malleolus has a stage 2 PI that measures 0.3 cm x 1 cm and is 100% pink.  This area just needs a foam dressing, change every 3 days and prn.  The right pretibial area has three distinct wounds; etiology unclear.  The one closest to the knee is 100% yellow slough and measures 2 cm x 2.8 cm.  The one most distal measures 2.2 cm x 1.3 cm and is 100% pink.  The middle one measures 1.3 cm x 1.3 cm and is 100% yellow.  All of these were covered with a hydrofiber and dry gauze.  I will have the nurses apply a silver impregnated hydrofiber and cover with a foam dressing and change every 3 days and prn.  The following wounds should be treated with 1/4% Dakins solution:  The right LE posterior leg, unstageable PI, has a 100% dark brown wound that measures 6 cm x 6 cm. Apply Dakin's solution to a 4 x 4 gauze, place over the wound, cover with an ABD pad. Change twice daily.  The right upper thigh has a hole, etiology unclear, that measures 1 cm x 1.3 cm x 3 cm with surrounding hard tissue.  It is malodorous.  This wound needs Dakin's moistened 1/4 packing strip, cover with dry gauze, change twice daily.  The right inner upper thigh has a wound, etiology unclear, that is 100% black and measures 5 cm x 4.5 cm.  This wound will benefit from 1/4% Dakins applied to a gauze, placed over the wound, and changed twice daily.  The left upper thigh has a hole, etiology unclear, that measures 1.3 cm x 2.3 cm x 2.3 cm and is yellow and pink and malodorous.  Apply Dakin's solution to 1/4 inch packing strip, place into wound, cover with dry gauze, change twice daily.  The right trochanter has a massive stage 4 PI wound that is highly malodorous and has copious brown drainage; it measures 9.2 cm x 9 cm x 2.2 cm and is mostly pink.   Moisten roll gauze (Kerlex) with Dakin's solution, place into wound, cover with ABD pads, change twice daily  The left trochanter also has a massive Stage 4 PI wound that is highly malodorous with heavy exudate and is covered in brown slough and measures 8.3 cm x 13 cm x 3 cm.  It is primarily brown and yellow.  This also needs roll gauze with Dakins covered by ABD pads twice daily.  The sacrum has a large Stage 3 PI wound that measures 6 cm x 8 cm and is primarily yellow.  Dakin's on gauze, covered by a sacral foam dressing twice daily is the treatment for this.  The left ischium has a 100% brown unstageable PI that measures 5 cm x 8 cm.  Again, Dakin's moisten gauze, cover with foam dressing, change twice daily.  Finally, the entire left 4th finger is turning black and likely represents dry gangrene.  It is wrapped in a silver hydrofiber.  This therapy will be continued.  Additional care measures include:  Low air loss mattress and Prevalong boots bilaterally.  Monitor the wound area(s) for worsening of condition such as: Signs/symptoms of infection,  Increase in size,  Development of or worsening of odor, Development of pain, or increased pain at the affected locations.  Notify the  medical team if any of these develop.  Thank you for the consult.  Discussed plan of care with the patient and bedside nurse.  [facility] nurse will not follow at this time.  Please re-consult the [facility] team if needed.  Val Riles, RN, MSN, CWOCN, CNS-BC, pager [phone]

## 2019-04-10 NOTE — Consult Note (Signed)
[facility] Renal Consultation Note  Indication for Consultation:  Management of ESRD/hemodialysis; anemia, hypertension/volume and secondary hyperparathyroidism  HPI: Norma [name] is a 73 y.o. female with ESRD 2/2  HTN/, DMT2, ( HD started 02/2019) Ho Hepatitis C, HFpEF, gout, DJD, SHPT, AOCD.  Left Brachial Cephalic AV fistula [id number] with steal (ligated 03/30/19)  Now  admitted with AMS with Hypoglycemia, deconditioning , Sacral ulcers, and lower extremity  Ulcers . Last OP HD on schedule wed 04/08/19 .[place] is awake  And in no distress. Is followed  at  [place] [facility] by Dr Myrtice Lauth . She denies any Fevers, chills, Sob, N/V/D (has some constipation ), No dizziness(Nonambulatory). She  remembers waiting up this am and seeing EMS and Daughter talking  to her.   IN ER last evening  No noted  overt signs of sepsis But   prominent leukocytosis to 15k. COVD-19 was negative. Blood cultures were obtained. BNP >4500, lactate 2.2, CMP with K+ 3.2, sCR of 2.32 hgb 6.9  And transfused 1unit prbcs .         Past Medical History:  Diagnosis Date  . Anxiety   . Carotid artery occlusion   . CKD (chronic kidney disease) stage 3, GFR 30-59 ml/min 04/18/2014  . Claudication ([employer] [employer])   . Constipation    Due to patient taking iron supplements  . Coronary artery disease   . DDD (degenerative disc disease)   . Diabetes mellitus   . Diabetic coma ([place])   . DJD (degenerative joint disease)   . DJD (degenerative joint disease)   . Edema    Bilateral lower extermities  . Gout   . Hepatitis    Hep C, has been treated with "[place]" - cured per pt  . Hyperlipidemia   . Hypertension   . Iron deficiency anemia   . Leg pain   . Obesity   . PAD (peripheral artery disease) ([place])   . Shortness of breath    exertion    Past Surgical History:  Procedure Laterality Date  . AORTA - BILATERAL FEMORAL ARTERY BYPASS GRAFT  05/25/2011   Procedure: AORTA BIFEMORAL BYPASS GRAFT;   Surgeon: Mal Misty, MD;  Location: [place];  Service: Vascular;  Laterality: N/A;  . APPLICATION OF WOUND VAC Right 04/23/2016   Procedure: APPLICATION OF WOUND VAC CHANGED;  Surgeon: Meredith Pel, MD;  Location: [place];  Service: Orthopedics;  Laterality: Right;  . AV FISTULA PLACEMENT Left 03/03/2018   Procedure: BRACHIOCEPHALIC ARTERIOVENOUS FISTULA CREATION LEFT ARM;  Surgeon: Elam Dutch, MD;  Location: [place];  Service: Vascular;  Laterality: Left;  . BREAST EXCISIONAL BIOPSY Right   . BREAST SURGERY    . CARDIAC CATHETERIZATION  12/01/2009   EF 65%  . CARDIOVASCULAR STRESS TEST  11/28/2009   EF 75%  . COLONOSCOPY W/ POLYPECTOMY    . CORONARY ARTERY BYPASS GRAFT  12/08/2009   LIMA GRAFT TO THE DISTAL LAD AND SAPHENOUS VEIN GRAFT TO THE OBTUSE MARGINAL VESSEL  . EYE SURGERY    . FISTULA SUPERFICIALIZATION Left 07/28/2018   Procedure: LEFT ARM FISTULA SUPERFICIALIZATION;  Surgeon: Elam Dutch, MD;  Location: [place];  Service: Vascular;  Laterality: Left;  . I&D EXTREMITY Right 04/23/2016   Procedure: IRRIGATION AND DEBRIDEMENT EXTREMITY;  Surgeon: Meredith Pel, MD;  Location: [place];  Service: Orthopedics;  Laterality: Right;  . INCISION AND DRAINAGE HIP Right 04/20/2016   Procedure: IRRIGATION AND DEBRIDEMENT HIP WITH POLY EXCHANGE, ABX BEAD PLACEMENT,  WOUND VAC ;  Surgeon: Meredith Pel, MD;  Location: [place];  Service: Orthopedics;  Laterality: Right;  RIGHT HIP SUPERFICIAL I&D, POSSIBLE DEEP I&D, LINER EXCHANGE, ABX BEAD PLACEMENT, WOUND VAC.   . INSERTION OF DIALYSIS CATHETER Right 03/30/2019   Procedure: INSERTION OF Right Internal Jugular DIALYSIS CATHETER;  Surgeon: Elam Dutch, MD;  Location: [facility];  Service: Vascular;  Laterality: Right;  . LIGATION OF ARTERIOVENOUS  FISTULA Left 03/30/2019   Procedure: LIGATION OF ARTERIOVENOUS  FISTULA LEFT ARM;  Surgeon: Elam Dutch, MD;  Location: [place];  Service: Vascular;  Laterality: Left;  . PR VEIN  BYPASS GRAFT,AORTO-FEM-POP  05/25/11  . REMOVAL OF FIBROUS CYST FROM RIGHT BREAST  10+ YEARS  . RETINAL DETACHMENT SURGERY  10+ YEARS   LEFT EYE  . TOTAL HIP ARTHROPLASTY Right 03/06/2016  . TOTAL HIP ARTHROPLASTY Right 03/06/2016   Procedure: RIGHT TOTAL HIP ARTHROPLASTY ANTERIOR APPROACH;  Surgeon: Meredith Pel, MD;  Location: [place];  Service: Orthopedics;  Laterality: Right;  . TRANSTHORACIC ECHOCARDIOGRAM  12/01/2009   EF 60-65%      Family History  Problem Relation Age of Onset  . Hypertension Mother   . Coronary artery disease Father   . Hypertension Sister   . Cirrhosis Brother   . Kidney disease Daughter   . Breast cancer Neg Hx       reports that she quit smoking about 27 years ago. Her smoking use included cigarettes. She quit after 20.00 years of use. She has never used smokeless tobacco. She reports that she does not drink alcohol or use drugs.   Allergies  Allergen Reactions  . Carnosine Other (See Comments)  . Sulfasalazine     UNSPECIFIED REACTION   . Iohexol Itching, Rash and Other (See Comments)     Code: RASH, Desc: pt called 1 day post scanning stating that skin was red and "itching all over" some what better but still had symptom.. instructed pt to take benadryl to relieve symptoms,per dr Alvester Chou.if any problems call back/mms, Onset Date: [id number]   . Sulfa Antibiotics Other (See Comments)    UNSPECIFIED REACTION     Prior to Admission medications   Medication Sig Start Date End Date Taking? Authorizing Provider  acetaminophen (TYLENOL) 500 MG tablet Take 500 mg by mouth 2 (two) times daily.    Yes [provider]  apixaban (ELIQUIS) 5 MG TABS tablet Take 1 tablet (5 mg total) by mouth 2 (two) times daily. 04/02/19  Yes Lendon Colonel, NP  calcium acetate (PHOSLO) 667 MG capsule Take 1 capsule (667 mg total) by mouth 3 (three) times daily with meals. 03/08/19  Yes Deno Etienne, DO  carvedilol (COREG) 12.5 MG tablet Take 1 tablet (12.5 mg total)  by mouth 2 (two) times daily. Patient taking differently: Take 12.5 mg by mouth See admin instructions. Take 1 tablet (12.5 mg) by mouth once daily in the evenings ONLY on Mondays, Wednesdays, & Fridays. Take 1 tablet (12.5 mg) by mouth twice daily on Sundays, Tuesdays, Thursdays, & Saturdays. 09/23/13  Yes Martinique, Peter M, MD  furosemide (LASIX) 80 MG tablet Take 160 mg by mouth See admin instructions. Take 2 tablets (160 mg) by mouth twice daily on Sundays,  Tuesdays, Thursdays, & Saturdays.   Yes [provider]  HYDROcodone-acetaminophen (NORCO) 5-325 MG tablet Take 1 tablet by mouth every 6 (six) hours as needed for severe pain. 02/19/19  Yes Sherwood Gambler, MD  insulin glargine (LANTUS) 100 UNIT/ML injection  Inject 0.2 mLs (20 Units total) into the skin 2 (two) times daily. Patient taking differently: Inject 12 Units into the skin daily.  04/25/16  Yes Meredith Pel, MD  Multiple Vitamin (MULTIVITAMIN WITH MINERALS) TABS tablet Take 1 tablet by mouth every evening.   Yes [provider]  ondansetron (ZOFRAN) 4 MG tablet Take 4 mg by mouth every 8 (eight) hours as needed for nausea or vomiting.   Yes [provider]  traMADol (ULTRAM) 50 MG tablet Take 50-100 mg by mouth every 8 (eight) hours as needed for pain. 03/09/19  Yes [provider]     Anti-infectives (From admission, onward)   Start     Dose/Rate Route Frequency Ordered Stop   04/10/19 2000  ceFEPIme (MAXIPIME) 2 g in sodium chloride 0.9 % 100 mL IVPB     2 g 200 mL/hr over 30 Minutes Intravenous Every M-W-F (2000) 04/09/19 2207     04/10/19 1200  vancomycin (VANCOCIN) IVPB 750 mg/150 ml premix     750 mg 150 mL/hr over 60 Minutes Intravenous Every M-W-F (Hemodialysis) 04/09/19 2207     04/09/19 2215  vancomycin (VANCOCIN) 1,750 mg in sodium chloride 0.9 % 500 mL IVPB     1,750 mg 250 mL/hr over 120 Minutes Intravenous  Once 04/09/19 2202 04/10/19 0150   04/09/19 2215  ceFEPIme (MAXIPIME)  2 g in sodium chloride 0.9 % 100 mL IVPB     2 g 200 mL/hr over 30 Minutes Intravenous  Once 04/09/19 2202 04/09/19 2300      Results for orders placed or performed during the hospital encounter of 04/09/19 (from the past 48 hour(s))  CBG monitoring, ED     Status: None   Collection Time: 04/09/19  9:59 AM  Result Value Ref Range   Glucose-Capillary 95 70 - 99 mg/dL   Comment 1 Notify RN    Comment 2 Document in Chart   Lactic acid, plasma     Status: Abnormal   Collection Time: 04/09/19 10:00 AM  Result Value Ref Range   Lactic Acid, Venous 2.2 (HH) 0.5 - 1.9 mmol/L    Comment: CRITICAL RESULT CALLED TO, READ BACK BY AND VERIFIED WITH: K COBB RN [phone] [id number] BY A BENNETT Performed at [facility], [place] [address] [place], [place] [address]   Comprehensive metabolic panel     Status: Abnormal   Collection Time: 04/09/19 10:08 AM  Result Value Ref Range   Sodium 137 135 - 145 mmol/L   Potassium 3.2 (L) 3.5 - 5.1 mmol/L   Chloride 97 (L) 98 - 111 mmol/L   CO2 29 22 - 32 mmol/L   Glucose, Bld 148 (H) 70 - 99 mg/dL   BUN 12 8 - 23 mg/dL   Creatinine, Ser 2.32 (H) 0.44 - 1.00 mg/dL   Calcium 7.6 (L) 8.9 - 10.3 mg/dL   Total Protein 4.6 (L) 6.5 - 8.1 g/dL   Albumin 1.6 (L) 3.5 - 5.0 g/dL   AST 20 15 - 41 U/L   ALT 6 0 - 44 U/L   Alkaline Phosphatase 83 38 - 126 U/L   Total Bilirubin 0.8 0.3 - 1.2 mg/dL   GFR calc non Af Amer 20 (L) >60 mL/min   GFR calc Af Amer 23 (L) >60 mL/min   Anion gap 11 5 - 15    Comment: Performed at [facility], [place] [address], [place], [place] [address]  CBC with Differential     Status:  Abnormal   Collection Time: 04/09/19 10:08 AM  Result Value Ref Range   WBC 15.7 (H) 4.0 - 10.5 K/uL   RBC 2.31 (L) 3.87 - 5.11 MIL/uL   Hemoglobin 6.9 (LL) 12.0 - 15.0 g/dL    Comment: REPEATED TO VERIFY THIS CRITICAL RESULT HAS VERIFIED AND BEEN CALLED TO J.ALLISON,RN BY BONNIE DAVIS ON 10 01 2020 AT 1039, AND HAS BEEN READ BACK.     HCT 23.5 (L)  36.0 - 46.0 %   MCV 101.7 (H) 80.0 - 100.0 fL   MCH 29.9 26.0 - 34.0 pg   MCHC 29.4 (L) 30.0 - 36.0 g/dL   RDW 22.6 (H) 11.5 - 15.5 %   Platelets 216 150 - 400 K/uL   nRBC 0.2 0.0 - 0.2 %   Neutrophils Relative % 92 %   Neutro Abs 14.4 (H) 1.7 - 7.7 K/uL   Lymphocytes Relative 3 %   Lymphs Abs 0.5 (L) 0.7 - 4.0 K/uL   Monocytes Relative 4 %   Monocytes Absolute 0.7 0.1 - 1.0 K/uL   Eosinophils Relative 0 %   Eosinophils Absolute 0.0 0.0 - 0.5 K/uL   Basophils Relative 0 %   Basophils Absolute 0.0 0.0 - 0.1 K/uL   Immature Granulocytes 1 %   Abs Immature Granulocytes 0.15 (H) 0.00 - 0.07 K/uL    Comment: Performed at [facility] [address], [place], [place] [address]  Culture, blood (routine x 2)     Status: None (Preliminary result)   Collection Time: 04/09/19 10:08 AM   Specimen: BLOOD  Result Value Ref Range   Specimen Description BLOOD UNKNOWN    Special Requests      BOTTLES DRAWN AEROBIC AND ANAEROBIC Blood Culture adequate volume   Culture      NO GROWTH < 24 HOURS Performed at [facility], [place] [address] [place], [place] [address]    Report Status PENDING   Brain natriuretic peptide     Status: Abnormal   Collection Time: 04/09/19 10:08 AM  Result Value Ref Range   B Natriuretic Peptide >4,500.0 (H) 0.0 - 100.0 pg/mL    Comment: Performed at [facility] [address], [place], [place] [address]  SARS Coronavirus 2 [facility] order, Performed in [facility] hospital lab) Nasopharyngeal Nasopharyngeal Swab     Status: None   Collection Time: 04/09/19 11:39 AM   Specimen: Nasopharyngeal Swab  Result Value Ref Range   SARS Coronavirus 2 NEGATIVE NEGATIVE    Comment: (NOTE) If result is NEGATIVE SARS-CoV-2 target nucleic acids are NOT DETECTED. The SARS-CoV-2 RNA is generally detectable in upper and lower  respiratory specimens during the acute phase of infection. The lowest  concentration of SARS-CoV-2 viral copies this assay can detect is  250  copies / mL. A negative result does not preclude SARS-CoV-2 infection  and should not be used as the sole basis for treatment or other  patient management decisions.  A negative result may occur with  improper specimen collection / handling, submission of specimen other  than nasopharyngeal swab, presence of viral mutation(s) within the  areas targeted by this assay, and inadequate number of viral copies  (<250 copies / mL). A negative result must be combined with clinical  observations, patient history, and epidemiological information. If result is POSITIVE SARS-CoV-2 target nucleic acids are DETECTED. The SARS-CoV-2 RNA is generally detectable in upper and lower  respiratory specimens dur ing the acute phase of infection.  Positive  results are indicative of active infection with SARS-CoV-2.  Clinical  correlation with patient history and other diagnostic information is  necessary to determine patient infection status.  Positive results do  not rule out bacterial infection or co-infection with other viruses. If result is PRESUMPTIVE POSTIVE SARS-CoV-2 nucleic acids MAY BE PRESENT.   A presumptive positive result was obtained on the submitted specimen  and confirmed on repeat testing.  While 2019 novel coronavirus  (SARS-CoV-2) nucleic acids may be present in the submitted sample  additional confirmatory testing may be necessary for epidemiological  and / or clinical management purposes  to differentiate between  SARS-CoV-2 and other Sarbecovirus currently known to infect humans.  If clinically indicated additional testing with an alternate test  methodology [phone]) is advised. The SARS-CoV-2 RNA is generally  detectable in upper and lower respiratory sp ecimens during the acute  phase of infection. The expected result is Negative. Fact Sheet for Patients:  StrictlyIdeas.no Fact Sheet for Healthcare  Providers: BankingDealers.co.za This test is not yet approved or cleared by the [place] FDA and has been authorized for detection and/or diagnosis of SARS-CoV-2 by FDA under an Emergency Use Authorization (EUA).  This EUA will remain in effect (meaning this test can be used) for the duration of the COVID-19 declaration under Section 564(b)(1) of the Act, 21 U.S.C. section 360bbb-3(b)(1), unless the authorization is terminated or revoked sooner. Performed at [facility], [place] [address] [place], [place] [address]   Culture, blood (routine x 2)     Status: None (Preliminary result)   Collection Time: 04/09/19 11:46 AM   Specimen: BLOOD RIGHT HAND  Result Value Ref Range   Specimen Description BLOOD RIGHT HAND    Special Requests      BOTTLES DRAWN AEROBIC AND ANAEROBIC Blood Culture results may not be optimal due to an inadequate volume of blood received in culture bottles   Culture      NO GROWTH < 24 HOURS Performed at [facility] [address] [place], [place] [address]    Report Status PENDING   POC CBG, ED     Status: Abnormal   Collection Time: 04/09/19 11:52 AM  Result Value Ref Range   Glucose-Capillary 46 (L) 70 - 99 mg/dL   Comment 1 Notify RN    Comment 2 Document in Chart   POC CBG, ED     Status: Abnormal   Collection Time: 04/09/19  1:07 PM  Result Value Ref Range   Glucose-Capillary 136 (H) 70 - 99 mg/dL  POC CBG, ED     Status: Abnormal   Collection Time: 04/09/19  2:52 PM  Result Value Ref Range   Glucose-Capillary 142 (H) 70 - 99 mg/dL  Prepare RBC     Status: None   Collection Time: 04/09/19  3:00 PM  Result Value Ref Range   Order Confirmation      ORDER PROCESSED BY BLOOD BANK Performed at [facility], [place] [address] [place], [place] [address]   Type and screen [place]     Status: None   Collection Time: 04/09/19  3:20 PM  Result Value Ref Range   ABO/RH(D) A POS    Antibody  Screen NEG    Sample Expiration 04/12/2019,2359    Unit Number [id number]    Blood Component Type RED CELLS,LR    Unit division 00    Status of Unit ISSUED,FINAL    Transfusion Status OK TO TRANSFUSE  Crossmatch Result      Compatible Performed at [facility], [place] [address] [place], [place] [address]   POC CBG, ED     Status: Abnormal   Collection Time: 04/09/19  3:58 PM  Result Value Ref Range   Glucose-Capillary 159 (H) 70 - 99 mg/dL   Comment 1 Document in Chart   Glucose, capillary     Status: Abnormal   Collection Time: 04/09/19  5:30 PM  Result Value Ref Range   Glucose-Capillary 143 (H) 70 - 99 mg/dL   Comment 1 Document in Chart   CBG monitoring, ED     Status: Abnormal   Collection Time: 04/09/19  9:39 PM  Result Value Ref Range   Glucose-Capillary 169 (H) 70 - 99 mg/dL  Vitamin B12     Status: Abnormal   Collection Time: 04/09/19 10:06 PM  Result Value Ref Range   Vitamin B-12 1,751 (H) 180 - 914 pg/mL    Comment: (NOTE) This assay is not validated for testing neonatal or myeloproliferative syndrome specimens for Vitamin B12 levels. Performed at [facility], [place] [address] [place], [place] [address]   Folate     Status: None   Collection Time: 04/09/19 10:06 PM  Result Value Ref Range   Folate 8.9 >5.9 ng/mL    Comment: Performed at [facility] [address] [place], [place] [address]  Hemoglobin and hematocrit, blood     Status: Abnormal   Collection Time: 04/09/19 10:32 PM  Result Value Ref Range   Hemoglobin 9.2 (L) 12.0 - 15.0 g/dL    Comment: REPEATED TO VERIFY POST TRANSFUSION SPECIMEN    HCT 30.1 (L) 36.0 - 46.0 %    Comment: Performed at [facility] [address], [place], [place] [address]  Renal function panel     Status: Abnormal   Collection Time: 04/10/19  2:01 AM  Result Value Ref Range   Sodium 134 (L) 135 - 145 mmol/L   Potassium 3.8 3.5 - 5.1 mmol/L   Chloride 95 (L) 98 - 111 mmol/L   CO2 26 22  - 32 mmol/L   Glucose, Bld 166 (H) 70 - 99 mg/dL   BUN 19 8 - 23 mg/dL   Creatinine, Ser 2.80 (H) 0.44 - 1.00 mg/dL   Calcium 7.8 (L) 8.9 - 10.3 mg/dL   Phosphorus 1.6 (L) 2.5 - 4.6 mg/dL   Albumin 1.7 (L) 3.5 - 5.0 g/dL   GFR calc non Af Amer 16 (L) >60 mL/min   GFR calc Af Amer 19 (L) >60 mL/min   Anion gap 13 5 - 15    Comment: Performed at [facility], [address] [place], [place] [address]  CBC     Status: Abnormal   Collection Time: 04/10/19  2:01 AM  Result Value Ref Range   WBC 15.6 (H) 4.0 - 10.5 K/uL   RBC 3.03 (L) 3.87 - 5.11 MIL/uL   Hemoglobin 9.3 (L) 12.0 - 15.0 g/dL   HCT 29.6 (L) 36.0 - 46.0 %   MCV 97.7 80.0 - 100.0 fL   MCH 30.7 26.0 - 34.0 pg   MCHC 31.4 30.0 - 36.0 g/dL   RDW 21.3 (H) 11.5 - 15.5 %   Platelets 209 150 - 400 K/uL   nRBC 0.3 (H) 0.0 - 0.2 %    Comment: Performed at [facility], [place] [address], [place], [place] [address]  CBG monitoring, ED  Status: Abnormal   Collection Time: 04/10/19  5:28 AM  Result Value Ref Range   Glucose-Capillary 142 (H) 70 - 99 mg/dL  Glucose, capillary     Status: Abnormal   Collection Time: 04/10/19 12:25 PM  Result Value Ref Range   Glucose-Capillary 224 (H) 70 - 99 mg/dL     ROS: see hpi for positves   Physical Exam: Vitals:   04/10/19 0730 04/10/19 0748  BP: (!) 89/59 110/78  Pulse: (!) 44 93  Resp: 16 19  Temp:    SpO2: 98% 96%     General: Alert Elderly female  Appears chronically Ill, NAD  HEENT: [place] , Anicteric ,MMM, Poor dentati on  Neck: No JVD Heart: Rare irreg  VR 60's on monitor , no M,F, G Lungs: CTA , no ralees, wheezing,crackles, Nonlabred   Abdomen: BS pos. Soft , NT, ND , No bruits or appreciated mass/ NOTED Picture of Sacral Ulcer  Extremities: Bilat  Lower extrem. wounds lower legs /Right calf wound tender with moist necrotic area / Left Malleolus with DuoDerm  dressing not removed and right foot with duoderm dressings not removed  Skin:Left Index finger tip dry  ulcer / no overt rash/ wounds as above  Neuro: Bilateral lower exterm weakness , Decr hand grip Left /  Dialysis Access: R IJ Perm cath / LUA AVF No Bruit (sp Ligation )   Dialysis Orders: Center: Adm  farm  on mwf . EDW 67 kg  HD Bath 2k/ 2.25  Time 4 hr Heparin NONE. [employer] cath ( LUA AVF Li gated 03/30/19)     Mircera 225 mcg IV q 2wks /HD ( last on 03/27/19)   Other  Noted labs HGB 7.6 03/27/19 TFS 13% (03/25/19)  Assessment/Plan 1. ESRD -  HD MWF schedule  HD will be in AM sec . numerous more emergent cases in Hospital/ K and vol stable now  2. AMS Multifactorial=   with  Hypoglycemia/ weakness /  Sacral and lower extrem Wounds deconditioning = wu per admit team / iv antibiotics /wound care team seeing  3. Hypertension/volume  - bp  On low side / WIll dc LASIX( home meds)  in  ESRD pt (reports"" only making  drops of Urine" 4. Anemia  - HGB 6.9 to 9.3 this am after transfusion  PRBCS / contin  ESA in Hospital ( due Aranesp Masx dose next HD )  OP  Trans Fer. Sat  13% 9(/`13/20 ) hold iv iron in setting of infection 5. Metabolic bone disease -   Calcium Acetate as Phos binder /hold binder with Phos 1.6 (give K phos supplement  )  Ca corrected = 9.6 binder /  No vit d as op  6. Atrila Fib=was  On Eliquis(held by admit 2/2 bleeding ulcers   and Coreg  7. Nutrition - alb  1,7 eeds Protein supplementation   Ernest Haber, PA-C [place] [phone] 04/10/2019, 2:03 PM

## 2019-04-10 NOTE — ED Notes (Signed)
Pt repositioned in the bed and placed on new linen, pillows placed behind pt and legs propped her for comfort. Bp cuff repositioned on R forearm with improvement of pressures

## 2019-04-11 DIAGNOSIS — D649 Anemia, unspecified: Secondary | ICD-10-CM

## 2019-04-11 DIAGNOSIS — M545 Low back pain: Secondary | ICD-10-CM

## 2019-04-11 LAB — CBC
HCT: 30.1 % — ABNORMAL LOW (ref 36.0–46.0)
Hemoglobin: 9.2 g/dL — ABNORMAL LOW (ref 12.0–15.0)
MCH: 29.5 pg (ref 26.0–34.0)
MCHC: 30.6 g/dL (ref 30.0–36.0)
MCV: 96.5 fL (ref 80.0–100.0)
Platelets: 221 10*3/uL (ref 150–400)
RBC: 3.12 MIL/uL — ABNORMAL LOW (ref 3.87–5.11)
RDW: 20.6 % — ABNORMAL HIGH (ref 11.5–15.5)
WBC: 13.3 10*3/uL — ABNORMAL HIGH (ref 4.0–10.5)
nRBC: 0.4 % — ABNORMAL HIGH (ref 0.0–0.2)

## 2019-04-11 LAB — BASIC METABOLIC PANEL
Anion gap: 13 (ref 5–15)
BUN: 29 mg/dL — ABNORMAL HIGH (ref 8–23)
CO2: 25 mmol/L (ref 22–32)
Calcium: 8.2 mg/dL — ABNORMAL LOW (ref 8.9–10.3)
Chloride: 95 mmol/L — ABNORMAL LOW (ref 98–111)
Creatinine, Ser: 3.71 mg/dL — ABNORMAL HIGH (ref 0.44–1.00)
GFR calc Af Amer: 13 mL/min — ABNORMAL LOW (ref >60)
GFR calc non Af Amer: 11 mL/min — ABNORMAL LOW (ref >60)
Glucose, Bld: 132 mg/dL — ABNORMAL HIGH (ref 70–99)
Potassium: 4.3 mmol/L (ref 3.5–5.1)
Sodium: 133 mmol/L — ABNORMAL LOW (ref 135–145)

## 2019-04-11 LAB — GLUCOSE, CAPILLARY
Glucose-Capillary: 149 mg/dL — ABNORMAL HIGH (ref 70–99)
Glucose-Capillary: 96 mg/dL (ref 70–99)
Glucose-Capillary: 169 mg/dL — ABNORMAL HIGH (ref 70–99)
Glucose-Capillary: 243 mg/dL — ABNORMAL HIGH (ref 70–99)

## 2019-04-11 LAB — HEPATITIS B SURFACE ANTIBODY,QUALITATIVE: Hep B S Ab: NONREACTIVE

## 2019-04-11 LAB — HEPATITIS B SURFACE ANTIGEN: Hepatitis B Surface Ag: NONREACTIVE

## 2019-04-11 LAB — HEPATITIS B CORE ANTIBODY, TOTAL: Hep B Core Total Ab: NONREACTIVE

## 2019-04-11 MED ORDER — PRO-STAT SUGAR FREE PO LIQD
30.0000 mL | Freq: Two times a day (BID) | ORAL | Status: DC
  Administered 2019-04-11 – 2019-04-16 (×9): 30 mL via ORAL
  Filled 2019-04-11 (×9): qty 30

## 2019-04-11 MED ORDER — SODIUM THIOSULFATE 25 % IV SOLN
25.0000 g | Freq: Once | INTRAVENOUS | Status: AC
  Administered 2019-04-11: 13:00:00 25 g via INTRAVENOUS
  Filled 2019-04-11: qty 100

## 2019-04-11 MED ORDER — TRAMADOL HCL 50 MG PO TABS
50.0000 mg | ORAL_TABLET | Freq: Every day | ORAL | Status: DC | PRN
  Administered 2019-04-11 – 2019-04-16 (×3): 50 mg via ORAL
  Filled 2019-04-11 (×3): qty 1

## 2019-04-11 MED ORDER — NEPRO/CARBSTEADY PO LIQD
237.0000 mL | Freq: Two times a day (BID) | ORAL | Status: DC
  Administered 2019-04-11 – 2019-04-15 (×6): 237 mL via ORAL
  Filled 2019-04-11 (×2): qty 237

## 2019-04-11 MED ORDER — DARBEPOETIN ALFA 200 MCG/0.4ML IJ SOSY
PREFILLED_SYRINGE | INTRAMUSCULAR | Status: AC
  Filled 2019-04-11: qty 0.4

## 2019-04-11 MED ORDER — SODIUM THIOSULFATE 25 % IV SOLN
25.0000 g | INTRAVENOUS | Status: DC
  Filled 2019-04-11 (×2): qty 100

## 2019-04-11 MED ORDER — RENA-VITE PO TABS
1.0000 | ORAL_TABLET | Freq: Every day | ORAL | Status: DC
  Administered 2019-04-11 – 2019-04-14 (×4): 1 via ORAL
  Filled 2019-04-11 (×4): qty 1

## 2019-04-11 NOTE — Progress Notes (Signed)
OT Cancellation Note  Patient Details Name: Norma [name] MRN: [id number] DOB: 1946-03-30   Cancelled Treatment:    Reason Eval/Treat Not Completed: Patient at procedure or test/ unavailable. Pt in hemo.  [place], OTR/L Acute Rehab Services Pager [phone] Office [phone]     Almon Register 04/11/2019, 8:42 AM

## 2019-04-11 NOTE — Progress Notes (Signed)
Attempted to call family to discuss consulting palliative care for a goals of care discussion. [id number] is a wrong number for Norma [name] Son Norma [name] and daughter Norma [name] did not pick up x4 at other numbers listed.

## 2019-04-11 NOTE — Progress Notes (Signed)
Norma [name] is confused tonight and has been yelling to call her sister [name]. Early on, at the beginning of the shift patient called and asked to removed her [place] boots because "they just announced her that her mother had passed away" Patient was assessed / redirected to her environment. Patient stated that she is not confused and confirmed her statement, we 'll continue monitor.

## 2019-04-11 NOTE — Progress Notes (Addendum)
Subjective: [name] was seen at bedside while recieving hemodialysis. She currently endorses some lower back pain which she attributes to lying in her bed for dialysis. She denies dizziness, nausea, and shortness of breath and states that she has no other complaints at this time. She has been able to eat and has had a good appetite. She had no acute events overnight and stated that she was able to get some rest.   Objective: Vital signs in last 24 hours: BP (!) 124/35   Pulse 85   Temp 97.8 F (36.6 C) (Oral)   Resp 18   Ht 5\' 2"  (1.575 m)   Wt 72.1 kg   SpO2 98%   BMI 29.08 kg/m   Weight change: -9.485 kg  Intake/Output Summary (Last 24 hours) at 04/11/2019 [id number] Last data filed at 04/11/2019 0300 Gross per 24 hour  Intake 100 ml  Output -  Net 100 ml   General appearance: alert, cooperative and appears stated age Head: Normocephalic, without obvious abnormality, atraumatic Lungs: clear to auscultation bilaterally Heart: itregular rhythm, normal rate; S1/S2 normal, no murmur, click, rub or gallop Abdomen: soft, non-tender; bowel sounds normal Extremities: extremities normal, atraumatic, no cyanosis or edema; pulses 2+ and symmetric in upper and lower extremities Skin: Left fourth finder is black from the tip to the mid-finger, consistent with dry gangrene. Numerous wounds on both legs and back/sacral area which have been bandaged and documented by wound care. Neurologic: Alert and oriented X 3.  Lab Results: Albumin 1.7 Calcium 8.2 Phosphorus 1.6 WBC 13.3 Hemoglobin stable at 9.2  Micro Results: Blood cultures x2 from 04/09/19 NGTD  Studies/Results: Pelvic x-ray: total hip arthroplasty, advanced left hip osteoarthritis, generalized osteopenia. Not sufficient to detect osteomyelitis CXR: R chest wall port catheter in place, cardiac enlargement; aortic atherosclerotic calcifications, bilateral pleural effusions with pulmonary vascular congestion Hand x-ray: no fracture  or dislocation of left hand, extensive vascular calcinosis.   Medications: I have reviewed the patient's current medications. Scheduled Meds: . sodium chloride   Intravenous Once  . Chlorhexidine Gluconate Cloth  6 each Topical Daily  . darbepoetin (ARANESP) injection - DIALYSIS  200 mcg Intravenous Q Sat-HD  . influenza vaccine adjuvanted  0.5 mL Intramuscular Tomorrow-1000  . phosphorus  250 mg Oral BID  . sodium hypochlorite   Topical BID  . sodium hypochlorite   Topical BID  . sodium hypochlorite   Topical BID  . sodium hypochlorite   Topical BID  . sodium hypochlorite   Topical BID  . sodium hypochlorite   Topical BID  . sodium hypochlorite   Topical BID   Continuous Infusions: . ceFEPime (MAXIPIME) IV 2 g (04/10/19 2354)  . sodium thiosulfate infusion for calciphylaxis    . [START ON 04/13/2019] sodium thiosulfate infusion for calciphylaxis    . vancomycin     PRN Meds:.acetaminophen, diphenhydrAMINE-zinc acetate, HYDROcodone-acetaminophen, ondansetron (ZOFRAN) IV, traMADol Assessment/Plan: Principal Problem:   Hypoglycemia Active Problems:   Anemia of chronic disease   ESRD (end stage renal disease) (HCC)   Sacral decubitus ulcer   Venous stasis ulcer (HCC)  Hypoglycemia: Blood sugars stable overnight (143-169) without glucose infusion 132 this morning. Patient not currently receiving glucose or insulin.  -Monitor CBGs every 4 hours -consider SSI sensitive for hypoglycemia if glucose readings become elevated -Renal/carb modified diet with counseling at discharge  ESRD c/b calciphylaxis Patienthasa prior history of ESRD recently started on HD most likely secondary to poorly controlled diabetes and her hypertension. She gets hemodialysis on MWF. 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  - Nephrology is following (Dr. Jonnie Finner) and started sodium thiosulate for calciphylaxis. - HD today due to limited space and more  urgent cases in the hospital yesterday (usually MWF) - hold PhosLo given phosphate 1.6. - renal function panel  Atrial fibrillation: Patient remains in atrial fibrillation on exam todaybut is rate controlled and does not endorse symptoms of dizziness or shortness of breath. Echocardiogram showed decreased EF to 40-45% down from 60-65% in 2014.  -Hold Eliquis due to ongoing blood loss from ulcers -Holding carvedilol 12.5 given low BP  Anemia: Hbg stable from yesterday at 9.2. Anemia likely due to chronic kidney disease. - trend H&H - continue Aranesp (darbepoetin alba) during dialysis. - transfuse if symptomatic and/or below 7.0  Sacraldecubitus ulcers and leg ulcers Patient has bilateral sacral decubitus ulcers which are quite severe. Wound care is following and rebandaged the wounds today. Patient is followed outpatient for wound care by Dr[facility]ia [facility]. She has recently had wound cultures but does not appear to have had imaging to work up possible osteomyelitis. Outpatient wound care believes that the sacral decubitus ulcers may be due to panniculitis. Biopsy from 9/20 showed significant fat necrosis on in right hip wound without definitive features of calciphylaxis and focal fat necrosis in the right and left thighs.  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 Nephrology was unsure whether other leg ulcers might also be due to the same cause. Some of her leg ulcers appear to be due to venous stasis given dark appearance and setting of diminished pulses/poor circulation; this was supported by 9/4 biopsy. Given the setting of ESRD c/b caciphylaxis as well as other medical comorbidities, her prognosis is poor. We will initiate a goals of care conversation before consulting surgery about possible wound debridement. 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 This is a new problem likely secondary to decreased ciruculation during recent placement of AV fistula. Seen by wound care today. - consider consulting surgery for possible amputation.  Hypotension: Patient blood pressure improved from yesterday and variable from 102/40 to 141/114. WBCs trending down to 13.3 from 15.6 yesterday; patient continues to be afebrile and cultures from 10/1 with NGTD.  Will continue to hold BP medications and treat for possible infection given setting of multiple wounds as possible source of infection and improvement in WBCs and BP. - Hold clonidine 0.5 mg twice daily - Hold carvedilol 12 5 mg twice daily - f/u blood cultures at 48 h - continue vancomycin and cefepime   This is a Careers information officer Note.  The care of the patient was discussed with Dr. Charleen Kirks and the assessment and plan formulated with their assistance.  Please see their attached note for official documentation of the daily encounter.   LOS: 2 days   Lennie Hummer, Medical Student 04/11/2019, 9:27 AM

## 2019-04-11 NOTE — Evaluation (Signed)
Occupational Therapy Evaluation Patient Details Name: Norma [name] MRN: [id number] DOB: [birth year] Today's Date: 04/11/2019    History of Present Illness Norma [name] is a 73 yo F w/ a PMHx notable for ESRD on (MWF) secondary to longstanding poorly controlled hypertension diabetes, diabetes mellitus type 2, HTN, CAD, gout, hepatitis C status post treatment with [place], decubitus sacral arthritis, and peripheral artery disease who presents to the ED due to persistent hypoglycemia and altered mental status   Clinical Impression   This 73 yo female admitted with above presents to acute OT with increased pain (bottom) with movement, decreased mobility and generalized weakness all affecting how much she can A with her basic ADLs. She will benefit from acute OT with follow up at SNF to work on increasing her mobility and thus increase her ability to A with basic ADLs.     Follow Up Recommendations  SNF;Supervision/Assistance - 24 hour    Equipment Recommendations  Other (comment)(TBD at next venue)       Precautions / Restrictions Precautions Precautions: Fall Precaution Comments: pt has multiple sores (legs, hips, buttocks) - heels floating, dry gangrene on left hand Restrictions Weight Bearing Restrictions: No      Mobility Bed Mobility               General bed mobility comments: Pt not wanting to roll today due to painful         ADL either performed or assessed with clinical judgement   ADL Overall ADL's : Needs assistance/impaired Eating/Feeding: Set up;Bed level Eating/Feeding Details (indicate cue type and reason): No issues wtih arms to be able to self feed if positioned adequately in bed Grooming: Set up;Bed level Grooming Details (indicate cue type and reason): No issues wtih arms to be able to groom if positioned adequately in bed Upper Body Bathing: Moderate assistance;Bed level   Lower Body Bathing: Total assistance;Bed level   Upper Body Dressing :  Total assistance;Bed level   Lower Body Dressing: Total assistance;Bed level                       Vision Patient Visual Report: No change from baseline              Pertinent Vitals/Pain Pain Assessment: Faces Faces Pain Scale: Hurts even more Pain Location: buttocks when shifted in bed with bed pad (pt on air mattress that is set to alternate pressure every 2 minutes) Pain Descriptors / Indicators: Sore Pain Intervention(s): Limited activity within patient's tolerance;Monitored during session     Hand Dominance Right   Extremity/Trunk Assessment Upper Extremity Assessment Upper Extremity Assessment: Generalized weakness(pt able to move her arms WFL and also able to take minimal resistance)           Communication Communication Communication: No difficulties   Cognition Arousal/Alertness: Awake/alert Behavior During Therapy: WFL for tasks assessed/performed Overall Cognitive Status: Within Functional Limits for tasks assessed                                        Exercises Other Exercises Other Exercises: Pt agreeable to exercises x 4 extremities (AROM arms shoulder flexion with resisitive extension, elbow flex/ext with resistance, AAROM (support weight while pt did movement)--hip adduction/abduction, knee flex/ext, and ankle pumps.        Home Living Family/patient expects to be discharged to:: Skilled nursing facility  Additional Comments: pt has been living alone with family helping      Prior Functioning/Environment Level of Independence: Needs assistance  Gait / Transfers Assistance Needed: Pt reports she was transfer level only - it was taking 2 people to lift her up and get her into recliner and W/C     Comments: pt is a Facilities manager via zoom type service.  Pt going to dialysis and wound center weekly via [facility]; family taking her to MD appointments; sitting/sleeping in  recliner        OT Problem List: Decreased strength;Impaired balance (sitting and/or standing);Pain      OT Treatment/Interventions: Self-care/ADL training;DME and/or AE instruction;Patient/family education;Balance training;Therapeutic activities    OT Goals(Current goals can be found in the care plan section) Acute Rehab OT Goals Patient Stated Goal: to go to SNF and get better OT Goal Formulation: With patient Time For Goal Achievement: 04/25/19 Potential to Achieve Goals: Good  OT Frequency: Min 2X/week              AM-PAC OT "6 Clicks" Daily Activity     Outcome Measure Help from another person eating meals?: A Little Help from another person taking care of personal grooming?: A Little Help from another person toileting, which includes using toliet, bedpan, or urinal?: Total Help from another person bathing (including washing, rinsing, drying)?: Total Help from another person to put on and taking off regular upper body clothing?: Total Help from another person to put on and taking off regular lower body clothing?: Total 6 Click Score: 10   End of Session Nurse Communication: (open sore under chin)  Activity Tolerance: Patient tolerated treatment well Patient left: in bed  OT Visit Diagnosis: Other abnormalities of gait and mobility (R26.89);Muscle weakness (generalized) (M62.81);Pain Pain - part of body: (bottom)                Time: [id number] OT Time Calculation (min): 37 min Charges:  OT General Charges $OT Visit: 1 Visit OT Evaluation $OT Eval Moderate Complexity: 1 Mod OT Treatments $Therapeutic Exercise: 8-22 mins  [address] Circle, OTR/L Acute [employer] Pager [phone] Office [phone]     Almon Register 04/11/2019, 4:48 PM

## 2019-04-11 NOTE — Progress Notes (Signed)
Subjective:  Seen in HD, asleep awoken ,  Pain meds helping leg wounds / further exam of wound on Right inner thigh cw Calciphylaxis as dw Dr Jonnie Finner  Starting Na thio sulfate in HD /  Objective Vital signs in last 24 hours: Vitals:   04/11/19 1030 04/11/19 1100 04/11/19 1130 04/11/19 1211  BP: (!) 80/64 (!) 111/55 (!) 144/69 90/77  Pulse: 64 78 76 86  Resp:   17 19  Temp:   97.8 F (36.6 C) 97.7 F (36.5 C)  TempSrc:   Oral Oral  SpO2:   99% 94%  Weight:   72 kg   Height:       Weight change: -9.485 kg  Physical Exam: General: ON HD\, sleeping then awoken = Alert /  NAD  Neck: No JVD Heart: Rare irreg /stable rate , no M,R, G Lungs: CTA , no ralees, wheezing,crackles, Nonlabred   Abdomen: BS pos. Soft , NT, ND , No bruits or appreciated mass Extremities: Bilat  Lower extrem. Wounds Left/   Thigh noted Blackened area/  lower legs /Right calf wound tender with moist necrotic area / Left Malleolus with DuoDerm  dressing not removed and right foot with duoderm dressings not removed  L Index finger tip dry ulcer  Dialysis Access: R IJ Perm cath patent on HD  / LUA AVF No Bruit (sp Ligation )  Dialysis Orders: Center: [facility]  on mwf . EDW 67 kg  HD Bath 2k/ 2.25  Time 4 hr Heparin NONE. [employer] cath ( LUA AVF Li gated 03/30/19)     Mircera 225 mcg IV q 2wks /HD ( last on 03/27/19)   Other  Noted labs HGB 7.6 03/27/19 TFS 13% (03/25/19)  Problem/Plan: 1. ESRD -  HD MWF schedule  Off schedule today Saturday sec .yesterday  numerous more emergent cases in Hospital/ K = 4.3 and vol stable   2. AMS Multifactorial=   with  Hypoglycemia/ weakness /  Sacral and lower extrem Wounds deconditioning = appears resolved now /  3. Multiple Lower extremity wounds= Thigh wounds appear cw Calciphylaxis , NOTED 9/04 Derm Path report  =There are focal microcalcifications noted within the subcutaneous tissue; however, definitive features of calciphylaxis are notidentified  dw Dr Jonnie Finner will start Na  thiosulfate  Use  Low ca bath and have to monitor K with use of 2k bath with lowest ca bath available  wu per admit team / iv antibiotics /wound care team seeing / Pain appears stable now but may need Palliative  Care consult  If pain worsens  4. Hypertension/volume  - bp  On low side / WIll dc LASIX( home meds)  in  ESRD pt (reports"" only making  drops of Urine" 5. Anemia  - HGB 6.9 to 9.3>9.2  this am after transfusion  PRBCS / contin  ESA in Hospital ( due Aranesp Masx dose next HD )  OP  Trans Fer. Sat  13% 9(/`13/20 ) hold iv iron in setting of infection 6. Metabolic bone disease -   Calcium Acetate as Phos binder /hold binders dc all calcium based meds with Calciphylaxis type wounds  / No binder needed with Phos 1.6 (give K phos supplement  )  Ca corrected = 9.6 binder /  No vit d as op  7. Atrila Fib=was  On Eliquis(held by admit 2/2 bleeding ulcers   and Coreg  8. Nutrition - alb  1,7 eeds Protein supplementation   Ernest Haber, PA-C [facility]  Beeper [id number] 04/11/2019,1:16 PM  LOS: 2 days   Labs: Basic Metabolic Panel: Recent Labs  Lab 04/09/19 1008 04/10/19 0201 04/11/19 0625  NA 137 134* 133*  K 3.2* 3.8 4.3  CL 97* 95* 95*  CO2 29 26 25   GLUCOSE 148* 166* 132*  BUN 12 19 29*  CREATININE 2.32* 2.80* 3.71*  CALCIUM 7.6* 7.8* 8.2*  PHOS  --  1.6*  --    Liver Function Tests: Recent Labs  Lab 04/09/19 1008 04/10/19 0201  AST 20  --   ALT 6  --   ALKPHOS 83  --   BILITOT 0.8  --   PROT 4.6*  --   ALBUMIN 1.6* 1.7*   No results for input(s): LIPASE, AMYLASE in the last 168 hours. No results for input(s): AMMONIA in the last 168 hours. CBC: Recent Labs  Lab 04/09/19 1008 04/09/19 2232 04/10/19 0201 04/11/19 0625  WBC 15.7*  --  15.6* 13.3*  NEUTROABS 14.4*  --   --   --   HGB 6.9* 9.2* 9.3* 9.2*  HCT 23.5* 30.1* 29.6* 30.1*  MCV 101.7*  --  97.7 96.5  PLT 216  --  209 221   Cardiac Enzymes: No results for input(s): CKTOTAL, CKMB,  CKMBINDEX, TROPONINI in the last 168 hours. CBG: Recent Labs  Lab 04/10/19 1225 04/10/19 1656 04/10/19 2027 04/11/19 0445 04/11/19 1209  GLUCAP 224* 163* 155* 149* 96    Studies/Results: No results found. Medications: . ceFEPime (MAXIPIME) IV 2 g (04/10/19 2354)  . sodium thiosulfate infusion for calciphylaxis    . [START ON 04/13/2019] sodium thiosulfate infusion for calciphylaxis    . vancomycin 750 mg (04/11/19 1041)   . sodium chloride   Intravenous Once  . Chlorhexidine Gluconate Cloth  6 each Topical Daily  . darbepoetin (ARANESP) injection - DIALYSIS  200 mcg Intravenous Q Sat-HD  . influenza vaccine adjuvanted  0.5 mL Intramuscular Tomorrow-1000  . phosphorus  250 mg Oral BID  . sodium hypochlorite   Topical BID  . sodium hypochlorite   Topical BID  . sodium hypochlorite   Topical BID  . sodium hypochlorite   Topical BID  . sodium hypochlorite   Topical BID  . sodium hypochlorite   Topical BID  . sodium hypochlorite   Topical BID

## 2019-04-12 DIAGNOSIS — I4819 Other persistent atrial fibrillation: Secondary | ICD-10-CM

## 2019-04-12 DIAGNOSIS — L97913 Non-pressure chronic ulcer of unspecified part of right lower leg with necrosis of muscle: Secondary | ICD-10-CM

## 2019-04-12 DIAGNOSIS — L97923 Non-pressure chronic ulcer of unspecified part of left lower leg with necrosis of muscle: Secondary | ICD-10-CM

## 2019-04-12 DIAGNOSIS — D72829 Elevated white blood cell count, unspecified: Secondary | ICD-10-CM

## 2019-04-12 DIAGNOSIS — R410 Disorientation, unspecified: Secondary | ICD-10-CM

## 2019-04-12 LAB — RENAL FUNCTION PANEL
Albumin: 1.5 g/dL — ABNORMAL LOW (ref 3.5–5.0)
Anion gap: 22 — ABNORMAL HIGH (ref 5–15)
BUN: 19 mg/dL (ref 8–23)
CO2: 21 mmol/L — ABNORMAL LOW (ref 22–32)
Calcium: 8.2 mg/dL — ABNORMAL LOW (ref 8.9–10.3)
Chloride: 96 mmol/L — ABNORMAL LOW (ref 98–111)
Creatinine, Ser: 2.55 mg/dL — ABNORMAL HIGH (ref 0.44–1.00)
GFR calc Af Amer: 21 mL/min — ABNORMAL LOW (ref >60)
GFR calc non Af Amer: 18 mL/min — ABNORMAL LOW (ref >60)
Glucose, Bld: 165 mg/dL — ABNORMAL HIGH (ref 70–99)
Phosphorus: 2.1 mg/dL — ABNORMAL LOW (ref 2.5–4.6)
Potassium: 3.1 mmol/L — ABNORMAL LOW (ref 3.5–5.1)
Sodium: 139 mmol/L (ref 135–145)

## 2019-04-12 LAB — CBC
HCT: 27.9 % — ABNORMAL LOW (ref 36.0–46.0)
Hemoglobin: 8.9 g/dL — ABNORMAL LOW (ref 12.0–15.0)
MCH: 30.5 pg (ref 26.0–34.0)
MCHC: 31.9 g/dL (ref 30.0–36.0)
MCV: 95.5 fL (ref 80.0–100.0)
Platelets: 216 10*3/uL (ref 150–400)
RBC: 2.92 MIL/uL — ABNORMAL LOW (ref 3.87–5.11)
RDW: 20.4 % — ABNORMAL HIGH (ref 11.5–15.5)
WBC: 15.2 10*3/uL — ABNORMAL HIGH (ref 4.0–10.5)
nRBC: 0.3 % — ABNORMAL HIGH (ref 0.0–0.2)

## 2019-04-12 LAB — GLUCOSE, CAPILLARY
Glucose-Capillary: 155 mg/dL — ABNORMAL HIGH (ref 70–99)
Glucose-Capillary: 158 mg/dL — ABNORMAL HIGH (ref 70–99)
Glucose-Capillary: 178 mg/dL — ABNORMAL HIGH (ref 70–99)
Glucose-Capillary: 181 mg/dL — ABNORMAL HIGH (ref 70–99)
Glucose-Capillary: 219 mg/dL — ABNORMAL HIGH (ref 70–99)
Glucose-Capillary: 229 mg/dL — ABNORMAL HIGH (ref 70–99)

## 2019-04-12 MED ORDER — INSULIN ASPART 100 UNIT/ML ~~LOC~~ SOLN
1.0000 [IU] | Freq: Three times a day (TID) | SUBCUTANEOUS | Status: DC
  Administered 2019-04-14 (×3): 1 [IU] via SUBCUTANEOUS

## 2019-04-12 MED ORDER — SODIUM CHLORIDE 0.9 % IV SOLN
2.0000 g | Freq: Once | INTRAVENOUS | Status: AC
  Administered 2019-04-12: 12:00:00 2 g via INTRAVENOUS
  Filled 2019-04-12: qty 2

## 2019-04-12 MED ORDER — POTASSIUM CHLORIDE CRYS ER 10 MEQ PO TBCR
10.0000 meq | EXTENDED_RELEASE_TABLET | Freq: Two times a day (BID) | ORAL | Status: DC
  Administered 2019-04-12 – 2019-04-13 (×4): 10 meq via ORAL
  Filled 2019-04-12 (×4): qty 1

## 2019-04-12 NOTE — Progress Notes (Signed)
Subjective:   Norma [name] reports she is doing much better today. She is not endorsing any pain at the sites of the ulcers, stating they only hurt when wound care changes the bandages. She feels that HD helped her feel better yesterday.   Objective:  Vital signs in last 24 hours: Vitals:   04/12/19 0008 04/12/19 0354 04/12/19 0755 04/12/19 1153  BP: (!) 91/42  (!) 143/95 (!) 112/46  Pulse: 85 94 62 93  Resp: 17  16 15   Temp: 98.1 F (36.7 C) (!) 97 F (36.1 C) 97.8 F (36.6 C) 97.7 F (36.5 C)  TempSrc: Oral Oral Oral Oral  SpO2: 94%  94% 96%  Weight:      Height:        Physical Exam Vitals signs and nursing note reviewed.  Constitutional:      General: She is not in acute distress.    Appearance: She is normal weight. She is not toxic-appearing.  Cardiovascular:     Rate and Rhythm: Normal rate. Rhythm irregular.     Heart sounds: No murmur.  Pulmonary:     Effort: Pulmonary effort is normal. No respiratory distress.     Breath sounds: No wheezing or rales.  Abdominal:     General: Bowel sounds are normal.     Palpations: Abdomen is soft.  Musculoskeletal:     Right lower leg: No edema.     Left lower leg: No edema.  Skin:    General: Skin is warm and dry.     Comments: Multiple covered areas, including upper sternum and left 3rd digit  Neurological:     Mental Status: She is alert. Mental status is at baseline.    Assessment/Plan:  Principal Problem:   Hypoglycemia Active Problems:   Anemia of chronic disease   ESRD (end stage renal disease) (HCC)   Sacral decubitus ulcer   Venous stasis ulcer ([place])  # Hypoglycemia: Blood sugars stable overnight in the high 100s to low 200s. She is on a carb-modified diet and not receiving any insulin at this time. Given that she is a ESRD patient, there is no need to strictly regulate her BG. Will start with very sensitive SSI at 250 with 1 unit.   - Very sensitive, customized SSI only before meals, no post meal or  bedtime correction - Monitor CBGs every 4 hours -Renal/carb modified diet   # ESRD (MWF) # Calciphylaxis Recently started on HD approximately 5 weeks ago. ESRD most likely secondary to poorly controlled diabetes and hypertension.  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   With Ms. Wichert frail state, multiple severe decubitus ulcers, and now development of calciphylaxis, she may not be the best candidate for HD. Spoke with Dr. Jonnie Finner (Nephrology) who is in agreement that goals of care, particularly if hospice or palliative consults, need to be discussed with patient and her daughter (primary caretaker). Attempted to call this afternoon to see when daughter will be here again but reached answering machine; no VM left. Will try again. At this time, she is still scheduled for HD tomorrow, per her usual schedule.   -Nephrology is following and we appreciate their recommendations - Sodium thiosulate for calciphylaxis - HD tomorrow - Electrolytes per Nephro - Renal function panel - Goals of Care discussion   # Persistent Atrial fibrillation: Continue to persist but is rate controlled at this time. Echocardiogramshowed decreased EF to 40-45% down from 60-65%  in 2014  -Hold Eliquis due to ongoing blood loss from ulcers -Holdingcarvedilol 12.5 mggiven low BP  # Anemia: Anemia likely due to chronic kidney disease. Hemoglobin remains stable. It is possible that initial low hemoglobin requiring pRBCs may have been falsely low.   - Daily CBC - Continue Aranesp (darbepoetin alba) during dialysis. - Transfuse if symptomatic and/or below 7.0  # Sacraldecubitus ulcers (bilateral) # Lower extremity ulcers (bilateral)  # Leukocytosis  Patient has bilateral sacral decubitus ulcers that are both quite advanced. Wound care is following and rebandaged the wounds. Patient is followed outpatient  for wound care by Dr. Dellia [facility] at [facility]. She has recently had wound cultures but does not appear to have had imaging to work up possible osteomyelitis. Outpatient wound care believes that the sacral decubitus ulcers may be due to panniculitis. Biopsy from 9/20 showed significant fat necrosis on in right hip wound without definitive features of calciphylaxis and focal fat necrosis in the right and left thighs.    Some of her leg ulcers appear to be due to venous stasis given dark appearance and setting of diminished pulses/poor circulation; this was supported by 9/4 biopsy.   WBC continues to stay elevated although patient is a febrile. Blood cultures remain negative. Hypotensive events most likely due to HD but given her compromised state, plan to continue antibiotics at this time.   - Vancomycin per pharmacy  - Cefepime per pharmacy   # Hypotension: Secondary to HD vs infection from skin source. Holding home BP meds.   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 Wound care has dressed the wound. She is not a stable surgical candidate at this time and does see vascular surgery outpatient, so no further intervention at this time.     Dispo: Anticipated discharge pending medical improvement.   Dr. Jose Persia Internal Medicine PGY-1  Pager: [phone] 04/12/2019, 1:46 PM

## 2019-04-12 NOTE — Progress Notes (Signed)
This note also relates to the following rows which could not be included: Pulse Rate - Cannot attach notes to unvalidated device data ECG Heart Rate - Cannot attach notes to unvalidated device data BP - Cannot attach notes to unvalidated device data MAP (mmHg) - Cannot attach notes to unvalidated device data SpO2 - Cannot attach notes to unvalidated device data  RN aware of BP

## 2019-04-12 NOTE — Progress Notes (Signed)
Attempted to call patient's daughter x2 this afternoon without success. No VM left.

## 2019-04-12 NOTE — TOC Initial Note (Signed)
Transition of Care [facility]) - Initial/Assessment Note    Patient Details  Name: Norma [name] MRN: [id number] Date of Birth: 1945-10-18  [facility] CM/SW Contact:    Bary Castilla, LCSW Phone Number: [phone] 04/12/2019, 4:13 PM  Clinical Narrative:                  CSW met with patient in regards to SNF recommendation. Patient stated that she was aware of recommendation and she would like to go to [facility]. CSW asked if referrals could be sent to other local facilities just in case that [facility] did not have a bed. Patient was in agreement for referrals to be faxed out to local facilities.  CSW informed patient that [facility] ([facility]) team would follow up with patient in regards to bed offers once they are back.  TOC team will continue to follow for discharge planning.  Expected Discharge Plan: Skilled Nursing Facility Barriers to Discharge: Continued Medical Work up, SNF Pending bed offer   Patient Goals and CMS Choice   CMS Medicare.gov Compare Post Acute Care list provided to:: Patient    Expected Discharge Plan and Services Expected Discharge Plan: [place] arrangements for the past 2 months: Apartment                                      Prior Living Arrangements/Services Living arrangements for the past 2 months: Apartment Lives with:: Self Patient language and need for interpreter reviewed:: Yes Do you feel safe going back to the place where you live?: Yes               Activities of Daily Living      Permission Sought/Granted   Permission granted to share information with : Yes, Verbal Permission Granted  Share Information with NAME: [name]  Permission granted to share info w AGENCY: SNFs  Permission granted to share info w Relationship: daughter  Permission granted to share info w Contact Information: [phone]  Emotional Assessment Appearance:: Appears stated age Attitude/Demeanor/Rapport: Complaining Affect  (typically observed): Accepting Orientation: : Oriented to Self, Oriented to Place, Oriented to  Time, Oriented to Situation      Admission diagnosis:  Hypocalcemia [E83.51] Hypokalemia [E87.6] Hypoglycemia [E16.2] Pressure injury of skin, unspecified injury stage, unspecified location [L89.90] Patient Active Problem List   Diagnosis Date Noted  . Hypoglycemia 04/09/2019  . ESRD (end stage renal disease) ([place]) 04/09/2019  . Sacral decubitus ulcer 04/09/2019  . Venous stasis ulcer ([place]) 04/09/2019  . Anemia of chronic disease 08/27/2016  . Presence of right artificial knee joint 04/30/2016  . Postoperative wound infection of right hip 04/24/2016  . Prosthetic hip infection ([place]) 04/20/2016  . Degenerative arthritis of hip 03/06/2016  . Gout of left knee 10/31/2015  . Right hip pain 10/29/2015  . Acute pain of left knee 10/29/2015  . DM type 2 causing CKD stage 3 ([place]) 10/29/2015  . Morbid obesity ([place]) 10/29/2015  . CKD (chronic kidney disease) stage 4, GFR 15-29 ml/min (HCC) 04/18/2014  . Aortic atherosclerosis ([place]) 04/18/2014  . Bilateral carotid bruits 04/16/2014  . Pulsatile tinnitus of right ear 04/16/2014  . Diarrhea 09/18/2011  . Peripheral vascular disease, unspecified ([place]) 08/14/2011  . Claudication ([place]) 08/14/2011  . Edema 06/29/2011  . Leucocytosis 06/12/2011  . Acute on chronic kidney disease, stage 4 05/28/2011  .  Respiratory failure, acute ([place]) 05/25/2011    Class: Acute  . S/P aorto-bifemoral bypass surgery 05/25/2011    Class: Acute  . PAD (peripheral artery disease) ([place])   . Coronary artery disease   . Hyperlipidemia   . Hypertension    PCP:  Bartholome Bill, MD Pharmacy:   [facility] [place] [address] Phone: [phone] Fax: [phone]     Social Determinants of Health (SDOH) Interventions    Readmission Risk Interventions No flowsheet data  found.

## 2019-04-12 NOTE — Progress Notes (Signed)
  Date: 04/12/2019  Patient name: Norma [name]  Medical record number: [id number]  Date of birth: 05/19/1946        I have seen and evaluated this patient and I have discussed the plan of care with the house staff. Please see their note for complete details. I concur with their findings with the following additions/corrections: [name] was seen this morning on team rounds.  She was lying in bed and stated that she does not have much pain when she is left alone and not bothered with wound dressing changes.  Nursing notes from last night indicate she had delirium.  Her daughter is not at bedside.  Nephrology's notes which confirmed calciphylaxis, at least for some of the wounds.  Sodium thiosulfate has been started.  Our team will discuss with nephrology plans going forward, in particular whether she remains a hemodialysis candidate.  If we choose an aggressive course, she will need BID dressing changes and it is dressing changes that exacerbates her pain and really is the only thing she complains about.  We would also need surgical consult regarding the left fourth necrotic finger, right lateral hip wound, and possibly other wounds.  She is also exhibiting delirium at night and with combination of delirium, calciphylaxis, ESRD, extensive wounds, and her other medical issues all portend a poor prognosis, likely months to a year or so.  She, as I feel she is competent during the day to make her own medical decisions, and her daughter will need to be informed what aggressive care entails so they may make an informed decision.  Bartholomew Crews, MD 04/12/2019, 1:13 PM

## 2019-04-12 NOTE — Progress Notes (Signed)
Pharmacy Antibiotic Note  Norma [name] is a 73 y.o. female admitted on 04/09/2019 with hypoglycemia. Pharmacy has been consulted for vancomycin and cefepime dosing for sepsis.  Noted patient has sacral decubitus ulcer with possible osteo.  She also has ESRD on MWF HD, last session on 10/03  Afebrile, WBC 15.2.  Current weight is 72 kg (down from ~ 80). BP is wnls. Patient was able to tolerate HD 10/03 without any issues. Pt's HD schedule became off schedule due to more emergent cases in hospital on 10/02. Will likely be back on home HD schedule. Pt received vanc dose with HD and received cefepime on 10/02.   Plan: Continue 750mg  qHD MWF Give one dose of cefepime 2gm x 1 since no dose was given after HD Continue Cefepime 2gm IV qHD MWF Monitor HD schedule/tolerance, weight, clinical progress, vanc level as indicated  Height: 5\' 2"  (157.5 cm) Weight: 158 lb 11.7 oz (72 kg) IBW/kg (Calculated) : 50.1  Temp (24hrs), Avg:97.7 F (36.5 C), Min:97 F (36.1 C), Max:98.1 F (36.7 C)  Recent Labs  Lab 04/09/19 1000 04/09/19 1008 04/10/19 0201 04/11/19 0625 04/12/19 0335  WBC  --  15.7* 15.6* 13.3* 15.2*  CREATININE  --  2.32* 2.80* 3.71* 2.55*  LATICACIDVEN 2.2*  --   --   --   --     Estimated Creatinine Clearance: 18.3 mL/min (A) (by C-G formula based on SCr of 2.55 mg/dL (H)).    Allergies  Allergen Reactions  . Carnosine Other (See Comments)  . Sulfasalazine     UNSPECIFIED REACTION   . Iohexol Itching, Rash and Other (See Comments)     Code: RASH, Desc: pt called 1 day post scanning stating that skin was red and "itching all over" some what better but still had symptom.. instructed pt to take benadryl to relieve symptoms,per dr Alvester Chou.if any problems call back/mms, Onset Date: [id number]   . Sulfa Antibiotics Other (See Comments)    UNSPECIFIED REACTION     Vanc 10/1 >> Cefepime 10/1 >>  10/1 covid - negative 10/1 UCx - sent 10/1 BCx - ngtd  Sherren Kerns, PharmD PGY1  Acute Care Pharmacy Resident 04/12/2019, 11:03 AM

## 2019-04-12 NOTE — Progress Notes (Addendum)
Subjective:  Awoken from sleep reports  wound pain controlled with pain med's   Objective Vital signs in last 24 hours: Vitals:   04/12/19 0008 04/12/19 0354 04/12/19 0755 04/12/19 1153  BP: (!) 91/42  (!) 143/95 (!) 112/46  Pulse: 85 94 62 93  Resp: 17  16 15   Temp: 98.1 F (36.7 C) (!) 97 F (36.1 C) 97.8 F (36.6 C) 97.7 F (36.5 C)  TempSrc: Oral Oral Oral Oral  SpO2: 94%  94% 96%  Weight:      Height:       Weight change: 0.907 kg  Physical Exam: General: sleeping awoken, Alert chronically ill appearing  elderly female / NAD  Heart: irreg /stable rate , no M,R, G Lungs:CTA , no rales, wheezing,crackles,nonlabored  Abdomen:BS pos. Soft , NT, ND , No bruits or appreciated mass Extremities:Bilat Lower extrem. Wounds Left/  Thigh now with DuoDerm dressing  And lower extrem with DuoDerm  not removed  L  4th finger tip dry ulcer with dressing intact   Dialysis Access:R IJ Perm cath patent on HD  / LUA AVF No Bruit (sp Ligation )  OP Dialysis Orders:Adm farmon mwf. [id number] kgHD Bath 2k/ 2.25Time 4 hrHeparin NONE. AccessPerm cath ( LUA AVF Li gated 03/30/19)  Mircera 24mcg IV q 2wks/HD( last on 03/27/19) OtherNoted labs HGB 7.6 03/27/19 TFS 13% (03/25/19)  Problem/Plan: 1. ESRD -HD MWF schedule Off schedule today Saturday sec .yesterday  numerous more emergent cases in Hospital/ K = 3.1 give po k supplementation  /  vol stable  next hd Mon 10/05 / Using 2.0K bath Sec need for 2.0 ca bath with her Calciphylaxis / monitor  k closely( ho A Fib)   2. AMS Multifactorial= with Hypoglycemia/ weakness / Sacral and lower extrem Wounds deconditioning / Noted having AMS /delierum at Night per Admit team / RN team  3. Multiple Lower extremity wounds= Thigh wounds appear cw Calciphylaxis , NOTED 9/04 Derm Path report  =There are focal microcalcifications noted within the subcutaneous tissue; however, definitive features of calciphylaxis are not identified  However as examined and   dw Dr Jonnie Finner cw Calciphylaxis  Have  Started  Na thiosulfate  Use  Low ca bath and have to monitor K with use of 2k bath with lowest ca bath available , Because elderly and debilitated  will not do increased HD  / iv antibiotics /wound care team seeing/ Pain appears stable now but may need Palliative Care consult  If pain worsens The patient has numerous serious wounds, some of which are calciphylaxis-related and others which are not.  She appears malnourished and debilitated.  Chances are high that she will not survive all of this. Have d/w primary team.  Conversations about [place] and QOL would be beneficial.  It would not be unreasonable to consider transition to comfort care/ HD withdrawal given the severity of her condition.  If aggressive care (HD, wound care, etc) is desired, it would likely require LTAC transfer. Given her debility it is unlikely she could do outpt dialysis with the sitting and the transporting that go along.  4. Hypertension/volume - bp On low side / WIll dc LASIX( home meds) in ESRD pt (reports"" only making drops of Urine" 5. Anemia - HGB 6.9 to 9.3>9.2>8.9   after transfusion PRBCS  On admit / contin ESA in Hospital ( due Aranesp Masx dose next HD ) OP Trans Fer. Sat 13% 9(/`13/20 ) hold iv iron in setting of infection 6. Metabolic bone disease -  Calcium Acetate as Phos binder /hold binders dc all calcium based meds with Calciphylaxis type wounds  / No binder needed with Phos 1.6(give K phos supplement ) phos to 2.1 Ca corrected = 9.6 binder / No vit d as op  7. Atrial  Fib=wasOn Eliquis(held by admit 2/2 bleeding ulcersand Coreg  8. Nutrition -alb 1.5  Started  Protein supplementation   Ernest Haber, PA-C [facility] [phone] 04/12/2019,1:36 PM  LOS: 3 days   Pt seen, examined and agree w assess/plan as above with additions as indicated.  [facility] 04/12/2019, 4:12  PM     Labs: Basic Metabolic Panel: Recent Labs  Lab 04/10/19 0201 04/11/19 0625 04/12/19 0335  NA 134* 133* 139  K 3.8 4.3 3.1*  CL 95* 95* 96*  CO2 26 25 21*  GLUCOSE 166* 132* 165*  BUN 19 29* 19  CREATININE 2.80* 3.71* 2.55*  CALCIUM 7.8* 8.2* 8.2*  PHOS 1.6*  --  2.1*   Liver Function Tests: Recent Labs  Lab 04/09/19 1008 04/10/19 0201 04/12/19 0335  AST 20  --   --   ALT 6  --   --   ALKPHOS 83  --   --   BILITOT 0.8  --   --   PROT 4.6*  --   --   ALBUMIN 1.6* 1.7* 1.5*   No results for input(s): LIPASE, AMYLASE in the last 168 hours. No results for input(s): AMMONIA in the last 168 hours. CBC: Recent Labs  Lab 04/09/19 1008  04/10/19 0201 04/11/19 0625 04/12/19 0335  WBC 15.7*  --  15.6* 13.3* 15.2*  NEUTROABS 14.4*  --   --   --   --   HGB 6.9*   < > 9.3* 9.2* 8.9*  HCT 23.5*   < > 29.6* 30.1* 27.9*  MCV 101.7*  --  97.7 96.5 95.5  PLT 216  --  209 221 216   < > = values in this interval not displayed.   Cardiac Enzymes: No results for input(s): CKTOTAL, CKMB, CKMBINDEX, TROPONINI in the last 168 hours. CBG: Recent Labs  Lab 04/11/19 2115 04/12/19 0007 04/12/19 0408 04/12/19 0747 04/12/19 1123  GLUCAP 243* 219* 181* 178* 158*    Studies/Results: No results found. Medications: . ceFEPime (MAXIPIME) IV 2 g (04/10/19 2354)  . [START ON 04/13/2019] sodium thiosulfate infusion for calciphylaxis    . vancomycin 750 mg (04/11/19 1041)   . sodium chloride   Intravenous Once  . Chlorhexidine Gluconate Cloth  6 each Topical Daily  . darbepoetin (ARANESP) injection - DIALYSIS  200 mcg Intravenous Q Sat-HD  . feeding supplement (NEPRO CARB STEADY)  237 mL Oral BID BM  . feeding supplement (PRO-STAT SUGAR FREE 64)  30 mL Oral BID  . influenza vaccine adjuvanted  0.5 mL Intramuscular Tomorrow-1000  . multivitamin  1 tablet Oral QHS  . phosphorus  250 mg Oral BID  . sodium hypochlorite   Topical BID  . sodium hypochlorite   Topical BID  .  sodium hypochlorite   Topical BID  . sodium hypochlorite   Topical BID  . sodium hypochlorite   Topical BID  . sodium hypochlorite   Topical BID  . sodium hypochlorite   Topical BID

## 2019-04-13 LAB — GLUCOSE, CAPILLARY
Glucose-Capillary: 105 mg/dL — ABNORMAL HIGH (ref 70–99)
Glucose-Capillary: 155 mg/dL — ABNORMAL HIGH (ref 70–99)
Glucose-Capillary: 167 mg/dL — ABNORMAL HIGH (ref 70–99)
Glucose-Capillary: 170 mg/dL — ABNORMAL HIGH (ref 70–99)
Glucose-Capillary: 184 mg/dL — ABNORMAL HIGH (ref 70–99)

## 2019-04-13 LAB — CBC
HCT: 27.7 % — ABNORMAL LOW (ref 36.0–46.0)
Hemoglobin: 9.1 g/dL — ABNORMAL LOW (ref 12.0–15.0)
MCH: 29.9 pg (ref 26.0–34.0)
MCHC: 32.9 g/dL (ref 30.0–36.0)
MCV: 91.1 fL (ref 80.0–100.0)
Platelets: 221 10*3/uL (ref 150–400)
RBC: 3.04 MIL/uL — ABNORMAL LOW (ref 3.87–5.11)
RDW: 20 % — ABNORMAL HIGH (ref 11.5–15.5)
WBC: 13 10*3/uL — ABNORMAL HIGH (ref 4.0–10.5)
nRBC: 0.5 % — ABNORMAL HIGH (ref 0.0–0.2)

## 2019-04-13 LAB — RENAL FUNCTION PANEL
Albumin: 1.6 g/dL — ABNORMAL LOW (ref 3.5–5.0)
Anion gap: 21 — ABNORMAL HIGH (ref 5–15)
BUN: 37 mg/dL — ABNORMAL HIGH (ref 8–23)
CO2: 21 mmol/L — ABNORMAL LOW (ref 22–32)
Calcium: 8.5 mg/dL — ABNORMAL LOW (ref 8.9–10.3)
Chloride: 98 mmol/L (ref 98–111)
Creatinine, Ser: 3.24 mg/dL — ABNORMAL HIGH (ref 0.44–1.00)
GFR calc Af Amer: 16 mL/min — ABNORMAL LOW (ref >60)
GFR calc non Af Amer: 13 mL/min — ABNORMAL LOW (ref >60)
Glucose, Bld: 168 mg/dL — ABNORMAL HIGH (ref 70–99)
Phosphorus: 3 mg/dL (ref 2.5–4.6)
Potassium: 3.4 mmol/L — ABNORMAL LOW (ref 3.5–5.1)
Sodium: 140 mmol/L (ref 135–145)

## 2019-04-13 MED ORDER — HEPARIN SODIUM (PORCINE) 5000 UNIT/ML IJ SOLN
5000.0000 [IU] | Freq: Three times a day (TID) | INTRAMUSCULAR | Status: DC
  Administered 2019-04-13 – 2019-04-14 (×5): 5000 [IU] via SUBCUTANEOUS
  Filled 2019-04-13 (×5): qty 1

## 2019-04-13 MED ORDER — HEPARIN SODIUM (PORCINE) 1000 UNIT/ML IJ SOLN
INTRAMUSCULAR | Status: AC
  Filled 2019-04-13: qty 4

## 2019-04-13 MED ORDER — APIXABAN 5 MG PO TABS: 5.0000 mg | ORAL_TABLET | Freq: Two times a day (BID) | ORAL | Status: DC

## 2019-04-13 NOTE — Progress Notes (Signed)
Initial Nutrition Assessment  DOCUMENTATION CODES:   Not applicable  INTERVENTION:    Nepro Shake po BID, each supplement provides 425 kcal and 19 grams protein  30 ml Prostat BID, each supplement provides 100 kcals and 15 grams protein.  Renal MVI   NUTRITION DIAGNOSIS:   Increased nutrient needs related to wound healing as evidenced by estimated needs.  GOAL:   Patient will meet greater than or equal to 90% of their needs  MONITOR:   PO intake, Supplement acceptance, Weight trends, Labs, I & O's  REASON FOR ASSESSMENT:   Consult Assessment of nutrition requirement/status  ASSESSMENT:   Patient with PMH significant for ESRD on HD, HTN, DM, HTN, CAD, hepatitis C, decubitus sacral arthritis, and PVD. Presents this admission with hypoglycemia.   RD working remotely.  Per nephrology thigh wounds show signs of calciphylaxis.   Attempted to reach pt via phone, no answer. No meal completions documented this admission. Per MAR, pt had Prostat this am. Palliative to meet with family. Will continue supplements to maximize kcal/protein to promote wound healing.   EDW: 67 kg  Current weight: 72 kg   Per chart records, pt weighed 81 kg on 8/30 and 72 kg this admission. Will need to obtain weight/diet history if possible.   Drips: sodium thiosulfate Medications: aranesp, SS novolog, rena-vit, 10 mEq KCl Labs: K 3.4 (L) CBG 155-229    Diet Order:   Diet Order            Diet renal/carb modified with fluid restriction Diet-HS Snack? Snack-yes; Fluid restriction: 1200 mL Fluid; Room service appropriate? Yes; Fluid consistency: Thin  Diet effective now              EDUCATION NEEDS:   Not appropriate for education at this time  Skin:  Skin Assessment: Skin Integrity Issues: Skin Integrity Issues:: Incisions, Diabetic Ulcer, Stage III, Stage IV, Stage II, Unstageable, Other (Comment) Stage II: L ankle Stage III: sacrum Stage IV: R/L trochanter Unstageable: R leg,  ischial tuberosity Diabetic Ulcer: R pretibial Incisions: L arm/R chest Other: wounds- R thigh/L thigh, L fourth finger  Last BM:  10/3  Height:   Ht Readings from Last 1 Encounters:  04/09/19 5\' 2"  (1.575 m)    Weight:   Wt Readings from Last 1 Encounters:  04/11/19 72 kg    Ideal Body Weight:  50 kg  BMI:  Body mass index is 29.03 kg/m.  Estimated Nutritional Needs:   Kcal:  2050-2250 kcal  Protein:  105-120 grams  Fluid:  1000 ml + UOP   Mariana Single RD, LDN Clinical Nutrition Pager # [phone]

## 2019-04-13 NOTE — Progress Notes (Signed)
Attempted to call patient's daughter to set up a time to discuss goals of care. No answer. Left VM 6:50am asking daughter to let nursing know when a good time is to discuss her mother's ongoing care.

## 2019-04-13 NOTE — Progress Notes (Signed)
Subjective: Patient seen at bedside. She reports feeling tired and not being able to rest well in the hospital due to everyone poking and prodding her throughout the night. She reported feeling hungry so we assisted her in ordering some grits, eggs, and toast for breakfast. She does not report feeling short of breath or constipated.  The IMTS team returned after rounding to speak with the patient and her mother about goals of care. However, wound care was in the room at this time, and the patient was in significant discomfort as the dressings were changed. Afterwards, she stated she was tired and did not want to engage in or be present for a conversation, asking that we talk to her daughter elsewhere. We were able to talk to her daughter to communicate our understanding of the patient's poor prognosis and the treatment options going forward, including aggressive treatment vs hospice care. Patient's daughter voiced her understanding and stated that she had been wondering if this might be the case since she discovered her mother's wounds and started her on hemodialysis 2 months ago.  Objective: Vital signs in last 24 hours: Vitals:   04/13/19 1643 04/13/19 1700 04/13/19 1730 04/13/19 1800  BP: 107/70 111/78 112/88 (!) 98/34  Pulse: 91 89 97 92  Resp: 17     Temp:      TempSrc:      SpO2:      Weight:      Height:       Weight change:  No intake or output data in the 24 hours ending 04/13/19 1808   Physical exam:  General appearance: alert, appears stated age Head: Normocephalic, without obvious abnormality, atraumatic Lungs: clear to auscultation bilaterally Heart: normal rate, irregular rhythm Abdomen: soft, non-tender; bowel sounds normal Extremities: extremities normal, atraumatic, no cyanosis or edema Skin: Skin color, texture, turgor normal. Multiple wounds on back/sacrum, legs, and feet currently bandaged/covered and documented by wound care. Neurologic: Alert and oriented X 3   Lab Results: K: 3.4 Glucose: 168 Ca: 8.5 CO2: 21 Anion Gap: 21  Glucose: 168 WBC: 13.0 Hbg: 9.1 Hct: 27.7  Micro Results: Cultures from 10/1 NGTD  Medications: I have reviewed the patient's current medications. Scheduled Meds: . Chlorhexidine Gluconate Cloth  6 each Topical Daily  . darbepoetin (ARANESP) injection - DIALYSIS  200 mcg Intravenous Q Sat-HD  . feeding supplement (NEPRO CARB STEADY)  237 mL Oral BID BM  . feeding supplement (PRO-STAT SUGAR FREE 64)  30 mL Oral BID  . heparin  5,000 Units Subcutaneous Q8H  . insulin aspart  1 Units Subcutaneous TID AC  . multivitamin  1 tablet Oral QHS  . potassium chloride  10 mEq Oral BID   Continuous Infusions: . ceFEPime (MAXIPIME) IV 2 g (04/10/19 2354)  . sodium thiosulfate infusion for calciphylaxis    . vancomycin 750 mg (04/11/19 1041)   PRN Meds:.acetaminophen, diphenhydrAMINE-zinc acetate, HYDROcodone-acetaminophen, ondansetron (ZOFRAN) IV, traMADol Assessment/Plan: Principal Problem:   Hypoglycemia Active Problems:   Hypertension   Peripheral vascular disease, unspecified (HCC)   CKD (chronic kidney disease) stage 4, GFR 15-29 ml/min (HCC)   Anemia of chronic disease   ESRD (end stage renal disease) ([place])   Sacral decubitus ulcer   Venous stasis ulcer ([place])  # Hypoglycemia: Blood sugars stable overnight in the high 100s. She is on a carb-modified diet and not receiving any insulin at this time. Given that she is a ESRD patient, there is no need to strictly regulate her BG. Will start with  very sensitive SSI at 250 with 1 unit.  - Very sensitive, customized SSI only before meals, no post meal or bedtime correction - Monitor CBGs every 4 hours -Renal/carb modifieddiet   # ESRD (MWF) # Calciphylaxis Recently started on HD approximately 5 weeks ago. ESRD most likely secondary to poorly controlled diabetes and hypertension.Nephrology confirms that ulceration on left thigh is characteristic ofcalciphylaxis,  which significantly increases mortality rate. With Ms. Lamm frail state, multiple severe decubitus ulcers, and now development of calciphylaxis, she may not be the best candidate for continued HD. Discussed with patient's daughter that prognosis is likely 3-6 mo with aggressive treatment (continued HD, twice a day wound changes, etc) vs several weeks if comfort care is prioritized. Discussed this prognosis with nephrology, who recommended consulting palliative care if the patient opts for any kind of hospice care. Daughter feels that inpatient hospice would best line up with her mother's and her family's wishes given that her mother agrees; we plan to talk with the patient tomorrow at 3:20.  -Nephrology is following and we appreciate their recommendations - Sodium thiosulate for calciphylaxis - HDtoday - Electrolytes per Nephro - BMP - continue Goals of Care discussion with patient tomorrow  # Persistent Atrial fibrillation: Continue to persist but is rate controlled at this time. Echocardiogramshowed decreased EF to 40-45% down from 60-65% in 2014 -Hold Eliquis due to ongoing blood lossfrom ulcers -Holdingcarvedilol 12.5 mggiven low BP  # Anemia: Anemia likely due to chronic kidney disease. Hemoglobin remains stable. It is possible that initial low hemoglobin requiring pRBCs may have been falsely low.  - Daily CBC - Continue Aranesp (darbepoetin alba) during dialysis. - Transfuse if symptomatic and/or below 7.0  # Sacraldecubitus ulcers (bilateral) # Lower extremity ulcers (bilateral)  # Leukocytosis  Patient has bilateral sacral decubitus ulcers that are both quite advanced. Wound care is following and rebandaged the wounds. Patient is followed outpatient for wound care by Dr[facility] Dellia Nims at [facility]. She has recently had wound cultures but does not appear to have had imaging to work up possible osteomyelitis. Outpatient wound care believes that the sacral decubitus ulcers may be  due to panniculitis. Biopsy from 9/20 showed significant fat necrosis on in right hip wound without definitive features of calciphylaxis and focal fat necrosis in the right and left thighs. Some of her leg ulcers appear to bedue to venous stasis given dark appearance and setting of diminished pulses/poor circulation; this was supported by 9/4 biopsy.  WBC continues to stay elevated although patient is a febrile. Blood cultures remain negative. Hypotensive events most likely due to HD but given her compromised state, plan to continue antibiotics at this time.  - Vancomycin per pharmacy  - Cefepime per pharmacy   # Hypotension: Stable today in 100's/70-80's. Likely secondary to HD given persistent drop after dialysis vs infection from skin source given elevated WBCs. Holding home BP meds.  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 Wound care has dressed the wound. She is not a stable surgical candidate at this time and does see vascular surgery outpatient, so no further intervention at this time.    This is a Careers information officer Note.  The care of the patient was discussed with Dr. Lynnae January and the assessment and plan formulated with their assistance.  Please see their attached note for official documentation of the daily encounter.   LOS:  4 days   Lennie Hummer, Medical Student 04/13/2019, 6:08 PM

## 2019-04-13 NOTE — Progress Notes (Signed)
Pt refusing bath, repositioning, pad change, and gown change (gown wet with water) after dialysis. Will reattempt in AM. Agreeable to meds this evening. Will continue to monitor.

## 2019-04-13 NOTE — Care Management Important Message (Signed)
Important Message  Patient Details  Name: Norma [name] MRN: [id number] Date of Birth: 06/24/1946   Medicare Important Message Given:  Yes     [name] 04/13/2019, 2:08 PM

## 2019-04-13 NOTE — Progress Notes (Signed)
[facility] Progress Note   Assessment/ Plan:   OP Dialysis Orders:Adm farmon mwf. JX:[id number] kgHD Bath 2k/ 2.25Time 4 hrHeparin NONE. AccessPerm cath ( LUA AVF Li gated 03/30/19)  Mircera 258mcg IV q 2wks/HD( last on 03/27/19) OtherNoted labs HGB 7.6 03/27/19 TFS 13% (03/25/19)  Problem/Plan: 1. ESRD -HD MWF scheduleOff schedule Saturday, back on sched today 10/5.  2. AMS Multifactorial: hypoglycemia + delirium  3. 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 However, clinically appear to be calciphylaxis related.  Started sodium thiosulfate.  GOC and QOL life discussions most appropriate here as would pall care c/s if not already obtained.  Of note, pt's dtr is also on dialysis so this may be especially hard for the family.  4. Hypertension/volume - bp On low side, Lasix d/c'd  5. Anemia - HGB 6.9 to 9.3>9.2>8.9  after transfusion PRBCS  On admit / contin ESA in Hospital ( due Aranesp Masx dose next HD ) OP Trans Fer. 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 Metabolic bone disease: phos of the low side, avoid all Ca-containing binders  7. Atrial  Fib=wasOn Eliquis(held by admit 2/2 bleeding ulcers)and Coreg, recommend avoiding all warfarin products  8. Nutrition -alb 1.5  Started  Protein supplementation  Subjective:    NAD, lying in bed, NAD   Objective:   BP (!) 101/43 (BP Location: Right Wrist)   Pulse 88   Temp 98.1 F (36.7 C) (Oral)   Resp 15   Ht 5\' 2"  (1.575 m)   Wt 72 kg   SpO2 100%   BMI 29.03 kg/m   Physical Exam: Gen: older woman, sitting in bed NAD CVS: irregular Resp: clear anteriorly Abd: obese Ext: LE edema greatly improved, wounds just freshly dressed ACCESS:  [facility]  Labs: BMET Recent Labs  Lab 04/09/19 1008 04/10/19 0201 04/11/19 0625 04/12/19 0335 04/13/19 0412  NA 137 134* 133* 139 140  K 3.2* 3.8 4.3 3.1* 3.4*  CL 97* 95* 95* 96* 98  CO2 29 26 25  21* 21*  GLUCOSE 148* 166* 132* 165* 168*  BUN 12 19 29* 19 37*  CREATININE 2.32* 2.80* 3.71* 2.55* 3.24*  CALCIUM 7.6* 7.8* 8.2* 8.2* 8.5*  PHOS  --  1.6*  --  2.1* 3.0   CBC Recent Labs  Lab 04/09/19 1008  04/10/19 0201 04/11/19 0625 04/12/19 0335 04/13/19 0412  WBC 15.7*  --  15.6* 13.3* 15.2* 13.0*  NEUTROABS 14.4*  --   --   --   --   --   HGB 6.9*   < > 9.3* 9.2* 8.9* 9.1*  HCT 23.5*   < > 29.6* 30.1* 27.9* 27.7*  MCV 101.7*  --  97.7 96.5 95.5 91.1  PLT 216  --  209 221 216 221   < > = values in this interval not displayed.    @IMGRELPRIORS @ Medications:    . sodium chloride   Intravenous Once  . Chlorhexidine Gluconate Cloth  6 each Topical Daily  . darbepoetin (ARANESP) injection - DIALYSIS  200 mcg Intravenous Q Sat-HD  . feeding supplement (NEPRO CARB STEADY)  237 mL Oral BID BM  . feeding supplement (PRO-STAT SUGAR FREE 64)  30 mL Oral BID  . insulin aspart  1 Units Subcutaneous TID AC  . multivitamin  1 tablet Oral QHS  . potassium chloride  10 mEq Oral BID     Madelon Lips, MD 04/13/2019, 10:28 AM

## 2019-04-13 NOTE — Progress Notes (Signed)
°  Date: 04/13/2019  Patient name: Norma [name]  Medical record number: [id number]  Date of birth: [birth year]        I have seen and evaluated this patient and I have discussed the plan of care with the house staff. Please see their note for complete details. I concur with their findings with the following additions/corrections: I saw [name] this morning on team rounds.  She was comfortable and interactive.  We assisted her with ordering her breakfast.  The team had not been able to make contact with her daughter to arrange [place] conversation.  We returned later in the day but unfortunately, [name] had just finished an hour-long dressing change session and was very uncomfortable.  She stated at least twice that we leave so that she could rest.  She gave [place] permission to speak to her daughter which we did so in the unit waiting room and privacy.  We discussed with the daughter [name]'s current medical conditions and the worsening of her wounds despite intensive intervention as an outpatient.  [name] daughter had read about calciphylaxis and knew its poor outcome.  We then discussed options going forward and what an aggressive treatment course would look like but that consensus was that she would only live another 3 to 6 months even with aggressive intervention.  We also discussed what a comfort care approach would be like.  [name] daughter knew about hospice as she had gotten a brochure about it and thought it sounded like a wonderful program for her mother.  [name] daughter already had an inkling not her mother was end-of-life and our discussion did not surprise, upset, or anger her.  We told the daughter that we would discuss the options directly with [name] on the sixth as it is ultimately her decision.  We also said we would verify that inpatient hospice provided private rooms as compared to SNF with hospice.  Tomorrow, we also need to clarify CODE STATUS with [name].  I spoke to  Dr. Hollie Salk of nephrology to update her.  The team and I spent an hour discussing plans with [name]'s daughter, directly with [name], and coordinating her care.  Bartholomew Crews, MD 04/13/2019, 4:28 PM

## 2019-04-14 LAB — RENAL FUNCTION PANEL
Albumin: 1.6 g/dL — ABNORMAL LOW (ref 3.5–5.0)
Anion gap: 21 — ABNORMAL HIGH (ref 5–15)
BUN: 27 mg/dL — ABNORMAL HIGH (ref 8–23)
CO2: 21 mmol/L — ABNORMAL LOW (ref 22–32)
Calcium: 8.3 mg/dL — ABNORMAL LOW (ref 8.9–10.3)
Chloride: 96 mmol/L — ABNORMAL LOW (ref 98–111)
Creatinine, Ser: 2.5 mg/dL — ABNORMAL HIGH (ref 0.44–1.00)
GFR calc Af Amer: 21 mL/min — ABNORMAL LOW (ref >60)
GFR calc non Af Amer: 18 mL/min — ABNORMAL LOW (ref >60)
Glucose, Bld: 139 mg/dL — ABNORMAL HIGH (ref 70–99)
Phosphorus: 2.8 mg/dL (ref 2.5–4.6)
Potassium: 5 mmol/L (ref 3.5–5.1)
Sodium: 138 mmol/L (ref 135–145)

## 2019-04-14 LAB — CULTURE, BLOOD (ROUTINE X 2)
Culture: NO GROWTH
Culture: NO GROWTH
Special Requests: ADEQUATE

## 2019-04-14 LAB — CBC
HCT: 29.8 % — ABNORMAL LOW (ref 36.0–46.0)
Hemoglobin: 9.6 g/dL — ABNORMAL LOW (ref 12.0–15.0)
MCH: 29.9 pg (ref 26.0–34.0)
MCHC: 32.2 g/dL (ref 30.0–36.0)
MCV: 92.8 fL (ref 80.0–100.0)
Platelets: 174 10*3/uL (ref 150–400)
RBC: 3.21 MIL/uL — ABNORMAL LOW (ref 3.87–5.11)
RDW: 20.3 % — ABNORMAL HIGH (ref 11.5–15.5)
WBC: 13.8 10*3/uL — ABNORMAL HIGH (ref 4.0–10.5)
nRBC: 0.9 % — ABNORMAL HIGH (ref 0.0–0.2)

## 2019-04-14 LAB — GLUCOSE, CAPILLARY
Glucose-Capillary: 128 mg/dL — ABNORMAL HIGH (ref 70–99)
Glucose-Capillary: 133 mg/dL — ABNORMAL HIGH (ref 70–99)
Glucose-Capillary: 140 mg/dL — ABNORMAL HIGH (ref 70–99)
Glucose-Capillary: 144 mg/dL — ABNORMAL HIGH (ref 70–99)
Glucose-Capillary: 149 mg/dL — ABNORMAL HIGH (ref 70–99)

## 2019-04-14 NOTE — TOC Progression Note (Signed)
Transition of Care [facility]) - Progression Note    Patient Details  Name: DAMAYAH KIGHTLINGER MRN: [id number] Date of Birth: 06/15/1946  Transition [facility]) CM/SW [place], [place] Phone Number: 04/14/2019, 5:28 PM  Clinical Narrative:     4:50pm CSW visit with the patient at bedside. Patient's daughter,Latecia was also present in the room. CSW discussed possible placements for SNF.  CSW explained there are a limited number of SNFs that accepts dialysis patients.  CSW gave patient's family bed offers. CSW called [facility] -they not accepting dialysis patients at this time. [facility] declined patient.   5:37pm CSW called and confirmed [facility] bed offer and they can transport patient to dialysis. CSW called and left voice message with the patients daughter. CSW will wait for daughter to confirm she will accept offer.   Thurmond Butts, MSW, [facility] Clinical Social Worker [phone]   Expected Discharge Plan: Skilled Nursing Facility Barriers to Discharge: Continued Medical Work up, SNF Pending bed offer  Expected Discharge Plan and Services Expected Discharge Plan: [place] arrangements for the past 2 months: Apartment                                       Social Determinants of Health (SDOH) Interventions    Readmission Risk Interventions No flowsheet data found.

## 2019-04-14 NOTE — Progress Notes (Signed)
PT Cancellation Note  Patient Details Name: Norma [name] MRN: [id number] DOB: 1946/02/23   Cancelled Treatment:    Reason Eval/Treat Not Completed: Patient declined, no reason specified;Fatigue/lethargy limiting ability to participate Pt irritated that she cannot get any sleep, "that nurse just left and now you are here,  I just want to rest." Declining therapy services this AM. Will follow.   Marguarite Arbour A Zairah Arista 04/14/2019, 8:30 AM Wray Kearns, PT, DPT Acute Rehabilitation Services Pager [phone] Office [phone]

## 2019-04-14 NOTE — Progress Notes (Signed)
Subjective: Ms. Jochimsen was seen at bedside. We explained the option to go to inpatient hospice rather than a SNF and she firmly stated that she did not want to discuss the topic until her daughter was present so that they could make a decision together that was best for the family. She listed the family members that would be impacted by this decision, including her 73 year old mother. It is not known whether her mother is still alive or whether the patient's mental status was altered. She did not appear pleased to see [place] and asked [place] to go away so that she could rest.   Objective: Vital signs in last 24 hours: Vitals:   04/14/19 0354 04/14/19 0540 04/14/19 0545 04/14/19 0928  BP: 118/73   (!) 136/42  Pulse: 73 86 94 95  Resp: 20     Temp: 97.6 F (36.4 C)   97.8 F (36.6 C)  TempSrc: Oral   Oral  SpO2: 94%   95%  Weight: 49 kg     Height:       Weight change:   Intake/Output Summary (Last 24 hours) at 04/14/2019 1350 Last data filed at 04/14/2019 1000 Gross per 24 hour  Intake 30 ml  Output -64 ml  Net 94 ml    Physical Exam: Not performed - patient asked the team to leave the room.  Lab Results: K: 5.0 Albumin: 1.6 Ca: 8.3; corrects to 9.92 when hypoalbuminemia considered. WBC 13.8 Hbg: 9.6  Hct: 29.8 CBG: ranged from 105-170 overnight  Micro Results: Cultures from 10/1 NGTD  Studies/Results: No new results   Medications: I have reviewed the patient's current medications. Scheduled Meds: . Chlorhexidine Gluconate Cloth  6 each Topical Daily  . darbepoetin (ARANESP) injection - DIALYSIS  200 mcg Intravenous Q Sat-HD  . feeding supplement (NEPRO CARB STEADY)  237 mL Oral BID BM  . feeding supplement (PRO-STAT SUGAR FREE 64)  30 mL Oral BID  . heparin  5,000 Units Subcutaneous Q8H  . insulin aspart  1 Units Subcutaneous TID AC  . multivitamin  1 tablet Oral QHS   Continuous Infusions: . ceFEPime (MAXIPIME) IV Stopped (04/13/19 2056)  . sodium thiosulfate  infusion for calciphylaxis    . vancomycin Stopped (04/13/19 2351)   PRN Meds:.acetaminophen, diphenhydrAMINE-zinc acetate, HYDROcodone-acetaminophen, ondansetron (ZOFRAN) IV, traMADol Assessment/Plan: Principal Problem:   Hypoglycemia Active Problems:   Hypertension   Peripheral vascular disease, unspecified (HCC)   CKD (chronic kidney disease) stage 4, GFR 15-29 ml/min (HCC)   Anemia of chronic disease   ESRD (end stage renal disease) (HCC)   Sacral decubitus ulcer   Venous stasis ulcer ([place])  #ESRD(MWF) # Calciphylaxis ESRD most likely secondary to poorly controlled diabetes and hypertension; started on HDapproximately 5 weeks ago, with last session yesterday evening. Given frail state, multiple severe decubitus ulcers, and calciphylaxis, nephrology estimates prognosis to be 3-6 months with aggressive treatment or a few weeks if HD/curative wound therapy are stopped and comfort care is pursued. Daughter feels that inpatient hospice would best line up with her mother's and her family's wishes given that her mother agrees; we plan to talk with the patient today at 3:20 as she preferred not to converse about this until the family could be together to decide with her. Patient retains decision-making capacity up to this point, though made a comment about wanting to talk to her 53 year old mother that should be confirmed not to be confabulation/AMS.  -Nephrology is followingand we appreciate their recommendations -  Sodium thiosulate for calciphylaxis - HDtomorrow -Electrolytes per Nephro -- held K this morning given morning value of 5.0. -daily BMP - continue Goals of Care discussionwith patient and family with likely dispo to inpatient hospice.  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   WBC count remains elevated although patient is afebrile. Blood cultures and prior wound cultures were negative. Hypotensive events most likely due to HD but given her compromised state, but plan to continue antibiotics at this time given elecated WBC count and high risk for infection.  Given ESRD and poor prognosis (see above) as well as patient asking not to be bothered, patient may move to hospice care, in which case antibiotics should be discontinued and frequency of dressing changes diminished. 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  # PersistentAtrial fibrillation: Irregular rhythm persists, but is rate controlled at this time.Will discontinue telemetry given that patient has had good rate control and is asking for fewer interventions. Given this change, patient is a good candidate for the floor. - D/c telemetry - Transfer off the progressive care unit onto the floor. -Hold Eliquis due to ongoing blood lossfrom ulcers -Hold homecarvedilol 12.5mg given low BP  #Hypoglycemia: Blood sugars stable overnight105-170. She is on a carb-modified diet and not receiving any insulin at this time. Given that she is a ESRD patient, there is no need to strictly regulate her BG. Will start with very sensitive SSI at 250 with 1 unit.  - Very sensitive, customized SSI only before meals, no post meal or bedtime correction -Monitor CBGs every 4 hours -Renal/carb modifieddiet   #Hypotension: Stable today in 100's/70-80's. Likely secondary to HD given persistent drop after dialysis vs infection from skin source given  elevated WBCs. Holding home BP meds. -Holdclonidine 0.5 mg twice daily -Holdcarvedilol 12 5 mg twice daily  #Anemia: Anemia likely due to chronic kidney disease.Hemoglobin remains stable. It is possible that initial low hemoglobin requiring pRBCs may have been falsely low. -Daily CBC -Continue Aranesp (darbepoetin alba) during dialysis. -Transfuse if symptomatic and/or below 7.0  This is a Careers information officer Note.  The care of the patient was discussed with Dr. Lynnae January and the assessment and plan formulated with their assistance.  Please see their attached note for official documentation of the daily encounter.   LOS: 5 days   Lennie Hummer, Medical Student 04/14/2019, 1:50 PM

## 2019-04-14 NOTE — Progress Notes (Signed)
Pt's wound dressings were changed per order without difficulty.  Will continue to monitor.

## 2019-04-14 NOTE — Progress Notes (Signed)
[facility] Progress Note   Assessment/ Plan:   OP Dialysis Orders:Adm farmon mwf. JX:[id number] kgHD Bath 2k/ 2.25Time 4 hrHeparin NONE. AccessPerm cath ( LUA AVF Li gated 03/30/19)  Mircera 270mcg IV q 2wks/HD( last on 03/27/19) OtherNoted labs HGB 7.6 03/27/19 TFS 13% (03/25/19)  Problem/Plan: 1. ESRD -HD MWF scheduleOff schedule Saturday, back on sched 10/5, next planned 10/7 (will place for second shift if there is a change in [place]).    2. AMS Multifactorial: hypoglycemia + delirium  3. 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 However, clinically appear to be calciphylaxis related.  Started sodium thiosulfate.  GOC and QOL life discussions most appropriate here as would pall care c/s if not already obtained--> greatly appreciate the primary team.  Will be hopefully having conversation with pt herself re: hospice today.  Dtr Donald Prose involved in her care.    4. Hypertension/volume - bp On low side, Lasix d/c'd  5. Anemia - HGB 6.9 to 9.3>9.2>8.9  after transfusion PRBCS  On admit / contin ESA in Hospital ( due Aranesp Masx dose next HD ) OP Trans Fer. 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 Metabolic bone disease: phos of the low side, avoid all Ca-containing binders  7. Atrial  Fib=wasOn Eliquis(held by admit 2/2 bleeding ulcers)and Coreg, recommend avoiding all warfarin products  8. Nutrition -alb 1.5  Started  Protein supplementation.  9. Dispo: clarifying GOC  Subjective:    Uncomfortable and moaning in pain this AM.  Can't get comfortable.  Primary team d/w dtr yesterday, possible hospice conversation with pt today.     Objective:   BP (!) 136/42 (BP Location: Right Wrist) Comment: notified rn  Pulse 95    Temp 97.8 F (36.6 C) (Oral)   Resp 20   Ht 5\' 2"  (1.575 m)   Wt 49 kg Comment: took everything off the bed  SpO2 95%   BMI 19.76 kg/m   Physical Exam: Gen: older woman, sitting in bed, moaning and shifting in bed, can't get comfortable.   CVS: irregular Resp: clear anteriorly Abd: obese Ext: LE edema greatly improved, wounds dressed--> these are extensive and there are new wounds on her forearms (new since the last time I saw her ~6-8 weeks ago) ACCESS: [facility]  Labs: BMET Recent Labs  Lab 04/09/19 1008 04/10/19 0201 04/11/19 0625 04/12/19 0335 04/13/19 0412 04/14/19 0207  NA 137 134* 133* 139 140 138  K 3.2* 3.8 4.3 3.1* 3.4* 5.0  CL 97* 95* 95* 96* 98 96*  CO2 29 26 25  21* 21* 21*  GLUCOSE 148* 166* 132* 165* 168* 139*  BUN 12 19 29* 19 37* 27*  CREATININE 2.32* 2.80* 3.71* 2.55* 3.24* 2.50*  CALCIUM 7.6* 7.8* 8.2* 8.2* 8.5* 8.3*  PHOS  --  1.6*  --  2.1* 3.0 2.8   CBC Recent Labs  Lab 04/09/19 1008  04/11/19 0625 04/12/19 0335 04/13/19 0412 04/14/19 0207  WBC 15.7*   < > 13.3* 15.2* 13.0* 13.8*  NEUTROABS 14.4*  --   --   --   --   --   HGB 6.9*   < > 9.2* 8.9* 9.1* 9.6*  HCT 23.5*   < > 30.1* 27.9* 27.7* 29.8*  MCV 101.7*   < > 96.5 95.5 91.1 92.8  PLT 216   < > 221 216  221 174   < > = values in this interval not displayed.    @IMGRELPRIORS @ Medications:    . Chlorhexidine Gluconate Cloth  6 each Topical Daily  . darbepoetin (ARANESP) injection - DIALYSIS  200 mcg Intravenous Q Sat-HD  . feeding supplement (NEPRO CARB STEADY)  237 mL Oral BID BM  . feeding supplement (PRO-STAT SUGAR FREE 64)  30 mL Oral BID  . heparin  5,000 Units Subcutaneous Q8H  . insulin aspart  1 Units Subcutaneous TID AC  . multivitamin  1 tablet Oral QHS     Madelon Lips, MD 04/14/2019, 11:43 AM

## 2019-04-14 NOTE — Progress Notes (Addendum)
Pt's R lower leg, right thigh x2, left thigh, sacral, and bilateral hip dressings all changed this AM per orders.   Of note, R ischial tuberosity wound w/ moderate amount of sanguinous drainage that saturated gauze and bed pad. Some congealed blood in wound, attempted to flush out. Pt does not tolerate dressing changes well at all.

## 2019-04-15 DIAGNOSIS — Z66 Do not resuscitate: Secondary | ICD-10-CM

## 2019-04-15 DIAGNOSIS — Z515 Encounter for palliative care: Secondary | ICD-10-CM

## 2019-04-15 LAB — CBC
HCT: 28.1 % — ABNORMAL LOW (ref 36.0–46.0)
Hemoglobin: 9.2 g/dL — ABNORMAL LOW (ref 12.0–15.0)
MCH: 30.3 pg (ref 26.0–34.0)
MCHC: 32.7 g/dL (ref 30.0–36.0)
MCV: 92.4 fL (ref 80.0–100.0)
Platelets: 183 10*3/uL (ref 150–400)
RBC: 3.04 MIL/uL — ABNORMAL LOW (ref 3.87–5.11)
RDW: 21.1 % — ABNORMAL HIGH (ref 11.5–15.5)
WBC: 13.5 10*3/uL — ABNORMAL HIGH (ref 4.0–10.5)
nRBC: 1.6 % — ABNORMAL HIGH (ref 0.0–0.2)

## 2019-04-15 LAB — BASIC METABOLIC PANEL
Anion gap: 16 — ABNORMAL HIGH (ref 5–15)
BUN: 38 mg/dL — ABNORMAL HIGH (ref 8–23)
CO2: 21 mmol/L — ABNORMAL LOW (ref 22–32)
Calcium: 8.3 mg/dL — ABNORMAL LOW (ref 8.9–10.3)
Chloride: 100 mmol/L (ref 98–111)
Creatinine, Ser: 3.3 mg/dL — ABNORMAL HIGH (ref 0.44–1.00)
GFR calc Af Amer: 15 mL/min — ABNORMAL LOW (ref >60)
GFR calc non Af Amer: 13 mL/min — ABNORMAL LOW (ref >60)
Glucose, Bld: 138 mg/dL — ABNORMAL HIGH (ref 70–99)
Potassium: 4.7 mmol/L (ref 3.5–5.1)
Sodium: 137 mmol/L (ref 135–145)

## 2019-04-15 LAB — GLUCOSE, CAPILLARY
Glucose-Capillary: 132 mg/dL — ABNORMAL HIGH (ref 70–99)
Glucose-Capillary: 137 mg/dL — ABNORMAL HIGH (ref 70–99)
Glucose-Capillary: 140 mg/dL — ABNORMAL HIGH (ref 70–99)
Glucose-Capillary: 143 mg/dL — ABNORMAL HIGH (ref 70–99)

## 2019-04-15 MED ORDER — VANCOMYCIN HCL IN DEXTROSE 750-5 MG/150ML-% IV SOLN: 750.0000 mg | INTRAVENOUS | Status: DC

## 2019-04-15 MED ORDER — VANCOMYCIN HCL IN DEXTROSE 500-5 MG/100ML-% IV SOLN
500.0000 mg | INTRAVENOUS | Status: DC
  Filled 2019-04-15: qty 100

## 2019-04-15 MED ORDER — APIXABAN 5 MG PO TABS: 5.0000 mg | ORAL_TABLET | Freq: Two times a day (BID) | ORAL | Status: DC

## 2019-04-15 MED ORDER — CARVEDILOL 12.5 MG PO TABS: 12.5000 mg | ORAL_TABLET | ORAL | Status: DC

## 2019-04-15 NOTE — Progress Notes (Signed)
Subjective: Norma [name] was seen at bedside. She appeared tired but was willing to talk about her dialysis and placement in a SNF for continued HD and aggressive therapy. She asked if HD could be doen tomorrow instead of today. Per nursing note, she refused her dressing change from wound care this morning. She continues to state that she just wants to rest.  Later in the morning after seeing Norma [name], I spoke with the palliative care nurse who had just spoken with the patient and her family. She informed me that after further discussion, the patient had changed her decision to full comfort care, and her code status to DNR.  Objective: Vital signs in last 24 hours: Vitals:   04/15/19 0700 04/15/19 0851 04/15/19 1140 04/15/19 1208  BP:  (!) 119/46  128/72  Pulse: (!) 108 97 78 96  Resp: (!) 25 (!) 29 20 18   Temp:  97.6 F (36.4 C)    TempSrc:  Oral    SpO2: 96% 98% 96% 100%  Weight:      Height:       Weight change: -29 kg  Intake/Output Summary (Last 24 hours) at 04/15/2019 1514 Last data filed at 04/14/2019 2130 Gross per 24 hour  Intake 100 ml  Output -  Net 100 ml   Physical exam:  Patient declined physical exam.  Lab Results: I have reviewed the patient's labs, which are stable and consistent with prior days.   Micro Results: No new micro results to review.   Studies/Results: No new studies/results to review.  Medications: I have reviewed the patient's current medications. Scheduled Meds: . Chlorhexidine Gluconate Cloth  6 each Topical Daily  . feeding supplement (NEPRO CARB STEADY)  237 mL Oral BID BM  . 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 Per nephrology, prognosis with continued aggressive treatment 3-6 months. Patient has become increasingly likely to refuse treatment, asking to put off dialysis and repeatedly verbalizing that she would like to be left alone to rest. Given that this did not seem consistent with her decision to pursue aggressive treatment, palliative care spoke with the patient and her family again and after further discussion of goals of care, patient and family decided to switch course to pursue comfort care only, with a plan to discharge to residential hospice. Code status was changed to DNR. - palliative care is following, appreciate their recommendations - d/c dialysis - d/c sodium thiosulfate - no further life-prolonging measures including labs, diagnostics, antibiotics. - wound care to change dressings q3days or prn - diet of choice - symptom management with frequent pain assessment - discharge to inpatient hospice for EOL care   This is a Medical Student Note.  The care of the patient was discussed with Dr. Lynnae January and the assessment and plan formulated with their assistance. Please see their attached note for  official documentation of the daily encounter.   LOS: 6 days   Lennie Hummer, Medical Student 04/15/2019, 3:14 PM

## 2019-04-15 NOTE — Progress Notes (Signed)
Noted plan is for comfort care and no further dialysis as detailed in PMT consult note.  Will d/c current dialysis orders and have notified HD staff.  We will sign off. Please call with any questions or concerns.  Madelon Lips MD [facility] pgr [phone]

## 2019-04-15 NOTE — TOC Progression Note (Signed)
Transition of Care [facility]) - Progression Note    Patient Details  Name: Norma [name] MRN: [id number] Date of Birth: December 14, 1945  [facility] CM/SW [place], [place] Phone Number: 04/15/2019, 2:29 PM  Clinical Narrative:     2:01pm- Hospice referral made to [facility] for [facility].   1:42pm- called LVM w/Jennifer for Hospice referral    CSW will continue to follow and assist with discharge planning.  Thurmond Butts, MSW, [facility] Clinical Social Worker [phone]    Expected Discharge Plan: Skilled Nursing Facility Barriers to Discharge: Continued Medical Work up, SNF Pending bed offer  Expected Discharge Plan and Services Expected Discharge Plan: [place] arrangements for the past 2 months: Apartment                                       Social Determinants of Health (SDOH) Interventions    Readmission Risk Interventions No flowsheet data found.

## 2019-04-15 NOTE — Progress Notes (Signed)
Pt refused to have her dressings changed this AM.  Stated she wanted to be left alone.  Will inform on coming nurse.  Lupita Dawn, RN

## 2019-04-15 NOTE — Consult Note (Signed)
Consultation Note Date: 04/15/2019   Patient Name: Norma [name]  DOB: 1945/11/18  MRN: [id number]  Age / Sex: 73 y.o., female  PCP: Norma Bill, MD Referring Physician: Bartholomew Crews, MD  Reason for Consultation: Establish GOCs and emotional support  HPI/Patient Profile: 73 y.o. female   admitted on 04/09/2019 with a PMHx notable for ESRD on (MWF) secondary to longstanding poorly controlled hypertension, diabetes, diabetes mellitus type 2, HTN, CAD, gout, hepatitis C status post treatment with [place]ick, decubitus sacral arthritis, and peripheral artery disease who presents to the ED due to persistent hypoglycemia and altered mental status.    Per her family she has had significant physical, functional and cognitive decline over the past 2 months.  She has bilateral lower extremity wounds.  Her prognosis is poor.  Patient and family face treatment option decisions,  advanced directive decisions and anticipatory care needs.  Clinical Assessment and Goals of Care:  This NP Norma [name] reviewed medical records, received report from team, and then spoke to family, Daughter/Norma [name]/main support and decsion maker and patient's Sister/Norma [name]  to discuss diagnosis, prognosis, GOC, EOL wishes disposition and options.     Concept of Hospice and Palliative Care were discussed  A detailed discussion was had today regarding advanced directives.  Concepts specific to code status, artifical feeding and hydration, continued IV antibiotics and rehospitalization was had.  The difference between a aggressive medical intervention path  and a palliative comfort care path for this patient at this time was had.  Values and goals of care important to patient and family were attempted to be elicited.   [name] and I spoke again after she visited her mother at bedside and as a family decision has been  made to shift to full comfort, focusing on comfort and allowing for a nautual death.    Natural trajectory and expectations at EOL were discussed.  Questions and concerns addressed.   Family encouraged to call with questions or concerns.    PMT will continue to support holistically.     Main decision maker is her daughter with support from aunt and brother    [place]:  -  DNR/DNI   Palliative Prophylaxis:   Frequent pain assessment  Additional Recommendations (Limitations, Scope, Preferences):  Focus of care is comfort and dignity  No further dialysis  No further life prolonging measures; labs, diagnostics, antibiotics  Diet of choice  Symptom management  Residential hospice for EOL care  Psycho-social/Spiritual:   Desire for further Chaplaincy support:yes  Additional Recommendations:  Created space and opportunity to explore thoughts and feeling regarding current medical situation.  Emotional support offered  Prognosis:  Less than 2 weeks  Discharge Planning: to be determined     Primary Diagnoses: Present on Admission: . Hypoglycemia . ESRD (end stage renal disease) ([place]) . Anemia of chronic disease . Hypertension . Peripheral vascular disease, unspecified ([place]) . CKD (chronic kidney disease) stage 4, GFR 15-29 ml/min (HCC)   I  have reviewed the medical record, interviewed the patient and family, and examined the patient. The following aspects are pertinent.  Past Medical History:  Diagnosis Date  . Anxiety   . Carotid artery occlusion   . CKD (chronic kidney disease) stage 3, GFR 30-59 ml/min 04/18/2014  . Claudication ([place])   . Constipation    Due to patient taking iron supplements  . Coronary artery disease   . DDD (degenerative disc disease)   . Diabetes mellitus   . Diabetic coma ([place])   . DJD (degenerative joint disease)   . DJD (degenerative joint disease)   . Edema     Bilateral lower extermities  . Gout   . Hepatitis    Hep C, has been treated with "Maverick" - cured per pt  . Hyperlipidemia   . Hypertension   . Iron deficiency anemia   . Leg pain   . Obesity   . PAD (peripheral artery disease) ([place])   . Shortness of breath    exertion   Social History   Socioeconomic History  . Marital status: Single    Spouse name: Not on file  . Number of children: 2  . Years of education: Not on file  . Highest education level: Not on file  Occupational History  . 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 Financial resource strain: Not on file  . Food insecurity    Worry: Not on file    Inability: Not on file  . Transportation needs    Medical: Not on file    Non-medical: Not on file  Tobacco Use  . Smoking status: Former Smoker    Years: 20.00    Types: Cigarettes    Quit date: 07/10/1991    Years since quitting: 27.7  . Smokeless tobacco: Never Used  Substance and Sexual Activity  . Alcohol use: No  . Drug use: No  . Sexual activity: Never  Lifestyle  . Physical activity    Days per week: Not on file    Minutes per session: Not on file  . Stress: Not on file  Relationships  . Social Herbalist on phone: Not on file    Gets together: Not on file    Attends religious service: Not on file    Active member of club or organization: Not on file    Attends meetings of clubs or organizations: Not on file    Relationship status: Not on file  Other Topics Concern  . Not on file  Social History Narrative  . Not on file   Family History  Problem Relation Age of Onset  . Hypertension Mother   . Coronary artery disease Father   . Hypertension Sister   . Cirrhosis Brother   . Kidney disease Daughter   . Breast cancer Neg Hx    Scheduled Meds: . Chlorhexidine Gluconate Cloth  6 each Topical Daily  . darbepoetin (ARANESP) injection - DIALYSIS  200 mcg Intravenous Q Sat-HD  . feeding  supplement (NEPRO CARB STEADY)  237 mL Oral BID BM  . feeding supplement (PRO-STAT SUGAR FREE 64)  30 mL Oral BID  . heparin  5,000 Units Subcutaneous Q8H  . insulin aspart  1 Units Subcutaneous TID AC  . multivitamin  1 tablet Oral QHS   Continuous Infusions: . ceFEPime (MAXIPIME) IV Stopped (04/13/19 2056)  . sodium thiosulfate infusion for calciphylaxis    . vancomycin Stopped (  04/13/19 2351)   PRN Meds:.acetaminophen, diphenhydrAMINE-zinc acetate, HYDROcodone-acetaminophen, ondansetron (ZOFRAN) IV, traMADol Medications Prior to Admission:  Prior to Admission medications   Medication Sig Start Date End Date Taking? Authorizing Provider  acetaminophen (TYLENOL) 500 MG tablet Take 500 mg by mouth 2 (two) times daily.    Yes [provider]  apixaban (ELIQUIS) 5 MG TABS tablet Take 1 tablet (5 mg total) by mouth 2 (two) times daily. 04/02/19  Yes Lendon Colonel, NP  calcium acetate (PHOSLO) 667 MG capsule Take 1 capsule (667 mg total) by mouth 3 (three) times daily with meals. 03/08/19  Yes Deno Etienne, DO  carvedilol (COREG) 12.5 MG tablet Take 1 tablet (12.5 mg total) by mouth 2 (two) times daily. Patient taking differently: Take 12.5 mg by mouth See admin instructions. Take 1 tablet (12.5 mg) by mouth once daily in the evenings ONLY on Mondays, Wednesdays, & Fridays. Take 1 tablet (12.5 mg) by mouth twice daily on Sundays, Tuesdays, Thursdays, & Saturdays. 09/23/13  Yes Martinique, Peter M, MD  furosemide (LASIX) 80 MG tablet Take 160 mg by mouth See admin instructions. Take 2 tablets (160 mg) by mouth twice daily on Sundays,  Tuesdays, Thursdays, & Saturdays.   Yes [provider]  HYDROcodone-acetaminophen (NORCO) 5-325 MG tablet Take 1 tablet by mouth every 6 (six) hours as needed for severe pain. 02/19/19  Yes Sherwood Gambler, MD  insulin glargine (LANTUS) 100 UNIT/ML injection Inject 0.2 mLs (20 Units total) into the skin 2 (two) times daily. Patient taking differently:  Inject 12 Units into the skin daily.  04/25/16  Yes Meredith Pel, MD  Multiple Vitamin (MULTIVITAMIN WITH MINERALS) TABS tablet Take 1 tablet by mouth every evening.   Yes [provider]  ondansetron (ZOFRAN) 4 MG tablet Take 4 mg by mouth every 8 (eight) hours as needed for nausea or vomiting.   Yes [provider]  traMADol (ULTRAM) 50 MG tablet Take 50-100 mg by mouth every 8 (eight) hours as needed for pain. 03/09/19  Yes [provider]   Allergies  Allergen Reactions  . Carnosine Other (See Comments)  . Sulfasalazine     UNSPECIFIED REACTION   . Iohexol Itching, Rash and Other (See Comments)     Code: RASH, Desc: pt called 1 day post scanning stating that skin was red and "itching all over" some what better but still had symptom.. instructed pt to take benadryl to relieve symptoms,per dr Alvester Chou.if any problems call back/mms, Onset Date: [id number]   . Sulfa Antibiotics Other (See Comments)    UNSPECIFIED REACTION    Review of Systems  Physical Exam  Vital Signs: BP (!) 119/46 (BP Location: Right Wrist)   Pulse 97   Temp 97.6 F (36.4 C) (Oral)   Resp (!) 29   Ht 5\' 2"  (1.575 m)   Wt 45 kg   SpO2 98%   BMI 18.15 kg/m  Pain Scale: Faces POSS *See Group Information*: S-Acceptable,Sleep, easy to arouse Pain Score: Asleep   SpO2: SpO2: 98 % O2 Device:SpO2: 98 % O2 Flow Rate: .O2 Flow Rate (L/min): 1 L/min  IO: Intake/output summary:   Intake/Output Summary (Last 24 hours) at 04/15/2019 0904 Last data filed at 04/14/2019 2130 Gross per 24 hour  Intake 250 ml  Output -  Net 250 ml    LBM: Last BM Date: 04/10/19 Baseline Weight: Weight: 80.7 kg Most recent weight: Weight: 45 kg     Palliative Assessment/Data:    The above conversation was completed  via telephone due to the visitor restrictions during the COVID-19 pandemic. Thorough chart review and discussion with necessary members of the care team was completed as part of assessment.  All issues were discussed and addressed but no physical exam was performed.  Discussed with bedside RN and Annika Dirkse with Internal Medicine and LCSW/Cynthia  Time In: 1100 Time Out: 1220 Time Total: 80 minutes Greater than 50%  of this time was spent counseling and coordinating care related to the above assessment and plan.  Signed by: Norma Lessen, NP   Please contact Palliative Medicine Team phone at [phone] for questions and concerns.  For individual provider: See Shea Evans

## 2019-04-15 NOTE — Progress Notes (Signed)
PT Cancellation Note  Patient Details Name: Norma [name] MRN: [id number] DOB: 07/28/1945   Cancelled Treatment:    Reason Eval/Treat Not Completed: Patient declined, no reason specified;Fatigue/lethargy limiting ability to participate - Pt states she will not mobilize until daughter is bedside, and defers all bed-level exercise. Pt states "leave me alone".  Julien Girt, PT Acute Rehabilitation Services Pager [phone]  Office [phone]    [name] 04/15/2019, 10:32 AM

## 2019-04-15 NOTE — Progress Notes (Signed)
Manufacturing engineer [facility]) Hospital Liaison note.   Received request from [place], for family interest in [facility]. Chart reviewed and spoke with family to acknowledge referral.  Unfortunately [facility] is not able to offer a room today. Family and CSW are aware [facility] liaison will follow up with CSW and family tomorrow or sooner if room becomes available. Please do not hesitate to call with questions.   Thank you,   Farrel Gordon, RN, [facility]   [place]     [place] are on AMION

## 2019-04-15 NOTE — Progress Notes (Addendum)
Pharmacy Antibiotic Note  Norma [name] is a 73 y.o. female admitted on 04/09/2019 with hypoglycemia. Pharmacy has been consulted for vancomycin and cefepime dosing for sepsis. ESRD on MWF HD, Last session on 10/5, scheduled 10/7  Plan: Continue vanc 750 mg qHD, pre HD level due 10/9 Cefepime 2gm IV qHD MWF  HD MWF Confirm pt weight Monitor HD schedule/tolerance, weight, clinical progress, vanc level as indicated   Height: 5\' 2"  (157.5 cm) Weight: 99 lb 3.3 oz (45 kg) IBW/kg (Calculated) : 50.1  Temp (24hrs), Avg:97.6 F (36.4 C), Min:97.5 F (36.4 C), Max:97.7 F (36.5 C)  Recent Labs  Lab 04/09/19 1000  04/11/19 0625 04/12/19 0335 04/13/19 0412 04/14/19 0207 04/15/19 0130  WBC  --    < > 13.3* 15.2* 13.0* 13.8* 13.5*  CREATININE  --    < > 3.71* 2.55* 3.24* 2.50* 3.30*  LATICACIDVEN 2.2*  --   --   --   --   --   --    < > = values in this interval not displayed.    Estimated Creatinine Clearance: 10.8 mL/min (A) (by C-G formula based on SCr of 3.3 mg/dL (H)).    Allergies  Allergen Reactions  . Carnosine Other (See Comments)  . Sulfasalazine     UNSPECIFIED REACTION   . Iohexol Itching, Rash and Other (See Comments)     Code: RASH, Desc: pt called 1 day post scanning stating that skin was red and "itching all over" some what better but still had symptom.. instructed pt to take benadryl to relieve symptoms,per dr Alvester Chou.if any problems call back/mms, Onset Date: [id number]   . Sulfa Antibiotics Other (See Comments)    UNSPECIFIED REACTION     sepsis, has sacral decub ulcer with possible osteo Afebrile, WBC 13.0>13.8>13.5 Continue vanc 750 mg qHD, monitor weight  Vanc 10/1 >> Cefepime 10/1 >>  10/1 covid - negative 10/1 UCx - sent> not found 10/1 BCx - neg  Thank you for allowing pharmacy to be a part of this patient's care.  Gaylan Gerold 04/15/2019 9:34 AM

## 2019-04-15 NOTE — Progress Notes (Signed)
[facility] Progress Note   Assessment/ Plan:   OP Dialysis Orders:Adm farmon mwf. [id number] kgHD Bath 2k/ 2.25Time 4 hrHeparin NONE. AccessPerm cath ( LUA AVF Li gated 03/30/19)  Mircera 242mcg IV q 2wks/HD( last on 03/27/19) OtherNoted labs HGB 7.6 03/27/19 TFS 13% (03/25/19)  Problem/Plan: 1. ESRD -HD MWF scheduleOff schedule Saturday, back on sched 10/5, next planned 10/7.  I don't think dialysis is improving QOL and her wounds appear to be painful.  I support fully palliative care and hospice options.    2. AMS Multifactorial: hypoglycemia + delirium  3. 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 However, clinically appear to be calciphylaxis related.  Started sodium thiosulfate.  Wounds are getting worse---> and there seem to be more of them from when I saw her last before this hospitalization.    4. Hypertension/volume - bp On low side, Lasix d/c'd  5. Anemia - HGB 6.9 to 9.3>9.2>8.9  after transfusion PRBCS  On admit / contin ESA in Hospital ( due Aranesp Masx dose next HD ) OP Trans Fer. 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 Metabolic bone disease: phos of the low side, avoid all Ca-containing binders  7. Atrial  Fib=wasOn Eliquis(held by admit 2/2 bleeding ulcers)and Coreg, recommend avoiding all warfarin products  8. Nutrition -alb 1.5  Started  Protein supplementation.  9. Dispo: clarifying GOC--> as above  Subjective:    Palliative care consult in- appreciate assistance.  Pt is uncomfortable this AM and says "please come back".      Objective:   BP 128/72 (BP Location: Right Arm)   Pulse 96   Temp 97.6 F (36.4 C) (Oral)   Resp 18   Ht 5\' 2"  (1.575 m)   Wt 45 kg   SpO2 100%   BMI 18.15  kg/m   Physical Exam: Gen: older woman, curled up in bed, appears uncomfortable, moaning intermittently PULM: breathing appears to be labored by pain ACCESS: [facility]  Rest of exam deferred as pt uncomfortable  Labs: BMET Recent Labs  Lab 04/09/19 1008 04/10/19 0201 04/11/19 0625 04/12/19 0335 04/13/19 0412 04/14/19 0207 04/15/19 0130  NA 137 134* 133* 139 140 138 137  K 3.2* 3.8 4.3 3.1* 3.4* 5.0 4.7  CL 97* 95* 95* 96* 98 96* 100  CO2 29 26 25  21* 21* 21* 21*  GLUCOSE 148* 166* 132* 165* 168* 139* 138*  BUN 12 19 29* 19 37* 27* 38*  CREATININE 2.32* 2.80* 3.71* 2.55* 3.24* 2.50* 3.30*  CALCIUM 7.6* 7.8* 8.2* 8.2* 8.5* 8.3* 8.3*  PHOS  --  1.6*  --  2.1* 3.0 2.8  --    CBC Recent Labs  Lab 04/09/19 1008  04/12/19 0335 04/13/19 0412 04/14/19 0207 04/15/19 0130  WBC 15.7*   < > 15.2* 13.0* 13.8* 13.5*  NEUTROABS 14.4*  --   --   --   --   --   HGB 6.9*   < > 8.9* 9.1* 9.6* 9.2*  HCT 23.5*   < > 27.9* 27.7* 29.8* 28.1*  MCV 101.7*   < > 95.5 91.1 92.8 92.4  PLT 216   < > 216 221 174 183   < > = values in this interval not displayed.    @IMGRELPRIORS @ Medications:    . apixaban  5 mg Oral BID  . Chlorhexidine  Gluconate Cloth  6 each Topical Daily  . darbepoetin (ARANESP) injection - DIALYSIS  200 mcg Intravenous Q Sat-HD  . feeding supplement (NEPRO CARB STEADY)  237 mL Oral BID BM  . feeding supplement (PRO-STAT SUGAR FREE 64)  30 mL Oral BID  . insulin aspart  1 Units Subcutaneous TID AC  . multivitamin  1 tablet Oral QHS  . vancomycin  500 mg Intravenous Q M,W,F-HD     Madelon Lips, MD 04/15/2019, 12:24 PM

## 2019-04-16 ENCOUNTER — Ambulatory Visit: Payer: Medicare Other | Admitting: Vascular Surgery

## 2019-04-16 ENCOUNTER — Other Ambulatory Visit: Payer: Medicare Other

## 2019-04-16 ENCOUNTER — Ambulatory Visit: Payer: Medicare Other

## 2019-04-16 ENCOUNTER — Encounter (HOSPITAL_COMMUNITY): Payer: Medicare Other

## 2019-04-16 DIAGNOSIS — Z91041 Radiographic dye allergy status: Secondary | ICD-10-CM

## 2019-04-16 DIAGNOSIS — D649 Anemia, unspecified: Secondary | ICD-10-CM

## 2019-04-16 DIAGNOSIS — Z881 Allergy status to other antibiotic agents status: Secondary | ICD-10-CM

## 2019-04-16 DIAGNOSIS — Z888 Allergy status to other drugs, medicaments and biological substances status: Secondary | ICD-10-CM

## 2019-04-16 LAB — NOVEL CORONAVIRUS, NAA (HOSP ORDER, SEND-OUT TO REF LAB; TAT 18-24 HRS): SARS-CoV-2, NAA: NOT DETECTED

## 2019-04-16 MED ORDER — DIPHENHYDRAMINE-ZINC ACETATE 2-0.1 % EX CREA: TOPICAL_CREAM | Freq: Two times a day (BID) | CUTANEOUS | 0 refills | Status: AC | PRN

## 2019-04-16 NOTE — Social Work (Signed)
Clinical Social Worker facilitated patient discharge including contacting patient family and facility to confirm patient discharge plans.  Clinical information faxed to facility and family agreeable with plan.  CSW arranged ambulance transport via [facility] to [facility] RN to call [phone]  with report prior to discharge.  Clinical Social Worker will sign off for now as social work intervention is no longer needed. Please consult us again if new need arises.  Marlyne Totaro, MSW, LCSWA Clinical Social Worker [phone]  

## 2019-04-16 NOTE — Progress Notes (Signed)
Responded to palliative spiritual care consult. Pt was resting, but awake. No family present at this time. Miss Norma [name] said family was here earlier and should be coming later today. 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 Also, she said that she knows what's happening and she totally ok with the plan of care.   She was available to receive spiritual care with words of comfort, ministry of presence and prayer. She thank me for the Chaplain presence and fell asleep.   Palliative care Resident Chaplain Fidel Levy [phone]

## 2019-04-16 NOTE — Progress Notes (Addendum)
Report called to [facility]. on [place]. Patient to be transfer to 6 n 29 with belongings.Left message on Norma [name] phone that her mother was being transfer to [address] .  Also left message to call me for any questions. Patient was transferred via bed with all belongings to room [address]

## 2019-04-16 NOTE — Progress Notes (Signed)
[name] [phone] from [facility] place called that the pt has a bed offer and she will meet her daughter to sign the paper works around 4 pm, we can give report to tel [phone], charge nurse aware.

## 2019-04-16 NOTE — Progress Notes (Signed)
[facility] scheduled for transportation  Kingsley Spittle, [place]  [phone]

## 2019-04-16 NOTE — Progress Notes (Signed)
  Subjective: [name] was seen at bedside in the palliative care unit, where she was moved overnight. She did not endorse any complaints and after allowing [place] to provide a brief update on her disposition asked that we please leave her alone.   Objective: Vital signs in last 24 hours: Vitals:   04/15/19 1140 04/15/19 1208 04/16/19 0237 04/16/19 0348  BP:  128/72 112/68 (!) 88/71  Pulse: 78 96 (!) 104 87  Resp: _0 Temp:   97.6 F (36.4 C) 98 F (36.7 C)  TempSrc:   Oral Oral  SpO2: 96% 100% 97% 94%  Weight:      Height:       Weight change:   Intake/Output Summary (Last 24 hours) at 04/16/2019 1647 Last data filed at 04/16/2019 0008 Gross per 24 hour  Intake 0 ml  Output 0 ml  Net 0 ml   Physical Exam:  Patient refused physical exam.  Lab Results: No new results.   Micro Results: No new micro results.   Studies/Results: No new results.  Medications: I have reviewed the patient's current medications. Scheduled Meds: . Chlorhexidine Gluconate Cloth  6 each Topical Daily  . feeding supplement (NEPRO CARB STEADY)  237 mL Oral BID BM  . 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 Given poor prognosis and  after discussion with family, patient has decided to discontinue HD and pursue comfort care only. She will be discharged to inpatient hospice at [facility] today. Patient met with hospital chaplain today. - no further life-prolonging measures including labs, diagnostics, antibiotics. - wound care to change dressings q3days or prn - diet of choice - symptom management with frequent pain assessment - discharge to inpatient hospice for EOL care - spiritual care  This is a Medical Student Note.  The care of the patient was discussed with Dr.Butcher and the assessment and plan formulated with their assistance.  Please see their attached note for official documentation of the daily encounter.   LOS: 7 days   Lennie Hummer, Medical Student 04/16/2019, 4:47 PM

## 2019-04-16 NOTE — Care Management Important Message (Signed)
Important Message  Patient Details  Name: CHABELY SENSABAUGH MRN: [id number] Date of Birth: April 21, 1946   Medicare Important Message Given:  Yes     [name] 04/16/2019, 4:35 PM

## 2019-04-16 NOTE — Progress Notes (Signed)
Pt for transfer to [facility] place report given to Kerrin Champagne, discontinued peripheral IV line, still with right IJ  Dressing dry and intact, discontinued peripheral IV line, no s/s of distress at this time.

## 2019-04-16 NOTE — TOC Transition Note (Signed)
Transition of Care [facility]) - CM/SW Discharge Note   Patient Details  Name: Norma [name] MRN: [id number] Date of Birth: [birth year]  [facility]) CM/SW Contact:  Alexander Mt, [place] Phone Number: 04/16/2019, 10:30 AM   Clinical Narrative:    CSW aware pt has a bed at [facility] today. Paperwork will be completed at 330pm by pt daughter. CSW will complete PTAR papers. When pt stable for dc then CSW will call for transport.    Final next level of care: Hospice Medical Facility Barriers to Discharge: Other (comment)(paperwork completion with [place])   Patient Goals and CMS Choice Patient states their goals for this hospitalization and ongoing recovery are:: comfort care CMS Medicare.gov Compare Post Acute Care list provided to:: Patient Represenative (must comment)(daughter) Choice offered to / list presented to : Adult Children  Discharge Placement Patient chooses bed at: Other - please specify in the comment section below:([facility]) Patient to be transferred to facility by: [place] Name of family member notified: pt daughter [name] Patient and family notified of of transfer: 04/16/19  Discharge Plan and Services In-house Referral: Clinical Social Work Discharge Planning Services: CM Consult  Social Determinants of Health (SDOH) Interventions     Readmission Risk Interventions No flowsheet data found.

## 2019-04-16 NOTE — Discharge Summary (Signed)
Name: Norma [name] MRN: [id number] DOB: Jun 10, 1946 73 y.o. PCP: Bartholome Bill, MD  Date of Admission: 04/09/2019  9:46 AM Date of Discharge: 04/16/2019 Attending Physician: Bartholomew Crews, MD  Discharge Diagnosis: 1. ESRD with calciphylaxis 2. 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 Hypoglycemia 5. Hypotension 6. Persistent atrial fibrillation 7. Anemia   Discharge Medications: Allergies as of 04/16/2019      Reactions   Carnosine Other (See Comments)   Sulfasalazine    UNSPECIFIED REACTION    Iohexol Itching, Rash, Other (See Comments)    Code: RASH, Desc: pt called 1 day post scanning stating that skin was red and "itching all over" some what better but still had symptom.. instructed pt to take benadryl to relieve symptoms,per dr Alvester Chou.if any problems call back/mms, Onset Date: [id number]   Sulfa Antibiotics Other (See Comments)   UNSPECIFIED REACTION       Medication List    STOP taking these medications   apixaban 5 MG Tabs tablet Commonly known as: Eliquis   calcium acetate 667 MG capsule Commonly known as: PhosLo   carvedilol 12.5 MG tablet Commonly known as: COREG   furosemide 80 MG tablet Commonly known as: LASIX   insulin glargine 100 UNIT/ML injection Commonly known as: LANTUS   multivitamin with minerals Tabs tablet     TAKE these medications   acetaminophen 500 MG tablet Commonly known as: TYLENOL Take 500 mg by mouth 2 (two) times daily.   diphenhydrAMINE-zinc acetate cream Commonly known as: BENADRYL Apply topically 2 (two) times daily as needed for itching.   HYDROcodone-acetaminophen 5-325 MG tablet Commonly known as: Norco Take 1 tablet by mouth every 6 (six) hours as needed for severe pain.   traMADol 50 MG tablet Commonly known as: ULTRAM Take 50-100 mg by mouth every 8 (eight) hours as needed for pain.   Zofran 4 MG tablet Generic drug: ondansetron  Take 4 mg by mouth every 8 (eight) hours as needed for nausea or vomiting.       Disposition and follow-up:   Norma [name] was discharged from [facility] to hospice in terminal condition:  1.  No further follow-up appointments needed; patient has been transitioned to inpatient hospice.   2.  Labs / imaging needed at time of follow-up: none  3.  Pending labs/ test needing follow-up: none  Follow-up Appointments: none  Hospital Course by problem list:  1. 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 Nephrology consulted and believed some of her many wounds to be due to calciphylaxis. Given this diagnosis as well as her frail state and many wounds, nephrology estimated her prognosis to be between 3-6 months with aggressive treatment, and a matter of weeks with discontinuation of HD and pursuit of comfort care. After discussion with the patient and her daughter as well as consult with palliative care, the decision was made to discontinue HD and pursue full comfort care. Patient will be discharged to [facility] inpatient hospice center.   2. 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 These dressings changes were quite uncomfortable for the patient and she became increasingly likely to refuse. Given the transition to comfort care described above the new plan  will be to switch to dressing changes every days or as needed for comfort.  3. 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  4. Hypoglycemia Patient presented  with recurrent hypoglycemia overnight despite ingestion of glucose and no new insulin. This was likely due to decreased insulin requirement in the setting of ESRD. Sensitive SSI before meals was initiated after blood sugars normalized and has been discontinued with the decision to pursue comfort care only.   5. Hypotension Patient with repeated low blood pressures generally after HD. Given concern for sepsis, patient was treated with Vancomycin and Cefepime after MWF dialysis 10/2-10/7. Given the correlation with HD and lack of identified source of infection, hypotension likely secondary to dialysis.   6. Persistent atrial fibrillation Patient with irregular rhythm and normal rate throughout hospital stay despite discontinuation of home medications. Given transition to comfort care will continue not to medicate.  7. Anemia  Secondary to ESRD. Patient was receiving aranesp during dialysis but with the transition to comfort care HD will be stopped and no further interventions or monitoring will be pursued for this problem.   Discharge Vitals:   BP (!) 88/71 (BP Location: Right Wrist) Comment: RN notified  Pulse 87   Temp 98 F (36.7 C) (Oral)   Resp 18   Ht 5\' 2"  (1.575 m)   Wt 45 kg   SpO2 94%   BMI 18.15 kg/m   Pertinent Labs, Studies, and Procedures:  CMP Latest Ref Rng & Units 04/15/2019 04/14/2019 04/13/2019  Glucose 70 - 99 mg/dL 138(H) 139(H) 168(H)  BUN 8 - 23 mg/dL 38(H) 27(H) 37(H)  Creatinine 0.44 - 1.00 mg/dL 3.30(H) 2.50(H) 3.24(H)  Sodium 135 - 145 mmol/L 137 138 140  Potassium 3.5 - 5.1 mmol/L 4.7 5.0 3.4(L)  Chloride 98 - 111 mmol/L 100 96(L) 98  CO2 22 - 32 mmol/L 21(L) 21(L) 21(L)  Calcium 8.9 - 10.3 mg/dL 8.3(L) 8.3(L) 8.5(L)  Total Protein 6.5 - 8.1 g/dL - - -  Total Bilirubin 0.3 - 1.2 mg/dL - - -  Alkaline Phos 38 - 126 U/L - - -  AST 15 - 41 U/L - - -  ALT 0 - 44 U/L - - -   CBC Latest Ref Rng & Units 04/15/2019 04/14/2019 04/13/2019  WBC 4.0 - 10.5 K/uL 13.5(H)  13.8(H) 13.0(H)  Hemoglobin 12.0 - 15.0 g/dL 9.2(L) 9.6(L) 9.1(L)  Hematocrit 36.0 - 46.0 % 28.1(L) 29.8(L) 27.7(L)  Platelets 150 - 400 K/uL 183 174 221    Discharge Instructions: Discharge Instructions    Activity as tolerated - No restrictions   Complete by: As directed    Diet general   Complete by: As directed    Discharge instructions   Complete by: As directed    Thank you for allowing [place] to take part in your care.    - no further life-prolonging measures including labs, diagnostics, antibiotics. - wound care to change dressings q3days or prn - diet of choice - symptom management with frequent pain assessment - discharge to inpatient hospice for EOL care  Signed: Lennie Hummer, Medical Student 04/16/2019, 12:28 PM   Pager: [phone]  Attested:  Dr. Jose Persia Internal Medicine PGY-1  Pager: [phone] 04/16/2019, 3:33 PM

## 2019-04-16 NOTE — Plan of Care (Signed)
Pt for discharge today going to [facility], alert and responsive, her daughter aware of the discharge, will give report at 4 pm.

## 2019-04-16 NOTE — Progress Notes (Signed)
Nutrition Brief Note  Chart reviewed. Pt now transitioning to comfort care.  No further nutrition interventions warranted at this time.  Please re-consult as needed.   Andi Layfield A. Junita Kubota, RD, LDN, CDCES Registered Dietitian II Certified Diabetes Care and Education Specialist Pager: [phone] After hours Pager: [phone]  

## 2019-04-21 ENCOUNTER — Ambulatory Visit (HOSPITAL_BASED_OUTPATIENT_CLINIC_OR_DEPARTMENT_OTHER): Payer: Medicare Other | Admitting: Internal Medicine

## 2019-04-28 ENCOUNTER — Ambulatory Visit: Payer: Medicare Other | Admitting: Physical Medicine and Rehabilitation

## 2019-05-07 ENCOUNTER — Other Ambulatory Visit: Payer: Medicare Other

## 2019-05-07 ENCOUNTER — Ambulatory Visit: Payer: Medicare Other

## 2019-05-10 DEATH — deceased

## 2019-05-28 ENCOUNTER — Other Ambulatory Visit: Payer: Medicare Other

## 2019-05-28 ENCOUNTER — Ambulatory Visit: Payer: Medicare Other

## 2019-05-28 ENCOUNTER — Ambulatory Visit: Payer: Medicare Other | Admitting: Internal Medicine

## 2019-06-16 NOTE — Progress Notes (Signed)
Bernardette, [name] ([id number]) Visit Report for 03/31/2019 HPI Details Patient Name: Date of Service: Norma [name], Norma [name] 03/31/2019 12:30 PM Medical Record [id number] Patient Account Number: Date of Birth/Sex: Jul 20, 1945 (73 y.o. Female) Treating RN: Carlene Coria Primary Care Provider: Precious Haws Other Clinician: Referring Provider: Treating Provider/Extender:Coal Nearhood, Esperanza Richters in Treatment: 2 History of Present Illness HPI Description: ADMISSION 03/13/2019 This is a 73 year old woman who is a type II diabetic and recently started on hemodialysis this week. She is accompanied by her daughter. They think that she has had wounds possibly for as long as 4 to 5 months but the daughter only became aware of the issues within the last several weeks. This was noted by her nephrologist and she was referred here and also to general surgery for biopsy. She was placed on doxycycline out of concern for possible emergent infection. The patient actually has several wound areas of concern; 1. Superficial small areas on the right anterior mid tibia which looks like chronic venous insufficiency/fluid overload type of injury. These do not look particularly ominous although the wound area is large 2. Large necrotic wound on the right lateral hip I guess. There is a small satellite lesion underneath this. Necrotic circumference around the wound and clearly exposed muscle. 3. Large necrotic mirror image on the left lateral hip. Again exposed muscle not as much necrotic debris. 4. Multiple areas of firm subcutaneous nodules. this includes on her abdomen massively on the right anterior thigh greater than left anterior thigh. These are not particularly painful and do not feel like lipomas. Nevertheless there is no history here. She says she showed these to her primary doctor roughly 3 to 4 weeks ago. The patient was in the ER on 03/08/2019 she was bleeding from the left hip wound. Apparently by the time she got  to the ER this it stopped. The only thing notable here was an albumin level of 2.6. I do not see any x-rays or cultures. She did have an x-ray of the right tib-fib earlier in August and this was negative. Past medical history; hypertension, PAD, type 2 diabetes, chronic renal failure stage V with recent initiation of dialysis anemia of chronic disease,o Calciphylaxis ABIs in our clinic were noncompressible on the right. We did not test the left side 9/10-Patient returns to clinic 1 week after being established for bilateral trochanteric and bilateral lower extremity ulcers in the lower leg right posterior and right anterior. Patient was started on silver alginate dressings. Biopsies were performed last time on the anterior thigh indurated areas and also from the right hip wound. The biopsy results indicate fat necrosis suggestive of trauma/pressure injury. 9/22; I admitted this patient to clinic 2-1/2 weeks ago. She is an unfortunate patient with deep necrotic areas over the lateral hips bilaterally. She also has subcutaneous masses on her anterior thigh and mostly in her right lower part of her abdomen. Finally she has wounds on the right anterior and right posterior calf. 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 They reversed her fistula and she now has a sub-clavian catheter in for dialysis. She started dialysis 6 weeks ago. Unfortunately the multiple biopsies I did on 9 4 were not really helpful other than to say that they did not see definitive features of calciphylaxis nor a primary vasculitic pro process. There was a dense mixed inflammatory cell infiltrate. The punch biopsies I did of the  mass on her anterior thighs showed focal fat necrosis making me wonder about panniculitis. AFB and Fite stains were negative for mycobacteria and a PAS stain was negative for fungus. Other than that  I am still not certain of the diagnosis. Electronic Signature(s) Signed: 03/31/2019 5:41:07 PM By: Linton Ham MD Entered By: Linton Ham on 03/31/2019 14:40:50 -------------------------------------------------------------------------------- Physical Exam Details Patient Name: Date of Service: Norma [name] 03/31/2019 12:30 PM Medical Record [id number] Patient Account Number: Date of Birth/Sex: [birth year] (73 y.o. Female) Treating RN: Carlene Coria Primary Care Provider: Precious Haws Other Clinician: Referring Provider: Treating Provider/Extender:Hong Timm, Esperanza Richters in Treatment: 2 Constitutional Sitting or standing Blood Pressure is within target range for patient.. Pulse regular and within target range for patient.Marland Kitchen Respirations regular, non-labored and within target range.. Temperature is normal and within the target range for the patient.Marland Kitchen Appears in no distress. Gastrointestinal (GI) Large abdominal subcutaneous masses specifically in the right lower quadrant. No liver or spleen enlargement. Musculoskeletal She has an ischemic left fourth finger. Notes Wound exam; the patient has mirror-image wounds on the lateral part of both hips these are deep necrotic wounds. She has an area on the left thigh anteriorly in the right thigh anteriorly. Finally she has substantial areas on the right anterior and right posterior calf the latter of which is necrotic and covered in a thick eschar. The biopsy sites I did on the anterior thighs almost have purulent looking drainage especially on the right I cultured this I wonder if this is fat necrosis rather than infection Electronic Signature(s) Signed: 03/31/2019 5:41:07 PM By: Linton Ham MD Entered By: Linton Ham on 03/31/2019 14:43:03 -------------------------------------------------------------------------------- Physician Orders Details Patient Name: Date of Service: Norma [name]. 03/31/2019 12:30 PM Medical  Record [id number] Patient Account Number: Date of Birth/Sex: April 25, 1946 (73 y.o. Female) Treating RN: Carlene Coria Primary Care Provider: Precious Haws Other Clinician: Referring Provider: Treating Provider/Extender:Zhane Donlan, Esperanza Richters in Treatment: 2 Verbal / Phone Orders: No Diagnosis Coding ICD-10 Coding Code Description L89.214 Pressure ulcer of right hip, stage 4 L89.224 Pressure ulcer of left hip, stage 4 L97.811 Non-pressure chronic ulcer of other part of right lower leg limited to breakdown of skin I87.333 Chronic venous hypertension (idiopathic) with ulcer and inflammation of bilateral lower extremity E83.59 Other disorders of calcium metabolism E11.22 Type 2 diabetes mellitus with diabetic chronic kidney disease Follow-up Appointments Return Appointment in 1 week. Dressing Change Frequency Change dressing three times week. - all wounds and as needed for excessive drainage especially to bilateral trochanter and upper leg wounds. Wound Cleansing Clean wound with Wound Cleanser - or normal saline to all wounds Primary Wound Dressing Wound #1 Right,Anterior Lower Leg Calcium Alginate with Silver Wound #10 Left,Posterior Upper Leg Calcium Alginate with Silver Wound #2 Right,Posterior Lower Leg Calcium Alginate with Silver Wound #3 Right Trochanter Calcium Alginate with Silver Wound #4 Left Trochanter Calcium Alginate with Silver Wound #5 Right,Anterior Upper Leg Calcium Alginate with Silver Wound #6 Right,Medial Upper Leg Calcium Alginate with Silver Wound #7 Left,Anterior Upper Leg Calcium Alginate with Silver Wound #8 Right,Distal,Posterior Lower Leg Calcium Alginate with Silver Wound #9 Sacrum Calcium Alginate with Silver Secondary Dressing Wound #1 Right,Anterior Lower Leg Dry Gauze ABD pad - secure with tape Wound #10 Left,Posterior Upper Leg Dry Gauze ABD pad - secure with tape Wound #2 Right,Posterior Lower Leg Dry Gauze ABD pad - secure with  tape Wound #3 Right Trochanter Dry Gauze ABD pad - secure with tape Wound #4 Left Trochanter Dry Gauze ABD pad -  secure with tape Wound #5 Right,Anterior Upper Leg Dry Gauze ABD pad - secure with tape Wound #6 Right,Medial Upper Leg Dry Gauze ABD pad - secure with tape Wound #7 Left,Anterior Upper Leg Dry Gauze ABD pad - secure with tape Wound #8 Right,Distal,Posterior Lower Leg Dry Gauze ABD pad - secure with tape Wound #9 Sacrum Dry Gauze ABD pad - secure with tape Edema Control Kerlix and Coban - Right Lower Extremity Elevate legs to the level of the heart or above for 30 minutes daily and/or when sitting, a frequency of: - throughout the day Off-Loading Air fluidized (Group 3) mattress - pending Turn and reposition every 2 hours [facility] for wound care. - Interim- all wounds and as needed for excessive drainage especially to bilateral trochanter and upper leg wounds. Consults Vascular - ref. for Arterial studies/ ABI/ TBI - (ICD10 E11.22 - Type 2 diabetes mellitus with diabetic chronic kidney disease) Laboratory Bacteria identified in Unspecified specimen by Anaerobe culture (MICRO) - right thigh non healing wound - (ICD10 [id number] - Pressure ulcer of left hip, stage 4) LOINC Code: [id number] Convenience Name: Anerobic culture Electronic Signature(s) Signed: 03/31/2019 5:41:07 PM By: Linton Ham MD Signed: 06/16/2019 3:04:22 PM By: Carlene Coria RN Entered By: Carlene Coria on 03/31/2019 14:24:55 -------------------------------------------------------------------------------- Prescription 03/31/2019 Patient Name: Carrolyn Leigh K. Provider: Linton Ham MD Date of Birth: 1946/01/24 NPI#: [id number] Sex: Female DEA#: [id number] Phone #: [phone] License #: [id number] Patient Address: [place] [address] [place], [place] [address] [place], [place]  [id number] [phone] Allergies Sulfa (Sulfonamide Antibiotics) Iodinated Contrast Media Provider's Orders Vascular - ICD10: E11.22 - ref. for Arterial studies/ ABI/ TBI Signature(s): Date(s): Electronic Signature(s) Signed: 03/31/2019 5:41:07 PM By: Linton Ham MD Signed: 06/16/2019 3:04:22 PM By: Carlene Coria RN Entered By: Carlene Coria on 03/31/2019 14:24:56 --------------------------------------------------------------------------------  Problem List Details Patient Name: Date of Service: Norma [name]. 03/31/2019 12:30 PM Medical Record [id number] Patient Account Number: Date of Birth/Sex: 1945/08/22 (73 y.o. Female) Treating RN: Carlene Coria Primary Care Provider: Precious Haws Other Clinician: Referring Provider: Treating Provider/Extender:Lemuel Boodram, Esperanza Richters in Treatment: 2 Active Problems ICD-10 Evaluated Encounter Code Description Active Date Today Diagnosis L89.214 Pressure ulcer of right hip, stage 4 03/13/2019 No Yes L89.224 Pressure ulcer of left hip, stage 4 03/13/2019 No Yes L97.811 Non-pressure chronic ulcer of other part of right lower 03/13/2019 No Yes leg limited to breakdown of skin I87.333 Chronic venous hypertension (idiopathic) with ulcer 03/13/2019 No Yes and inflammation of bilateral lower extremity E11.22 Type 2 diabetes mellitus with diabetic chronic kidney 03/13/2019 No Yes disease Inactive Problems ICD-10 Code Description Active Date Inactive Date E83.59 Other disorders of calcium metabolism 03/13/2019 03/13/2019 Resolved Problems Electronic Signature(s) Signed: 03/31/2019 5:41:07 PM By: Linton Ham MD Entered By: Linton Ham on 03/31/2019 14:37:49 -------------------------------------------------------------------------------- Progress Note Details Patient Name: Date of Service: Norma [name]. 03/31/2019 12:30 PM Medical Record [id number] Patient Account Number: Date of Birth/Sex: March 24, 1946 (73 y.o. Female) Treating RN: Carlene Coria Primary Care Provider: Precious Haws Other Clinician: Referring Provider: Treating Provider/Extender:Undra Harriman, Esperanza Richters in Treatment: 2 Subjective History of Present Illness (HPI) ADMISSION 03/13/2019 This is a 73 year old woman who is a type II diabetic and recently started on hemodialysis this week. She is accompanied by her daughter. They think that she has had wounds possibly for as long as 4 to 5 months but the daughter only became aware of the issues within the last  several weeks. This was noted by her nephrologist and she was referred here and also to general surgery for biopsy. She was placed on doxycycline out of concern for possible emergent infection. The patient actually has several wound areas of concern; 1. Superficial small areas on the right anterior mid tibia which looks like chronic venous insufficiency/fluid overload type of injury. These do not look particularly ominous although the wound area is large 2. Large necrotic wound on the right lateral hip I guess. There is a small satellite lesion underneath this. Necrotic circumference around the wound and clearly exposed muscle. 3. Large necrotic mirror image on the left lateral hip. Again exposed muscle not as much necrotic debris. 4. Multiple areas of firm subcutaneous nodules. this includes on her abdomen massively on the right anterior thigh greater than left anterior thigh. These are not particularly painful and do not feel like lipomas. Nevertheless there is no history here. She says she showed these to her primary doctor roughly 3 to 4 weeks ago. The patient was in the ER on 03/08/2019 she was bleeding from the left hip wound. Apparently by the time she got to the ER this it stopped. The only thing notable here was an albumin level of 2.6. I do not see any x-rays or cultures. She did have an x-ray of the right tib-fib earlier in August and this was negative. Past medical history; hypertension, PAD, type 2  diabetes, chronic renal failure stage V with recent initiation of dialysis anemia of chronic disease,o Calciphylaxis ABIs in our clinic were noncompressible on the right. We did not test the left side 9/10-Patient returns to clinic 1 week after being established for bilateral trochanteric and bilateral lower extremity ulcers in the lower leg right posterior and right anterior. Patient was started on silver alginate dressings. Biopsies were performed last time on the anterior thigh indurated areas and also from the right hip wound. The biopsy results indicate fat necrosis suggestive of trauma/pressure injury. 9/22; I admitted this patient to clinic 2-1/2 weeks ago. She is an unfortunate patient with deep necrotic areas over the lateral hips bilaterally. She also has subcutaneous masses on her anterior thigh and mostly in her right lower part of her abdomen. Finally she has wounds on the right anterior and right posterior calf. 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 They reversed her fistula and she now has a sub-clavian catheter in for dialysis. She started dialysis 6 weeks ago. Unfortunately the multiple biopsies I did on 9 4 were not really helpful other than to say that they did not see definitive features of calciphylaxis nor a primary vasculitic pro process. There was a dense mixed inflammatory cell infiltrate. The punch biopsies I did of the mass on her anterior thighs showed focal fat necrosis making me wonder about panniculitis. AFB and Fite stains were negative for mycobacteria and a PAS stain was negative for fungus. Other than that I am still not certain of the diagnosis. Objective Constitutional Sitting or standing Blood Pressure is within target range for patient.. Pulse regular and within target range for patient.Marland Kitchen Respirations regular, non-labored and within target range..  Temperature is normal and within the target range for the patient.Marland Kitchen Appears in no distress. Vitals Time Taken: 1:35 PM, Height: 62 in, Weight: 162 lbs, BMI: 29.6, Temperature: 97.7 F, Pulse: 77 bpm, Respiratory Rate: 20 breaths/min, Blood Pressure: 109/49 mmHg. Gastrointestinal (GI)  Large abdominal subcutaneous masses specifically in the right lower quadrant. No liver or spleen enlargement. Musculoskeletal She has an ischemic left fourth finger. General Notes: Wound exam; the patient has mirror-image wounds on the lateral part of both hips these are deep necrotic wounds. ooShe has an area on the left thigh anteriorly in the right thigh anteriorly. ooFinally she has substantial areas on the right anterior and right posterior calf the latter of which is necrotic and covered in a thick eschar. ooThe biopsy sites I did on the anterior thighs almost have purulent looking drainage especially on the right I cultured this I wonder if this is fat necrosis rather than infection Integumentary (Hair, Skin) Wound #1 status is Open. Original cause of wound was Gradually Appeared. The wound is located on the Right,Anterior Lower Leg. The wound measures 10.5cm length x 4.5cm width x 0.1cm depth; 37.11cm^2 area and 3.711cm^3 volume. There is Fat Layer (Subcutaneous Tissue) Exposed exposed. There is no tunneling or undermining noted. There is a large amount of serous drainage noted. The wound margin is distinct with the outline attached to the wound base. There is no granulation within the wound bed. There is a large (67-100%) amount of necrotic tissue within the wound bed including Adherent Slough. Wound #10 status is Open. Original cause of wound was Shear/Friction. The wound is located on the Left,Posterior Upper Leg. The wound measures 2.8cm length x 2cm width x 0.1cm depth; 4.398cm^2 area and 0.44cm^3 volume. There is no tunneling or undermining noted. There is a medium amount of serosanguineous  drainage noted. The wound margin is distinct with the outline attached to the wound base. There is medium (34-66%) red, pink granulation within the wound bed. There is a medium (34-66%) amount of necrotic tissue within the wound bed including Adherent Slough. Wound #2 status is Open. Original cause of wound was Blister. The wound is located on the Right,Posterior Lower Leg. The wound measures 5.5cm length x 4.5cm width x 0.1cm depth; 19.439cm^2 area and 1.944cm^3 volume. There is Fat Layer (Subcutaneous Tissue) Exposed exposed. There is no tunneling or undermining noted. There is a none present amount of drainage noted. The wound margin is distinct with the outline attached to the wound base. There is no granulation within the wound bed. There is a large (67-100%) amount of necrotic tissue within the wound bed including Eschar. Wound #3 status is Open. Original cause of wound was Gradually Appeared. The wound is located on the Right Trochanter. The wound measures 11cm length x 5cm width x 2.5cm depth; 43.197cm^2 area and 107.992cm^3 volume. There is muscle and Fat Layer (Subcutaneous Tissue) Exposed exposed. There is no tunneling noted, however, there is undermining starting at 12:00 and ending at 2:00 with a maximum distance of 3cm. There is additional undermining at 6:00 and ending at 8:00 with a maximum distance of 2.1cm, and at 3:00 and ending at 5:00 with a maximum distance of 1.8cm. There is a large amount of serosanguineous drainage noted. Foul odor after cleansing was noted. The wound margin is well defined and not attached to the wound base. There is medium (34-66%) red, pink granulation within the wound bed. There is a medium (34-66%) amount of necrotic tissue within the wound bed including Eschar and Adherent Slough. Wound #4 status is Open. Original cause of wound was Gradually Appeared. The wound is located on the Left Trochanter. The wound measures 12cm length x 4.5cm width x 2cm  depth; 42.412cm^2 area and 84.823cm^3 volume. There is muscle and Fat  Layer (Subcutaneous Tissue) Exposed exposed. There is no tunneling noted, however, there is undermining starting at 7:00 and ending at 10:00 with a maximum distance of 1.8cm. There is a large amount of serosanguineous drainage noted. Foul odor after cleansing was noted. The wound margin is distinct with the outline attached to the wound base. There is medium (34-66%) red, pink granulation within the wound bed. There is a medium (34-66%) amount of necrotic tissue within the wound bed including Adherent Slough. Wound #5 status is Open. Original cause of wound was Gradually Appeared. The wound is located on the Right,Anterior Upper Leg. The wound measures 1.2cm length x 1.2cm width x 1cm depth; 1.131cm^2 area and 1.131cm^3 volume. There is Fat Layer (Subcutaneous Tissue) Exposed exposed. There is no undermining noted, however, there is tunneling at 1:00 with a maximum distance of 3.7cm. There is a medium amount of serosanguineous drainage noted. The wound margin is distinct with the outline attached to the wound base. There is no granulation within the wound bed. There is a large (67-100%) amount of necrotic tissue within the wound bed including Eschar and Adherent Slough. Wound #6 status is Open. Original cause of wound was Gradually Appeared. The wound is located on the Right,Medial Upper Leg. The wound measures 4.1cm length x 3cm width x 0.1cm depth; 9.66cm^2 area and 0.966cm^3 volume. There is no tunneling or undermining noted. There is a medium amount of serosanguineous drainage noted. The wound margin is distinct with the outline attached to the wound base. There is small (1-33%) pink granulation within the wound bed. There is a large (67-100%) amount of necrotic tissue within the wound bed including Eschar and Adherent Slough. Wound #7 status is Open. Original cause of wound was Gradually Appeared. The wound is located on  the Left,Anterior Upper Leg. The wound measures 0.6cm length x 0.8cm width x 1.4cm depth; 0.377cm^2 area and 0.528cm^3 volume. There is Fat Layer (Subcutaneous Tissue) Exposed exposed. There is no tunneling noted, however, there is undermining starting at 10:00 and ending at 1:00 with a maximum distance of 1.3cm. There is a medium amount of purulent drainage noted. The wound margin is distinct with the outline attached to the wound base. There is medium (34-66%) pink granulation within the wound bed. There is a medium (34-66%) amount of necrotic tissue within the wound bed including Adherent Slough. Wound #8 status is Open. Original cause of wound was Pressure Injury. The wound is located on the Right,Distal,Posterior Lower Leg. The wound measures 0.5cm length x 0.6cm width x 0.1cm depth; 0.236cm^2 area and 0.024cm^3 volume. There is no tunneling or undermining noted. There is a none present amount of drainage noted. The wound margin is distinct with the outline attached to the wound base. There is no granulation within the wound bed. There is a large (67-100%) amount of necrotic tissue within the wound bed including Eschar. Wound #9 status is Open. Original cause of wound was Shear/Friction. The wound is located on the Sacrum. The wound measures 8cm length x 5.8cm width x 0.1cm depth; 36.442cm^2 area and 3.644cm^3 volume. There is no tunneling or undermining noted. There is a medium amount of serosanguineous drainage noted. The wound margin is distinct with the outline attached to the wound base. There is no granulation within the wound bed. There is a large (67-100%) amount of necrotic tissue within the wound bed including Adherent Slough. 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 Dressing Change Frequency: Change dressing three times week. - all wounds and as needed for excessive drainage especially to bilateral trochanter and upper leg wounds. Wound Cleansing: Clean wound with Wound Cleanser - or normal saline to all wounds Primary Wound Dressing: Wound #1 Right,Anterior Lower Leg: Calcium Alginate with Silver Wound #10 Left,Posterior Upper Leg: Calcium Alginate with Silver Wound #2 Right,Posterior Lower Leg: Calcium Alginate with Silver Wound #3 Right Trochanter: Calcium Alginate with Silver Wound #4 Left Trochanter: Calcium Alginate with Silver Wound #5 Right,Anterior Upper Leg: Calcium Alginate with Silver Wound #6 Right,Medial Upper Leg: Calcium Alginate with Silver Wound #7 Left,Anterior Upper Leg: Calcium Alginate with Silver Wound #8 Right,Distal,Posterior Lower Leg: Calcium Alginate with Silver Wound #9 Sacrum: Calcium Alginate with Silver Secondary Dressing: Wound #1 Right,Anterior Lower Leg: Dry Gauze ABD pad - secure with tape Wound #10 Left,Posterior Upper Leg: Dry Gauze ABD pad - secure with tape Wound #2 Right,Posterior Lower Leg: Dry Gauze ABD pad - secure with tape Wound #3 Right Trochanter: Dry Gauze ABD pad - secure with tape Wound #4 Left Trochanter: Dry Gauze ABD pad - secure with tape Wound #5 Right,Anterior Upper Leg: Dry Gauze ABD pad - secure with tape Wound #6 Right,Medial Upper Leg: Dry Gauze ABD pad - secure with tape Wound #7 Left,Anterior Upper Leg: Dry Gauze ABD pad - secure with tape Wound #8 Right,Distal,Posterior Lower Leg: Dry Gauze ABD pad - secure with tape Wound #9 Sacrum: Dry Gauze ABD pad - secure with tape Edema Control: Kerlix and Coban - Right Lower Extremity Elevate legs to the level of the heart or above for 30 minutes daily  and/or when sitting, a frequency of: - throughout the day Off-Loading: Air fluidized (Group 3) mattress - pending Turn and reposition every 2 hours Home Health: [facility] for wound care. - Interim- all wounds and as needed for excessive drainage especially to bilateral trochanter and upper leg wounds. Laboratory ordered were: Anerobic culture - right thigh non healing wound Consults ordered were: Vascular - ref. for Arterial studies/ ABI/ TBI 1. I am going to use silver alginate to all wound areas 2. She needs arterial studies of the right leg 3. I need to do some research on panniculitis 4. By my biopsies last week the patient does not have definitive features of calciphylaxis nor vasculitis. I think the areas on her lateral hips are probably pressure ulcers. 5. Question ischemic wounds on the right leg. She will need arterial studies. 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 extremity E11.22 Type 2 diabetes mellitus with  diabetic chronic  kidney disease Facility Procedures CPT4 Code: [id number] Description: [phone] - WOUND CARE VISIT-LEV 5 EST PT Modifier: Quantity: 1 Physician Procedures CPT4: Code [id number] Description: [id number] - WC PHYS LEVEL 3 - EST PT ICD-10 Diagnosis Description L89.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

## 2019-06-16 NOTE — Progress Notes (Signed)
Norma [name], Norma [name] ([id number]) Visit Report for 04/07/2019 HPI Details Patient Name: Date of Service: Norma [name], Norma [name] 04/07/2019 2:15 PM Medical Record [id number] Patient Account Number: Date of Birth/Sex: 04/25/1946 (73 y.o. Female) Treating RN: Carlene Coria Primary Care Provider: Precious Haws Other Clinician: Referring Provider: Treating Provider/Extender:Adron Geisel, Esperanza Richters in Treatment: 3 History of Present Illness HPI Description: ADMISSION 03/13/2019 This is a 73 year old woman who is a type II diabetic and recently started on hemodialysis this week. She is accompanied by her daughter. They think that she has had wounds possibly for as long as 4 to 5 months but the daughter only became aware of the issues within the last several weeks. This was noted by her nephrologist and she was referred here and also to general surgery for biopsy. She was placed on doxycycline out of concern for possible emergent infection. The patient actually has several wound areas of concern; 1. Superficial small areas on the right anterior mid tibia which looks like chronic venous insufficiency/fluid overload type of injury. These do not look particularly ominous although the wound area is large 2. Large necrotic wound on the right lateral hip I guess. There is a small satellite lesion underneath this. Necrotic circumference around the wound and clearly exposed muscle. 3. Large necrotic mirror image on the left lateral hip. Again exposed muscle not as much necrotic debris. 4. Multiple areas of firm subcutaneous nodules. this includes on her abdomen massively on the right anterior thigh greater than left anterior thigh. These are not particularly painful and do not feel like lipomas. Nevertheless there is no history here. She says she showed these to her primary doctor roughly 3 to 4 weeks ago. The patient was in the ER on 03/08/2019 she was bleeding from the left hip wound. Apparently by the time she got  to the ER this it stopped. The only thing notable here was an albumin level of 2.6. I do not see any x-rays or cultures. She did have an x-ray of the right tib-fib earlier in August and this was negative. Past medical history; hypertension, PAD, type 2 diabetes, chronic renal failure stage V with recent initiation of dialysis anemia of chronic disease,o Calciphylaxis ABIs in our clinic were noncompressible on the right. We did not test the left side 9/10-Patient returns to clinic 1 week after being established for bilateral trochanteric and bilateral lower extremity ulcers in the lower leg right posterior and right anterior. Patient was started on silver alginate dressings. Biopsies were performed last time on the anterior thigh indurated areas and also from the right hip wound. The biopsy results indicate fat necrosis suggestive of trauma/pressure injury. 9/22; I admitted this patient to clinic 2-1/2 weeks ago. She is an unfortunate patient with deep necrotic areas over the lateral hips bilaterally. She also has subcutaneous masses on her anterior thigh and mostly in her right lower part of her abdomen. Finally she has wounds on the right anterior and right posterior calf. 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 They reversed her fistula and she now has a sub-clavian catheter in for dialysis. She started dialysis 6 weeks ago. Unfortunately the multiple biopsies I did on 9 4 were not really helpful other than to say that they did not see definitive features of calciphylaxis nor a primary vasculitic pro process. There was a dense mixed inflammatory cell infiltrate. The punch biopsies I did of the  mass on her anterior thighs showed focal fat necrosis making me wonder about panniculitis. AFB and Fite stains were negative for mycobacteria and a PAS stain was negative for fungus. Other than that  I am still not certain of the diagnosis. 9/29; the patient comes in this week with absolutely no improvement in any of her wound areas. In fact she has new areas on the left distal trochanter and left ischium. She continues to have an ischemic gangrenous-looking left fourth finger. Her shunt to the left arm is been revised and she is now dialyzing through I believe a subclavian line on the right chest. Beyond that she still has the necrotic wounds over her bilateral hips. There is firmness even beyond where these massive wounds are suggesting to me that there is still ongoing involvement which is likely to make these areas worse. She has areas on her anterior thighs x2 bilaterally. 2 of these are actual biopsy sites from the punch biopsies I did. The patient states she is not in a lot of pain. Her sisters x2 who are 2 of her caregivers come in saying they are exhausted with her care. They wonder if she can be hospitalized and then sent to a nursing home. The patient stated she needed time to think about this. She will definitely need to agree to the process Electronic Signature(s) Signed: 04/07/2019 5:43:25 PM By: Linton Ham MD Entered By: Linton Ham on 04/07/2019 17:35:00 -------------------------------------------------------------------------------- Physical Exam Details Patient Name: Date of Service: Norma [name] 04/07/2019 2:15 PM Medical Record [id number] Patient Account Number: Date of Birth/Sex: December 27, 1945 (73 y.o. Female) Treating RN: Carlene Coria Primary Care Provider: Precious Haws Other Clinician: Referring Provider: Treating Provider/Extender:Linc Renne, Esperanza Richters in Treatment: 3 Constitutional Sitting or standing Blood Pressure is within target range for patient.. Pulse regular and within target range for patient.Marland Kitchen Respirations regular, non-labored and within target range.. Temperature is normal and within the target range for the patient.. Very frail  chronically ill looking woman. Gastrointestinal (GI) Multiple subcutaneous nodules especially in the lower abdomen. No liver or spleen enlargement. Psychiatric appears at normal baseline. Notes Wound exam; the patient's original wounds which are mirror-image large necrotic areas on the lateral part of both greater trochanters. There is still necrotic tissue around the area especially on the left. Nothing is viable here. Beyond the wound border borders there is firm areas which I think represent the same subcutaneous masses that she has in her abdomen and thighs. Substantial areas on the right anterior and left anterior thighs. 2 of the open areas are actually punch biopsies I did necrotic areas on the right. Gangrenous left fourth finger area on the left hand. She has new areas on the sacrum, left issue him as well as new areas on the left distal trochanter just beyond the large wound there. Electronic Signature(s) Signed: 04/07/2019 5:43:25 PM By: Linton Ham MD Entered By: Linton Ham on 04/07/2019 17:37:09 -------------------------------------------------------------------------------- Physician Orders Details Patient Name: Date of Service: Norma [name] 04/07/2019 2:15 PM Medical Record [id number] Patient Account Number: Date of Birth/Sex: 03-Apr-1946 (73 y.o. Female) Treating RN: Carlene Coria Primary Care Provider: Precious Haws Other Clinician: Referring Provider: Treating Provider/Extender:Doyel Mulkern, Esperanza Richters in Treatment: 3 Verbal / Phone Orders: No Diagnosis Coding ICD-10 Coding Code Description L89.214 Pressure ulcer of right hip, stage 4 L89.224 Pressure ulcer of left hip, stage 4 L97.811 Non-pressure chronic ulcer of other part of right lower leg limited to breakdown of skin I87.333 Chronic venous hypertension (idiopathic) with ulcer and inflammation  of bilateral lower extremity E11.22 Type 2 diabetes mellitus with diabetic chronic kidney  disease Follow-up Appointments Return Appointment in 2 weeks. Dressing Change Frequency Wound #1 Right,Anterior Lower Leg Change dressing three times week. - all wounds and as needed for excessive drainage especially to bilateral trochanter and upper leg wounds. Wound #10 Left,Posterior Upper Leg Change dressing three times week. - all wounds and as needed for excessive drainage especially to bilateral trochanter and upper leg wounds. Wound #11 Left Hand - 4th Digit Change dressing three times week. - all wounds and as needed for excessive drainage especially to bilateral trochanter and upper leg wounds. Wound #12 Left Hand - Dorsum Change dressing three times week. - all wounds and as needed for excessive drainage especially to bilateral trochanter and upper leg wounds. Wound #13 Left,Distal Trochanter Change dressing three times week. - all wounds and as needed for excessive drainage especially to bilateral trochanter and upper leg wounds. Wound #14 Left Ilium Change dressing three times week. - all wounds and as needed for excessive drainage especially to bilateral trochanter and upper leg wounds. Wound #2 Right,Posterior Lower Leg Change dressing three times week. - all wounds and as needed for excessive drainage especially to bilateral trochanter and upper leg wounds. Wound #3 Right Trochanter Change dressing three times week. - all wounds and as needed for excessive drainage especially to bilateral trochanter and upper leg wounds. Wound #4 Left Trochanter Change dressing three times week. - all wounds and as needed for excessive drainage especially to bilateral trochanter and upper leg wounds. Wound #5 Right,Anterior Upper Leg Change dressing three times week. - all wounds and as needed for excessive drainage especially to bilateral trochanter and upper leg wounds. Wound #6 Right,Medial Upper Leg Change dressing three times week. - all wounds and as needed for excessive  drainage especially to bilateral trochanter and upper leg wounds. Wound #7 Left,Anterior Upper Leg Change dressing three times week. - all wounds and as needed for excessive drainage especially to bilateral trochanter and upper leg wounds. Wound #8 Right,Distal,Posterior Lower Leg Change dressing three times week. - all wounds and as needed for excessive drainage especially to bilateral trochanter and upper leg wounds. Wound #9 Sacrum Change dressing three times week. - all wounds and as needed for excessive drainage especially to bilateral trochanter and upper leg wounds. Wound Cleansing Wound #1 Right,Anterior Lower Leg May shower and wash wound with soap and water. Wound #10 Left,Posterior Upper Leg May shower and wash wound with soap and water. Wound #11 Left Hand - 4th Digit May shower and wash wound with soap and water. Wound #12 Left Hand - Dorsum May shower and wash wound with soap and water. Wound #13 Left,Distal Trochanter May shower and wash wound with soap and water. Wound #14 Left Ilium May shower and wash wound with soap and water. Wound #2 Right,Posterior Lower Leg May shower and wash wound with soap and water. Wound #3 Right Trochanter May shower and wash wound with soap and water. Wound #4 Left Trochanter May shower and wash wound with soap and water. Wound #5 Right,Anterior Upper Leg May shower and wash wound with soap and water. Wound #6 Right,Medial Upper Leg May shower and wash wound with soap and water. Wound #7 Left,Anterior Upper Leg May shower and wash wound with soap and water. Wound #8 Right,Distal,Posterior Lower Leg May shower and wash wound with soap and water. Wound #9 Sacrum May shower and wash wound with soap and water. Primary Wound Dressing Wound #  1 Right,Anterior Lower Leg Calcium Alginate with Silver Wound #10 Left,Posterior Upper Leg Calcium Alginate with Silver Wound #11 Left Hand - 4th Digit Calcium Alginate with Silver Wound #12  Left Hand - Dorsum Calcium Alginate with Silver Wound #13 Left,Distal Trochanter Calcium Alginate with Silver Wound #14 Left Ilium Calcium Alginate with Silver Wound #2 Right,Posterior Lower Leg Calcium Alginate with Silver Wound #3 Right Trochanter Calcium Alginate with Silver Wound #4 Left Trochanter Calcium Alginate with Silver Wound #5 Right,Anterior Upper Leg Calcium Alginate with Silver Wound #6 Right,Medial Upper Leg Calcium Alginate with Silver Wound #7 Left,Anterior Upper Leg Calcium Alginate with Silver Wound #8 Right,Distal,Posterior Lower Leg Calcium Alginate with Silver Wound #9 Sacrum Calcium Alginate with Silver Secondary Dressing Wound #1 Right,Anterior Lower Leg Dry Gauze ABD pad - secure with tape Wound #10 Left,Posterior Upper Leg Dry Gauze ABD pad - secure with tape Wound #11 Left Hand - 4th Digit Dry Gauze ABD pad - secure with tape Wound #12 Left Hand - Dorsum Dry Gauze ABD pad - secure with tape Wound #13 Left,Distal Trochanter Dry Gauze ABD pad - secure with tape Wound #14 Left Ilium Dry Gauze ABD pad - secure with tape Wound #2 Right,Posterior Lower Leg Dry Gauze ABD pad - secure with tape Wound #3 Right Trochanter Dry Gauze ABD pad - secure with tape Wound #4 Left Trochanter Dry Gauze ABD pad - secure with tape Wound #5 Right,Anterior Upper Leg Dry Gauze ABD pad - secure with tape Wound #6 Right,Medial Upper Leg Dry Gauze ABD pad - secure with tape Wound #7 Left,Anterior Upper Leg Dry Gauze ABD pad - secure with tape Wound #8 Right,Distal,Posterior Lower Leg Dry Gauze ABD pad - secure with tape Wound #9 Sacrum Dry Gauze ABD pad - secure with tape Edema Control Wound #1 Right,Anterior Lower Leg Kerlix and Coban - Right Lower Extremity Elevate legs to the level of the heart or above for 30 minutes daily and/or when sitting, a frequency of: - throughout the day Wound #2 Right,Posterior Lower Leg Kerlix and Coban - Right  Lower Extremity Elevate legs to the level of the heart or above for 30 minutes daily and/or when sitting, a frequency of: - throughout the day Wound #8 Right,Distal,Posterior Lower Leg Kerlix and Coban - Right Lower Extremity Elevate legs to the level of the heart or above for 30 minutes daily and/or when sitting, a frequency of: - throughout the day Off-Loading Air fluidized (Group 3) mattress - pending Turn and reposition every 2 hours [facility] for wound care. - Interim- all wounds and as needed for excessive drainage especially to bilateral trochanter and upper leg wounds. Electronic Signature(s) Signed: 04/07/2019 5:43:25 PM By: Linton Ham MD Signed: 06/16/2019 2:58:33 PM By: Carlene Coria RN Entered By: Carlene Coria on 04/07/2019 16:09:44 -------------------------------------------------------------------------------- Problem List Details Patient Name: Date of Service: Norma [name]. 04/07/2019 2:15 PM Medical Record [id number] Patient Account Number: Date of Birth/Sex: 06/26/1946 (73 y.o. Female) Treating RN: Carlene Coria Primary Care Provider: Precious Haws Other Clinician: Referring Provider: Treating Provider/Extender:Kevis Qu, Esperanza Richters in Treatment: 3 Active Problems ICD-10 Evaluated Encounter Code Description Active Date Today Diagnosis L89.214 Pressure ulcer of right hip, stage 4 03/13/2019 No Yes L89.224 Pressure ulcer of left hip, stage 4 03/13/2019 No Yes L97.811 Non-pressure chronic ulcer of other part of right lower 03/13/2019 No Yes leg limited to breakdown of skin I87.333 Chronic venous hypertension (idiopathic) with ulcer 03/13/2019 No Yes and inflammation of bilateral lower extremity E11.22 Type  2 diabetes mellitus with diabetic chronic kidney 03/13/2019 No Yes disease M79.3 Panniculitis, unspecified 04/07/2019 No Yes L89.150 Pressure ulcer of sacral region, unstageable 04/07/2019 No Yes Inactive  Problems ICD-10 Code Description Active Date Inactive Date E83.59 Other disorders of calcium metabolism 03/13/2019 03/13/2019 Resolved Problems Electronic Signature(s) Signed: 04/07/2019 5:43:25 PM By: Linton Ham MD Entered By: Linton Ham on 04/07/2019 17:31:24 -------------------------------------------------------------------------------- Progress Note Details Patient Name: Date of Service: Norma [name]. 04/07/2019 2:15 PM Medical Record [id number] Patient Account Number: Date of Birth/Sex: [birth year] (73 y.o. Female) Treating RN: Carlene Coria Primary Care Provider: Precious Haws Other Clinician: Referring Provider: Treating Provider/Extender:Ronnie Mallette, Esperanza Richters in Treatment: 3 Subjective History of Present Illness (HPI) ADMISSION 03/13/2019 This is a 73 year old woman who is a type II diabetic and recently started on hemodialysis this week. She is accompanied by her daughter. They think that she has had wounds possibly for as long as 4 to 5 months but the daughter only became aware of the issues within the last several weeks. This was noted by her nephrologist and she was referred here and also to general surgery for biopsy. She was placed on doxycycline out of concern for possible emergent infection. The patient actually has several wound areas of concern; 1. Superficial small areas on the right anterior mid tibia which looks like chronic venous insufficiency/fluid overload type of injury. These do not look particularly ominous although the wound area is large 2. Large necrotic wound on the right lateral hip I guess. There is a small satellite lesion underneath this. Necrotic circumference around the wound and clearly exposed muscle. 3. Large necrotic mirror image on the left lateral hip. Again exposed muscle not as much necrotic debris. 4. Multiple areas of firm subcutaneous nodules. this includes on her abdomen massively on the right anterior thigh greater than left  anterior thigh. These are not particularly painful and do not feel like lipomas. Nevertheless there is no history here. She says she showed these to her primary doctor roughly 3 to 4 weeks ago. The patient was in the ER on 03/08/2019 she was bleeding from the left hip wound. Apparently by the time she got to the ER this it stopped. The only thing notable here was an albumin level of 2.6. I do not see any x-rays or cultures. She did have an x-ray of the right tib-fib earlier in August and this was negative. Past medical history; hypertension, PAD, type 2 diabetes, chronic renal failure stage V with recent initiation of dialysis anemia of chronic disease,o Calciphylaxis ABIs in our clinic were noncompressible on the right. We did not test the left side 9/10-Patient returns to clinic 1 week after being established for bilateral trochanteric and bilateral lower extremity ulcers in the lower leg right posterior and right anterior. Patient was started on silver alginate dressings. Biopsies were performed last time on the anterior thigh indurated areas and also from the right hip wound. The biopsy results indicate fat necrosis suggestive of trauma/pressure injury. 9/22; I admitted this patient to clinic 2-1/2 weeks ago. She is an unfortunate patient with deep necrotic areas over the lateral hips bilaterally. She also has subcutaneous masses on her anterior thigh and mostly in her right lower part of her abdomen. Finally she has wounds on the right anterior and right posterior calf. 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 They reversed her fistula and she now has a  sub-clavian catheter in for dialysis. She started dialysis 6 weeks ago. Unfortunately the multiple biopsies I did on 9 4 were not really helpful other than to say that they did not see definitive features of calciphylaxis nor a primary  vasculitic pro process. There was a dense mixed inflammatory cell infiltrate. The punch biopsies I did of the mass on her anterior thighs showed focal fat necrosis making me wonder about panniculitis. AFB and Fite stains were negative for mycobacteria and a PAS stain was negative for fungus. Other than that I am still not certain of the diagnosis. 9/29; the patient comes in this week with absolutely no improvement in any of her wound areas. In fact she has new areas on the left distal trochanter and left ischium. She continues to have an ischemic gangrenous-looking left fourth finger. Her shunt to the left arm is been revised and she is now dialyzing through I believe a subclavian line on the right chest. Beyond that she still has the necrotic wounds over her bilateral hips. There is firmness even beyond where these massive wounds are suggesting to me that there is still ongoing involvement which is likely to make these areas worse. She has areas on her anterior thighs x2 bilaterally. 2 of these are actual biopsy sites from the punch biopsies I did. The patient states she is not in a lot of pain. Her sisters x2 who are 2 of her caregivers come in saying they are exhausted with her care. They wonder if she can be hospitalized and then sent to a nursing home. The patient stated she needed time to think about this. She will definitely need to agree to the process Objective Constitutional Sitting or standing Blood Pressure is within target range for patient.. Pulse regular and within target range for patient.Marland Kitchen Respirations regular, non-labored and within target range.. Temperature is normal and within the target range for the patient.. Very frail chronically ill looking woman. Vitals Time Taken: 2:43 PM, Height: 62 in, Weight: 162 lbs, BMI: 29.6, Temperature: 98.2 F, Pulse: 59 bpm, Respiratory Rate: 20 breaths/min, Blood Pressure: 122/88 mmHg. Gastrointestinal (GI) Multiple subcutaneous nodules  especially in the lower abdomen. No liver or spleen enlargement. Psychiatric appears at normal baseline. General Notes: Wound exam; the patient's original wounds which are mirror-image large necrotic areas on the lateral part of both greater trochanters. There is still necrotic tissue around the area especially on the left. Nothing is viable here. Beyond the wound border borders there is firm areas which I think represent the same subcutaneous masses that she has in her abdomen and thighs. ooSubstantial areas on the right anterior and left anterior thighs. 2 of the open areas are actually punch biopsies I did necrotic areas on the right. ooGangrenous left fourth finger area on the left hand. ooShe has new areas on the sacrum, left issue him as well as new areas on the left distal trochanter just beyond the large wound there. Integumentary (Hair, Skin) Wound #1 status is Open. Original cause of wound was Gradually Appeared. The wound is located on the Right,Anterior Lower Leg. The wound measures 10cm length x 2.5cm width x 0.1cm depth; 19.635cm^2 area and 1.963cm^3 volume. There is Fat Layer (Subcutaneous Tissue) Exposed exposed. There is no tunneling or undermining noted. There is a large amount of serous drainage noted. The wound margin is flat and intact. There is medium (34-66%) pink granulation within the wound bed. There is a medium (34-66%) amount of necrotic tissue within the wound bed including  Adherent Slough. Wound #10 status is Open. Original cause of wound was Shear/Friction. The wound is located on the Left,Posterior Upper Leg. The wound measures 0.1cm length x 0.8cm width x 0.1cm depth; 0.063cm^2 area and 0.006cm^3 volume. There is Fat Layer (Subcutaneous Tissue) Exposed exposed. There is no tunneling or undermining noted. There is a small amount of serosanguineous drainage noted. The wound margin is distinct with the outline attached to the wound base. There is small (1-33%)  red, pink granulation within the wound bed. There is no necrotic tissue within the wound bed. Wound #11 status is Open. Original cause of wound was Gradually Appeared. The wound is located on the Left Hand - 4th Digit. The wound measures 6cm length x 4.3cm width x 0.1cm depth; 20.263cm^2 area and 2.026cm^3 volume. There is Fat Layer (Subcutaneous Tissue) Exposed exposed. There is no tunneling or undermining noted. There is a small amount of serosanguineous drainage noted. The wound margin is flat and intact. There is no granulation within the wound bed. There is a large (67-100%) amount of necrotic tissue within the wound bed including Eschar and Adherent Slough. Wound #12 status is Open. Original cause of wound was Gradually Appeared. The wound is located on the Left Hand - Dorsum. The wound measures 1cm length x 0.8cm width x 0.1cm depth; 0.628cm^2 area and 0.063cm^3 volume. There is Fat Layer (Subcutaneous Tissue) Exposed exposed. There is no tunneling or undermining noted. There is a none present amount of drainage noted. The wound margin is flat and intact. There is small (1-33%) pink granulation within the wound bed. There is a large (67-100%) amount of necrotic tissue within the wound bed including Eschar. Wound #13 status is Open. Original cause of wound was Pressure Injury. The wound is located on the Left,Distal Trochanter. The wound measures 1.7cm length x 1cm width x 0.1cm depth; 1.335cm^2 area and 0.134cm^3 volume. There is Fat Layer (Subcutaneous Tissue) Exposed exposed. There is no tunneling or undermining noted. There is a small amount of serous drainage noted. The wound margin is flat and intact. There is no granulation within the wound bed. There is a large (67-100%) amount of necrotic tissue within the wound bed including Adherent Slough. Wound #14 status is Open. Original cause of wound was Shear/Friction. The wound is located on the Left Ilium. The wound measures 4.7cm  length x 7cm width x 0.1cm depth; 25.84cm^2 area and 2.584cm^3 volume. There is Fat Layer (Subcutaneous Tissue) Exposed exposed. There is no tunneling or undermining noted. There is a medium amount of serous drainage noted. The wound margin is flat and intact. There is small (1-33%) red granulation within the wound bed. There is a large (67-100%) amount of necrotic tissue within the wound bed including Adherent Slough. Wound #2 status is Open. Original cause of wound was Blister. The wound is located on the Right,Posterior Lower Leg. The wound measures 5.5cm length x 5.6cm width x 0.1cm depth; 24.19cm^2 area and 2.419cm^3 volume. There is Fat Layer (Subcutaneous Tissue) Exposed exposed. There is no tunneling or undermining noted. There is a small amount of purulent drainage noted. The wound margin is flat and intact. There is no granulation within the wound bed. There is a large (67-100%) amount of necrotic tissue within the wound bed including Eschar. Wound #3 status is Open. Original cause of wound was Gradually Appeared. The wound is located on the Right Trochanter. The wound measures 11cm length x 8cm width x 2.3cm depth; 69.115cm^2 area and 158.965cm^3 volume. There is muscle and  Fat Layer (Subcutaneous Tissue) Exposed exposed. There is no tunneling noted, however, there is undermining starting at 11:00 and ending at 1:00 with a maximum distance of 2.9cm. There is additional undermining and at 6:00 and ending at 7:00 with a maximum distance of 2.2cm. There is a large amount of serosanguineous drainage noted. The wound margin is well defined and not attached to the wound base. There is medium (34-66%) red, pink, friable granulation within the wound bed. There is a medium (34-66%) amount of necrotic tissue within the wound bed including Eschar, Adherent Slough and Necrosis of Muscle. Wound #4 status is Open. Original cause of wound was Gradually Appeared. The wound is located on the  Left Trochanter. The wound measures 12.8cm length x 6.5cm width x 1.7cm depth; 65.345cm^2 area and 111.087cm^3 volume. There is muscle and Fat Layer (Subcutaneous Tissue) Exposed exposed. There is no tunneling noted, however, there is undermining starting at 7:00 and ending at 12:00 with a maximum distance of 2.4cm. There is a large amount of purulent drainage noted. Foul odor after cleansing was noted. The wound margin is well defined and not attached to the wound base. There is small (1-33%) red, pink, friable granulation within the wound bed. There is a large (67-100%) amount of necrotic tissue within the wound bed including Eschar, Adherent Slough and Necrosis of Muscle. Wound #5 status is Open. Original cause of wound was Gradually Appeared. The wound is located on the Right,Anterior Upper Leg. The wound measures 1.3cm length x 1.5cm width x 2.8cm depth; 1.532cm^2 area and 4.288cm^3 volume. There is Fat Layer (Subcutaneous Tissue) Exposed exposed. There is no tunneling noted, however, there is undermining starting at 12:00 and ending at 12:00 with a maximum distance of 3.3cm. There is a large amount of purulent drainage noted. The wound margin is well defined and not attached to the wound base. There is no granulation within the wound bed. There is a large (67-100%) amount of necrotic tissue within the wound bed including Eschar and Adherent Slough. General Notes: grey, purulent drainage with large area of induration periwound Wound #6 status is Open. Original cause of wound was Gradually Appeared. The wound is located on the Right,Medial Upper Leg. The wound measures 5.4cm length x 4cm width x 0.1cm depth; 16.965cm^2 area and 1.696cm^3 volume. There is Fat Layer (Subcutaneous Tissue) Exposed exposed. There is no tunneling or undermining noted. There is a medium amount of serosanguineous drainage noted. The wound margin is indistinct and nonvisible. There is small (1-33%) pink granulation  within the wound bed. There is a large (67-100%) amount of necrotic tissue within the wound bed including Eschar and Adherent Slough. Wound #7 status is Open. Original cause of wound was Gradually Appeared. The wound is located on the Left,Anterior Upper Leg. The wound measures 1.3cm length x 1.6cm width x 2cm depth; 1.634cm^2 area and 3.267cm^3 volume. There is Fat Layer (Subcutaneous Tissue) Exposed exposed. There is undermining starting at 6:00 and ending at 1:00 with a maximum distance of 0.9cm. There is a medium amount of purulent drainage noted. The wound margin is well defined and not attached to the wound base. There is no granulation within the wound bed. There is a large (67-100%) amount of necrotic tissue within the wound bed including Adherent Slough. Wound #8 status is Open. Original cause of wound was Pressure Injury. The wound is located on the Right,Distal,Posterior Lower Leg. The wound measures 0.5cm length x 0.5cm width x 0.1cm depth; 0.196cm^2 area and 0.02cm^3 volume. There is Fat  Layer (Subcutaneous Tissue) Exposed exposed. There is no tunneling or undermining noted. There is a small amount of serous drainage noted. The wound margin is flat and intact. There is no granulation within the wound bed. There is a large (67-100%) amount of necrotic tissue within the wound bed including Adherent Slough. Wound #9 status is Open. Original cause of wound was Shear/Friction. The wound is located on the Sacrum. The wound measures 5.5cm length x 6.8cm width x 0.1cm depth; 29.374cm^2 area and 2.937cm^3 volume. There is Fat Layer (Subcutaneous Tissue) Exposed exposed. There is no tunneling or undermining noted. There is a medium amount of serosanguineous drainage noted. The wound margin is flat and intact. There is medium (34-66%) red granulation within the wound bed. There is a medium (34-66%) amount of necrotic tissue within the wound bed including Adherent Slough. Assessment Active  Problems ICD-10 Pressure ulcer of right hip, stage 4 Pressure ulcer of left hip, stage 4 Non-pressure chronic ulcer of other part of right lower leg limited to breakdown of skin Chronic venous hypertension (idiopathic) with ulcer and inflammation of bilateral lower extremity Type 2 diabetes mellitus with diabetic chronic kidney disease Panniculitis, unspecified Pressure ulcer of sacral region, unstageable Plan Follow-up Appointments: Return Appointment in 2 weeks. Dressing Change Frequency: Wound #1 Right,Anterior Lower Leg: Change dressing three times week. - all wounds and as needed for excessive drainage especially to bilateral trochanter and upper leg wounds. Wound #10 Left,Posterior Upper Leg: Change dressing three times week. - all wounds and as needed for excessive drainage especially to bilateral trochanter and upper leg wounds. Wound #11 Left Hand - 4th Digit: Change dressing three times week. - all wounds and as needed for excessive drainage especially to bilateral trochanter and upper leg wounds. Wound #12 Left Hand - Dorsum: Change dressing three times week. - all wounds and as needed for excessive drainage especially to bilateral trochanter and upper leg wounds. Wound #13 Left,Distal Trochanter: Change dressing three times week. - all wounds and as needed for excessive drainage especially to bilateral trochanter and upper leg wounds. Wound #14 Left Ilium: Change dressing three times week. - all wounds and as needed for excessive drainage especially to bilateral trochanter and upper leg wounds. Wound #2 Right,Posterior Lower Leg: Change dressing three times week. - all wounds and as needed for excessive drainage especially to bilateral trochanter and upper leg wounds. Wound #3 Right Trochanter: Change dressing three times week. - all wounds and as needed for excessive drainage especially to bilateral trochanter and upper leg wounds. Wound #4 Left Trochanter: Change  dressing three times week. - all wounds and as needed for excessive drainage especially to bilateral trochanter and upper leg wounds. Wound #5 Right,Anterior Upper Leg: Change dressing three times week. - all wounds and as needed for excessive drainage especially to bilateral trochanter and upper leg wounds. Wound #6 Right,Medial Upper Leg: Change dressing three times week. - all wounds and as needed for excessive drainage especially to bilateral trochanter and upper leg wounds. Wound #7 Left,Anterior Upper Leg: Change dressing three times week. - all wounds and as needed for excessive drainage especially to bilateral trochanter and upper leg wounds. Wound #8 Right,Distal,Posterior Lower Leg: Change dressing three times week. - all wounds and as needed for excessive drainage especially to bilateral trochanter and upper leg wounds. Wound #9 Sacrum: Change dressing three times week. - all wounds and as needed for excessive drainage especially to bilateral trochanter and upper leg wounds. Wound Cleansing: Wound #1 Right,Anterior Lower Leg:  May shower and wash wound with soap and water. Wound #10 Left,Posterior Upper Leg: May shower and wash wound with soap and water. Wound #11 Left Hand - 4th Digit: May shower and wash wound with soap and water. Wound #12 Left Hand - Dorsum: May shower and wash wound with soap and water. Wound #13 Left,Distal Trochanter: May shower and wash wound with soap and water. Wound #14 Left Ilium: May shower and wash wound with soap and water. Wound #2 Right,Posterior Lower Leg: May shower and wash wound with soap and water. Wound #3 Right Trochanter: May shower and wash wound with soap and water. Wound #4 Left Trochanter: May shower and wash wound with soap and water. Wound #5 Right,Anterior Upper Leg: May shower and wash wound with soap and water. Wound #6 Right,Medial Upper Leg: May shower and wash wound with soap and water. Wound #7 Left,Anterior  Upper Leg: May shower and wash wound with soap and water. Wound #8 Right,Distal,Posterior Lower Leg: May shower and wash wound with soap and water. Wound #9 Sacrum: May shower and wash wound with soap and water. Primary Wound Dressing: Wound #1 Right,Anterior Lower Leg: Calcium Alginate with Silver Wound #10 Left,Posterior Upper Leg: Calcium Alginate with Silver Wound #11 Left Hand - 4th Digit: Calcium Alginate with Silver Wound #12 Left Hand - Dorsum: Calcium Alginate with Silver Wound #13 Left,Distal Trochanter: Calcium Alginate with Silver Wound #14 Left Ilium: Calcium Alginate with Silver Wound #2 Right,Posterior Lower Leg: Calcium Alginate with Silver Wound #3 Right Trochanter: Calcium Alginate with Silver Wound #4 Left Trochanter: Calcium Alginate with Silver Wound #5 Right,Anterior Upper Leg: Calcium Alginate with Silver Wound #6 Right,Medial Upper Leg: Calcium Alginate with Silver Wound #7 Left,Anterior Upper Leg: Calcium Alginate with Silver Wound #8 Right,Distal,Posterior Lower Leg: Calcium Alginate with Silver Wound #9 Sacrum: Calcium Alginate with Silver Secondary Dressing: Wound #1 Right,Anterior Lower Leg: Dry Gauze ABD pad - secure with tape Wound #10 Left,Posterior Upper Leg: Dry Gauze ABD pad - secure with tape Wound #11 Left Hand - 4th Digit: Dry Gauze ABD pad - secure with tape Wound #12 Left Hand - Dorsum: Dry Gauze ABD pad - secure with tape Wound #13 Left,Distal Trochanter: Dry Gauze ABD pad - secure with tape Wound #14 Left Ilium: Dry Gauze ABD pad - secure with tape Wound #2 Right,Posterior Lower Leg: Dry Gauze ABD pad - secure with tape Wound #3 Right Trochanter: Dry Gauze ABD pad - secure with tape Wound #4 Left Trochanter: Dry Gauze ABD pad - secure with tape Wound #5 Right,Anterior Upper Leg: Dry Gauze ABD pad - secure with tape Wound #6 Right,Medial Upper Leg: Dry Gauze ABD pad - secure with tape Wound #7 Left,Anterior  Upper Leg: Dry Gauze ABD pad - secure with tape Wound #8 Right,Distal,Posterior Lower Leg: Dry Gauze ABD pad - secure with tape Wound #9 Sacrum: Dry Gauze ABD pad - secure with tape Edema Control: Wound #1 Right,Anterior Lower Leg: Kerlix and Coban - Right Lower Extremity Elevate legs to the level of the heart or above for 30 minutes daily and/or when sitting, a frequency of: - throughout the day Wound #2 Right,Posterior Lower Leg: Kerlix and Coban - Right Lower Extremity Elevate legs to the level of the heart or above for 30 minutes daily and/or when sitting, a frequency of: - throughout the day Wound #8 Right,Distal,Posterior Lower Leg: Kerlix and Coban - Right Lower Extremity Elevate legs to the level of the heart or above for 30 minutes daily and/or when sitting,  a frequency of: - throughout the day Off-Loading: Air fluidized (Group 3) mattress - pending Turn and reposition every 2 hours Home Health: [facility] for wound care. - Interim- all wounds and as needed for excessive drainage especially to bilateral trochanter and upper leg wounds. 1. I continued silver alginate to all of these wounds. 2. I think this lady has some form of fat necrosis. Even the areas on her lateral trochanters are probably not pressure ulcers although it would be difficult to prove that in either direction. She states they have been there for about 6 months, her family only knew about this for about a month. 3. I think this lady is slipping in terms of her energy level. She looks pale. I think she probably should go to the hospital and be discharged to a nursing home for extensive ongoing wound care. Perhaps an LTAC. 4. She has new areas on her left issue him which indeed are probably pressure ulcers. Electronic Signature(s) Signed: 04/07/2019 5:43:25 PM By: Linton Ham MD Entered By: Linton Ham on 04/07/2019  17:38:40 -------------------------------------------------------------------------------- SuperBill Details Patient Name: Date of Service: Norma [name] 04/07/2019 Medical Record [id number] Patient Account Number: Date of Birth/Sex: Treating RN: Aug 05, 1945 (73 y.o. 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 E11.22 Type 2 diabetes mellitus with diabetic chronic kidney disease Facility Procedures CPT4 Code: [id number] Description: [id number] - WOUND CARE VISIT-LEV 5 EST PT Modifier: Quantity: 1 Physician Procedures CPT4: Code [id number] Description: [id number] - WC PHYS LEVEL 3 - EST PT ICD-10 Diagnosis Description L89.214 Pressure ulcer of right hip, stage 4 L89.224 Pressure ulcer of left hip, stage 4 L97.811 Non-pressure chronic ulcer of other part of right lower skin I87.333 Chronic venous  hypertension (idiopathic) with ulcer and lower extremity Modifier: leg limited to br inflammation of b Quantity: 1 eakdown of ilateral Electronic Signature(s) Signed: 04/07/2019 5:43:25 PM By: Linton Ham MD Entered By: Linton Ham on 04/07/2019 17:39:04

## 2019-06-18 ENCOUNTER — Ambulatory Visit: Payer: Medicare Other

## 2019-06-18 ENCOUNTER — Other Ambulatory Visit: Payer: Medicare Other

## 2019-06-25 ENCOUNTER — Ambulatory Visit: Payer: Medicare Other | Admitting: Cardiology

## 2020-02-23 IMAGING — DX DG CHEST 1V PORT
1 series · 1 of 1 positions shown · non-contrast
Comparison: 03/30/2019

CLINICAL DATA: Decreased blood sugar. Hypoglycemia. Evaluate for
infection.

EXAM:
PORTABLE CHEST 1 VIEW

[chest ap]
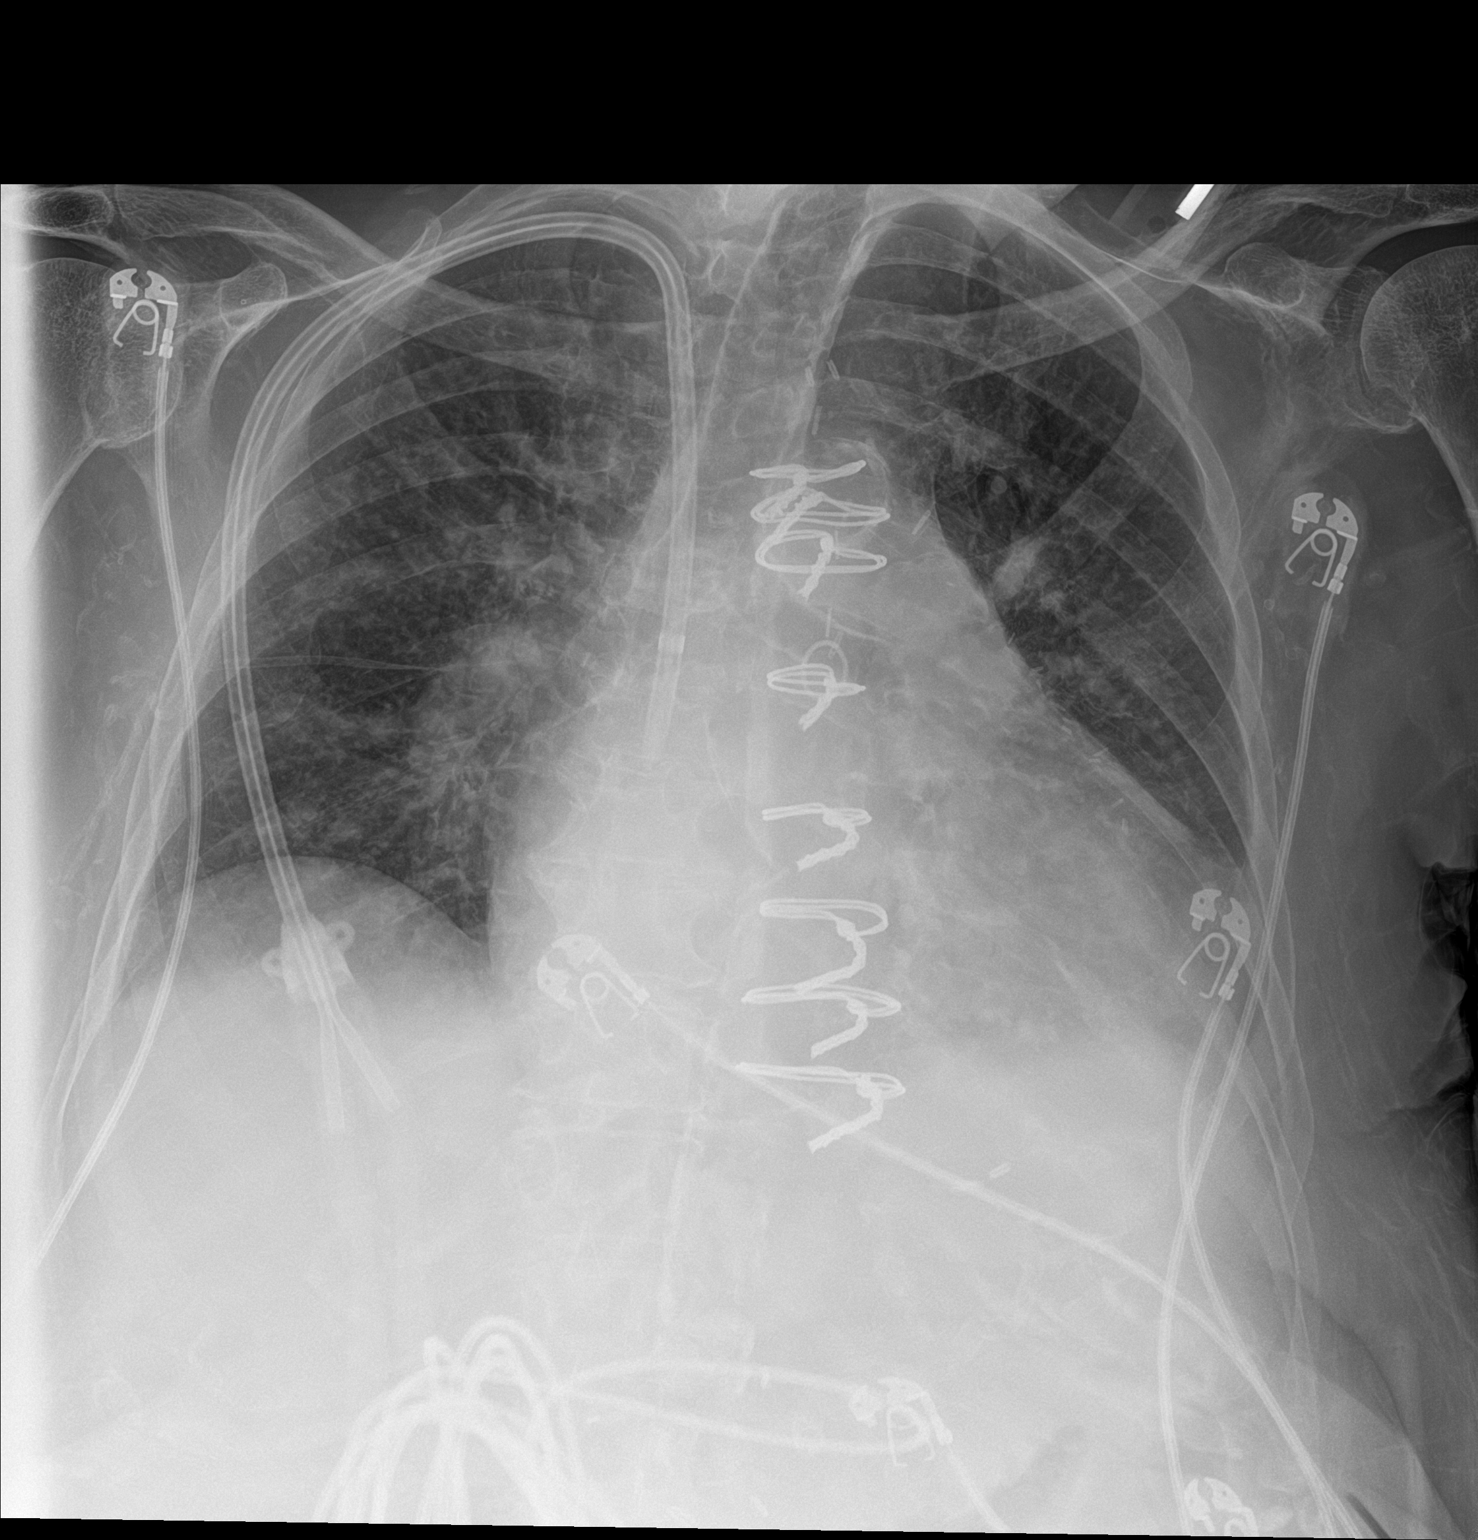

[1 of 1 positions shown; findings below may reference images not displayed]

FINDINGS: Right chest wall port a catheter is identified with tip in the
projection of the cavoatrial junction. Previous median sternotomy
and CABG procedure. Cardiac enlargement. Aortic atherosclerotic
calcifications. Bilateral pleural effusions with pulmonary vascular
congestion.
IMPRESSION: 1. Cardiac enlargement and mild CHF.
2.  Aortic Atherosclerosis ([id number]).

## 2020-02-23 IMAGING — DX DG HAND COMPLETE 3+V*L*
3 series · 3 of 3 positions shown · non-contrast
Comparison: None.

CLINICAL DATA: Gangrenous left index finger

EXAM:
LEFT HAND - COMPLETE 3+ VIEW

[hand ap]
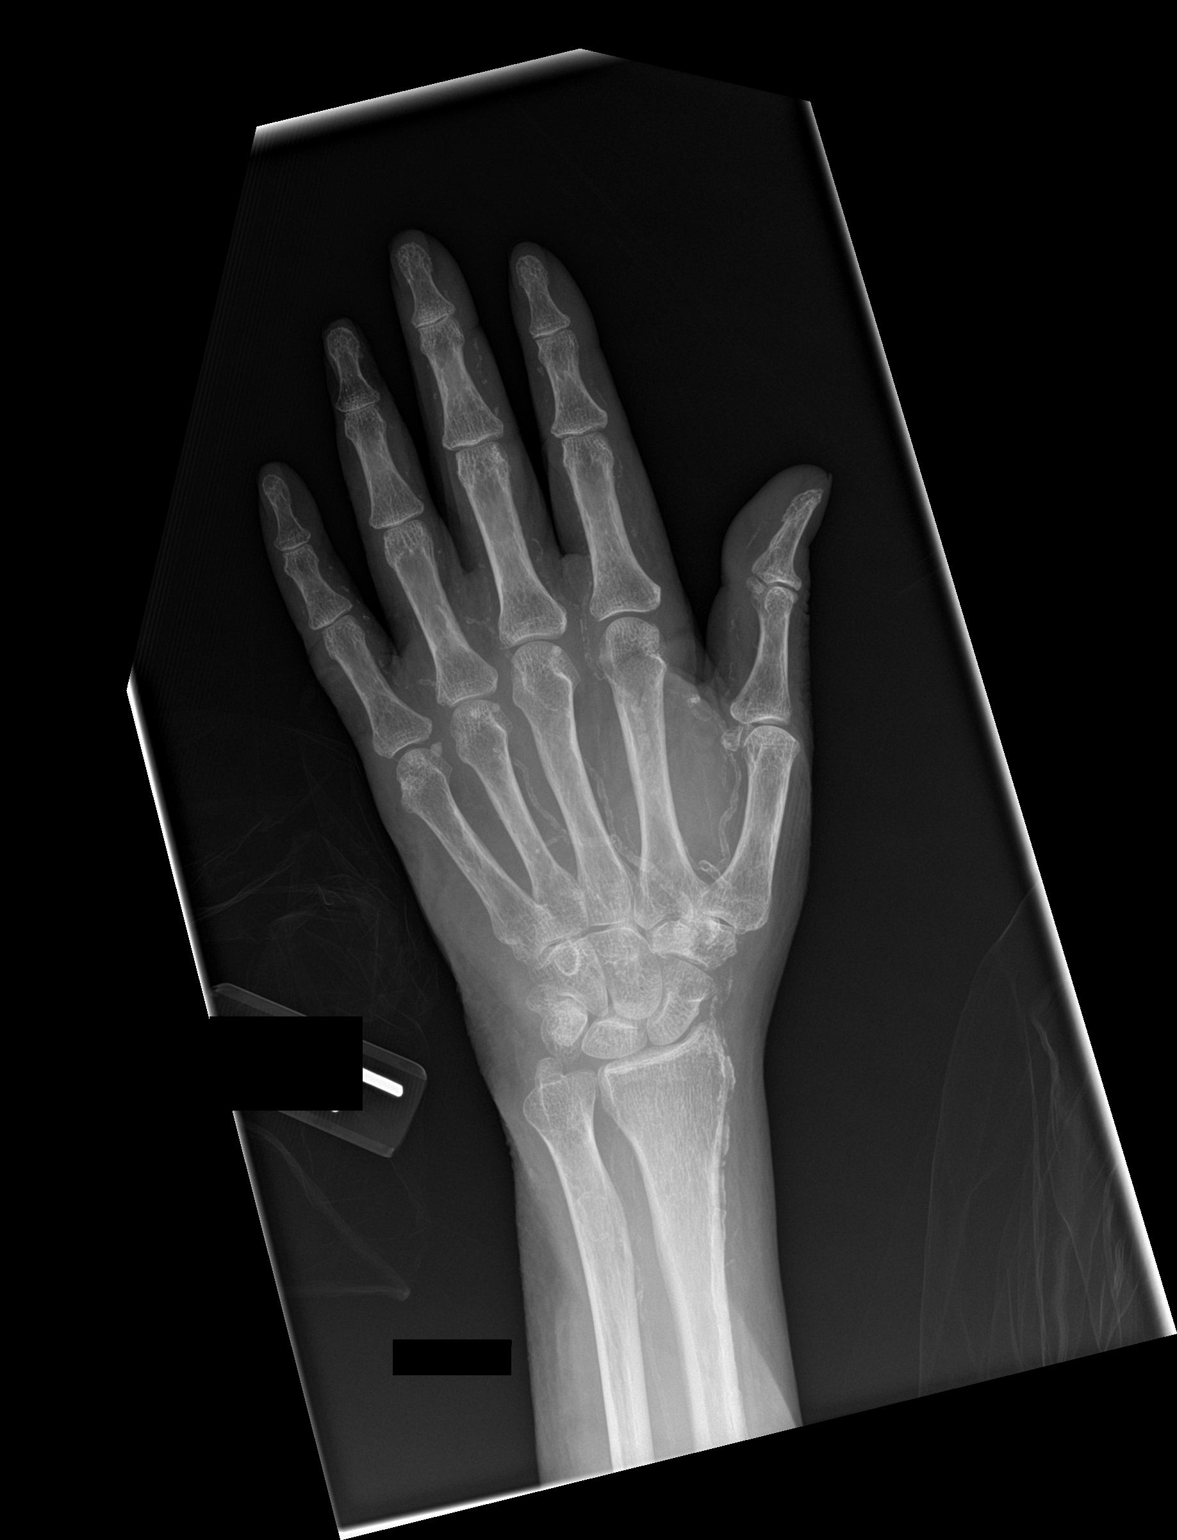

[hand obl]
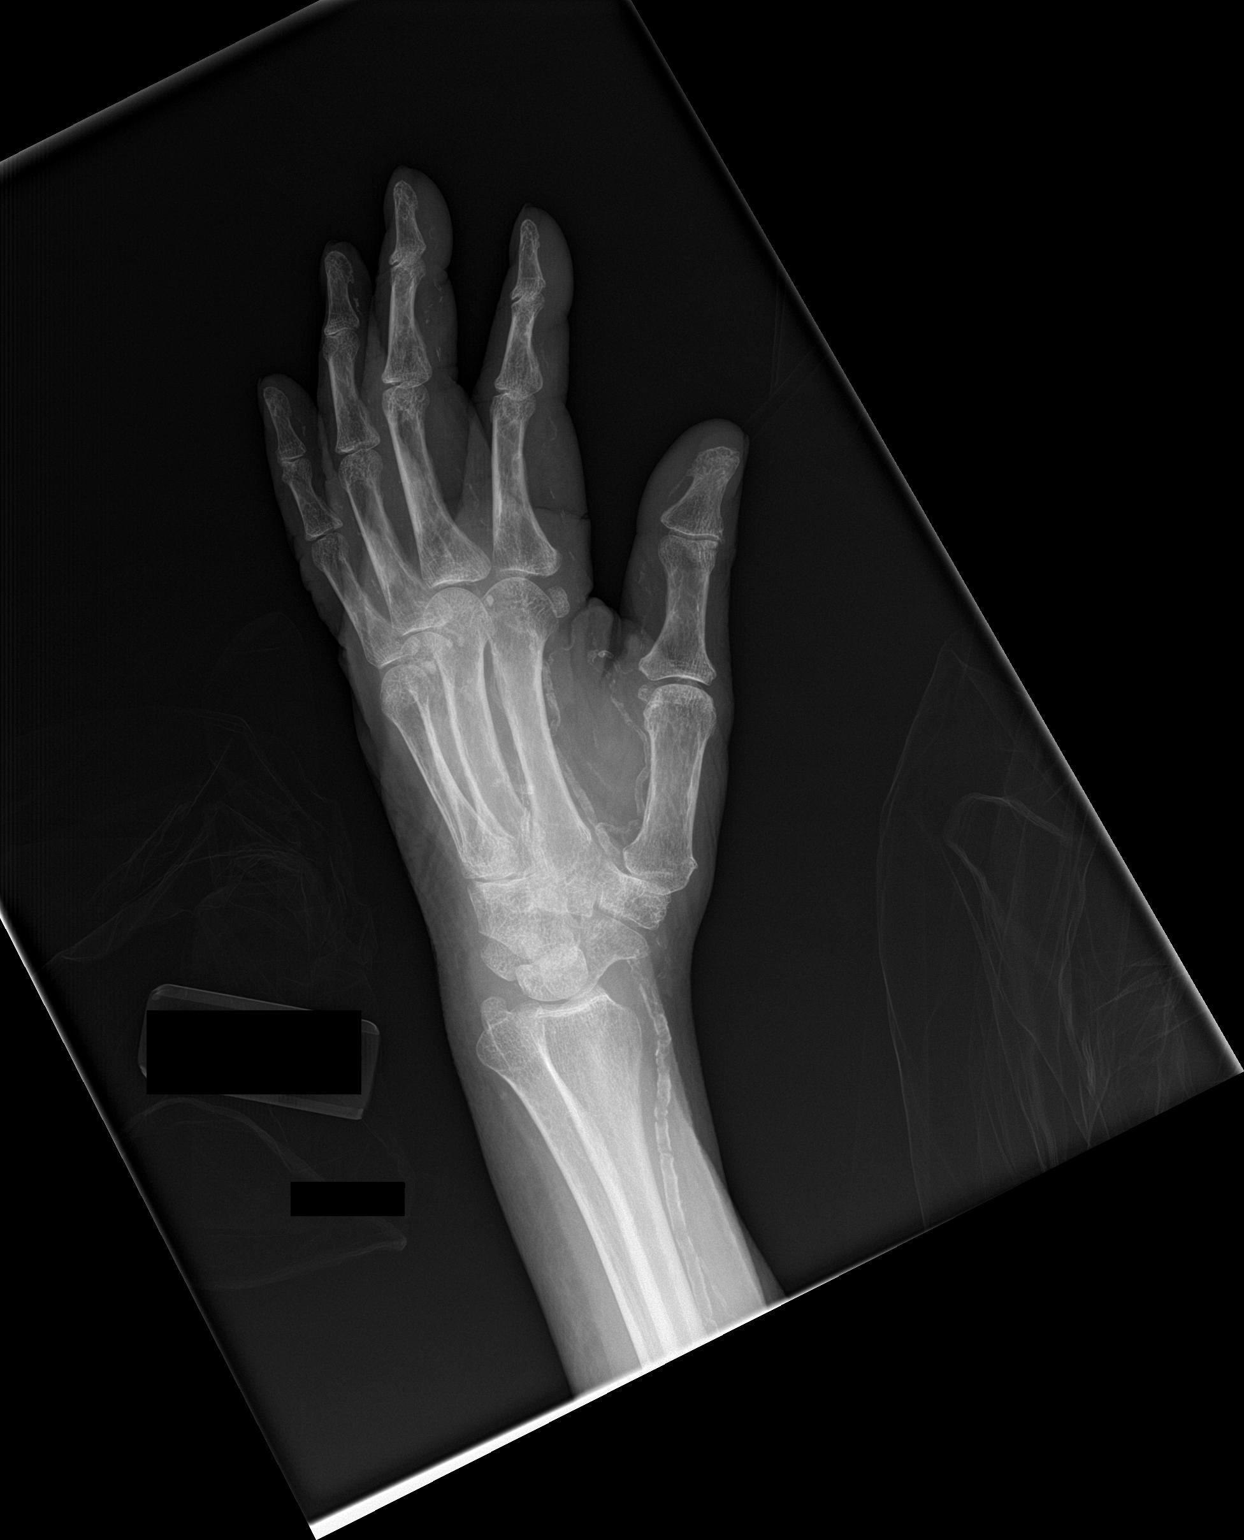

[hand lat]
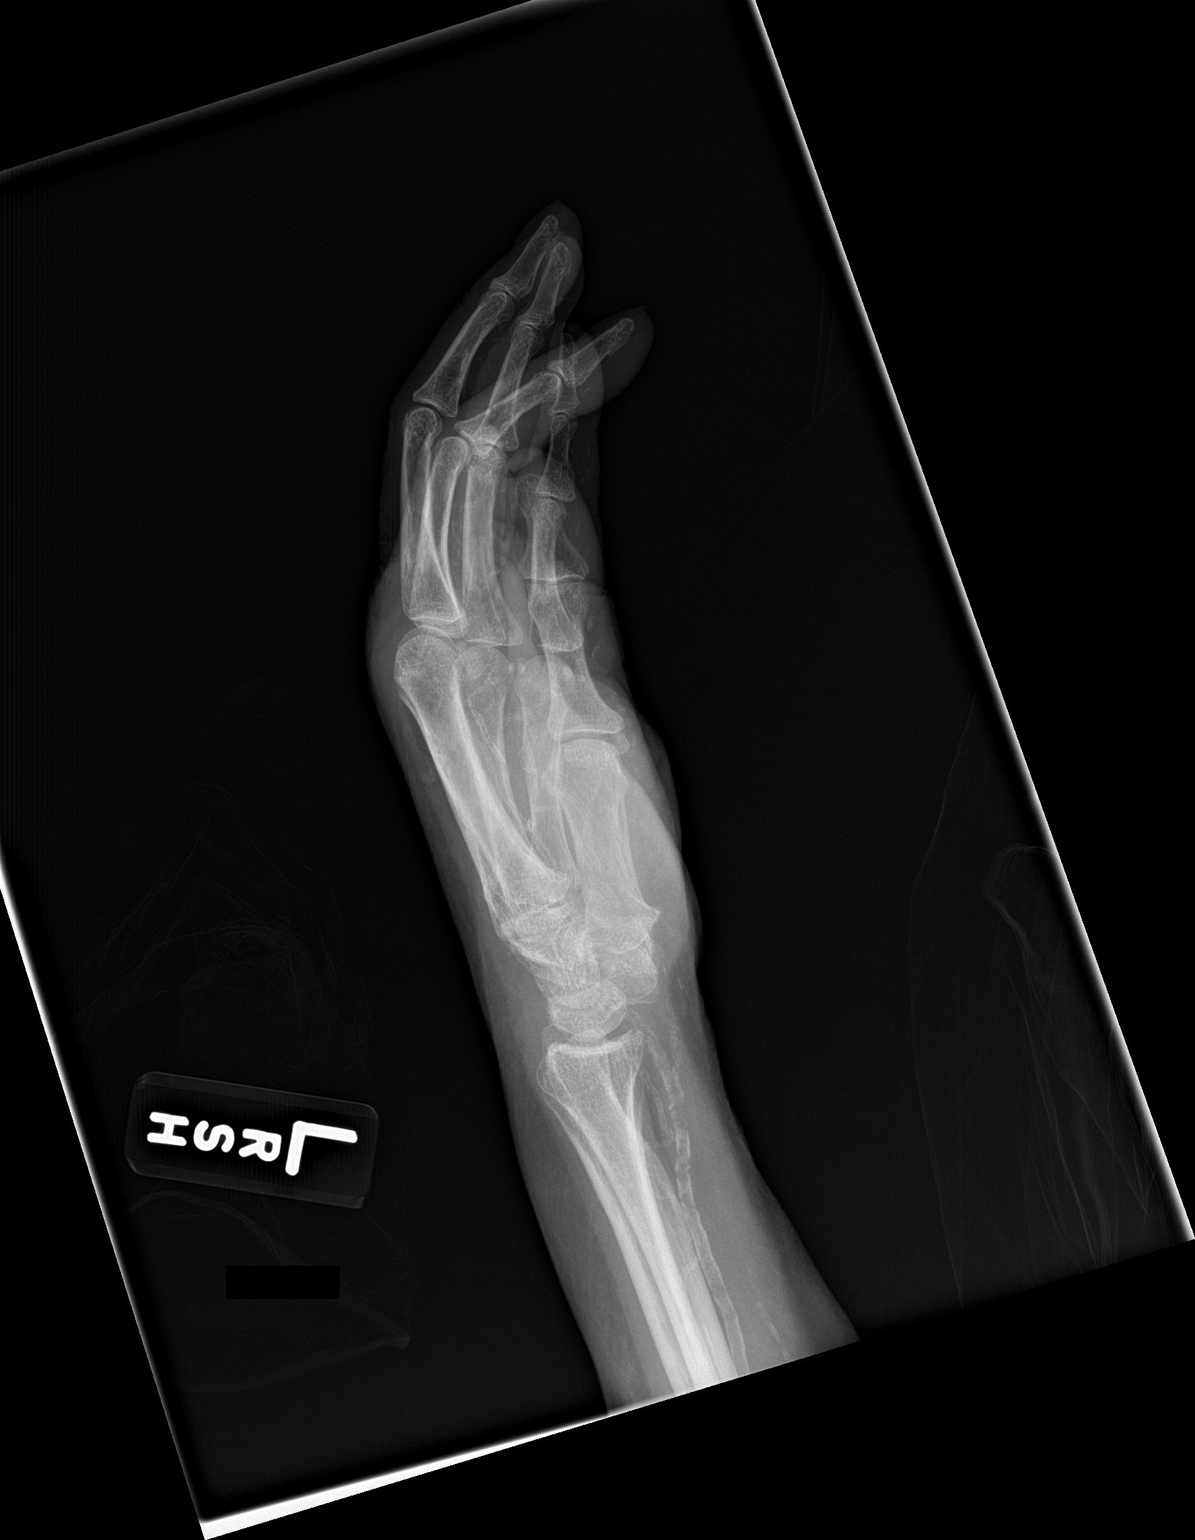

[3 of 3 positions shown; findings below may reference images not displayed]

FINDINGS: No fracture or dislocation of the left hand. Joint spaces are
preserved. There is extensive vascular calcinosis about the hand and
wrist. There is no specific radiographic soft tissue abnormality of
the index digit.
IMPRESSION: No fracture or dislocation of the left hand. Joint spaces are
preserved. There is extensive vascular calcinosis about the hand and
wrist. There is no specific radiographic soft tissue abnormality of
the index digit.

## 2020-02-23 IMAGING — DX DG PELVIS 1-2V
1 series · 1 of 1 positions shown · non-contrast
Comparison: Hip radiograph 03/06/2016

CLINICAL DATA: Decubitus ulcer, assess for osteomyelitis

EXAM:
PELVIS - 1-2 VIEW

[pelvis ap]
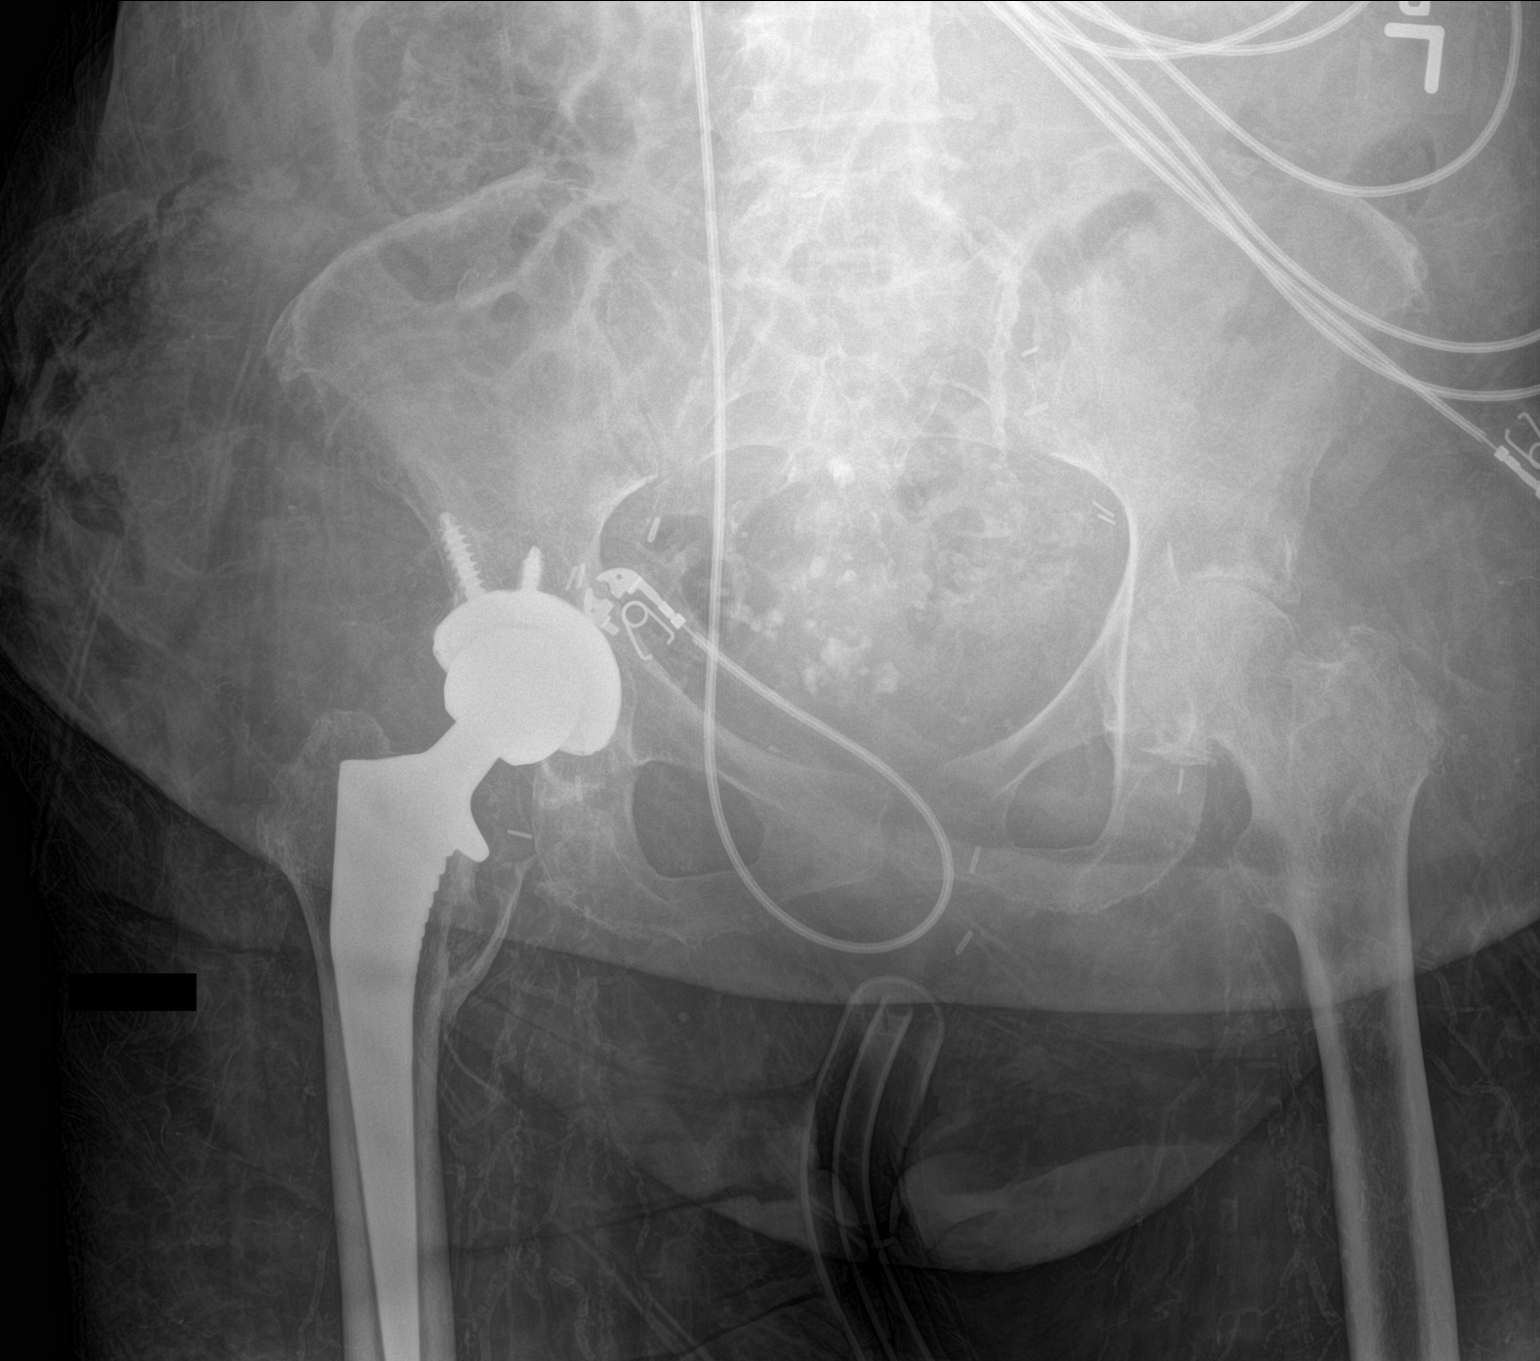

[1 of 1 positions shown; findings below may reference images not displayed]

FINDINGS: Total right hip arthroplasty. Advanced left hip osteoarthritis with
joint narrowing. Generalized osteopenia. Gas, stool, pelvic
calcifications, structure overlap, and osteopenia are all limiting
factors in detecting osteomyelitis.
IMPRESSION: 1. This modality is insufficient for detecting osteomyelitis. MRI or
CT without contrast (less sensitive) would be needed to further
assess.
2. Advanced left hip osteoarthritis.
